# Patient Record
Sex: Male | Born: 1960
Health system: Southern US, Community
[De-identification: ages and names within clinical notes are randomized; demographics above are authoritative.]

## PROBLEM LIST (undated history)

## (undated) DIAGNOSIS — I4891 Unspecified atrial fibrillation: Principal | ICD-10-CM

## (undated) DIAGNOSIS — R918 Other nonspecific abnormal finding of lung field: Secondary | ICD-10-CM

## (undated) DIAGNOSIS — Z720 Tobacco use: Secondary | ICD-10-CM

## (undated) DIAGNOSIS — R0600 Dyspnea, unspecified: Secondary | ICD-10-CM

## (undated) DIAGNOSIS — R911 Solitary pulmonary nodule: Secondary | ICD-10-CM

## (undated) DIAGNOSIS — J449 Chronic obstructive pulmonary disease, unspecified: Secondary | ICD-10-CM

## (undated) DIAGNOSIS — F1010 Alcohol abuse, uncomplicated: Secondary | ICD-10-CM

## (undated) DIAGNOSIS — G8929 Other chronic pain: Secondary | ICD-10-CM

## (undated) DIAGNOSIS — J189 Pneumonia, unspecified organism: Secondary | ICD-10-CM

## (undated) DIAGNOSIS — M549 Dorsalgia, unspecified: Secondary | ICD-10-CM

## (undated) HISTORY — PX: WISDOM TOOTH EXTRACTION: SHX21

## (undated) HISTORY — DX: Other nonspecific abnormal finding of lung field: R91.8

## (undated) HISTORY — DX: Solitary pulmonary nodule: R91.1

## (undated) HISTORY — PX: APPENDECTOMY: SHX54

---

## 2001-08-09 ENCOUNTER — Emergency Department (HOSPITAL_COMMUNITY): Admission: EM | Admit: 2001-08-09 | Discharge: 2001-08-09 | Payer: Self-pay | Admitting: Emergency Medicine

## 2004-05-25 ENCOUNTER — Emergency Department (HOSPITAL_COMMUNITY): Admission: EM | Admit: 2004-05-25 | Discharge: 2004-05-25 | Payer: Self-pay | Admitting: Emergency Medicine

## 2005-04-25 ENCOUNTER — Emergency Department (HOSPITAL_COMMUNITY): Admission: EM | Admit: 2005-04-25 | Discharge: 2005-04-25 | Payer: Self-pay | Admitting: Emergency Medicine

## 2005-05-04 ENCOUNTER — Ambulatory Visit: Payer: Self-pay | Admitting: Internal Medicine

## 2005-05-26 ENCOUNTER — Ambulatory Visit: Payer: Self-pay | Admitting: *Deleted

## 2005-07-10 ENCOUNTER — Emergency Department (HOSPITAL_COMMUNITY): Admission: EM | Admit: 2005-07-10 | Discharge: 2005-07-10 | Payer: Self-pay | Admitting: Emergency Medicine

## 2014-06-12 ENCOUNTER — Emergency Department (HOSPITAL_COMMUNITY)
Admission: EM | Admit: 2014-06-12 | Discharge: 2014-06-12 | Disposition: A | Discharge status: Home / Self Care | Payer: Self-pay | Attending: Emergency Medicine | Admitting: Emergency Medicine

## 2014-06-12 ENCOUNTER — Encounter (HOSPITAL_COMMUNITY): Payer: Self-pay | Admitting: Emergency Medicine

## 2014-06-12 DIAGNOSIS — G8929 Other chronic pain: Secondary | ICD-10-CM | POA: Insufficient documentation

## 2014-06-12 DIAGNOSIS — Z720 Tobacco use: Secondary | ICD-10-CM | POA: Insufficient documentation

## 2014-06-12 DIAGNOSIS — M62830 Muscle spasm of back: Secondary | ICD-10-CM | POA: Insufficient documentation

## 2014-06-12 HISTORY — DX: Dorsalgia, unspecified: M54.9

## 2014-06-12 HISTORY — DX: Other chronic pain: G89.29

## 2014-06-12 MED ORDER — ACETAMINOPHEN 325 MG PO TABS
650.0000 mg | ORAL_TABLET | Freq: Once | ORAL | Status: AC
  Administered 2014-06-12: 650 mg via ORAL
  Filled 2014-06-12: qty 2

## 2014-06-12 MED ORDER — CYCLOBENZAPRINE HCL 10 MG PO TABS
5.0000 mg | ORAL_TABLET | Freq: Once | ORAL | Status: AC
  Administered 2014-06-12: 5 mg via ORAL
  Filled 2014-06-12: qty 1

## 2014-06-12 MED ORDER — MORPHINE SULFATE 4 MG/ML IJ SOLN
4.0000 mg | Freq: Once | INTRAMUSCULAR | Status: AC
  Administered 2014-06-12: 4 mg via INTRAMUSCULAR
  Filled 2014-06-12: qty 1

## 2014-06-12 MED ORDER — IBUPROFEN 800 MG PO TABS: 800.0000 mg | ORAL_TABLET | Freq: Three times a day (TID) | ORAL | Status: DC

## 2014-06-12 MED ORDER — CYCLOBENZAPRINE HCL 5 MG PO TABS: 10.0000 mg | ORAL_TABLET | Freq: Two times a day (BID) | ORAL | Status: DC | PRN

## 2014-06-12 MED ORDER — OXYCODONE-ACETAMINOPHEN 5-325 MG PO TABS: 1.0000 | ORAL_TABLET | Freq: Four times a day (QID) | ORAL | Status: DC | PRN

## 2014-06-12 MED ORDER — ONDANSETRON 4 MG PO TBDP
4.0000 mg | ORAL_TABLET | Freq: Once | ORAL | Status: AC
  Administered 2014-06-12: 4 mg via ORAL
  Filled 2014-06-12: qty 1

## 2014-06-12 NOTE — ED Notes (Signed)
Pt states that he has chronic low back pain x 10 years.  "I helped someone move last week and it got worse".

## 2014-06-12 NOTE — Discharge Instructions (Signed)
Heat Therapy Heat therapy can help ease sore, stiff, injured, and tight muscles and joints. Heat relaxes your muscles, which may help ease your pain.  RISKS AND COMPLICATIONS If you have any of the following conditions, do not use heat therapy unless your health care provider has approved:  Poor circulation.  Healing wounds or scarred skin in the area being treated.  Diabetes, heart disease, or high blood pressure.  Not being able to feel (numbness) the area being treated.  Unusual swelling of the area being treated.  Active infections.  Blood clots.  Cancer.  Inability to communicate pain. This may include young children and people who have problems with their brain function (dementia).  Pregnancy. Heat therapy should only be used on old, pre-existing, or long-lasting (chronic) injuries. Do not use heat therapy on new injuries unless directed by your health care provider. HOW TO USE HEAT THERAPY There are several different kinds of heat therapy, including:  Moist heat pack.  Warm water bath.  Hot water bottle.  Electric heating pad.  Heated gel pack.  Heated wrap.  Electric heating pad. Use the heat therapy method suggested by your health care provider. Follow your health care provider's instructions on when and how to use heat therapy. GENERAL HEAT THERAPY RECOMMENDATIONS  Do not sleep while using heat therapy. Only use heat therapy while you are awake.  Your skin may turn pink while using heat therapy. Do not use heat therapy if your skin turns red.  Do not use heat therapy if you have new pain.  High heat or long exposure to heat can cause burns. Be careful when using heat therapy to avoid burning your skin.  Do not use heat therapy on areas of your skin that are already irritated, such as with a rash or sunburn. SEEK MEDICAL CARE IF:  You have blisters, redness, swelling, or numbness.  You have new pain.  Your pain is worse. MAKE SURE  YOU:  Understand these instructions.  Will watch your condition.  Will get help right away if you are not doing well or get worse. Document Released: 09/20/2011 Document Revised: 11/12/2013 Document Reviewed: 08/21/2013 [facility] Patient Information 2015 [place], [place]. This information is not intended to replace advice given to you by your health care provider. Make sure you discuss any questions you have with your health care provider.  Muscle Cramps and Spasms Muscle cramps and spasms occur when a muscle or muscles tighten and you have no control over this tightening (involuntary muscle contraction). They are a common problem and can develop in any muscle. The most common place is in the calf muscles of the leg. Both muscle cramps and muscle spasms are involuntary muscle contractions, but they also have differences:   Muscle cramps are sporadic and painful. They may last a few seconds to a quarter of an hour. Muscle cramps are often more forceful and last longer than muscle spasms.  Muscle spasms may or may not be painful. They may also last just a few seconds or much longer. CAUSES  It is uncommon for cramps or spasms to be due to a serious underlying problem. In many cases, the cause of cramps or spasms is unknown. Some common causes are:   Overexertion.   Overuse from repetitive motions (doing the same thing over and over).   Remaining in a certain position for a long period of time.   Improper preparation, form, or technique while performing a sport or activity.   Dehydration.   Injury.  Side effects of some medicines.   Abnormally low levels of the salts and ions in your blood (electrolytes), especially potassium and calcium. This could happen if you are taking water pills (diuretics) or you are pregnant.  Some underlying medical problems can make it more likely to develop cramps or spasms. These include, but are not limited to:   Diabetes.   Parkinson disease.    Hormone disorders, such as thyroid problems.   Alcohol abuse.   Diseases specific to muscles, joints, and bones.   Blood vessel disease where not enough blood is getting to the muscles.  HOME CARE INSTRUCTIONS   Stay well hydrated. Drink enough water and fluids to keep your urine clear or pale yellow.  It may be helpful to massage, stretch, and relax the affected muscle.  For tight or tense muscles, use a warm towel, heating pad, or hot shower water directed to the affected area.  If you are sore or have pain after a cramp or spasm, applying ice to the affected area may relieve discomfort.  Put ice in a plastic bag.  Place a towel between your skin and the bag.  Leave the ice on for 15-20 minutes, 03-04 times a day.  Medicines used to treat a known cause of cramps or spasms may help reduce their frequency or severity. Only take over-the-counter or prescription medicines as directed by your caregiver. SEEK MEDICAL CARE IF:  Your cramps or spasms get more severe, more frequent, or do not improve over time.  MAKE SURE YOU:   Understand these instructions.  Will watch your condition.  Will get help right away if you are not doing well or get worse. Document Released: 12/18/2001 Document Revised: 10/23/2012 Document Reviewed: 06/14/2012 [facility] Patient Information 2015 [place], [place]. This information is not intended to replace advice given to you by your health care provider. Make sure you discuss any questions you have with your health care provider. Back Pain, Adult Low back pain is very common. About 1 in 5 people have back pain.The cause of low back pain is rarely dangerous. The pain often gets better over time.About half of people with a sudden onset of back pain feel better in just 2 weeks. About 8 in 10 people feel better by 6 weeks.  CAUSES Some common causes of back pain include:  Strain of the muscles or ligaments supporting the spine.  Wear and tear  (degeneration) of the spinal discs.  Arthritis.  Direct injury to the back. DIAGNOSIS Most of the time, the direct cause of low back pain is not known.However, back pain can be treated effectively even when the exact cause of the pain is unknown.Answering your caregiver's questions about your overall health and symptoms is one of the most accurate ways to make sure the cause of your pain is not dangerous. If your caregiver needs more information, he or she may order lab work or imaging tests (X-rays or MRIs).However, even if imaging tests show changes in your back, this usually does not require surgery. HOME CARE INSTRUCTIONS For many people, back pain returns.Since low back pain is rarely dangerous, it is often a condition that people can learn to [facility] their own.   Remain active. It is stressful on the back to sit or stand in one place. Do not sit, drive, or stand in one place for more than 30 minutes at a time. Take short walks on level surfaces as soon as pain allows.Try to increase the length of time you walk  each day.  Do not stay in bed.Resting more than 1 or 2 days can delay your recovery.  Do not avoid exercise or work.Your body is made to move.It is not dangerous to be active, even though your back may hurt.Your back will likely heal faster if you return to being active before your pain is gone.  Pay attention to your body when you bend and lift. Many people have less discomfortwhen lifting if they bend their knees, keep the load close to their bodies,and avoid twisting. Often, the most comfortable positions are those that put less stress on your recovering back.  Find a comfortable position to sleep. Use a firm mattress and lie on your side with your knees slightly bent. If you lie on your back, put a pillow under your knees.  Only take over-the-counter or prescription medicines as directed by your caregiver. Over-the-counter medicines to reduce pain and inflammation  are often the most helpful.Your caregiver may prescribe muscle relaxant drugs.These medicines help dull your pain so you can more quickly return to your normal activities and healthy exercise.  Put ice on the injured area.  Put ice in a plastic bag.  Place a towel between your skin and the bag.  Leave the ice on for 15-20 minutes, 03-04 times a day for the first 2 to 3 days. After that, ice and heat may be alternated to reduce pain and spasms.  Ask your caregiver about trying back exercises and gentle massage. This may be of some benefit.  Avoid feeling anxious or stressed.Stress increases muscle tension and can worsen back pain.It is important to recognize when you are anxious or stressed and learn ways to manage it.Exercise is a great option. SEEK MEDICAL CARE IF:  You have pain that is not relieved with rest or medicine.  You have pain that does not improve in 1 week.  You have new symptoms.  You are generally not feeling well. SEEK IMMEDIATE MEDICAL CARE IF:   You have pain that radiates from your back into your legs.  You develop new bowel or bladder control problems.  You have unusual weakness or numbness in your arms or legs.  You develop nausea or vomiting.  You develop abdominal pain.  You feel faint. Document Released: 06/28/2005 Document Revised: 12/28/2011 Document Reviewed: 10/30/2013 [facility] Patient Information 2015 [place], [place]. This information is not intended to replace advice given to you by your health care provider. Make sure you discuss any questions you have with your health care provider.

## 2014-06-12 NOTE — ED Provider Notes (Signed)
CSN: [id number]     Arrival date & time 06/12/14  1016 History  This chart was scribed for non-physician practitioner, Delos Haring, PA-C working with Virgel Manifold, MD by Frederich Balding, ED scribe. This patient was seen in room WTR8/WTR8 and the patient's care was started at 12:03 PM.   Chief Complaint  Patient presents with  . Back Pain   The history is provided by the patient. No language interpreter was used.    HPI Comments: Tyler [name] is a 53 y.o. male who presents to the Emergency Department complaining of chronic lower back pain that worsened one week ago. Denies fall or injury but states he was doing heavy lifting to help someone move the day before the pain started. Pt does not normally use a cane to ambulate but has been using one due to pain. Movements worsen the pain and cause it to radiate into his right thigh. He has taken ibuprofen with no relief. Denies bowel or bladder incontinence, weakness, numbness.   Past Medical History  Diagnosis Date  . Chronic back pain    History reviewed. No pertinent past surgical history. History reviewed. No pertinent family history. History  Substance Use Topics  . Smoking status: Current Every Day Smoker  . Smokeless tobacco: Not on file  . Alcohol Use: Yes    Review of Systems  Genitourinary:       Negative for bowel or bladder incontinence.  Musculoskeletal: Positive for back pain.  Neurological: Negative for weakness and numbness.  All other systems reviewed and are negative.  Allergies  Review of patient's allergies indicates no known allergies.  Home Medications   Prior to Admission medications   Medication Sig Start Date End Date Taking? Authorizing Provider  cyclobenzaprine (FLEXERIL) 5 MG tablet Take 2 tablets (10 mg total) by mouth 2 (two) times daily as needed for muscle spasms. 06/12/14   Aaryan Essman Marilu Favre, PA-C  ibuprofen (ADVIL,MOTRIN) 800 MG tablet Take 1 tablet (800 mg total) by mouth 3 (three) times daily.  06/12/14   Dauntae Derusha Marilu Favre, PA-C  oxyCODONE-acetaminophen (PERCOCET/ROXICET) 5-325 MG per tablet Take 1 tablet by mouth every 6 (six) hours as needed for severe pain. 06/12/14   Cheyne Boulden Marilu Favre, PA-C   BP 146/73 mmHg  Pulse 73  Temp(Src) 98.1 F (36.7 C) (Oral)  Resp 16  SpO2 100%   Physical Exam  Constitutional: He is oriented to person, place, and time. He appears well-developed and well-nourished. No distress.  HENT:  Head: Normocephalic and atraumatic.  Eyes: Conjunctivae and EOM are normal.  Neck: Neck supple. No tracheal deviation present.  Cardiovascular: Normal rate.   Pulmonary/Chest: Effort normal. No respiratory distress.  Musculoskeletal: Normal range of motion.  Initial exam: Neurosensory function adequate to both legs No clonus on dorsiflextion Skin color is normal. Skin is warm and moist.  I see no step off deformity, no midline bony tenderness.  Pt is able to ambulate with a cane  No crepitus, laceration, effusion, induration, lesions, swelling.   Pedal pulses are symmetrical and palpable bilaterally  moderate tenderness to palpation of right side paraspinel muscles Pt has decreased strength to right lower extremity due to pain . Re-examination:  patient is able to ambulate without cane normally. He has symmetrical strengths to bilateral lower extremities.  Neurological: He is alert and oriented to person, place, and time.  Skin: Skin is warm and dry.  Psychiatric: He has a normal mood and affect. His behavior is normal.  Nursing note and vitals  reviewed.   ED Course  Procedures (including critical care time)  DIAGNOSTIC STUDIES: Oxygen Saturation is 100% on RA, normal by my interpretation.    COORDINATION OF CARE: 12:07 PM-Discussed treatment plan which includes pain medication, a muscle relaxer and an anti-inflammatory with pt at bedside and pt agreed to plan.   Labs Review Labs Reviewed - No data to display  Imaging Review No results found.    EKG Interpretation None      MDM   Final diagnoses:  Muscle spasm of back    Medications  morphine 4 MG/ML injection 4 mg (4 mg Intramuscular Given 06/12/14 1221)  cyclobenzaprine (FLEXERIL) tablet 5 mg (5 mg Oral Given 06/12/14 1221)  acetaminophen (TYLENOL) tablet 650 mg (650 mg Oral Given 06/12/14 1220)  ondansetron (ZOFRAN-ODT) disintegrating tablet 4 mg (4 mg Oral Given 06/12/14 1221)   After medication the pain no longer needed to walk with a cane and was feeling significantly better. No longer having weakness in the right leg.  53 y.o.Tyler [name] Tyler [name]'s  with back pain. No neurological deficits and normal neuro exam. Patient can walk. No loss of bowel or bladder control. No concern for cauda equina at this time base on HPI and physical exam findings. No fever, night sweats, weight loss, h/o cancer, IVDU.   RICE protocol and pain medicine indicated and discussed with patient.   Patient Plan 1. Medications: pain medication and muscle relaxer. Cont usual home medications unless otherwise directed. 2. Treatment: rest, drink plenty of fluids, gentle stretching as discussed, alternate ice and heat  3. Follow Up: Please followup with your primary doctor for discussion of your diagnoses and further evaluation after today's visit; if you do not have a primary care doctor use the resource guide provided to find one  Advised to follow-up with the orthopedist if symptoms do not start to resolve in the next 2-3 days. If develop loss of bowel or urinary control return to the ED as soon as possible for further evaluation. To take the medications as prescribed as they can cause harm if not taken appropriately.   Vital signs are stable at discharge. Filed Vitals:   06/12/14 1028  BP: 146/73  Pulse: 73  Temp: 98.1 F (36.7 C)  Resp: 16    Patient/guardian has voiced understanding and agreed to follow-up with the PCP or specialist.       I personally performed the services described in  this documentation, which was scribed in my presence. The recorded information has been reviewed and is accurate.  Linus Mako, PA-C 06/12/14 1312  Virgel Manifold, MD 06/12/14 [phone]

## 2015-07-10 ENCOUNTER — Encounter (HOSPITAL_COMMUNITY): Payer: Self-pay | Admitting: Emergency Medicine

## 2015-07-10 ENCOUNTER — Emergency Department (HOSPITAL_COMMUNITY)
Admission: EM | Admit: 2015-07-10 | Discharge: 2015-07-10 | Disposition: A | Discharge status: Home / Self Care | Payer: Self-pay | Attending: Emergency Medicine | Admitting: Emergency Medicine

## 2015-07-10 DIAGNOSIS — Z791 Long term (current) use of non-steroidal anti-inflammatories (NSAID): Secondary | ICD-10-CM | POA: Insufficient documentation

## 2015-07-10 DIAGNOSIS — H9211 Otorrhea, right ear: Secondary | ICD-10-CM | POA: Insufficient documentation

## 2015-07-10 DIAGNOSIS — F17200 Nicotine dependence, unspecified, uncomplicated: Secondary | ICD-10-CM | POA: Insufficient documentation

## 2015-07-10 DIAGNOSIS — H66011 Acute suppurative otitis media with spontaneous rupture of ear drum, right ear: Secondary | ICD-10-CM | POA: Insufficient documentation

## 2015-07-10 DIAGNOSIS — G8929 Other chronic pain: Secondary | ICD-10-CM | POA: Insufficient documentation

## 2015-07-10 MED ORDER — OFLOXACIN 0.3 % OP SOLN
5.0000 [drp] | Freq: Every day | OPHTHALMIC | Status: DC
  Administered 2015-07-10: 5 [drp] via OTIC
  Filled 2015-07-10: qty 5

## 2015-07-10 MED ORDER — AMOXICILLIN 875 MG PO TABS: 875.0000 mg | ORAL_TABLET | Freq: Two times a day (BID) | ORAL | Status: DC

## 2015-07-10 NOTE — Discharge Instructions (Signed)
Please read and follow all provided instructions.  Your diagnoses today include:  1. Otorrhea of right ear    Tests performed today include:  Vital signs. See below for your results today.   Medications prescribed:   Ofloxacin (ear drops) - Use 10 drops in affected ear twice a day for 14 days   Amoxicillin - antibiotic  You have been prescribed an antibiotic medicine: take the entire course of medicine even if you are feeling better. Stopping early can cause the antibiotic not to work.   Take any prescribed medications only as directed. Treatment for your infection is aimed at treating the symptoms. There are no medications, such as antibiotics, that will cure your infection.   Home care instructions:  Follow any educational materials contained in this packet.   Follow-up instructions: Please follow-up with the ear doctor referral listed in the next 5 days to have your ear rechecked.  Return instructions:   Please return to the Emergency Department if you experience worsening symptoms.   RETURN IMMEDIATELY IF you develop shortness of breath, confusion or altered mental status, a new rash, become dizzy, faint, or poorly responsive, or are unable to be cared for at home.  Please return if you have persistent vomiting and cannot keep down fluids or develop a fever that is not controlled by tylenol or motrin.    Please return if you have any other emergent concerns.  Additional Information:  Your vital signs today were: BP 124/86 mmHg   Pulse 77   Temp(Src) 98.3 F (36.8 C) (Oral)   Resp 14   SpO2 100% If your blood pressure (BP) was elevated above 135/85 this visit, please have this repeated by your doctor within one month. --------------

## 2015-07-10 NOTE — ED Provider Notes (Signed)
CSN: [id number]     Arrival date & time 07/10/15  3532 History   First MD Initiated Contact with Patient 07/10/15 1021     Chief Complaint  Patient presents with  . Ear Drainage     (Consider location/radiation/quality/duration/timing/severity/associated sxs/prior Treatment) HPI Comments: Patient presents with complaint of right-sided hearing loss and otorrhea starting yesterday morning. Patient was out raking leaves and all of a sudden felt dripping from his right ear. He used a cotton ball and found that the drainage was mostly blood. Patient noted decrease in hearing about that time. No significant pain, fever, URI symptoms. No treatments for this. Patient denies trauma to the head or ear. Onset of symptoms acute. Course is constant. Nothing makes symptoms better or worse.   The history is provided by the patient.    Past Medical History  Diagnosis Date  . Chronic back pain    History reviewed. No pertinent past surgical history. History reviewed. No pertinent family history. Social History  Substance Use Topics  . Smoking status: Current Every Day Smoker  . Smokeless tobacco: None  . Alcohol Use: Yes    Review of Systems  Constitutional: Negative for fever.  HENT: Positive for ear discharge (blood) and hearing loss. Negative for congestion, rhinorrhea and sore throat.   Eyes: Negative for redness.  Respiratory: Negative for cough.   Cardiovascular: Negative for chest pain.  Gastrointestinal: Negative for nausea, vomiting, abdominal pain and diarrhea.  Genitourinary: Negative for dysuria.  Musculoskeletal: Negative for myalgias.  Skin: Negative for rash.  Neurological: Negative for headaches.      Allergies  Review of patient's allergies indicates no known allergies.  Home Medications   Prior to Admission medications   Medication Sig Start Date End Date Taking? Authorizing Provider  cyclobenzaprine (FLEXERIL) 5 MG tablet Take 2 tablets (10 mg total) by mouth 2  (two) times daily as needed for muscle spasms. 06/12/14   Tiffany Carlota Raspberry, PA-C  ibuprofen (ADVIL,MOTRIN) 800 MG tablet Take 1 tablet (800 mg total) by mouth 3 (three) times daily. 06/12/14   Delos Haring, PA-C  oxyCODONE-acetaminophen (PERCOCET/ROXICET) 5-325 MG per tablet Take 1 tablet by mouth every 6 (six) hours as needed for severe pain. 06/12/14   Tiffany Carlota Raspberry, PA-C   BP 124/86 mmHg  Pulse 77  Temp(Src) 98.3 F (36.8 C) (Oral)  Resp 14  SpO2 100%   Physical Exam  Constitutional: He appears well-developed and well-nourished.  HENT:  Head: Normocephalic and atraumatic.  Right Ear: Tympanic membrane is bulging.  Left Ear: Tympanic membrane, external ear and ear canal normal.  Nose: Nose normal. No mucosal edema or rhinorrhea.  Mouth/Throat: Uvula is midline, oropharynx is clear and moist and mucous membranes are normal. Mucous membranes are not dry. No trismus in the jaw. No uvula swelling. No oropharyngeal exudate, posterior oropharyngeal edema, posterior oropharyngeal erythema or tonsillar abscesses.  Dried blood and debris within the right ear canal with mild edema. The TM that I see, appears bulging, with purulent fluid. I cannot fully assess for perforation. Technically difficult exam.  Eyes: Conjunctivae are normal. Right eye exhibits no discharge. Left eye exhibits no discharge.  Neck: Normal range of motion. Neck supple.  Cardiovascular: Normal rate, regular rhythm and normal heart sounds.   Pulmonary/Chest: Effort normal and breath sounds normal. No respiratory distress. He has no wheezes. He has no rales.  Abdominal: Soft. There is no tenderness.  Neurological: He is alert.  Skin: Skin is warm and dry.  Psychiatric: He has a normal  mood and affect.  Nursing note and vitals reviewed.   ED Course  Procedures (including critical care time) Labs Review Labs Reviewed - No data to display  Imaging Review No results found. I have personally reviewed and evaluated these  images and lab results as part of my medical decision-making.   EKG Interpretation None       10:51 AM Patient has not yet been roomed.   Vital signs reviewed and are as follows: BP 124/86 mmHg  Pulse 77  Temp(Src) 98.3 F (36.8 C) (Oral)  Resp 14  SpO2 100%  11:07 AM Patient seen and examined. Medications ordered.   Will treat for otitis with amoxicillin. As I cannot rule out perforation, will also cover with ofloxacin.   ENT follow-up given and patient encouraged to follow-up to ensure resolution of current symptoms.    MDM   Final diagnoses:  Otorrhea of right ear  Acute suppurative otitis media of right ear with spontaneous rupture of tympanic membrane, recurrence not specified   Patient with exam most consistent with suppurative otitis media with perforation. This would explain otorrhea and decreased hearing. Patient denies trauma. Otherwise appears well. No history of diabetes. Do not suspect malignant otitis externa.   Carlisle Cater, PA-C 07/10/15 [place], MD 07/12/15 [phone]

## 2015-07-10 NOTE — ED Notes (Addendum)
Pt c/o right ear pain and bleeding, headache onset 1100 yesterday. Dried gelatinous blood present to inner ear obstructing clear view of tympanic membrane. No current fresh drainage of fluid or blood visualized. Denies trauma.

## 2017-08-03 ENCOUNTER — Emergency Department (HOSPITAL_COMMUNITY): Payer: Self-pay

## 2017-08-03 ENCOUNTER — Inpatient Hospital Stay (HOSPITAL_COMMUNITY)
Admission: EM | Admit: 2017-08-03 | Discharge: 2017-08-11 | DRG: 193 | Disposition: A | Discharge status: Home / Self Care | Payer: Self-pay | Attending: Internal Medicine | Admitting: Internal Medicine

## 2017-08-03 ENCOUNTER — Encounter (HOSPITAL_COMMUNITY): Payer: Self-pay | Admitting: Family Medicine

## 2017-08-03 DIAGNOSIS — J1000 Influenza due to other identified influenza virus with unspecified type of pneumonia: Principal | ICD-10-CM | POA: Diagnosis present

## 2017-08-03 DIAGNOSIS — I471 Supraventricular tachycardia: Secondary | ICD-10-CM | POA: Diagnosis present

## 2017-08-03 DIAGNOSIS — J8489 Other specified interstitial pulmonary diseases: Secondary | ICD-10-CM | POA: Diagnosis present

## 2017-08-03 DIAGNOSIS — F17210 Nicotine dependence, cigarettes, uncomplicated: Secondary | ICD-10-CM | POA: Diagnosis present

## 2017-08-03 DIAGNOSIS — G8929 Other chronic pain: Secondary | ICD-10-CM | POA: Diagnosis present

## 2017-08-03 DIAGNOSIS — R0902 Hypoxemia: Secondary | ICD-10-CM | POA: Diagnosis present

## 2017-08-03 DIAGNOSIS — S27309A Unspecified injury of lung, unspecified, initial encounter: Secondary | ICD-10-CM

## 2017-08-03 DIAGNOSIS — J440 Chronic obstructive pulmonary disease with acute lower respiratory infection: Secondary | ICD-10-CM | POA: Diagnosis present

## 2017-08-03 DIAGNOSIS — T380X5A Adverse effect of glucocorticoids and synthetic analogues, initial encounter: Secondary | ICD-10-CM | POA: Diagnosis not present

## 2017-08-03 DIAGNOSIS — D72829 Elevated white blood cell count, unspecified: Secondary | ICD-10-CM | POA: Diagnosis not present

## 2017-08-03 DIAGNOSIS — J111 Influenza due to unidentified influenza virus with other respiratory manifestations: Secondary | ICD-10-CM

## 2017-08-03 DIAGNOSIS — E871 Hypo-osmolality and hyponatremia: Secondary | ICD-10-CM | POA: Diagnosis present

## 2017-08-03 DIAGNOSIS — J9601 Acute respiratory failure with hypoxia: Secondary | ICD-10-CM | POA: Diagnosis present

## 2017-08-03 DIAGNOSIS — E872 Acidosis: Secondary | ICD-10-CM | POA: Diagnosis present

## 2017-08-03 DIAGNOSIS — A084 Viral intestinal infection, unspecified: Secondary | ICD-10-CM | POA: Diagnosis present

## 2017-08-03 DIAGNOSIS — R945 Abnormal results of liver function studies: Secondary | ICD-10-CM | POA: Diagnosis present

## 2017-08-03 DIAGNOSIS — J189 Pneumonia, unspecified organism: Secondary | ICD-10-CM

## 2017-08-03 DIAGNOSIS — J101 Influenza due to other identified influenza virus with other respiratory manifestations: Secondary | ICD-10-CM | POA: Diagnosis present

## 2017-08-03 DIAGNOSIS — R197 Diarrhea, unspecified: Secondary | ICD-10-CM | POA: Diagnosis present

## 2017-08-03 DIAGNOSIS — I071 Rheumatic tricuspid insufficiency: Secondary | ICD-10-CM | POA: Diagnosis present

## 2017-08-03 DIAGNOSIS — F1010 Alcohol abuse, uncomplicated: Secondary | ICD-10-CM | POA: Diagnosis present

## 2017-08-03 DIAGNOSIS — E43 Unspecified severe protein-calorie malnutrition: Secondary | ICD-10-CM | POA: Diagnosis present

## 2017-08-03 DIAGNOSIS — E860 Dehydration: Secondary | ICD-10-CM | POA: Diagnosis present

## 2017-08-03 DIAGNOSIS — J181 Lobar pneumonia, unspecified organism: Secondary | ICD-10-CM

## 2017-08-03 DIAGNOSIS — R296 Repeated falls: Secondary | ICD-10-CM | POA: Diagnosis present

## 2017-08-03 DIAGNOSIS — Z8249 Family history of ischemic heart disease and other diseases of the circulatory system: Secondary | ICD-10-CM

## 2017-08-03 DIAGNOSIS — Z9981 Dependence on supplemental oxygen: Secondary | ICD-10-CM

## 2017-08-03 DIAGNOSIS — Z681 Body mass index (BMI) 19 or less, adult: Secondary | ICD-10-CM

## 2017-08-03 DIAGNOSIS — R000 Tachycardia, unspecified: Secondary | ICD-10-CM | POA: Diagnosis present

## 2017-08-03 HISTORY — DX: Tobacco use: Z72.0

## 2017-08-03 HISTORY — DX: Alcohol abuse, uncomplicated: F10.10

## 2017-08-03 LAB — COMPREHENSIVE METABOLIC PANEL
ALBUMIN: 4 g/dL (ref 3.5–5.0)
ALK PHOS: 69 U/L (ref 38–126)
ALT: 66 U/L — ABNORMAL HIGH (ref 17–63)
AST: 183 U/L — ABNORMAL HIGH (ref 15–41)
Anion gap: 14 (ref 5–15)
BUN: 24 mg/dL — ABNORMAL HIGH (ref 6–20)
CALCIUM: 9.4 mg/dL (ref 8.9–10.3)
CHLORIDE: 91 mmol/L — AB (ref 101–111)
CO2: 23 mmol/L (ref 22–32)
Creatinine, Ser: 1.21 mg/dL (ref 0.61–1.24)
GFR calc non Af Amer: >60 mL/min (ref >60)
GLUCOSE: 126 mg/dL — AB (ref 65–99)
Potassium: 3.5 mmol/L (ref 3.5–5.1)
Sodium: 128 mmol/L — ABNORMAL LOW (ref 135–145)
Total Bilirubin: 0.7 mg/dL (ref 0.3–1.2)
Total Protein: 8.8 g/dL — ABNORMAL HIGH (ref 6.5–8.1)

## 2017-08-03 LAB — CBC
HCT: 39.7 % (ref 39.0–52.0)
HEMOGLOBIN: 14.1 g/dL (ref 13.0–17.0)
MCH: 24.8 pg — ABNORMAL LOW (ref 26.0–34.0)
MCHC: 35.5 g/dL (ref 30.0–36.0)
MCV: 69.9 fL — AB (ref 78.0–100.0)
Platelets: 174 10*3/uL (ref 150–400)
RBC: 5.68 MIL/uL (ref 4.22–5.81)
RDW: 14.2 % (ref 11.5–15.5)
WBC: 13 10*3/uL — ABNORMAL HIGH (ref 4.0–10.5)

## 2017-08-03 LAB — INFLUENZA PANEL BY PCR (TYPE A & B)
Influenza A By PCR: POSITIVE — AB
Influenza B By PCR: NEGATIVE

## 2017-08-03 LAB — LIPASE, BLOOD: LIPASE: 62 U/L — AB (ref 11–51)

## 2017-08-03 LAB — LACTIC ACID, PLASMA: Lactic Acid, Venous: 2.4 mmol/L (ref 0.5–1.9)

## 2017-08-03 MED ORDER — DEXTROSE 5 % IV SOLN
2.0000 g | Freq: Once | INTRAVENOUS | Status: AC
  Administered 2017-08-03: 2 g via INTRAVENOUS
  Filled 2017-08-03: qty 2

## 2017-08-03 MED ORDER — SODIUM CHLORIDE 0.9 % IV BOLUS (SEPSIS)
1000.0000 mL | Freq: Once | INTRAVENOUS | Status: AC
  Administered 2017-08-03: 1000 mL via INTRAVENOUS

## 2017-08-03 MED ORDER — DEXTROSE 5 % IV SOLN
500.0000 mg | Freq: Once | INTRAVENOUS | Status: AC
  Administered 2017-08-04: 500 mg via INTRAVENOUS
  Filled 2017-08-03: qty 500

## 2017-08-03 NOTE — ED Triage Notes (Signed)
Patient reports he is experiencing flu like symptoms that started Monday. Symptoms include: body aches, unmeasured, generalized weakness, cough, and loss of appetite. Patient reports on Monday he took Nyquil with relief the first time. Also, reports nausea and vomiting.

## 2017-08-03 NOTE — ED Provider Notes (Signed)
[facility] DEPT Provider Note   CSN: [id number] Arrival date & time: 08/03/17  1719     History   Chief Complaint Chief Complaint  Patient presents with  . Influenza    HPI Tyler [name] is a 57 y.o. male.  Patient with no significant medical history presents with generalized body aches, generalized weakness causing falls, intermittent fever, congestion, cough, nausea, vomiting and diarrhea x 3 days. He has no appetite and has not been taking in fluids. He is urinating less. Family member reports multiple falls in the last 2 days secondary to weakness.    The history is provided by the patient. No language interpreter was used.  Influenza  Presenting symptoms: cough, diarrhea, fever, nausea and vomiting   Associated symptoms: chills and nasal congestion   Associated symptoms: no neck stiffness     Past Medical History:  Diagnosis Date  . Chronic back pain     There are no active problems to display for this patient.   History reviewed. No pertinent surgical history.     Home Medications    Prior to Admission medications   Medication Sig Start Date End Date Taking? Authorizing Provider  Phenyleph-Doxylamine-DM-APAP (NYQUIL SEVERE COLD/FLU) 5-6.25-10-325 MG CAPS Take 2 capsules by mouth daily as needed (COLD).   Yes [provider]  amoxicillin (AMOXIL) 875 MG tablet Take 1 tablet (875 mg total) by mouth 2 (two) times daily. Patient not taking: Reported on 08/03/2017 07/10/15   Carlisle Cater, PA-C  cyclobenzaprine (FLEXERIL) 5 MG tablet Take 2 tablets (10 mg total) by mouth 2 (two) times daily as needed for muscle spasms. Patient not taking: Reported on 08/03/2017 06/12/14   Delos Haring, PA-C  ibuprofen (ADVIL,MOTRIN) 800 MG tablet Take 1 tablet (800 mg total) by mouth 3 (three) times daily. Patient not taking: Reported on 08/03/2017 06/12/14   Delos Haring, PA-C  oxyCODONE-acetaminophen (PERCOCET/ROXICET) 5-325 MG per  tablet Take 1 tablet by mouth every 6 (six) hours as needed for severe pain. Patient not taking: Reported on 08/03/2017 06/12/14   Delos Haring, PA-C    Family History History reviewed. No pertinent family history.  Social History Social History   Tobacco Use  . Smoking status: Current Every Day Smoker    Packs/day: 0.50  . Smokeless tobacco: Never Used  Substance Use Topics  . Alcohol use: Yes    Comment: Drinks heavy daily.   . Drug use: Yes    Types: Marijuana     Allergies   Patient has no known allergies.   Review of Systems Review of Systems  Constitutional: Positive for activity change, appetite change, chills and fever.  HENT: Positive for congestion. Negative for facial swelling and trouble swallowing.   Respiratory: Positive for cough.   Cardiovascular: Negative for chest pain.  Gastrointestinal: Positive for diarrhea, nausea and vomiting.  Genitourinary: Positive for decreased urine volume.  Musculoskeletal: Positive for arthralgias. Negative for neck stiffness.  Skin: Negative for rash.  Neurological: Positive for weakness.  Psychiatric/Behavioral: Negative for confusion.     Physical Exam Updated Vital Signs BP (!) 122/95 (BP Location: Left Arm)   Pulse 79   Temp 98.5 F (36.9 C) (Oral)   Resp 20   Ht 6\' 2"  (1.88 m)   Wt 77.6 kg (171 lb)   SpO2 98%   BMI 21.96 kg/m   Physical Exam  Constitutional: He is oriented to person, place, and time. He appears well-developed and well-nourished. No distress.  Ill appearing.  HENT:  Head: Normocephalic.  Mouth/Throat: Mucous membranes are dry.  Eyes: Conjunctivae are normal.  Neck: Normal range of motion. Neck supple.  Cardiovascular: Normal rate and regular rhythm.  No murmur heard. Pulmonary/Chest: Effort normal and breath sounds normal. He has no wheezes. He has no rales.  Abdominal: Soft. Bowel sounds are normal. There is no tenderness. There is no rebound and no guarding.  Musculoskeletal:  Normal range of motion.  Neurological: He is alert and oriented to person, place, and time. No sensory deficit. He exhibits normal muscle tone. Coordination normal.  Skin: Skin is warm and dry. No rash noted.  Psychiatric: He has a normal mood and affect.  Nursing note and vitals reviewed.    ED Treatments / Results  Labs (all labs ordered are listed, but only abnormal results are displayed) Labs Reviewed  LIPASE, BLOOD - Abnormal; Notable for the following components:      Result Value   Lipase 62 (*)    All other components within normal limits  COMPREHENSIVE METABOLIC PANEL - Abnormal; Notable for the following components:   Sodium 128 (*)    Chloride 91 (*)    Glucose, Bld 126 (*)    BUN 24 (*)    Total Protein 8.8 (*)    AST 183 (*)    ALT 66 (*)    All other components within normal limits  CBC - Abnormal; Notable for the following components:   WBC 13.0 (*)    MCV 69.9 (*)    MCH 24.8 (*)    All other components within normal limits  URINALYSIS, ROUTINE W REFLEX MICROSCOPIC    EKG  EKG Interpretation None       Radiology No results found.  Procedures Procedures (including critical care time)  Medications Ordered in ED Medications  sodium chloride 0.9 % bolus 1,000 mL (not administered)     Initial Impression / Assessment and Plan / ED Course  I have reviewed the triage vital signs and the nursing notes.  Pertinent labs & imaging results that were available during my care of the patient were reviewed by me and considered in my medical decision making (see chart for details).     Patient presents with flu like symptoms and profound generalized weakness causing falls. He is a smoker. He is ill appearing.  Labs show hyponatremia (128), low chloride. Dehydration likely. No focal neurologic deficits. Differential includes influenza. Influenza test ordered as admission is felt likely. IVF's, CXR, add on labs pending.   CXR shows LLL infiltrate. Patient  started on Rocephin and Zithromax. On recheck, no vomiting. He continues to have diarrhea - nonbloody. Fluids running. VSS.  Influenza A positive. Given the multiple conditions present, weakness leading to multiple falls, dehydration, patient will be admitted for overnight observation, continued hydration and antibiotics. Discussed with hospitalist who accepts the patient for admission.  Final Clinical Impressions(s) / ED Diagnoses   Final diagnoses:  None   1. LLL infiltrate 2. Influenza A 3. Dehydration  ED Discharge Orders    None       Charlann Lange, PA-C 08/04/17 0029    Daleen Bo, MD 08/04/17 [phone]

## 2017-08-04 ENCOUNTER — Other Ambulatory Visit: Payer: Self-pay

## 2017-08-04 ENCOUNTER — Encounter (HOSPITAL_COMMUNITY): Payer: Self-pay | Admitting: Internal Medicine

## 2017-08-04 DIAGNOSIS — J189 Pneumonia, unspecified organism: Secondary | ICD-10-CM | POA: Diagnosis present

## 2017-08-04 DIAGNOSIS — E860 Dehydration: Secondary | ICD-10-CM

## 2017-08-04 DIAGNOSIS — J181 Lobar pneumonia, unspecified organism: Secondary | ICD-10-CM

## 2017-08-04 DIAGNOSIS — E871 Hypo-osmolality and hyponatremia: Secondary | ICD-10-CM

## 2017-08-04 DIAGNOSIS — R000 Tachycardia, unspecified: Secondary | ICD-10-CM

## 2017-08-04 DIAGNOSIS — J101 Influenza due to other identified influenza virus with other respiratory manifestations: Secondary | ICD-10-CM | POA: Diagnosis present

## 2017-08-04 LAB — COMPREHENSIVE METABOLIC PANEL
ALBUMIN: 2.9 g/dL — AB (ref 3.5–5.0)
ALK PHOS: 49 U/L (ref 38–126)
ALT: 46 U/L (ref 17–63)
AST: 117 U/L — AB (ref 15–41)
Anion gap: 10 (ref 5–15)
BILIRUBIN TOTAL: 0.7 mg/dL (ref 0.3–1.2)
BUN: 15 mg/dL (ref 6–20)
CALCIUM: 8.1 mg/dL — AB (ref 8.9–10.3)
CO2: 24 mmol/L (ref 22–32)
CREATININE: 0.72 mg/dL (ref 0.61–1.24)
Chloride: 97 mmol/L — ABNORMAL LOW (ref 101–111)
GFR calc Af Amer: >60 mL/min (ref >60)
GLUCOSE: 102 mg/dL — AB (ref 65–99)
Potassium: 3.4 mmol/L — ABNORMAL LOW (ref 3.5–5.1)
Sodium: 131 mmol/L — ABNORMAL LOW (ref 135–145)
TOTAL PROTEIN: 7 g/dL (ref 6.5–8.1)

## 2017-08-04 LAB — URINALYSIS, ROUTINE W REFLEX MICROSCOPIC
BACTERIA UA: NONE SEEN
BILIRUBIN URINE: NEGATIVE
Glucose, UA: NEGATIVE mg/dL
Ketones, ur: NEGATIVE mg/dL
Leukocytes, UA: NEGATIVE
Nitrite: NEGATIVE
PROTEIN: 100 mg/dL — AB
Specific Gravity, Urine: 1.019 (ref 1.005–1.030)
pH: 5 (ref 5.0–8.0)

## 2017-08-04 LAB — TROPONIN I
TROPONIN I: 0.03 ng/mL — AB (ref <0.03)
Troponin I: 0.03 ng/mL (ref <0.03)

## 2017-08-04 LAB — LACTIC ACID, PLASMA
Lactic Acid, Venous: 3.1 mmol/L (ref 0.5–1.9)
Lactic Acid, Venous: 1.3 mmol/L (ref 0.5–1.9)

## 2017-08-04 LAB — C DIFFICILE QUICK SCREEN W PCR REFLEX
C DIFFICILE (CDIFF) INTERP: NOT DETECTED
C DIFFICILE (CDIFF) TOXIN: NEGATIVE
C DIFFICLE (CDIFF) ANTIGEN: NEGATIVE

## 2017-08-04 LAB — HIV ANTIBODY (ROUTINE TESTING W REFLEX): HIV Screen 4th Generation wRfx: NONREACTIVE

## 2017-08-04 LAB — CBC WITH DIFFERENTIAL/PLATELET
BASOS ABS: 0 10*3/uL (ref 0.0–0.1)
Basophils Relative: 0 %
EOS ABS: 0 10*3/uL (ref 0.0–0.7)
Eosinophils Relative: 0 %
HEMATOCRIT: 34.7 % — AB (ref 39.0–52.0)
Hemoglobin: 12.1 g/dL — ABNORMAL LOW (ref 13.0–17.0)
LYMPHS ABS: 1.6 10*3/uL (ref 0.7–4.0)
Lymphocytes Relative: 18 %
MCH: 24.2 pg — ABNORMAL LOW (ref 26.0–34.0)
MCHC: 34.9 g/dL (ref 30.0–36.0)
MCV: 69.4 fL — ABNORMAL LOW (ref 78.0–100.0)
MONO ABS: 0.5 10*3/uL (ref 0.1–1.0)
MONOS PCT: 6 %
NEUTROS ABS: 6.8 10*3/uL (ref 1.7–7.7)
Neutrophils Relative %: 76 %
PLATELETS: 137 10*3/uL — AB (ref 150–400)
RBC: 5 MIL/uL (ref 4.22–5.81)
RDW: 14.3 % (ref 11.5–15.5)
WBC: 8.9 10*3/uL (ref 4.0–10.5)

## 2017-08-04 LAB — MAGNESIUM: Magnesium: 1.7 mg/dL (ref 1.7–2.4)

## 2017-08-04 LAB — LIPASE, BLOOD: LIPASE: 72 U/L — AB (ref 11–51)

## 2017-08-04 LAB — STREP PNEUMONIAE URINARY ANTIGEN: STREP PNEUMO URINARY ANTIGEN: NEGATIVE

## 2017-08-04 MED ORDER — LEVALBUTEROL HCL 0.63 MG/3ML IN NEBU
0.6300 mg | INHALATION_SOLUTION | Freq: Four times a day (QID) | RESPIRATORY_TRACT | Status: DC
  Administered 2017-08-04: 0.63 mg via RESPIRATORY_TRACT
  Filled 2017-08-04: qty 3

## 2017-08-04 MED ORDER — PANTOPRAZOLE SODIUM 40 MG PO TBEC
40.0000 mg | DELAYED_RELEASE_TABLET | Freq: Every day | ORAL | Status: DC
  Administered 2017-08-04 – 2017-08-11 (×9): 40 mg via ORAL
  Filled 2017-08-04 (×8): qty 1

## 2017-08-04 MED ORDER — LEVALBUTEROL HCL 0.63 MG/3ML IN NEBU: 0.6300 mg | INHALATION_SOLUTION | RESPIRATORY_TRACT | Status: DC | PRN

## 2017-08-04 MED ORDER — SODIUM CHLORIDE 0.9 % IV SOLN
INTRAVENOUS | Status: DC
  Administered 2017-08-04 – 2017-08-06 (×5): via INTRAVENOUS

## 2017-08-04 MED ORDER — AZITHROMYCIN 500 MG IV SOLR
500.0000 mg | INTRAVENOUS | Status: DC
Start: 2017-08-04 — End: 2017-08-07
  Administered 2017-08-05 – 2017-08-06 (×3): 500 mg via INTRAVENOUS
  Filled 2017-08-04 (×3): qty 500

## 2017-08-04 MED ORDER — SODIUM CHLORIDE 0.9 % IV BOLUS (SEPSIS)
1000.0000 mL | Freq: Once | INTRAVENOUS | Status: AC
  Administered 2017-08-04: 1000 mL via INTRAVENOUS

## 2017-08-04 MED ORDER — DEXTROSE 5 % IV SOLN
1.0000 g | INTRAVENOUS | Status: DC
  Administered 2017-08-04 – 2017-08-06 (×3): 1 g via INTRAVENOUS
  Filled 2017-08-04 (×3): qty 10

## 2017-08-04 MED ORDER — IPRATROPIUM BROMIDE 0.02 % IN SOLN
0.5000 mg | Freq: Three times a day (TID) | RESPIRATORY_TRACT | Status: DC
  Administered 2017-08-04 – 2017-08-06 (×5): 0.5 mg via RESPIRATORY_TRACT
  Filled 2017-08-04 (×7): qty 2.5

## 2017-08-04 MED ORDER — GUAIFENESIN ER 600 MG PO TB12
1200.0000 mg | ORAL_TABLET | Freq: Two times a day (BID) | ORAL | Status: DC
  Administered 2017-08-04 – 2017-08-11 (×15): 1200 mg via ORAL
  Filled 2017-08-04 (×15): qty 2

## 2017-08-04 MED ORDER — LEVALBUTEROL HCL 0.63 MG/3ML IN NEBU
0.6300 mg | INHALATION_SOLUTION | Freq: Three times a day (TID) | RESPIRATORY_TRACT | Status: DC
Start: 2017-08-04 — End: 2017-08-07
  Administered 2017-08-04 – 2017-08-06 (×5): 0.63 mg via RESPIRATORY_TRACT
  Filled 2017-08-04 (×7): qty 3

## 2017-08-04 MED ORDER — BENZONATATE 100 MG PO CAPS
100.0000 mg | ORAL_CAPSULE | Freq: Three times a day (TID) | ORAL | Status: DC | PRN
  Administered 2017-08-04: 100 mg via ORAL
  Filled 2017-08-04: qty 1

## 2017-08-04 MED ORDER — OSELTAMIVIR PHOSPHATE 75 MG PO CAPS
75.0000 mg | ORAL_CAPSULE | Freq: Two times a day (BID) | ORAL | Status: AC
  Administered 2017-08-04 – 2017-08-08 (×10): 75 mg via ORAL
  Filled 2017-08-04 (×10): qty 1

## 2017-08-04 MED ORDER — LORATADINE 10 MG PO TABS
10.0000 mg | ORAL_TABLET | Freq: Every day | ORAL | Status: DC
  Administered 2017-08-04 – 2017-08-11 (×8): 10 mg via ORAL
  Filled 2017-08-04 (×8): qty 1

## 2017-08-04 MED ORDER — FLUTICASONE PROPIONATE 50 MCG/ACT NA SUSP
2.0000 | Freq: Every day | NASAL | Status: DC
  Administered 2017-08-04 – 2017-08-11 (×8): 2 via NASAL
  Filled 2017-08-04: qty 16

## 2017-08-04 MED ORDER — BUDESONIDE 0.5 MG/2ML IN SUSP
0.5000 mg | Freq: Two times a day (BID) | RESPIRATORY_TRACT | Status: DC
  Administered 2017-08-04 – 2017-08-11 (×14): 0.5 mg via RESPIRATORY_TRACT
  Filled 2017-08-04 (×15): qty 2

## 2017-08-04 MED ORDER — IPRATROPIUM BROMIDE 0.02 % IN SOLN
0.5000 mg | Freq: Four times a day (QID) | RESPIRATORY_TRACT | Status: DC
  Administered 2017-08-04: 0.5 mg via RESPIRATORY_TRACT
  Filled 2017-08-04: qty 2.5

## 2017-08-04 MED ORDER — ENOXAPARIN SODIUM 40 MG/0.4ML ~~LOC~~ SOLN
40.0000 mg | SUBCUTANEOUS | Status: DC
  Administered 2017-08-04 – 2017-08-11 (×8): 40 mg via SUBCUTANEOUS
  Filled 2017-08-04 (×8): qty 0.4

## 2017-08-04 MED ORDER — LOPERAMIDE HCL 2 MG PO CAPS
4.0000 mg | ORAL_CAPSULE | Freq: Once | ORAL | Status: AC
Start: 2017-08-04 — End: 2017-08-04
  Administered 2017-08-04: 4 mg via ORAL
  Filled 2017-08-04: qty 2

## 2017-08-04 MED ORDER — IPRATROPIUM BROMIDE 0.02 % IN SOLN: 0.5000 mg | RESPIRATORY_TRACT | Status: DC | PRN

## 2017-08-04 NOTE — ED Notes (Signed)
ED TO INPATIENT HANDOFF REPORT  Name/Age/Gender Tyler [name] 57 y.o. male  Code Status Code Status History    This patient does not have a recorded code status. Please follow your organizational policy for patients in this situation.      Home/SNF/Other Home  Chief Complaint flu like symptoms,off balance, weakness,shouler, legs and stomach pains, fever, sweats  Level of Care/Admitting Diagnosis ED Disposition    ED Disposition Condition Comment   Admit  Hospital Area: [facility] [[id number]]  Level of Care: Med-Surg [16]  Diagnosis: CAP (community acquired pneumonia) [604540]  Admitting Physician: Jani Gravel [[id number]]  Attending Physician: Jani Gravel [[id number]]  PT Class (Do Not Modify): Observation [104]  PT Acc Code (Do Not Modify): Observation [10022]       Medical History Past Medical History:  Diagnosis Date  . Chronic back pain     Allergies No Known Allergies  IV Location/Drains/Wounds Patient Lines/Drains/Airways Status   Active Line/Drains/Airways    Name:   Placement date:   Placement time:   Site:   Days:   Peripheral IV 08/04/17 Right Antecubital   08/04/17    0110    Antecubital   less than 1          Labs/Imaging Results for orders placed or performed during the hospital encounter of 08/03/17 (from the past 48 hour(s))  Lipase, blood     Status: Abnormal   Collection Time: 08/03/17  6:15 PM  Result Value Ref Range   Lipase 62 (H) 11 - 51 U/L  Comprehensive metabolic panel     Status: Abnormal   Collection Time: 08/03/17  6:15 PM  Result Value Ref Range   Sodium 128 (L) 135 - 145 mmol/L   Potassium 3.5 3.5 - 5.1 mmol/L   Chloride 91 (L) 101 - 111 mmol/L   CO2 23 22 - 32 mmol/L   Glucose, Bld 126 (H) 65 - 99 mg/dL   BUN 24 (H) 6 - 20 mg/dL   Creatinine, Ser 1.21 0.61 - 1.24 mg/dL   Calcium 9.4 8.9 - 10.3 mg/dL   Total Protein 8.8 (H) 6.5 - 8.1 g/dL   Albumin 4.0 3.5 - 5.0 g/dL   AST 183 (H) 15 - 41 U/L   ALT 66 (H) 17 -  63 U/L   Alkaline Phosphatase 69 38 - 126 U/L   Total Bilirubin 0.7 0.3 - 1.2 mg/dL   GFR calc non Af Amer >60 >60 mL/min   GFR calc Af Amer >60 >60 mL/min    Comment: (NOTE) The eGFR has been calculated using the CKD EPI equation. This calculation has not been validated in all clinical situations. eGFR's persistently <60 mL/min signify possible Chronic Kidney Disease.    Anion gap 14 5 - 15  CBC     Status: Abnormal   Collection Time: 08/03/17  6:15 PM  Result Value Ref Range   WBC 13.0 (H) 4.0 - 10.5 K/uL   RBC 5.68 4.22 - 5.81 MIL/uL   Hemoglobin 14.1 13.0 - 17.0 g/dL   HCT 39.7 39.0 - 52.0 %   MCV 69.9 (L) 78.0 - 100.0 fL   MCH 24.8 (L) 26.0 - 34.0 pg   MCHC 35.5 30.0 - 36.0 g/dL   RDW 14.2 11.5 - 15.5 %   Platelets 174 150 - 400 K/uL  Influenza panel by PCR (type A & B)     Status: Abnormal   Collection Time: 08/03/17 10:31 PM  Result Value Ref Range  Influenza A By PCR POSITIVE (A) NEGATIVE   Influenza B By PCR NEGATIVE NEGATIVE    Comment: (NOTE) The Xpert Xpress Flu assay is intended as an aid in the diagnosis of  influenza and should not be used as a sole basis for treatment.  This  assay is FDA approved for nasopharyngeal swab specimens only. Nasal  washings and aspirates are unacceptable for Xpert Xpress Flu testing.   Lactic acid, plasma     Status: Abnormal   Collection Time: 08/03/17 10:32 PM  Result Value Ref Range   Lactic Acid, Venous 2.4 (HH) 0.5 - 1.9 mmol/L    Comment: CRITICAL RESULT CALLED TO, READ BACK BY AND VERIFIED WITH: Annabell Howells [id number] @ 7253 BY J SCOTTON    Dg Chest 2 View  Result Date: 08/03/2017 CLINICAL DATA:  Flu like symptoms EXAM: CHEST  2 VIEW COMPARISON:  None. FINDINGS: Streaky left lower lobe infiltrate. No pleural effusion. Borderline cardiomegaly. No pneumothorax. IMPRESSION: Streaky left lower lobe infiltrate Electronically Signed   By: Donavan Foil M.D.   On: 08/03/2017 22:59    Pending Labs Unresulted Labs (From  admission, onward)   Start     Ordered   08/04/17 0110  Troponin I (q 6hr x 3)  Now then every 6 hours,   R     08/04/17 0110   08/03/17 2233  Lactic acid, plasma  Now then every 2 hours,   STAT     08/03/17 2232   08/03/17 1755  Urinalysis, Routine w reflex microscopic  STAT,   STAT     08/03/17 1755   Signed and Held  HIV antibody (Routine Testing)  Once,   R     Signed and Held   Signed and Held  HIV antibody  Once,   R     Signed and Held   Signed and Held  Culture, blood (routine x 2) Call MD if unable to obtain prior to antibiotics being given  BLOOD CULTURE X 2,   R    Comments:  If blood cultures drawn in Emergency Department - Do not draw and cancel order   Question:  Patient immune status  Answer:  Normal   Signed and Held   Signed and Held  Culture, sputum-assessment  Once,   R    Question:  Patient immune status  Answer:  Normal   Signed and Held   Signed and Held  Gram stain  Once,   R    Question:  Patient immune status  Answer:  Normal   Signed and Held   Signed and Held  Strep pneumoniae urinary antigen  Once,   R     Signed and Held   Signed and Held  Creatinine, serum  (enoxaparin (LOVENOX)    CrCl >/= 30 ml/min)  Weekly,   R    Comments:  while on enoxaparin therapy    Signed and Held   Signed and Held  Legionella Pneumophila Serogp 1 Ur Ag  Once,   R     Signed and Held      Vitals/Pain Today's Vitals   08/03/17 1752 08/03/17 2147 08/03/17 2330 08/04/17 0028  BP:  (!) 122/95 (!) 133/97 123/75  Pulse:  79 76 68  Resp:  20 20 12   Temp:  98.5 F (36.9 C)    TempSrc:  Oral    SpO2:  98% 100% 99%  Weight:      Height:      PainSc: 9  Isolation Precautions Droplet precaution  Medications Medications  azithromycin (ZITHROMAX) 500 mg in dextrose 5 % 250 mL IVPB (500 mg Intravenous New Bag/Given 08/04/17 0047)  oseltamivir (TAMIFLU) capsule 75 mg (not administered)  sodium chloride 0.9 % bolus 1,000 mL (0 mLs Intravenous Stopped 08/03/17 2350)   sodium chloride 0.9 % bolus 1,000 mL (1,000 mLs Intravenous New Bag/Given 08/03/17 2350)  cefTRIAXone (ROCEPHIN) 2 g in dextrose 5 % 50 mL IVPB (0 g Intravenous Stopped 08/04/17 0039)  loperamide (IMODIUM) capsule 4 mg (4 mg Oral Given 08/04/17 0115)    Mobility walks with person assist

## 2017-08-04 NOTE — Progress Notes (Signed)
I have seen and assessed patient and I agree with Dr. Julianne Rice assessment and plan.  Patient is a 57 year old gentleman with history of chronic back pain presented with cough productive of yellow sputum, fever and dyspnea with loose stools.  Chest x-ray concerning for left lower lobe pneumonia.  Influenza PCR was positive for influenza A.  Patient currently on Tamiflu and empiric IV antibiotics for community-acquired pneumonia.  C. difficile PCR and GI pathogen panel pending for loose stools.  Placed on scheduled nebulizers, Pulmicort, Flonase, Claritin, PPI.  No charge.

## 2017-08-04 NOTE — H&P (Signed)
[facility] H&P   Patient Demographics:    Karver Fadden, is a 57 y.o. male  MRN: [id number]   DOB - [birth year]  Admit Date - 08/03/2017  Outpatient Primary MD for the patient is Patient, No Pcp Per  Referring MD/NP/PA:   Anderson Malta  Outpatient Specialists:   Patient coming from: home  Chief Complaint  Patient presents with  . Influenza      HPI:    Briston Lax  is a 57 y.o. male, w chronic back pain apparently presents with c/o cough w yellow sputum since Monday.  Pt also notes fever and dyspnea.  Slight nausea, loose stool.   Pt denies chills, cp, palp, abd pain, brbpr, black stool. Pt presented to ED for evaluation.   In Ed,  CXR IMPRESSION: Streaky left lower lobe infiltrate  LA 2.4 Lipase 62 Na 128, K 3.5, Bun 24, Creatinine 1.21 Ast 183, Alt 66  Wbc 13.0, Hgb 14.1, Plt 174  Influenza +,   Pt will be admitted for pneumonia, influenza , abnormal liver function      Review of systems:    In addition to the HPI above,  No Fever-chills, No Headache, No changes with Vision or hearing, No problems swallowing food or Liquids, No Chest pain,  No Abdominal pain, No Nausea or Vommitting, Bowel movements are regular, No Blood in stool or Urine, No dysuria, No new skin rashes or bruises, No new joints pains-aches,  No new weakness, tingling, numbness in any extremity, No recent weight gain or loss, No polyuria, polydypsia or polyphagia, No significant Mental Stressors.  A full 10 point Review of Systems was done, except as stated above, all other Review of Systems were negative.   With Past History of the following :    Past Medical History:  Diagnosis Date  . Chronic back pain       History reviewed. No pertinent surgical history.    Social History:     Social History   Tobacco Use  . Smoking status: Current Every Day Smoker    Packs/day:  0.50  . Smokeless tobacco: Never Used  Substance Use Topics  . Alcohol use: Yes    Comment: Drinks heavy daily.      Lives - at home  Mobility - walks by self   Family History :    History reviewed. No pertinent family history. Father died of being beaten   Home Medications:   Prior to Admission medications   Medication Sig Start Date End Date Taking? Authorizing Provider  Phenyleph-Doxylamine-DM-APAP (NYQUIL SEVERE COLD/FLU) 5-6.25-10-325 MG CAPS Take 2 capsules by mouth daily as needed (COLD).   Yes [provider]  amoxicillin (AMOXIL) 875 MG tablet Take 1 tablet (875 mg total) by mouth 2 (two) times daily. Patient not taking: Reported on 08/03/2017 07/10/15   Carlisle Cater, PA-C  cyclobenzaprine (FLEXERIL) 5 MG tablet Take 2 tablets (  10 mg total) by mouth 2 (two) times daily as needed for muscle spasms. Patient not taking: Reported on 08/03/2017 06/12/14   Delos Haring, PA-C  ibuprofen (ADVIL,MOTRIN) 800 MG tablet Take 1 tablet (800 mg total) by mouth 3 (three) times daily. Patient not taking: Reported on 08/03/2017 06/12/14   Delos Haring, PA-C  oxyCODONE-acetaminophen (PERCOCET/ROXICET) 5-325 MG per tablet Take 1 tablet by mouth every 6 (six) hours as needed for severe pain. Patient not taking: Reported on 08/03/2017 06/12/14   Delos Haring, PA-C     Allergies:    No Known Allergies   Physical Exam:   Vitals  Blood pressure 123/75, pulse 68, temperature 98.5 F (36.9 C), temperature source Oral, resp. rate 12, height 6\' 2"  (1.88 m), weight 77.6 kg (171 lb), SpO2 99 %.   1. General lying in bed in NAD,  2. Normal affect and insight, Not Suicidal or Homicidal, Awake Alert, Oriented X 3.  3. No F.N deficits, ALL C.Nerves Intact, Strength 5/5 all 4 extremities, Sensation intact all 4 extremities, Plantars down going.  4. Ears and Eyes appear Normal, Conjunctivae clear, PERRLA. Moist Oral Mucosa.  5. Supple Neck, No JVD, No cervical lymphadenopathy  appriciated, No Carotid Bruits.  6. Symmetrical Chest wall movement, Good air movement bilaterally, bibasilar crackles, no wheezing  7. RRR, No Gallops, Rubs or Murmurs, No Parasternal Heave.  8. Positive Bowel Sounds, Abdomen Soft, No tenderness, No organomegaly appriciated,No rebound -guarding or rigidity.  9.  No Cyanosis, Normal Skin Turgor, No Skin Rash or Bruise.  10. Good muscle tone,  joints appear normal , no effusions, Normal ROM.  11. No Palpable Lymph Nodes in Neck or Axillae     Data Review:    CBC Recent Labs  Lab 08/03/17 1815  WBC 13.0*  HGB 14.1  HCT 39.7  PLT 174  MCV 69.9*  MCH 24.8*  MCHC 35.5  RDW 14.2   ------------------------------------------------------------------------------------------------------------------  Chemistries  Recent Labs  Lab 08/03/17 1815  NA 128*  K 3.5  CL 91*  CO2 23  GLUCOSE 126*  BUN 24*  CREATININE 1.21  CALCIUM 9.4  AST 183*  ALT 66*  ALKPHOS 69  BILITOT 0.7   ------------------------------------------------------------------------------------------------------------------ estimated creatinine clearance is 73 mL/min (by C-G formula based on SCr of 1.21 mg/dL). ------------------------------------------------------------------------------------------------------------------ No results for input(s): TSH, T4TOTAL, T3FREE, THYROIDAB in the last 72 hours.  Invalid input(s): FREET3  Coagulation profile No results for input(s): INR, PROTIME in the last 168 hours. ------------------------------------------------------------------------------------------------------------------- No results for input(s): DDIMER in the last 72 hours. -------------------------------------------------------------------------------------------------------------------  Cardiac Enzymes No results for input(s): CKMB, TROPONINI, MYOGLOBIN in the last 168 hours.  Invalid input(s):  CK ------------------------------------------------------------------------------------------------------------------ No results found for: BNP   ---------------------------------------------------------------------------------------------------------------  Urinalysis No results found for: COLORURINE, APPEARANCEUR, LABSPEC, PHURINE, GLUCOSEU, HGBUR, BILIRUBINUR, KETONESUR, PROTEINUR, UROBILINOGEN, NITRITE, LEUKOCYTESUR  ----------------------------------------------------------------------------------------------------------------   Imaging Results:    Dg Chest 2 View  Result Date: 08/03/2017 CLINICAL DATA:  Flu like symptoms EXAM: CHEST  2 VIEW COMPARISON:  None. FINDINGS: Streaky left lower lobe infiltrate. No pleural effusion. Borderline cardiomegaly. No pneumothorax. IMPRESSION: Streaky left lower lobe infiltrate Electronically Signed   By: Donavan Foil M.D.   On: 08/03/2017 22:59      Assessment & Plan:    Active Problems:   CAP (community acquired pneumonia)   Hyponatremia   Tachycardia    CAP Blood culture x2 Sputum gram stain, culture Urine strep, urine legionella Rocephin iv, zithromax iv pharmacy to dose  Influenza  Tamiflu 75mg   po bid  Tachycardia Tele Trop I q6h x3 Consider tsh, echo if persistent and not resolving with hydration  Hyponatremia Hydrate with ns iv Check cmp in am If not resolving then consider further w/up    DVT Prophylaxis Lovenox - SCDs  AM Labs Ordered, also please review Full Orders  Family Communication: Admission, patients condition and plan of care including tests being ordered have been discussed with the patient  who indicate understanding and agree with the plan and Code Status.  Code Status FULL CODE  Likely DC to  home  Condition GUARDED   Consults called: none  Admission status: obserrvation   Time spent in minutes : 45   Jani Gravel M.D on 08/04/2017 at 1:11 AM  Between 7am to 7pm - Pager - [phone]  After 7pm go to [email or web address] - password [email or web address]  [facility] - Office  [phone]

## 2017-08-04 NOTE — Progress Notes (Signed)
Critical lab value: troponin = 0.03 Notified MD Grandville Silos

## 2017-08-05 DIAGNOSIS — E860 Dehydration: Secondary | ICD-10-CM | POA: Diagnosis present

## 2017-08-05 DIAGNOSIS — R197 Diarrhea, unspecified: Secondary | ICD-10-CM | POA: Diagnosis present

## 2017-08-05 LAB — EXPECTORATED SPUTUM ASSESSMENT W GRAM STAIN, RFLX TO RESP C

## 2017-08-05 LAB — GASTROINTESTINAL PANEL BY PCR, STOOL (REPLACES STOOL CULTURE)
Adenovirus F40/41: NOT DETECTED
Astrovirus: NOT DETECTED
CRYPTOSPORIDIUM: NOT DETECTED
Campylobacter species: NOT DETECTED
Cyclospora cayetanensis: NOT DETECTED
ENTEROAGGREGATIVE E COLI (EAEC): NOT DETECTED
ENTEROPATHOGENIC E COLI (EPEC): NOT DETECTED
Entamoeba histolytica: NOT DETECTED
Enterotoxigenic E coli (ETEC): NOT DETECTED
GIARDIA LAMBLIA: NOT DETECTED
Norovirus GI/GII: NOT DETECTED
Plesimonas shigelloides: NOT DETECTED
ROTAVIRUS A: NOT DETECTED
SALMONELLA SPECIES: NOT DETECTED
SHIGELLA/ENTEROINVASIVE E COLI (EIEC): NOT DETECTED
Sapovirus (I, II, IV, and V): NOT DETECTED
Shiga like toxin producing E coli (STEC): NOT DETECTED
VIBRIO CHOLERAE: NOT DETECTED
Vibrio species: NOT DETECTED
YERSINIA ENTEROCOLITICA: NOT DETECTED

## 2017-08-05 LAB — MAGNESIUM: MAGNESIUM: 1.5 mg/dL — AB (ref 1.7–2.4)

## 2017-08-05 LAB — BASIC METABOLIC PANEL
Anion gap: 10 (ref 5–15)
BUN: 8 mg/dL (ref 6–20)
CO2: 23 mmol/L (ref 22–32)
CREATININE: 0.62 mg/dL (ref 0.61–1.24)
Calcium: 7.6 mg/dL — ABNORMAL LOW (ref 8.9–10.3)
Chloride: 95 mmol/L — ABNORMAL LOW (ref 101–111)
GFR calc Af Amer: >60 mL/min (ref >60)
GLUCOSE: 107 mg/dL — AB (ref 65–99)
POTASSIUM: 3 mmol/L — AB (ref 3.5–5.1)
SODIUM: 128 mmol/L — AB (ref 135–145)

## 2017-08-05 LAB — OSMOLALITY, URINE: Osmolality, Ur: 650 mOsm/kg (ref 300–900)

## 2017-08-05 LAB — CBC
HCT: 34 % — ABNORMAL LOW (ref 39.0–52.0)
Hemoglobin: 11.9 g/dL — ABNORMAL LOW (ref 13.0–17.0)
MCH: 24 pg — AB (ref 26.0–34.0)
MCHC: 35 g/dL (ref 30.0–36.0)
MCV: 68.5 fL — AB (ref 78.0–100.0)
PLATELETS: 124 10*3/uL — AB (ref 150–400)
RBC: 4.96 MIL/uL (ref 4.22–5.81)
RDW: 14.4 % (ref 11.5–15.5)
WBC: 6.3 10*3/uL (ref 4.0–10.5)

## 2017-08-05 LAB — EXPECTORATED SPUTUM ASSESSMENT W REFEX TO RESP CULTURE: SPECIAL REQUESTS: NORMAL

## 2017-08-05 LAB — OSMOLALITY: Osmolality: 270 mOsm/kg — ABNORMAL LOW (ref 275–295)

## 2017-08-05 MED ORDER — POTASSIUM CHLORIDE CRYS ER 20 MEQ PO TBCR
40.0000 meq | EXTENDED_RELEASE_TABLET | ORAL | Status: AC
  Administered 2017-08-05 (×2): 40 meq via ORAL
  Filled 2017-08-05 (×2): qty 2

## 2017-08-05 MED ORDER — MAGNESIUM SULFATE 4 GM/100ML IV SOLN
4.0000 g | Freq: Once | INTRAVENOUS | Status: AC
  Administered 2017-08-05: 4 g via INTRAVENOUS
  Filled 2017-08-05: qty 100

## 2017-08-05 MED ORDER — LOPERAMIDE HCL 2 MG PO CAPS
4.0000 mg | ORAL_CAPSULE | Freq: Once | ORAL | Status: AC
  Administered 2017-08-05: 4 mg via ORAL
  Filled 2017-08-05: qty 2

## 2017-08-05 MED ORDER — LOPERAMIDE HCL 2 MG PO CAPS
2.0000 mg | ORAL_CAPSULE | ORAL | Status: DC | PRN
  Administered 2017-08-06: 2 mg via ORAL
  Filled 2017-08-05: qty 1

## 2017-08-05 NOTE — Progress Notes (Signed)
PROGRESS NOTE    Tyler [name]  UMP:[id number] DOB: [birth year] DOA: 08/03/2017 PCP: Patient, No Pcp Per   Brief Narrative:  Patient is a 57 year old with chronic back pain presented with cough productive sputum, fever, shortness of breath and noted to have influenza A and community-acquired pneumonia as well as loose stools.  Patient empirically on IV antibiotics and Tamiflu.   Assessment & Plan:   Principal Problem:   CAP (community acquired pneumonia) Active Problems:   Hyponatremia   Tachycardia   Influenza A   Dehydration   Diarrhea  #1 community-acquired pneumonia/influenza A Patient had presented with cough with productive sputum, fever, shortness of breath, loose stools.  Patient noted to be positive for influenza A chest x-ray consistent with a community-acquired pneumonia.  Urine strep pneumococcus antigen negative.  Urine Legionella antigen pending.  Blood cultures pending.  Sputum cultures pending.  Patient still with some wheezing and slowly improving.  Continue Pulmicort, Flonase, Mucinex, scheduled nebulizers, Claritin, Protonix, Tamiflu, IV Rocephin and IV azithromycin.  2.  Diarrhea Likely a viral gastroenteritis.  Consistency of stool improvement.  C. difficile PCR negative.  GI pathogen panel negative.  We will give Imodium 4 mg p.o. x1 and then as needed.  3.  Hyponatremia Likely secondary to dehydration.  Check a urine and serum osmolality.  Check a fractional excretion of sodium.  Continue IV fluids.  Follow.  4.  Tachycardia Likely secondary to acute illness and dehydration.  Improved.  Continue treatment as in #1.  Follow.    DVT prophylaxis: Lovenox Code Status: Full Family Communication: Updated patient.  No family present. Disposition Plan: Home when clinically improved.   Consultants:   None  Procedures:   Chest x-ray 08/03/2017  Antimicrobials:   IV Rocephin 08/04/2017  IV azithromycin 08/04/2017   Subjective: She is still with  loose stools.  Patient states shortness of breath slowly improving however not at baseline.  Patient complained of myalgias.  Cough with some improvement.  Objective: Vitals:   08/05/17 0916 08/05/17 0924 08/05/17 1400 08/05/17 1518  BP:   (!) 141/84   Pulse:   72   Resp:   18   Temp:   100 F (37.8 C)   TempSrc:   Oral   SpO2: 95% 95% 95% 95%  Weight:      Height:        Intake/Output Summary (Last 24 hours) at 08/05/2017 1700 Last data filed at 08/05/2017 1500 Gross per 24 hour  Intake 1650 ml  Output 450 ml  Net 1200 ml   Filed Weights   08/03/17 1734  Weight: 77.6 kg (171 lb)    Examination:  General exam: Appears calm and comfortable  Respiratory system: Patient with some scattered coarse breath sounds.  Some expiratory wheezing.  No crackles.  Respiratory effort normal. Cardiovascular system: S1 & S2 heard, RRR. No JVD, murmurs, rubs, gallops or clicks. No pedal edema. Gastrointestinal system: Abdomen is nondistended, soft and nontender. No organomegaly or masses felt. Normal bowel sounds heard. Central nervous system: Alert and oriented. No focal neurological deficits. Extremities: Symmetric 5 x 5 power. Skin: No rashes, lesions or ulcers Psychiatry: Judgement and insight appear normal. Mood & affect appropriate.     Data Reviewed: I have personally reviewed following labs and imaging studies  CBC: Recent Labs  Lab 08/03/17 1815 08/04/17 0832 08/05/17 0547  WBC 13.0* 8.9 6.3  NEUTROABS  --  6.8  --   HGB 14.1 12.1* 11.9*  HCT 39.7 34.7* 34.0*  MCV 69.9* 69.4* 68.5*  PLT 174 137* 017*   Basic Metabolic Panel: Recent Labs  Lab 08/03/17 1815 08/04/17 0832 08/05/17 0547  NA 128* 131* 128*  K 3.5 3.4* 3.0*  CL 91* 97* 95*  CO2 23 24 23   GLUCOSE 126* 102* 107*  BUN 24* 15 8  CREATININE 1.21 0.72 0.62  CALCIUM 9.4 8.1* 7.6*  MG  --  1.7 1.5*   GFR: Estimated Creatinine Clearance: 110.5 mL/min (by C-G formula based on SCr of 0.62 mg/dL). Liver  Function Tests: Recent Labs  Lab 08/03/17 1815 08/04/17 0832  AST 183* 117*  ALT 66* 46  ALKPHOS 69 49  BILITOT 0.7 0.7  PROT 8.8* 7.0  ALBUMIN 4.0 2.9*   Recent Labs  Lab 08/03/17 1815 08/04/17 0832  LIPASE 62* 72*   No results for input(s): AMMONIA in the last 168 hours. Coagulation Profile: No results for input(s): INR, PROTIME in the last 168 hours. Cardiac Enzymes: Recent Labs  Lab 08/04/17 0151 08/04/17 0832 08/04/17 1311  TROPONINI <0.03 0.03* 0.03*   BNP (last 3 results) No results for input(s): PROBNP in the last 8760 hours. HbA1C: No results for input(s): HGBA1C in the last 72 hours. CBG: No results for input(s): GLUCAP in the last 168 hours. Lipid Profile: No results for input(s): CHOL, HDL, LDLCALC, TRIG, CHOLHDL, LDLDIRECT in the last 72 hours. Thyroid Function Tests: No results for input(s): TSH, T4TOTAL, FREET4, T3FREE, THYROIDAB in the last 72 hours. Anemia Panel: No results for input(s): VITAMINB12, FOLATE, FERRITIN, TIBC, IRON, RETICCTPCT in the last 72 hours. Sepsis Labs: Recent Labs  Lab 08/03/17 2232 08/04/17 0033 08/04/17 0507  LATICACIDVEN 2.4* 3.1* 1.3    Recent Results (from the past 240 hour(s))  Culture, blood (routine x 2) Call MD if unable to obtain prior to antibiotics being given     Status: None (Preliminary result)   Collection Time: 08/04/17  1:51 AM  Result Value Ref Range Status   Specimen Description BLOOD LEFT ANTECUBITAL  Final   Special Requests   Final    BOTTLES DRAWN AEROBIC AND ANAEROBIC Blood Culture adequate volume   Culture   Final    NO GROWTH 1 DAY Performed at [facility], [place] [address], [place], [place] [address]    Report Status PENDING  Incomplete  Culture, blood (routine x 2) Call MD if unable to obtain prior to antibiotics being given     Status: None (Preliminary result)   Collection Time: 08/04/17  2:05 AM  Result Value Ref Range Status   Specimen Description BLOOD LEFT HAND  Final    Special Requests IN PEDIATRIC BOTTLE Blood Culture adequate volume  Final   Culture   Final    NO GROWTH 1 DAY Performed at [facility], [place] [address] [place], [place] [address]    Report Status PENDING  Incomplete  C difficile quick scan w PCR reflex     Status: None   Collection Time: 08/04/17  6:55 PM  Result Value Ref Range Status   C Diff antigen NEGATIVE NEGATIVE Final   C Diff toxin NEGATIVE NEGATIVE Final   C Diff interpretation No C. difficile detected.  Final  Gastrointestinal Panel by PCR , Stool     Status: None   Collection Time: 08/04/17  6:55 PM  Result Value Ref Range Status   Campylobacter species NOT DETECTED NOT DETECTED Final   Plesimonas shigelloides NOT DETECTED NOT DETECTED Final   Salmonella species NOT DETECTED NOT  DETECTED Final   Yersinia enterocolitica NOT DETECTED NOT DETECTED Final   Vibrio species NOT DETECTED NOT DETECTED Final   Vibrio cholerae NOT DETECTED NOT DETECTED Final   Enteroaggregative E coli (EAEC) NOT DETECTED NOT DETECTED Final   Enteropathogenic E coli (EPEC) NOT DETECTED NOT DETECTED Final   Enterotoxigenic E coli (ETEC) NOT DETECTED NOT DETECTED Final   Shiga like toxin producing E coli (STEC) NOT DETECTED NOT DETECTED Final   Shigella/Enteroinvasive E coli (EIEC) NOT DETECTED NOT DETECTED Final   Cryptosporidium NOT DETECTED NOT DETECTED Final   Cyclospora cayetanensis NOT DETECTED NOT DETECTED Final   Entamoeba histolytica NOT DETECTED NOT DETECTED Final   Giardia lamblia NOT DETECTED NOT DETECTED Final   Adenovirus F40/41 NOT DETECTED NOT DETECTED Final   Astrovirus NOT DETECTED NOT DETECTED Final   Norovirus GI/GII NOT DETECTED NOT DETECTED Final   Rotavirus A NOT DETECTED NOT DETECTED Final   Sapovirus (I, II, IV, and V) NOT DETECTED NOT DETECTED Final    Comment: Performed at [facility], [address] [place], [place] [address]  Culture, sputum-assessment     Status: None   Collection Time: 08/04/17  11:45 PM  Result Value Ref Range Status   Specimen Description SPUTUM  Final   Special Requests Normal  Final   Sputum evaluation   Final    Sputum specimen not acceptable for testing.  Please recollect.   NOTIFIED DEBBIE HILL RN 1.25.19 @0211  ZANDO,C    Report Status 08/05/2017 FINAL  Final         Radiology Studies: Dg Chest 2 View  Result Date: 08/03/2017 CLINICAL DATA:  Flu like symptoms EXAM: CHEST  2 VIEW COMPARISON:  None. FINDINGS: Streaky left lower lobe infiltrate. No pleural effusion. Borderline cardiomegaly. No pneumothorax. IMPRESSION: Streaky left lower lobe infiltrate Electronically Signed   By: Donavan Foil M.D.   On: 08/03/2017 22:59        Scheduled Meds: . budesonide (PULMICORT) nebulizer solution  0.5 mg Nebulization BID  . enoxaparin (LOVENOX) injection  40 mg Subcutaneous Q24H  . fluticasone  2 spray Each Nare Daily  . guaiFENesin  1,200 mg Oral BID  . ipratropium  0.5 mg Nebulization TID  . levalbuterol  0.63 mg Nebulization TID  . loratadine  10 mg Oral Daily  . oseltamivir  75 mg Oral BID  . pantoprazole  40 mg Oral Q0600   Continuous Infusions: . sodium chloride 125 mL/hr at 08/05/17 1648  . azithromycin 500 mg (08/05/17 0119)  . cefTRIAXone (ROCEPHIN)  IV Stopped (08/05/17 0020)     LOS: 1 day    Time spent: 35 mins    Irine Seal, MD [facility] Pager [phone]  If 7PM-7AM, please contact night-coverage [email or web address] Password [email or web address] 08/05/2017, 5:00 PM

## 2017-08-05 NOTE — Care Management Note (Signed)
Case Management Note  Patient Details  Name: Tyler [name] MRN: [id number] Date of Birth: 01/15/1961  Subjective/Objective:                  influzena  Action/Plan: Date: August 05, 2017 [name], BSN, [place], [place] Chart and notes review for patient progress and needs. Will follow for case management and discharge needs. No cm or discharge needs present at time of this review. Next review date: [id number]  Expected Discharge Date:                  Expected Discharge Plan:  Home/Self Care  In-House Referral:     Discharge planning Services  CM Consult  Post Acute Care Choice:    Choice offered to:     DME Arranged:    DME Agency:     HH Arranged:    HH Agency:     Status of Service:  In process, will continue to follow  If discussed at Long Length of Stay Meetings, dates discussed:    Additional Comments:  Leeroy Cha, RN 08/05/2017, 8:08 AM

## 2017-08-06 LAB — BASIC METABOLIC PANEL
ANION GAP: 7 (ref 5–15)
ANION GAP: 12 (ref 5–15)
BUN: 6 mg/dL (ref 6–20)
BUN: 7 mg/dL (ref 6–20)
CO2: 23 mmol/L (ref 22–32)
CO2: 23 mmol/L (ref 22–32)
Calcium: 7.3 mg/dL — ABNORMAL LOW (ref 8.9–10.3)
Calcium: 7.9 mg/dL — ABNORMAL LOW (ref 8.9–10.3)
Chloride: 94 mmol/L — ABNORMAL LOW (ref 101–111)
Chloride: 93 mmol/L — ABNORMAL LOW (ref 101–111)
Creatinine, Ser: 0.67 mg/dL (ref 0.61–1.24)
Creatinine, Ser: 0.8 mg/dL (ref 0.61–1.24)
GFR calc Af Amer: >60 mL/min (ref >60)
GFR calc Af Amer: >60 mL/min (ref >60)
GFR calc non Af Amer: >60 mL/min (ref >60)
GLUCOSE: 115 mg/dL — AB (ref 65–99)
GLUCOSE: 131 mg/dL — AB (ref 65–99)
POTASSIUM: 3.5 mmol/L (ref 3.5–5.1)
Potassium: 3.7 mmol/L (ref 3.5–5.1)
Sodium: 124 mmol/L — ABNORMAL LOW (ref 135–145)
Sodium: 128 mmol/L — ABNORMAL LOW (ref 135–145)

## 2017-08-06 LAB — MAGNESIUM: Magnesium: 1.8 mg/dL (ref 1.7–2.4)

## 2017-08-06 LAB — CBC
HEMATOCRIT: 36.5 % — AB (ref 39.0–52.0)
Hemoglobin: 12.8 g/dL — ABNORMAL LOW (ref 13.0–17.0)
MCH: 23.9 pg — ABNORMAL LOW (ref 26.0–34.0)
MCHC: 35.1 g/dL (ref 30.0–36.0)
MCV: 68.1 fL — AB (ref 78.0–100.0)
PLATELETS: 124 10*3/uL — AB (ref 150–400)
RBC: 5.36 MIL/uL (ref 4.22–5.81)
RDW: 14.2 % (ref 11.5–15.5)
WBC: 9.5 10*3/uL (ref 4.0–10.5)

## 2017-08-06 LAB — LEGIONELLA PNEUMOPHILA SEROGP 1 UR AG: L. pneumophila Serogp 1 Ur Ag: NEGATIVE

## 2017-08-06 LAB — SODIUM, URINE, RANDOM: Sodium, Ur: 112 mmol/L

## 2017-08-06 LAB — CREATININE, URINE, RANDOM: Creatinine, Urine: 107.63 mg/dL

## 2017-08-06 MED ORDER — FUROSEMIDE 10 MG/ML IJ SOLN
40.0000 mg | Freq: Once | INTRAMUSCULAR | Status: AC
  Administered 2017-08-06: 40 mg via INTRAVENOUS
  Filled 2017-08-06: qty 4

## 2017-08-06 MED ORDER — ACETAMINOPHEN 325 MG PO TABS
650.0000 mg | ORAL_TABLET | ORAL | Status: DC | PRN
  Administered 2017-08-08 – 2017-08-10 (×2): 650 mg via ORAL
  Filled 2017-08-06 (×3): qty 2

## 2017-08-06 MED ORDER — POTASSIUM CHLORIDE CRYS ER 20 MEQ PO TBCR
40.0000 meq | EXTENDED_RELEASE_TABLET | Freq: Once | ORAL | Status: AC
Start: 2017-08-06 — End: 2017-08-06
  Administered 2017-08-06: 40 meq via ORAL
  Filled 2017-08-06: qty 2

## 2017-08-06 MED ORDER — ONDANSETRON HCL 4 MG/2ML IJ SOLN
4.0000 mg | Freq: Four times a day (QID) | INTRAMUSCULAR | Status: DC | PRN
Start: 2017-08-06 — End: 2017-08-11

## 2017-08-06 NOTE — Progress Notes (Signed)
Pt's SpO2 this morning (~0600) on room air was 82%. Placed him on 2LPM nasal cannula. Satting around 89%. Bumped O2 to 3L. Will continue to monitor and will relay message to dayshift.

## 2017-08-06 NOTE — Progress Notes (Signed)
Pt's temp this AM was 100.1. No PRN tylenol is ordered. Notified on-call MD via text page. No orders received. Will continue to monitor.

## 2017-08-06 NOTE — Progress Notes (Signed)
PROGRESS NOTE    Tyler [name]  KDX:[id number] DOB: 1960-08-08 DOA: 08/03/2017 PCP: Patient, No Pcp Per   Brief Narrative:  Patient is a 57 year old with chronic back pain presented with cough productive sputum, fever, shortness of breath and noted to have influenza A and community-acquired pneumonia as well as loose stools.  Patient empirically on IV antibiotics and Tamiflu.   Assessment & Plan:   Principal Problem:   CAP (community acquired pneumonia) Active Problems:   Hyponatremia   Tachycardia   Influenza A   Dehydration   Diarrhea  #1 community-acquired pneumonia/influenza A Patient had presented with cough with productive sputum, fever, shortness of breath, loose stools.  Patient noted to be positive for influenza A chest x-ray consistent with a community-acquired pneumonia.  Urine strep pneumococcus antigen negative.  Urine Legionella antigen negative. Blood cultures with no growth to date.  Sputum Gram stain and culture negative.  Patient noted to desat with sats of 82% this morning.  Patient with crackles noted in the bases.  We will give Lasix 40 mg IV every 12 hours x2 doses. Continue Pulmicort, Flonase, Mucinex, scheduled nebulizers, Claritin, Protonix, Tamiflu, IV Rocephin and IV azithromycin.  If continued improvement will likely transition to oral antibiotics tomorrow.  2.  Diarrhea Likely a viral gastroenteritis.  Consistency of stool improving daily. C. difficile PCR negative.  GI pathogen panel negative.  Continue Imodium as needed.   3.  Hyponatremia Initially felt likely secondary to dehydration.  Hyponatremia worsening.  Patient with bibasilar crackles and concern for some volume overload secondary to aggressive hydration on admission.  Will saline lock IV fluids.  Will place on Lasix 40 mg IV every 12 hours x2 doses and follow sodium levels.  Repeat BMET this afternoon.    4.  Tachycardia Likely secondary to acute illness and dehydration.  Improved.  Continue  treatment as in #1.  Follow.    DVT prophylaxis: Lovenox Code Status: Full Family Communication: Updated patient.  No family present. Disposition Plan: Home when clinically improved.   Consultants:   None  Procedures:   Chest x-ray 08/03/2017  Antimicrobials:   IV Rocephin 08/04/2017  IV azithromycin 08/04/2017   Subjective: Patient states some worsening shortness of breath this morning.  It was noted that patient had sats of 82% on room air and had to be placed on 2 L nasal cannula with sats going up to 89%.  Patient denies any chest pain.  Patient states cough is improving.  Myalgias slowly improving.   Objective: Vitals:   08/06/17 0604 08/06/17 0854 08/06/17 0907 08/06/17 1146  BP: (!) 155/96     Pulse: (!) 106     Resp: (!) 21     Temp: 100.1 F (37.8 C) 97.8 F (36.6 C)    TempSrc: Oral Oral    SpO2: 90%   93%  Weight:   65.1 kg (143 lb 8.3 oz)   Height:        Intake/Output Summary (Last 24 hours) at 08/06/2017 1153 Last data filed at 08/06/2017 1150 Gross per 24 hour  Intake 2577.08 ml  Output 675 ml  Net 1902.08 ml   Filed Weights   08/03/17 1734 08/06/17 0907  Weight: 77.6 kg (171 lb) 65.1 kg (143 lb 8.3 oz)    Examination:  General exam: Sitting up in chair. Respiratory system: Patient with bibasilar crackles.  Some scattered coarse breath sounds.  Minimal expiratory wheezing.  Minimal use of accessory muscles of respiration.   Respiratory effort normal. Cardiovascular  system: RRR, no murmurs rubs or gallops.  No JVD.  No pedal edema.  Gastrointestinal system: Abdomen is nondistended, soft and nontender. No organomegaly or masses felt. Normal bowel sounds heard. Central nervous system: Alert and oriented. No focal neurological deficits. Extremities: Symmetric 5 x 5 power. Skin: No rashes, lesions or ulcers Psychiatry: Judgement and insight appear normal. Mood & affect appropriate.     Data Reviewed: I have personally reviewed following labs  and imaging studies  CBC: Recent Labs  Lab 08/03/17 1815 08/04/17 0832 08/05/17 0547 08/06/17 0619  WBC 13.0* 8.9 6.3 9.5  NEUTROABS  --  6.8  --   --   HGB 14.1 12.1* 11.9* 12.8*  HCT 39.7 34.7* 34.0* 36.5*  MCV 69.9* 69.4* 68.5* 68.1*  PLT 174 137* 124* 852*   Basic Metabolic Panel: Recent Labs  Lab 08/03/17 1815 08/04/17 0832 08/05/17 0547 08/06/17 0619  NA 128* 131* 128* 124*  K 3.5 3.4* 3.0* 3.5  CL 91* 97* 95* 94*  CO2 23 24 23 23   GLUCOSE 126* 102* 107* 115*  BUN 24* 15 8 6   CREATININE 1.21 0.72 0.62 0.67  CALCIUM 9.4 8.1* 7.6* 7.3*  MG  --  1.7 1.5* 1.8   GFR: Estimated Creatinine Clearance: 92.7 mL/min (by C-G formula based on SCr of 0.67 mg/dL). Liver Function Tests: Recent Labs  Lab 08/03/17 1815 08/04/17 0832  AST 183* 117*  ALT 66* 46  ALKPHOS 69 49  BILITOT 0.7 0.7  PROT 8.8* 7.0  ALBUMIN 4.0 2.9*   Recent Labs  Lab 08/03/17 1815 08/04/17 0832  LIPASE 62* 72*   No results for input(s): AMMONIA in the last 168 hours. Coagulation Profile: No results for input(s): INR, PROTIME in the last 168 hours. Cardiac Enzymes: Recent Labs  Lab 08/04/17 0151 08/04/17 0832 08/04/17 1311  TROPONINI <0.03 0.03* 0.03*   BNP (last 3 results) No results for input(s): PROBNP in the last 8760 hours. HbA1C: No results for input(s): HGBA1C in the last 72 hours. CBG: No results for input(s): GLUCAP in the last 168 hours. Lipid Profile: No results for input(s): CHOL, HDL, LDLCALC, TRIG, CHOLHDL, LDLDIRECT in the last 72 hours. Thyroid Function Tests: No results for input(s): TSH, T4TOTAL, FREET4, T3FREE, THYROIDAB in the last 72 hours. Anemia Panel: No results for input(s): VITAMINB12, FOLATE, FERRITIN, TIBC, IRON, RETICCTPCT in the last 72 hours. Sepsis Labs: Recent Labs  Lab 08/03/17 2232 08/04/17 0033 08/04/17 0507  LATICACIDVEN 2.4* 3.1* 1.3    Recent Results (from the past 240 hour(s))  Culture, blood (routine x 2) Call MD if unable to  obtain prior to antibiotics being given     Status: None (Preliminary result)   Collection Time: 08/04/17  1:51 AM  Result Value Ref Range Status   Specimen Description BLOOD LEFT ANTECUBITAL  Final   Special Requests   Final    BOTTLES DRAWN AEROBIC AND ANAEROBIC Blood Culture adequate volume   Culture   Final    NO GROWTH 1 DAY Performed at [facility], [place] [address], [place],  [address]    Report Status PENDING  Incomplete  Culture, blood (routine x 2) Call MD if unable to obtain prior to antibiotics being given     Status: None (Preliminary result)   Collection Time: 08/04/17  2:05 AM  Result Value Ref Range Status   Specimen Description BLOOD LEFT HAND  Final   Special Requests IN PEDIATRIC BOTTLE Blood Culture adequate volume  Final   Culture  Final    NO GROWTH 1 DAY Performed at [facility], [place] [address] [place], [place] [address]    Report Status PENDING  Incomplete  C difficile quick scan w PCR reflex     Status: None   Collection Time: 08/04/17  6:55 PM  Result Value Ref Range Status   C Diff antigen NEGATIVE NEGATIVE Final   C Diff toxin NEGATIVE NEGATIVE Final   C Diff interpretation No C. difficile detected.  Final  Gastrointestinal Panel by PCR , Stool     Status: None   Collection Time: 08/04/17  6:55 PM  Result Value Ref Range Status   Campylobacter species NOT DETECTED NOT DETECTED Final   Plesimonas shigelloides NOT DETECTED NOT DETECTED Final   Salmonella species NOT DETECTED NOT DETECTED Final   Yersinia enterocolitica NOT DETECTED NOT DETECTED Final   Vibrio species NOT DETECTED NOT DETECTED Final   Vibrio cholerae NOT DETECTED NOT DETECTED Final   Enteroaggregative E coli (EAEC) NOT DETECTED NOT DETECTED Final   Enteropathogenic E coli (EPEC) NOT DETECTED NOT DETECTED Final   Enterotoxigenic E coli (ETEC) NOT DETECTED NOT DETECTED Final   Shiga like toxin producing E coli (STEC) NOT DETECTED NOT DETECTED Final    Shigella/Enteroinvasive E coli (EIEC) NOT DETECTED NOT DETECTED Final   Cryptosporidium NOT DETECTED NOT DETECTED Final   Cyclospora cayetanensis NOT DETECTED NOT DETECTED Final   Entamoeba histolytica NOT DETECTED NOT DETECTED Final   Giardia lamblia NOT DETECTED NOT DETECTED Final   Adenovirus F40/41 NOT DETECTED NOT DETECTED Final   Astrovirus NOT DETECTED NOT DETECTED Final   Norovirus GI/GII NOT DETECTED NOT DETECTED Final   Rotavirus A NOT DETECTED NOT DETECTED Final   Sapovirus (I, II, IV, and V) NOT DETECTED NOT DETECTED Final    Comment: Performed at [facility], [place] [place], [place] [address]  Culture, sputum-assessment     Status: None   Collection Time: 08/04/17 11:45 PM  Result Value Ref Range Status   Specimen Description SPUTUM  Final   Special Requests Normal  Final   Sputum evaluation   Final    Sputum specimen not acceptable for testing.  Please recollect.   NOTIFIED DEBBIE HILL RN 1.25.19 @0211  ZANDO,C    Report Status 08/05/2017 FINAL  Final         Radiology Studies: No results found.      Scheduled Meds: . budesonide (PULMICORT) nebulizer solution  0.5 mg Nebulization BID  . enoxaparin (LOVENOX) injection  40 mg Subcutaneous Q24H  . fluticasone  2 spray Each Nare Daily  . furosemide  40 mg Intravenous Once  . guaiFENesin  1,200 mg Oral BID  . ipratropium  0.5 mg Nebulization TID  . levalbuterol  0.63 mg Nebulization TID  . loratadine  10 mg Oral Daily  . oseltamivir  75 mg Oral BID  . pantoprazole  40 mg Oral Q0600   Continuous Infusions: . azithromycin Stopped (08/06/17 0115)  . cefTRIAXone (ROCEPHIN)  IV Stopped (08/05/17 2156)     LOS: 2 days    Time spent: 35 mins    Irine Seal, MD [facility] Pager [phone]  If 7PM-7AM, please contact night-coverage [email or web address] Password [facility] 08/06/2017, 11:53 AM

## 2017-08-07 ENCOUNTER — Inpatient Hospital Stay (HOSPITAL_COMMUNITY): Payer: Self-pay

## 2017-08-07 DIAGNOSIS — I951 Orthostatic hypotension: Secondary | ICD-10-CM

## 2017-08-07 DIAGNOSIS — R0902 Hypoxemia: Secondary | ICD-10-CM | POA: Diagnosis present

## 2017-08-07 LAB — BASIC METABOLIC PANEL
Anion gap: 11 (ref 5–15)
BUN: 9 mg/dL (ref 6–20)
CHLORIDE: 95 mmol/L — AB (ref 101–111)
CO2: 25 mmol/L (ref 22–32)
CREATININE: 0.69 mg/dL (ref 0.61–1.24)
Calcium: 7.9 mg/dL — ABNORMAL LOW (ref 8.9–10.3)
GFR calc Af Amer: >60 mL/min (ref >60)
GFR calc non Af Amer: >60 mL/min (ref >60)
GLUCOSE: 115 mg/dL — AB (ref 65–99)
POTASSIUM: 3.5 mmol/L (ref 3.5–5.1)
SODIUM: 131 mmol/L — AB (ref 135–145)

## 2017-08-07 LAB — CBC
HEMATOCRIT: 37.7 % — AB (ref 39.0–52.0)
Hemoglobin: 13.6 g/dL (ref 13.0–17.0)
MCH: 24.2 pg — AB (ref 26.0–34.0)
MCHC: 36.1 g/dL — AB (ref 30.0–36.0)
MCV: 67.2 fL — AB (ref 78.0–100.0)
PLATELETS: 124 10*3/uL — AB (ref 150–400)
RBC: 5.61 MIL/uL (ref 4.22–5.81)
RDW: 14 % (ref 11.5–15.5)
WBC: 11.1 10*3/uL — AB (ref 4.0–10.5)

## 2017-08-07 LAB — BRAIN NATRIURETIC PEPTIDE: B Natriuretic Peptide: 496 pg/mL — ABNORMAL HIGH (ref 0.0–100.0)

## 2017-08-07 MED ORDER — SODIUM CHLORIDE 0.9 % IV BOLUS (SEPSIS)
250.0000 mL | Freq: Once | INTRAVENOUS | Status: AC
  Administered 2017-08-07: 250 mL via INTRAVENOUS

## 2017-08-07 MED ORDER — IPRATROPIUM-ALBUTEROL 0.5-2.5 (3) MG/3ML IN SOLN: 3.0000 mL | RESPIRATORY_TRACT | Status: DC | PRN

## 2017-08-07 MED ORDER — LEVALBUTEROL HCL 0.63 MG/3ML IN NEBU: 0.6300 mg | INHALATION_SOLUTION | RESPIRATORY_TRACT | Status: DC | PRN

## 2017-08-07 MED ORDER — IPRATROPIUM-ALBUTEROL 0.5-2.5 (3) MG/3ML IN SOLN
3.0000 mL | Freq: Three times a day (TID) | RESPIRATORY_TRACT | Status: DC
  Administered 2017-08-07 – 2017-08-08 (×4): 3 mL via RESPIRATORY_TRACT
  Filled 2017-08-07 (×4): qty 3

## 2017-08-07 MED ORDER — POTASSIUM CHLORIDE CRYS ER 20 MEQ PO TBCR
40.0000 meq | EXTENDED_RELEASE_TABLET | Freq: Once | ORAL | Status: AC
  Administered 2017-08-07: 40 meq via ORAL
  Filled 2017-08-07: qty 2

## 2017-08-07 MED ORDER — AMOXICILLIN-POT CLAVULANATE 875-125 MG PO TABS
1.0000 | ORAL_TABLET | Freq: Two times a day (BID) | ORAL | Status: DC
  Administered 2017-08-07 – 2017-08-10 (×8): 1 via ORAL
  Filled 2017-08-07 (×8): qty 1

## 2017-08-07 MED ORDER — FUROSEMIDE 10 MG/ML IJ SOLN: 40.0000 mg | Freq: Two times a day (BID) | INTRAMUSCULAR | Status: DC

## 2017-08-07 NOTE — Progress Notes (Signed)
This AM ~0545, pt's SpO2 on 2LPM  was 79-82%. Upped his O2 to 3L and his sats went up to 85%. Upped his O2 to 4LPM and his sats are 89-92%. Notified floor coverage via text page. Will continue to monitor and relay info to dayshift.

## 2017-08-07 NOTE — Progress Notes (Signed)
PROGRESS NOTE    Tyler [name]  JJK:[id number] DOB: 1961-05-10 DOA: 08/03/2017 PCP: Patient, No Pcp Per   Brief Narrative:  Patient is a 57 year old with chronic back pain presented with cough productive sputum, fever, shortness of breath and noted to have influenza A and community-acquired pneumonia as well as loose stools.  Patient empirically on IV antibiotics and Tamiflu.   Assessment & Plan:   Principal Problem:   CAP (community acquired pneumonia) Active Problems:   Hyponatremia   Tachycardia   Influenza A   Dehydration   Diarrhea   Hypoxia  #1 community-acquired pneumonia/influenza A Patient had presented with cough with productive sputum, fever, shortness of breath, loose stools.  Patient noted to be positive for influenza A chest x-ray consistent with a community-acquired pneumonia.  Urine strep pneumococcus antigen negative.  Urine Legionella antigen negative. Blood cultures with no growth to date.  Sputum Gram stain and culture negative.  Patient noted to desat with sats of 82% the morning of 08/06/2017.  Patient with crackles noted in the bases.  Given IV Lasix x2 doses yesterday with urine output of 1.825 L.  Hypoxia improving with sats of 89% on room air this morning and dropping to 84% on ambulation on 2 L.  Patient noted to be orthostatic and as such IV diuretics have been discontinued. Continue Pulmicort, Flonase, Mucinex, scheduled nebulizers, Claritin, Protonix, Tamiflu.  Discontinue IV Rocephin and IV azithromycin and placed on oral Augmentin to complete course of antibiotic treatment.  2.  Diarrhea Likely a viral gastroenteritis.  Consistency of stool improving daily. C. difficile PCR negative.  GI pathogen panel negative.  Continue Imodium as needed.   3.  Hyponatremia Initially felt likely secondary to dehydration.  Hyponatremia worsening.  Patient with bibasilar crackles and concern for some volume overload secondary to aggressive hydration on admission.  IV  fluids were saline locked.  Patient received IV Lasix x2 doses with good urine output of 1.825 L over the past 24 hours.  Hyponatremia improving.  Follow.   4.  Tachycardia Likely secondary to acute illness and dehydration.  Improved.  Continue treatment as in #1.  Follow.  5.  Hypoxia Likely secondary to pneumonia, influenza A, possible volume overload.  Patient noted to be hypoxic this morning on ambulation and some dizziness.  Initial concern was for volume overload.  Patient was placed on IV Lasix yesterday and had a urine output of 1.8 L.  Patient however with some dizziness and orthostasis and as such IV Lasix discontinued.  Check a chest x-ray.  Check a 2D echo.  Follow.  6.  Orthostasis Likely secondary to IV diuretics that patient received yesterday.  Patient with a urine output of 1.825 L over the past 24 hours.  Normal saline 250 cc bolus x1.  Repeat orthostatics in the morning.  Place on TED hose.    DVT prophylaxis: Lovenox Code Status: Full Family Communication: Updated patient.  No family present. Disposition Plan: Home when clinically improved with resolution of hypoxia.   Consultants:   None  Procedures:   Chest x-ray 08/03/2017  Antimicrobials:   IV Rocephin 08/04/2017>>>>> 08/07/2017  IV azithromycin 08/04/2017>>>>>> 08/07/2017  Oral Augmentin 08/07/2017   Subjective: Patient noted per nursing to be hypoxic this morning with complaints of dizziness on ambulation.  Patient noted to have sats of 89% on room air while sitting on the side of the bed and when patient ambulated on 2 L nasal cannula sats dropped to 84%.  Patient states myalgias improving.  Loose stools improved.  Patient feels shortness of breath is improving.   Objective: Vitals:   08/07/17 0500 08/07/17 0535 08/07/17 0718 08/07/17 0924  BP:  110/73  129/87  Pulse:  84  (!) 50  Resp:  18  20  Temp:  97.8 F (36.6 C) (!) 97.5 F (36.4 C) (!) 97 F (36.1 C)  TempSrc:  Oral Oral Oral  SpO2:  91%  92% 90%  Weight: 62.7 kg (138 lb 3.2 oz)     Height:        Intake/Output Summary (Last 24 hours) at 08/07/2017 1343 Last data filed at 08/07/2017 0833 Gross per 24 hour  Intake 240 ml  Output 1850 ml  Net -1610 ml   Filed Weights   08/03/17 1734 08/06/17 0907 08/07/17 0500  Weight: 77.6 kg (171 lb) 65.1 kg (143 lb 8.3 oz) 62.7 kg (138 lb 3.2 oz)    Examination:  General exam: Sitting up in chair. Respiratory system: Patient with bibasilar crackles.  Some scattered coarse breath sounds.   Respiratory effort normal. Cardiovascular system: RRR, no murmurs rubs or gallops.  No JVD.  No pedal edema.  Gastrointestinal system: Abdomen is soft, nontender, nondistended, positive bowel sounds.  No hepatosplenomegaly.  Central nervous system: Alert and oriented. No focal neurological deficits. Extremities: Symmetric 5 x 5 power. Skin: No rashes, lesions or ulcers Psychiatry: Judgement and insight appear normal. Mood & affect appropriate.     Data Reviewed: I have personally reviewed following labs and imaging studies  CBC: Recent Labs  Lab 08/03/17 1815 08/04/17 0832 08/05/17 0547 08/06/17 0619 08/07/17 0558  WBC 13.0* 8.9 6.3 9.5 11.1*  NEUTROABS  --  6.8  --   --   --   HGB 14.1 12.1* 11.9* 12.8* 13.6  HCT 39.7 34.7* 34.0* 36.5* 37.7*  MCV 69.9* 69.4* 68.5* 68.1* 67.2*  PLT 174 137* 124* 124* 427*   Basic Metabolic Panel: Recent Labs  Lab 08/04/17 0832 08/05/17 0547 08/06/17 0619 08/06/17 1448 08/07/17 0558  NA 131* 128* 124* 128* 131*  K 3.4* 3.0* 3.5 3.7 3.5  CL 97* 95* 94* 93* 95*  CO2 24 23 23 23 25   GLUCOSE 102* 107* 115* 131* 115*  BUN 15 8 6 7 9   CREATININE 0.72 0.62 0.67 0.80 0.69  CALCIUM 8.1* 7.6* 7.3* 7.9* 7.9*  MG 1.7 1.5* 1.8  --   --    GFR: Estimated Creatinine Clearance: 89.3 mL/min (by C-G formula based on SCr of 0.69 mg/dL). Liver Function Tests: Recent Labs  Lab 08/03/17 1815 08/04/17 0832  AST 183* 117*  ALT 66* 46  ALKPHOS 69 49    BILITOT 0.7 0.7  PROT 8.8* 7.0  ALBUMIN 4.0 2.9*   Recent Labs  Lab 08/03/17 1815 08/04/17 0832  LIPASE 62* 72*   No results for input(s): AMMONIA in the last 168 hours. Coagulation Profile: No results for input(s): INR, PROTIME in the last 168 hours. Cardiac Enzymes: Recent Labs  Lab 08/04/17 0151 08/04/17 0832 08/04/17 1311  TROPONINI <0.03 0.03* 0.03*   BNP (last 3 results) No results for input(s): PROBNP in the last 8760 hours. HbA1C: No results for input(s): HGBA1C in the last 72 hours. CBG: No results for input(s): GLUCAP in the last 168 hours. Lipid Profile: No results for input(s): CHOL, HDL, LDLCALC, TRIG, CHOLHDL, LDLDIRECT in the last 72 hours. Thyroid Function Tests: No results for input(s): TSH, T4TOTAL, FREET4, T3FREE, THYROIDAB in the last 72 hours. Anemia Panel: No results for input(s): VITAMINB12,  FOLATE, FERRITIN, TIBC, IRON, RETICCTPCT in the last 72 hours. Sepsis Labs: Recent Labs  Lab 08/03/17 2232 08/04/17 0033 08/04/17 0507  LATICACIDVEN 2.4* 3.1* 1.3    Recent Results (from the past 240 hour(s))  Culture, blood (routine x 2) Call MD if unable to obtain prior to antibiotics being given     Status: None (Preliminary result)   Collection Time: 08/04/17  1:51 AM  Result Value Ref Range Status   Specimen Description BLOOD LEFT ANTECUBITAL  Final   Special Requests   Final    BOTTLES DRAWN AEROBIC AND ANAEROBIC Blood Culture adequate volume   Culture   Final    NO GROWTH 3 DAYS Performed at [facility], [place] [address] [place], [place] [address]    Report Status PENDING  Incomplete  Culture, blood (routine x 2) Call MD if unable to obtain prior to antibiotics being given     Status: None (Preliminary result)   Collection Time: 08/04/17  2:05 AM  Result Value Ref Range Status   Specimen Description BLOOD LEFT HAND  Final   Special Requests IN PEDIATRIC BOTTLE Blood Culture adequate volume  Final   Culture   Final    NO GROWTH 3  DAYS Performed at [facility], [place] [address], [place], [place] [address]    Report Status PENDING  Incomplete  C difficile quick scan w PCR reflex     Status: None   Collection Time: 08/04/17  6:55 PM  Result Value Ref Range Status   C Diff antigen NEGATIVE NEGATIVE Final   C Diff toxin NEGATIVE NEGATIVE Final   C Diff interpretation No C. difficile detected.  Final  Gastrointestinal Panel by PCR , Stool     Status: None   Collection Time: 08/04/17  6:55 PM  Result Value Ref Range Status   Campylobacter species NOT DETECTED NOT DETECTED Final   Plesimonas shigelloides NOT DETECTED NOT DETECTED Final   Salmonella species NOT DETECTED NOT DETECTED Final   Yersinia enterocolitica NOT DETECTED NOT DETECTED Final   Vibrio species NOT DETECTED NOT DETECTED Final   Vibrio cholerae NOT DETECTED NOT DETECTED Final   Enteroaggregative E coli (EAEC) NOT DETECTED NOT DETECTED Final   Enteropathogenic E coli (EPEC) NOT DETECTED NOT DETECTED Final   Enterotoxigenic E coli (ETEC) NOT DETECTED NOT DETECTED Final   Shiga like toxin producing E coli (STEC) NOT DETECTED NOT DETECTED Final   Shigella/Enteroinvasive E coli (EIEC) NOT DETECTED NOT DETECTED Final   Cryptosporidium NOT DETECTED NOT DETECTED Final   Cyclospora cayetanensis NOT DETECTED NOT DETECTED Final   Entamoeba histolytica NOT DETECTED NOT DETECTED Final   Giardia lamblia NOT DETECTED NOT DETECTED Final   Adenovirus F40/41 NOT DETECTED NOT DETECTED Final   Astrovirus NOT DETECTED NOT DETECTED Final   Norovirus GI/GII NOT DETECTED NOT DETECTED Final   Rotavirus A NOT DETECTED NOT DETECTED Final   Sapovirus (I, II, IV, and V) NOT DETECTED NOT DETECTED Final    Comment: Performed at [facility], [address] [place], [place] [address]  Culture, sputum-assessment     Status: None   Collection Time: 08/04/17 11:45 PM  Result Value Ref Range Status   Specimen Description SPUTUM  Final   Special Requests Normal   Final   Sputum evaluation   Final    Sputum specimen not acceptable for testing.  Please recollect.   NOTIFIED DEBBIE HILL RN 1.25.19 @0211  ZANDO,C    Report Status 08/05/2017 FINAL  Final         Radiology Studies: No results found.      Scheduled Meds: . amoxicillin-clavulanate  1 tablet Oral Q12H  . budesonide (PULMICORT) nebulizer solution  0.5 mg Nebulization BID  . enoxaparin (LOVENOX) injection  40 mg Subcutaneous Q24H  . fluticasone  2 spray Each Nare Daily  . guaiFENesin  1,200 mg Oral BID  . ipratropium-albuterol  3 mL Nebulization TID  . loratadine  10 mg Oral Daily  . oseltamivir  75 mg Oral BID  . pantoprazole  40 mg Oral Q0600   Continuous Infusions:    LOS: 3 days    Time spent: 35 mins    Irine Seal, MD [facility] Pager [phone]  If 7PM-7AM, please contact night-coverage [email or web address] Password [facility] 08/07/2017, 1:43 PM

## 2017-08-07 NOTE — Progress Notes (Signed)
SATURATION QUALIFICATIONS: (This note is used to comply with regulatory documentation for home oxygen)  Patient Saturations on Room Air at Rest = 89%  Patient Saturations on Room Air while Ambulating =   Patient Saturations on 2 Liters of oxygen while Ambulating = 84%  Please briefly explain why patient needs home oxygen:

## 2017-08-07 NOTE — Progress Notes (Signed)
O2 89% on room air while sitting on side of bed.  Placed patient on 2L oxygen ambulated 12 feet O2 sats dropped to 84%.  Patient complain of dizziness and unsteady gait.  Patient return to bed placed on 4L oxygen O2 sats 93%.

## 2017-08-08 ENCOUNTER — Encounter (HOSPITAL_COMMUNITY): Payer: Self-pay | Admitting: Physician Assistant

## 2017-08-08 ENCOUNTER — Inpatient Hospital Stay (HOSPITAL_COMMUNITY): Payer: Self-pay

## 2017-08-08 ENCOUNTER — Other Ambulatory Visit (HOSPITAL_COMMUNITY): Payer: Self-pay

## 2017-08-08 DIAGNOSIS — I471 Supraventricular tachycardia: Secondary | ICD-10-CM

## 2017-08-08 DIAGNOSIS — J9601 Acute respiratory failure with hypoxia: Secondary | ICD-10-CM | POA: Diagnosis present

## 2017-08-08 LAB — BLOOD GAS, ARTERIAL
Acid-Base Excess: 4.4 mmol/L — ABNORMAL HIGH (ref 0.0–2.0)
BICARBONATE: 26.5 mmol/L (ref 20.0–28.0)
DRAWN BY: 257701
O2 CONTENT: 3.5 L/min
O2 SAT: 91.8 %
PATIENT TEMPERATURE: 100.3
PO2 ART: 66.4 mmHg — AB (ref 83.0–108.0)
pCO2 arterial: 33.5 mmHg (ref 32.0–48.0)
pH, Arterial: 7.514 — ABNORMAL HIGH (ref 7.350–7.450)

## 2017-08-08 LAB — RESPIRATORY PANEL BY PCR
Adenovirus: NOT DETECTED
Bordetella pertussis: NOT DETECTED
CHLAMYDOPHILA PNEUMONIAE-RVPPCR: NOT DETECTED
CORONAVIRUS NL63-RVPPCR: NOT DETECTED
Coronavirus 229E: NOT DETECTED
Coronavirus HKU1: NOT DETECTED
Coronavirus OC43: NOT DETECTED
INFLUENZA A-RVPPCR: NOT DETECTED
INFLUENZA B-RVPPCR: NOT DETECTED
MYCOPLASMA PNEUMONIAE-RVPPCR: NOT DETECTED
Metapneumovirus: NOT DETECTED
PARAINFLUENZA VIRUS 3-RVPPCR: NOT DETECTED
PARAINFLUENZA VIRUS 4-RVPPCR: NOT DETECTED
Parainfluenza Virus 1: NOT DETECTED
Parainfluenza Virus 2: NOT DETECTED
RHINOVIRUS / ENTEROVIRUS - RVPPCR: NOT DETECTED
Respiratory Syncytial Virus: NOT DETECTED

## 2017-08-08 LAB — BASIC METABOLIC PANEL
ANION GAP: 9 (ref 5–15)
BUN: 10 mg/dL (ref 6–20)
CALCIUM: 8.1 mg/dL — AB (ref 8.9–10.3)
CO2: 28 mmol/L (ref 22–32)
Chloride: 95 mmol/L — ABNORMAL LOW (ref 101–111)
Creatinine, Ser: 0.8 mg/dL (ref 0.61–1.24)
GFR calc non Af Amer: >60 mL/min (ref >60)
Glucose, Bld: 114 mg/dL — ABNORMAL HIGH (ref 65–99)
Potassium: 4 mmol/L (ref 3.5–5.1)
Sodium: 132 mmol/L — ABNORMAL LOW (ref 135–145)

## 2017-08-08 LAB — CBC WITH DIFFERENTIAL/PLATELET
BASOS ABS: 0.1 10*3/uL (ref 0.0–0.1)
Basophils Relative: 1 %
Eosinophils Absolute: 0 10*3/uL (ref 0.0–0.7)
Eosinophils Relative: 0 %
HCT: 37.1 % — ABNORMAL LOW (ref 39.0–52.0)
HEMOGLOBIN: 13 g/dL (ref 13.0–17.0)
LYMPHS PCT: 22 %
Lymphs Abs: 2.5 10*3/uL (ref 0.7–4.0)
MCH: 23.9 pg — ABNORMAL LOW (ref 26.0–34.0)
MCHC: 35 g/dL (ref 30.0–36.0)
MCV: 68.3 fL — ABNORMAL LOW (ref 78.0–100.0)
Monocytes Absolute: 0.9 10*3/uL (ref 0.1–1.0)
Monocytes Relative: 8 %
Neutro Abs: 7.7 10*3/uL (ref 1.7–7.7)
Neutrophils Relative %: 69 %
Platelets: 179 10*3/uL (ref 150–400)
RBC: 5.43 MIL/uL (ref 4.22–5.81)
RDW: 14 % (ref 11.5–15.5)
WBC: 11.2 10*3/uL — AB (ref 4.0–10.5)

## 2017-08-08 LAB — PROCALCITONIN: Procalcitonin: 1.63 ng/mL

## 2017-08-08 LAB — TSH: TSH: 1.515 u[IU]/mL (ref 0.350–4.500)

## 2017-08-08 LAB — SEDIMENTATION RATE: Sed Rate: 41 mm/hr — ABNORMAL HIGH (ref 0–16)

## 2017-08-08 MED ORDER — IPRATROPIUM BROMIDE 0.02 % IN SOLN: 0.5000 mg | RESPIRATORY_TRACT | Status: DC | PRN

## 2017-08-08 MED ORDER — IPRATROPIUM BROMIDE 0.02 % IN SOLN
0.5000 mg | RESPIRATORY_TRACT | Status: DC
  Administered 2017-08-08 (×3): 0.5 mg via RESPIRATORY_TRACT
  Filled 2017-08-08 (×4): qty 2.5

## 2017-08-08 MED ORDER — LEVALBUTEROL HCL 0.63 MG/3ML IN NEBU
0.6300 mg | INHALATION_SOLUTION | RESPIRATORY_TRACT | Status: DC
  Administered 2017-08-08 (×3): 0.63 mg via RESPIRATORY_TRACT
  Filled 2017-08-08 (×4): qty 3

## 2017-08-08 MED ORDER — METHYLPREDNISOLONE SODIUM SUCC 125 MG IJ SOLR
60.0000 mg | Freq: Four times a day (QID) | INTRAMUSCULAR | Status: DC
  Administered 2017-08-08 – 2017-08-11 (×12): 60 mg via INTRAVENOUS
  Filled 2017-08-08 (×12): qty 2

## 2017-08-08 MED ORDER — SODIUM CHLORIDE 0.9 % IV BOLUS (SEPSIS)
250.0000 mL | Freq: Once | INTRAVENOUS | Status: AC
  Administered 2017-08-08: 250 mL via INTRAVENOUS

## 2017-08-08 MED ORDER — LEVALBUTEROL HCL 0.63 MG/3ML IN NEBU: 0.6300 mg | INHALATION_SOLUTION | RESPIRATORY_TRACT | Status: DC | PRN

## 2017-08-08 MED ORDER — SODIUM CHLORIDE 0.9 % IV SOLN
INTRAVENOUS | Status: DC
  Administered 2017-08-08 – 2017-08-09 (×3): via INTRAVENOUS

## 2017-08-08 NOTE — Progress Notes (Signed)
PROGRESS NOTE    Tyler [name]  PQZ:[id number] DOB: [birth year] DOA: 08/03/2017 PCP: Patient, No Pcp Per   Brief Narrative:  Patient is a 57 year old with chronic back pain presented with cough productive sputum, fever, shortness of breath and noted to have influenza A and community-acquired pneumonia as well as loose stools.  Patient empirically on IV antibiotics and Tamiflu.   Assessment & Plan:   Principal Problem:   Acute respiratory failure with hypoxia (HCC) Active Problems:   CAP (community acquired pneumonia)   Influenza A   Hyponatremia   Tachycardia   Dehydration   Diarrhea   Hypoxia   #1 acute respiratory distress with hypoxia secondary to community-acquired pneumonia/influenza A Patient had presented with cough with productive sputum, fever, shortness of breath, loose stools.  Patient noted to be positive for influenza A chest x-ray consistent with a community-acquired pneumonia.  Urine strep pneumococcus antigen negative.  Urine Legionella antigen negative. Blood cultures with no growth to date.  Sputum Gram stain and culture negative.  Patient noted to desat with sats of 82% the morning of 08/06/2017.  Patient with crackles noted in the bases.  Given IV Lasix x2 doses with urine output of 1.825 L.  Hypoxia initially improved with sats of 89% on room air and dropping to 84% on ambulation on 2 L.  Patient noted to be orthostatic and as such IV diuretics have been discontinued.  Patient noted to have continued hypoxia and as such chest x-ray obtained 08/07/2017 with worsening bilateral infiltrates.  Patient does not significantly look volume overloaded on examination and likely worsening pulmonary illness.  Patient still hypoxic.  Repeat chest x-ray this morning.  2D echo pending.  Continue Pulmicort, Flonase, Mucinex, scheduled nebulizers, Claritin, Protonix, Tamiflu.  Discontinued IV Rocephin and IV azithromycin and placed on oral Augmentin to complete course of antibiotic  treatment.  Due to worsening hypoxia and respiratory status will consult with pulmonary for further evaluation and management.  Cardiology has also been consulted due to concerns for possible volume overload.  2.  Diarrhea Likely a viral gastroenteritis.  Consistency of stool improving daily. C. difficile PCR negative.  GI pathogen panel negative.  Continue Imodium as needed.   3.  Hyponatremia Initially felt likely secondary to dehydration.  Hyponatremia improved with diuresis.  Patient with bibasilar crackles and concern for some volume overload secondary to aggressive hydration on admission.  IV fluids were saline locked.  Patient received IV Lasix x2 doses with good urine output.  Hyponatremia improving.  Follow.   4.  Tachycardia/MAT Likely secondary to acute illness and dehydration.  Patient still with some tachycardia.  EKG consistent with MAT.  Placed on telemetry per cardiology.  Continue treatment for acute illness.  Follow.  5.  Orthostasis Likely secondary to IV diuretics that patient received.  Patient with a urine output of 800 cc over the past 24 hours.  Patient still orthostatic and dizzy.  Give a normal saline bolus to 50 cc x1.  Placed on gentle hydration normal saline 100 cc/h for the next 24 hours and monitor.     DVT prophylaxis: Lovenox Code Status: Full Family Communication: Updated patient.  No family present. Disposition Plan: Home when clinically improved with resolution of hypoxia.   Consultants:   Cardiology pending  Procedures:   Chest x-ray 08/03/2017  Antimicrobials:   IV Rocephin 08/04/2017>>>>> 08/07/2017  IV azithromycin 08/04/2017>>>>>> 08/07/2017  Oral Augmentin 08/07/2017   Subjective: Patient states no significant improvement with his breathing.  Patient just stood  up for orthostatics and complaining of dizziness.  Patient tachypneic.  Patient denies any chest pain.  Patient is noted to be hypoxic with sats in the low 80s on room  air.  Objective: Vitals:   08/07/17 1954 08/07/17 2112 08/08/17 0518 08/08/17 0901  BP:  (!) 122/51 113/66   Pulse:  77 61   Resp:  17 18   Temp:  99.1 F (37.3 C) 100.3 F (37.9 C)   TempSrc:  Oral Oral   SpO2: 92% 98% 95% 92%  Weight:      Height:        Intake/Output Summary (Last 24 hours) at 08/08/2017 1024 Last data filed at 08/07/2017 1700 Gross per 24 hour  Intake 360 ml  Output 300 ml  Net 60 ml   Filed Weights   08/03/17 1734 08/06/17 0907 08/07/17 0500  Weight: 77.6 kg (171 lb) 65.1 kg (143 lb 8.3 oz) 62.7 kg (138 lb 3.2 oz)    Examination:  General exam: Sitting up in bed. Respiratory system: Patient with diffuse crackles. No wheezing.  Tachypnea.  Cardiovascular system: Tachycardia, no murmurs rubs or gallops.  No JVD.  No pedal edema.  Gastrointestinal system: Abdomen is soft, nontender, nondistended, positive bowel sounds.  No hepatosplenomegaly.  Central nervous system: Alert and oriented. No focal neurological deficits. Extremities: Symmetric 5 x 5 power. Skin: No rashes, lesions or ulcers Psychiatry: Judgement and insight appear normal. Mood & affect appropriate.     Data Reviewed: I have personally reviewed following labs and imaging studies  CBC: Recent Labs  Lab 08/04/17 0832 08/05/17 0547 08/06/17 0619 08/07/17 0558 08/08/17 0547  WBC 8.9 6.3 9.5 11.1* 11.2*  NEUTROABS 6.8  --   --   --  7.7  HGB 12.1* 11.9* 12.8* 13.6 13.0  HCT 34.7* 34.0* 36.5* 37.7* 37.1*  MCV 69.4* 68.5* 68.1* 67.2* 68.3*  PLT 137* 124* 124* 124* 242   Basic Metabolic Panel: Recent Labs  Lab 08/04/17 0832 08/05/17 0547 08/06/17 0619 08/06/17 1448 08/07/17 0558 08/08/17 0547  NA 131* 128* 124* 128* 131* 132*  K 3.4* 3.0* 3.5 3.7 3.5 4.0  CL 97* 95* 94* 93* 95* 95*  CO2 24 23 23 23 25 28   GLUCOSE 102* 107* 115* 131* 115* 114*  BUN 15 8 6 7 9 10   CREATININE 0.72 0.62 0.67 0.80 0.69 0.80  CALCIUM 8.1* 7.6* 7.3* 7.9* 7.9* 8.1*  MG 1.7 1.5* 1.8  --   --    --    GFR: Estimated Creatinine Clearance: 89.3 mL/min (by C-G formula based on SCr of 0.8 mg/dL). Liver Function Tests: Recent Labs  Lab 08/03/17 1815 08/04/17 0832  AST 183* 117*  ALT 66* 46  ALKPHOS 69 49  BILITOT 0.7 0.7  PROT 8.8* 7.0  ALBUMIN 4.0 2.9*   Recent Labs  Lab 08/03/17 1815 08/04/17 0832  LIPASE 62* 72*   No results for input(s): AMMONIA in the last 168 hours. Coagulation Profile: No results for input(s): INR, PROTIME in the last 168 hours. Cardiac Enzymes: Recent Labs  Lab 08/04/17 0151 08/04/17 0832 08/04/17 1311  TROPONINI <0.03 0.03* 0.03*   BNP (last 3 results) No results for input(s): PROBNP in the last 8760 hours. HbA1C: No results for input(s): HGBA1C in the last 72 hours. CBG: No results for input(s): GLUCAP in the last 168 hours. Lipid Profile: No results for input(s): CHOL, HDL, LDLCALC, TRIG, CHOLHDL, LDLDIRECT in the last 72 hours. Thyroid Function Tests: No results for input(s): TSH, T4TOTAL, FREET4,  T3FREE, THYROIDAB in the last 72 hours. Anemia Panel: No results for input(s): VITAMINB12, FOLATE, FERRITIN, TIBC, IRON, RETICCTPCT in the last 72 hours. Sepsis Labs: Recent Labs  Lab 08/03/17 2232 08/04/17 0033 08/04/17 0507  LATICACIDVEN 2.4* 3.1* 1.3    Recent Results (from the past 240 hour(s))  Culture, blood (routine x 2) Call MD if unable to obtain prior to antibiotics being given     Status: None (Preliminary result)   Collection Time: 08/04/17  1:51 AM  Result Value Ref Range Status   Specimen Description BLOOD LEFT ANTECUBITAL  Final   Special Requests   Final    BOTTLES DRAWN AEROBIC AND ANAEROBIC Blood Culture adequate volume   Culture   Final    NO GROWTH 3 DAYS Performed at [facility], [place] [address], [place], [place] [address]    Report Status PENDING  Incomplete  Culture, blood (routine x 2) Call MD if unable to obtain prior to antibiotics being given     Status: None (Preliminary result)   Collection  Time: 08/04/17  2:05 AM  Result Value Ref Range Status   Specimen Description BLOOD LEFT HAND  Final   Special Requests IN PEDIATRIC BOTTLE Blood Culture adequate volume  Final   Culture   Final    NO GROWTH 3 DAYS Performed at [facility], [place] [address] [place], [place] [address]    Report Status PENDING  Incomplete  C difficile quick scan w PCR reflex     Status: None   Collection Time: 08/04/17  6:55 PM  Result Value Ref Range Status   C Diff antigen NEGATIVE NEGATIVE Final   C Diff toxin NEGATIVE NEGATIVE Final   C Diff interpretation No C. difficile detected.  Final  Gastrointestinal Panel by PCR , Stool     Status: None   Collection Time: 08/04/17  6:55 PM  Result Value Ref Range Status   Campylobacter species NOT DETECTED NOT DETECTED Final   Plesimonas shigelloides NOT DETECTED NOT DETECTED Final   Salmonella species NOT DETECTED NOT DETECTED Final   Yersinia enterocolitica NOT DETECTED NOT DETECTED Final   Vibrio species NOT DETECTED NOT DETECTED Final   Vibrio cholerae NOT DETECTED NOT DETECTED Final   Enteroaggregative E coli (EAEC) NOT DETECTED NOT DETECTED Final   Enteropathogenic E coli (EPEC) NOT DETECTED NOT DETECTED Final   Enterotoxigenic E coli (ETEC) NOT DETECTED NOT DETECTED Final   Shiga like toxin producing E coli (STEC) NOT DETECTED NOT DETECTED Final   Shigella/Enteroinvasive E coli (EIEC) NOT DETECTED NOT DETECTED Final   Cryptosporidium NOT DETECTED NOT DETECTED Final   Cyclospora cayetanensis NOT DETECTED NOT DETECTED Final   Entamoeba histolytica NOT DETECTED NOT DETECTED Final   Giardia lamblia NOT DETECTED NOT DETECTED Final   Adenovirus F40/41 NOT DETECTED NOT DETECTED Final   Astrovirus NOT DETECTED NOT DETECTED Final   Norovirus GI/GII NOT DETECTED NOT DETECTED Final   Rotavirus A NOT DETECTED NOT DETECTED Final   Sapovirus (I, II, IV, and V) NOT DETECTED NOT DETECTED Final    Comment: Performed at [facility], [address] [place], [place] [address]  Culture, sputum-assessment     Status: None   Collection Time: 08/04/17 11:45 PM  Result Value Ref Range Status   Specimen Description SPUTUM  Final   Special Requests Normal  Final   Sputum evaluation   Final    Sputum specimen not acceptable for testing.  Please recollect.   NOTIFIED  DEBBIE HILL RN 1.25.19 @0211  ZANDO,C    Report Status 08/05/2017 FINAL  Final         Radiology Studies: Dg Chest 2 View  Result Date: 08/07/2017 CLINICAL DATA:  Hypoxia EXAM: CHEST  2 VIEW COMPARISON:  08/03/2017 FINDINGS: Cardiac shadow is stable. Diffuse interstitial changes are noted bilaterally consistent with edema. No sizable effusion is noted. No bony abnormality is seen. IMPRESSION: Diffuse interstitial infiltrates bilaterally. Electronically Signed   By: Inez Catalina M.D.   On: 08/07/2017 14:45        Scheduled Meds: . amoxicillin-clavulanate  1 tablet Oral Q12H  . budesonide (PULMICORT) nebulizer solution  0.5 mg Nebulization BID  . enoxaparin (LOVENOX) injection  40 mg Subcutaneous Q24H  . fluticasone  2 spray Each Nare Daily  . guaiFENesin  1,200 mg Oral BID  . loratadine  10 mg Oral Daily  . pantoprazole  40 mg Oral Q0600   Continuous Infusions: . sodium chloride       LOS: 4 days    Time spent: 35 mins    Irine Seal, MD [facility] Pager [phone]  If 7PM-7AM, please contact night-coverage [email or web address] Password [email or web address] 08/08/2017, 10:24 AM

## 2017-08-08 NOTE — Consult Note (Signed)
Cardiology Consultation:   Patient ID: Tyler [name]; [id number]; [birth year]   Admit date: 08/03/2017 Date of Consult: 08/08/2017  Primary Care Provider: Patient, No Pcp Per Primary Cardiologist: New to Dr. Acie Fredrickson  Chief Complaint: weakness, malaise  Patient Profile:   Tyler [name] is a 57 y.o. male with a hx of alcohol abuse (at least 6 pack per day), tobacco abuse and chronic back pain who is being seen today for the evaluation of possible CHF at the request of Dr. Grandville Silos.  History of Present Illness:   Tyler [name] has no prior cardiac history, only a family history of CAD in grandfather who had MI later in life. He drinks 6 pack of beer per day and has smoked since age 29. He has been ill since approximately 08/01/17 with cough with yellow sputum, fever, dyspnea, and diffuse myalgias including chest pain, back pain, abdominal pain and leg pain. He also has had slightly loose stool, minimal oral intake, and had a few falls due to weakness prior to admission. He was found to be positive for influenza A with imaging suggesting a streaky left infiltrate. Labs were notable for lactic acidosis, hyponatremia down to 124, and leukocytosis. He also had low flat troponin at 0.03. He was initially fluid rescuscitated but on 1/26 developed bibasilar crackles and was treated with trial of Lasix with -1.8L UOP. BNP was 426.  He was initially on Rocephin and Zithromax which was transitioned to Augmentin yesterday. Notes indicate slow to improve which patient corroborates - feeling more SOB this AM. He also has been noted to be orthostatic on exam and persistently hypoxic. CXR today shows persistent extensive bilateral pulmonary infiltrates, slightly progressed on the right and slightly improved on the left with otherwise normal pulmonary vascularity. Admit wt listed as 171 (?stated), then 143 on 1/26, then 138 on 1/27, not yet weighed today. Not clear that I/O's are complete. Although previously  afebrile, his current temp this morning was 100.3 which is a rise from prior. Denies CP today. 12 lead EKG obtained this AM shows HR 151 with tracing suggestive of MAT.   Past Medical History:  Diagnosis Date  . Alcohol abuse   . Chronic back pain     History reviewed. No pertinent surgical history.   Inpatient Medications: Scheduled Meds: . amoxicillin-clavulanate  1 tablet Oral Q12H  . budesonide (PULMICORT) nebulizer solution  0.5 mg Nebulization BID  . enoxaparin (LOVENOX) injection  40 mg Subcutaneous Q24H  . fluticasone  2 spray Each Nare Daily  . guaiFENesin  1,200 mg Oral BID  . ipratropium  0.5 mg Nebulization Q3H  . levalbuterol  0.63 mg Nebulization Q3H  . loratadine  10 mg Oral Daily  . pantoprazole  40 mg Oral Q0600   Continuous Infusions:  PRN Meds: acetaminophen, benzonatate, ipratropium, levalbuterol, loperamide, ondansetron (ZOFRAN) IV  Home Meds: Prior to Admission medications   Medication Sig Start Date End Date Taking? Authorizing Provider  Phenyleph-Doxylamine-DM-APAP (NYQUIL SEVERE COLD/FLU) 5-6.25-10-325 MG CAPS Take 2 capsules by mouth daily as needed (COLD).   Yes [provider]  amoxicillin (AMOXIL) 875 MG tablet Take 1 tablet (875 mg total) by mouth 2 (two) times daily. Patient not taking: Reported on 08/03/2017 07/10/15   Carlisle Cater, PA-C  cyclobenzaprine (FLEXERIL) 5 MG tablet Take 2 tablets (10 mg total) by mouth 2 (two) times daily as needed for muscle spasms. Patient not taking: Reported on 08/03/2017 06/12/14   Delos Haring, PA-C  ibuprofen (ADVIL,MOTRIN) 800 MG tablet Take  1 tablet (800 mg total) by mouth 3 (three) times daily. Patient not taking: Reported on 08/03/2017 06/12/14   Delos Haring, PA-C  oxyCODONE-acetaminophen (PERCOCET/ROXICET) 5-325 MG per tablet Take 1 tablet by mouth every 6 (six) hours as needed for severe pain. Patient not taking: Reported on 08/03/2017 06/12/14   Delos Haring, PA-C    Allergies:   No  Known Allergies  Social History:   Social History   Socioeconomic History  . Marital status: Married    Spouse name: Not on file  . Number of children: Not on file  . Years of education: Not on file  . Highest education level: Not on file  Social Needs  . Financial resource strain: Not on file  . Food insecurity - worry: Not on file  . Food insecurity - inability: Not on file  . Transportation needs - medical: Not on file  . Transportation needs - non-medical: Not on file  Occupational History  . Not on file  Tobacco Use  . Smoking status: Current Every Day Smoker    Packs/day: 0.50  . Smokeless tobacco: Never Used  . Tobacco comment: Since age 26  Substance and Sexual Activity  . Alcohol use: Yes    Comment: Drinks heavy daily. At least 6pack per day.  . Drug use: Yes    Types: Marijuana  . Sexual activity: Not on file  Other Topics Concern  . Not on file  Social History Narrative  . Not on file    Family History:   The patient's family history includes Heart attack in his maternal grandfather.   ROS:  Please see the history of present illness.  All other ROS reviewed and negative.     Physical Exam/Data:   Vitals:   08/07/17 1954 08/07/17 2112 08/08/17 0518 08/08/17 0901  BP:  (!) 122/51 113/66   Pulse:  77 61   Resp:  17 18   Temp:  99.1 F (37.3 C) 100.3 F (37.9 C)   TempSrc:  Oral Oral   SpO2: 92% 98% 95% 92%  Weight:      Height:        Intake/Output Summary (Last 24 hours) at 08/08/2017 1141 Last data filed at 08/08/2017 1129 Gross per 24 hour  Intake 610 ml  Output 300 ml  Net 310 ml   Filed Weights   08/03/17 1734 08/06/17 0907 08/07/17 0500  Weight: 171 lb (77.6 kg) 143 lb 8.3 oz (65.1 kg) 138 lb 3.2 oz (62.7 kg)   Body mass index is 17.74 kg/m.  General: Well developed AAM, in no acute distress. Head: Normocephalic, atraumatic, sclera non-icteric, no xanthomas, nares are without discharge.  Neck: Negative for carotid bruits. JVD not  elevated. Lungs: Bibasilar crackles 1/2 way up with moderate air movement, no wheezing. Breathing is unlabored. Heart: Irregular, tachycardic, with S1 S2. No murmurs, rubs, or gallops appreciated. Abdomen: Soft, non-tender, non-distended with normoactive bowel sounds. No hepatomegaly. No rebound/guarding. No obvious abdominal masses. Msk:  Strength and tone appear normal for age. Extremities: No clubbing or cyanosis. No edema.  Distal pedal pulses are 2+ and equal bilaterally. Neuro: Alert and oriented X 3. No facial asymmetry. No focal deficit. Moves all extremities spontaneously. Psych:  Responds to questions appropriately with a normal affect.  EKG:  The EKG was personally reviewed and demonstrates irregular, narrow complex tachycardic rhythm with varying P wave morphologies and occasional PVCs suggestive of multifocal atrial tachycardia  Relevant CV Studies: pending  Laboratory Data:  Chemistry Recent Labs  Lab 08/06/17 1448 08/07/17 0558 08/08/17 0547  NA 128* 131* 132*  K 3.7 3.5 4.0  CL 93* 95* 95*  CO2 23 25 28   GLUCOSE 131* 115* 114*  BUN 7 9 10   CREATININE 0.80 0.69 0.80  CALCIUM 7.9* 7.9* 8.1*  GFRNONAA >60 >60 >60  GFRAA >60 >60 >60  ANIONGAP 12 11 9     Recent Labs  Lab 08/03/17 1815 08/04/17 0832  PROT 8.8* 7.0  ALBUMIN 4.0 2.9*  AST 183* 117*  ALT 66* 46  ALKPHOS 69 49  BILITOT 0.7 0.7   Hematology Recent Labs  Lab 08/06/17 0619 08/07/17 0558 08/08/17 0547  WBC 9.5 11.1* 11.2*  RBC 5.36 5.61 5.43  HGB 12.8* 13.6 13.0  HCT 36.5* 37.7* 37.1*  MCV 68.1* 67.2* 68.3*  MCH 23.9* 24.2* 23.9*  MCHC 35.1 36.1* 35.0  RDW 14.2 14.0 14.0  PLT 124* 124* 179   Cardiac Enzymes Recent Labs  Lab 08/04/17 0151 08/04/17 0832 08/04/17 1311  TROPONINI <0.03 0.03* 0.03*   No results for input(s): TROPIPOC in the last 168 hours.  BNP Recent Labs  Lab 08/07/17 1717  BNP 496.0*    Radiology/Studies:  Dg Chest 2 View  Result Date: 08/07/2017 CLINICAL  DATA:  Hypoxia EXAM: CHEST  2 VIEW COMPARISON:  08/03/2017 FINDINGS: Cardiac shadow is stable. Diffuse interstitial changes are noted bilaterally consistent with edema. No sizable effusion is noted. No bony abnormality is seen. IMPRESSION: Diffuse interstitial infiltrates bilaterally. Electronically Signed   By: Inez Catalina M.D.   On: 08/07/2017 14:45    Assessment and Plan:   1. Acute hypoxic respiratory failure with influenza A and probable superimposed PNA - CXR is more suggestive of pulmonary infiltrates than edema. His rising Hgb, tachycardia, and orthostasis are also suggestive that he may be on the drier side. Agree with holding off diuretics. Recommend consideration of pulmonary evaluation for escalation of treatment given failure to improve with continued low grade temp elevation. Will await echocardiogram. Given his EtOH and tachycardia, would not be surprised if there is some evidence of cardiomyopathy present.  2. Tachycardia - reported as improved with initial course of treatment; baseline EKG today shows what appears to be MAT likely due to underlying respiratory illness. Recommend to treat underlying illness. Have ordered telemetry stat. May also be driven by EtOH withdrawal as well. Will also check TSH. Current HR 105-110 but was as high as 150s earlier with acute dyspnea.   3. Orthostasis - suspect due to intravascular depletion. Hold off on further diuresis for now. Follow daily weights/I&Os.  4. Abnormal LFTs - repeat CMET in AM to trend. Question due to acute illness vs alcohol abuse.  5. Minimally elevated troponin  - nonspecific in setting of the above, low flat trend arguing against ACS. Check lipids in AM. Follow echo.  6. Alcohol abuse/tobacco abuse - counseled briefly on cessation. Will make IM aware so they may consider CIWA. Not sure a drug screen would be useful this may days out from admission.  For questions or updates, please contact [place] Please consult  [email or web address] for contact info under Cardiology/STEMI.  Signed, Charlie Pitter, PA-C  08/08/2017 11:41 AM   Attending Note:   The patient was seen and examined.  Agree with assessment and plan as noted above.  Changes made to the above note as needed.  Patient seen and independently examined with Melina Copa, PA .   We discussed all aspects of the encounter. I agree with the assessment and  plan as stated above.  1.  Acute shortness of breath: The patient has influenza A.  I suspect that this is his main issue.  Agree with getting an echocardiogram.  Given his alcohol intake, he would not be surprising if he had some degree of systolic dysfunction but I do not think that his acute shortness of breath is due to an exacerbation of CHF.  Think he should be adequately treated for his influenza A and then we can further assess his cardiac function.  2.  Multifocal atrial tachycardia.:  Patient has MAT.  This is due to his influenza A.  I suspect this will improve as he improves from his influenza.  We could consider using low-dose diltiazem to help with rate control if he remains tachycardic and have his left ventricular systolic function is normal by echo.  3.  Elevated troponin level: His troponin is 0.03.  This is insignificant and is almost certainly related to his influenza.  No further workup indicated at this time.    I have spent a total of 40 minutes with patient reviewing hospital  notes , telemetry, EKGs, labs and examining patient as well as establishing an assessment and plan that was discussed with the patient. > 50% of time was spent in direct patient care.    Thayer Headings, Brooke Bonito., MD, [facility] 08/08/2017, 12:38 PM [address],  [place] Pager [phone]

## 2017-08-08 NOTE — Consult Note (Signed)
 Name: Tyler [name] MRN: [id number] DOB: 02/09/1961    ADMISSION DATE:  08/03/2017 CONSULTATION DATE:  1/28  REFERRING MD :  Thompson  CHIEF COMPLAINT: on-going hypoxia   BRIEF PATIENT DESCRIPTION:   56-year-old male admitted on 1/23 with influenza A PCR positive and presumed secondary community-acquired pneumonia, treated with appropriate antibiotics as well as Tamiflu however in spite of therapy continues to have diffuse pulmonary infiltrates and hypoxia as of 1/28  SIGNIFICANT EVENTS  1/23 admitted, placed on Tamiflu, Rocephin and azithromycin 1/26 still short of breath, chest x-ray ordered.  1/27 chest x-ray with diffuse pulmonary infiltrates, Lasix given, orthostatic with this 1/28 cardiology asked to evaluate as well as pulmonary  STUDIES:  Echo 1/28:  Microbiology: Blood culture 1/24>>> GI PCR panel 1/24: Negative Differential PCR 1/24: Negative HIV antibody 1/24: Negative Influenza PCR 1/23+ for influenza A Urine strep and Legionella antigen on 1/23: Negative   Antibiotics/antimicrobials: Azithromycin 1/23-1/27 Rocephin 123-1/27 Augmentin 1/27>>> Tamiflu 1/23>>>  HISTORY OF PRESENT ILLNESS:   This is a 56-year-old male who was admitted on 1/23 with chief complaint of cough with sputum production, fever and shortness of breath.  His influenza panel was positive for influenza A he was admitted with a working diagnosis of    influenza    and suspected secondary   community-acquired pneumonia given radiographic findings and left lower lobe. He was treated with Tamiflu, azithromycin and ceftriaxone, as well as supplemental oxygen his O2 sats were in the 80s.  His urine strep and Legionella antigens were negative.  And in spite of above therapies he still continued to complain of worsening shortness of breath with room air saturations as low as 82%.  Follow-up chest x-ray was obtained on 1/26 this demonstrated evolution of diffuse interstitial pulmonary infiltrates which  were not present on initial film.  He was given IV Lasix given concern that this could perhaps represent evolving pulmonary edema.  This was complicated by orthostasis.  Because of this cardiology was consulted and echocardiogram is pending.  In spite of diuresis his chest x-ray continues to demonstrate extensive bilateral airspace disease, the right is worse than the left however there does appear to be some mild improvement.  Pulmonary has been asked to evaluate given his pulmonary infiltrates as well as persistent hypoxia in spite of appropriate therapies.  PAST MEDICAL HISTORY :   has a past medical history of Alcohol abuse, Chronic back pain, and Tobacco abuse.  has no past surgical history on file. Prior to Admission medications   Medication Sig Start Date End Date Taking? Authorizing Provider  Phenyleph-Doxylamine-DM-APAP (NYQUIL SEVERE COLD/FLU) 5-6.25-10-325 MG CAPS Take 2 capsules by mouth daily as needed (COLD).   Yes [provider]  amoxicillin (AMOXIL) 875 MG tablet Take 1 tablet (875 mg total) by mouth 2 (two) times daily. Patient not taking: Reported on 08/03/2017 07/10/15   Geiple, Joshua, PA-C  cyclobenzaprine (FLEXERIL) 5 MG tablet Take 2 tablets (10 mg total) by mouth 2 (two) times daily as needed for muscle spasms. Patient not taking: Reported on 08/03/2017 06/12/14   Greene, Tiffany, PA-C  ibuprofen (ADVIL,MOTRIN) 800 MG tablet Take 1 tablet (800 mg total) by mouth 3 (three) times daily. Patient not taking: Reported on 08/03/2017 06/12/14   Greene, Tiffany, PA-C  oxyCODONE-acetaminophen (PERCOCET/ROXICET) 5-325 MG per tablet Take 1 tablet by mouth every 6 (six) hours as needed for severe pain. Patient not taking: Reported on 08/03/2017 06/12/14   Greene, Tiffany, PA-C   No Known Allergies  FAMILY   HISTORY:  family history includes Heart attack in his maternal grandfather. SOCIAL HISTORY:  reports that he has been smoking.  He has been smoking about 0.50 packs per day. he  has never used smokeless tobacco. He reports that he drinks alcohol. He reports that he uses drugs. Drug: Marijuana.  REVIEW OF SYSTEMS:   Constitutional: Negative for fever, chills, weight loss, + malaise/fatigue and diaphoresis.  HENT: Negative for hearing loss, ear pain, nosebleeds, congestion, sore throat, neck pain, tinnitus and ear discharge.   Eyes: Negative for blurred vision, double vision, photophobia, pain, discharge and redness.  Respiratory: Negative for cough, hemoptysis, sputum production, + shortness of breath, wheezing and stridor.   Cardiovascular: Negative for chest pain, palpitations, orthopnea, claudication, leg swelling and PND.  Gastrointestinal: Negative for heartburn, nausea, vomiting, abdominal pain, diarrhea, constipation, blood in stool and melena.  Genitourinary: Negative for dysuria, urgency, frequency, hematuria and flank pain.  Musculoskeletal: Negative for myalgias, back pain, joint pain and falls.  Skin: Negative for itching and rash.  Neurological: Negative for dizziness, tingling, tremors, sensory change, speech change, focal weakness, seizures, loss of consciousness, weakness and headaches.  Endo/Heme/Allergies: Negative for environmental allergies and polydipsia. Does not bruise/bleed easily.  SUBJECTIVE:  Still does not feel well  VITAL SIGNS: Temp:  [97.9 F (36.6 C)-100.3 F (37.9 C)] 100.3 F (37.9 C) (01/28 0518) Pulse Rate:  [59-112] 61 (01/28 0518) Resp:  [17-23] 18 (01/28 0518) BP: (113-153)/(51-92) 113/66 (01/28 0518) SpO2:  [92 %-99 %] 92 % (01/28 0901)  PHYSICAL EXAMINATION: General: Nontoxic-appearing, well-developed 57 year old male currently resting comfortably in bed Neuro: Awake alert oriented no focal deficits HEENT: Normocephalic atraumatic mucous membranes are moist no jugular venous distention Cardiovascular: Regular rate and rhythm without murmur rub or gallop Lungs: Basilar posterior rales no accessory use equal chest rise no  chest pain Abdomen: Soft nontender positive bowel sounds no organomegaly Musculoskeletal: Equal strength and bulk Skin: Warm and dry  Recent Labs  Lab 08/06/17 1448 08/07/17 0558 08/08/17 0547  NA 128* 131* 132*  K 3.7 3.5 4.0  CL 93* 95* 95*  CO2 _0 BUN _1 CREATININE 0.80 0.69 0.80  GLUCOSE 131* 115* 114*   Recent Labs  Lab 08/06/17 0619 08/07/17 0558 08/08/17 0547  HGB 12.8* 13.6 13.0  HCT 36.5* 37.7* 37.1*  WBC 9.5 11.1* 11.2*  PLT 124* 124* 179   Dg Chest 2 View  Result Date: 08/08/2017 CLINICAL DATA:  Hypoxia. Increased weakness and shortness of breath this morning. EXAM: CHEST  2 VIEW COMPARISON:  08/07/2017 and 08/03/2017 FINDINGS: Extensive bilateral upper and lower lobe and middle lobe hazy infiltrates persist, worse on the right than the left. Slight progression in the right midzone. Slight improvement on the left. No effusions. Heart size and pulmonary vascularity are normal. No bone abnormality. IMPRESSION: Persistent extensive bilateral pulmonary infiltrates, slightly progressed on the right and slightly improved on the left. Electronically Signed   By: Lorriane Shire M.D.   On: 08/08/2017 11:43   Dg Chest 2 View  Result Date: 08/07/2017 CLINICAL DATA:  Hypoxia EXAM: CHEST  2 VIEW COMPARISON:  08/03/2017 FINDINGS: Cardiac shadow is stable. Diffuse interstitial changes are noted bilaterally consistent with edema. No sizable effusion is noted. No bony abnormality is seen. IMPRESSION: Diffuse interstitial infiltrates bilaterally. Electronically Signed   By: Inez Catalina M.D.   On: 08/07/2017 14:45    ASSESSMENT / PLAN:  Acute Hypoxic respiratory failure in setting of bilateral pulmonary infiltrates: suspect viral pneumonitis  in setting of influenza A vs ALI  vs Organizing PNA following viral PNA.  Plan Check procalcitonin Check ESR Day #6 of  antibiotics; I think could complete 8 days total We will plan on systemic steroid trial Repeat chest x-ray  a.m.  Peter E Babcock ACNP-BC [place] [facility] Pager # [phone] OR # [phone] if no answer   08/08/2017, 1:33 PM  

## 2017-08-08 NOTE — Progress Notes (Signed)
On room air, at rest; patient's spO2 = 71% While ambulating from his room to nursing station and back; on room air; spO2= 70%. Put patient on oxygen 1L; spO2= 72%; on 2L spO2= 72%; on 3L spO2= 73%; on 4L spO2= 88%-92%.

## 2017-08-09 ENCOUNTER — Inpatient Hospital Stay (HOSPITAL_COMMUNITY): Payer: Self-pay

## 2017-08-09 ENCOUNTER — Other Ambulatory Visit (HOSPITAL_COMMUNITY): Payer: Self-pay

## 2017-08-09 DIAGNOSIS — S27309A Unspecified injury of lung, unspecified, initial encounter: Secondary | ICD-10-CM

## 2017-08-09 DIAGNOSIS — I361 Nonrheumatic tricuspid (valve) insufficiency: Secondary | ICD-10-CM

## 2017-08-09 DIAGNOSIS — S27309D Unspecified injury of lung, unspecified, subsequent encounter: Secondary | ICD-10-CM

## 2017-08-09 DIAGNOSIS — J189 Pneumonia, unspecified organism: Secondary | ICD-10-CM

## 2017-08-09 DIAGNOSIS — J8489 Other specified interstitial pulmonary diseases: Secondary | ICD-10-CM

## 2017-08-09 DIAGNOSIS — J111 Influenza due to unidentified influenza virus with other respiratory manifestations: Secondary | ICD-10-CM

## 2017-08-09 DIAGNOSIS — E43 Unspecified severe protein-calorie malnutrition: Secondary | ICD-10-CM

## 2017-08-09 LAB — COMPREHENSIVE METABOLIC PANEL
ALBUMIN: 2.4 g/dL — AB (ref 3.5–5.0)
ALT: 30 U/L (ref 17–63)
ANION GAP: 9 (ref 5–15)
AST: 46 U/L — AB (ref 15–41)
Alkaline Phosphatase: 44 U/L (ref 38–126)
BILIRUBIN TOTAL: 1 mg/dL (ref 0.3–1.2)
BUN: 12 mg/dL (ref 6–20)
CHLORIDE: 98 mmol/L — AB (ref 101–111)
CO2: 26 mmol/L (ref 22–32)
Calcium: 7.8 mg/dL — ABNORMAL LOW (ref 8.9–10.3)
Creatinine, Ser: 0.56 mg/dL — ABNORMAL LOW (ref 0.61–1.24)
GFR calc Af Amer: >60 mL/min (ref >60)
GFR calc non Af Amer: >60 mL/min (ref >60)
GLUCOSE: 146 mg/dL — AB (ref 65–99)
POTASSIUM: 4 mmol/L (ref 3.5–5.1)
Sodium: 133 mmol/L — ABNORMAL LOW (ref 135–145)
TOTAL PROTEIN: 6.6 g/dL (ref 6.5–8.1)

## 2017-08-09 LAB — LIPID PANEL
CHOL/HDL RATIO: 4.3 ratio
Cholesterol: 104 mg/dL (ref 0–200)
HDL: 24 mg/dL — AB (ref >40)
LDL Cholesterol: 60 mg/dL (ref 0–99)
Triglycerides: 100 mg/dL (ref <150)
VLDL: 20 mg/dL (ref 0–40)

## 2017-08-09 LAB — CULTURE, BLOOD (ROUTINE X 2)
CULTURE: NO GROWTH
Culture: NO GROWTH
SPECIAL REQUESTS: ADEQUATE
SPECIAL REQUESTS: ADEQUATE

## 2017-08-09 LAB — CBC
HEMATOCRIT: 34.7 % — AB (ref 39.0–52.0)
Hemoglobin: 12.1 g/dL — ABNORMAL LOW (ref 13.0–17.0)
MCH: 23.7 pg — ABNORMAL LOW (ref 26.0–34.0)
MCHC: 34.9 g/dL (ref 30.0–36.0)
MCV: 68 fL — ABNORMAL LOW (ref 78.0–100.0)
PLATELETS: 224 10*3/uL (ref 150–400)
RBC: 5.1 MIL/uL (ref 4.22–5.81)
RDW: 14 % (ref 11.5–15.5)
WBC: 9.1 10*3/uL (ref 4.0–10.5)

## 2017-08-09 LAB — ECHOCARDIOGRAM COMPLETE
Height: 74 in
Weight: 2244.8 oz

## 2017-08-09 LAB — MAGNESIUM: MAGNESIUM: 2 mg/dL (ref 1.7–2.4)

## 2017-08-09 LAB — TROPONIN I: Troponin I: <0.03 ng/mL (ref <0.03)

## 2017-08-09 LAB — PHOSPHORUS: Phosphorus: 3.3 mg/dL (ref 2.5–4.6)

## 2017-08-09 MED ORDER — FUROSEMIDE 10 MG/ML IJ SOLN
20.0000 mg | Freq: Once | INTRAMUSCULAR | Status: AC
  Administered 2017-08-09: 20 mg via INTRAVENOUS
  Filled 2017-08-09: qty 2

## 2017-08-09 MED ORDER — POTASSIUM CHLORIDE CRYS ER 20 MEQ PO TBCR
40.0000 meq | EXTENDED_RELEASE_TABLET | Freq: Once | ORAL | Status: AC
  Administered 2017-08-09: 40 meq via ORAL
  Filled 2017-08-09: qty 2

## 2017-08-09 MED ORDER — LEVALBUTEROL HCL 0.63 MG/3ML IN NEBU
0.6300 mg | INHALATION_SOLUTION | Freq: Three times a day (TID) | RESPIRATORY_TRACT | Status: DC
  Administered 2017-08-09 – 2017-08-11 (×7): 0.63 mg via RESPIRATORY_TRACT
  Filled 2017-08-09 (×7): qty 3

## 2017-08-09 MED ORDER — IPRATROPIUM BROMIDE 0.02 % IN SOLN
0.5000 mg | Freq: Three times a day (TID) | RESPIRATORY_TRACT | Status: DC
  Administered 2017-08-09 – 2017-08-11 (×7): 0.5 mg via RESPIRATORY_TRACT
  Filled 2017-08-09 (×7): qty 2.5

## 2017-08-09 MED ORDER — ADULT MULTIVITAMIN W/MINERALS CH
1.0000 | ORAL_TABLET | Freq: Every day | ORAL | Status: DC
  Administered 2017-08-09 – 2017-08-11 (×3): 1 via ORAL
  Filled 2017-08-09 (×3): qty 1

## 2017-08-09 MED ORDER — ENSURE ENLIVE PO LIQD
237.0000 mL | Freq: Three times a day (TID) | ORAL | Status: DC
  Administered 2017-08-09 – 2017-08-11 (×6): 237 mL via ORAL

## 2017-08-09 MED ORDER — SODIUM CHLORIDE 0.9 % IV SOLN
INTRAVENOUS | Status: AC
  Administered 2017-08-10: 06:00:00 via INTRAVENOUS

## 2017-08-09 MED ORDER — LEVALBUTEROL HCL 0.63 MG/3ML IN NEBU: 0.6300 mg | INHALATION_SOLUTION | RESPIRATORY_TRACT | Status: DC | PRN

## 2017-08-09 NOTE — Progress Notes (Signed)
PROGRESS NOTE    Tyler [name]  RSW:[id number] DOB: 1961/07/10 DOA: 08/03/2017 PCP: Patient, No Pcp Per   Brief Narrative:  Patient is a 57 year old with chronic back pain presented with cough productive sputum, fever, shortness of breath and noted to have influenza A and community-acquired pneumonia as well as loose stools.  Patient empirically on IV antibiotics and Tamiflu.  During the hospitalization patient had worsening respiratory status with worsening hypoxia and chest x-ray worrisome for bilateral pulmonary infiltrates.  Concern for viral pneumonitis versus acute lung injury versus organizing pneumonia.  Pulmonary and cardiology consulted and following.  Patient started on IV steroids with some clinical improvement.   Assessment & Plan:   Principal Problem:   Acute respiratory failure with hypoxia (HCC) Active Problems:   CAP (community acquired pneumonia)   BOOP (bronchiolitis obliterans with organizing pneumonia) ([place])   Influenza A   Acute lung injury   Pneumonitis   Hyponatremia   Tachycardia   Dehydration   Diarrhea   Hypoxia   Protein-calorie malnutrition, severe   #1 acute respiratory distress with hypoxia in the setting of bilateral pulmonary infiltrates: Likely viral pneumonitis in the setting of influenza A and community-acquired pneumonia versus acute lung injury versus organizing pneumonia. Patient had presented on admission with cough with productive sputum, fever, shortness of breath, loose stools.  Patient noted to be positive for influenza A chest x-ray consistent with a community-acquired pneumonia.  Urine strep pneumococcus antigen negative.  Urine Legionella antigen negative. Blood cultures with no growth to date.  Sputum Gram stain and culture negative.  Patient noted to desat with sats of 82% the morning of 08/06/2017.  Patient with crackles noted in the bases.  Given IV Lasix x2 doses with urine output of 1.825 L.  Hypoxia initially improved with sats of  89% on room air and dropping to 84% on ambulation on 2 L.  Patient noted to be orthostatic and as such IV diuretics discontinued.  Patient noted to have continued hypoxia and as such chest x-ray obtained 08/07/2017 with worsening bilateral infiltrates.  Patient does not significantly look volume overloaded on examination and likely from worsening pulmonary illness.  Patient still hypoxic.  Repeat chest x-ray the morning of 08/08/2017 and 08/09/2017 with persistent bilateral pulmonary infiltrates without significant change.  2D echo pending.  Patient was maintained on Pulmicort, Flonase, Mucinex, scheduled nebulizers, Claritin, Protonix, Tamiflu.  Discontinued IV Rocephin and IV azithromycin and placed on oral Augmentin to complete course of antibiotic treatment.  Due to worsening hypoxia and respiratory status pulmonary was consulted and concern for acute lung injury versus viral pneumonitis versus organizing pneumonia was thought to be patient's current diagnosis.  Patient has been started on IV steroids with some clinical improvement.  Pulmonary following.  Cardiology also assessed the patient due to concerns for volume overload.  2D echo pending.  Appreciate pulmonary and cardiology input and recommendations.    2.  Diarrhea Likely a viral gastroenteritis.  C. difficile PCR negative.  GI pathogen panel negative.  Improved on Imodium.   3.  Hyponatremia Initially felt likely secondary to dehydration.  Hyponatremia improved with diuresis.  Patient with bibasilar crackles and concern for some volume overload secondary to aggressive hydration on admission.  IV fluids were saline locked.  Patient received IV Lasix x2 doses with good urine output.  Hyponatremia improved.  Patient is now receiving gentle hydration follow.  Follow.   4.  Tachycardia/MAT Likely secondary to acute illness and dehydration.  Patient still with some tachycardia.  EKG consistent with MAT.  Placed on telemetry per cardiology.  Continue  treatment for acute illness.  Follow.  5.  Orthostasis Likely secondary to IV diuretics that patient received.  Patient with a urine output of 150 cc over the past 24 hours.  Patient with some improvement with orthostasis and less dizzy today.  Continue gentle hydration for another 24 hours.  Repeat orthostatics in the morning.  6.  Protein calorie malnutrition Nutritional supplementation.    DVT prophylaxis: Lovenox Code Status: Full Family Communication: Updated patient.  No family present. Disposition Plan: Home when clinically improved with resolution of hypoxia and per pulmonary.   Consultants:   Cardiology: Dr Acie Fredrickson 08/08/2017  Pulmonary: Dr. Chase Caller 08/08/2017  Procedures:   Chest x-ray 08/03/2017, 08/08/2017, 08/09/2017  2D echo 08/09/2017  Antimicrobials:   IV Rocephin 08/04/2017>>>>> 08/07/2017  IV azithromycin 08/04/2017>>>>>> 08/07/2017  Oral Augmentin 08/07/2017   Subjective: Patient sitting up in bed.  Patient states feeling better than he did yesterday however not at baseline.  Patient states he is able to sit upright today.  Patient states some dizziness however improving.  No chest pain.   Objective: Vitals:   08/09/17 0537 08/09/17 0849 08/09/17 1000 08/09/17 1411  BP: 120/71     Pulse: 76     Resp: 19     Temp: 97.8 F (36.6 C)     TempSrc: Oral     SpO2: 92% 98%  94%  Weight:   63.6 kg (140 lb 4.8 oz)   Height:   6\' 2"  (1.88 m)     Intake/Output Summary (Last 24 hours) at 08/09/2017 1821 Last data filed at 08/09/2017 1803 Gross per 24 hour  Intake 1767.5 ml  Output 650 ml  Net 1117.5 ml   Filed Weights   08/06/17 0907 08/07/17 0500 08/09/17 1000  Weight: 65.1 kg (143 lb 8.3 oz) 62.7 kg (138 lb 3.2 oz) 63.6 kg (140 lb 4.8 oz)    Examination:  General exam: Sitting up in bed. Respiratory system: Patient with some decreased breath sounds in the bases.  Some scattered crackles.  No wheezing.   Cardiovascular system: Regular rate and rhythm  no murmurs rubs or gallops.  No JVD.  No edema.  Gastrointestinal system: Abdomen is soft, nontender, nondistended, positive bowel sounds.  No hepatosplenomegaly.  Central nervous system: Alert and oriented. No focal neurological deficits. Extremities: Symmetric 5 x 5 power. Skin: No rashes, lesions or ulcers Psychiatry: Judgement and insight appear normal. Mood & affect appropriate.     Data Reviewed: I have personally reviewed following labs and imaging studies  CBC: Recent Labs  Lab 08/04/17 0832 08/05/17 0547 08/06/17 0619 08/07/17 0558 08/08/17 0547 08/09/17 0612  WBC 8.9 6.3 9.5 11.1* 11.2* 9.1  NEUTROABS 6.8  --   --   --  7.7  --   HGB 12.1* 11.9* 12.8* 13.6 13.0 12.1*  HCT 34.7* 34.0* 36.5* 37.7* 37.1* 34.7*  MCV 69.4* 68.5* 68.1* 67.2* 68.3* 68.0*  PLT 137* 124* 124* 124* 179 496   Basic Metabolic Panel: Recent Labs  Lab 08/04/17 0832 08/05/17 0547 08/06/17 0619 08/06/17 1448 08/07/17 0558 08/08/17 0547 08/09/17 0612  NA 131* 128* 124* 128* 131* 132* 133*  K 3.4* 3.0* 3.5 3.7 3.5 4.0 4.0  CL 97* 95* 94* 93* 95* 95* 98*  CO2 24 23 23 23 25 28 26   GLUCOSE 102* 107* 115* 131* 115* 114* 146*  BUN 15 8 6 7 9 10 12   CREATININE 0.72 0.62 0.67 0.80  0.69 0.80 0.56*  CALCIUM 8.1* 7.6* 7.3* 7.9* 7.9* 8.1* 7.8*  MG 1.7 1.5* 1.8  --   --   --  2.0  PHOS  --   --   --   --   --   --  3.3   GFR: Estimated Creatinine Clearance: 90.5 mL/min (A) (by C-G formula based on SCr of 0.56 mg/dL (L)). Liver Function Tests: Recent Labs  Lab 08/03/17 1815 08/04/17 0832 08/09/17 0612  AST 183* 117* 46*  ALT 66* 46 30  ALKPHOS 69 49 44  BILITOT 0.7 0.7 1.0  PROT 8.8* 7.0 6.6  ALBUMIN 4.0 2.9* 2.4*   Recent Labs  Lab 08/03/17 1815 08/04/17 0832  LIPASE 62* 72*   No results for input(s): AMMONIA in the last 168 hours. Coagulation Profile: No results for input(s): INR, PROTIME in the last 168 hours. Cardiac Enzymes: Recent Labs  Lab 08/04/17 0151 08/04/17 0832  08/04/17 1311 08/09/17 0612  TROPONINI <0.03 0.03* 0.03* <0.03   BNP (last 3 results) No results for input(s): PROBNP in the last 8760 hours. HbA1C: No results for input(s): HGBA1C in the last 72 hours. CBG: No results for input(s): GLUCAP in the last 168 hours. Lipid Profile: Recent Labs    08/09/17 0612  CHOL 104  HDL 24*  LDLCALC 60  TRIG 100  CHOLHDL 4.3   Thyroid Function Tests: Recent Labs    08/08/17 1222  TSH 1.515   Anemia Panel: No results for input(s): VITAMINB12, FOLATE, FERRITIN, TIBC, IRON, RETICCTPCT in the last 72 hours. Sepsis Labs: Recent Labs  Lab 08/03/17 2232 08/04/17 0033 08/04/17 0507 08/08/17 1420  PROCALCITON  --   --   --  1.63  LATICACIDVEN 2.4* 3.1* 1.3  --     Recent Results (from the past 240 hour(s))  Culture, blood (routine x 2) Call MD if unable to obtain prior to antibiotics being given     Status: None   Collection Time: 08/04/17  1:51 AM  Result Value Ref Range Status   Specimen Description BLOOD LEFT ANTECUBITAL  Final   Special Requests   Final    BOTTLES DRAWN AEROBIC AND ANAEROBIC Blood Culture adequate volume   Culture   Final    NO GROWTH 5 DAYS Performed at [facility], [place] [address] [place], [place] [address]    Report Status 08/09/2017 FINAL  Final  Culture, blood (routine x 2) Call MD if unable to obtain prior to antibiotics being given     Status: None   Collection Time: 08/04/17  2:05 AM  Result Value Ref Range Status   Specimen Description BLOOD LEFT HAND  Final   Special Requests IN PEDIATRIC BOTTLE Blood Culture adequate volume  Final   Culture   Final    NO GROWTH 5 DAYS Performed at [facility] [address] [place], [place] [address]    Report Status 08/09/2017 FINAL  Final  C difficile quick scan w PCR reflex     Status: None   Collection Time: 08/04/17  6:55 PM  Result Value Ref Range Status   C Diff antigen NEGATIVE NEGATIVE Final   C Diff toxin NEGATIVE NEGATIVE Final   C  Diff interpretation No C. difficile detected.  Final  Gastrointestinal Panel by PCR , Stool     Status: None   Collection Time: 08/04/17  6:55 PM  Result Value Ref Range Status   Campylobacter species NOT DETECTED NOT DETECTED Final   Plesimonas shigelloides  NOT DETECTED NOT DETECTED Final   Salmonella species NOT DETECTED NOT DETECTED Final   Yersinia enterocolitica NOT DETECTED NOT DETECTED Final   Vibrio species NOT DETECTED NOT DETECTED Final   Vibrio cholerae NOT DETECTED NOT DETECTED Final   Enteroaggregative E coli (EAEC) NOT DETECTED NOT DETECTED Final   Enteropathogenic E coli (EPEC) NOT DETECTED NOT DETECTED Final   Enterotoxigenic E coli (ETEC) NOT DETECTED NOT DETECTED Final   Shiga like toxin producing E coli (STEC) NOT DETECTED NOT DETECTED Final   Shigella/Enteroinvasive E coli (EIEC) NOT DETECTED NOT DETECTED Final   Cryptosporidium NOT DETECTED NOT DETECTED Final   Cyclospora cayetanensis NOT DETECTED NOT DETECTED Final   Entamoeba histolytica NOT DETECTED NOT DETECTED Final   Giardia lamblia NOT DETECTED NOT DETECTED Final   Adenovirus F40/41 NOT DETECTED NOT DETECTED Final   Astrovirus NOT DETECTED NOT DETECTED Final   Norovirus GI/GII NOT DETECTED NOT DETECTED Final   Rotavirus A NOT DETECTED NOT DETECTED Final   Sapovirus (I, II, IV, and V) NOT DETECTED NOT DETECTED Final    Comment: Performed at [facility], [address] [place], [place] [address]  Culture, sputum-assessment     Status: None   Collection Time: 08/04/17 11:45 PM  Result Value Ref Range Status   Specimen Description SPUTUM  Final   Special Requests Normal  Final   Sputum evaluation   Final    Sputum specimen not acceptable for testing.  Please recollect.   NOTIFIED DEBBIE HILL RN 1.25.19 @0211  ZANDO,C    Report Status 08/05/2017 FINAL  Final  Respiratory Panel by PCR     Status: None   Collection Time: 08/08/17  3:42 PM  Result Value Ref Range Status   Adenovirus NOT DETECTED  NOT DETECTED Final   Coronavirus 229E NOT DETECTED NOT DETECTED Final   Coronavirus HKU1 NOT DETECTED NOT DETECTED Final   Coronavirus NL63 NOT DETECTED NOT DETECTED Final   Coronavirus OC43 NOT DETECTED NOT DETECTED Final   Metapneumovirus NOT DETECTED NOT DETECTED Final   Rhinovirus / Enterovirus NOT DETECTED NOT DETECTED Final   Influenza A NOT DETECTED NOT DETECTED Final   Influenza B NOT DETECTED NOT DETECTED Final   Parainfluenza Virus 1 NOT DETECTED NOT DETECTED Final   Parainfluenza Virus 2 NOT DETECTED NOT DETECTED Final   Parainfluenza Virus 3 NOT DETECTED NOT DETECTED Final   Parainfluenza Virus 4 NOT DETECTED NOT DETECTED Final   Respiratory Syncytial Virus NOT DETECTED NOT DETECTED Final   Bordetella pertussis NOT DETECTED NOT DETECTED Final   Chlamydophila pneumoniae NOT DETECTED NOT DETECTED Final   Mycoplasma pneumoniae NOT DETECTED NOT DETECTED Final    Comment: Performed at [facility], [place]. [address], [place], [place] [address]         Radiology Studies: Dg Chest 2 View  Result Date: 08/09/2017 CLINICAL DATA:  Shortness of breath. EXAM: CHEST  2 VIEW COMPARISON:  08/08/2017.  08/07/2017.  08/03/2017. FINDINGS: Heart size normal. Diffuse bilateral pulmonary interstitial prominence again noted without interim change. Small bilateral pleural effusions noted. No pneumothorax. Heart size stable. No acute bony abnormality. IMPRESSION: Persistent bilateral pulmonary interstitial without significant change from prior exam. Small bilateral pleural effusions. Electronically Signed   By: Marcello Moores  Register   On: 08/09/2017 10:56   Dg Chest 2 View  Result Date: 08/08/2017 CLINICAL DATA:  Hypoxia. Increased weakness and shortness of breath this morning. EXAM: CHEST  2 VIEW COMPARISON:  08/07/2017 and 08/03/2017 FINDINGS: Extensive bilateral upper and  lower lobe and middle lobe hazy infiltrates persist, worse on the right than the left. Slight progression in the right  midzone. Slight improvement on the left. No effusions. Heart size and pulmonary vascularity are normal. No bone abnormality. IMPRESSION: Persistent extensive bilateral pulmonary infiltrates, slightly progressed on the right and slightly improved on the left. Electronically Signed   By: Lorriane Shire M.D.   On: 08/08/2017 11:43        Scheduled Meds: . amoxicillin-clavulanate  1 tablet Oral Q12H  . budesonide (PULMICORT) nebulizer solution  0.5 mg Nebulization BID  . enoxaparin (LOVENOX) injection  40 mg Subcutaneous Q24H  . feeding supplement (ENSURE ENLIVE)  237 mL Oral TID BM  . fluticasone  2 spray Each Nare Daily  . guaiFENesin  1,200 mg Oral BID  . ipratropium  0.5 mg Nebulization TID  . levalbuterol  0.63 mg Nebulization TID  . loratadine  10 mg Oral Daily  . methylPREDNISolone (SOLU-MEDROL) injection  60 mg Intravenous Q6H  . multivitamin with minerals  1 tablet Oral Daily  . pantoprazole  40 mg Oral Q0600   Continuous Infusions: . sodium chloride 75 mL/hr at 08/09/17 1126     LOS: 5 days    Time spent: 40 mins    Irine Seal, MD [facility] Pager [phone]  If 7PM-7AM, please contact night-coverage [email or web address] Password [email or web address] 08/09/2017, 6:20 PM

## 2017-08-09 NOTE — Progress Notes (Addendum)
Progress Note  Patient Name: Tyler [name] Date of Encounter: 08/09/2017  Primary Cardiologist: Dr. Acie Fredrickson  Subjective   Pt feels significantly better this AM (steroids added by pulm yesterday). He does not feel at all yet back to baseline but feels he is improved since yesterday. No CP.  Inpatient Medications    Scheduled Meds: . amoxicillin-clavulanate  1 tablet Oral Q12H  . budesonide (PULMICORT) nebulizer solution  0.5 mg Nebulization BID  . enoxaparin (LOVENOX) injection  40 mg Subcutaneous Q24H  . fluticasone  2 spray Each Nare Daily  . guaiFENesin  1,200 mg Oral BID  . ipratropium  0.5 mg Nebulization TID  . levalbuterol  0.63 mg Nebulization TID  . loratadine  10 mg Oral Daily  . methylPREDNISolone (SOLU-MEDROL) injection  60 mg Intravenous Q6H  . pantoprazole  40 mg Oral Q0600   Continuous Infusions: . sodium chloride 75 mL/hr at 08/09/17 1000   PRN Meds: acetaminophen, benzonatate, ipratropium **AND** levalbuterol, loperamide, ondansetron (ZOFRAN) IV   Vital Signs    Vitals:   08/08/17 2032 08/08/17 2123 08/09/17 0537 08/09/17 0849  BP:  126/85 120/71   Pulse: (!) 111 86 76   Resp: 20 18 19    Temp:  (!) 97.5 F (36.4 C) 97.8 F (36.6 C)   TempSrc:  Oral Oral   SpO2: 95% 98% 92% 98%  Weight:      Height:        Intake/Output Summary (Last 24 hours) at 08/09/2017 1018 Last data filed at 08/09/2017 1000 Gross per 24 hour  Intake 1505 ml  Output 150 ml  Net 1355 ml   Filed Weights   08/03/17 1734 08/06/17 0907 08/07/17 0500  Weight: 171 lb (77.6 kg) 143 lb 8.3 oz (65.1 kg) 138 lb 3.2 oz (62.7 kg)    Telemetry    MAT HR 95-120s (occasional spikes up to the 140s-170s, suspect related to activity) - Personally Reviewed  Physical Exam   GEN: No acute distress.  HEENT: Normocephalic, atraumatic, sclera non-icteric. Neck: No JVD or bruits. Cardiac: RRR no murmurs, rubs, or gallops.  Radials/DP/PT 1+ and equal bilaterally.  Respiratory:  Bilateral crackles 1/2 way up, less prominent than yesterday. No wheezing. Breathing is unlabored. GI: Soft, nontender, non-distended, BS +x 4. MS: no deformity. Extremities: No clubbing or cyanosis. No edema. Distal pedal pulses are 2+ and equal bilaterally. Neuro:  AAOx3. Follows commands. Psych:  Responds to questions appropriately with a normal affect.  Labs    Chemistry Recent Labs  Lab 08/03/17 1815 08/04/17 1287  08/07/17 0558 08/08/17 0547 08/09/17 0612  NA 128* 131*   < > 131* 132* 133*  K 3.5 3.4*   < > 3.5 4.0 4.0  CL 91* 97*   < > 95* 95* 98*  CO2 23 24   < > 25 28 26   GLUCOSE 126* 102*   < > 115* 114* 146*  BUN 24* 15   < > 9 10 12   CREATININE 1.21 0.72   < > 0.69 0.80 0.56*  CALCIUM 9.4 8.1*   < > 7.9* 8.1* 7.8*  PROT 8.8* 7.0  --   --   --  6.6  ALBUMIN 4.0 2.9*  --   --   --  2.4*  AST 183* 117*  --   --   --  46*  ALT 66* 46  --   --   --  30  ALKPHOS 69 49  --   --   --  44  BILITOT 0.7 0.7  --   --   --  1.0  GFRNONAA >60 >60   < > >60 >60 >60  GFRAA >60 >60   < > >60 >60 >60  ANIONGAP 14 10   < > 11 9 9    < > = values in this interval not displayed.     Hematology Recent Labs  Lab 08/07/17 0558 08/08/17 0547 08/09/17 0612  WBC 11.1* 11.2* 9.1  RBC 5.61 5.43 5.10  HGB 13.6 13.0 12.1*  HCT 37.7* 37.1* 34.7*  MCV 67.2* 68.3* 68.0*  MCH 24.2* 23.9* 23.7*  MCHC 36.1* 35.0 34.9  RDW 14.0 14.0 14.0  PLT 124* 179 224    Cardiac Enzymes Recent Labs  Lab 08/04/17 0151 08/04/17 0832 08/04/17 1311 08/09/17 0612  TROPONINI <0.03 0.03* 0.03* <0.03   No results for input(s): TROPIPOC in the last 168 hours.   BNP Recent Labs  Lab 08/07/17 1717  BNP 496.0*     DDimer No results for input(s): DDIMER in the last 168 hours.   Radiology    Dg Chest 2 View  Result Date: 08/08/2017 CLINICAL DATA:  Hypoxia. Increased weakness and shortness of breath this morning. EXAM: CHEST  2 VIEW COMPARISON:  08/07/2017 and 08/03/2017 FINDINGS: Extensive  bilateral upper and lower lobe and middle lobe hazy infiltrates persist, worse on the right than the left. Slight progression in the right midzone. Slight improvement on the left. No effusions. Heart size and pulmonary vascularity are normal. No bone abnormality. IMPRESSION: Persistent extensive bilateral pulmonary infiltrates, slightly progressed on the right and slightly improved on the left. Electronically Signed   By: Lorriane Shire M.D.   On: 08/08/2017 11:43   Dg Chest 2 View  Result Date: 08/07/2017 CLINICAL DATA:  Hypoxia EXAM: CHEST  2 VIEW COMPARISON:  08/03/2017 FINDINGS: Cardiac shadow is stable. Diffuse interstitial changes are noted bilaterally consistent with edema. No sizable effusion is noted. No bony abnormality is seen. IMPRESSION: Diffuse interstitial infiltrates bilaterally. Electronically Signed   By: Inez Catalina M.D.   On: 08/07/2017 14:45    Cardiac Studies   2d echo pending.  Patient Profile     57 y.o. male with  alcohol abuse (at least 6 pack per day), tobacco abuse and chronic back pain admitted with influenza A and hypoxic respiratory failure, with progression to bilateral pulmonary infiltrates. Cardiology asked to see for possible component of HF, also MAT on telemetry.   Assessment & Plan    1. Acute hypoxic respiratory failure with influenza A and progressive pulmonary infiltrates for which pulmonary suspects BOOP - clinical picture was more c/w pulm disease than CHF. Continue pulm rx as patient has noticed definite improvement overnight with current regimen. Will await echo - as previously stated, given longstanding EtOH and acute illness we would not be surprised if there is some cardiomyopathy present but CHF does not appear to be an active issue at this time. He was treated with IV fluids yesterday and feels better. I did write a care order requesting staff record weights in Epic (daily weights are ordered).  2. Multifocal atrial tachycardia - classically  related to respiratory illness. Treat underlying infection. TSH wnl. If tachycardia persists, Dr. Acie Fredrickson suggested low dose diltiazem could be utilized if LVEF is OK.  3. Orthostasis - suspect this was due to intravascular depletion. Hold off on further diuresis for now. Follow.  4. Abnormal LFTs - improving; question due to acute illness vs alcohol abuse.  5. Minimally  elevated troponin  - nonspecific in setting of the above, low flat trend arguing against ACS. LDL 60. Will await echo.  6. Alcohol abuse/tobacco abuse - IM aware; pt counseled on cessation.  For questions or updates, please contact [place] Please consult [email or web address] for contact info under Cardiology/STEMI.  Signed, Charlie Pitter, PA-C 08/09/2017, 10:18 AM    Attending Note:   The patient was seen and examined.  Agree with assessment and plan as noted above.  Changes made to the above note as needed.  Patient seen and independently examined with Melina Copa, PA .   We discussed all aspects of the encounter. I agree with the assessment and plan as stated above.  1,  Dyspnea.   Most likely related to his influenza. Echo is pending   2.  MAT :  Due to his underlying resp. Illness. Should improve with treatment of his influenza    I have spent a total of 40 minutes with patient reviewing hospital  notes , telemetry, EKGs, labs and examining patient as well as establishing an assessment and plan that was discussed with the patient. > 50% of time was spent in direct patient care.    Thayer Headings, Brooke Bonito., MD, [facility] 08/09/2017, 2:35 PM [address]  [place] Pager [phone]

## 2017-08-09 NOTE — Progress Notes (Addendum)
Name: Tyler [name] MRN: [id number] DOB: [birth year]    ADMISSION DATE:  08/03/2017 CONSULTATION DATE:  1/28  REFERRING MD :  Grandville Silos  CHIEF COMPLAINT: on-going hypoxia   BRIEF PATIENT DESCRIPTION:   57 year old male admitted on 1/23 with influenza A PCR positive and presumed secondary community-acquired pneumonia, treated with appropriate antibiotics as well as Tamiflu however in spite of therapy continues to have diffuse pulmonary infiltrates and hypoxia as of 1/28  SIGNIFICANT EVENTS  1/23 admitted, placed on Tamiflu, Rocephin and azithromycin 1/26 still short of breath, chest x-ray ordered.  1/27 chest x-ray with diffuse pulmonary infiltrates, Lasix given, orthostatic with this 1/28 cardiology asked to evaluate as well as pulmonary  STUDIES:  Echo 1/28:  Microbiology: Blood culture 1/24>>> GI PCR panel 1/24: Negative Differential PCR 1/24: Negative HIV antibody 1/24: Negative Influenza PCR 1/23+ for influenza A Urine strep and Legionella antigen on 1/23: Negative   Antibiotics/antimicrobials: Azithromycin 1/23-1/27 Rocephin 123-1/27 Augmentin 1/27>>> Tamiflu 1/23>>>   SUBJECTIVE:  Feeling better  VITAL SIGNS: Temp:  [97.5 F (36.4 C)-97.8 F (36.6 C)] 97.8 F (36.6 C) (01/29 0537) Pulse Rate:  [65-111] 76 (01/29 0537) Resp:  [18-22] 19 (01/29 0537) BP: (120-126)/(71-85) 120/71 (01/29 0537) SpO2:  [71 %-98 %] 98 % (01/29 0849) Weight:  [140 lb 4.8 oz (63.6 kg)] 140 lb 4.8 oz (63.6 kg) (01/29 1000)  PHYSICAL EXAMINATION: gen 56yom sitting up in bed. WOB improved. No acute distress HENT: NCAT. No JVD mmm Pulm: posterior rales. No accessory use  Card: RRR no MRG Abd: soft not tender + bowel sounds appetite improved Ext/NS: warm, dry no edema  Neuro: a&O no focal def   Recent Labs  Lab 08/07/17 0558 08/08/17 0547 08/09/17 0612  NA 131* 132* 133*  K 3.5 4.0 4.0  CL 95* 95* 98*  CO2 25 28 26   BUN 9 10 12   CREATININE 0.69 0.80 0.56*  GLUCOSE  115* 114* 146*   Recent Labs  Lab 08/07/17 0558 08/08/17 0547 08/09/17 0612  HGB 13.6 13.0 12.1*  HCT 37.7* 37.1* 34.7*  WBC 11.1* 11.2* 9.1  PLT 124* 179 224   Dg Chest 2 View  Result Date: 08/08/2017 CLINICAL DATA:  Hypoxia. Increased weakness and shortness of breath this morning. EXAM: CHEST  2 VIEW COMPARISON:  08/07/2017 and 08/03/2017 FINDINGS: Extensive bilateral upper and lower lobe and middle lobe hazy infiltrates persist, worse on the right than the left. Slight progression in the right midzone. Slight improvement on the left. No effusions. Heart size and pulmonary vascularity are normal. No bone abnormality. IMPRESSION: Persistent extensive bilateral pulmonary infiltrates, slightly progressed on the right and slightly improved on the left. Electronically Signed   By: Lorriane Shire M.D.   On: 08/08/2017 11:43   Dg Chest 2 View  Result Date: 08/07/2017 CLINICAL DATA:  Hypoxia EXAM: CHEST  2 VIEW COMPARISON:  08/03/2017 FINDINGS: Cardiac shadow is stable. Diffuse interstitial changes are noted bilaterally consistent with edema. No sizable effusion is noted. No bony abnormality is seen. IMPRESSION: Diffuse interstitial infiltrates bilaterally. Electronically Signed   By: Inez Catalina M.D.   On: 08/07/2017 14:45    ASSESSMENT / PLAN:  Acute Hypoxic respiratory failure in setting of bilateral pulmonary infiltrates: suspect viral pneumonitis in setting of influenza A vs ALI  vs Organizing PNA following viral PNA.  ->RVP now neg ->sedrate: 41 ->PCT elevated @ 1.63 but not clear what baseline was  ->PCXR personally reviewed: no sig change  Plan Cont steroids Repeat CXR in am  Cont augmentin for now (would like to see PCT down further) Wean oxygen   Erick Colace ACNP-BC [place] Pager # [phone] OR # [phone] if no answer   08/09/2017, 10:50 AM

## 2017-08-09 NOTE — Progress Notes (Signed)
Initial Nutrition Assessment  DOCUMENTATION CODES:   Underweight, Severe malnutrition in context of social or environmental circumstances  INTERVENTION:    Ensure Enlive po TID, each supplement provides 350 kcal and 20 grams of protein  Provide MVI daily  Recommend supplementing thiamine and folic acid  NUTRITION DIAGNOSIS:   Severe Malnutrition related to social / environmental circumstances(alcohol abuse) as evidenced by energy intake < or equal to 75% for > or equal to 1 month, severe fat depletion, severe muscle depletion.  GOAL:   Patient will meet greater than or equal to 90% of their needs  MONITOR:   PO intake, Supplement acceptance, Weight trends, Labs  REASON FOR ASSESSMENT:   Other (Comment)(Low BMI)    ASSESSMENT:   Pt with PMH significant for chronic back pain, tobacco abuse, and alohol abuse (6 pack per day). Presents this admission with complaints of shortness of breath and fever. Admitted for CAP and influenza A. Pt noted to have persistent diarrhea likely viral gastroenteritis.    Spoke with pt at bedside. Pt reports having loss in appetite for over a year, typically consuming one meal per day. His wife cooks meals at home for "a lot of people" and when it's the pt's turn to eat, "there is no food left" per his reports. Over the last four days pt did not consume anything PO due to breathing difficulty. Pt all over the place when providing history, but suspect intake has been poor for a prolonged period. Meal completions charted as 50-100% for each meal. Pt ate 100% of breakfast this morning which contained scrambled eggs, bacon, and grits.   Pt noted to have alcohol abuse history, consuming a 6 pack per day. Recommend supplementing thiamine and folic acid. RD to add MVI.   Pt reports a UBW of 171 lb, the last time being at that weight one year ago. Records are limited in weight history. Nutrition-Focused physical exam completed. Pt states he has trouble  walking and does not walk much at home. Suspect bilateral lower extremity muscle depletion is partly related to immobility.   Medications reviewed and include: solumedrol Labs reviewed: Na 133 (L) Cl 98 (L) AST 46 (H)  NUTRITION - FOCUSED PHYSICAL EXAM:    Most Recent Value  Orbital Region  Mild depletion  Upper Arm Region  Severe depletion  Thoracic and Lumbar Region  Severe depletion  Buccal Region  Mild depletion  Temple Region  Moderate depletion  Clavicle Bone Region  Severe depletion  Clavicle and Acromion Bone Region  Severe depletion  Scapular Bone Region  Severe depletion  Dorsal Hand  Mild depletion  Patellar Region  Severe depletion  Anterior Thigh Region  Severe depletion  Posterior Calf Region  Severe depletion  Edema (RD Assessment)  None  Hair  Reviewed  Eyes  Reviewed  Mouth  Reviewed  Skin  Reviewed  Nails  Reviewed      Diet Order:  Diet regular Room service appropriate? Yes; Fluid consistency: Thin; Fluid restriction: 1200 mL Fluid  EDUCATION NEEDS:   Education needs have been addressed  Skin:  Skin Assessment: Reviewed RN Assessment  Last BM:  08/08/17  Height:   Ht Readings from Last 1 Encounters:  08/09/17 6\' 2"  (1.88 m)    Weight:   Wt Readings from Last 1 Encounters:  08/09/17 140 lb 4.8 oz (63.6 kg)    Ideal Body Weight:  86.4 kg  BMI:  Body mass index is 18.01 kg/m.  Estimated Nutritional Needs:   Kcal:  2200-2400 kcal/day  Protein:  115-125 g/day  Fluid:  >2.2 L/day    Mariana Single RD, LDN Clinical Nutrition Pager # - [phone]

## 2017-08-09 NOTE — Progress Notes (Signed)
Date: August 09, 2017 [name], BSN, [place], [place] Chart and notes review for patient progress and needs. Continues to have sob and crackles desats with exertion. Will follow for case management and discharge needs. No cm or discharge needs present at time of this review. Next review date: [id number]

## 2017-08-09 NOTE — Progress Notes (Signed)
  Echocardiogram 2D Echocardiogram has been performed.  Tyler [name] 08/09/2017, 3:55 PM

## 2017-08-10 ENCOUNTER — Encounter (HOSPITAL_COMMUNITY): Payer: Self-pay | Admitting: Radiology

## 2017-08-10 ENCOUNTER — Inpatient Hospital Stay (HOSPITAL_COMMUNITY): Payer: Self-pay

## 2017-08-10 LAB — BASIC METABOLIC PANEL
ANION GAP: 8 (ref 5–15)
BUN: 17 mg/dL (ref 6–20)
CO2: 27 mmol/L (ref 22–32)
Calcium: 8.3 mg/dL — ABNORMAL LOW (ref 8.9–10.3)
Chloride: 104 mmol/L (ref 101–111)
Creatinine, Ser: 0.62 mg/dL (ref 0.61–1.24)
GFR calc Af Amer: >60 mL/min (ref >60)
GLUCOSE: 125 mg/dL — AB (ref 65–99)
POTASSIUM: 4.1 mmol/L (ref 3.5–5.1)
Sodium: 139 mmol/L (ref 135–145)

## 2017-08-10 LAB — CBC
HCT: 32.7 % — ABNORMAL LOW (ref 39.0–52.0)
Hemoglobin: 11.2 g/dL — ABNORMAL LOW (ref 13.0–17.0)
MCH: 23.6 pg — ABNORMAL LOW (ref 26.0–34.0)
MCHC: 34.3 g/dL (ref 30.0–36.0)
MCV: 68.8 fL — AB (ref 78.0–100.0)
Platelets: 251 10*3/uL (ref 150–400)
RBC: 4.75 MIL/uL (ref 4.22–5.81)
RDW: 14 % (ref 11.5–15.5)
WBC: 16.4 10*3/uL — AB (ref 4.0–10.5)

## 2017-08-10 LAB — PHOSPHORUS: PHOSPHORUS: 2.7 mg/dL (ref 2.5–4.6)

## 2017-08-10 LAB — MAGNESIUM: Magnesium: 2.2 mg/dL (ref 1.7–2.4)

## 2017-08-10 NOTE — Progress Notes (Signed)
Name: Tyler [name] MRN: [id number] DOB: 1960/07/22    ADMISSION DATE:  08/03/2017 CONSULTATION DATE:  1/28  REFERRING MD :  Grandville Silos  CHIEF COMPLAINT: on-going hypoxia   BRIEF PATIENT DESCRIPTION:   57 year old male admitted on 1/23 with influenza A PCR positive and presumed secondary community-acquired pneumonia, treated with appropriate antibiotics as well as Tamiflu however in spite of therapy continues to have diffuse pulmonary infiltrates and hypoxia as of 1/28  SIGNIFICANT EVENTS  1/23 admitted, placed on Tamiflu, Rocephin and azithromycin 1/26 still short of breath, chest x-ray ordered.  1/27 chest x-ray with diffuse pulmonary infiltrates, Lasix given, orthostatic with this 1/28 cardiology asked to evaluate as well as pulmonary  STUDIES:  Echo 1/28:  Microbiology: Blood culture 1/24>>> GI PCR panel 1/24: Negative Differential PCR 1/24: Negative HIV antibody 1/24: Negative Influenza PCR 1/23+ for influenza A Urine strep and Legionella antigen on 1/23: Negative   Antibiotics/antimicrobials: Results for orders placed or performed during the hospital encounter of 08/03/17  Culture, blood (routine x 2) Call MD if unable to obtain prior to antibiotics being given     Status: None   Collection Time: 08/04/17  1:51 AM  Result Value Ref Range Status   Specimen Description BLOOD LEFT ANTECUBITAL  Final   Special Requests   Final    BOTTLES DRAWN AEROBIC AND ANAEROBIC Blood Culture adequate volume   Culture   Final    NO GROWTH 5 DAYS Performed at [facility], [place] [address], [place], [place] [address]    Report Status 08/09/2017 FINAL  Final  Culture, blood (routine x 2) Call MD if unable to obtain prior to antibiotics being given     Status: None   Collection Time: 08/04/17  2:05 AM  Result Value Ref Range Status   Specimen Description BLOOD LEFT HAND  Final   Special Requests IN PEDIATRIC BOTTLE Blood Culture adequate volume  Final   Culture   Final    NO  GROWTH 5 DAYS Performed at [place] [address], [place], [place] [address]    Report Status 08/09/2017 FINAL  Final  C difficile quick scan w PCR reflex     Status: None   Collection Time: 08/04/17  6:55 PM  Result Value Ref Range Status   C Diff antigen NEGATIVE NEGATIVE Final   C Diff toxin NEGATIVE NEGATIVE Final   C Diff interpretation No C. difficile detected.  Final  Gastrointestinal Panel by PCR , Stool     Status: None   Collection Time: 08/04/17  6:55 PM  Result Value Ref Range Status   Campylobacter species NOT DETECTED NOT DETECTED Final   Plesimonas shigelloides NOT DETECTED NOT DETECTED Final   Salmonella species NOT DETECTED NOT DETECTED Final   Yersinia enterocolitica NOT DETECTED NOT DETECTED Final   Vibrio species NOT DETECTED NOT DETECTED Final   Vibrio cholerae NOT DETECTED NOT DETECTED Final   Enteroaggregative E coli (EAEC) NOT DETECTED NOT DETECTED Final   Enteropathogenic E coli (EPEC) NOT DETECTED NOT DETECTED Final   Enterotoxigenic E coli (ETEC) NOT DETECTED NOT DETECTED Final   Shiga like toxin producing E coli (STEC) NOT DETECTED NOT DETECTED Final   Shigella/Enteroinvasive E coli (EIEC) NOT DETECTED NOT DETECTED Final   Cryptosporidium NOT DETECTED NOT DETECTED Final   Cyclospora cayetanensis NOT DETECTED NOT DETECTED Final   Entamoeba histolytica NOT DETECTED NOT DETECTED Final   Giardia lamblia NOT DETECTED NOT DETECTED Final   Adenovirus F40/41 NOT  DETECTED NOT DETECTED Final   Astrovirus NOT DETECTED NOT DETECTED Final   Norovirus GI/GII NOT DETECTED NOT DETECTED Final   Rotavirus A NOT DETECTED NOT DETECTED Final   Sapovirus (I, II, IV, and V) NOT DETECTED NOT DETECTED Final    Comment: Performed at [facility], [address] [place], [place] [address]  Culture, sputum-assessment     Status: None   Collection Time: 08/04/17 11:45 PM  Result Value Ref Range Status   Specimen Description SPUTUM  Final   Special Requests  Normal  Final   Sputum evaluation   Final    Sputum specimen not acceptable for testing.  Please recollect.   NOTIFIED DEBBIE HILL RN 1.25.19 @0211  ZANDO,C    Report Status 08/05/2017 FINAL  Final  Respiratory Panel by PCR     Status: None   Collection Time: 08/08/17  3:42 PM  Result Value Ref Range Status   Adenovirus NOT DETECTED NOT DETECTED Final   Coronavirus 229E NOT DETECTED NOT DETECTED Final   Coronavirus HKU1 NOT DETECTED NOT DETECTED Final   Coronavirus NL63 NOT DETECTED NOT DETECTED Final   Coronavirus OC43 NOT DETECTED NOT DETECTED Final   Metapneumovirus NOT DETECTED NOT DETECTED Final   Rhinovirus / Enterovirus NOT DETECTED NOT DETECTED Final   Influenza A NOT DETECTED NOT DETECTED Final   Influenza B NOT DETECTED NOT DETECTED Final   Parainfluenza Virus 1 NOT DETECTED NOT DETECTED Final   Parainfluenza Virus 2 NOT DETECTED NOT DETECTED Final   Parainfluenza Virus 3 NOT DETECTED NOT DETECTED Final   Parainfluenza Virus 4 NOT DETECTED NOT DETECTED Final   Respiratory Syncytial Virus NOT DETECTED NOT DETECTED Final   Bordetella pertussis NOT DETECTED NOT DETECTED Final   Chlamydophila pneumoniae NOT DETECTED NOT DETECTED Final   Mycoplasma pneumoniae NOT DETECTED NOT DETECTED Final    Comment: Performed at [facility], [place]. [address], [place], [place] [address]      SUBJECTIVE:  08/10/2017 - still needing o2. Desturtes when walks. CXR without changed   VITAL SIGNS: Temp:  [97.8 F (36.6 C)] 97.8 F (36.6 C) (01/30 0503) Pulse Rate:  [79-98] 79 (01/30 0503) Resp:  [18-24] 24 (01/30 0503) BP: (126-134)/(93-96) 126/96 (01/30 0503) SpO2:  [89 %-100 %] 95 % (01/30 0910)  PHYSICAL EXAMINATION:  General Appearance:    Looks well  Head:    Normocephalic, without obvious abnormality, atraumatic  Eyes:    PERRL - yes, conjunctiva/corneas - clear      Ears:    Normal external ear canals, both ears  Nose:   NG tube - no but has [place]  Throat:  ETT TUBE - no , OG  tube - no  Neck:   Supple,  No enlargement/tenderness/nodules     Lungs:     No distress. Some scattered crackles oly  Chest wall:    No deformity  Heart:    S1 and S2 normal, no murmur, CVP - no.  Pressors - no  Abdomen:     Soft, no masses, no organomegaly  Genitalia:    Not done  Rectal:   not done  Extremities:   Extremities- intact     Skin:   Intact in exposed areas . Sacral area - no decub     Neurologic:   Sedation - none -> RASS - +1 . Moves all 4s - yes. CAM-ICU - neg . Orientation - x 3+    PULMONARY Recent Labs  Lab 08/08/17 1056  PHART  7.514*  PCO2ART 33.5  PO2ART 66.4*  HCO3 26.5  O2SAT 91.8    CBC Recent Labs  Lab 08/08/17 0547 08/09/17 0612 08/10/17 0748  HGB 13.0 12.1* 11.2*  HCT 37.1* 34.7* 32.7*  WBC 11.2* 9.1 16.4*  PLT 179 224 251    COAGULATION No results for input(s): INR in the last 168 hours.  CARDIAC   Recent Labs  Lab 08/04/17 0151 08/04/17 0832 08/04/17 1311 08/09/17 0612  TROPONINI <0.03 0.03* 0.03* <0.03   No results for input(s): PROBNP in the last 168 hours.   CHEMISTRY Recent Labs  Lab 08/04/17 0626 08/05/17 0547 08/06/17 9485 08/06/17 1448 08/07/17 0558 08/08/17 0547 08/09/17 0612 08/10/17 0748  NA 131* 128* 124* 128* 131* 132* 133* 139  K 3.4* 3.0* 3.5 3.7 3.5 4.0 4.0 4.1  CL 97* 95* 94* 93* 95* 95* 98* 104  CO2 24 23 23 23 25 28 26 27   GLUCOSE 102* 107* 115* 131* 115* 114* 146* 125*  BUN 15 8 6 7 9 10 12 17   CREATININE 0.72 0.62 0.67 0.80 0.69 0.80 0.56* 0.62  CALCIUM 8.1* 7.6* 7.3* 7.9* 7.9* 8.1* 7.8* 8.3*  MG 1.7 1.5* 1.8  --   --   --  2.0 2.2  PHOS  --   --   --   --   --   --  3.3 2.7   Estimated Creatinine Clearance: 90.5 mL/min (by C-G formula based on SCr of 0.62 mg/dL).   LIVER Recent Labs  Lab 08/03/17 1815 08/04/17 0832 08/09/17 0612  AST 183* 117* 46*  ALT 66* 46 30  ALKPHOS 69 49 44  BILITOT 0.7 0.7 1.0  PROT 8.8* 7.0 6.6  ALBUMIN 4.0 2.9* 2.4*     INFECTIOUS Recent Labs    Lab 08/03/17 2232 08/04/17 0033 08/04/17 0507 08/08/17 1420  LATICACIDVEN 2.4* 3.1* 1.3  --   PROCALCITON  --   --   --  1.63     ENDOCRINE CBG (last 3)  No results for input(s): GLUCAP in the last 72 hours.       IMAGING x48h  - image(s) personally visualized  -   highlighted in bold Dg Chest 2 View  Result Date: 08/09/2017 CLINICAL DATA:  Shortness of breath. EXAM: CHEST  2 VIEW COMPARISON:  08/08/2017.  08/07/2017.  08/03/2017. FINDINGS: Heart size normal. Diffuse bilateral pulmonary interstitial prominence again noted without interim change. Small bilateral pleural effusions noted. No pneumothorax. Heart size stable. No acute bony abnormality. IMPRESSION: Persistent bilateral pulmonary interstitial without significant change from prior exam. Small bilateral pleural effusions. Electronically Signed   By: Marcello Moores  Register   On: 08/09/2017 10:56   Dg Chest 2 View  Result Date: 08/08/2017 CLINICAL DATA:  Hypoxia. Increased weakness and shortness of breath this morning. EXAM: CHEST  2 VIEW COMPARISON:  08/07/2017 and 08/03/2017 FINDINGS: Extensive bilateral upper and lower lobe and middle lobe hazy infiltrates persist, worse on the right than the left. Slight progression in the right midzone. Slight improvement on the left. No effusions. Heart size and pulmonary vascularity are normal. No bone abnormality. IMPRESSION: Persistent extensive bilateral pulmonary infiltrates, slightly progressed on the right and slightly improved on the left. Electronically Signed   By: Lorriane Shire M.D.   On: 08/08/2017 11:43     ASSESSMENT / PLAN:  Acute Hypoxic respiratory failure in setting of bilateral pulmonary infiltrates: suspect viral pneumonitis in setting of influenza A vs ALI  vs Organizing PNA following viral PNA.  HIV negative  08/10/2017 - acute resp failure - d3 steroids. Not better. Not worse. Echo normal   Plan Check HRCT Check autommune and vasculitis panel 08/11/17 IV  steroids through 08/11/17 and then decide on po steroids based on CT Resutls Continue o2 for pulse ox > 88%  D.w Dr Lovena Neighbours of triad    Dr. Brand Males, M.D., [facility].C.P Pulmonary and Critical Care Medicine Staff Physician, [place] Director - Interstitial Lung Disease  Program  Pulmonary [place] [facility] [place], [place], [address]  Pager: [phone], If no answer or between  15:00h - 7:00h: call [phone] Telephone: [phone]

## 2017-08-10 NOTE — Progress Notes (Signed)
Pt walked from room to opposite end of hall back to room. O2 sats on RA after ambulating is 79-82%. Pt states he had no SOB or dizziness while ambulating. O2 sats quickly rose to 96% back on 4L.

## 2017-08-10 NOTE — Progress Notes (Signed)
PROGRESS NOTE        PATIENT DETAILS Name: Tyler [name] Age: 57 y.o. Sex: male Date of Birth: 1961-05-09 Admit Date: 08/03/2017 Admitting Physician Jani Gravel, MD IRJ:[id number], No Pcp Per  Brief Narrative: Patient is a 57 y.o. male who was initially admitted for influenza/committee acquired pneumonia and treated with antibiotics and Tamiflu, hospital course complicated by worsening hypoxia-with x-ray demonstrating bilateral pulmonary infiltrates-evaluated by pulmonology with suspect for viral pneumonitis vs acute lung injury vs organizing pneumonia.  Continues to be hypoxic and requiring oxygen-see below for further details  Subjective: Ambulated in the hallway with oxygen-did not get short of breath  Assessment/Plan: Acute hypoxic respiratory failure: Etiology uncertain-but given bilateral pulmonary infiltrates on chest x-ray-thought to have either viral pneumonitis vs ALI vs organizing pneumonia.  Has completed a course of  Tamiflu.  Remains on steroids and other supportive care.  Pulmonology recommending a HRCT and a autoimmune panel.  Pulmonology to provide further recommendations once CT scan obtained.  Probably will require home oxygen on discharge.  Influenza A/community acquired pneumonia: Completed a course of Tamiflu.  He remains on Augmentin.  He remains stable-he is afebrile-does have some mild leukocytosis secondary to steroids.  Unfortunately appears to have developed worsening hypoxia due to these bilateral infiltrates as noted above even after he has completed a course of antimicrobial treatment.  Multifocal atrial tachycardia: Telemetry monitoring-2D echocardiogram shows preserved EF.  DVT Prophylaxis: Prophylactic Lovenox   Code Status: Full code   Family Communication: None at bedside  Disposition Plan: Remain inpatient-probably home in the next day or so  Antimicrobial agents: Anti-infectives (From admission, onward)   Start      Dose/Rate Route Frequency Ordered Stop   08/07/17 1000  amoxicillin-clavulanate (AUGMENTIN) 875-125 MG per tablet 1 tablet     1 tablet Oral Every 12 hours 08/07/17 0807     08/04/17 2300  azithromycin (ZITHROMAX) 500 mg in dextrose 5 % 250 mL IVPB  Status:  Discontinued     500 mg 250 mL/hr over 60 Minutes Intravenous Every 24 hours 08/04/17 0135 08/07/17 0807   08/04/17 2200  cefTRIAXone (ROCEPHIN) 1 g in dextrose 5 % 50 mL IVPB  Status:  Discontinued     1 g 100 mL/hr over 30 Minutes Intravenous Every 24 hours 08/04/17 0135 08/07/17 0807   08/04/17 0115  oseltamivir (TAMIFLU) capsule 75 mg     75 mg Oral 2 times daily 08/04/17 0110 08/08/17 0933   08/03/17 2330  cefTRIAXone (ROCEPHIN) 2 g in dextrose 5 % 50 mL IVPB     2 g 100 mL/hr over 30 Minutes Intravenous  Once 08/03/17 2321 08/04/17 0039   08/03/17 2330  azithromycin (ZITHROMAX) 500 mg in dextrose 5 % 250 mL IVPB     500 mg 250 mL/hr over 60 Minutes Intravenous  Once 08/03/17 2321 08/04/17 0147      Procedures: None  CONSULTS:  pulmonary/intensive care  Time spent: 25- minutes-Greater than 50% of this time was spent in counseling, explanation of diagnosis, planning of further management, and coordination of care.  MEDICATIONS: Scheduled Meds: . amoxicillin-clavulanate  1 tablet Oral Q12H  . budesonide (PULMICORT) nebulizer solution  0.5 mg Nebulization BID  . enoxaparin (LOVENOX) injection  40 mg Subcutaneous Q24H  . feeding supplement (ENSURE ENLIVE)  237 mL Oral TID BM  . fluticasone  2 spray Each Nare  Daily  . guaiFENesin  1,200 mg Oral BID  . ipratropium  0.5 mg Nebulization TID  . levalbuterol  0.63 mg Nebulization TID  . loratadine  10 mg Oral Daily  . methylPREDNISolone (SOLU-MEDROL) injection  60 mg Intravenous Q6H  . multivitamin with minerals  1 tablet Oral Daily  . pantoprazole  40 mg Oral Q0600   Continuous Infusions: PRN Meds:.acetaminophen, benzonatate, [DISCONTINUED] ipratropium **AND**  levalbuterol, loperamide, ondansetron (ZOFRAN) IV   PHYSICAL EXAM: Vital signs: Vitals:   08/09/17 2111 08/09/17 2135 08/10/17 0503 08/10/17 0910  BP:  (!) 134/93 (!) 126/96   Pulse:  98 79   Resp:  18 (!) 24   Temp:   97.8 F (36.6 C)   TempSrc:   Oral   SpO2: (!) 89% 100% 95% 95%  Weight:      Height:       Filed Weights   08/06/17 0907 08/07/17 0500 08/09/17 1000  Weight: 65.1 kg (143 lb 8.3 oz) 62.7 kg (138 lb 3.2 oz) 63.6 kg (140 lb 4.8 oz)   Body mass index is 18.01 kg/m.   General appearance :Awake, alert, not in any distress. Speech Clear. Not toxic Looking Eyes:, pupils equally reactive to light and accomodation,no scleral icterus.Pink conjunctiva HEENT: Atraumatic and Normocephalic Neck: supple, no JVD. No cervical lymphadenopathy. No thyromegaly Resp:Good air entry bilaterally, bibasilar rales CVS: S1 S2 regular, no murmurs.  GI: Bowel sounds present, Non tender and not distended with no gaurding, rigidity or rebound.No organomegaly Extremities: B/L Lower Ext shows no edema, both legs are warm to touch Neurology:  speech clear,Non focal, sensation is grossly intact. Psychiatric: Normal judgment and insight. Alert and oriented x 3. Normal mood. Musculoskeletal:No digital cyanosis Skin:No Rash, warm and dry Wounds:N/A  I have personally reviewed following labs and imaging studies  LABORATORY DATA: CBC: Recent Labs  Lab 08/04/17 0832  08/06/17 0619 08/07/17 0558 08/08/17 0547 08/09/17 0612 08/10/17 0748  WBC 8.9   < > 9.5 11.1* 11.2* 9.1 16.4*  NEUTROABS 6.8  --   --   --  7.7  --   --   HGB 12.1*   < > 12.8* 13.6 13.0 12.1* 11.2*  HCT 34.7*   < > 36.5* 37.7* 37.1* 34.7* 32.7*  MCV 69.4*   < > 68.1* 67.2* 68.3* 68.0* 68.8*  PLT 137*   < > 124* 124* 179 224 251   < > = values in this interval not displayed.    Basic Metabolic Panel: Recent Labs  Lab 08/04/17 0832 08/05/17 0547 08/06/17 1829 08/06/17 1448 08/07/17 0558 08/08/17 0547  08/09/17 0612 08/10/17 0748  NA 131* 128* 124* 128* 131* 132* 133* 139  K 3.4* 3.0* 3.5 3.7 3.5 4.0 4.0 4.1  CL 97* 95* 94* 93* 95* 95* 98* 104  CO2 24 23 23 23 25 28 26 27   GLUCOSE 102* 107* 115* 131* 115* 114* 146* 125*  BUN 15 8 6 7 9 10 12 17   CREATININE 0.72 0.62 0.67 0.80 0.69 0.80 0.56* 0.62  CALCIUM 8.1* 7.6* 7.3* 7.9* 7.9* 8.1* 7.8* 8.3*  MG 1.7 1.5* 1.8  --   --   --  2.0 2.2  PHOS  --   --   --   --   --   --  3.3 2.7    GFR: Estimated Creatinine Clearance: 90.5 mL/min (by C-G formula based on SCr of 0.62 mg/dL).  Liver Function Tests: Recent Labs  Lab 08/03/17 1815 08/04/17 0832 08/09/17 0612  AST  183* 117* 46*  ALT 66* 46 30  ALKPHOS 69 49 44  BILITOT 0.7 0.7 1.0  PROT 8.8* 7.0 6.6  ALBUMIN 4.0 2.9* 2.4*   Recent Labs  Lab 08/03/17 1815 08/04/17 0832  LIPASE 62* 72*   No results for input(s): AMMONIA in the last 168 hours.  Coagulation Profile: No results for input(s): INR, PROTIME in the last 168 hours.  Cardiac Enzymes: Recent Labs  Lab 08/04/17 0151 08/04/17 0832 08/04/17 1311 08/09/17 0612  TROPONINI <0.03 0.03* 0.03* <0.03    BNP (last 3 results) No results for input(s): PROBNP in the last 8760 hours.  HbA1C: No results for input(s): HGBA1C in the last 72 hours.  CBG: No results for input(s): GLUCAP in the last 168 hours.  Lipid Profile: Recent Labs    08/09/17 0612  CHOL 104  HDL 24*  LDLCALC 60  TRIG 100  CHOLHDL 4.3    Thyroid Function Tests: Recent Labs    08/08/17 1222  TSH 1.515    Anemia Panel: No results for input(s): VITAMINB12, FOLATE, FERRITIN, TIBC, IRON, RETICCTPCT in the last 72 hours.  Urine analysis:    Component Value Date/Time   COLORURINE YELLOW 08/03/2017 [place] 08/03/2017 1755   LABSPEC 1.019 08/03/2017 1755   PHURINE 5.0 08/03/2017 1755   GLUCOSEU NEGATIVE 08/03/2017 1755   HGBUR MODERATE (A) 08/03/2017 1755   BILIRUBINUR NEGATIVE 08/03/2017 1755   KETONESUR NEGATIVE  08/03/2017 1755   PROTEINUR 100 (A) 08/03/2017 1755   NITRITE NEGATIVE 08/03/2017 1755   LEUKOCYTESUR NEGATIVE 08/03/2017 1755    Sepsis Labs: Lactic Acid, Venous    Component Value Date/Time   LATICACIDVEN 1.3 08/04/2017 0507    MICROBIOLOGY: Recent Results (from the past 240 hour(s))  Culture, blood (routine x 2) Call MD if unable to obtain prior to antibiotics being given     Status: None   Collection Time: 08/04/17  1:51 AM  Result Value Ref Range Status   Specimen Description BLOOD LEFT ANTECUBITAL  Final   Special Requests   Final    BOTTLES DRAWN AEROBIC AND ANAEROBIC Blood Culture adequate volume   Culture   Final    NO GROWTH 5 DAYS Performed at [facility], [place] [address], [place], [place] [address]    Report Status 08/09/2017 FINAL  Final  Culture, blood (routine x 2) Call MD if unable to obtain prior to antibiotics being given     Status: None   Collection Time: 08/04/17  2:05 AM  Result Value Ref Range Status   Specimen Description BLOOD LEFT HAND  Final   Special Requests IN PEDIATRIC BOTTLE Blood Culture adequate volume  Final   Culture   Final    NO GROWTH 5 DAYS Performed at [place] [address], [place], [place] [address]    Report Status 08/09/2017 FINAL  Final  C difficile quick scan w PCR reflex     Status: None   Collection Time: 08/04/17  6:55 PM  Result Value Ref Range Status   C Diff antigen NEGATIVE NEGATIVE Final   C Diff toxin NEGATIVE NEGATIVE Final   C Diff interpretation No C. difficile detected.  Final  Gastrointestinal Panel by PCR , Stool     Status: None   Collection Time: 08/04/17  6:55 PM  Result Value Ref Range Status   Campylobacter species NOT DETECTED NOT DETECTED Final   Plesimonas shigelloides NOT DETECTED NOT DETECTED Final   Salmonella species NOT DETECTED  NOT DETECTED Final   Yersinia enterocolitica NOT DETECTED NOT DETECTED Final   Vibrio species NOT DETECTED NOT DETECTED Final   Vibrio cholerae NOT  DETECTED NOT DETECTED Final   Enteroaggregative E coli (EAEC) NOT DETECTED NOT DETECTED Final   Enteropathogenic E coli (EPEC) NOT DETECTED NOT DETECTED Final   Enterotoxigenic E coli (ETEC) NOT DETECTED NOT DETECTED Final   Shiga like toxin producing E coli (STEC) NOT DETECTED NOT DETECTED Final   Shigella/Enteroinvasive E coli (EIEC) NOT DETECTED NOT DETECTED Final   Cryptosporidium NOT DETECTED NOT DETECTED Final   Cyclospora cayetanensis NOT DETECTED NOT DETECTED Final   Entamoeba histolytica NOT DETECTED NOT DETECTED Final   Giardia lamblia NOT DETECTED NOT DETECTED Final   Adenovirus F40/41 NOT DETECTED NOT DETECTED Final   Astrovirus NOT DETECTED NOT DETECTED Final   Norovirus GI/GII NOT DETECTED NOT DETECTED Final   Rotavirus A NOT DETECTED NOT DETECTED Final   Sapovirus (I, II, IV, and V) NOT DETECTED NOT DETECTED Final    Comment: Performed at [facility], [address] [place], [place] [address]  Culture, sputum-assessment     Status: None   Collection Time: 08/04/17 11:45 PM  Result Value Ref Range Status   Specimen Description SPUTUM  Final   Special Requests Normal  Final   Sputum evaluation   Final    Sputum specimen not acceptable for testing.  Please recollect.   NOTIFIED DEBBIE HILL RN 1.25.19 @0211  ZANDO,C    Report Status 08/05/2017 FINAL  Final  Respiratory Panel by PCR     Status: None   Collection Time: 08/08/17  3:42 PM  Result Value Ref Range Status   Adenovirus NOT DETECTED NOT DETECTED Final   Coronavirus 229E NOT DETECTED NOT DETECTED Final   Coronavirus HKU1 NOT DETECTED NOT DETECTED Final   Coronavirus NL63 NOT DETECTED NOT DETECTED Final   Coronavirus OC43 NOT DETECTED NOT DETECTED Final   Metapneumovirus NOT DETECTED NOT DETECTED Final   Rhinovirus / Enterovirus NOT DETECTED NOT DETECTED Final   Influenza A NOT DETECTED NOT DETECTED Final   Influenza B NOT DETECTED NOT DETECTED Final   Parainfluenza Virus 1 NOT DETECTED NOT DETECTED  Final   Parainfluenza Virus 2 NOT DETECTED NOT DETECTED Final   Parainfluenza Virus 3 NOT DETECTED NOT DETECTED Final   Parainfluenza Virus 4 NOT DETECTED NOT DETECTED Final   Respiratory Syncytial Virus NOT DETECTED NOT DETECTED Final   Bordetella pertussis NOT DETECTED NOT DETECTED Final   Chlamydophila pneumoniae NOT DETECTED NOT DETECTED Final   Mycoplasma pneumoniae NOT DETECTED NOT DETECTED Final    Comment: Performed at [facility], [place]. [address], [place], [place] [address]    RADIOLOGY STUDIES/RESULTS: Dg Chest 2 View  Result Date: 08/09/2017 CLINICAL DATA:  Shortness of breath. EXAM: CHEST  2 VIEW COMPARISON:  08/08/2017.  08/07/2017.  08/03/2017. FINDINGS: Heart size normal. Diffuse bilateral pulmonary interstitial prominence again noted without interim change. Small bilateral pleural effusions noted. No pneumothorax. Heart size stable. No acute bony abnormality. IMPRESSION: Persistent bilateral pulmonary interstitial without significant change from prior exam. Small bilateral pleural effusions. Electronically Signed   By: Marcello Moores  Register   On: 08/09/2017 10:56   Dg Chest 2 View  Result Date: 08/08/2017 CLINICAL DATA:  Hypoxia. Increased weakness and shortness of breath this morning. EXAM: CHEST  2 VIEW COMPARISON:  08/07/2017 and 08/03/2017 FINDINGS: Extensive bilateral upper and lower lobe and middle lobe hazy infiltrates persist, worse on the right than the left.  Slight progression in the right midzone. Slight improvement on the left. No effusions. Heart size and pulmonary vascularity are normal. No bone abnormality. IMPRESSION: Persistent extensive bilateral pulmonary infiltrates, slightly progressed on the right and slightly improved on the left. Electronically Signed   By: Lorriane Shire M.D.   On: 08/08/2017 11:43   Dg Chest 2 View  Result Date: 08/07/2017 CLINICAL DATA:  Hypoxia EXAM: CHEST  2 VIEW COMPARISON:  08/03/2017 FINDINGS: Cardiac shadow is stable. Diffuse  interstitial changes are noted bilaterally consistent with edema. No sizable effusion is noted. No bony abnormality is seen. IMPRESSION: Diffuse interstitial infiltrates bilaterally. Electronically Signed   By: Inez Catalina M.D.   On: 08/07/2017 14:45   Dg Chest 2 View  Result Date: 08/03/2017 CLINICAL DATA:  Flu like symptoms EXAM: CHEST  2 VIEW COMPARISON:  None. FINDINGS: Streaky left lower lobe infiltrate. No pleural effusion. Borderline cardiomegaly. No pneumothorax. IMPRESSION: Streaky left lower lobe infiltrate Electronically Signed   By: Donavan Foil M.D.   On: 08/03/2017 22:59     LOS: 6 days   Oren Binet, MD  [facility] Pager:[phone]  If 7PM-7AM, please contact night-coverage [email or web address] Password [email or web address] 08/10/2017, 1:15 PM

## 2017-08-10 NOTE — Progress Notes (Signed)
    Echo shows normal LV function His dyspnea appears to be due to influenza Will sign off  Follow up with his primary MD    [name], MD  08/10/2017 7:03 [address],  [place], [place]  [id number] Pager [phone] Phone: [phone]; Fax: [phone]

## 2017-08-11 ENCOUNTER — Telehealth: Payer: Self-pay | Admitting: Adult Health

## 2017-08-11 DIAGNOSIS — J189 Pneumonia, unspecified organism: Secondary | ICD-10-CM

## 2017-08-11 DIAGNOSIS — J8489 Other specified interstitial pulmonary diseases: Secondary | ICD-10-CM

## 2017-08-11 LAB — SEDIMENTATION RATE: Sed Rate: 21 mm/hr — ABNORMAL HIGH (ref 0–16)

## 2017-08-11 LAB — MAGNESIUM: Magnesium: 2.1 mg/dL (ref 1.7–2.4)

## 2017-08-11 LAB — PHOSPHORUS: Phosphorus: 3.3 mg/dL (ref 2.5–4.6)

## 2017-08-11 LAB — PROCALCITONIN: PROCALCITONIN: 0.39 ng/mL

## 2017-08-11 MED ORDER — PREDNISONE 10 MG PO TABS: ORAL_TABLET | ORAL | 0 refills | Status: DC

## 2017-08-11 MED ORDER — TIOTROPIUM BROMIDE MONOHYDRATE 18 MCG IN CAPS: 18.0000 ug | ORAL_CAPSULE | Freq: Every day | RESPIRATORY_TRACT | 0 refills | Status: DC

## 2017-08-11 MED ORDER — ALBUTEROL SULFATE HFA 108 (90 BASE) MCG/ACT IN AERS: 2.0000 | INHALATION_SPRAY | Freq: Four times a day (QID) | RESPIRATORY_TRACT | 0 refills | Status: DC | PRN

## 2017-08-11 MED ORDER — ENSURE ENLIVE PO LIQD: 237.0000 mL | Freq: Three times a day (TID) | ORAL | 0 refills | Status: AC

## 2017-08-11 NOTE — Telephone Encounter (Signed)
Noted, thank you Will sign and keep in my inbasket to ensure pt receives his cxr at time of office visit

## 2017-08-11 NOTE — Discharge Summary (Addendum)
PATIENT DETAILS Name: Tyler [name] Age: 57 y.o. Sex: male Date of Birth: 1961/05/24 MRN: [id number]. Admitting Physician: Jani Gravel, MD [id number], No Pcp Per  Admit Date: 08/03/2017 Discharge date: 08/11/2017  Recommendations for Outpatient Follow-up:  1. Follow up with PCP in 1-2 weeks 2. Please obtain BMP/CBC in one week 3. Outpatient PFTs 4. Has a right upper lobe pulmonary nodule-follow-up CT chest is recommended in 3-6 months given his history of smoking 5. Please ensure follow-up with Dr. Chase Caller 6. Please follow up on the following pending results: Autoimmune panel  Admitted From:  Home  Disposition:  [place]: No  Equipment/Devices: oxygen 2L   Discharge Condition: Stable  CODE STATUS: FULL CODE  Diet recommendation:   Regular  Brief Summary: See H&P, Labs, Consult and Test reports for all details in brief,Patient is a 57 y.o. male who was initially admitted for influenza/committee acquired pneumonia and treated with antibiotics and Tamiflu, hospital course complicated by worsening hypoxia-with x-ray demonstrating bilateral pulmonary infiltrates-evaluated by pulmonology with suspect for viral pneumonitis vs acute lung injury vs organizing pneumonia.  Continues to be hypoxic and requiring oxygen-see below for further details  Brief Hospital Course: Acute hypoxic respiratory failure: Etiology uncertain-but given bilateral pulmonary infiltrates on chest x-ray-thought to have either viral pneumonitis vs ALI vs organizing pneumonia.  Has completed a course of  Tamiflu.   Evaluated by pulmonology-underwent a HRCT which showed significant infiltrates-spoke with Dr. Chase Caller over the phone today-he recommends oxygen on discharge, along with a 3-week tapering steroids.  He also recommends that the patient be discharged on Spiriva and as needed albuterol.  His office will call the patient for a follow-up appointment.  Patient will be discharged on home O2.   Pulmonology ordered a autoimmune panel-which is pending as of this morning-and this will need to be followed up at his next follow-up appointment with pulmonology.  Influenza A/community acquired pneumonia: Completed a course of Tamiflu.  He also has completed a course of Augmentin/antimicrobial therapy. He remains stable-he is afebrile-does have some mild leukocytosis secondary to steroids.  Unfortunately appears to have developed worsening hypoxia due to these bilateral infiltrates as noted above even after he has completed a course of antimicrobial treatment.  Probable COPD: Significant emphysematous changes seen on CT chest-recommendations from pulmonology are to discharge patient on Spiriva and as needed albuterol.  Will need outpatient PFTs.  Multifocal atrial tachycardia: Resolved-monitor on telemetry.  Followed by cardiology as well.  2D echocardiogram shows preserved EF.  Lung nodule: Seen on CT chest-recommendations are to repeat another CT chest in the next 3-6 months given his history of smoking.  Will defer this to the patient's outpatient physicians.  Please note-this finding recommendation was discussed with the patient.  Procedures/Studies: Echo 1/29>> - Left ventricle: The cavity size was normal. Systolic function was   normal. The estimated ejection fraction was in the range of 60%   to 65%. Wall motion was normal; there were no regional wall   motion abnormalities. Doppler parameters are consistent with   abnormal left ventricular relaxation (grade 1 diastolic   dysfunction). Doppler parameters are consistent with   indeterminate ventricular filling pressure. - Aortic valve: Transvalvular velocity was within the normal range.   There was no stenosis. There was no regurgitation. - Mitral valve: Transvalvular velocity was within the normal range.   There was no evidence for stenosis. There was no regurgitation. - Right ventricle: The cavity size was normal. Wall thickness  was  normal. Systolic function was normal. - Tricuspid valve: There was mild regurgitation. - Pulmonary arteries: Systolic pressure was within the normal   range. PA peak pressure: 26 mm Hg (S).  Discharge Diagnoses:  Principal Problem:   Acute respiratory failure with hypoxia ([place]) Active Problems:   CAP (community acquired pneumonia)   Hyponatremia   Tachycardia   Influenza A   Dehydration   Diarrhea   Hypoxia   Protein-calorie malnutrition, severe   Acute lung injury   Pneumonitis   BOOP (bronchiolitis obliterans with organizing pneumonia) [facility])   Discharge Instructions:  Activity:  As tolerated   Discharge Instructions    Diet - low sodium heart healthy   Complete by:  As directed    Discharge instructions   Complete by:  As directed    Follow with Primary MD and Dr. Chase Caller (lung MD) in the next 1-2 weeks.  You have a right lung nodule-given your history of smoking-it is recommended that you undergo a CT scan of the chest in 3-6 months.  In some cases these lung nodules can turn into cancer.  Please make sure your primary care MD and you pulmonologist is aware of this finding.  Please get a complete blood count and chemistry panel checked by your Primary MD at your next visit, and again as instructed by your Primary MD.  Get Medicines reviewed and adjusted: Please take all your medications with you for your next visit with your Primary MD  Laboratory/radiological data: Please request your Primary MD to go over all hospital tests and procedure/radiological results at the follow up, please ask your Primary MD to get all Hospital records sent to his/her office.  In some cases, they will be blood work, cultures and biopsy results pending at the time of your discharge. Please request that your primary care M.D. follows up on these results.  Also Note the following: If you experience worsening of your admission symptoms, develop shortness of breath, life threatening  emergency, suicidal or homicidal thoughts you must seek medical attention immediately by calling 911 or calling your MD immediately  if symptoms less severe.  You must read complete instructions/literature along with all the possible adverse reactions/side effects for all the Medicines you take and that have been prescribed to you. Take any new Medicines after you have completely understood and accpet all the possible adverse reactions/side effects.   Do not drive when taking Pain medications or sleeping medications (Benzodaizepines)  Do not take more than prescribed Pain, Sleep and Anxiety Medications. It is not advisable to combine anxiety,sleep and pain medications without talking with your primary care practitioner  Special Instructions: If you have smoked or chewed Tobacco  in the last 2 yrs please stop smoking, stop any regular Alcohol  and or any Recreational drug use.  Wear Seat belts while driving.  Please note: You were cared for by a hospitalist during your hospital stay. Once you are discharged, your primary care physician will handle any further medical issues. Please note that NO REFILLS for any discharge medications will be authorized once you are discharged, as it is imperative that you return to your primary care physician (or establish a relationship with a primary care physician if you do not have one) for your post hospital discharge needs so that they can reassess your need for medications and monitor your lab values.   Increase activity slowly   Complete by:  As directed      Allergies as of 08/11/2017   No  Known Allergies     Medication List    STOP taking these medications   amoxicillin 875 MG tablet Commonly known as:  AMOXIL   cyclobenzaprine 5 MG tablet Commonly known as:  FLEXERIL   ibuprofen 800 MG tablet Commonly known as:  ADVIL,MOTRIN   NYQUIL SEVERE COLD/FLU 5-6.25-10-325 MG Caps Generic drug:  Phenyleph-Doxylamine-DM-APAP   oxyCODONE-acetaminophen  5-325 MG tablet Commonly known as:  PERCOCET/ROXICET     TAKE these medications   albuterol 108 (90 Base) MCG/ACT inhaler Commonly known as:  PROVENTIL HFA;VENTOLIN HFA Inhale 2 puffs into the lungs every 6 (six) hours as needed for wheezing or shortness of breath.   feeding supplement (ENSURE ENLIVE) Liqd Take 237 mLs by mouth 3 (three) times daily between meals.   predniSONE 10 MG tablet Commonly known as:  DELTASONE Take 6 tablets (60 mg) daily for 3 days, then, Take 5 tablets (50 mg) daily for 3 days, then, Take 4 tablets (40 mg) daily for 3 days, then, Take 3 tablets (30 mg) daily for 3 days, then, Take 2 tablets (20 mg) daily for 3 days, then, Take 1 tablets (10 mg) daily for 3 days, then, Take 0.5 tablets (5 mg) daily for 3 days, then stop   tiotropium 18 MCG inhalation capsule Commonly known as:  SPIRIVA HANDIHALER Place 1 capsule (18 mcg total) into inhaler and inhale daily.            Durable Medical Equipment  (From admission, onward)        Start     Ordered   08/11/17 0939  DME Oxygen  Once    Question Answer Comment  Mode or (Route) Nasal cannula   Liters per Minute 2   Frequency Continuous (stationary and portable oxygen unit needed)   Oxygen conserving device Yes   Oxygen delivery system Gas      08/11/17 0939     Follow-up Information    Brand Males, MD Follow up on 08/23/2017.   Specialty:  Pulmonary Disease Why:  appt at 11:45 am Contact information: [place] [address] [phone]        Melvenia Needles, NP Follow up on 08/18/2017.   Specialty:  Pulmonary Disease Why:  appt at 10:30 am Contact information: [address] [address] [phone]          No Known Allergies  Consultations:   pulmonary/intensive care  Other Procedures/Studies: Dg Chest 2 View  Result Date: 08/09/2017 CLINICAL DATA:  Shortness of breath. EXAM: CHEST  2 VIEW COMPARISON:  08/08/2017.  08/07/2017.   08/03/2017. FINDINGS: Heart size normal. Diffuse bilateral pulmonary interstitial prominence again noted without interim change. Small bilateral pleural effusions noted. No pneumothorax. Heart size stable. No acute bony abnormality. IMPRESSION: Persistent bilateral pulmonary interstitial without significant change from prior exam. Small bilateral pleural effusions. Electronically Signed   By: Marcello Moores  Register   On: 08/09/2017 10:56   Dg Chest 2 View  Result Date: 08/08/2017 CLINICAL DATA:  Hypoxia. Increased weakness and shortness of breath this morning. EXAM: CHEST  2 VIEW COMPARISON:  08/07/2017 and 08/03/2017 FINDINGS: Extensive bilateral upper and lower lobe and middle lobe hazy infiltrates persist, worse on the right than the left. Slight progression in the right midzone. Slight improvement on the left. No effusions. Heart size and pulmonary vascularity are normal. No bone abnormality. IMPRESSION: Persistent extensive bilateral pulmonary infiltrates, slightly progressed on the right and slightly improved on the left. Electronically Signed   By: Jeneen Rinks  Maxwell M.D.   On: 08/08/2017 11:43   Dg Chest 2 View  Result Date: 08/07/2017 CLINICAL DATA:  Hypoxia EXAM: CHEST  2 VIEW COMPARISON:  08/03/2017 FINDINGS: Cardiac shadow is stable. Diffuse interstitial changes are noted bilaterally consistent with edema. No sizable effusion is noted. No bony abnormality is seen. IMPRESSION: Diffuse interstitial infiltrates bilaterally. Electronically Signed   By: Inez Catalina M.D.   On: 08/07/2017 14:45   Dg Chest 2 View  Result Date: 08/03/2017 CLINICAL DATA:  Flu like symptoms EXAM: CHEST  2 VIEW COMPARISON:  None. FINDINGS: Streaky left lower lobe infiltrate. No pleural effusion. Borderline cardiomegaly. No pneumothorax. IMPRESSION: Streaky left lower lobe infiltrate Electronically Signed   By: Donavan Foil M.D.   On: 08/03/2017 22:59   Ct Chest High Resolution  Result Date: 08/10/2017 CLINICAL DATA:   Inpatient. Acute hypoxic respiratory failure. Persistent bilateral hazy lung opacities. EXAM: CT CHEST WITHOUT CONTRAST TECHNIQUE: Multidetector CT imaging of the chest was performed following the standard protocol without intravenous contrast. High resolution imaging of the lungs, as well as inspiratory and expiratory imaging, was performed. COMPARISON:  Chest radiograph from one day prior. FINDINGS: Cardiovascular: Normal heart size. Trace pericardial effusion/thickening. Three-vessel coronary atherosclerosis. Mildly atherosclerotic nonaneurysmal thoracic aorta. Normal caliber pulmonary arteries. Mediastinum/Nodes: No discrete thyroid nodules. Unremarkable esophagus. No pathologically enlarged axillary, mediastinal or gross hilar lymph nodes, noting limited sensitivity for the detection of hilar adenopathy on this noncontrast study. Lungs/Pleura: No pneumothorax. Small layering bilateral pleural effusions. Mild centrilobular and paraseptal emphysema. Apical right upper lobe 0.8 cm cavitary pulmonary nodule (series 6/image 40). There is extensive patchy ground-glass opacity throughout both lungs involving all lung lobes with associated interlobular septal thickening (crazy paving pattern). There is mild bandlike consolidation in the medial dependent left lower lung lobe with associated volume loss, which persists but decreases mildly on the prone sequence, favor atelectasis or postinfectious/postinflammatory scarring. No significant regions of lobular air trapping on the expiration sequence. No significant regions of traction bronchiectasis, architectural distortion or frank honeycombing. Upper abdomen: Tiny 2 mm calcification in the right renal sinus could represent a nonobstructing stone versus vascular calcification. Musculoskeletal: No aggressive appearing focal osseous lesions. Mild thoracic spondylosis. IMPRESSION: 1. Apical right upper lobe 0.8 cm cavitary pulmonary nodule. Small primary bronchogenic  carcinoma cannot be excluded. Follow-up chest CT recommended in 3-6 months in this high risk patient. 2. Extensive crazy paving pattern throughout both lungs. Acute interstitial pneumonia is favored. If the patient is immunocompromised, opportunistic infections (PCP, CMV) can have this appearance. Differential also includes acute hypersensitivity pneumonitis or diffuse alveolar hemorrhage such as from vasculitis. 3. Bandlike consolidation and volume loss in the medial dependent left lower lobe, favor atelectasis and/or postinfectious/postinflammatory scarring. 4. Small layering bilateral pleural effusions. 5. Mild centrilobular and paraseptal emphysema. 6. Three-vessel coronary atherosclerosis. Aortic Atherosclerosis (ICD10-I70.0) and Emphysema (ICD10-J43.9). Electronically Signed   By: Ilona Sorrel M.D.   On: 08/10/2017 13:37     TODAY-DAY OF DISCHARGE:  Subjective:   Tyler [name] today has no headache,no chest abdominal pain,no new weakness tingling or numbness, feels much better wants to go home today.   Objective:   Blood pressure 133/83, pulse (!) 53, temperature 97.7 F (36.5 C), temperature source Oral, resp. rate 14, height 6\' 2"  (1.88 m), weight 63.6 kg (140 lb 4.8 oz), SpO2 96 %.  Intake/Output Summary (Last 24 hours) at 08/11/2017 0959 Last data filed at 08/11/2017 0800 Gross per 24 hour  Intake 720 ml  Output -  Net 720 ml  Filed Weights   08/06/17 0907 08/07/17 0500 08/09/17 1000  Weight: 65.1 kg (143 lb 8.3 oz) 62.7 kg (138 lb 3.2 oz) 63.6 kg (140 lb 4.8 oz)    Exam: Awake Alert, Oriented *3, No new F.N deficits, Normal affect [place].AT,PERRAL Supple Neck,No JVD, No cervical lymphadenopathy appriciated.  Symmetrical Chest wall movement, Good air movement bilaterally, CTAB RRR,No Gallops,Rubs or new Murmurs, No Parasternal Heave +ve B.Sounds, Abd Soft, Non tender, No organomegaly appriciated, No rebound -guarding or rigidity. No Cyanosis, Clubbing or edema, No new Rash or  bruise   PERTINENT RADIOLOGIC STUDIES: Dg Chest 2 View  Result Date: 08/09/2017 CLINICAL DATA:  Shortness of breath. EXAM: CHEST  2 VIEW COMPARISON:  08/08/2017.  08/07/2017.  08/03/2017. FINDINGS: Heart size normal. Diffuse bilateral pulmonary interstitial prominence again noted without interim change. Small bilateral pleural effusions noted. No pneumothorax. Heart size stable. No acute bony abnormality. IMPRESSION: Persistent bilateral pulmonary interstitial without significant change from prior exam. Small bilateral pleural effusions. Electronically Signed   By: Marcello Moores  Register   On: 08/09/2017 10:56   Dg Chest 2 View  Result Date: 08/08/2017 CLINICAL DATA:  Hypoxia. Increased weakness and shortness of breath this morning. EXAM: CHEST  2 VIEW COMPARISON:  08/07/2017 and 08/03/2017 FINDINGS: Extensive bilateral upper and lower lobe and middle lobe hazy infiltrates persist, worse on the right than the left. Slight progression in the right midzone. Slight improvement on the left. No effusions. Heart size and pulmonary vascularity are normal. No bone abnormality. IMPRESSION: Persistent extensive bilateral pulmonary infiltrates, slightly progressed on the right and slightly improved on the left. Electronically Signed   By: Lorriane Shire M.D.   On: 08/08/2017 11:43   Dg Chest 2 View  Result Date: 08/07/2017 CLINICAL DATA:  Hypoxia EXAM: CHEST  2 VIEW COMPARISON:  08/03/2017 FINDINGS: Cardiac shadow is stable. Diffuse interstitial changes are noted bilaterally consistent with edema. No sizable effusion is noted. No bony abnormality is seen. IMPRESSION: Diffuse interstitial infiltrates bilaterally. Electronically Signed   By: Inez Catalina M.D.   On: 08/07/2017 14:45   Dg Chest 2 View  Result Date: 08/03/2017 CLINICAL DATA:  Flu like symptoms EXAM: CHEST  2 VIEW COMPARISON:  None. FINDINGS: Streaky left lower lobe infiltrate. No pleural effusion. Borderline cardiomegaly. No pneumothorax. IMPRESSION:  Streaky left lower lobe infiltrate Electronically Signed   By: Donavan Foil M.D.   On: 08/03/2017 22:59   Ct Chest High Resolution  Result Date: 08/10/2017 CLINICAL DATA:  Inpatient. Acute hypoxic respiratory failure. Persistent bilateral hazy lung opacities. EXAM: CT CHEST WITHOUT CONTRAST TECHNIQUE: Multidetector CT imaging of the chest was performed following the standard protocol without intravenous contrast. High resolution imaging of the lungs, as well as inspiratory and expiratory imaging, was performed. COMPARISON:  Chest radiograph from one day prior. FINDINGS: Cardiovascular: Normal heart size. Trace pericardial effusion/thickening. Three-vessel coronary atherosclerosis. Mildly atherosclerotic nonaneurysmal thoracic aorta. Normal caliber pulmonary arteries. Mediastinum/Nodes: No discrete thyroid nodules. Unremarkable esophagus. No pathologically enlarged axillary, mediastinal or gross hilar lymph nodes, noting limited sensitivity for the detection of hilar adenopathy on this noncontrast study. Lungs/Pleura: No pneumothorax. Small layering bilateral pleural effusions. Mild centrilobular and paraseptal emphysema. Apical right upper lobe 0.8 cm cavitary pulmonary nodule (series 6/image 40). There is extensive patchy ground-glass opacity throughout both lungs involving all lung lobes with associated interlobular septal thickening (crazy paving pattern). There is mild bandlike consolidation in the medial dependent left lower lung lobe with associated volume loss, which persists but decreases mildly on the prone sequence,  favor atelectasis or postinfectious/postinflammatory scarring. No significant regions of lobular air trapping on the expiration sequence. No significant regions of traction bronchiectasis, architectural distortion or frank honeycombing. Upper abdomen: Tiny 2 mm calcification in the right renal sinus could represent a nonobstructing stone versus vascular calcification. Musculoskeletal: No  aggressive appearing focal osseous lesions. Mild thoracic spondylosis. IMPRESSION: 1. Apical right upper lobe 0.8 cm cavitary pulmonary nodule. Small primary bronchogenic carcinoma cannot be excluded. Follow-up chest CT recommended in 3-6 months in this high risk patient. 2. Extensive crazy paving pattern throughout both lungs. Acute interstitial pneumonia is favored. If the patient is immunocompromised, opportunistic infections (PCP, CMV) can have this appearance. Differential also includes acute hypersensitivity pneumonitis or diffuse alveolar hemorrhage such as from vasculitis. 3. Bandlike consolidation and volume loss in the medial dependent left lower lobe, favor atelectasis and/or postinfectious/postinflammatory scarring. 4. Small layering bilateral pleural effusions. 5. Mild centrilobular and paraseptal emphysema. 6. Three-vessel coronary atherosclerosis. Aortic Atherosclerosis (ICD10-I70.0) and Emphysema (ICD10-J43.9). Electronically Signed   By: Ilona Sorrel M.D.   On: 08/10/2017 13:37     PERTINENT LAB RESULTS: CBC: Recent Labs    08/09/17 0612 08/10/17 0748  WBC 9.1 16.4*  HGB 12.1* 11.2*  HCT 34.7* 32.7*  PLT 224 251   CMET CMP     Component Value Date/Time   NA 139 08/10/2017 0748   K 4.1 08/10/2017 0748   CL 104 08/10/2017 0748   CO2 27 08/10/2017 0748   GLUCOSE 125 (H) 08/10/2017 0748   BUN 17 08/10/2017 0748   CREATININE 0.62 08/10/2017 0748   CALCIUM 8.3 (L) 08/10/2017 0748   PROT 6.6 08/09/2017 0612   ALBUMIN 2.4 (L) 08/09/2017 0612   AST 46 (H) 08/09/2017 0612   ALT 30 08/09/2017 0612   ALKPHOS 44 08/09/2017 0612   BILITOT 1.0 08/09/2017 0612   GFRNONAA >60 08/10/2017 0748   GFRAA >60 08/10/2017 0748    GFR Estimated Creatinine Clearance: 90.5 mL/min (by C-G formula based on SCr of 0.62 mg/dL). No results for input(s): LIPASE, AMYLASE in the last 72 hours. Recent Labs    08/09/17 0612  TROPONINI <0.03   Invalid input(s): POCBNP No results for input(s):  DDIMER in the last 72 hours. No results for input(s): HGBA1C in the last 72 hours. Recent Labs    08/09/17 0612  CHOL 104  HDL 24*  LDLCALC 60  TRIG 100  CHOLHDL 4.3   Recent Labs    08/08/17 1222  TSH 1.515   No results for input(s): VITAMINB12, FOLATE, FERRITIN, TIBC, IRON, RETICCTPCT in the last 72 hours. Coags: No results for input(s): INR in the last 72 hours.  Invalid input(s): PT Microbiology: Recent Results (from the past 240 hour(s))  Culture, blood (routine x 2) Call MD if unable to obtain prior to antibiotics being given     Status: None   Collection Time: 08/04/17  1:51 AM  Result Value Ref Range Status   Specimen Description BLOOD LEFT ANTECUBITAL  Final   Special Requests   Final    BOTTLES DRAWN AEROBIC AND ANAEROBIC Blood Culture adequate volume   Culture   Final    NO GROWTH 5 DAYS Performed at [facility], [address] [place], [place] [address]    Report Status 08/09/2017 FINAL  Final  Culture, blood (routine x 2) Call MD if unable to obtain prior to antibiotics being given     Status: None   Collection Time: 08/04/17  2:05 AM  Result Value Ref Range  Status   Specimen Description BLOOD LEFT HAND  Final   Special Requests IN PEDIATRIC BOTTLE Blood Culture adequate volume  Final   Culture   Final    NO GROWTH 5 DAYS Performed at [facility], [address] [place], [place] [address]    Report Status 08/09/2017 FINAL  Final  C difficile quick scan w PCR reflex     Status: None   Collection Time: 08/04/17  6:55 PM  Result Value Ref Range Status   C Diff antigen NEGATIVE NEGATIVE Final   C Diff toxin NEGATIVE NEGATIVE Final   C Diff interpretation No C. difficile detected.  Final  Gastrointestinal Panel by PCR , Stool     Status: None   Collection Time: 08/04/17  6:55 PM  Result Value Ref Range Status   Campylobacter species NOT DETECTED NOT DETECTED Final   Plesimonas shigelloides NOT DETECTED NOT DETECTED Final   Salmonella  species NOT DETECTED NOT DETECTED Final   Yersinia enterocolitica NOT DETECTED NOT DETECTED Final   Vibrio species NOT DETECTED NOT DETECTED Final   Vibrio cholerae NOT DETECTED NOT DETECTED Final   Enteroaggregative E coli (EAEC) NOT DETECTED NOT DETECTED Final   Enteropathogenic E coli (EPEC) NOT DETECTED NOT DETECTED Final   Enterotoxigenic E coli (ETEC) NOT DETECTED NOT DETECTED Final   Shiga like toxin producing E coli (STEC) NOT DETECTED NOT DETECTED Final   Shigella/Enteroinvasive E coli (EIEC) NOT DETECTED NOT DETECTED Final   Cryptosporidium NOT DETECTED NOT DETECTED Final   Cyclospora cayetanensis NOT DETECTED NOT DETECTED Final   Entamoeba histolytica NOT DETECTED NOT DETECTED Final   Giardia lamblia NOT DETECTED NOT DETECTED Final   Adenovirus F40/41 NOT DETECTED NOT DETECTED Final   Astrovirus NOT DETECTED NOT DETECTED Final   Norovirus GI/GII NOT DETECTED NOT DETECTED Final   Rotavirus A NOT DETECTED NOT DETECTED Final   Sapovirus (I, II, IV, and V) NOT DETECTED NOT DETECTED Final    Comment: Performed at [facility], [address] [place], [place] [address]  Culture, sputum-assessment     Status: None   Collection Time: 08/04/17 11:45 PM  Result Value Ref Range Status   Specimen Description SPUTUM  Final   Special Requests Normal  Final   Sputum evaluation   Final    Sputum specimen not acceptable for testing.  Please recollect.   NOTIFIED DEBBIE HILL RN 1.25.19 @0211  ZANDO,C    Report Status 08/05/2017 FINAL  Final  Respiratory Panel by PCR     Status: None   Collection Time: 08/08/17  3:42 PM  Result Value Ref Range Status   Adenovirus NOT DETECTED NOT DETECTED Final   Coronavirus 229E NOT DETECTED NOT DETECTED Final   Coronavirus HKU1 NOT DETECTED NOT DETECTED Final   Coronavirus NL63 NOT DETECTED NOT DETECTED Final   Coronavirus OC43 NOT DETECTED NOT DETECTED Final   Metapneumovirus NOT DETECTED NOT DETECTED Final   Rhinovirus / Enterovirus NOT  DETECTED NOT DETECTED Final   Influenza A NOT DETECTED NOT DETECTED Final   Influenza B NOT DETECTED NOT DETECTED Final   Parainfluenza Virus 1 NOT DETECTED NOT DETECTED Final   Parainfluenza Virus 2 NOT DETECTED NOT DETECTED Final   Parainfluenza Virus 3 NOT DETECTED NOT DETECTED Final   Parainfluenza Virus 4 NOT DETECTED NOT DETECTED Final   Respiratory Syncytial Virus NOT DETECTED NOT DETECTED Final   Bordetella pertussis NOT DETECTED NOT DETECTED Final   Chlamydophila pneumoniae NOT DETECTED NOT DETECTED Final  Mycoplasma pneumoniae NOT DETECTED NOT DETECTED Final    Comment: Performed at [facility], [place] [address], [place], [place] [address]    FURTHER DISCHARGE INSTRUCTIONS:  Get Medicines reviewed and adjusted: Please take all your medications with you for your next visit with your Primary MD  Laboratory/radiological data: Please request your Primary MD to go over all hospital tests and procedure/radiological results at the follow up, please ask your Primary MD to get all Hospital records sent to his/her office.  In some cases, they will be blood work, cultures and biopsy results pending at the time of your discharge. Please request that your primary care M.D. goes through all the records of your hospital data and follows up on these results.  Also Note the following: If you experience worsening of your admission symptoms, develop shortness of breath, life threatening emergency, suicidal or homicidal thoughts you must seek medical attention immediately by calling 911 or calling your MD immediately  if symptoms less severe.  You must read complete instructions/literature along with all the possible adverse reactions/side effects for all the Medicines you take and that have been prescribed to you. Take any new Medicines after you have completely understood and accpet all the possible adverse reactions/side effects.   Do not drive when taking Pain medications or sleeping  medications (Benzodaizepines)  Do not take more than prescribed Pain, Sleep and Anxiety Medications. It is not advisable to combine anxiety,sleep and pain medications without talking with your primary care practitioner  Special Instructions: If you have smoked or chewed Tobacco  in the last 2 yrs please stop smoking, stop any regular Alcohol  and or any Recreational drug use.  Wear Seat belts while driving.  Please note: You were cared for by a hospitalist during your hospital stay. Once you are discharged, your primary care physician will handle any further medical issues. Please note that NO REFILLS for any discharge medications will be authorized once you are discharged, as it is imperative that you return to your primary care physician (or establish a relationship with a primary care physician if you do not have one) for your post hospital discharge needs so that they can reassess your need for medications and monitor your lab values.  Total Time spent coordinating discharge including counseling, education and face to face time equals 45 minutes.  SignedOren Binet 08/11/2017 9:59 AM

## 2017-08-11 NOTE — Progress Notes (Signed)
Date: August 11, 2017 [name], BSN, [place], [place] [facility] notified of need for o2 at home. Next review date: [id number]

## 2017-08-11 NOTE — Telephone Encounter (Signed)
rec'd staff message from Salvadore Dom NP stating patient is currently in [facility] and needs a CXR when he arrives for his appt with Rexene Edison NP on 08-18-2017 for hospital f/u.  Routing message to [place] for review

## 2017-08-11 NOTE — Progress Notes (Signed)
Pts IV removed with a clean and dry dressing intact. Pt denies pain at the time of d/c. Pt given d/c instructions and two oxygen tanks provided by [facility]. Pt taken to the front entrance with nursing staff and family present

## 2017-08-11 NOTE — Progress Notes (Signed)
Name: Tyler [name] MRN: [id number] DOB: [birth year]    ADMISSION DATE:  08/03/2017 CONSULTATION DATE:  1/28  REFERRING MD :  Grandville Silos  CHIEF COMPLAINT: on-going hypoxia   BRIEF PATIENT DESCRIPTION:   57 year old male admitted on 1/23 with influenza A PCR positive and presumed secondary community-acquired pneumonia, treated with appropriate antibiotics as well as Tamiflu however in spite of therapy continues to have diffuse pulmonary infiltrates and hypoxia as of 1/28  SIGNIFICANT EVENTS  1/23 admitted, placed on Tamiflu, Rocephin and azithromycin 1/26 still short of breath, chest x-ray ordered.  1/27 chest x-ray with diffuse pulmonary infiltrates, Lasix given, orthostatic with this 1/28 cardiology asked to evaluate as well as pulmonary  STUDIES:  Echo 1/28:Left ventricle:  The cavity size was normal. Systolic function was normal. The estimated ejection fraction was in the range of 60% to 65%. Wall motion was normal; there were no regional wall motion abnormalities. Doppler parameters are consistent with abnormal left ventricular relaxation (grade 1 diastolic dysfunction). Doppler parameters are consistent with indeterminate ventricular filling Pressure.  Hypersensitivity pneumonitis panel 1/31: >>> High resolution CT chest 1/30: 1. Apical right upper lobe 0.8 cm cavitary pulmonary nodule. Small primary bronchogenic carcinoma cannot be excluded. Follow-up chest CT recommended in 3-6 months in this high risk patient. 2. Extensive crazy paving pattern throughout both lungs. Acute interstitial pneumonia is favored. If the patient is immunocompromised, opportunistic infections (PCP, CMV) can have this appearance. Differential also includes acute hypersensitivity pneumonitis or diffuse alveolar hemorrhage such as from vasculitis. 3. Bandlike consolidation and volume loss in the medial dependent left lower lobe, favor atelectasis and/or postinfectious/postinflammatory scarring. 4. Small  layering bilateral pleural effusions. 5. Mild centrilobular and paraseptal emphysema. 6. Three-vessel coronary atherosclerosis.   Microbiology: Blood culture 1/24>>>neg GI PCR panel 1/24: Negative Differential PCR 1/24: Negative HIV antibody 1/24: Negative Influenza PCR 1/23+ for influenza A Urine strep and Legionella antigen on 1/23: Negative   Antibiotics/antimicrobials: Results for orders placed or performed during the hospital encounter of 08/03/17  Culture, blood (routine x 2) Call MD if unable to obtain prior to antibiotics being given     Status: None   Collection Time: 08/04/17  1:51 AM  Result Value Ref Range Status   Specimen Description BLOOD LEFT ANTECUBITAL  Final   Special Requests   Final    BOTTLES DRAWN AEROBIC AND ANAEROBIC Blood Culture adequate volume   Culture   Final    NO GROWTH 5 DAYS Performed at [facility], [place] [address] [place], [place] [address]    Report Status 08/09/2017 FINAL  Final  Culture, blood (routine x 2) Call MD if unable to obtain prior to antibiotics being given     Status: None   Collection Time: 08/04/17  2:05 AM  Result Value Ref Range Status   Specimen Description BLOOD LEFT HAND  Final   Special Requests IN PEDIATRIC BOTTLE Blood Culture adequate volume  Final   Culture   Final    NO GROWTH 5 DAYS Performed at [facility] [address], [place], [place] [address]    Report Status 08/09/2017 FINAL  Final  C difficile quick scan w PCR reflex     Status: None   Collection Time: 08/04/17  6:55 PM  Result Value Ref Range Status   C Diff antigen NEGATIVE NEGATIVE Final   C Diff toxin NEGATIVE NEGATIVE Final   C Diff interpretation No C. difficile detected.  Final  Gastrointestinal Panel by PCR , Stool  Status: None   Collection Time: 08/04/17  6:55 PM  Result Value Ref Range Status   Campylobacter species NOT DETECTED NOT DETECTED Final   Plesimonas shigelloides NOT DETECTED NOT DETECTED Final   Salmonella  species NOT DETECTED NOT DETECTED Final   Yersinia enterocolitica NOT DETECTED NOT DETECTED Final   Vibrio species NOT DETECTED NOT DETECTED Final   Vibrio cholerae NOT DETECTED NOT DETECTED Final   Enteroaggregative E coli (EAEC) NOT DETECTED NOT DETECTED Final   Enteropathogenic E coli (EPEC) NOT DETECTED NOT DETECTED Final   Enterotoxigenic E coli (ETEC) NOT DETECTED NOT DETECTED Final   Shiga like toxin producing E coli (STEC) NOT DETECTED NOT DETECTED Final   Shigella/Enteroinvasive E coli (EIEC) NOT DETECTED NOT DETECTED Final   Cryptosporidium NOT DETECTED NOT DETECTED Final   Cyclospora cayetanensis NOT DETECTED NOT DETECTED Final   Entamoeba histolytica NOT DETECTED NOT DETECTED Final   Giardia lamblia NOT DETECTED NOT DETECTED Final   Adenovirus F40/41 NOT DETECTED NOT DETECTED Final   Astrovirus NOT DETECTED NOT DETECTED Final   Norovirus GI/GII NOT DETECTED NOT DETECTED Final   Rotavirus A NOT DETECTED NOT DETECTED Final   Sapovirus (I, II, IV, and V) NOT DETECTED NOT DETECTED Final    Comment: Performed at [facility], [address] [place], [place] [address]  Culture, sputum-assessment     Status: None   Collection Time: 08/04/17 11:45 PM  Result Value Ref Range Status   Specimen Description SPUTUM  Final   Special Requests Normal  Final   Sputum evaluation   Final    Sputum specimen not acceptable for testing.  Please recollect.   NOTIFIED DEBBIE HILL RN 1.25.19 @0211  ZANDO,C    Report Status 08/05/2017 FINAL  Final  Respiratory Panel by PCR     Status: None   Collection Time: 08/08/17  3:42 PM  Result Value Ref Range Status   Adenovirus NOT DETECTED NOT DETECTED Final   Coronavirus 229E NOT DETECTED NOT DETECTED Final   Coronavirus HKU1 NOT DETECTED NOT DETECTED Final   Coronavirus NL63 NOT DETECTED NOT DETECTED Final   Coronavirus OC43 NOT DETECTED NOT DETECTED Final   Metapneumovirus NOT DETECTED NOT DETECTED Final   Rhinovirus / Enterovirus NOT  DETECTED NOT DETECTED Final   Influenza A NOT DETECTED NOT DETECTED Final   Influenza B NOT DETECTED NOT DETECTED Final   Parainfluenza Virus 1 NOT DETECTED NOT DETECTED Final   Parainfluenza Virus 2 NOT DETECTED NOT DETECTED Final   Parainfluenza Virus 3 NOT DETECTED NOT DETECTED Final   Parainfluenza Virus 4 NOT DETECTED NOT DETECTED Final   Respiratory Syncytial Virus NOT DETECTED NOT DETECTED Final   Bordetella pertussis NOT DETECTED NOT DETECTED Final   Chlamydophila pneumoniae NOT DETECTED NOT DETECTED Final   Mycoplasma pneumoniae NOT DETECTED NOT DETECTED Final    Comment: Performed at [facility], [place]. [address] [place], [place] [address]      SUBJECTIVE:  08/11/2017 -still O2 dependent    VITAL SIGNS: Temp:  [97.5 F (36.4 C)-97.9 F (36.6 C)] 97.7 F (36.5 C) (01/31 0535) Pulse Rate:  [42-74] 53 (01/31 0535) Resp:  [13-18] 14 (01/31 0535) BP: (133-153)/(83-97) 133/83 (01/31 0535) SpO2:  [96 %-99 %] 96 % (01/31 0818)  PHYSICAL EXAMINATION:  General: This is a 57 year old African-American male currently sitting upright in bed he is in no acute distress HEENT: Normocephalic atraumatic no jugular venous distention mucous membranes are moist Pulmonary: Posterior rales appreciated, no accessory use Cardiac:  Regular rate and rhythm without murmur Extremities: Brisk cap refill no edema strong pulses Abdomen soft nontender positive bowel sounds GU: Voiding quantity sufficient Neuro: Awake alert no focal deficits  PULMONARY Recent Labs  Lab 08/08/17 1056  PHART 7.514*  PCO2ART 33.5  PO2ART 66.4*  HCO3 26.5  O2SAT 91.8    CBC Recent Labs  Lab 08/08/17 0547 08/09/17 0612 08/10/17 0748  HGB 13.0 12.1* 11.2*  HCT 37.1* 34.7* 32.7*  WBC 11.2* 9.1 16.4*  PLT 179 224 251    COAGULATION No results for input(s): INR in the last 168 hours.  CARDIAC   Recent Labs  Lab 08/04/17 1311 08/09/17 0612  TROPONINI 0.03* <0.03   No results for input(s):  PROBNP in the last 168 hours.   CHEMISTRY Recent Labs  Lab 08/05/17 0547 08/06/17 8921 08/06/17 1448 08/07/17 0558 08/08/17 0547 08/09/17 0612 08/10/17 0748 08/11/17 0634  NA 128* 124* 128* 131* 132* 133* 139  --   K 3.0* 3.5 3.7 3.5 4.0 4.0 4.1  --   CL 95* 94* 93* 95* 95* 98* 104  --   CO2 23 23 23 25 28 26 27   --   GLUCOSE 107* 115* 131* 115* 114* 146* 125*  --   BUN 8 6 7 9 10 12 17   --   CREATININE 0.62 0.67 0.80 0.69 0.80 0.56* 0.62  --   CALCIUM 7.6* 7.3* 7.9* 7.9* 8.1* 7.8* 8.3*  --   MG 1.5* 1.8  --   --   --  2.0 2.2 2.1  PHOS  --   --   --   --   --  3.3 2.7 3.3   Estimated Creatinine Clearance: 90.5 mL/min (by C-G formula based on SCr of 0.62 mg/dL).   LIVER Recent Labs  Lab 08/09/17 0612  AST 46*  ALT 30  ALKPHOS 44  BILITOT 1.0  PROT 6.6  ALBUMIN 2.4*     INFECTIOUS Recent Labs  Lab 08/08/17 1420 08/11/17 0634  PROCALCITON 1.63 0.39     ENDOCRINE CBG (last 3)  No results for input(s): GLUCAP in the last 72 hours.       IMAGING x48h  - image(s) personally visualized  -   highlighted in bold Dg Chest 2 View  Result Date: 08/09/2017 CLINICAL DATA:  Shortness of breath. EXAM: CHEST  2 VIEW COMPARISON:  08/08/2017.  08/07/2017.  08/03/2017. FINDINGS: Heart size normal. Diffuse bilateral pulmonary interstitial prominence again noted without interim change. Small bilateral pleural effusions noted. No pneumothorax. Heart size stable. No acute bony abnormality. IMPRESSION: Persistent bilateral pulmonary interstitial without significant change from prior exam. Small bilateral pleural effusions. Electronically Signed   By: Marcello Moores  Register   On: 08/09/2017 10:56   Ct Chest High Resolution  Result Date: 08/10/2017 CLINICAL DATA:  Inpatient. Acute hypoxic respiratory failure. Persistent bilateral hazy lung opacities. EXAM: CT CHEST WITHOUT CONTRAST TECHNIQUE: Multidetector CT imaging of the chest was performed following the standard protocol without  intravenous contrast. High resolution imaging of the lungs, as well as inspiratory and expiratory imaging, was performed. COMPARISON:  Chest radiograph from one day prior. FINDINGS: Cardiovascular: Normal heart size. Trace pericardial effusion/thickening. Three-vessel coronary atherosclerosis. Mildly atherosclerotic nonaneurysmal thoracic aorta. Normal caliber pulmonary arteries. Mediastinum/Nodes: No discrete thyroid nodules. Unremarkable esophagus. No pathologically enlarged axillary, mediastinal or gross hilar lymph nodes, noting limited sensitivity for the detection of hilar adenopathy on this noncontrast study. Lungs/Pleura: No pneumothorax. Small layering bilateral pleural effusions. Mild centrilobular and paraseptal emphysema. Apical right upper  lobe 0.8 cm cavitary pulmonary nodule (series 6/image 40). There is extensive patchy ground-glass opacity throughout both lungs involving all lung lobes with associated interlobular septal thickening (crazy paving pattern). There is mild bandlike consolidation in the medial dependent left lower lung lobe with associated volume loss, which persists but decreases mildly on the prone sequence, favor atelectasis or postinfectious/postinflammatory scarring. No significant regions of lobular air trapping on the expiration sequence. No significant regions of traction bronchiectasis, architectural distortion or frank honeycombing. Upper abdomen: Tiny 2 mm calcification in the right renal sinus could represent a nonobstructing stone versus vascular calcification. Musculoskeletal: No aggressive appearing focal osseous lesions. Mild thoracic spondylosis. IMPRESSION: 1. Apical right upper lobe 0.8 cm cavitary pulmonary nodule. Small primary bronchogenic carcinoma cannot be excluded. Follow-up chest CT recommended in 3-6 months in this high risk patient. 2. Extensive crazy paving pattern throughout both lungs. Acute interstitial pneumonia is favored. If the patient is  immunocompromised, opportunistic infections (PCP, CMV) can have this appearance. Differential also includes acute hypersensitivity pneumonitis or diffuse alveolar hemorrhage such as from vasculitis. 3. Bandlike consolidation and volume loss in the medial dependent left lower lobe, favor atelectasis and/or postinfectious/postinflammatory scarring. 4. Small layering bilateral pleural effusions. 5. Mild centrilobular and paraseptal emphysema. 6. Three-vessel coronary atherosclerosis. Aortic Atherosclerosis (ICD10-I70.0) and Emphysema (ICD10-J43.9). Electronically Signed   By: Ilona Sorrel M.D.   On: 08/10/2017 13:37     ASSESSMENT / PLAN:  Acute Hypoxic respiratory failure in setting of bilateral pulmonary infiltrates: suspect viral pneumonitis in setting of influenza A vs ALI  vs Organizing PNA following viral PNA.  HIV negative -HIV NR 08/11/2017 - acute resp failure - d4 steroids. Still O2 dependent.  CT scan: apica RUL cavitary Pulmonary nodule. Extensive "crazy paving pattern" t/o both lungs. Raising concern for interstitial PNA  Plan F/u autoimmune/vasculitis panel Cont steroids (will d/w Dr Chase Caller re: timing of taper) Send HSP panel  Defer to Dr Chase Caller re: need for bronch?  Will set him up in our office for f/u and further imaging after above addressed.  Will need to go home w/ oxygen   Cavitary RUL nodule. Very small. ? Infectious vs cancer.  -PCT neg Plan Cont current rx Will need f/u imaging   Erick Colace ACNP-BC [place] Pager # [phone] OR # [phone] if no answer

## 2017-08-12 LAB — ALDOLASE: ALDOLASE: 13 U/L — AB (ref 3.3–10.3)

## 2017-08-12 LAB — CYCLIC CITRUL PEPTIDE ANTIBODY, IGG/IGA: CCP Antibodies IgG/IgA: 3 units (ref 0–19)

## 2017-08-12 LAB — EPSTEIN-BARR VIRUS VCA, IGG: EBV VCA IGG: 57.4 U/mL — AB (ref 0.0–17.9)

## 2017-08-12 LAB — ANTI-DNA ANTIBODY, DOUBLE-STRANDED: DS DNA AB: 1 [IU]/mL (ref 0–9)

## 2017-08-12 LAB — MPO/PR-3 (ANCA) ANTIBODIES
ANCA Proteinase 3: <3.5 U/mL (ref 0.0–3.5)
Myeloperoxidase Abs: <9 U/mL (ref 0.0–9.0)

## 2017-08-12 LAB — ANTI-SCLERODERMA ANTIBODY

## 2017-08-12 LAB — EPSTEIN-BARR VIRUS VCA, IGM: EBV VCA IgM: <36 U/mL (ref 0.0–35.9)

## 2017-08-12 LAB — ANTINUCLEAR ANTIBODIES, IFA: ANA Ab, IFA: NEGATIVE

## 2017-08-12 LAB — SJOGRENS SYNDROME-A EXTRACTABLE NUCLEAR ANTIBODY

## 2017-08-12 LAB — GLOMERULAR BASEMENT MEMBRANE ANTIBODIES: GBM AB: 2 U (ref 0–20)

## 2017-08-12 LAB — CMV IGM: CMV IgM: <30 AU/mL (ref 0.0–29.9)

## 2017-08-12 LAB — SJOGRENS SYNDROME-B EXTRACTABLE NUCLEAR ANTIBODY

## 2017-08-14 LAB — RSV(RESPIRATORY SYNCYTIAL VIRUS) AB, BLOOD

## 2017-08-15 LAB — HYPERSENSITIVITY PNEUMONITIS
A. Pullulans Abs: NEGATIVE
A.Fumigatus #1 Abs: NEGATIVE
MICROPOLYSPORA FAENI IGG: NEGATIVE
PIGEON SERUM ABS: NEGATIVE
THERMOACT. SACCHARII: NEGATIVE
THERMOACTINOMYCES VULGARIS IGG: NEGATIVE

## 2017-08-17 NOTE — Telephone Encounter (Signed)
Patient cancelled his 2.7.19 appt with TP and rescheduled for 3.5.19 w/ MR in the ILD clinic Routing to [facility] so that patient can receive his cxr at time of visit

## 2017-08-18 ENCOUNTER — Inpatient Hospital Stay: Payer: Self-pay | Admitting: Adult Health

## 2017-08-18 ENCOUNTER — Ambulatory Visit (INDEPENDENT_AMBULATORY_CARE_PROVIDER_SITE_OTHER): Payer: Self-pay | Admitting: Internal Medicine

## 2017-08-18 ENCOUNTER — Encounter: Payer: Self-pay | Admitting: Internal Medicine

## 2017-08-18 VITALS — BP 130/82 | HR 86 | Ht 74.0 in | Wt 148.6 lb

## 2017-08-18 DIAGNOSIS — J8489 Other specified interstitial pulmonary diseases: Secondary | ICD-10-CM

## 2017-08-18 DIAGNOSIS — J9601 Acute respiratory failure with hypoxia: Secondary | ICD-10-CM

## 2017-08-18 DIAGNOSIS — J101 Influenza due to other identified influenza virus with other respiratory manifestations: Secondary | ICD-10-CM

## 2017-08-18 MED ORDER — PREDNISONE 10 MG PO TABS: ORAL_TABLET | ORAL | 0 refills | Status: DC

## 2017-08-18 NOTE — Patient Instructions (Signed)
ICD-10-CM   1. Acute respiratory failure with hypoxia (HCC) J96.01   2. BOOP (bronchiolitis obliterans with organizing pneumonia) ([place]) J84.89   3. Influenza A J10.1     Glad you are better Your oxygen needs have improved  Plan - ok to have o2 tank for now - change prednisone to as follows  - Please take Take prednisone 40mg  once daily x 3 days, then 30mg  once daily x 3 days, then 20mg  once daily x 3 days, then prednisone 10mg  once daily  x 3 days and then prednisone 5mg  daily x 3 days stop  Followup  cxr In 4 weeks Return to see me or APP in 4 weeks in regular clinic (cancel ILD clinic)

## 2017-08-18 NOTE — Telephone Encounter (Signed)
Patient seen today by MR.  Follow up appt has been made. Nothing further needed.

## 2017-08-18 NOTE — Progress Notes (Signed)
Subjective:     Patient ID: Tyler [name], male   DOB: 12/14/1960, 57 y.o.   MRN: [id number]  HPI  OV 08/18/2017  Chief Complaint  Patient presents with  . Follow-up    post flu and pna, only cough at night currently     Tyler [name] was hospitalized for influenza A on August 03, 2017.  In the aftermath of that he was hypoxemic with infiltrates.  A post viral Boop was diagnosed.  He was started on steroids but he did not improve.  Therefore he was discharged with a prednisone taper and oxygen.  He returns for follow-up.  Most of his symptoms have resolved by now.  There is no fatigue.  He is walking but he still feels he needs to use the oxygen.  He still has a cough.  However today when we walked him in the office without any oxygen he did not desaturate.  There is a significant improvement.  There are no new issues.  When we look at his medication it looks like he is not doing his prednisone taper correctly.  He is only taking a tablet every 3 days.  There are many tablets left in the bottle    has a past medical history of Alcohol abuse, Chronic back pain, and Tobacco abuse.   reports that he quit smoking about 2 weeks ago. His smoking use included cigarettes. He has a 40.00 pack-year smoking history. he has never used smokeless tobacco.  No past surgical history on file.  No Known Allergies   There is no immunization history on file for this patient.  Family History  Problem Relation Age of Onset  . Heart attack Maternal Grandfather      Current Outpatient Medications:  .  albuterol (PROVENTIL HFA;VENTOLIN HFA) 108 (90 Base) MCG/ACT inhaler, Inhale 2 puffs into the lungs every 6 (six) hours as needed for wheezing or shortness of breath., Disp: 1 Inhaler, Rfl: 0 .  feeding supplement, ENSURE ENLIVE, (ENSURE ENLIVE) LIQD, Take 237 mLs by mouth 3 (three) times daily between meals., Disp: 90 Bottle, Rfl: 0 .  fluticasone (FLONASE) 50 MCG/ACT nasal spray, Place 1 spray into both  nostrils daily., Disp: , Rfl:  .  predniSONE (DELTASONE) 10 MG tablet, Take 6 tablets (60 mg) daily for 3 days, then, Take 5 tablets (50 mg) daily for 3 days, then, Take 4 tablets (40 mg) daily for 3 days, then, Take 3 tablets (30 mg) daily for 3 days, then, Take 2 tablets (20 mg) daily for 3 days, then, Take 1 tablets (10 mg) daily for 3 days, then, Take 0.5 tablets (5 mg) daily for 3 days, then stop, Disp: 62 tablet, Rfl: 0 .  tiotropium (SPIRIVA HANDIHALER) 18 MCG inhalation capsule, Place 1 capsule (18 mcg total) into inhaler and inhale daily. (Patient not taking: Reported on 08/18/2017), Disp: 30 capsule, Rfl: 0   Review of Systems     Objective:   Physical Exam  Constitutional: He is oriented to person, place, and time. He appears well-developed and well-nourished. No distress.  HENT:  Head: Normocephalic and atraumatic.  Right Ear: External ear normal.  Left Ear: External ear normal.  Mouth/Throat: Oropharynx is clear and moist. No oropharyngeal exudate.  Eyes: Conjunctivae and EOM are normal. Pupils are equal, round, and reactive to light. Right eye exhibits no discharge. Left eye exhibits no discharge. No scleral icterus.  Neck: Normal range of motion. Neck supple. No JVD present. No tracheal deviation present. No thyromegaly present.  Cardiovascular: Normal rate, regular rhythm and intact distal pulses. Exam reveals no gallop and no friction rub.  No murmur heard. Pulmonary/Chest: Effort normal. No respiratory distress. He has no wheezes. He has rales. He exhibits no tenderness.  Some crackles in both base  Abdominal: Soft. Bowel sounds are normal. He exhibits no distension and no mass. There is no tenderness. There is no rebound and no guarding.  Musculoskeletal: Normal range of motion. He exhibits no edema or tenderness.  Lymphadenopathy:    He has no cervical adenopathy.  Neurological: He is alert and oriented to person, place, and time. He has normal reflexes. No cranial nerve  deficit. Coordination normal.  Skin: Skin is warm and dry. No rash noted. He is not diaphoretic. No erythema. No pallor.  Psychiatric: He has a normal mood and affect. His behavior is normal. Judgment and thought content normal.  Nursing note and vitals reviewed.  Vitals:   08/18/17 1047  BP: 130/82  Pulse: 86  SpO2: 99%  Weight: 148 lb 9.6 oz (67.4 kg)  Height: 6\' 2"  (1.88 m)    Estimated body mass index is 19.08 kg/m as calculated from the following:   Height as of this encounter: 6\' 2"  (1.88 m).   Weight as of this encounter: 148 lb 9.6 oz (67.4 kg).     Assessment:       ICD-10-CM   1. Acute respiratory failure with hypoxia (HCC) J96.01   2. BOOP (bronchiolitis obliterans with organizing pneumonia) ([place]) J84.89   3. Influenza A J10.1        Plan:      Glad you are better Your oxygen needs have improved  Plan - ok to have o2 tank for now - change prednisone to as follows  - Please take Take prednisone 40mg  once daily x 3 days, then 30mg  once daily x 3 days, then 20mg  once daily x 3 days, then prednisone 10mg  once daily  x 3 days and then prednisone 5mg  daily x 3 days stop  Followup  cxr In 4 weeks Return to see me or APP in 4 weeks in regular clinic (cancel ILD clinic)    Dr. Brand Males, M.D., [facility].C.P Pulmonary and Critical Care Medicine Staff Physician, [place] Director - Interstitial Lung Disease  Program  Pulmonary [place] at [place], [place], [address]  Pager: [phone], If no answer or between  15:00h - 7:00h: call [phone] Telephone: [phone]

## 2017-08-23 ENCOUNTER — Ambulatory Visit: Payer: Self-pay | Admitting: Internal Medicine

## 2017-08-29 ENCOUNTER — Telehealth: Payer: Self-pay | Admitting: Internal Medicine

## 2017-08-29 NOTE — Telephone Encounter (Signed)
Left message for patient to call back  

## 2017-08-30 NOTE — Telephone Encounter (Signed)
lmtcb x2 for pt. 

## 2017-08-31 NOTE — Telephone Encounter (Signed)
lmtcb x3 for pt. 

## 2017-09-01 NOTE — Telephone Encounter (Signed)
lmtcb X4 for pt.  Will close per triage protocol.

## 2017-09-13 ENCOUNTER — Ambulatory Visit: Payer: Self-pay | Admitting: Internal Medicine

## 2017-09-19 ENCOUNTER — Ambulatory Visit (INDEPENDENT_AMBULATORY_CARE_PROVIDER_SITE_OTHER): Payer: Self-pay | Admitting: Internal Medicine

## 2017-09-19 ENCOUNTER — Encounter: Payer: Self-pay | Admitting: Internal Medicine

## 2017-09-19 ENCOUNTER — Ambulatory Visit (INDEPENDENT_AMBULATORY_CARE_PROVIDER_SITE_OTHER)
Admission: RE | Admit: 2017-09-19 | Discharge: 2017-09-19 | Disposition: A | Discharge status: Home / Self Care | Payer: Self-pay | Source: Ambulatory Visit | Attending: Internal Medicine | Admitting: Internal Medicine

## 2017-09-19 VITALS — BP 112/78 | HR 107 | Ht 74.0 in | Wt 147.0 lb

## 2017-09-19 DIAGNOSIS — J8489 Other specified interstitial pulmonary diseases: Secondary | ICD-10-CM

## 2017-09-19 DIAGNOSIS — J9601 Acute respiratory failure with hypoxia: Secondary | ICD-10-CM

## 2017-09-19 NOTE — Patient Instructions (Addendum)
ICD-10-CM   1. Acute respiratory failure with hypoxia (HCC) J96.01   2. BOOP (bronchiolitis obliterans with organizing pneumonia) ([place]) J84.89    Get cxr 2 viewl; will call with results  Based on results hve to decide timing of CT chest  The combination of the above 2 results will inform us why you are short of breath and needing oxyen at 30th minute   Plan  -0 will call with cxr results - might need to see cardiology or PCP  Patient, No Pcp Per for fast heart rate

## 2017-09-19 NOTE — Progress Notes (Signed)
Subjective:     Patient ID: Tyler [name], male   DOB: Sep 18, 1960, 57 y.o.   MRN: [id number]  HPI   OV 08/18/2017  Chief Complaint  Patient presents with  . Follow-up    post flu and pna, only cough at night currently     Tyler [name] was hospitalized for influenza A on August 03, 2017.  In the aftermath of that he was hypoxemic with infiltrates.  A post viral Boop was diagnosed.  He was started on steroids but he did not improve.  Therefore he was discharged with a prednisone taper and oxygen.  He returns for follow-up.  Most of his symptoms have resolved by now.  There is no fatigue.  He is walking but he still feels he needs to use the oxygen.  He still has a cough.  However today when we walked him in the office without any oxygen he did not desaturate.  There is a significant improvement.  There are no new issues.  When we look at his medication it looks like he is not doing his prednisone taper correctly.  He is only taking a tablet every 3 days.  There are many tablets left in the bottle    has a past medical history of Alcohol abuse, Chronic back pain, and Tobacco abuse.   reports that he quit smoking about 2 weeks ago. His smoking use included cigarettes. He has a 40.00 pack-year smoking history. he has never used smokeless tobacco.  No past surgical history on file.  No Known Allergies   There is no immunization history on file for this patient.   OV 09/19/2017  Chief Complaint  Patient presents with  . Follow-up    Has SOB when walking for long preiods of  time. Wears oxygen when needed during the day. Having chest tightness after walking. No cough at this time.   Follow-up influenza A complicated by acute hypoxemic respiratory failure with infiltrates August 03, 2017.  Treated with empiric prednisone  Last visit was August 18, 2017.  At that time we noticed that he was no longer hypoxemic on limited submaximal exertion inside the office.  Therefore we tapered and  stopped his prednisone.  Today presents for follow-up.  He still smokes although not as much.  He says that his energy levels and fatigue levels have all returned to baseline.  The only exception is that when he walks 30 minutes daily as part of his daily routine he gets dyspneic at the 30th minute and needs to take oxygen.  He also has chest tightness at this time.  This is definitely worse than baseline.  Currently not taking prednisone.  He will take an inhaler to relieve the symptoms.  At this point in time it is not clear if he is compliant with the schedule inhalers.  Walking desaturation test 185 feet x3 laps on room air with a forehead probe in the office: did not desaturate.   Walking desaturation test on 09/19/2017 185 feet x 3 laps on ROOM AIR:  did not desaturate - walked all 3 laps fast. Rest pulse ox was 100%, final pulse ox was 100%. HR response 109/min at rest to 111/min at peak exertion. Patient Tyler [name]  Did nto Desaturate < 88% . [name] did not  Desaturated </= 3% points. [name] yes did get tachyardic  EKG 09/19/2017 - HR 88 sinus and looks normal to me        has a  past medical history of Alcohol abuse, Chronic back pain, and Tobacco abuse.   reports that he quit smoking about 6 weeks ago. His smoking use included cigarettes. He has a 40.00 pack-year smoking history. he has never used smokeless tobacco.  No past surgical history on file.  No Known Allergies   There is no immunization history on file for this patient.  Family History  Problem Relation Age of Onset  . Heart attack Maternal Grandfather      Current Outpatient Medications:  .  albuterol (PROVENTIL HFA;VENTOLIN HFA) 108 (90 Base) MCG/ACT inhaler, Inhale 2 puffs into the lungs every 6 (six) hours as needed for wheezing or shortness of breath., Disp: 1 Inhaler, Rfl: 0 .  feeding supplement, ENSURE ENLIVE, (ENSURE ENLIVE) LIQD, Take 237 mLs by mouth 3 (three) times daily between  meals., Disp: 90 Bottle, Rfl: 0 .  fluticasone (FLONASE) 50 MCG/ACT nasal spray, Place 1 spray into both nostrils daily., Disp: , Rfl:  .  tiotropium (SPIRIVA HANDIHALER) 18 MCG inhalation capsule, Place 1 capsule (18 mcg total) into inhaler and inhale daily. (Patient not taking: Reported on 09/19/2017), Disp: 30 capsule, Rfl: 0  Review of Systems     Objective:   Physical Exam  Constitutional: He is oriented to person, place, and time. He appears well-developed and well-nourished. No distress.  HENT:  Head: Normocephalic and atraumatic.  Right Ear: External ear normal.  Left Ear: External ear normal.  Mouth/Throat: Oropharynx is clear and moist. No oropharyngeal exudate.  Eyes: Conjunctivae and EOM are normal. Pupils are equal, round, and reactive to light. Right eye exhibits no discharge. Left eye exhibits no discharge. No scleral icterus.  Neck: Normal range of motion. Neck supple. No JVD present. No tracheal deviation present. No thyromegaly present.  Cardiovascular: Normal rate, regular rhythm and intact distal pulses. Exam reveals no gallop and no friction rub.  No murmur heard. Pulmonary/Chest: Effort normal and breath sounds normal. No respiratory distress. He has no wheezes. He has no rales. He exhibits no tenderness.  Abdominal: Soft. Bowel sounds are normal. He exhibits no distension and no mass. There is no tenderness. There is no rebound and no guarding.  Musculoskeletal: Normal range of motion. He exhibits no edema or tenderness.  Lymphadenopathy:    He has no cervical adenopathy.  Neurological: He is alert and oriented to person, place, and time. He has normal reflexes. No cranial nerve deficit. Coordination normal.  Skin: Skin is warm and dry. No rash noted. He is not diaphoretic. No erythema. No pallor.  Psychiatric: He has a normal mood and affect. His behavior is normal. Judgment and thought content normal.  Nursing note and vitals reviewed.  There were no vitals filed  for this visit.  Estimated body mass index is 19.08 kg/m as calculated from the following:   Height as of 08/18/17: 6\' 2"  (1.88 m).   Weight as of 08/18/17: 148 lb 9.6 oz (67.4 kg).     Assessment:       ICD-10-CM   1. Acute respiratory failure with hypoxia (HCC) J96.01   2. BOOP (bronchiolitis obliterans with organizing pneumonia) ([place]) J84.89        Plan:      Get cxr 2 viewl; will call with results Based on results hve to decide timing of CT chest The combination of the above 2 results will inform us why you are short of breath and needing oxyen at 30th minute   (> 50% of this 15 min visit spent in  face to face counseling or/and coordination of care)   Dr. Brand Males, M.D., [facility].C.P Pulmonary and Critical Care Medicine Staff Physician, [place] Director - Interstitial Lung Disease  Program  Pulmonary [place] at [place], [place], [address]  Pager: [phone], If no answer or between  15:00h - 7:00h: call [phone] Telephone: [phone]

## 2017-09-20 ENCOUNTER — Telehealth: Payer: Self-pay | Admitting: Internal Medicine

## 2017-09-20 DIAGNOSIS — J8489 Other specified interstitial pulmonary diseases: Secondary | ICD-10-CM

## 2017-09-20 NOTE — Telephone Encounter (Signed)
cxt shows continued improvement  Plan  - use o2 as needed - do ct chest without contrast sometime early to mid may 2019 and come for followup  Dg Chest 2 View  Result Date: 09/19/2017 CLINICAL DATA:  Shortness of Breath EXAM: CHEST - 2 VIEW COMPARISON:  Chest radiograph August 09, 2017 and chest CT August 10, 2017 FINDINGS: There has been clearing of airspace consolidation from both lungs. Currently, there is no appreciable edema or consolidation. The heart size and pulmonary vascularity are normal. No adenopathy. No evident bone lesions. IMPRESSION: Interval clearing of airspace opacity from the lungs. Currently no edema or consolidation. Note that a small cavitary lesion seen in the right upper lobe on prior CT is not evident by radiography. This finding may warrant correlation with noncontrast enhanced chest CT to further assess this area. Electronically Signed   By: Lowella Grip III M.D.   On: 09/19/2017 13:19

## 2017-09-21 NOTE — Telephone Encounter (Signed)
ATC pt, no answer. Left message for pt to call back.  

## 2017-09-21 NOTE — Telephone Encounter (Signed)
Spoke with pt, advised message from Dr. Chase Caller and he wanted to know when he is supposed to come back for follow up. I advised him, per your last office note, he should see cardiology and his PCP. Please advise MR.

## 2017-09-22 NOTE — Telephone Encounter (Signed)
Called patient unable to reach left message to give us a call back.

## 2017-09-22 NOTE — Telephone Encounter (Signed)
My phone note says to do the CT in mid may and come for followup. He can even do the CT in  june

## 2017-09-23 NOTE — Telephone Encounter (Signed)
lmtcb x2 for pt. 

## 2017-09-27 NOTE — Telephone Encounter (Signed)
Talked with patient's wife.  Was told to have Tyler [name] to call office back.

## 2017-09-28 NOTE — Telephone Encounter (Signed)
Spoke with the pt and notified ct due in mid may 2019 and then f/u here  He verbalized understanding  Order was sent to [facility]

## 2017-09-29 ENCOUNTER — Telehealth: Payer: Self-pay | Admitting: Internal Medicine

## 2017-09-29 NOTE — Telephone Encounter (Signed)
Please see 09/21/17 phone.  ATC pt- unable to leave vm due to mailbox being full.

## 2017-09-30 NOTE — Telephone Encounter (Signed)
atc pt X2, no answer and vm full.  wcb.

## 2017-11-23 ENCOUNTER — Ambulatory Visit (INDEPENDENT_AMBULATORY_CARE_PROVIDER_SITE_OTHER)
Admission: RE | Admit: 2017-11-23 | Discharge: 2017-11-23 | Disposition: A | Discharge status: Home / Self Care | Payer: Self-pay | Source: Ambulatory Visit | Attending: Internal Medicine | Admitting: Internal Medicine

## 2017-11-23 DIAGNOSIS — J8489 Other specified interstitial pulmonary diseases: Secondary | ICD-10-CM

## 2017-11-24 ENCOUNTER — Encounter: Payer: Self-pay | Admitting: Internal Medicine

## 2017-11-24 ENCOUNTER — Telehealth: Payer: Self-pay | Admitting: Internal Medicine

## 2017-11-24 ENCOUNTER — Ambulatory Visit (INDEPENDENT_AMBULATORY_CARE_PROVIDER_SITE_OTHER): Payer: Self-pay | Admitting: Internal Medicine

## 2017-11-24 VITALS — BP 108/70 | HR 81 | Ht 74.0 in | Wt 145.4 lb

## 2017-11-24 DIAGNOSIS — J439 Emphysema, unspecified: Secondary | ICD-10-CM

## 2017-11-24 DIAGNOSIS — R911 Solitary pulmonary nodule: Secondary | ICD-10-CM

## 2017-11-24 DIAGNOSIS — J8489 Other specified interstitial pulmonary diseases: Secondary | ICD-10-CM

## 2017-11-24 MED ORDER — TIOTROPIUM BROMIDE MONOHYDRATE 18 MCG IN CAPS: 18.0000 ug | ORAL_CAPSULE | Freq: Every day | RESPIRATORY_TRACT | 1 refills | Status: DC

## 2017-11-24 NOTE — Telephone Encounter (Signed)
Let [name] know that radiologist actually thinks one of the nodule is bigger   Plan PET Scan asap and return to see me Also spiro with BD response and DLCO Need to see him next few weeks  Dr. Brand Males, M.D., [facility].C.P Pulmonary and Critical Care Medicine Staff Physician, [place] Director - Interstitial Lung Disease  Program  Pulmonary [place] at [place], [place], [address]  Pager: [phone], If no answer or between  15:00h - 7:00h: call [phone] Telephone: [phone]   Ct Chest Wo Contrast  Result Date: 11/24/2017 CLINICAL DATA:  Follow-up pneumonia and right upper lobe pulmonary nodule. EXAM: CT CHEST WITHOUT CONTRAST TECHNIQUE: Multidetector CT imaging of the chest was performed following the standard protocol without IV contrast. COMPARISON:  08/10/2017 FINDINGS: Cardiovascular: No acute findings. Aortic and coronary artery atherosclerosis. Mediastinum/Nodes: No masses or pathologically enlarged lymph nodes identified on this unenhanced exam. Lungs/Pleura: Diffuse bilateral pulmonary airspace disease and small bilateral pleural effusions have resolved since previous study. Mild pleural-parenchymal scarring is seen bilaterally. Persistent solid pulmonary nodule is seen in the right upper lobe measuring 9 mm on image 38/3, without significant change in size compared to previous study. A spiculated nodule is seen in the medial right lower lobe measuring 13 mm on image 117/3. This appears increased in size from 10 mm on previous exam. A 5 mm irregular pulmonary nodule is seen in the posterior right lower lobe on image 122/3, without significant change. A focal cluster of tiny less than 5 mm nodules is seen in the posterior left upper lobe which remains stable. An 8 mm pulmonary nodule is seen in the posterior left lower lobe on image 148/3, which was not seen on previous study but may have been obscured by  airspace disease and atelectasis in this region on prior exam. Upper Abdomen:  Unremarkable. Musculoskeletal:  No suspicious bone lesions. IMPRESSION: Interval resolution of bilateral airspace disease and pleural effusions. Persistent bilateral indeterminate pulmonary nodules, largest in the right lower lobe measuring 13 mm and mildly increased in size since prior study. PET-CT is suggested for further evaluation. No evidence of lymphadenopathy. Aortic Atherosclerosis (ICD10-I70.0). Coronary artery calcification. Electronically Signed   By: Earle Gell M.D.   On: 11/24/2017 10:03

## 2017-11-24 NOTE — Patient Instructions (Signed)
BOOP (bronchiolitis obliterans with organizing pneumonia) ([place]) due to fLu A - Jan 2019  - good is resolvedo n CT May 2019 and glad you are feeling better - test ono test on room air and if results normal will have to discontinue night o2   Left upper lobe pulmonary nodule 39mm - Jan 2019 - seems same MAy 2019 CT but official report pending - you need repeat CT chest - will tell you timing based on formal report   Pulmonary emphysema, unspecified emphysema type ([place]) - restart spiriva daily - not sure why you do not have it, is a scheduled daily inhaler  - albuterol is as needed only - do full PFT in 3 months  Followup 3 months after full PFT

## 2017-11-24 NOTE — Telephone Encounter (Signed)
Attempted to call pt. Spoke with his spouse Malachi Paradise who stated pt was not home at the time.  Stated to her to have pt return call once he arrived back home.  Hassel Neth the office phone number for her to have pt call us at.
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
## 2017-11-25 NOTE — Telephone Encounter (Signed)
Called and spoke with patient. Advised patient of results. Patient was scheduled to see Wyn Quaker in August as well as a PFT then. Per MR I rescheduled PFT and OV for an earlier time with him. PFT has been scheduled for 5.23.19 and OV with MR has been scheduled for 5.28.19.   Advised patient that someone else will call him about scheduling PET scan. Patient verbalized understanding. Nothing further needed at this time.

## 2017-11-25 NOTE — Telephone Encounter (Signed)
Patient called back regarding result - he can be reached [phone] -pr

## 2017-11-25 NOTE — Telephone Encounter (Signed)
Pt is calling back [phone]

## 2017-11-29 ENCOUNTER — Ambulatory Visit (HOSPITAL_COMMUNITY)
Admission: RE | Admit: 2017-11-29 | Discharge: 2017-11-29 | Disposition: A | Discharge status: Home / Self Care | Payer: Self-pay | Source: Ambulatory Visit | Attending: Internal Medicine | Admitting: Internal Medicine

## 2017-11-29 ENCOUNTER — Telehealth: Payer: Self-pay | Admitting: Internal Medicine

## 2017-11-29 DIAGNOSIS — J439 Emphysema, unspecified: Secondary | ICD-10-CM | POA: Insufficient documentation

## 2017-11-29 DIAGNOSIS — J984 Other disorders of lung: Secondary | ICD-10-CM | POA: Insufficient documentation

## 2017-11-29 DIAGNOSIS — I700 Atherosclerosis of aorta: Secondary | ICD-10-CM | POA: Insufficient documentation

## 2017-11-29 DIAGNOSIS — K5730 Diverticulosis of large intestine without perforation or abscess without bleeding: Secondary | ICD-10-CM | POA: Insufficient documentation

## 2017-11-29 DIAGNOSIS — I2510 Atherosclerotic heart disease of native coronary artery without angina pectoris: Secondary | ICD-10-CM | POA: Insufficient documentation

## 2017-11-29 DIAGNOSIS — K760 Fatty (change of) liver, not elsewhere classified: Secondary | ICD-10-CM | POA: Insufficient documentation

## 2017-11-29 DIAGNOSIS — R918 Other nonspecific abnormal finding of lung field: Secondary | ICD-10-CM | POA: Insufficient documentation

## 2017-11-29 DIAGNOSIS — R911 Solitary pulmonary nodule: Secondary | ICD-10-CM | POA: Insufficient documentation

## 2017-11-29 LAB — GLUCOSE, CAPILLARY: GLUCOSE-CAPILLARY: 89 mg/dL (ref 65–99)

## 2017-11-29 MED ORDER — FLUDEOXYGLUCOSE F - 18 (FDG) INJECTION
7.2300 | Freq: Once | INTRAVENOUS | Status: AC | PRN
  Administered 2017-11-29: 7.23 via INTRAVENOUS

## 2017-11-29 NOTE — Telephone Encounter (Signed)
Let [name] know that PET scan shows that uptake in nodule is low . So, best we keep an eye on it. Plan - do repeat CT chest super D protocol in 4 months and then ROV  Also ->  Also has  does have coronary artery calcification and if no normal cardiac stress test past few years; please refer to cardiologist - [facility] or Dr Einar Gip, first available  LEt him know other finding - he has silent gall strone and kidney stone but these are not bothering him.   Nm Pet Image Initial (pi) Skull Base To Thigh  Result Date: 11/29/2017 CLINICAL DATA:  Initial treatment strategy for solitary pulmonary nodule. EXAM: NUCLEAR MEDICINE PET SKULL BASE TO THIGH TECHNIQUE: 7.2 mCi F-18 FDG was injected intravenously. Full-ring PET imaging was performed from the skull base to thigh after the radiotracer. CT data was obtained and used for attenuation correction and anatomic localization. Fasting blood glucose: Eighty-nine mg/dl COMPARISON:  Multiple exams, including chest CT from 11/23/2016 FINDINGS: Mediastinal blood pool activity: SUV max 2.2 NECK: Bilateral small in upper normal internal jugular lymph nodes (primarily level II a) are present but are not hypermetabolic or overtly pathologically enlarged. Mild chronic left maxillary sinusitis. Incidental CT findings: Mild atherosclerotic calcification of the common carotid arteries. CHEST: Right apical 0.9 by 0.7 cm nodule on image 14/8 has maximum SUV 0.6. The clustered nodularity in the left upper lobe posteriorly on image 25/8 does not have appreciable hypermetabolic activity. The indistinctly marginated peribronchovascular nodule measuring about 0.9 by 1.2 cm on image 50/8 medially in the right lower lobe has a maximum SUV of 2.1. The 5 mm ground-glass nodule in the right lower lobe on image 51/8 has a maximum SUV of 0.5. The bandlike nodularity in the posterior basal segment left lower lobe shown Incidental CT findings: Biapical pleuroparenchymal scarring. Coronary,  aortic arch, and branch vessel atherosclerotic vascular disease. Mild centrilobular emphysema. ABDOMEN/PELVIS: No significant abnormal hypermetabolic activity in this region. Incidental CT findings: Diffuse hepatic steatosis. Suspected cholelithiasis. Nonobstructive bilateral nephrolithiasis. Aortoiliac atherosclerotic vascular disease. Sigmoid colon diverticulosis. SKELETON: No significant abnormal hypermetabolic activity in this region. Incidental CT findings: none IMPRESSION: 1. There is low-grade metabolic activity in the indistinctly marginated peribronchovascular nodule medially in the right lower lobe on image 50/8, with maximum SUV of 2.1. This could conceivably represent low-grade malignancy. This lesion would be tricky to sample by percutaneous biopsy due to its proximity to the heart. I would suggest short-term chest CT follow up in 3-6 months time for surveillance. 2. The other nodules are low in metabolic activity and probably postinflammatory/benign. Some of these nodules are below sensitive PET-CT size thresholds. 3. No hypermetabolic adenopathy or other abnormal hypermetabolic activity. 4. Other imaging findings of potential clinical significance: Aortic Atherosclerosis (ICD10-I70.0) and Emphysema (ICD10-J43.9). Coronary atherosclerosis. Biapical pleuroparenchymal scarring probably from remote granulomatous disease. Diffuse hepatic steatosis. Bilateral. Suspected cholelithiasis nonobstructive nephrolithiasis. Sigmoid colon diverticulosis. Electronically Signed   By: Van Clines M.D.   On: 11/29/2017 17:28

## 2017-12-01 ENCOUNTER — Ambulatory Visit (INDEPENDENT_AMBULATORY_CARE_PROVIDER_SITE_OTHER): Payer: Self-pay | Admitting: Internal Medicine

## 2017-12-01 DIAGNOSIS — J439 Emphysema, unspecified: Secondary | ICD-10-CM

## 2017-12-01 NOTE — Progress Notes (Signed)
Patient was unable to perform test. Patient could not take a deep breath in or blow out long enough without coughing.

## 2017-12-06 ENCOUNTER — Ambulatory Visit (INDEPENDENT_AMBULATORY_CARE_PROVIDER_SITE_OTHER): Payer: Self-pay | Admitting: Internal Medicine

## 2017-12-06 ENCOUNTER — Encounter: Payer: Self-pay | Admitting: Internal Medicine

## 2017-12-06 VITALS — BP 112/58 | HR 81 | Ht 74.0 in | Wt 141.0 lb

## 2017-12-06 DIAGNOSIS — J8489 Other specified interstitial pulmonary diseases: Secondary | ICD-10-CM

## 2017-12-06 DIAGNOSIS — R911 Solitary pulmonary nodule: Secondary | ICD-10-CM

## 2017-12-06 DIAGNOSIS — J439 Emphysema, unspecified: Secondary | ICD-10-CM

## 2017-12-06 DIAGNOSIS — I2510 Atherosclerotic heart disease of native coronary artery without angina pectoris: Secondary | ICD-10-CM

## 2017-12-06 NOTE — Progress Notes (Signed)
Subjective:     Tyler ID: Tyler [name], male   DOB: 04-23-1961, 57 y.o.   MRN: [id number]  HPI  OV 08/18/2017  Chief Complaint  Tyler presents with  . Follow-up    post flu and pna, only cough at night currently     Tyler [name] was hospitalized for influenza A on August 03, 2017.  In the aftermath of that he was hypoxemic with infiltrates.  A post viral Boop was diagnosed.  He was started on steroids but he did not improve.  Therefore he was discharged with a prednisone taper and oxygen.  He returns for follow-up.  Most of his symptoms have resolved by now.  There is no fatigue.  He is walking but he still feels he needs to use the oxygen.  He still has a cough.  However today when we walked him in the office without any oxygen he did not desaturate.  There is a significant improvement.  There are no new issues.  When we look at his medication it looks like he is not doing his prednisone taper correctly.  He is only taking a tablet every 3 days.  There are many tablets left in the bottle    has a past medical history of Alcohol abuse, Chronic back pain, and Tobacco abuse.   reports that he quit smoking about 2 weeks ago. His smoking use included cigarettes. He has a 40.00 pack-year smoking history. he has never used smokeless tobacco.  No past surgical history on file.  No Known Allergies   There is no immunization history on file for this Tyler.   OV 09/19/2017  Chief Complaint  Tyler presents with  . Follow-up    Has SOB when walking for long preiods of  time. Wears oxygen when needed during the day. Having chest tightness after walking. No cough at this time.   Follow-up influenza A complicated by acute hypoxemic respiratory failure with infiltrates August 03, 2017.  Treated with empiric prednisone  Last visit was August 18, 2017.  At that time we noticed that he was no longer hypoxemic on limited submaximal exertion inside the office.  Therefore we tapered and  stopped his prednisone.  Today presents for follow-up.  He still smokes although not as much.  He says that his energy levels and fatigue levels have all returned to baseline.  The only exception is that when he walks 30 minutes daily as part of his daily routine he gets dyspneic at the 30th minute and needs to take oxygen.  He also has chest tightness at this time.  This is definitely worse than baseline.  Currently not taking prednisone.  He will take an inhaler to relieve the symptoms.  At this point in time it is not clear if he is compliant with the schedule inhalers.  Walking desaturation test 185 feet x3 laps on room air with a forehead probe in the office: did not desaturate.   Walking desaturation test on 09/19/2017 185 feet x 3 laps on ROOM AIR:  did not desaturate - walked all 3 laps fast. Rest pulse ox was 100%, final pulse ox was 100%. HR response 109/min at rest to 111/min at peak exertion. Tyler [name]  Did nto Desaturate < 88% . Tyler [name] did not  Desaturated </= 3% points. Tyler [name] yes did get tachyardic  EKG 09/19/2017 - HR 88 sinus and looks normal to me   OV 11/24/2017  Chief Complaint  Tyler presents  with  . Follow-up    CT done 5/15.  Pt states he is still having some pains in his chest and feels like his heart is "going to jump out of chest."     Follow-up several issues  -Influenza A in January 2019 with diffuse pulmonary infiltrates and a Boop pattern: This was clinically resolving at the time of last visit in March 2019.  He had follow-up CT scan of the chest Nov 23, 2017.  Official report is pending.  I personally visualized the CT chest and all the pulmonary infiltrates have completely resolved in my opinion.  He is feeling much better.  Of note, he still is psychologically dependent on oxygen.  He wants to use it at night although I am not so sure he desaturates.  He is definitely not desaturating with exertion at the time of her last visit.  He  does not want to give up the portable oxygen we will have to address this at some point in time in the future  Emphysema: Emphysema was noted in his CT chest January 2019.  I personally visualized the current CT chest and do support those findings.  I given Spiriva at last visit but he is not taking it.  In fact he does not remember it.  He only has albuterol as needed.  He does have some mild exertional dyspnea I have encouraged him to use the Spiriva.  Left upper lobe lung nodule 8 mm: This was seen in January 2019 CT chest.  In my personal visualization this is still persisting without change but the official report is pending.  OV 12/06/2017  Chief Complaint  Tyler presents with  . Follow-up    Follow up for PET scan results. States breathing has been the same since the last visit.      Tyler [name] presents for follow-up for his abnormal CT scan.  At last visit last week he had CT scan of the chest for left upper lobe nodule.  At the time of his visit the official report was not back yet.  I personally thought the nodule was the same but radiologist called back saying nodule has grown.  We then did a PET scan and it shows low grade uptake.  In addition he had asymptomatic cholelithiasis nephrolithiasis and coronary artery calcification.  He does not have any chest pains.  So he is here for follow-up after doing pulmonary function test.  Of note he could not perform his pulmonary function test.  He has no new complaints other than the fact that he has not started his Spiriva schedule because potentially of insurance issues because he is self-pay.  In addition I am not sure is had his overnight oxygen study.   has a past medical history of Alcohol abuse, Chronic back pain, and Tobacco abuse.   reports that he has been smoking cigarettes.  He has a 40.00 pack-year smoking history. He has never used smokeless tobacco.  No past surgical history on file.  No Known Allergies   There is no  immunization history on file for this Tyler.  Family History  Problem Relation Age of Onset  . Heart attack Maternal Grandfather      Current Outpatient Medications:  .  albuterol (PROVENTIL HFA;VENTOLIN HFA) 108 (90 Base) MCG/ACT inhaler, Inhale 2 puffs into the lungs every 6 (six) hours as needed for wheezing or shortness of breath., Disp: 1 Inhaler, Rfl: 0 .  feeding supplement, ENSURE ENLIVE, (ENSURE ENLIVE) LIQD, Take  237 mLs by mouth 3 (three) times daily between meals., Disp: 90 Bottle, Rfl: 0 .  fluticasone (FLONASE) 50 MCG/ACT nasal spray, Place 1 spray into both nostrils daily., Disp: , Rfl:  .  tiotropium (SPIRIVA HANDIHALER) 18 MCG inhalation capsule, Place 1 capsule (18 mcg total) into inhaler and inhale daily., Disp: 90 capsule, Rfl: 1    Review of Systems     Objective:   Physical Exam Today's Vitals   12/06/17 0858  BP: (!) 112/58  Pulse: 81  SpO2: 96%  Weight: 141 lb (64 kg)  Height: 6\' 2"  (1.88 m)    Discussion onkly visit    Assessment:       ICD-10-CM   1. Left upper lobe pulmonary nodule R91.1   2. BOOP (bronchiolitis obliterans with organizing pneumonia) ([place]) J84.89   3. Pulmonary emphysema, unspecified emphysema type ([place]) J43.9   4. Coronary artery calcification seen on CAT scan I25.10        Plan:     BOOP (bronchiolitis obliterans with organizing pneumonia) ([place]) due to fLu A - Jan 2019  - resolved - CMA will ensure there is order for ONO test from last week  Left upper lobe pulmonary nodule 35mm - Jan 2019 - grew in may 2019 CT chest but PET scan may 2019 is low uptake - so cancer probability is low-intermediate  - plan: will order CT chest super D protocol without contrast in 4 months  Pulmonary emphysema, unspecified emphysema type ([place]) - restart spiriva daily scheduled inhaler: CMA will call pharmacy to ensure we sent it last week - albuterol is as needed only - do full PFT in 3 months  coronary artery calcification a  - will  refer to cardiologist - [facility] or Dr Einar Gip, first available   Followup 4  months after CT chest  (> 50% of this 15 min visit spent in face to face counseling or/and coordination of care)

## 2017-12-06 NOTE — Patient Instructions (Addendum)
BOOP (bronchiolitis obliterans with organizing pneumonia) ([place]) due to fLu A - Jan 2019  - resolved - CMA will ensure there is order for ONO test from last week  Left upper lobe pulmonary nodule 65mm - Jan 2019 - grew in may 2019 CT chest but PET scan may 2019 is low uptake - so cancer probability is low-intermediate  - plan: will order CT chest super D protocol without contrast in 4 months  Pulmonary emphysema, unspecified emphysema type ([place]) - restart spiriva daily scheduled inhaler: CMA will call pharmacy to ensure we sent it last week - albuterol is as needed only - do full PFT in 3 months  coronary artery calcification a  - will refer to cardiologist - [facility] or Dr Einar Gip, first available   Followup 4  months after CT chest

## 2017-12-07 ENCOUNTER — Telehealth: Payer: Self-pay | Admitting: Internal Medicine

## 2017-12-07 NOTE — Telephone Encounter (Signed)
This was all discussed in 5/28 office visit.

## 2017-12-07 NOTE — Telephone Encounter (Signed)
Called and spoke with pt stating that [facility] had tried to call him regarding order that was placed on 5/16 for ONO to be done.  Gave pt the phone number and ext he needed to call to discuss ONO test with them.  At the time talking to pt, pt stated he was currently using his O2 due to the weather outside and is unable to breathe due to the weather.  Pt stated he would call [facility].  Nothing further needed at this time.

## 2017-12-08 ENCOUNTER — Encounter: Payer: Self-pay | Admitting: Internal Medicine

## 2017-12-12 ENCOUNTER — Other Ambulatory Visit: Payer: Self-pay | Admitting: Internal Medicine

## 2017-12-12 DIAGNOSIS — I2510 Atherosclerotic heart disease of native coronary artery without angina pectoris: Secondary | ICD-10-CM

## 2017-12-20 ENCOUNTER — Telehealth: Payer: Self-pay | Admitting: Internal Medicine

## 2017-12-20 NOTE — Telephone Encounter (Signed)
ono 12/08/17 - pulse ox </= 88% at 1 min 32 sec. Does NOT need night o2. Can dc night o2   Dr. Brand Males, M.D., [facility].C.P Pulmonary and Critical Care Medicine Staff Physician, [place] Director - Interstitial Lung Disease  Program  Pulmonary [place] at [place], [place], [address]  Pager: [phone], If no answer or between  15:00h - 7:00h: call [phone] Telephone: [phone]

## 2017-12-21 ENCOUNTER — Telehealth: Payer: Self-pay | Admitting: Internal Medicine

## 2017-12-21 DIAGNOSIS — J9601 Acute respiratory failure with hypoxia: Secondary | ICD-10-CM

## 2017-12-21 NOTE — Telephone Encounter (Signed)
Pt is returning call. Cb is [phone].

## 2017-12-21 NOTE — Telephone Encounter (Signed)
Left message at cb number [phone] to call office for results.

## 2017-12-21 NOTE — Telephone Encounter (Signed)
Called patient unable to reach left message to give us a call back.

## 2017-12-21 NOTE — Telephone Encounter (Signed)
Will route to Tyler [name] to follow up on.

## 2017-12-21 NOTE — Telephone Encounter (Signed)
Pt is [phone]

## 2017-12-21 NOTE — Telephone Encounter (Signed)
Called patient, unable to reach left message to give us a call back. 

## 2017-12-21 NOTE — Telephone Encounter (Signed)
Called patient back two more times. Unable to reach patient. Left message to give [place] a call back.

## 2017-12-22 NOTE — Telephone Encounter (Signed)
Spoke with pt. He is aware of his results. Order has been placed to discontinue oxygen. Nothing further was needed.

## 2017-12-22 NOTE — Telephone Encounter (Signed)
Brand Males, MD    ono 12/08/17 - pulse ox </= 88% at 1 min 32 sec. Does NOT need night o2. Can dc night o2   Dr. Brand Males, M.D., [facility].C.P Pulmonary and Critical Care Medicine Staff Physician, [place] Director - Interstitial Lung Disease  Program  P[facility] [place] at [place], [place], [address]  Pager: [phone], If no answer or between  15:00h - 7:00h: call [phone] Telephone: [phone]     ------------------------------------------ Attempted to call pt. I did not receive an answer. I have left a message for pt to return our call.

## 2017-12-29 ENCOUNTER — Telehealth: Payer: Self-pay | Admitting: Internal Medicine

## 2017-12-29 NOTE — Telephone Encounter (Signed)
I looked through pt's chart and I am not seeing where anyone from our office tried calling pt.  Called pt and spoke with both pt and his spouse Malachi [place] to see if they know who had tried contacting them but at the time while I was on the phone, they were unable to listen to the message while on the phone with me.  Jacklyn asked if I could call them back in a few minutes, which I agreed to.       Called pt back and spoke with pt's spouse [place] who stated they had received a call from [place] about pt needing to call back.  Routing message to [place].

## 2018-01-02 NOTE — Telephone Encounter (Signed)
MR had put in referral for pt to see Dr Einar Gip or [place] for first available appt.  I had sent referral to Dr Irven Shelling office.  I spoke to [facility] at Dr Irven Shelling office last week & she told me she had called on 6/4 & 6/6 & left vm's and on 6/14 she spoke to pt's wife and wife was going to have pt to call them.  I had left a vm last week for pt letting him know Dr Irven Shelling office had been trying to contact him and that he needs to call them & gave him Dr Irven Shelling phone #.  Called pt today & spoke to his wife.  Explained the above info to her.  She states she will have her husband to call them for appt.  Nothing further needed.

## 2018-02-24 ENCOUNTER — Ambulatory Visit: Payer: Self-pay | Admitting: Pulmonary Disease

## 2018-03-03 ENCOUNTER — Encounter: Payer: Self-pay | Admitting: Internal Medicine

## 2018-03-03 ENCOUNTER — Ambulatory Visit: Payer: Self-pay

## 2018-03-03 ENCOUNTER — Ambulatory Visit (INDEPENDENT_AMBULATORY_CARE_PROVIDER_SITE_OTHER): Payer: Self-pay | Admitting: Internal Medicine

## 2018-03-03 VITALS — BP 138/82 | HR 95 | Ht 74.0 in | Wt 135.8 lb

## 2018-03-03 DIAGNOSIS — I2510 Atherosclerotic heart disease of native coronary artery without angina pectoris: Secondary | ICD-10-CM

## 2018-03-03 DIAGNOSIS — J439 Emphysema, unspecified: Secondary | ICD-10-CM

## 2018-03-03 DIAGNOSIS — R911 Solitary pulmonary nodule: Secondary | ICD-10-CM

## 2018-03-03 LAB — PULMONARY FUNCTION TEST
FEF 25-75 Pre: 2.37 L/sec
FEF2575-%Pred-Pre: 73 %
FEV1-%Pred-Pre: 85 %
FEV1-Pre: 3 L
FEV1FVC-%Pred-Pre: 92 %
FEV6-%Pred-Pre: 96 %
FEV6-Pre: 4.18 L
FEV6FVC-%PRED-PRE: 103 %
FVC-%PRED-PRE: 92 %
FVC-PRE: 4.18 L
PRE FEV6/FVC RATIO: 100 %
Pre FEV1/FVC ratio: 72 %

## 2018-03-03 MED ORDER — TIOTROPIUM BROMIDE MONOHYDRATE 18 MCG IN CAPS: 18.0000 ug | ORAL_CAPSULE | Freq: Every day | RESPIRATORY_TRACT | 1 refills | Status: DC

## 2018-03-03 MED ORDER — ALBUTEROL SULFATE HFA 108 (90 BASE) MCG/ACT IN AERS: 2.0000 | INHALATION_SPRAY | Freq: Four times a day (QID) | RESPIRATORY_TRACT | 0 refills | Status: DC | PRN

## 2018-03-03 NOTE — Patient Instructions (Addendum)
Left upper lobe pulmonary nodule 4mm - Jan 2019 -> grew in may 2019 CT chest but PET scan may 2019 is low uptake -    - do SUPER D CT chest without contrast in next few weeks; will call with result  Pulmonary emphysema, unspecified emphysema type ([place]) -- too bad you could do not pft 03/03/2018  - sorry to hear you misplaced inhaler in city bus - refill spiriva respimat to take daily; take 1 sample - refill albuterol as needed   coronary artery calcification a  -did you go see cardiology after our referral in may 2019; if not re-refer  Followup - will call you with CT results next few weeks  - routine followup in 3 months

## 2018-03-03 NOTE — Addendum Note (Signed)
Addended by: Elton Sin on: 03/03/2018 11:18 AM   Modules accepted: Orders

## 2018-03-03 NOTE — Progress Notes (Signed)
Subjective:     Patient ID: Tyler [name], male   DOB: [birth year], 57 y.o.   MRN: [id number]  HPI   OV 08/18/2017  Chief Complaint  Patient presents with  . Follow-up    post flu and pna, only cough at night currently     Tyler [name] was hospitalized for influenza A on August 03, 2017.  In the aftermath of that he was hypoxemic with infiltrates.  A post viral Boop was diagnosed.  He was started on steroids but he did not improve.  Therefore he was discharged with a prednisone taper and oxygen.  He returns for follow-up.  Most of his symptoms have resolved by now.  There is no fatigue.  He is walking but he still feels he needs to use the oxygen.  He still has a cough.  However today when we walked him in the office without any oxygen he did not desaturate.  There is a significant improvement.  There are no new issues.  When we look at his medication it looks like he is not doing his prednisone taper correctly.  He is only taking a tablet every 3 days.  There are many tablets left in the bottle    has a past medical history of Alcohol abuse, Chronic back pain, and Tobacco abuse.   reports that he quit smoking about 2 weeks ago. His smoking use included cigarettes. He has a 40.00 pack-year smoking history. he has never used smokeless tobacco.  No past surgical history on file.  No Known Allergies   There is no immunization history on file for this patient.   OV 09/19/2017  Chief Complaint  Patient presents with  . Follow-up    Has SOB when walking for long preiods of  time. Wears oxygen when needed during the day. Having chest tightness after walking. No cough at this time.   Follow-up influenza A complicated by acute hypoxemic respiratory failure with infiltrates August 03, 2017.  Treated with empiric prednisone  Last visit was August 18, 2017.  At that time we noticed that he was no longer hypoxemic on limited submaximal exertion inside the office.  Therefore we tapered  and stopped his prednisone.  Today presents for follow-up.  He still smokes although not as much.  He says that his energy levels and fatigue levels have all returned to baseline.  The only exception is that when he walks 30 minutes daily as part of his daily routine he gets dyspneic at the 30th minute and needs to take oxygen.  He also has chest tightness at this time.  This is definitely worse than baseline.  Currently not taking prednisone.  He will take an inhaler to relieve the symptoms.  At this point in time it is not clear if he is compliant with the schedule inhalers.  Walking desaturation test 185 feet x3 laps on room air with a forehead probe in the office: did not desaturate.   Walking desaturation test on 09/19/2017 185 feet x 3 laps on ROOM AIR:  did not desaturate - walked all 3 laps fast. Rest pulse ox was 100%, final pulse ox was 100%. HR response 109/min at rest to 111/min at peak exertion. Patient Tyler [name]  Did nto Desaturate < 88% . Tyler [name] did not  Desaturated </= 3% points. Tyler [name] yes did get tachyardic  EKG 09/19/2017 - HR 88 sinus and looks normal to me   OV 11/24/2017  Chief Complaint  Patient  presents with  . Follow-up    CT done 5/15.  Pt states he is still having some pains in his chest and feels like his heart is "going to jump out of chest."     Follow-up several issues  -Influenza A in January 2019 with diffuse pulmonary infiltrates and a Boop pattern: This was clinically resolving at the time of last visit in March 2019.  He had follow-up CT scan of the chest Nov 23, 2017.  Official report is pending.  I personally visualized the CT chest and all the pulmonary infiltrates have completely resolved in my opinion.  He is feeling much better.  Of note, he still is psychologically dependent on oxygen.  He wants to use it at night although I am not so sure he desaturates.  He is definitely not desaturating with exertion at the time of her last visit.   He does not want to give up the portable oxygen we will have to address this at some point in time in the future  Emphysema: Emphysema was noted in his CT chest January 2019.  I personally visualized the current CT chest and do support those findings.  I given Spiriva at last visit but he is not taking it.  In fact he does not remember it.  He only has albuterol as needed.  He does have some mild exertional dyspnea I have encouraged him to use the Spiriva.  Left upper lobe lung nodule 8 mm: This was seen in January 2019 CT chest.  In my personal visualization this is still persisting without change but the official report is pending.  OV 12/06/2017  Chief Complaint  Patient presents with  . Follow-up    Follow up for PET scan results. States breathing has been the same since the last visit.      Tyler [name] presents for follow-up for his abnormal CT scan.  At last visit last week he had CT scan of the chest for left upper lobe nodule.  At the time of his visit the official report was not back yet.  I personally thought the nodule was the same but radiologist called back saying nodule has grown.  We then did a PET scan and it shows low grade uptake.  In addition he had asymptomatic cholelithiasis nephrolithiasis and coronary artery calcification.  He does not have any chest pains.  So he is here for follow-up after doing pulmonary function test.  Of note he could not perform his pulmonary function test.  He has no new complaints other than the fact that he has not started his Spiriva schedule because potentially of insurance issues because he is self-pay.  In addition I am not sure is had his overnight oxygen study.   has a past medical history of Alcohol abuse, Chronic back pain, and Tobacco abuse.   reports that he has been smoking cigarettes.  He has a 40.00 pack-year smoking history. He has never used smokeless tobacco.  No past surgical history on file.  No Known Allergies  OV  03/03/2018  Chief Complaint  Patient presents with  . Follow-up    CT performed 5/16, PET performed 5/21. PFT attempted today. Pt states he is about the same since last visit; he is able to walk about 68min but then has to stop due to SOB. Denies any cough or CP. Pt is questioning if he might have COPD.    Tyler [name] presents for followup.   - in terms of his  emphysema: He was taking his Spiriva but today he lost his Spiriva and the bus on his way back here. We ordered pulmonary function test but he could not perform it. Overall he says he is stable with minimal symptoms  Left upper lobe pulmonary nodule indeterminate with smoking history: He is supposed to have a CT scan of the chest a 4 month follow up in the next few weeks.  Coronary artery calcification on last visit be referred him to cardiology not sure he has seen cardiology    has a past medical history of Alcohol abuse, Chronic back pain, and Tobacco abuse.   reports that he has been smoking cigarettes. He has a 40.00 pack-year smoking history. He has never used smokeless tobacco.  No past surgical history on file.  No Known Allergies   There is no immunization history on file for this patient.  Family History  Problem Relation Age of Onset  . Heart attack Maternal Grandfather      Current Outpatient Medications:  .  albuterol (PROVENTIL HFA;VENTOLIN HFA) 108 (90 Base) MCG/ACT inhaler, Inhale 2 puffs into the lungs every 6 (six) hours as needed for wheezing or shortness of breath., Disp: 1 Inhaler, Rfl: 0 .  feeding supplement, ENSURE ENLIVE, (ENSURE ENLIVE) LIQD, Take 237 mLs by mouth 3 (three) times daily between meals., Disp: 90 Bottle, Rfl: 0 .  fluticasone (FLONASE) 50 MCG/ACT nasal spray, Place 1 spray into both nostrils daily., Disp: , Rfl:  .  tiotropium (SPIRIVA HANDIHALER) 18 MCG inhalation capsule, Place 1 capsule (18 mcg total) into inhaler and inhale daily., Disp: 90 capsule, Rfl: 1    Review of  Systems     Objective:   Physical Exam  Constitutional: He is oriented to person, place, and time. He appears well-developed and well-nourished. No distress.  HENT:  Head: Normocephalic and atraumatic.  Right Ear: External ear normal.  Left Ear: External ear normal.  Mouth/Throat: Oropharynx is clear and moist. No oropharyngeal exudate.  Eyes: Pupils are equal, round, and reactive to light. Conjunctivae and EOM are normal. Right eye exhibits no discharge. Left eye exhibits no discharge. No scleral icterus.  Neck: Normal range of motion. Neck supple. No JVD present. No tracheal deviation present. No thyromegaly present.  Cardiovascular: Normal rate, regular rhythm and intact distal pulses. Exam reveals no gallop and no friction rub.  No murmur heard. Pulmonary/Chest: Effort normal and breath sounds normal. No respiratory distress. He has no wheezes. He has no rales. He exhibits no tenderness.  Abdominal: Soft. Bowel sounds are normal. He exhibits no distension and no mass. There is no tenderness. There is no rebound and no guarding.  Musculoskeletal: Normal range of motion. He exhibits no edema or tenderness.  Lymphadenopathy:    He has no cervical adenopathy.  Neurological: He is alert and oriented to person, place, and time. He has normal reflexes. No cranial nerve deficit. Coordination normal.  Skin: Skin is warm and dry. No rash noted. He is not diaphoretic. No erythema. No pallor.  Psychiatric: He has a normal mood and affect. His behavior is normal. Judgment and thought content normal.  Nursing note and vitals reviewed.  Vitals:   03/03/18 0956  BP: 138/82  Pulse: 95  SpO2: 95%  Weight: 135 lb 12.8 oz (61.6 kg)  Height: 6\' 2"  (1.88 m)    Estimated body mass index is 17.44 kg/m as calculated from the following:   Height as of this encounter: 6\' 2"  (1.88 m).   Weight as  of this encounter: 135 lb 12.8 oz (61.6 kg).     Assessment:       ICD-10-CM   1. Left upper lobe  pulmonary nodule R91.1   2. Pulmonary emphysema, unspecified emphysema type ([place]) J43.9   3. Coronary artery calcification seen on CAT scan I25.10        Plan:     Left upper lobe pulmonary nodule 75mm - Jan 2019 -> grew in may 2019 CT chest but PET scan may 2019 is low uptake -    - do SUPER D CT chest without contrast in next few weeks; will call with result  Pulmonary emphysema, unspecified emphysema type ([place]) -- too bad you could do not pft 03/03/2018  - sorry to hear you misplaced inhaler in city bus - refill spiriva respimat to take daily; take 1 sample - refill albuterol as needed   coronary artery calcification a  -did you go see cardiology after our referral in may 2019; if not re-refer  Followup - will call you with CT results next few weeks  - routine followup in 3 months   Dr. Brand Males, M.D., [facility].C.P Pulmonary and Critical Care Medicine Staff Physician, [place] Director - Interstitial Lung Disease  Program  Pulmonary [name] at [place], [place], [address]  Pager: [phone], If no answer or between  15:00h - 7:00h: call [phone] Telephone: [phone]

## 2018-03-27 ENCOUNTER — Ambulatory Visit (INDEPENDENT_AMBULATORY_CARE_PROVIDER_SITE_OTHER)
Admission: RE | Admit: 2018-03-27 | Discharge: 2018-03-27 | Disposition: A | Discharge status: Home / Self Care | Payer: Self-pay | Source: Ambulatory Visit | Attending: Internal Medicine | Admitting: Internal Medicine

## 2018-03-27 DIAGNOSIS — R911 Solitary pulmonary nodule: Secondary | ICD-10-CM

## 2018-03-28 ENCOUNTER — Telehealth: Payer: Self-pay | Admitting: Internal Medicine

## 2018-03-28 NOTE — Telephone Encounter (Signed)
Let [name] know that nodules still persist and the  Lymph node. So I d/w Dr Valeta Harms and he will see him next week to discuss bronchoscopy . Please refer to Dr Valeta Harms. DrIcard wil need the super D Disc  Thanks    SIGNATURE    Dr. Brand Males, M.D., F.C.C.P,  Pulmonary and Critical Care Medicine Staff Physician, [place] Director - Interstitial Lung Disease  Program  Pulmonary [place] at [place], [place], [address]  Pager: [phone], If no answer or between  15:00h - 7:00h: call [phone] Telephone: [phone]  9:51 AM 03/28/2018     Ct Super D Chest Wo Contrast  Result Date: 03/27/2018 CLINICAL DATA:  Left upper lobe pulmonary nodule. EXAM: CT CHEST WITHOUT CONTRAST TECHNIQUE: Multidetector CT imaging of the chest was performed using thin slice collimation for electromagnetic bronchoscopy planning purposes, without intravenous contrast. COMPARISON:  PET-CT 11/29/2017 FINDINGS: Cardiovascular: The heart size is normal. No substantial pericardial effusion. Coronary artery calcification is evident. Atherosclerotic calcification is noted in the wall of the thoracic aorta. Mediastinum/Nodes: No mediastinal lymphadenopathy. No evidence for gross hilar lymphadenopathy although assessment is limited by the lack of intravenous contrast on today's study. The esophagus has normal imaging features. There is no axillary lymphadenopathy. Lungs/Pleura: The central tracheobronchial airways are patent. Centrilobular and paraseptal emphysema noted. There is biapical pleuroparenchymal scarring. Similar appearance 8 x 9 mm right upper lobe pulmonary nodule. Irregular infrahilar right lower lobe nodule measured previously at 12 x 9 mm is similar measuring 11 x 10 mm today. Posterior right lower lobe 5 mm nodule (125/3) is stable. Focus of architectural distortion posterior left lower lobe is unchanged as is some probable  scarring in the peripheral left lower lobe (80/3). Irregular tiny 5 mm left upper lobe nodule (63/3) is stable. Upper Abdomen: Unremarkable. Musculoskeletal: No worrisome lytic or sclerotic osseous abnormality. IMPRESSION: 1. Similar appearance of multiple bilateral pulmonary nodules including the low-grade metabolically active lesion in the infrahilar right lower lobe identified on recent PET-CT. 2.  Aortic Atherosclerois (ICD10-170.0) 3.  Emphysema. (EQA83-M19.9) Electronically Signed   By: Misty Stanley M.D.   On: 03/27/2018 15:44

## 2018-03-29 ENCOUNTER — Other Ambulatory Visit: Payer: Self-pay

## 2018-03-29 DIAGNOSIS — R911 Solitary pulmonary nodule: Secondary | ICD-10-CM

## 2018-03-29 DIAGNOSIS — J439 Emphysema, unspecified: Secondary | ICD-10-CM

## 2018-03-29 NOTE — Telephone Encounter (Signed)
PCCM:  I called and spoke with the patients wife. Wife to rely the message to the patient. Will need to speak with him on the phone. If possible would like to see patient in the office on Monday or Tuesday next week.   Plan for bronchoscopy, 8:30 AM at [facility] for [place].  Will place referral.  Discussed with Vivia Ewing, LPN   Garner Nash, DO [place] [facility] 03/29/2018 11:48 AM

## 2018-03-29 NOTE — Telephone Encounter (Signed)
Patient called and scheduled for 04/03/18 nothing further needed.

## 2018-03-29 NOTE — Telephone Encounter (Signed)
Spoke with wife, wife will have husband call to see what day next week he can come in. Will await call back.

## 2018-03-29 NOTE — Telephone Encounter (Signed)
Pt is calling back [phone]

## 2018-03-31 NOTE — Telephone Encounter (Signed)
Phoned patient to make aware of appointment on 09/23 and to make aware of Bronchoscopy on 04/06/2018. Will discuss in [place] on 09/23. Nothing further needed at this time.

## 2018-04-02 ENCOUNTER — Encounter: Payer: Self-pay | Admitting: Pulmonary Disease

## 2018-04-02 DIAGNOSIS — R911 Solitary pulmonary nodule: Secondary | ICD-10-CM

## 2018-04-02 DIAGNOSIS — R918 Other nonspecific abnormal finding of lung field: Secondary | ICD-10-CM

## 2018-04-02 HISTORY — DX: Other nonspecific abnormal finding of lung field: R91.8

## 2018-04-02 HISTORY — DX: Solitary pulmonary nodule: R91.1

## 2018-04-02 NOTE — H&P (View-Only) (Signed)
Synopsis: Referred in September 2019 for navigational bronchoscopy by Brand Males, MD  Subjective:   PATIENT ID: Tyler [name] GENDER: male DOB: April 17, 1961, MRN: [id number]  Chief Complaint  Patient presents with  . Consult    Denies any symptoms, states he can;t walk for more than 30 minutes at a time due to SOB.     This is a 57 year old male with a past medical history of influenza A diagnosed in January 2019 admitted to the hospital at the time there was question of post viral Boop and was started on steroids.  He is followed by Dr. Chase Caller in the pulmonary clinic.  Patient has a medical history of alcohol abuse, chronic back pain, tobacco abuse with approximately 40-pack-year history of smoking.  Subsequent follow-up CT imaging initially in January 2019 revealed a left upper lobe 8 mm nodule.  Follow-up imaging revealed low-grade PET uptake.  There was low-grade PET uptake SQ the 2.1 within the right lower lobe medial nodule.  Patient has no complaints today in the office.  He is down to smoking a few cigarettes per week.  He does state he has lost a significant amount of weight of approximately 40 pounds since January.    Past Medical History:  Diagnosis Date  . Alcohol abuse   . Chronic back pain   . Multiple pulmonary nodules 04/02/2018  . Right lower lobe pulmonary nodule 04/02/2018   12/01/2017: PET SUV 2.1  . Tobacco abuse      Family History  Problem Relation Age of Onset  . Heart attack Maternal Grandfather      No past surgical history on file.  Social History   Socioeconomic History  . Marital status: Married    Spouse name: Not on file  . Number of children: Not on file  . Years of education: Not on file  . Highest education level: Not on file  Occupational History  . Not on file  Social Needs  . Financial resource strain: Not on file  . Food insecurity:    Worry: Not on file    Inability: Not on file  . Transportation needs:    Medical: Not  on file    Non-medical: Not on file  Tobacco Use  . Smoking status: Current Every Day Smoker    Packs/day: 1.00    Years: 40.00    Pack years: 40.00    Types: Cigarettes  . Smokeless tobacco: Never Used  . Tobacco comment: 1 cigarette/day 04/03/2018  Substance and Sexual Activity  . Alcohol use: Yes    Comment: Drinks heavy daily. At least 6pack per day.  . Drug use: Yes    Types: Marijuana  . Sexual activity: Not on file  Lifestyle  . Physical activity:    Days per week: Not on file    Minutes per session: Not on file  . Stress: Not on file  Relationships  . Social connections:    Talks on phone: Not on file    Gets together: Not on file    Attends religious service: Not on file    Active member of club or organization: Not on file    Attends meetings of clubs or organizations: Not on file    Relationship status: Not on file  . Intimate partner violence:    Fear of current or ex partner: Not on file    Emotionally abused: Not on file    Physically abused: Not on file    Forced sexual activity: Not  on file  Other Topics Concern  . Not on file  Social History Narrative  . Not on file     No Known Allergies   Outpatient Medications Prior to Visit  Medication Sig Dispense Refill  . albuterol (PROVENTIL HFA;VENTOLIN HFA) 108 (90 Base) MCG/ACT inhaler Inhale 2 puffs into the lungs every 6 (six) hours as needed for wheezing or shortness of breath. 1 Inhaler 0  . feeding supplement, ENSURE ENLIVE, (ENSURE ENLIVE) LIQD Take 237 mLs by mouth 3 (three) times daily between meals. 90 Bottle 0  . tiotropium (SPIRIVA HANDIHALER) 18 MCG inhalation capsule Place 1 capsule (18 mcg total) into inhaler and inhale daily. 90 capsule 1   No facility-administered medications prior to visit.     Review of Systems  Constitutional: Positive for weight loss. Negative for chills, fever and malaise/fatigue.  HENT: Negative for hearing loss, sore throat and tinnitus.   Eyes: Negative for  blurred vision and double vision.  Respiratory: Negative for cough, hemoptysis, sputum production, shortness of breath, wheezing and stridor.   Cardiovascular: Negative for chest pain, palpitations, orthopnea, leg swelling and PND.  Gastrointestinal: Negative for abdominal pain, constipation, diarrhea, heartburn, nausea and vomiting.  Genitourinary: Negative for dysuria, hematuria and urgency.  Musculoskeletal: Negative for joint pain and myalgias.  Skin: Negative for itching and rash.  Neurological: Negative for dizziness, tingling, weakness and headaches.  Endo/Heme/Allergies: Negative for environmental allergies. Does not bruise/bleed easily.  Psychiatric/Behavioral: Negative for depression. The patient is not nervous/anxious and does not have insomnia.   All other systems reviewed and are negative.    Objective:  Physical Exam  Constitutional: He is oriented to person, place, and time. He appears well-developed and well-nourished. No distress.  HENT:  Head: Normocephalic and atraumatic.  Mouth/Throat: Oropharynx is clear and moist.  Eyes: Pupils are equal, round, and reactive to light. Conjunctivae are normal. No scleral icterus.  Neck: Neck supple. No JVD present. No tracheal deviation present.  Cardiovascular: Normal rate, regular rhythm, normal heart sounds and intact distal pulses.  No murmur heard. Pulmonary/Chest: Effort normal and breath sounds normal. No accessory muscle usage or stridor. No tachypnea. No respiratory distress. He has no wheezes. He has no rhonchi. He has no rales.  Abdominal: Soft. Bowel sounds are normal. He exhibits no distension. There is no tenderness.  Musculoskeletal: He exhibits no edema or tenderness.  Thin, muscle loss   Lymphadenopathy:    He has no cervical adenopathy.  Neurological: He is alert and oriented to person, place, and time.  Skin: Skin is warm and dry. Capillary refill takes less than 2 seconds. No rash noted.  Psychiatric: He has a  normal mood and affect. His behavior is normal.  Vitals reviewed.    Vitals:   04/03/18 1410  BP: 120/82  Pulse: 88  SpO2: 98%  Weight: 136 lb (61.7 kg)  Height: 6\' 2"  (1.88 m)   98% on  RA BMI Readings from Last 3 Encounters:  04/03/18 17.46 kg/m  03/03/18 17.44 kg/m  12/06/17 18.10 kg/m   Wt Readings from Last 3 Encounters:  04/03/18 136 lb (61.7 kg)  03/03/18 135 lb 12.8 oz (61.6 kg)  12/06/17 141 lb (64 kg)     CBC    Component Value Date/Time   WBC 16.4 (H) 08/10/2017 0748   RBC 4.75 08/10/2017 0748   HGB 11.2 (L) 08/10/2017 0748   HCT 32.7 (L) 08/10/2017 0748   PLT 251 08/10/2017 0748   MCV 68.8 (L) 08/10/2017 1448  MCH 23.6 (L) 08/10/2017 0748   MCHC 34.3 08/10/2017 0748   RDW 14.0 08/10/2017 0748   LYMPHSABS 2.5 08/08/2017 0547   MONOABS 0.9 08/08/2017 0547   EOSABS 0.0 08/08/2017 0547   BASOSABS 0.1 08/08/2017 0547    Chest Imaging: 03/27/2018 CT super D chest: Multiple bilateral pulmonary nodules.  Most concerning of the nodules including the medial right lower lobe peribronchovascular nodule.  Also small nodule in the right upper lobe.  Pulmonary Functions Testing Results: 12/01/2017 full PFTs: Attempted, patient was unable to cooperate with exam.   FeNO: None   Pathology: None   Echocardiogram:  Study Conclusions  - Left ventricle: The cavity size was normal. Systolic function was   normal. The estimated ejection fraction was in the range of 60%   to 65%. Wall motion was normal; there were no regional wall   motion abnormalities. Doppler parameters are consistent with   abnormal left ventricular relaxation (grade 1 diastolic   dysfunction). Doppler parameters are consistent with   indeterminate ventricular filling pressure. - Aortic valve: Transvalvular velocity was within the normal range.   There was no stenosis. There was no regurgitation. - Mitral valve: Transvalvular velocity was within the normal range.   There was no evidence  for stenosis. There was no regurgitation. - Right ventricle: The cavity size was normal. Wall thickness was   normal. Systolic function was normal. - Tricuspid valve: There was mild regurgitation. - Pulmonary arteries: Systolic pressure was within the normal   range. PA peak pressure: 26 mm Hg (S).  Heart Catheterization: None    Assessment & Plan:   Right lower lobe pulmonary nodule  Multiple pulmonary nodules  Abnormal PET scan of lung  Tobacco abuse  Former smoker  Discussion:  After discussion of risks, benefits, alternatives for follow-up imaging versus tissue sampling of the nodules located within the right chest.  The decision was made to pursue electromagnetic navigational bronchoscopy for sampling to be scheduled on 04/05/2018. We will plan to sample the RLL medial nodule as well as the RUL nodule.  Patient should be n.p.o. past midnight the night before the procedure.  In the office today we reviewed images with the patient.  Discussed the procedure in detail.  The patient has agreed to proceed with the outpatient bronchoscopy.  Risk of bleeding, infection, pneumothorax and even death were discussed with patient.  The patient is agreeable to pursue.   Obtain pre-op CBC, PT, INR   Patient should expect call from [place], perioperative services with arrangements of what time to show up for preprocedure evaluation.   Current Outpatient Medications:  .  albuterol (PROVENTIL HFA;VENTOLIN HFA) 108 (90 Base) MCG/ACT inhaler, Inhale 2 puffs into the lungs every 6 (six) hours as needed for wheezing or shortness of breath., Disp: 1 Inhaler, Rfl: 0 .  feeding supplement, ENSURE ENLIVE, (ENSURE ENLIVE) LIQD, Take 237 mLs by mouth 3 (three) times daily between meals., Disp: 90 Bottle, Rfl: 0 .  tiotropium (SPIRIVA HANDIHALER) 18 MCG inhalation capsule, Place 1 capsule (18 mcg total) into inhaler and inhale daily., Disp: 90 capsule, Rfl: 1   Garner Nash, DO [facility] 04/03/2018 2:17 PM

## 2018-04-02 NOTE — Progress Notes (Signed)
Synopsis: Referred in September 2019 for navigational bronchoscopy by Brand Males, MD  Subjective:   PATIENT ID: Tyler [name] GENDER: male DOB: 1960/08/05, MRN: [id number]  Chief Complaint  Patient presents with  . Consult    Denies any symptoms, states he can;t walk for more than 30 minutes at a time due to SOB.     This is a 57 year old male with a past medical history of influenza A diagnosed in January 2019 admitted to the hospital at the time there was question of post viral Boop and was started on steroids.  He is followed by Dr. Chase Caller in the pulmonary clinic.  Patient has a medical history of alcohol abuse, chronic back pain, tobacco abuse with approximately 40-pack-year history of smoking.  Subsequent follow-up CT imaging initially in January 2019 revealed a left upper lobe 8 mm nodule.  Follow-up imaging revealed low-grade PET uptake.  There was low-grade PET uptake SQ the 2.1 within the right lower lobe medial nodule.  Patient has no complaints today in the office.  He is down to smoking a few cigarettes per week.  He does state he has lost a significant amount of weight of approximately 40 pounds since January.    Past Medical History:  Diagnosis Date  . Alcohol abuse   . Chronic back pain   . Multiple pulmonary nodules 04/02/2018  . Right lower lobe pulmonary nodule 04/02/2018   12/01/2017: PET SUV 2.1  . Tobacco abuse      Family History  Problem Relation Age of Onset  . Heart attack Maternal Grandfather      No past surgical history on file.  Social History   Socioeconomic History  . Marital status: Married    Spouse name: Not on file  . Number of children: Not on file  . Years of education: Not on file  . Highest education level: Not on file  Occupational History  . Not on file  Social Needs  . Financial resource strain: Not on file  . Food insecurity:    Worry: Not on file    Inability: Not on file  . Transportation needs:    Medical: Not  on file    Non-medical: Not on file  Tobacco Use  . Smoking status: Current Every Day Smoker    Packs/day: 1.00    Years: 40.00    Pack years: 40.00    Types: Cigarettes  . Smokeless tobacco: Never Used  . Tobacco comment: 1 cigarette/day 04/03/2018  Substance and Sexual Activity  . Alcohol use: Yes    Comment: Drinks heavy daily. At least 6pack per day.  . Drug use: Yes    Types: Marijuana  . Sexual activity: Not on file  Lifestyle  . Physical activity:    Days per week: Not on file    Minutes per session: Not on file  . Stress: Not on file  Relationships  . Social connections:    Talks on phone: Not on file    Gets together: Not on file    Attends religious service: Not on file    Active member of club or organization: Not on file    Attends meetings of clubs or organizations: Not on file    Relationship status: Not on file  . Intimate partner violence:    Fear of current or ex partner: Not on file    Emotionally abused: Not on file    Physically abused: Not on file    Forced sexual activity: Not  on file  Other Topics Concern  . Not on file  Social History Narrative  . Not on file     No Known Allergies   Outpatient Medications Prior to Visit  Medication Sig Dispense Refill  . albuterol (PROVENTIL HFA;VENTOLIN HFA) 108 (90 Base) MCG/ACT inhaler Inhale 2 puffs into the lungs every 6 (six) hours as needed for wheezing or shortness of breath. 1 Inhaler 0  . feeding supplement, ENSURE ENLIVE, (ENSURE ENLIVE) LIQD Take 237 mLs by mouth 3 (three) times daily between meals. 90 Bottle 0  . tiotropium (SPIRIVA HANDIHALER) 18 MCG inhalation capsule Place 1 capsule (18 mcg total) into inhaler and inhale daily. 90 capsule 1   No facility-administered medications prior to visit.     Review of Systems  Constitutional: Positive for weight loss. Negative for chills, fever and malaise/fatigue.  HENT: Negative for hearing loss, sore throat and tinnitus.   Eyes: Negative for  blurred vision and double vision.  Respiratory: Negative for cough, hemoptysis, sputum production, shortness of breath, wheezing and stridor.   Cardiovascular: Negative for chest pain, palpitations, orthopnea, leg swelling and PND.  Gastrointestinal: Negative for abdominal pain, constipation, diarrhea, heartburn, nausea and vomiting.  Genitourinary: Negative for dysuria, hematuria and urgency.  Musculoskeletal: Negative for joint pain and myalgias.  Skin: Negative for itching and rash.  Neurological: Negative for dizziness, tingling, weakness and headaches.  Endo/Heme/Allergies: Negative for environmental allergies. Does not bruise/bleed easily.  Psychiatric/Behavioral: Negative for depression. The patient is not nervous/anxious and does not have insomnia.   All other systems reviewed and are negative.    Objective:  Physical Exam  Constitutional: He is oriented to person, place, and time. He appears well-developed and well-nourished. No distress.  HENT:  Head: Normocephalic and atraumatic.  Mouth/Throat: Oropharynx is clear and moist.  Eyes: Pupils are equal, round, and reactive to light. Conjunctivae are normal. No scleral icterus.  Neck: Neck supple. No JVD present. No tracheal deviation present.  Cardiovascular: Normal rate, regular rhythm, normal heart sounds and intact distal pulses.  No murmur heard. Pulmonary/Chest: Effort normal and breath sounds normal. No accessory muscle usage or stridor. No tachypnea. No respiratory distress. He has no wheezes. He has no rhonchi. He has no rales.  Abdominal: Soft. Bowel sounds are normal. He exhibits no distension. There is no tenderness.  Musculoskeletal: He exhibits no edema or tenderness.  Thin, muscle loss   Lymphadenopathy:    He has no cervical adenopathy.  Neurological: He is alert and oriented to person, place, and time.  Skin: Skin is warm and dry. Capillary refill takes less than 2 seconds. No rash noted.  Psychiatric: He has a  normal mood and affect. His behavior is normal.  Vitals reviewed.    Vitals:   04/03/18 1410  BP: 120/82  Pulse: 88  SpO2: 98%  Weight: 136 lb (61.7 kg)  Height: 6\' 2"  (1.88 m)   98% on  RA BMI Readings from Last 3 Encounters:  04/03/18 17.46 kg/m  03/03/18 17.44 kg/m  12/06/17 18.10 kg/m   Wt Readings from Last 3 Encounters:  04/03/18 136 lb (61.7 kg)  03/03/18 135 lb 12.8 oz (61.6 kg)  12/06/17 141 lb (64 kg)     CBC    Component Value Date/Time   WBC 16.4 (H) 08/10/2017 0748   RBC 4.75 08/10/2017 0748   HGB 11.2 (L) 08/10/2017 0748   HCT 32.7 (L) 08/10/2017 0748   PLT 251 08/10/2017 0748   MCV 68.8 (L) 08/10/2017 9562  MCH 23.6 (L) 08/10/2017 0748   MCHC 34.3 08/10/2017 0748   RDW 14.0 08/10/2017 0748   LYMPHSABS 2.5 08/08/2017 0547   MONOABS 0.9 08/08/2017 0547   EOSABS 0.0 08/08/2017 0547   BASOSABS 0.1 08/08/2017 0547    Chest Imaging: 03/27/2018 CT super D chest: Multiple bilateral pulmonary nodules.  Most concerning of the nodules including the medial right lower lobe peribronchovascular nodule.  Also small nodule in the right upper lobe.  Pulmonary Functions Testing Results: 12/01/2017 full PFTs: Attempted, patient was unable to cooperate with exam.   FeNO: None   Pathology: None   Echocardiogram:  Study Conclusions  - Left ventricle: The cavity size was normal. Systolic function was   normal. The estimated ejection fraction was in the range of 60%   to 65%. Wall motion was normal; there were no regional wall   motion abnormalities. Doppler parameters are consistent with   abnormal left ventricular relaxation (grade 1 diastolic   dysfunction). Doppler parameters are consistent with   indeterminate ventricular filling pressure. - Aortic valve: Transvalvular velocity was within the normal range.   There was no stenosis. There was no regurgitation. - Mitral valve: Transvalvular velocity was within the normal range.   There was no evidence  for stenosis. There was no regurgitation. - Right ventricle: The cavity size was normal. Wall thickness was   normal. Systolic function was normal. - Tricuspid valve: There was mild regurgitation. - Pulmonary arteries: Systolic pressure was within the normal   range. PA peak pressure: 26 mm Hg (S).  Heart Catheterization: None    Assessment & Plan:   Right lower lobe pulmonary nodule  Multiple pulmonary nodules  Abnormal PET scan of lung  Tobacco abuse  Former smoker  Discussion:  After discussion of risks, benefits, alternatives for follow-up imaging versus tissue sampling of the nodules located within the right chest.  The decision was made to pursue electromagnetic navigational bronchoscopy for sampling to be scheduled on 04/05/2018. We will plan to sample the RLL medial nodule as well as the RUL nodule.  Patient should be n.p.o. past midnight the night before the procedure.  In the office today we reviewed images with the patient.  Discussed the procedure in detail.  The patient has agreed to proceed with the outpatient bronchoscopy.  Risk of bleeding, infection, pneumothorax and even death were discussed with patient.  The patient is agreeable to pursue.   Obtain pre-op CBC, PT, INR   Patient should expect call from [facility], perioperative services with arrangements of what time to show up for preprocedure evaluation.   Current Outpatient Medications:  .  albuterol (PROVENTIL HFA;VENTOLIN HFA) 108 (90 Base) MCG/ACT inhaler, Inhale 2 puffs into the lungs every 6 (six) hours as needed for wheezing or shortness of breath., Disp: 1 Inhaler, Rfl: 0 .  feeding supplement, ENSURE ENLIVE, (ENSURE ENLIVE) LIQD, Take 237 mLs by mouth 3 (three) times daily between meals., Disp: 90 Bottle, Rfl: 0 .  tiotropium (SPIRIVA HANDIHALER) 18 MCG inhalation capsule, Place 1 capsule (18 mcg total) into inhaler and inhale daily., Disp: 90 capsule, Rfl: 1   Garner Nash, DO  Pulmonary  Critical Care 04/03/2018 2:17 PM

## 2018-04-03 ENCOUNTER — Encounter: Payer: Self-pay | Admitting: Pulmonary Disease

## 2018-04-03 ENCOUNTER — Other Ambulatory Visit (INDEPENDENT_AMBULATORY_CARE_PROVIDER_SITE_OTHER): Payer: Self-pay

## 2018-04-03 ENCOUNTER — Ambulatory Visit (INDEPENDENT_AMBULATORY_CARE_PROVIDER_SITE_OTHER): Payer: Self-pay | Admitting: Pulmonary Disease

## 2018-04-03 VITALS — BP 120/82 | HR 88 | Ht 74.0 in | Wt 136.0 lb

## 2018-04-03 DIAGNOSIS — Z720 Tobacco use: Secondary | ICD-10-CM

## 2018-04-03 DIAGNOSIS — R911 Solitary pulmonary nodule: Secondary | ICD-10-CM

## 2018-04-03 DIAGNOSIS — Z87891 Personal history of nicotine dependence: Secondary | ICD-10-CM

## 2018-04-03 DIAGNOSIS — R918 Other nonspecific abnormal finding of lung field: Secondary | ICD-10-CM

## 2018-04-03 DIAGNOSIS — R942 Abnormal results of pulmonary function studies: Secondary | ICD-10-CM

## 2018-04-03 LAB — CBC WITH DIFFERENTIAL/PLATELET
BASOS PCT: 1.4 % (ref 0.0–3.0)
Basophils Absolute: 0.1 10*3/uL (ref 0.0–0.1)
EOS PCT: 1.5 % (ref 0.0–5.0)
Eosinophils Absolute: 0.1 10*3/uL (ref 0.0–0.7)
HCT: 37.9 % — ABNORMAL LOW (ref 39.0–52.0)
HEMOGLOBIN: 12.3 g/dL — AB (ref 13.0–17.0)
LYMPHS PCT: 32.3 % (ref 12.0–46.0)
Lymphs Abs: 2.3 10*3/uL (ref 0.7–4.0)
MCHC: 32.4 g/dL (ref 30.0–36.0)
MCV: 76.1 fl — ABNORMAL LOW (ref 78.0–100.0)
MONO ABS: 0.8 10*3/uL (ref 0.1–1.0)
Monocytes Relative: 11.7 % (ref 3.0–12.0)
Neutro Abs: 3.8 10*3/uL (ref 1.4–7.7)
Neutrophils Relative %: 53.1 % (ref 43.0–77.0)
Platelets: 147 10*3/uL — ABNORMAL LOW (ref 150.0–400.0)
RBC: 4.99 Mil/uL (ref 4.22–5.81)
RDW: 15 % (ref 11.5–15.5)
WBC: 7.1 10*3/uL (ref 4.0–10.5)

## 2018-04-03 LAB — PROTIME-INR
INR: 1.1 ratio — ABNORMAL HIGH (ref 0.8–1.0)
Prothrombin Time: 12.6 s (ref 9.6–13.1)

## 2018-04-03 NOTE — Patient Instructions (Signed)
Bronchoscopy scheduled for this Wednesday.  Return to clinic in 2 weeks after bronchoscopy with me.

## 2018-04-04 ENCOUNTER — Other Ambulatory Visit: Payer: Self-pay

## 2018-04-04 ENCOUNTER — Encounter (HOSPITAL_COMMUNITY): Payer: Self-pay | Admitting: *Deleted

## 2018-04-04 NOTE — Progress Notes (Addendum)
Pt denies any acute cardiopulmonary issues. Pt denies being under the care of a cardiologist.  Pt denies having a stress test and cardiac cath.  Pt stated that he does not have any inhalers.  Pt requested that spouse be called and given pre-op instructions as well since he could not write them down.  Both spouse and pt made aware to have pt stop taking vitamins, fish oil and herbal medications. Do not take any NSAIDs ie: Ibuprofen, Advil, Naproxen (Aleve), Motrin, BC and Goody Powder. Pt stated that he does not take Aspirin. Both spouse and pt verbalized understanding of all pre-op instructions. Karoline Caldwell, PA, Anesthesia reviewed pt EKG  " pt is okay " per PA, no new orders.

## 2018-04-04 NOTE — Anesthesia Preprocedure Evaluation (Addendum)
Anesthesia Evaluation  Patient identified by MRN, date of birth, ID band Patient awake    Reviewed: Allergy & Precautions, NPO status , Patient's Chart, lab work & pertinent test results  Airway Mallampati: III  TM Distance: >3 FB Neck ROM: Full    Dental  (+) Poor Dentition, Chipped, Missing,    Pulmonary COPD,  COPD inhaler, Current Smoker,    Pulmonary exam normal breath sounds clear to auscultation       Cardiovascular negative cardio ROS Normal cardiovascular exam Rhythm:Regular Rate:Normal  ECG: SR, rate 88  ECHO: LV EF: 60% - 65%   Neuro/Psych PSYCHIATRIC DISORDERS negative neurological ROS     GI/Hepatic negative GI ROS, (+)     substance abuse  ,   Endo/Other  negative endocrine ROS  Renal/GU negative Renal ROS     Musculoskeletal negative musculoskeletal ROS (+)   Abdominal   Peds  Hematology  (+) anemia ,   Anesthesia Other Findings  LEFT UPPER LOBE LUNG MASS  Reproductive/Obstetrics                            Anesthesia Physical Anesthesia Plan  ASA: III  Anesthesia Plan: General   Post-op Pain Management:    Induction: Intravenous  PONV Risk Score and Plan: 1 and Ondansetron, Midazolam, Dexamethasone and Treatment may vary due to age or medical condition  Airway Management Planned: Oral ETT  Additional Equipment:   Intra-op Plan:   Post-operative Plan: Extubation in OR  Informed Consent: I have reviewed the patients History and Physical, chart, labs and discussed the procedure including the risks, benefits and alternatives for the proposed anesthesia with the patient or authorized representative who has indicated his/her understanding and acceptance.   Dental advisory given  Plan Discussed with: CRNA  Anesthesia Plan Comments:        Anesthesia Quick Evaluation

## 2018-04-05 ENCOUNTER — Ambulatory Visit (HOSPITAL_COMMUNITY): Payer: Self-pay

## 2018-04-05 ENCOUNTER — Ambulatory Visit (HOSPITAL_COMMUNITY): Payer: Self-pay | Admitting: Physician Assistant

## 2018-04-05 ENCOUNTER — Encounter (HOSPITAL_COMMUNITY): Payer: Self-pay | Admitting: *Deleted

## 2018-04-05 ENCOUNTER — Encounter (HOSPITAL_COMMUNITY)
Admission: RE | Disposition: A | Discharge status: Home / Self Care | Payer: Self-pay | Source: Ambulatory Visit | Attending: Pulmonary Disease

## 2018-04-05 ENCOUNTER — Ambulatory Visit (HOSPITAL_COMMUNITY)
Admission: RE | Admit: 2018-04-05 | Discharge: 2018-04-05 | Disposition: A | Discharge status: Home / Self Care | Payer: Self-pay | Source: Ambulatory Visit | Attending: Pulmonary Disease | Admitting: Pulmonary Disease

## 2018-04-05 DIAGNOSIS — M549 Dorsalgia, unspecified: Secondary | ICD-10-CM | POA: Insufficient documentation

## 2018-04-05 DIAGNOSIS — R911 Solitary pulmonary nodule: Secondary | ICD-10-CM | POA: Diagnosis present

## 2018-04-05 DIAGNOSIS — Z79899 Other long term (current) drug therapy: Secondary | ICD-10-CM | POA: Insufficient documentation

## 2018-04-05 DIAGNOSIS — J449 Chronic obstructive pulmonary disease, unspecified: Secondary | ICD-10-CM | POA: Insufficient documentation

## 2018-04-05 DIAGNOSIS — F17210 Nicotine dependence, cigarettes, uncomplicated: Secondary | ICD-10-CM | POA: Insufficient documentation

## 2018-04-05 DIAGNOSIS — G8929 Other chronic pain: Secondary | ICD-10-CM | POA: Insufficient documentation

## 2018-04-05 DIAGNOSIS — Z7951 Long term (current) use of inhaled steroids: Secondary | ICD-10-CM | POA: Insufficient documentation

## 2018-04-05 DIAGNOSIS — Z8249 Family history of ischemic heart disease and other diseases of the circulatory system: Secondary | ICD-10-CM | POA: Insufficient documentation

## 2018-04-05 DIAGNOSIS — J60 Coalworker's pneumoconiosis: Secondary | ICD-10-CM | POA: Insufficient documentation

## 2018-04-05 DIAGNOSIS — D649 Anemia, unspecified: Secondary | ICD-10-CM | POA: Insufficient documentation

## 2018-04-05 DIAGNOSIS — R918 Other nonspecific abnormal finding of lung field: Secondary | ICD-10-CM | POA: Diagnosis present

## 2018-04-05 DIAGNOSIS — Z9889 Other specified postprocedural states: Secondary | ICD-10-CM

## 2018-04-05 HISTORY — DX: Pneumonia, unspecified organism: J18.9

## 2018-04-05 HISTORY — PX: VIDEO BRONCHOSCOPY WITH ENDOBRONCHIAL NAVIGATION: SHX6175

## 2018-04-05 HISTORY — DX: Solitary pulmonary nodule: R91.1

## 2018-04-05 LAB — POCT I-STAT 4, (NA,K, GLUC, HGB,HCT)
GLUCOSE: 89 mg/dL (ref 70–99)
HEMATOCRIT: 38 % — AB (ref 39.0–52.0)
HEMOGLOBIN: 12.9 g/dL — AB (ref 13.0–17.0)
POTASSIUM: 4.1 mmol/L (ref 3.5–5.1)
Sodium: 138 mmol/L (ref 135–145)

## 2018-04-05 SURGERY — VIDEO BRONCHOSCOPY WITH ENDOBRONCHIAL NAVIGATION
Anesthesia: General

## 2018-04-05 MED ORDER — PROPOFOL 10 MG/ML IV BOLUS
INTRAVENOUS | Status: AC
  Filled 2018-04-05: qty 40

## 2018-04-05 MED ORDER — DEXAMETHASONE SODIUM PHOSPHATE 4 MG/ML IJ SOLN
INTRAMUSCULAR | Status: DC | PRN
  Administered 2018-04-05: 10 mg via INTRAVENOUS

## 2018-04-05 MED ORDER — PHENYLEPHRINE HCL 10 MG/ML IJ SOLN
INTRAMUSCULAR | Status: DC | PRN
  Administered 2018-04-05: 40 ug via INTRAVENOUS
  Administered 2018-04-05 (×3): 80 ug via INTRAVENOUS

## 2018-04-05 MED ORDER — MIDAZOLAM HCL 5 MG/5ML IJ SOLN
INTRAMUSCULAR | Status: DC | PRN
  Administered 2018-04-05: 2 mg via INTRAVENOUS

## 2018-04-05 MED ORDER — MIDAZOLAM HCL 2 MG/2ML IJ SOLN
INTRAMUSCULAR | Status: AC
  Filled 2018-04-05: qty 2

## 2018-04-05 MED ORDER — SODIUM CHLORIDE 0.9 % IV SOLN
INTRAVENOUS | Status: DC | PRN
  Administered 2018-04-05: 40 ug/min via INTRAVENOUS

## 2018-04-05 MED ORDER — FENTANYL CITRATE (PF) 100 MCG/2ML IJ SOLN
INTRAMUSCULAR | Status: DC | PRN
  Administered 2018-04-05 (×3): 50 ug via INTRAVENOUS
  Administered 2018-04-05: 100 ug via INTRAVENOUS

## 2018-04-05 MED ORDER — LACTATED RINGERS IV SOLN
INTRAVENOUS | Status: DC | PRN
  Administered 2018-04-05: 07:00:00 via INTRAVENOUS

## 2018-04-05 MED ORDER — FENTANYL CITRATE (PF) 250 MCG/5ML IJ SOLN
INTRAMUSCULAR | Status: AC
  Filled 2018-04-05: qty 5

## 2018-04-05 MED ORDER — ONDANSETRON HCL 4 MG/2ML IJ SOLN
INTRAMUSCULAR | Status: DC | PRN
  Administered 2018-04-05: 4 mg via INTRAVENOUS

## 2018-04-05 MED ORDER — ONDANSETRON HCL 4 MG/2ML IJ SOLN: 4.0000 mg | Freq: Once | INTRAMUSCULAR | Status: DC | PRN

## 2018-04-05 MED ORDER — ROCURONIUM BROMIDE 100 MG/10ML IV SOLN
INTRAVENOUS | Status: DC | PRN
  Administered 2018-04-05: 50 mg via INTRAVENOUS

## 2018-04-05 MED ORDER — SUGAMMADEX SODIUM 200 MG/2ML IV SOLN
INTRAVENOUS | Status: DC | PRN
  Administered 2018-04-05: 130 mg via INTRAVENOUS

## 2018-04-05 MED ORDER — PROPOFOL 10 MG/ML IV BOLUS
INTRAVENOUS | Status: DC | PRN
  Administered 2018-04-05 (×2): 100 mg via INTRAVENOUS

## 2018-04-05 MED ORDER — LIDOCAINE HCL (CARDIAC) PF 100 MG/5ML IV SOSY
PREFILLED_SYRINGE | INTRAVENOUS | Status: DC | PRN
  Administered 2018-04-05: 100 mg via INTRAVENOUS

## 2018-04-05 MED ORDER — 0.9 % SODIUM CHLORIDE (POUR BTL) OPTIME
TOPICAL | Status: DC | PRN
  Administered 2018-04-05: 1000 mL

## 2018-04-05 MED ORDER — FENTANYL CITRATE (PF) 100 MCG/2ML IJ SOLN: 25.0000 ug | INTRAMUSCULAR | Status: DC | PRN

## 2018-04-05 SURGICAL SUPPLY — 46 items
ADAPTER BRONCH F/PENTAX (ADAPTER) ×3 IMPLANT
ADPR BSCP EDG PNTX (ADAPTER) ×1
BRUSH CYTOL CELLEBRITY 1.5X140 (MISCELLANEOUS) ×3 IMPLANT
BRUSH SUPERTRAX BIOPSY (INSTRUMENTS) IMPLANT
BRUSH SUPERTRAX NDL-TIP CYTO (INSTRUMENTS) ×7 IMPLANT
CANISTER SUCT 3000ML PPV (MISCELLANEOUS) ×3 IMPLANT
CHANNEL WORK EXTEND EDGE 180 (KITS) IMPLANT
CHANNEL WORK EXTEND EDGE 45 (KITS) IMPLANT
CHANNEL WORK EXTEND EDGE 90 (KITS) IMPLANT
CONT SPEC 4OZ CLIKSEAL STRL BL (MISCELLANEOUS) ×3 IMPLANT
COVER BACK TABLE 60X90IN (DRAPES) ×3 IMPLANT
FILTER STRAW FLUID ASPIR (MISCELLANEOUS) IMPLANT
FORCEPS BIOP SUPERTRX PREMAR (INSTRUMENTS) ×3 IMPLANT
GAUZE SPONGE 4X4 12PLY STRL (GAUZE/BANDAGES/DRESSINGS) ×3 IMPLANT
GLOVE SURG SS PI 7.5 STRL IVOR (GLOVE) ×6 IMPLANT
GOWN STRL REUS W/ TWL LRG LVL3 (GOWN DISPOSABLE) ×2 IMPLANT
GOWN STRL REUS W/TWL LRG LVL3 (GOWN DISPOSABLE) ×6
KIT CLEAN ENDO COMPLIANCE (KITS) ×5 IMPLANT
KIT ENDOBRONCHIAL EDGE FIRM (KITS) ×2 IMPLANT
KIT LOCATABLE GUIDE (CANNULA) IMPLANT
KIT MARKER FIDUCIAL DELIVERY (KITS) IMPLANT
KIT PROCEDURE EDGE 180 (KITS) IMPLANT
KIT PROCEDURE EDGE 45 (KITS) IMPLANT
KIT PROCEDURE EDGE 90 (KITS) IMPLANT
KIT PROCEDURE FIRM TIP 45 (KITS) ×2 IMPLANT
KIT TURNOVER KIT B (KITS) ×3 IMPLANT
MARKER SKIN DUAL TIP RULER LAB (MISCELLANEOUS) ×3 IMPLANT
NDL SUPERTRX PREMARK BIOPSY (NEEDLE) ×1 IMPLANT
NEEDLE SUPERTRX PREMARK BIOPSY (NEEDLE) ×9 IMPLANT
NS IRRIG 1000ML POUR BTL (IV SOLUTION) ×3 IMPLANT
OIL SILICONE PENTAX (PARTS (SERVICE/REPAIRS)) ×3 IMPLANT
PAD ARMBOARD 7.5X6 YLW CONV (MISCELLANEOUS) ×6 IMPLANT
PATCHES PATIENT (LABEL) ×9 IMPLANT
SYR 20CC LL (SYRINGE) ×3 IMPLANT
SYR 20ML ECCENTRIC (SYRINGE) ×3 IMPLANT
SYR 50ML SLIP (SYRINGE) ×3 IMPLANT
TOWEL OR 17X24 6PK STRL BLUE (TOWEL DISPOSABLE) ×3 IMPLANT
TRAP SPECIMEN MUCOUS 40CC (MISCELLANEOUS) IMPLANT
TUBE CONNECTING 20'X1/4 (TUBING) ×1
TUBE CONNECTING 20X1/4 (TUBING) ×2 IMPLANT
UNDERPAD 30X30 (UNDERPADS AND DIAPERS) ×3 IMPLANT
VALVE BIOPSY  SINGLE USE (MISCELLANEOUS) ×2
VALVE BIOPSY SINGLE USE (MISCELLANEOUS) IMPLANT
VALVE DISPOSABLE (MISCELLANEOUS) ×3 IMPLANT
VALVE SUCTION BRONCHIO DISP (MISCELLANEOUS) ×2 IMPLANT
WATER STERILE IRR 1000ML POUR (IV SOLUTION) ×3 IMPLANT

## 2018-04-05 NOTE — Anesthesia Postprocedure Evaluation (Signed)
Anesthesia Post Note  Patient: Tyler [name]  Procedure(s) Performed: VIDEO BRONCHOSCOPY WITH ENDOBRONCHIAL NAVIGATION (N/A )     Patient location during evaluation: PACU Anesthesia Type: General Level of consciousness: awake and alert Pain management: pain level controlled Vital Signs Assessment: post-procedure vital signs reviewed and stable Respiratory status: spontaneous breathing, nonlabored ventilation, respiratory function stable and patient connected to nasal cannula oxygen Cardiovascular status: blood pressure returned to baseline and stable Postop Assessment: no apparent nausea or vomiting Anesthetic complications: no    Last Vitals:  Vitals:   04/05/18 1000 04/05/18 1030  BP: 111/79 111/78  Pulse: 66 61  Resp: 16 15  Temp:  (!) 36.4 C  SpO2: 100% 100%    Last Pain:  Vitals:   04/05/18 1045  TempSrc:   PainSc: 0-No pain                 Ryan P Ellender

## 2018-04-05 NOTE — Interval H&P Note (Signed)
History and Physical Interval Note:  04/05/2018 6:51 AM  Tyler [name]  has presented today for surgery, with the diagnosis of LEFT UPPER LOBE LUNG MASS  The various methods of treatment have been discussed with the patient and family. After consideration of risks, benefits and other options for treatment, the patient has consented to  Procedure(s): [place] (Left) as a surgical intervention .  The patient's history has been reviewed, patient examined, no change in status, stable for surgery.  I have reviewed the patient's chart and labs.  Questions were answered to the patient's satisfaction.    Patient doing well this morning. Present with his wife. All questions answered. Explained risks as well as potential for not obtaining a definite answer but this was a safe option at attempting sample of the nodule tissue. No barriers to proceed.   Tyler Nash, DO [facility] 04/05/2018 6:52 AM  Personal pager: [phone] If unanswered, please page CCM On-call: [phone]

## 2018-04-05 NOTE — Transfer of Care (Signed)
Immediate Anesthesia Transfer of Care Note  Patient: Tyler [name]  Procedure(s) Performed: VIDEO BRONCHOSCOPY WITH ENDOBRONCHIAL NAVIGATION (N/A )  Patient Location: PACU  Anesthesia Type:General  Level of Consciousness: awake and alert   Airway & Oxygen Therapy: Patient Spontanous Breathing and Patient connected to nasal cannula oxygen  Post-op Assessment: Report given to RN, Post -op Vital signs reviewed and stable and Patient moving all extremities  Post vital signs: Reviewed and stable  Last Vitals:  Vitals Value Taken Time  BP 125/83 04/05/2018  9:28 AM  Temp    Pulse 68 04/05/2018  9:30 AM  Resp 7 04/05/2018  9:29 AM  SpO2 100 % 04/05/2018  9:30 AM  Vitals shown include unvalidated device data.  Last Pain:  Vitals:   04/05/18 0617  TempSrc:   PainSc: 0-No pain      Patients Stated Pain Goal: 1 (68/59/[id number])  Complications: No apparent anesthesia complications

## 2018-04-05 NOTE — Discharge Instructions (Signed)
Flexible Bronchoscopy, Care After These instructions give you information on caring for yourself after your procedure. Your doctor may also give you more specific instructions. Call your doctor if you have any problems or questions after your procedure. Follow these instructions at home:  The day after the test, you may eat your normal diet.  You may do your normal activities.  Keep all doctor visits. Get help right away if:  You get more and more short of breath.  You get light-headed.  You feel like you are going to pass out (faint).  You have chest pain.  You have new problems that worry you.  You cough up more than a little blood.  You cough up more blood than before.  This information is not intended to replace advice given to you by your health care provider. Make sure you discuss any questions you have with your health care provider. Document Released: 04/25/2009 Document Revised: 12/04/2015 Document Reviewed: 03/02/2013 Elsevier Interactive Patient Education  2017 [place].   Please call us if there any concerns.   Garner Nash, DO [facility] 04/05/2018 9:20 AM  OFFICE: [phone]   Post Anesthesia Home Care Instructions  Activity: Get plenty of rest for the remainder of the day. A responsible individual must stay with you for 24 hours following the procedure.  For the next 24 hours, DO NOT: -Drive a car -Paediatric nurse -Drink alcoholic beverages -Take any medication unless instructed by your physician -Make any legal decisions or sign important papers.  Meals: Start with liquid foods such as gelatin or soup. Progress to regular foods as tolerated. Avoid greasy, spicy, heavy foods. If nausea and/or vomiting occur, drink only clear liquids until the nausea and/or vomiting subsides. Call your physician if vomiting continues.  Special Instructions/Symptoms: Your throat may feel dry or sore from the anesthesia or the breathing  tube placed in your throat during surgery. If this causes discomfort, gargle with warm salt water. The discomfort should disappear within 24 hours.  If you had a scopolamine patch placed behind your ear for the management of post- operative nausea and/or vomiting:  1. The medication in the patch is effective for 72 hours, after which it should be removed.  Wrap patch in a tissue and discard in the trash. Wash hands thoroughly with soap and water. 2. You may remove the patch earlier than 72 hours if you experience unpleasant side effects which may include dry mouth, dizziness or visual disturbances. 3. Avoid touching the patch. Wash your hands with soap and water after contact with the patch.

## 2018-04-05 NOTE — Op Note (Signed)
Video Bronchoscopy with Electromagnetic Navigation Procedure Note  Date of Operation: 04/05/2018  Pre-op Diagnosis: RUL and RLL lung nodule   Post-op Diagnosis: RUL and RLL lung nodule  R Surgeon: Garner Nash, DO   Assistants: Baltazar Apo, MD   Anesthesia: General endotracheal anesthesia  Operation: Flexible video fiberoptic bronchoscopy with electromagnetic navigation and biopsies.  Estimated Blood Loss: Minimal, <6OT  Complications: None   Indications and History: Tyler [name] is a 57 y.o. male with RUL and RLL lung nodule.  The risks, benefits, complications, treatment options and expected outcomes were discussed with the patient.  The possibilities of pneumothorax, pneumonia, reaction to medication, pulmonary aspiration, perforation of a viscus, bleeding, failure to diagnose a condition and creating a complication requiring transfusion or operation were discussed with the patient who freely signed the consent.    Description of Procedure: The patient was seen in the Preoperative Area, was examined and was deemed appropriate to proceed.  The patient was taken to [facility] OR 10, identified as Tyler [name] and the procedure verified as Flexible Video Fiberoptic Bronchoscopy.  A Time Out was held and the above information confirmed.   Prior to the date of the procedure a high-resolution CT scan of the chest was performed. Utilizing [place] a virtual tracheobronchial tree was generated to allow the creation of distinct navigation pathways to the patient's parenchymal abnormalities. After being taken to the operating room general anesthesia was initiated and the patient  was orally intubated. The video fiberoptic bronchoscope was introduced via the endotracheal tube and a general inspection was performed which showed no endobronchial lesion, normal anatomy with bronchiectatic openings and scattered pitting. The extendable working channel and locator guide were introduced  into the bronchoscope. The distinct navigation pathways prepared prior to this procedure were then utilized to navigate to within 0.5cm of patient's lesion(s) identified on CT scan within the RUL and RLL nodule. The extendable working channel was secured into place and the locator guide was withdrawn. Under fluoroscopic guidance transbronchial needle brushings, transbronchial Wang needle biopsies (performed by Dr. Lamonte Sakai in both locations), and transbronchial forceps biopsies were performed to be sent for cytology and pathology from both RUL and RLL nodule locations. A bronchioalveolar lavage was performed in the RLL and RUL locations with 25cc of saline and moderate return. This was sent for cytology and microbiology (bacterial, fungal, AFB smears and cultures). At the end of the procedure a general airway inspection was performed and there was no evidence of active bleeding. The bronchoscope was removed.  The patient tolerated the procedure well. There was no significant blood loss and there were no obvious complications. A post-procedural chest x-ray is pending.  Samples: 1. Transbronchial needle brushings from RLL and RUL 2. Transbronchial Wang needle biopsies from RLL and RUL  3. Transbronchial forceps biopsies from RLL and RUL  4. Bronchoalveolar lavage from RLL and RUL   Plans:  The patient will be discharged from the PACU to home when recovered from anesthesia and after chest x-ray is reviewed. We will review the cytology, pathology and microbiology results with the patient when they become available. Outpatient followup will be with Octavio Graves Tita Terhaar, DO in one week.   Garner Nash, DO [facility] 04/05/2018 9:10 AM  Personal pager: [phone] If unanswered, please page CCM On-call: [phone]

## 2018-04-05 NOTE — Anesthesia Procedure Notes (Signed)
Procedure Name: Intubation Date/Time: 04/05/2018 7:44 AM Performed by: Demarrion Meiklejohn T, CRNA Pre-anesthesia Checklist: Patient identified, Emergency Drugs available, Suction available and Patient being monitored Patient Re-evaluated:Patient Re-evaluated prior to induction Oxygen Delivery Method: Circle system utilized Preoxygenation: Pre-oxygenation with 100% oxygen Induction Type: IV induction Ventilation: Mask ventilation without difficulty Laryngoscope Size: Glidescope and 4 Grade View: Grade III Tube type: Oral Tube size: 9.0 mm Number of attempts: 2 Airway Equipment and Method: Patient positioned with wedge pillow,  Stylet and Video-laryngoscopy Placement Confirmation: ETT inserted through vocal cords under direct vision,  positive ETCO2 and breath sounds checked- equal and bilateral Secured at: 23 cm Tube secured with: Tape Dental Injury: Teeth and Oropharynx as per pre-operative assessment  Difficulty Due To: Difficulty was anticipated, Difficult Airway- due to anterior larynx, Difficult Airway- due to limited oral opening and Difficult Airway- due to dentition Future Recommendations: Recommend- induction with short-acting agent, and alternative techniques readily available

## 2018-04-06 ENCOUNTER — Encounter (HOSPITAL_COMMUNITY): Payer: Self-pay | Admitting: Pulmonary Disease

## 2018-04-06 LAB — ACID FAST SMEAR (AFB)

## 2018-04-06 LAB — ACID FAST SMEAR (AFB, MYCOBACTERIA)
Acid Fast Smear: NEGATIVE
Acid Fast Smear: NEGATIVE

## 2018-04-07 LAB — CULTURE, RESPIRATORY

## 2018-04-07 LAB — CULTURE, RESPIRATORY W GRAM STAIN
Culture: NO GROWTH
Culture: NORMAL

## 2018-04-07 LAB — FUNGUS STAIN

## 2018-04-07 LAB — FUNGAL STAIN REFLEX

## 2018-04-19 ENCOUNTER — Ambulatory Visit (INDEPENDENT_AMBULATORY_CARE_PROVIDER_SITE_OTHER): Payer: Self-pay | Admitting: Pulmonary Disease

## 2018-04-19 ENCOUNTER — Encounter: Payer: Self-pay | Admitting: Pulmonary Disease

## 2018-04-19 VITALS — BP 118/82 | HR 82 | Ht 74.0 in | Wt 142.6 lb

## 2018-04-19 DIAGNOSIS — R942 Abnormal results of pulmonary function studies: Secondary | ICD-10-CM

## 2018-04-19 DIAGNOSIS — R918 Other nonspecific abnormal finding of lung field: Secondary | ICD-10-CM

## 2018-04-19 DIAGNOSIS — R0602 Shortness of breath: Secondary | ICD-10-CM

## 2018-04-19 DIAGNOSIS — R911 Solitary pulmonary nodule: Secondary | ICD-10-CM

## 2018-04-19 DIAGNOSIS — Z720 Tobacco use: Secondary | ICD-10-CM

## 2018-04-19 NOTE — Progress Notes (Signed)
Synopsis: Referred in September 2019 for navigational bronchoscopy by Brand Males, MD  Subjective:   PATIENT ID: Tyler [name] GENDER: male DOB: 09-Jul-1961, MRN: [id number]  Chief Complaint  Patient presents with  . Follow-up    States his breathing has been good. States the spiriva and albuterol has worked for him.     Initial OV: This is a 57 year old male with a past medical history of influenza A diagnosed in January 2019 admitted to the hospital at the time there was question of post viral Boop and was started on steroids.  He is followed by Dr. Chase Caller in the pulmonary clinic.  Patient has a medical history of alcohol abuse, chronic back pain, tobacco abuse with approximately 40-pack-year history of smoking.  Subsequent follow-up CT imaging initially in January 2019 revealed a left upper lobe 8 mm nodule.  Follow-up imaging revealed low-grade PET uptake.  There was low-grade PET uptake SQ the 2.1 within the right lower lobe medial nodule.  Patient has no complaints today in the office.  He is down to smoking a few cigarettes per week.  He does state he has lost a significant amount of weight of approximately 40 pounds since January.   OV 04/19/2018: The patient electromagnetic navigational bronchoscopy on 04/05/2018.  We sampled the right lower lobe medial nodule as well as the right upper lobe nodule.  All pathologic samples from the bronchoscopy were negative for malignancy.  The right lower lobe needle aspirations were positive for scant granulomatous formation however was nondiagnostic. He is currently down to 1 cigarette per day. Currently breathing is doing ok. He has been using his spiriva. His inhalers are expensive. He is concerned about that. He is otherwise complaint free today from a respiratory standpoint.    Past Medical History:  Diagnosis Date  . Alcohol abuse   . Chronic back pain   . Lung nodule   . Multiple pulmonary nodules 04/02/2018  . Pneumonia   . Right  lower lobe pulmonary nodule 04/02/2018   12/01/2017: PET SUV 2.1  . Tobacco abuse      Family History  Problem Relation Age of Onset  . Heart attack Maternal Grandfather      Past Surgical History:  Procedure Laterality Date  . APPENDECTOMY    . VIDEO BRONCHOSCOPY WITH ENDOBRONCHIAL NAVIGATION N/A 04/05/2018   Procedure: VIDEO BRONCHOSCOPY WITH ENDOBRONCHIAL NAVIGATION;  Surgeon: Garner Nash, DO;  Location: [facility];  Service: Thoracic;  Laterality: N/A;  . WISDOM TOOTH EXTRACTION      Social History   Socioeconomic History  . Marital status: Married    Spouse name: Not on file  . Number of children: Not on file  . Years of education: Not on file  . Highest education level: Not on file  Occupational History  . Not on file  Social Needs  . Financial resource strain: Not on file  . Food insecurity:    Worry: Not on file    Inability: Not on file  . Transportation needs:    Medical: Not on file    Non-medical: Not on file  Tobacco Use  . Smoking status: Current Every Day Smoker    Packs/day: 1.00    Years: 40.00    Pack years: 40.00    Types: Cigarettes  . Smokeless tobacco: Never Used  . Tobacco comment: 1 cigarette/day 04/03/2018  Substance and Sexual Activity  . Alcohol use: Not Currently  . Drug use: Not Currently    Types: Marijuana  .  Sexual activity: Not on file  Lifestyle  . Physical activity:    Days per week: Not on file    Minutes per session: Not on file  . Stress: Not on file  Relationships  . Social connections:    Talks on phone: Not on file    Gets together: Not on file    Attends religious service: Not on file    Active member of club or organization: Not on file    Attends meetings of clubs or organizations: Not on file    Relationship status: Not on file  . Intimate partner violence:    Fear of current or ex partner: Not on file    Emotionally abused: Not on file    Physically abused: Not on file    Forced sexual activity: Not on file    Other Topics Concern  . Not on file  Social History Narrative  . Not on file     No Known Allergies   Outpatient Medications Prior to Visit  Medication Sig Dispense Refill  . albuterol (PROVENTIL HFA;VENTOLIN HFA) 108 (90 Base) MCG/ACT inhaler Inhale 2 puffs into the lungs every 6 (six) hours as needed for wheezing or shortness of breath. 1 Inhaler 0  . feeding supplement, ENSURE ENLIVE, (ENSURE ENLIVE) LIQD Take 237 mLs by mouth 3 (three) times daily between meals. 90 Bottle 0  . tiotropium (SPIRIVA HANDIHALER) 18 MCG inhalation capsule Place 1 capsule (18 mcg total) into inhaler and inhale daily. 90 capsule 1   No facility-administered medications prior to visit.     Review of Systems  Constitutional: Negative for chills, fever, malaise/fatigue and weight loss.  HENT: Negative for hearing loss, sore throat and tinnitus.   Eyes: Negative for blurred vision and double vision.  Respiratory: Positive for shortness of breath. Negative for cough, hemoptysis, sputum production, wheezing and stridor.   Cardiovascular: Negative for chest pain, palpitations, orthopnea, leg swelling and PND.  Gastrointestinal: Negative for abdominal pain, constipation, diarrhea, heartburn, nausea and vomiting.  Genitourinary: Negative for dysuria, hematuria and urgency.  Musculoskeletal: Negative for joint pain and myalgias.  Skin: Negative for itching and rash.  Neurological: Negative for dizziness, tingling, weakness and headaches.  Endo/Heme/Allergies: Negative for environmental allergies. Does not bruise/bleed easily.  Psychiatric/Behavioral: Negative for depression. The patient is not nervous/anxious and does not have insomnia.   All other systems reviewed and are negative.    Objective:  Physical Exam  Constitutional: He is oriented to person, place, and time. He appears well-developed and well-nourished. No distress.  HENT:  Head: Normocephalic and atraumatic.  Mouth/Throat: Oropharynx is clear  and moist.  Eyes: Pupils are equal, round, and reactive to light. Conjunctivae are normal. No scleral icterus.  Neck: Neck supple. No JVD present. No tracheal deviation present.  Cardiovascular: Normal rate, regular rhythm, normal heart sounds and intact distal pulses.  No murmur heard. Pulmonary/Chest: Effort normal and breath sounds normal. No accessory muscle usage or stridor. No tachypnea. No respiratory distress. He has no wheezes. He has no rhonchi. He has no rales.  Abdominal: Soft. Bowel sounds are normal. He exhibits no distension. There is no tenderness.  Musculoskeletal: He exhibits no edema or tenderness.  Lymphadenopathy:    He has no cervical adenopathy.  Neurological: He is alert and oriented to person, place, and time.  Skin: Skin is warm and dry. Capillary refill takes less than 2 seconds. No rash noted.  Psychiatric: He has a normal mood and affect. His behavior is normal.  Vitals  reviewed.    Vitals:   04/19/18 1427  BP: 118/82  Pulse: 82  SpO2: 100%  Weight: 142 lb 9.6 oz (64.7 kg)  Height: 6\' 2"  (1.88 m)   100% on  RA BMI Readings from Last 3 Encounters:  04/19/18 18.31 kg/m  04/05/18 17.72 kg/m  04/03/18 17.46 kg/m   Wt Readings from Last 3 Encounters:  04/19/18 142 lb 9.6 oz (64.7 kg)  04/05/18 138 lb (62.6 kg)  04/03/18 136 lb (61.7 kg)     CBC    Component Value Date/Time   WBC 7.1 04/03/2018 1449   RBC 4.99 04/03/2018 1449   HGB 12.9 (L) 04/05/2018 0710   HCT 38.0 (L) 04/05/2018 0710   PLT 147.0 (L) 04/03/2018 1449   MCV 76.1 (L) 04/03/2018 1449   MCH 23.6 (L) 08/10/2017 0748   MCHC 32.4 04/03/2018 1449   RDW 15.0 04/03/2018 1449   LYMPHSABS 2.3 04/03/2018 1449   MONOABS 0.8 04/03/2018 1449   EOSABS 0.1 04/03/2018 1449   BASOSABS 0.1 04/03/2018 1449    Chest Imaging: 03/27/2018 CT super D chest: Multiple bilateral pulmonary nodules.  Most concerning of the nodules including the medial right lower lobe peribronchovascular nodule.   Also small nodule in the right upper lobe.  Pulmonary Functions Testing Results: 12/01/2017 full PFTs: Attempted, patient was unable to cooperate with exam.  FeNO: None   Pathology: None   Echocardiogram:  Study Conclusions  - Left ventricle: The cavity size was normal. Systolic function was   normal. The estimated ejection fraction was in the range of 60%   to 65%. Wall motion was normal; there were no regional wall   motion abnormalities. Doppler parameters are consistent with   abnormal left ventricular relaxation (grade 1 diastolic   dysfunction). Doppler parameters are consistent with   indeterminate ventricular filling pressure. - Aortic valve: Transvalvular velocity was within the normal range.   There was no stenosis. There was no regurgitation. - Mitral valve: Transvalvular velocity was within the normal range.   There was no evidence for stenosis. There was no regurgitation. - Right ventricle: The cavity size was normal. Wall thickness was   normal. Systolic function was normal. - Tricuspid valve: There was mild regurgitation. - Pulmonary arteries: Systolic pressure was within the normal   range. PA peak pressure: 26 mm Hg (S).  Heart Catheterization: None    Assessment & Plan:   Right lower lobe pulmonary nodule - Plan: CT Chest Wo Contrast, Ambulatory referral to Internal Medicine  Right upper lobe pulmonary nodule - Plan: Ambulatory referral to Internal Medicine  Tobacco abuse - Plan: Ambulatory referral to Internal Medicine  Multiple pulmonary nodules - Plan: Ambulatory referral to Internal Medicine  Abnormal PET scan of lung - Plan: Ambulatory referral to Internal Medicine  Shortness of breath - Plan: CT Chest Wo Contrast, Ambulatory referral to Internal Medicine  Discussion:  Today we discussed follow-up regarding his lung nodules.  We also discussed the pathology results of his recent biopsy.  After risks versus benefits versus alternative discussion  with the patient regarding next steps we have elected for a 7-month noncontrasted CT of the chest to be completed to look for any change in size of the lung nodules.  The patient is agreeable to this plan.  We will also work with our medication assistance program to help obtain his Spiriva.  We will refer the patient to community health and wellness to establish care for primary care purposes.  The patient was counseled on  smoking cessation for greater than 10 minutes of this visit.  Return to clinic in approximately 9 weeks following CT scan.  Greater than 50% of this 25 min visit was spent face-to-face counseling the patient on the above treatment regimen and management plans.   Current Outpatient Medications:  .  albuterol (PROVENTIL HFA;VENTOLIN HFA) 108 (90 Base) MCG/ACT inhaler, Inhale 2 puffs into the lungs every 6 (six) hours as needed for wheezing or shortness of breath., Disp: 1 Inhaler, Rfl: 0 .  feeding supplement, ENSURE ENLIVE, (ENSURE ENLIVE) LIQD, Take 237 mLs by mouth 3 (three) times daily between meals., Disp: 90 Bottle, Rfl: 0 .  tiotropium (SPIRIVA HANDIHALER) 18 MCG inhalation capsule, Place 1 capsule (18 mcg total) into inhaler and inhale daily., Disp: 90 capsule, Rfl: 1   Garner Nash, DO [facility] 04/19/2018 2:54 PM

## 2018-04-19 NOTE — Patient Instructions (Addendum)
Thank you for visiting Dr. Valeta Harms at [facility]. Today we recommend the following:  Orders Placed This Encounter  Procedures  . CT Chest Wo Contrast  . Ambulatory referral to Internal Medicine   Return in about 9 weeks (around 06/21/2018), or if symptoms worsen or fail to improve.

## 2018-05-19 LAB — ACID FAST CULTURE WITH REFLEXED SENSITIVITIES: ACID FAST CULTURE - AFSCU3: NEGATIVE

## 2018-05-30 LAB — ACID FAST CULTURE WITH REFLEXED SENSITIVITIES (MYCOBACTERIA): Acid Fast Culture: NEGATIVE

## 2018-06-12 ENCOUNTER — Ambulatory Visit (INDEPENDENT_AMBULATORY_CARE_PROVIDER_SITE_OTHER)
Admission: RE | Admit: 2018-06-12 | Discharge: 2018-06-12 | Disposition: A | Discharge status: Home / Self Care | Payer: Self-pay | Source: Ambulatory Visit | Attending: Pulmonary Disease | Admitting: Pulmonary Disease

## 2018-06-12 DIAGNOSIS — R911 Solitary pulmonary nodule: Secondary | ICD-10-CM

## 2018-06-12 DIAGNOSIS — R0602 Shortness of breath: Secondary | ICD-10-CM

## 2018-06-21 ENCOUNTER — Ambulatory Visit: Payer: Self-pay | Admitting: Pulmonary Disease

## 2018-06-21 NOTE — Progress Notes (Deleted)
Synopsis: Referred in September 2019 for navigational bronchoscopy by Brand Males, MD  Subjective:   PATIENT ID: Tyler [name] GENDER: male DOB: 1961-04-26, MRN: [id number]  No chief complaint on file.   Initial OV: This is a 57 year old male with a past medical history of influenza A diagnosed in January 2019 admitted to the hospital at the time there was question of post viral Boop and was started on steroids.  He is followed by Dr. Chase Caller in the pulmonary clinic.  Patient has a medical history of alcohol abuse, chronic back pain, tobacco abuse with approximately 40-pack-year history of smoking.  Subsequent follow-up CT imaging initially in January 2019 revealed a left upper lobe 8 mm nodule.  Follow-up imaging revealed low-grade PET uptake.  There was low-grade PET uptake SQ the 2.1 within the right lower lobe medial nodule.  Patient has no complaints today in the office.  He is down to smoking a few cigarettes per week.  He does state he has lost a significant amount of weight of approximately 40 pounds since January.   OV 04/19/2018: The patient electromagnetic navigational bronchoscopy on 04/05/2018.  We sampled the right lower lobe medial nodule as well as the right upper lobe nodule.  All pathologic samples from the bronchoscopy were negative for malignancy.  The right lower lobe needle aspirations were positive for scant granulomatous formation however was nondiagnostic. He is currently down to 1 cigarette per day. Currently breathing is doing ok. He has been using his spiriva. His inhalers are expensive. He is concerned about that. He is otherwise complaint free today from a respiratory standpoint.   OV 06/21/2018: Since her last office visit the patient has had repeat noncontrast CT imaging of the chest.  Completed on 06/12/2018 for follow-up of his right lower lobe pulmonary nodule.  At this point he has a stable irregular nodule over the right infrahilar region of the medial right  lower lobe measuring 1 x 1.2 cm.  He has several other small subcentimeter bilateral pulmonary nodules that are unchanged compared to previous.      Past Medical History:  Diagnosis Date  . Alcohol abuse   . Chronic back pain   . Lung nodule   . Multiple pulmonary nodules 04/02/2018  . Pneumonia   . Right lower lobe pulmonary nodule 04/02/2018   12/01/2017: PET SUV 2.1  . Tobacco abuse      Family History  Problem Relation Age of Onset  . Heart attack Maternal Grandfather      Past Surgical History:  Procedure Laterality Date  . APPENDECTOMY    . VIDEO BRONCHOSCOPY WITH ENDOBRONCHIAL NAVIGATION N/A 04/05/2018   Procedure: VIDEO BRONCHOSCOPY WITH ENDOBRONCHIAL NAVIGATION;  Surgeon: Garner Nash, DO;  Location: [place];  Service: Thoracic;  Laterality: N/A;  . WISDOM TOOTH EXTRACTION      Social History   Socioeconomic History  . Marital status: Married    Spouse name: Not on file  . Number of children: Not on file  . Years of education: Not on file  . Highest education level: Not on file  Occupational History  . Not on file  Social Needs  . Financial resource strain: Not on file  . Food insecurity:    Worry: Not on file    Inability: Not on file  . Transportation needs:    Medical: Not on file    Non-medical: Not on file  Tobacco Use  . Smoking status: Current Every Day Smoker    Packs/day:  1.00    Years: 40.00    Pack years: 40.00    Types: Cigarettes  . Smokeless tobacco: Never Used  . Tobacco comment: 1 cigarette/day 04/03/2018  Substance and Sexual Activity  . Alcohol use: Not Currently  . Drug use: Not Currently    Types: Marijuana  . Sexual activity: Not on file  Lifestyle  . Physical activity:    Days per week: Not on file    Minutes per session: Not on file  . Stress: Not on file  Relationships  . Social connections:    Talks on phone: Not on file    Gets together: Not on file    Attends religious service: Not on file    Active member of  club or organization: Not on file    Attends meetings of clubs or organizations: Not on file    Relationship status: Not on file  . Intimate partner violence:    Fear of current or ex partner: Not on file    Emotionally abused: Not on file    Physically abused: Not on file    Forced sexual activity: Not on file  Other Topics Concern  . Not on file  Social History Narrative  . Not on file     No Known Allergies   Outpatient Medications Prior to Visit  Medication Sig Dispense Refill  . albuterol (PROVENTIL HFA;VENTOLIN HFA) 108 (90 Base) MCG/ACT inhaler Inhale 2 puffs into the lungs every 6 (six) hours as needed for wheezing or shortness of breath. 1 Inhaler 0  . feeding supplement, ENSURE ENLIVE, (ENSURE ENLIVE) LIQD Take 237 mLs by mouth 3 (three) times daily between meals. 90 Bottle 0  . tiotropium (SPIRIVA HANDIHALER) 18 MCG inhalation capsule Place 1 capsule (18 mcg total) into inhaler and inhale daily. 90 capsule 1   No facility-administered medications prior to visit.     Review of Systems  Constitutional: Negative for chills, fever, malaise/fatigue and weight loss.  HENT: Negative for hearing loss, sore throat and tinnitus.   Eyes: Negative for blurred vision and double vision.  Respiratory: Positive for shortness of breath. Negative for cough, hemoptysis, sputum production, wheezing and stridor.   Cardiovascular: Negative for chest pain, palpitations, orthopnea, leg swelling and PND.  Gastrointestinal: Negative for abdominal pain, constipation, diarrhea, heartburn, nausea and vomiting.  Genitourinary: Negative for dysuria, hematuria and urgency.  Musculoskeletal: Negative for joint pain and myalgias.  Skin: Negative for itching and rash.  Neurological: Negative for dizziness, tingling, weakness and headaches.  Endo/Heme/Allergies: Negative for environmental allergies. Does not bruise/bleed easily.  Psychiatric/Behavioral: Negative for depression. The patient is not  nervous/anxious and does not have insomnia.   All other systems reviewed and are negative.    Objective:  Physical Exam  Constitutional: He is oriented to person, place, and time. He appears well-developed and well-nourished. No distress.  HENT:  Head: Normocephalic and atraumatic.  Mouth/Throat: Oropharynx is clear and moist.  Eyes: Pupils are equal, round, and reactive to light. Conjunctivae are normal. No scleral icterus.  Neck: Neck supple. No JVD present. No tracheal deviation present.  Cardiovascular: Normal rate, regular rhythm, normal heart sounds and intact distal pulses.  No murmur heard. Pulmonary/Chest: Effort normal and breath sounds normal. No accessory muscle usage or stridor. No tachypnea. No respiratory distress. He has no wheezes. He has no rhonchi. He has no rales.  Abdominal: Soft. Bowel sounds are normal. He exhibits no distension. There is no tenderness.  Musculoskeletal: He exhibits no edema or tenderness.  Lymphadenopathy:    He has no cervical adenopathy.  Neurological: He is alert and oriented to person, place, and time.  Skin: Skin is warm and dry. Capillary refill takes less than 2 seconds. No rash noted.  Psychiatric: He has a normal mood and affect. His behavior is normal.  Vitals reviewed.    There were no vitals filed for this visit.   on  RA BMI Readings from Last 3 Encounters:  04/19/18 18.31 kg/m  04/05/18 17.72 kg/m  04/03/18 17.46 kg/m   Wt Readings from Last 3 Encounters:  04/19/18 142 lb 9.6 oz (64.7 kg)  04/05/18 138 lb (62.6 kg)  04/03/18 136 lb (61.7 kg)     CBC    Component Value Date/Time   WBC 7.1 04/03/2018 1449   RBC 4.99 04/03/2018 1449   HGB 12.9 (L) 04/05/2018 0710   HCT 38.0 (L) 04/05/2018 0710   PLT 147.0 (L) 04/03/2018 1449   MCV 76.1 (L) 04/03/2018 1449   MCH 23.6 (L) 08/10/2017 0748   MCHC 32.4 04/03/2018 1449   RDW 15.0 04/03/2018 1449   LYMPHSABS 2.3 04/03/2018 1449   MONOABS 0.8 04/03/2018 1449    EOSABS 0.1 04/03/2018 1449   BASOSABS 0.1 04/03/2018 1449    Chest Imaging:  12/07/2017 nuclear medicine PET: Left lower lobe peribronchovascular nodule 0.9 x 1.2 cm, SUV max 2.1.  03/27/2018 CT super D chest: Multiple bilateral pulmonary nodules.  Most concerning of the nodules including the medial right lower lobe peribronchovascular nodule.  Also small nodule in the right upper lobe.  06/12/2018 CT chest: Right lower lobe peribronchovascular nodule stable 1 x 1.2 cm.  Smaller bilateral nodules unchanged.  Pulmonary Functions Testing Results: 12/01/2017 full PFTs: Attempted, patient was unable to cooperate with exam.  FeNO: None   Pathology:  04/05/2018 bronchoscopy, navigational biopsies: All cytology negative.   Echocardiogram:  Study Conclusions  - Left ventricle: The cavity size was normal. Systolic function was   normal. The estimated ejection fraction was in the range of 60%   to 65%. Wall motion was normal; there were no regional wall   motion abnormalities. Doppler parameters are consistent with   abnormal left ventricular relaxation (grade 1 diastolic   dysfunction). Doppler parameters are consistent with   indeterminate ventricular filling pressure. - Aortic valve: Transvalvular velocity was within the normal range.   There was no stenosis. There was no regurgitation. - Mitral valve: Transvalvular velocity was within the normal range.   There was no evidence for stenosis. There was no regurgitation. - Right ventricle: The cavity size was normal. Wall thickness was   normal. Systolic function was normal. - Tricuspid valve: There was mild regurgitation. - Pulmonary arteries: Systolic pressure was within the normal   range. PA peak pressure: 26 mm Hg (S).  Heart Catheterization: None    Assessment & Plan:   Right lower lobe pulmonary nodule  Right upper lobe pulmonary nodule  Multiple pulmonary nodules  Tobacco abuse  Abnormal PET scan of lung  Shortness  of breath  Discussion:  ***  Today we discussed follow-up regarding his lung nodules.  We also discussed the pathology results of his recent biopsy.  After risks versus benefits versus alternative discussion with the patient regarding next steps we have elected for a 38-month noncontrasted CT of the chest to be completed to look for any change in size of the lung nodules.  The patient is agreeable to this plan.  We will also work with our medication assistance program to  help obtain his Spiriva.  We will refer the patient to community health and wellness to establish care for primary care purposes.  The patient was counseled on smoking cessation for greater than 10 minutes of this visit.  Return to clinic in approximately 9 weeks following CT scan.  Greater than 50% of this 25 min visit was spent face-to-face counseling the patient on the above treatment regimen and management plans.   Current Outpatient Medications:  .  albuterol (PROVENTIL HFA;VENTOLIN HFA) 108 (90 Base) MCG/ACT inhaler, Inhale 2 puffs into the lungs every 6 (six) hours as needed for wheezing or shortness of breath., Disp: 1 Inhaler, Rfl: 0 .  feeding supplement, ENSURE ENLIVE, (ENSURE ENLIVE) LIQD, Take 237 mLs by mouth 3 (three) times daily between meals., Disp: 90 Bottle, Rfl: 0 .  tiotropium (SPIRIVA HANDIHALER) 18 MCG inhalation capsule, Place 1 capsule (18 mcg total) into inhaler and inhale daily., Disp: 90 capsule, Rfl: 1   Garner Nash, DO [facility] 06/21/2018 11:47 AM

## 2018-06-27 ENCOUNTER — Ambulatory Visit (INDEPENDENT_AMBULATORY_CARE_PROVIDER_SITE_OTHER): Payer: Self-pay | Admitting: Pulmonary Disease

## 2018-06-27 ENCOUNTER — Encounter: Payer: Self-pay | Admitting: Pulmonary Disease

## 2018-06-27 VITALS — BP 100/78 | HR 105 | Ht 74.0 in | Wt 143.0 lb

## 2018-06-27 DIAGNOSIS — Z720 Tobacco use: Secondary | ICD-10-CM

## 2018-06-27 DIAGNOSIS — J439 Emphysema, unspecified: Secondary | ICD-10-CM

## 2018-06-27 DIAGNOSIS — R918 Other nonspecific abnormal finding of lung field: Secondary | ICD-10-CM

## 2018-06-27 DIAGNOSIS — R911 Solitary pulmonary nodule: Secondary | ICD-10-CM

## 2018-06-27 DIAGNOSIS — Z23 Encounter for immunization: Secondary | ICD-10-CM

## 2018-06-27 MED ORDER — TIOTROPIUM BROMIDE MONOHYDRATE 18 MCG IN CAPS: 18.0000 ug | ORAL_CAPSULE | Freq: Every day | RESPIRATORY_TRACT | 3 refills | Status: DC

## 2018-06-27 MED ORDER — ALBUTEROL SULFATE HFA 108 (90 BASE) MCG/ACT IN AERS: 2.0000 | INHALATION_SPRAY | Freq: Four times a day (QID) | RESPIRATORY_TRACT | 3 refills | Status: AC | PRN

## 2018-06-27 NOTE — Patient Instructions (Addendum)
Thank you for visiting Dr. Valeta Harms at [facility]. Today we recommend the following: Orders Placed This Encounter  Procedures  . CT CHEST WO CONTRAST   Meds ordered this encounter  Medications  . albuterol (PROVENTIL HFA;VENTOLIN HFA) 108 (90 Base) MCG/ACT inhaler    Sig: Inhale 2 puffs into the lungs every 6 (six) hours as needed for wheezing or shortness of breath.    Dispense:  1 Inhaler    Refill:  3  . tiotropium (SPIRIVA HANDIHALER) 18 MCG inhalation capsule    Sig: Place 1 capsule (18 mcg total) into inhaler and inhale daily.    Dispense:  90 capsule    Refill:  3   Return in about 18 weeks (around 10/31/2018).  Follow up to be scheduled with NP to review CT imaging

## 2018-06-27 NOTE — Progress Notes (Signed)
Synopsis: Referred in September 2019 for navigational bronchoscopy by Brand Males, MD  Subjective:   PATIENT ID: Tyler [name] GENDER: male DOB: Jul 10, 1961, MRN: [id number]  Chief Complaint  Patient presents with  . Follow-up    States his breathing is getting worse at night. States his inhalers does help however he only notices the SOB and coughing at night. States he has been noticing he is dizzy in the mornings.     Initial OV: This is a 57 year old male with a past medical history of influenza A diagnosed in January 2019 admitted to the hospital at the time there was question of post viral Boop and was started on steroids.  He is followed by Dr. Chase Caller in the pulmonary clinic.  Patient has a medical history of alcohol abuse, chronic back pain, tobacco abuse with approximately 40-pack-year history of smoking.  Subsequent follow-up CT imaging initially in January 2019 revealed a left upper lobe 8 mm nodule.  Follow-up imaging revealed low-grade PET uptake.  There was low-grade PET uptake SQ the 2.1 within the right lower lobe medial nodule.  Patient has no complaints today in the office.  He is down to smoking a few cigarettes per week.  He does state he has lost a significant amount of weight of approximately 40 pounds since January.   OV 04/19/2018: The patient electromagnetic navigational bronchoscopy on 04/05/2018.  We sampled the right lower lobe medial nodule as well as the right upper lobe nodule.  All pathologic samples from the bronchoscopy were negative for malignancy.  The right lower lobe needle aspirations were positive for scant granulomatous formation however was nondiagnostic. He is currently down to 1 cigarette per day. Currently breathing is doing ok. He has been using his spiriva. His inhalers are expensive. He is concerned about that. He is otherwise complaint free today from a respiratory standpoint.   OV 06/27/2018: Patient seen today for follow-up regarding his  recent CT imaging of his lung nodules.  Reviewing the CT images today in the office with the patient both nodules are stable in comparison to his previous imaging prior to bronchoscopy.  Patient is very relieved.  He has been anxious about this for some time.  He has noticed some changes in his breathing.  Unfortunately he has not been able to quit smoking.  He is currently smoking 1 to 2 cigarettes/day.  Mainly this is just due to habit.  He has been using his albuterol inhaler at nighttime.  Only using the albuterol a few times a week.  He still regularly uses his Spiriva.  He does need refills of this medication today.   Past Medical History:  Diagnosis Date  . Alcohol abuse   . Chronic back pain   . Lung nodule   . Multiple pulmonary nodules 04/02/2018  . Pneumonia   . Right lower lobe pulmonary nodule 04/02/2018   12/01/2017: PET SUV 2.1  . Tobacco abuse      Family History  Problem Relation Age of Onset  . Heart attack Maternal Grandfather      Past Surgical History:  Procedure Laterality Date  . APPENDECTOMY    . VIDEO BRONCHOSCOPY WITH ENDOBRONCHIAL NAVIGATION N/A 04/05/2018   Procedure: VIDEO BRONCHOSCOPY WITH ENDOBRONCHIAL NAVIGATION;  Surgeon: Garner Nash, DO;  Location: [place];  Service: Thoracic;  Laterality: N/A;  . WISDOM TOOTH EXTRACTION      Social History   Socioeconomic History  . Marital status: Married    Spouse name: Not on  file  . Number of children: Not on file  . Years of education: Not on file  . Highest education level: Not on file  Occupational History  . Not on file  Social Needs  . Financial resource strain: Not on file  . Food insecurity:    Worry: Not on file    Inability: Not on file  . Transportation needs:    Medical: Not on file    Non-medical: Not on file  Tobacco Use  . Smoking status: Current Every Day Smoker    Packs/day: 1.00    Years: 40.00    Pack years: 40.00    Types: Cigarettes  . Smokeless tobacco: Never Used  .  Tobacco comment: 1 cigarette/day 04/03/2018  Substance and Sexual Activity  . Alcohol use: Not Currently  . Drug use: Not Currently    Types: Marijuana  . Sexual activity: Not on file  Lifestyle  . Physical activity:    Days per week: Not on file    Minutes per session: Not on file  . Stress: Not on file  Relationships  . Social connections:    Talks on phone: Not on file    Gets together: Not on file    Attends religious service: Not on file    Active member of club or organization: Not on file    Attends meetings of clubs or organizations: Not on file    Relationship status: Not on file  . Intimate partner violence:    Fear of current or ex partner: Not on file    Emotionally abused: Not on file    Physically abused: Not on file    Forced sexual activity: Not on file  Other Topics Concern  . Not on file  Social History Narrative  . Not on file     No Known Allergies   Outpatient Medications Prior to Visit  Medication Sig Dispense Refill  . albuterol (PROVENTIL HFA;VENTOLIN HFA) 108 (90 Base) MCG/ACT inhaler Inhale 2 puffs into the lungs every 6 (six) hours as needed for wheezing or shortness of breath. 1 Inhaler 0  . feeding supplement, ENSURE ENLIVE, (ENSURE ENLIVE) LIQD Take 237 mLs by mouth 3 (three) times daily between meals. 90 Bottle 0  . tiotropium (SPIRIVA HANDIHALER) 18 MCG inhalation capsule Place 1 capsule (18 mcg total) into inhaler and inhale daily. 90 capsule 1   No facility-administered medications prior to visit.     Review of Systems  Constitutional: Negative for chills, fever, malaise/fatigue and weight loss.  HENT: Negative for hearing loss, sore throat and tinnitus.   Eyes: Negative for blurred vision and double vision.  Respiratory: Positive for shortness of breath. Negative for cough, hemoptysis, sputum production, wheezing and stridor.   Cardiovascular: Negative for chest pain, palpitations, orthopnea, leg swelling and PND.  Gastrointestinal:  Negative for abdominal pain, constipation, diarrhea, heartburn, nausea and vomiting.  Genitourinary: Negative for dysuria, hematuria and urgency.  Musculoskeletal: Negative for joint pain and myalgias.  Skin: Negative for itching and rash.  Neurological: Negative for dizziness, tingling, weakness and headaches.  Endo/Heme/Allergies: Negative for environmental allergies. Does not bruise/bleed easily.  Psychiatric/Behavioral: Negative for depression. The patient is not nervous/anxious and does not have insomnia.   All other systems reviewed and are negative.    Objective:  Physical Exam Vitals signs reviewed.  Constitutional:      General: He is not in acute distress.    Appearance: He is well-developed.  HENT:     Head: Normocephalic and atraumatic.  Eyes:     General: No scleral icterus.    Conjunctiva/sclera: Conjunctivae normal.     Pupils: Pupils are equal, round, and reactive to light.  Neck:     Musculoskeletal: Neck supple.     Vascular: No JVD.     Trachea: No tracheal deviation.  Cardiovascular:     Rate and Rhythm: Normal rate and regular rhythm.     Heart sounds: Normal heart sounds. No murmur.  Pulmonary:     Effort: Pulmonary effort is normal. No tachypnea, accessory muscle usage or respiratory distress.     Breath sounds: No stridor. No wheezing, rhonchi or rales.     Comments: Diminished breath sounds bilaterally Abdominal:     General: Bowel sounds are normal. There is no distension.     Palpations: Abdomen is soft.     Tenderness: There is no abdominal tenderness.  Musculoskeletal:        General: No tenderness.  Lymphadenopathy:     Cervical: No cervical adenopathy.  Skin:    General: Skin is warm and dry.     Capillary Refill: Capillary refill takes less than 2 seconds.     Findings: No rash.  Neurological:     Mental Status: He is alert and oriented to person, place, and time.  Psychiatric:        Behavior: Behavior normal.     Vitals:    06/27/18 1056  BP: 100/78  Pulse: (!) 105  SpO2: 100%  Weight: 143 lb (64.9 kg)  Height: 6\' 2"  (1.88 m)   100% on  RA BMI Readings from Last 3 Encounters:  06/27/18 18.36 kg/m  04/19/18 18.31 kg/m  04/05/18 17.72 kg/m   Wt Readings from Last 3 Encounters:  06/27/18 143 lb (64.9 kg)  04/19/18 142 lb 9.6 oz (64.7 kg)  04/05/18 138 lb (62.6 kg)     CBC    Component Value Date/Time   WBC 7.1 04/03/2018 1449   RBC 4.99 04/03/2018 1449   HGB 12.9 (L) 04/05/2018 0710   HCT 38.0 (L) 04/05/2018 0710   PLT 147.0 (L) 04/03/2018 1449   MCV 76.1 (L) 04/03/2018 1449   MCH 23.6 (L) 08/10/2017 0748   MCHC 32.4 04/03/2018 1449   RDW 15.0 04/03/2018 1449   LYMPHSABS 2.3 04/03/2018 1449   MONOABS 0.8 04/03/2018 1449   EOSABS 0.1 04/03/2018 1449   BASOSABS 0.1 04/03/2018 1449    Chest Imaging: 03/27/2018 CT super D chest: Multiple bilateral pulmonary nodules.  Most concerning of the nodules including the medial right lower lobe peribronchovascular nodule.  Also small nodule in the right upper lobe.  06/12/2018 CT chest: Stable irregular nodules over the right infrahilar region and medial right lower lobe measuring 1 x 1.2 cm. The patient's images have been independently reviewed by me.    Pulmonary Functions Testing Results: 12/01/2017 full PFTs: Attempted, patient was unable to cooperate with exam.  FeNO: None   Pathology: None   Echocardiogram:  Study Conclusions  - Left ventricle: The cavity size was normal. Systolic function was   normal. The estimated ejection fraction was in the range of 60%   to 65%. Wall motion was normal; there were no regional wall   motion abnormalities. Doppler parameters are consistent with   abnormal left ventricular relaxation (grade 1 diastolic   dysfunction). Doppler parameters are consistent with   indeterminate ventricular filling pressure. - Aortic valve: Transvalvular velocity was within the normal range.   There was no stenosis.  There was no  regurgitation. - Mitral valve: Transvalvular velocity was within the normal range.   There was no evidence for stenosis. There was no regurgitation. - Right ventricle: The cavity size was normal. Wall thickness was   normal. Systolic function was normal. - Tricuspid valve: There was mild regurgitation. - Pulmonary arteries: Systolic pressure was within the normal   range. PA peak pressure: 26 mm Hg (S).  Heart Catheterization: None    Assessment & Plan:   Right upper lobe pulmonary nodule  Right lower lobe pulmonary nodule  Multiple pulmonary nodules - Plan: Ambulatory referral to Family Practice  Pulmonary emphysema, unspecified emphysema type ([place]) - Plan: Ambulatory referral to Family Practice  Tobacco abuse - Plan: Ambulatory referral to Family Practice  Solitary pulmonary nodule - Plan: CT CHEST WO CONTRAST  Discussion:  Patient here today for follow-up regarding his lung nodules.  Recent CT scan with stability of his nodules over the past 3 months.  Emphysema and COPD currently managed with 3 of and as needed albuterol.  Unfortunately he is still smoking.  Today I spent greater than 10 minutes the office visit discussing smoking cessation as well as different techniques for quitting.  He is fortunately down to 1 to 2 cigarettes/day.  We will recommend the following: A repeat noncontrasted CT of the chest in 3 to 4 months.  This should be scheduled sometime in April. We will have the patient follow-up with 1 of our nurse practitioners to review the CT imaging. Patient needs to establish care with primary care provider.  This was excessive and today in the office. Place referral to community health and wellness.  Greater than 50% of this patient's 25-minute office visit was spent face-to-face counseling on above recommendation treatment.    Current Outpatient Medications:  .  albuterol (PROVENTIL HFA;VENTOLIN HFA) 108 (90 Base) MCG/ACT inhaler, Inhale 2 puffs  into the lungs every 6 (six) hours as needed for wheezing or shortness of breath., Disp: 1 Inhaler, Rfl: 0 .  feeding supplement, ENSURE ENLIVE, (ENSURE ENLIVE) LIQD, Take 237 mLs by mouth 3 (three) times daily between meals., Disp: 90 Bottle, Rfl: 0 .  tiotropium (SPIRIVA HANDIHALER) 18 MCG inhalation capsule, Place 1 capsule (18 mcg total) into inhaler and inhale daily., Disp: 90 capsule, Rfl: 1   Garner Nash, DO [facility] 06/27/2018 11:18 AM

## 2018-09-27 ENCOUNTER — Telehealth: Payer: Self-pay

## 2018-09-27 ENCOUNTER — Ambulatory Visit: Payer: Self-pay | Admitting: Pulmonary Disease

## 2018-09-27 NOTE — Telephone Encounter (Signed)
I do nto know how much time I will have in the midst of covid to do this. If Icard did not do it 09/27/2018 - please put on my desk and I can do next week

## 2018-09-27 NOTE — Telephone Encounter (Signed)
Spoke with pt, he would like to know if he can have Dr. Chase Caller fill out the social security disability paperwork. I explained to him that the papers would be sent to [employer] to fill out if the doctor agrees to fill the papers out. Dr. Valeta Harms thinks that he should have his PCP fill this out but his PCP is listed as Dr. Chase Caller. He has an appt today at 3:00pm with Dr. Valeta Harms and I was letting him know that he would not be able to fill those papers out. MR please advise.   Please call [phone]

## 2018-09-28 NOTE — Telephone Encounter (Signed)
Called patient patient unable to reach [facility]

## 2018-09-28 NOTE — Telephone Encounter (Signed)
Pt is returning call. Cb is [phone].

## 2018-09-28 NOTE — Telephone Encounter (Signed)
Unable to reach patient. LMTCB 

## 2018-10-02 NOTE — Telephone Encounter (Signed)
LMTCB

## 2018-10-24 ENCOUNTER — Telehealth: Payer: Self-pay | Admitting: Pulmonary Disease

## 2018-10-24 NOTE — Telephone Encounter (Signed)
Spoke w/ pt regarding CT scan. According to BI's last office note from 06/27/2018, CT scan was ordered but never scheduled. After CT scan, pt was supposed to f/u w/ NP to review results. I informed pt that as of now, CT scans are not being scheduled unless they are urgent. Also assessed pt for acute symptoms, which he denied.   Beth, please advise if this can be scheduled at a later date or if this is urgent. Thank you.

## 2018-10-24 NOTE — Telephone Encounter (Signed)
Called and spoke w/ pt regarding Beth's recommendations. Pt verbalized understanding with no additional questions. Will f/u w/ PCCs to get CT scan scheduled in 1-2 months (around May-June). Pt agreed to setting up f/u appt w/ NP on 12/14/2018 at 0900 to discuss CT results.   PCCs please schedule CT shortly before pt's OV on 12/14/2018.

## 2018-10-24 NOTE — Telephone Encounter (Signed)
Can push off CT chest 1-2 months max. May-June at the latest. Needs follow up after that  CC: Dr. Valeta Harms for [facility]

## 2018-10-25 ENCOUNTER — Telehealth: Payer: Self-pay | Admitting: Pulmonary Disease

## 2018-10-25 NOTE — Telephone Encounter (Signed)
?   Who called him? [place]? LMTCB

## 2018-10-26 NOTE — Telephone Encounter (Signed)
Spoke to pt his chest ct is 12/11/18@11 :30am @[facility]  [facility] pt is aware and then his ov is with Tammy Parrett 12/14/18 Joellen [place]

## 2018-10-27 NOTE — Telephone Encounter (Signed)
Attempted to call pt but unable to reach. Left message for pt to return call. 

## 2018-10-30 NOTE — Telephone Encounter (Signed)
Attempted to call patient today regarding results. I have left a voicemail message for pt to return call. X3 We have left three messages for pt to call our office back regarding results. No response at this time, a letter has been placed in mail today for pt to call our office. Mailed letter of communication today. Nothing further needed at this time. Closing message today, will open a new message when patient returns call.

## 2018-10-30 NOTE — Telephone Encounter (Signed)
Called and spoke with pt to see if he knew who had tried calling him the other day and pt stated he did not know. I looked at pt's appts and saw that he did have a CT scheduled 6/1 followed by televisit with TP 6/4. Asked pt if he needed anything from [place] prior to the appt to call our office and pt verbalized understanding. Nothing further needed.

## 2018-12-11 ENCOUNTER — Ambulatory Visit (INDEPENDENT_AMBULATORY_CARE_PROVIDER_SITE_OTHER)
Admission: RE | Admit: 2018-12-11 | Discharge: 2018-12-11 | Disposition: A | Discharge status: Home / Self Care | Payer: Medicaid Other | Source: Ambulatory Visit | Attending: Pulmonary Disease | Admitting: Pulmonary Disease

## 2018-12-11 ENCOUNTER — Other Ambulatory Visit: Payer: Self-pay

## 2018-12-11 DIAGNOSIS — R911 Solitary pulmonary nodule: Secondary | ICD-10-CM

## 2018-12-14 ENCOUNTER — Ambulatory Visit (INDEPENDENT_AMBULATORY_CARE_PROVIDER_SITE_OTHER): Payer: Medicaid Other | Admitting: Adult Health

## 2018-12-14 ENCOUNTER — Encounter: Payer: Self-pay | Admitting: Adult Health

## 2018-12-14 ENCOUNTER — Other Ambulatory Visit: Payer: Self-pay

## 2018-12-14 DIAGNOSIS — R911 Solitary pulmonary nodule: Secondary | ICD-10-CM

## 2018-12-14 NOTE — H&P (View-Only) (Signed)
Virtual Visit via Telephone Note  I connected with Tyler [name] on 12/14/18 at 12:00 PM EDT by telephone and verified that I am speaking with the correct person using two identifiers.  Location: Patient: Home  Provider: Office    I discussed the limitations, risks, security and privacy concerns of performing an evaluation and management service by telephone and the availability of in person appointments. I also discussed with the patient that there may be a patient responsible charge related to this service. The patient expressed understanding and agreed to proceed.   History of Present Illness: 58 year old male former  smoker seen for initial pulmonary consult January 2019 after hospitalization with influenza A with possible post viral Boop with clinical improvement after treatment of steroids.  CT chest in January 2019 showed a right apical 0.8 cm cavitary pulmonary nodule.  Extensive crazy paving pattern throughout the lungs.  And a bandlike consolidation and volume loss in the left lower lobe.  Follow-up CT chest May 2019 showed interval resolution of bilateral airspace disease and pleural effusions.  There were persistent bilateral pulmonary nodules largest in the right lower lobe measuring 13 mm.  This was increased in size.  A subsequent PET scan was done on Nov 29, 2017 that showed due to patient's risk factors a PET scan was done low-grade metabolic activity in the right lower lobe nodule.  And in the other nodules noted.  There were no hypermetabolic adenopathy.  Follow-up CT chest September 2019 showed similar appearance of multiple bilateral pulmonary nodules.  Patient was set up for a navigational bronchoscopy in September 2019 pathology was negative for malignant cells.  Right lower lobe aspirations were positive for scant granulomatous formation however were nondiagnostic.  Patient was set up for serial CT follow-ups.  CT chest December 2019 showed stable right lower lobe nodule at  1.2 cm.  Follow-up CT chest December 11, 2018 showed significant increase in the right lower lobe nodule now measuring 1.9 cm.  Unchanged small pulmonary nodules largest measuring 9 mm in the right apical lung.   Gets winded with high humidity . Can breath better when it is cool, stays inside in [facility]. No cough or wheezing . No hemoptysis. Says he has lost 30lbs in last 2 years. Weight is 145lb currently . Normal weight 2 years was 171 lbs.      Observations/Objective:  03/27/2018 CT super D chest: Multiple bilateral pulmonary nodules.  Most concerning of the nodules including the medial right lower lobe peribronchovascular nodule.  Also small nodule in the right upper lobe.  Pulmonary Functions Testing Results: 02/2018 -Lung function was normal (FEV1 85, ratio 72 , FVC 90%)    Echocardiogram:  Study Conclusions  - Left ventricle: The cavity size was normal. Systolic function was normal. The estimated ejection fraction was in the range of 60% to 65%. Wall motion was normal; there were no regional wall motion abnormalities. Doppler parameters are consistent with abnormal left ventricular relaxation (grade 1 diastolic dysfunction). Doppler parameters are consistent with indeterminate ventricular filling pressure. - Aortic valve: Transvalvular velocity was within the normal range. There was no stenosis. There was no regurgitation. - Mitral valve: Transvalvular velocity was within the normal range. There was no evidence for stenosis. There was no regurgitation. - Right ventricle: The cavity size was normal. Wall thickness was normal. Systolic function was normal. - Tricuspid valve: There was mild regurgitation. - Pulmonary arteries: Systolic pressure was within the normal range. PA peak pressure: 26 mm Hg (S).  Assessment  and Plan: RLL /infrahilar enlarging nodule in former smoker with weight loss . High suspicious for malignancy.  Previous PET scan in 2019 showed  low-grade metabolic activity.  ENB  was nondiagnostic.  As nodule has increased in size.  We will repeat PET scan to see if any distant disease is noted.  Previous spirometry and September 2019 showed normal lung function.  Plan  Patient Instructions  Please set up for PET Scan . , once test results are back I will call with results and next step in looking into your Lung nodule .  Please set up follow up on Dr. Chase Caller in 4-6 weeks , he requests to see Dr. Chase Caller for next follow up .  Please contact office for sooner follow up if symptoms do not improve or worsen or seek emergency care        Follow Up Instructions: Follow up in 4 -6 weeks and As needed   Please contact office for sooner follow up if symptoms do not improve or worsen or seek emergency care      I discussed the assessment and treatment plan with the patient. The patient was provided an opportunity to ask questions and all were answered. The patient agreed with the plan and demonstrated an understanding of the instructions.   The patient was advised to call back or seek an in-person evaluation if the symptoms worsen or if the condition fails to improve as anticipated.  I provided 28 minutes of non-face-to-face time during this encounter.   Rexene Edison, NP

## 2018-12-14 NOTE — Progress Notes (Signed)
Virtual Visit via Telephone Note  I connected with Tyler [name] on 12/14/18 at 12:00 PM EDT by telephone and verified that I am speaking with the correct person using two identifiers.  Location: Patient: Home  Provider: Office    I discussed the limitations, risks, security and privacy concerns of performing an evaluation and management service by telephone and the availability of in person appointments. I also discussed with the patient that there may be a patient responsible charge related to this service. The patient expressed understanding and agreed to proceed.   History of Present Illness: 58 year old male former  smoker seen for initial pulmonary consult January 2019 after hospitalization with influenza A with possible post viral Boop with clinical improvement after treatment of steroids.  CT chest in January 2019 showed a right apical 0.8 cm cavitary pulmonary nodule.  Extensive crazy paving pattern throughout the lungs.  And a bandlike consolidation and volume loss in the left lower lobe.  Follow-up CT chest May 2019 showed interval resolution of bilateral airspace disease and pleural effusions.  There were persistent bilateral pulmonary nodules largest in the right lower lobe measuring 13 mm.  This was increased in size.  A subsequent PET scan was done on Nov 29, 2017 that showed due to patient's risk factors a PET scan was done low-grade metabolic activity in the right lower lobe nodule.  And in the other nodules noted.  There were no hypermetabolic adenopathy.  Follow-up CT chest September 2019 showed similar appearance of multiple bilateral pulmonary nodules.  Patient was set up for a navigational bronchoscopy in September 2019 pathology was negative for malignant cells.  Right lower lobe aspirations were positive for scant granulomatous formation however were nondiagnostic.  Patient was set up for serial CT follow-ups.  CT chest December 2019 showed stable right lower lobe nodule at  1.2 cm.  Follow-up CT chest December 11, 2018 showed significant increase in the right lower lobe nodule now measuring 1.9 cm.  Unchanged small pulmonary nodules largest measuring 9 mm in the right apical lung.   Gets winded with high humidity . Can breath better when it is cool, stays inside in [facility]. No cough or wheezing . No hemoptysis. Says he has lost 30lbs in last 2 years. Weight is 145lb currently . Normal weight 2 years was 171 lbs.      Observations/Objective:  03/27/2018 CT super D chest: Multiple bilateral pulmonary nodules.  Most concerning of the nodules including the medial right lower lobe peribronchovascular nodule.  Also small nodule in the right upper lobe.  Pulmonary Functions Testing Results: 02/2018 -Lung function was normal (FEV1 85, ratio 72 , FVC 90%)    Echocardiogram:  Study Conclusions  - Left ventricle: The cavity size was normal. Systolic function was normal. The estimated ejection fraction was in the range of 60% to 65%. Wall motion was normal; there were no regional wall motion abnormalities. Doppler parameters are consistent with abnormal left ventricular relaxation (grade 1 diastolic dysfunction). Doppler parameters are consistent with indeterminate ventricular filling pressure. - Aortic valve: Transvalvular velocity was within the normal range. There was no stenosis. There was no regurgitation. - Mitral valve: Transvalvular velocity was within the normal range. There was no evidence for stenosis. There was no regurgitation. - Right ventricle: The cavity size was normal. Wall thickness was normal. Systolic function was normal. - Tricuspid valve: There was mild regurgitation. - Pulmonary arteries: Systolic pressure was within the normal range. PA peak pressure: 26 mm Hg (S).  Assessment  and Plan: RLL /infrahilar enlarging nodule in former smoker with weight loss . High suspicious for malignancy.  Previous PET scan in 2019 showed  low-grade metabolic activity.  ENB  was nondiagnostic.  As nodule has increased in size.  We will repeat PET scan to see if any distant disease is noted.  Previous spirometry and September 2019 showed normal lung function.  Plan  Patient Instructions  Please set up for PET Scan . , once test results are back I will call with results and next step in looking into your Lung nodule .  Please set up follow up on Dr. Chase Caller in 4-6 weeks , he requests to see Dr. Chase Caller for next follow up .  Please contact office for sooner follow up if symptoms do not improve or worsen or seek emergency care        Follow Up Instructions: Follow up in 4 -6 weeks and As needed   Please contact office for sooner follow up if symptoms do not improve or worsen or seek emergency care      I discussed the assessment and treatment plan with the patient. The patient was provided an opportunity to ask questions and all were answered. The patient agreed with the plan and demonstrated an understanding of the instructions.   The patient was advised to call back or seek an in-person evaluation if the symptoms worsen or if the condition fails to improve as anticipated.  I provided 28 minutes of non-face-to-face time during this encounter.   Rexene Edison, NP

## 2018-12-14 NOTE — Patient Instructions (Addendum)
Please set up for PET Scan . , once test results are back I will call with results and next step in looking into your Lung nodule .  Please set up follow up on Dr. Chase Caller in 4-6 weeks , he requests to see Dr. Chase Caller for next follow up .  Please contact office for sooner follow up if symptoms do not improve or worsen or seek emergency care

## 2018-12-21 ENCOUNTER — Encounter (HOSPITAL_COMMUNITY): Payer: Self-pay

## 2018-12-27 ENCOUNTER — Other Ambulatory Visit: Payer: Self-pay

## 2018-12-27 ENCOUNTER — Encounter (HOSPITAL_COMMUNITY)
Admission: RE | Admit: 2018-12-27 | Discharge: 2018-12-27 | Disposition: A | Discharge status: Home / Self Care | Payer: Medicaid Other | Source: Ambulatory Visit | Attending: Adult Health | Admitting: Adult Health

## 2018-12-27 DIAGNOSIS — R911 Solitary pulmonary nodule: Secondary | ICD-10-CM | POA: Diagnosis not present

## 2018-12-27 LAB — GLUCOSE, CAPILLARY: Glucose-Capillary: 72 mg/dL (ref 70–99)

## 2018-12-27 MED ORDER — FLUDEOXYGLUCOSE F - 18 (FDG) INJECTION
7.5800 | Freq: Once | INTRAVENOUS | Status: AC
  Administered 2018-12-27: 7.58 via INTRAVENOUS

## 2019-01-02 ENCOUNTER — Telehealth: Payer: Self-pay | Admitting: Adult Health

## 2019-01-02 NOTE — Telephone Encounter (Signed)
Spoke with patient. He is aware that TP is still awaiting BI's response. Advised him that TP would call him as soon as she heard back from him. He verbalized understanding.   Nothing further needed at time of call.

## 2019-01-02 NOTE — Telephone Encounter (Signed)
Spoke with patient. He stated that he was following up to see if TP had discussed doing a biopsy with BI. Advised him that I would check with TP and get back with him. He verbalized understanding.   TP, please advise. Thanks!

## 2019-01-02 NOTE — Telephone Encounter (Signed)
Please let patient know that I am waiting to hear back from Dr. Valeta Harms as soon as I do I will call him

## 2019-01-04 ENCOUNTER — Other Ambulatory Visit: Payer: Self-pay

## 2019-01-04 ENCOUNTER — Telehealth: Payer: Self-pay | Admitting: Pulmonary Disease

## 2019-01-04 DIAGNOSIS — R918 Other nonspecific abnormal finding of lung field: Secondary | ICD-10-CM

## 2019-01-04 NOTE — Telephone Encounter (Signed)
PCCM:  I called spoke with Mr. Ambs regarding his recent PET scan images.  We discussed the need for bronchoscopy and biopsy.  And the likely hood that he has a potential malignancy.  He would like to proceed with outpatient bronchoscopy.  He would like to meet with me in clinic to discuss the images and talk about the procedure again.  We will schedule him for an appointment on Thursday morning July 2.  Would like to have his bronchoscopy scheduled at the [facility] in [place] on July 3 to include EBUS plus ENB  We will also need to arrange a super D CT navigational image to be completed prior to the bronchoscopy on Friday.  [place]: Can you please help schedule his super D navigational CT as well as an appointment with me Thursday morning July 2 and bronchoscopy July 3.  Thanks to everyone helping coordinate his care.  [facility] 01/04/2019 4:25 PM

## 2019-01-04 NOTE — Telephone Encounter (Signed)
Call made to patient, per wife the patient is not home at the moment but she will have him to call us to set up the appt.

## 2019-01-04 NOTE — Telephone Encounter (Signed)
Will route message to [facility] for pre-procedure covid testing.

## 2019-01-04 NOTE — Progress Notes (Signed)
Order placed for Bronch.

## 2019-01-05 ENCOUNTER — Other Ambulatory Visit: Payer: Self-pay | Admitting: *Deleted

## 2019-01-05 ENCOUNTER — Telehealth: Payer: Self-pay | Admitting: *Deleted

## 2019-01-05 DIAGNOSIS — R918 Other nonspecific abnormal finding of lung field: Secondary | ICD-10-CM

## 2019-01-05 NOTE — Progress Notes (Signed)
Super D ordered.

## 2019-01-05 NOTE — Telephone Encounter (Addendum)

## 2019-01-05 NOTE — Telephone Encounter (Signed)
Called and spoke to patient.  Scheduled COVID testing and OV with Dr. Valeta Harms.   Routing to [place] as [place].  I didn't see bronch on the schedule yet.

## 2019-01-08 ENCOUNTER — Ambulatory Visit (INDEPENDENT_AMBULATORY_CARE_PROVIDER_SITE_OTHER)
Admission: RE | Admit: 2019-01-08 | Discharge: 2019-01-08 | Disposition: A | Discharge status: Home / Self Care | Payer: Medicaid Other | Source: Ambulatory Visit | Attending: Adult Health | Admitting: Adult Health

## 2019-01-08 ENCOUNTER — Other Ambulatory Visit: Payer: Self-pay

## 2019-01-08 ENCOUNTER — Other Ambulatory Visit (HOSPITAL_COMMUNITY)
Admission: RE | Admit: 2019-01-08 | Discharge: 2019-01-08 | Disposition: A | Discharge status: Home / Self Care | Payer: HRSA Program | Source: Ambulatory Visit | Attending: Pulmonary Disease | Admitting: Pulmonary Disease

## 2019-01-08 DIAGNOSIS — Z01812 Encounter for preprocedural laboratory examination: Secondary | ICD-10-CM | POA: Insufficient documentation

## 2019-01-08 DIAGNOSIS — R918 Other nonspecific abnormal finding of lung field: Secondary | ICD-10-CM

## 2019-01-08 DIAGNOSIS — Z1159 Encounter for screening for other viral diseases: Secondary | ICD-10-CM | POA: Diagnosis not present

## 2019-01-08 LAB — SARS CORONAVIRUS 2 (TAT 6-24 HRS): SARS Coronavirus 2: NEGATIVE

## 2019-01-10 ENCOUNTER — Encounter (HOSPITAL_COMMUNITY): Payer: Self-pay | Admitting: *Deleted

## 2019-01-10 ENCOUNTER — Other Ambulatory Visit: Payer: Self-pay

## 2019-01-10 ENCOUNTER — Ambulatory Visit: Payer: Self-pay | Admitting: Pulmonary Disease

## 2019-01-10 DIAGNOSIS — Z720 Tobacco use: Secondary | ICD-10-CM | POA: Insufficient documentation

## 2019-01-10 DIAGNOSIS — R590 Localized enlarged lymph nodes: Secondary | ICD-10-CM | POA: Insufficient documentation

## 2019-01-10 NOTE — Progress Notes (Deleted)
Synopsis: Referred in September 2019 for navigational bronchoscopy by Brand Males, MD  Subjective:   PATIENT ID: Tyler [name] GENDER: male DOB: 05-17-1961, MRN: [id number]  No chief complaint on file.   Initial OV: This is a 58 year old male with a past medical history of influenza A diagnosed in January 2019 admitted to the hospital at the time there was question of post viral Boop and was started on steroids.  He is followed by Dr. Chase Caller in the pulmonary clinic.  Patient has a medical history of alcohol abuse, chronic back pain, tobacco abuse with approximately 40-pack-year history of smoking.  Subsequent follow-up CT imaging initially in January 2019 revealed a left upper lobe 8 mm nodule.  Follow-up imaging revealed low-grade PET uptake.  There was low-grade PET uptake SQ the 2.1 within the right lower lobe medial nodule.  Patient has no complaints today in the office.  He is down to smoking a few cigarettes per week.  He does state he has lost a significant amount of weight of approximately 40 pounds since January.   OV 04/19/2018: The patient electromagnetic navigational bronchoscopy on 04/05/2018.  We sampled the right lower lobe medial nodule as well as the right upper lobe nodule.  All pathologic samples from the bronchoscopy were negative for malignancy.  The right lower lobe needle aspirations were positive for scant granulomatous formation however was nondiagnostic. He is currently down to 1 cigarette per day. Currently breathing is doing ok. He has been using his spiriva. His inhalers are expensive. He is concerned about that. He is otherwise complaint free today from a respiratory standpoint.   OV 06/27/2018: Patient seen today for follow-up regarding his recent CT imaging of his lung nodules.  Reviewing the CT images today in the office with the patient both nodules are stable in comparison to his previous imaging prior to bronchoscopy.  Patient is very relieved.  He has been  anxious about this for some time.  He has noticed some changes in his breathing.  Unfortunately he has not been able to quit smoking.  He is currently smoking 1 to 2 cigarettes/day.  Mainly this is just due to habit.  He has been using his albuterol inhaler at nighttime.  Only using the albuterol a few times a week.  He still regularly uses his Spiriva.  He does need refills of this medication today.  OV 01/10/2019: Patient seen today in follow-up regarding his lung nodules.  Recent pet imaging which revealed PET avid right lower lobe lesion as well as some small mediastinal adenopathy.  Today's visit is to review images ask any questions and review the risk, benefits and alternatives of planning for repeat outpatient bronchoscopy. ***   Past Medical History:  Diagnosis Date  . Alcohol abuse   . Chronic back pain   . Lung nodule   . Multiple pulmonary nodules 04/02/2018  . Pneumonia   . Right lower lobe pulmonary nodule 04/02/2018   12/01/2017: PET SUV 2.1  . Tobacco abuse      Family History  Problem Relation Age of Onset  . Heart attack Maternal Grandfather      Past Surgical History:  Procedure Laterality Date  . APPENDECTOMY    . VIDEO BRONCHOSCOPY WITH ENDOBRONCHIAL NAVIGATION N/A 04/05/2018   Procedure: VIDEO BRONCHOSCOPY WITH ENDOBRONCHIAL NAVIGATION;  Surgeon: Garner Nash, DO;  Location: [facility];  Service: Thoracic;  Laterality: N/A;  . WISDOM TOOTH EXTRACTION      Social History   Socioeconomic History  .  Marital status: Married    Spouse name: Not on file  . Number of children: Not on file  . Years of education: Not on file  . Highest education level: Not on file  Occupational History  . Not on file  Social Needs  . Financial resource strain: Not on file  . Food insecurity    Worry: Not on file    Inability: Not on file  . Transportation needs    Medical: Not on file    Non-medical: Not on file  Tobacco Use  . Smoking status: Current Every Day Smoker     Packs/day: 1.00    Years: 40.00    Pack years: 40.00    Types: Cigarettes  . Smokeless tobacco: Never Used  . Tobacco comment: 1 cigarette/day 04/03/2018  Substance and Sexual Activity  . Alcohol use: Not Currently  . Drug use: Not Currently    Types: Marijuana  . Sexual activity: Not on file  Lifestyle  . Physical activity    Days per week: Not on file    Minutes per session: Not on file  . Stress: Not on file  Relationships  . Social Herbalist on phone: Not on file    Gets together: Not on file    Attends religious service: Not on file    Active member of club or organization: Not on file    Attends meetings of clubs or organizations: Not on file    Relationship status: Not on file  . Intimate partner violence    Fear of current or ex partner: Not on file    Emotionally abused: Not on file    Physically abused: Not on file    Forced sexual activity: Not on file  Other Topics Concern  . Not on file  Social History Narrative  . Not on file     No Known Allergies   Outpatient Medications Prior to Visit  Medication Sig Dispense Refill  . albuterol (PROVENTIL HFA;VENTOLIN HFA) 108 (90 Base) MCG/ACT inhaler Inhale 2 puffs into the lungs every 6 (six) hours as needed for wheezing or shortness of breath. 1 Inhaler 3  . feeding supplement, ENSURE ENLIVE, (ENSURE ENLIVE) LIQD Take 237 mLs by mouth 3 (three) times daily between meals. (Patient taking differently: Take 237 mLs by mouth daily. ) 90 Bottle 0  . tiotropium (SPIRIVA HANDIHALER) 18 MCG inhalation capsule Place 1 capsule (18 mcg total) into inhaler and inhale daily. 90 capsule 3   No facility-administered medications prior to visit.     ROS   Objective:  Physical Exam  There were no vitals filed for this visit.   on  RA BMI Readings from Last 3 Encounters:  06/27/18 18.36 kg/m  04/19/18 18.31 kg/m  04/05/18 17.72 kg/m   Wt Readings from Last 3 Encounters:  06/27/18 143 lb (64.9 kg)  04/19/18  142 lb 9.6 oz (64.7 kg)  04/05/18 138 lb (62.6 kg)     CBC    Component Value Date/Time   WBC 7.1 04/03/2018 1449   RBC 4.99 04/03/2018 1449   HGB 12.9 (L) 04/05/2018 0710   HCT 38.0 (L) 04/05/2018 0710   PLT 147.0 (L) 04/03/2018 1449   MCV 76.1 (L) 04/03/2018 1449   MCH 23.6 (L) 08/10/2017 0748   MCHC 32.4 04/03/2018 1449   RDW 15.0 04/03/2018 1449   LYMPHSABS 2.3 04/03/2018 1449   MONOABS 0.8 04/03/2018 1449   EOSABS 0.1 04/03/2018 1449   BASOSABS 0.1 04/03/2018 1449  Chest Imaging: 03/27/2018 CT super D chest: Multiple bilateral pulmonary nodules.  Most concerning of the nodules including the medial right lower lobe peribronchovascular nodule.  Also small nodule in the right upper lobe.  06/12/2018 CT chest: Stable irregular nodules over the right infrahilar region and medial right lower lobe measuring 1 x 1.2 cm. The patient's images have been independently reviewed by me.    12/27/2018 nuclear medicine pet imaging: Dominant 1.9 cm right lower lobe nodule hypermetabolic on PET scan concerning for primary neoplasm.  8 mm short axis subcarinal lymph node with hypermetabolism concerning for metastatic disease to the subcarina.  Additional there is small tiny cavitary nodules that are new since Nov 29, 2017. The patient's images have been independently reviewed by me.    01/08/2019: Super D CT imaging of the chest, obtained for electromagnetic navigational bronchoscopy mapping.  Images unchanged since PET scan with predominant right lower lobe nodule concerning for primary bronchogenic carcinoma.  Pulmonary Functions Testing Results: 12/01/2017 full PFTs: Attempted, patient was unable to cooperate with exam.  FeNO: None   Pathology: None   Echocardiogram:  Study Conclusions  - Left ventricle: The cavity size was normal. Systolic function was   normal. The estimated ejection fraction was in the range of 60%   to 65%. Wall motion was normal; there were no regional wall    motion abnormalities. Doppler parameters are consistent with   abnormal left ventricular relaxation (grade 1 diastolic   dysfunction). Doppler parameters are consistent with   indeterminate ventricular filling pressure. - Aortic valve: Transvalvular velocity was within the normal range.   There was no stenosis. There was no regurgitation. - Mitral valve: Transvalvular velocity was within the normal range.   There was no evidence for stenosis. There was no regurgitation. - Right ventricle: The cavity size was normal. Wall thickness was   normal. Systolic function was normal. - Tricuspid valve: There was mild regurgitation. - Pulmonary arteries: Systolic pressure was within the normal   range. PA peak pressure: 26 mm Hg (S).  Heart Catheterization: None    Assessment & Plan:     ICD-10-CM   1. Right lower lobe pulmonary nodule  R91.1   2. Right upper lobe pulmonary nodule  R91.1   3. Tobacco abuse  Z72.0    Discussion:  Today in the office we reviewed his nuclear medicine PET scan imaging as well as his super D CT imaging of the chest.  I believe the next best step would to be proceed with outpatient bronchoscopy.  Today in the office we discussed the details of the bronchoscopy which would include endobronchial ultrasound-guided transbronchial needle aspiration biopsies of the enlarged station 7 lymph node as well as electromagnetic navigational bronchoscopy to the right lower lobe lesion and radial endobronchial ultrasound-guided transbronchial needle aspirations needles brushings and forceps biopsies to help obtain diagnosis of what we presume is a primary bronchogenic carcinoma.  Today in the office we discussed the risk, benefits and alternatives of proceeding with this procedure.  Patient is agreeable to proceed and is scheduled for tomorrow afternoon at the [facility] [place], [place]. We will also obtain preop labs today to include CBC, CMP, PT and INR.    Current Outpatient Medications:   .  albuterol (PROVENTIL HFA;VENTOLIN HFA) 108 (90 Base) MCG/ACT inhaler, Inhale 2 puffs into the lungs every 6 (six) hours as needed for wheezing or shortness of breath., Disp: 1 Inhaler, Rfl: 3 .  feeding supplement, ENSURE ENLIVE, (ENSURE ENLIVE) LIQD, Take 237 mLs  by mouth 3 (three) times daily between meals. (Patient taking differently: Take 237 mLs by mouth daily. ), Disp: 90 Bottle, Rfl: 0 .  tiotropium (SPIRIVA HANDIHALER) 18 MCG inhalation capsule, Place 1 capsule (18 mcg total) into inhaler and inhale daily., Disp: 90 capsule, Rfl: 3   Garner Nash, DO [facility] 01/10/2019 7:20 AM

## 2019-01-10 NOTE — Progress Notes (Signed)
Pt denies any acute pulmonary problems. Pt denies chest pain. Pt denies being under the care of a cardiologist. Pt denies having a stress test and cardiac cath. Pt denies having recent labs. Pt denies having an EKG and chest x ray within the last year. Pt made aware to stop taking vitamins, fish oil and herbal medications. Do not take any NSAIDs ie: Ibuprofen, Advil, Naproxen (Aleve), Motrin, BC and Goody Powder.  Pt denies that he and family members tested positive for COVID-19 ( pt reminded to quarantine). Pt denies that he and family members experienced the following symptoms:  Cough yes/no: No Fever (>100.60F)  yes/no: No Runny nose yes/no: No Sore throat yes/no: No Difficulty breathing/shortness of breath  yes/no: No (hx of chronic SOB; uses inhalers).  Pt reminded that hospital visitation restrictions are in effect and the importance of the restrictions.  Pt refused phone number to Short Stay in the event of an emergency. Pt stated that he would bring the name and telephone number of the responsible party on DOS ( ride/caregiver).  Pt verbalized understanding of all pre-op instructions.

## 2019-01-11 ENCOUNTER — Ambulatory Visit: Payer: Self-pay | Admitting: Pulmonary Disease

## 2019-01-11 ENCOUNTER — Ambulatory Visit (HOSPITAL_COMMUNITY): Payer: Medicaid Other

## 2019-01-11 ENCOUNTER — Encounter (HOSPITAL_COMMUNITY): Payer: Self-pay | Admitting: Surgery

## 2019-01-11 ENCOUNTER — Ambulatory Visit (HOSPITAL_COMMUNITY): Payer: Medicaid Other | Admitting: Anesthesiology

## 2019-01-11 ENCOUNTER — Encounter (HOSPITAL_COMMUNITY)
Admission: RE | Disposition: A | Discharge status: Home / Self Care | Payer: Self-pay | Source: Home / Self Care | Attending: Pulmonary Disease

## 2019-01-11 ENCOUNTER — Ambulatory Visit (HOSPITAL_COMMUNITY)
Admission: RE | Admit: 2019-01-11 | Discharge: 2019-01-11 | Disposition: A | Discharge status: Home / Self Care | Payer: Medicaid Other | Attending: Pulmonary Disease | Admitting: Pulmonary Disease

## 2019-01-11 DIAGNOSIS — D649 Anemia, unspecified: Secondary | ICD-10-CM | POA: Insufficient documentation

## 2019-01-11 DIAGNOSIS — J449 Chronic obstructive pulmonary disease, unspecified: Secondary | ICD-10-CM | POA: Diagnosis not present

## 2019-01-11 DIAGNOSIS — C3490 Malignant neoplasm of unspecified part of unspecified bronchus or lung: Secondary | ICD-10-CM

## 2019-01-11 DIAGNOSIS — R634 Abnormal weight loss: Secondary | ICD-10-CM | POA: Diagnosis not present

## 2019-01-11 DIAGNOSIS — Z79899 Other long term (current) drug therapy: Secondary | ICD-10-CM | POA: Insufficient documentation

## 2019-01-11 DIAGNOSIS — Z87891 Personal history of nicotine dependence: Secondary | ICD-10-CM | POA: Diagnosis not present

## 2019-01-11 DIAGNOSIS — R590 Localized enlarged lymph nodes: Secondary | ICD-10-CM | POA: Diagnosis present

## 2019-01-11 DIAGNOSIS — Z9889 Other specified postprocedural states: Secondary | ICD-10-CM

## 2019-01-11 DIAGNOSIS — R911 Solitary pulmonary nodule: Secondary | ICD-10-CM | POA: Diagnosis present

## 2019-01-11 DIAGNOSIS — Z419 Encounter for procedure for purposes other than remedying health state, unspecified: Secondary | ICD-10-CM

## 2019-01-11 DIAGNOSIS — E860 Dehydration: Secondary | ICD-10-CM | POA: Diagnosis not present

## 2019-01-11 DIAGNOSIS — E43 Unspecified severe protein-calorie malnutrition: Secondary | ICD-10-CM | POA: Diagnosis not present

## 2019-01-11 HISTORY — PX: VIDEO BRONCHOSCOPY WITH ENDOBRONCHIAL ULTRASOUND: SHX6177

## 2019-01-11 HISTORY — PX: VIDEO BRONCHOSCOPY WITH ENDOBRONCHIAL NAVIGATION: SHX6175

## 2019-01-11 HISTORY — DX: Dyspnea, unspecified: R06.00

## 2019-01-11 HISTORY — PX: VIDEO BRONCHOSCOPY WITH RADIAL ENDOBRONCHIAL ULTRASOUND: SHX6849

## 2019-01-11 HISTORY — DX: Other nonspecific abnormal finding of lung field: R91.8

## 2019-01-11 HISTORY — DX: Chronic obstructive pulmonary disease, unspecified: J44.9

## 2019-01-11 LAB — COMPREHENSIVE METABOLIC PANEL
ALT: 75 U/L — ABNORMAL HIGH (ref 0–44)
AST: 158 U/L — ABNORMAL HIGH (ref 15–41)
Albumin: 4.3 g/dL (ref 3.5–5.0)
Alkaline Phosphatase: 81 U/L (ref 38–126)
Anion gap: 12 (ref 5–15)
BUN: <5 mg/dL — ABNORMAL LOW (ref 6–20)
CO2: 27 mmol/L (ref 22–32)
Calcium: 8.9 mg/dL (ref 8.9–10.3)
Chloride: 97 mmol/L — ABNORMAL LOW (ref 98–111)
Creatinine, Ser: 0.76 mg/dL (ref 0.61–1.24)
GFR calc Af Amer: >60 mL/min (ref >60)
GFR calc non Af Amer: >60 mL/min (ref >60)
Glucose, Bld: 91 mg/dL (ref 70–99)
Potassium: 4.1 mmol/L (ref 3.5–5.1)
Sodium: 136 mmol/L (ref 135–145)
Total Bilirubin: 1 mg/dL (ref 0.3–1.2)
Total Protein: 7.8 g/dL (ref 6.5–8.1)

## 2019-01-11 LAB — CBC
HCT: 34.5 % — ABNORMAL LOW (ref 39.0–52.0)
Hemoglobin: 11.7 g/dL — ABNORMAL LOW (ref 13.0–17.0)
MCH: 25 pg — ABNORMAL LOW (ref 26.0–34.0)
MCHC: 33.9 g/dL (ref 30.0–36.0)
MCV: 73.7 fL — ABNORMAL LOW (ref 80.0–100.0)
Platelets: 133 10*3/uL — ABNORMAL LOW (ref 150–400)
RBC: 4.68 MIL/uL (ref 4.22–5.81)
RDW: 15 % (ref 11.5–15.5)
WBC: 5.7 10*3/uL (ref 4.0–10.5)
nRBC: 0.4 % — ABNORMAL HIGH (ref 0.0–0.2)

## 2019-01-11 LAB — PROTIME-INR
INR: 1.1 (ref 0.8–1.2)
Prothrombin Time: 14.1 seconds (ref 11.4–15.2)

## 2019-01-11 LAB — APTT: aPTT: 31 seconds (ref 24–36)

## 2019-01-11 SURGERY — BRONCHOSCOPY, WITH EBUS
Anesthesia: General

## 2019-01-11 MED ORDER — MIDAZOLAM HCL 2 MG/2ML IJ SOLN
INTRAMUSCULAR | Status: AC
  Filled 2019-01-11: qty 2

## 2019-01-11 MED ORDER — LIDOCAINE 2% (20 MG/ML) 5 ML SYRINGE
INTRAMUSCULAR | Status: DC | PRN
  Administered 2019-01-11: 60 mg via INTRAVENOUS

## 2019-01-11 MED ORDER — MEPERIDINE HCL 25 MG/ML IJ SOLN: 6.2500 mg | INTRAMUSCULAR | Status: DC | PRN

## 2019-01-11 MED ORDER — MIDAZOLAM HCL 5 MG/5ML IJ SOLN
INTRAMUSCULAR | Status: DC | PRN
  Administered 2019-01-11: 2 mg via INTRAVENOUS

## 2019-01-11 MED ORDER — HYDROMORPHONE HCL 1 MG/ML IJ SOLN: 0.2500 mg | INTRAMUSCULAR | Status: DC | PRN

## 2019-01-11 MED ORDER — LACTATED RINGERS IV SOLN
INTRAVENOUS | Status: DC | PRN
  Administered 2019-01-11 (×2): via INTRAVENOUS

## 2019-01-11 MED ORDER — FENTANYL CITRATE (PF) 250 MCG/5ML IJ SOLN
INTRAMUSCULAR | Status: AC
  Filled 2019-01-11: qty 5

## 2019-01-11 MED ORDER — SUCCINYLCHOLINE CHLORIDE 200 MG/10ML IV SOSY
PREFILLED_SYRINGE | INTRAVENOUS | Status: AC
  Filled 2019-01-11: qty 10

## 2019-01-11 MED ORDER — ROCURONIUM BROMIDE 10 MG/ML (PF) SYRINGE
PREFILLED_SYRINGE | INTRAVENOUS | Status: AC
  Filled 2019-01-11: qty 10

## 2019-01-11 MED ORDER — FENTANYL CITRATE (PF) 100 MCG/2ML IJ SOLN
INTRAMUSCULAR | Status: DC | PRN
  Administered 2019-01-11 (×2): 50 ug via INTRAVENOUS

## 2019-01-11 MED ORDER — LIDOCAINE 2% (20 MG/ML) 5 ML SYRINGE
INTRAMUSCULAR | Status: AC
  Filled 2019-01-11: qty 5

## 2019-01-11 MED ORDER — DEXAMETHASONE SODIUM PHOSPHATE 10 MG/ML IJ SOLN
INTRAMUSCULAR | Status: AC
  Filled 2019-01-11: qty 1

## 2019-01-11 MED ORDER — OXYCODONE HCL 5 MG/5ML PO SOLN: 5.0000 mg | Freq: Once | ORAL | Status: DC | PRN

## 2019-01-11 MED ORDER — PROPOFOL 10 MG/ML IV BOLUS
INTRAVENOUS | Status: DC | PRN
  Administered 2019-01-11: 140 mg via INTRAVENOUS

## 2019-01-11 MED ORDER — SUGAMMADEX SODIUM 200 MG/2ML IV SOLN
INTRAVENOUS | Status: DC | PRN
  Administered 2019-01-11: 200 mg via INTRAVENOUS

## 2019-01-11 MED ORDER — PROPOFOL 10 MG/ML IV BOLUS
INTRAVENOUS | Status: AC
  Filled 2019-01-11: qty 20

## 2019-01-11 MED ORDER — PHENYLEPHRINE 40 MCG/ML (10ML) SYRINGE FOR IV PUSH (FOR BLOOD PRESSURE SUPPORT)
PREFILLED_SYRINGE | INTRAVENOUS | Status: DC | PRN
  Administered 2019-01-11: 80 ug via INTRAVENOUS

## 2019-01-11 MED ORDER — ONDANSETRON HCL 4 MG/2ML IJ SOLN
INTRAMUSCULAR | Status: AC
  Filled 2019-01-11: qty 2

## 2019-01-11 MED ORDER — ONDANSETRON HCL 4 MG/2ML IJ SOLN
INTRAMUSCULAR | Status: DC | PRN
  Administered 2019-01-11: 4 mg via INTRAVENOUS

## 2019-01-11 MED ORDER — ROCURONIUM BROMIDE 50 MG/5ML IV SOSY
PREFILLED_SYRINGE | INTRAVENOUS | Status: DC | PRN
  Administered 2019-01-11 (×2): 10 mg via INTRAVENOUS
  Administered 2019-01-11: 40 mg via INTRAVENOUS

## 2019-01-11 MED ORDER — SUCCINYLCHOLINE CHLORIDE 200 MG/10ML IV SOSY
PREFILLED_SYRINGE | INTRAVENOUS | Status: DC | PRN
  Administered 2019-01-11: 120 mg via INTRAVENOUS

## 2019-01-11 MED ORDER — 0.9 % SODIUM CHLORIDE (POUR BTL) OPTIME
TOPICAL | Status: DC | PRN
  Administered 2019-01-11: 1000 mL

## 2019-01-11 MED ORDER — PROMETHAZINE HCL 25 MG/ML IJ SOLN: 6.2500 mg | INTRAMUSCULAR | Status: DC | PRN

## 2019-01-11 MED ORDER — OXYCODONE HCL 5 MG PO TABS: 5.0000 mg | ORAL_TABLET | Freq: Once | ORAL | Status: DC | PRN

## 2019-01-11 MED ORDER — DEXAMETHASONE SODIUM PHOSPHATE 10 MG/ML IJ SOLN
INTRAMUSCULAR | Status: DC | PRN
  Administered 2019-01-11: 10 mg via INTRAVENOUS

## 2019-01-11 MED ORDER — PHENYLEPHRINE 40 MCG/ML (10ML) SYRINGE FOR IV PUSH (FOR BLOOD PRESSURE SUPPORT)
PREFILLED_SYRINGE | INTRAVENOUS | Status: AC
  Filled 2019-01-11: qty 10

## 2019-01-11 SURGICAL SUPPLY — 53 items
ADAPTER BRONCHOSCOPE OLYMPUS (ADAPTER) ×5 IMPLANT
ADAPTER VALVE BIOPSY EBUS (MISCELLANEOUS) IMPLANT
ADPR BSCP OLMPS EDG (ADAPTER) ×2
ADPTR VALVE BIOPSY EBUS (MISCELLANEOUS)
BRUSH CYTOL CELLEBRITY 1.5X140 (MISCELLANEOUS) ×1 IMPLANT
BRUSH SUPERTRAX BIOPSY (INSTRUMENTS) IMPLANT
BRUSH SUPERTRAX NDL-TIP CYTO (INSTRUMENTS) ×5 IMPLANT
CANISTER SUCT 3000ML PPV (MISCELLANEOUS) ×3 IMPLANT
CHANNEL WORK EXTEND EDGE 180 (KITS) IMPLANT
CHANNEL WORK EXTEND EDGE 90 (KITS) IMPLANT
CONT SPEC 4OZ CLIKSEAL STRL BL (MISCELLANEOUS) ×3 IMPLANT
COVER BACK TABLE 60X90IN (DRAPES) ×3 IMPLANT
COVER WAND RF STERILE (DRAPES) ×3 IMPLANT
FILTER STRAW FLUID ASPIR (MISCELLANEOUS) IMPLANT
FORCEPS BIOP RJ4 1.8 (CUTTING FORCEPS) IMPLANT
FORCEPS BIOP SUPERTRX PREMAR (INSTRUMENTS) ×5 IMPLANT
GAUZE SPONGE 4X4 12PLY STRL (GAUZE/BANDAGES/DRESSINGS) ×3 IMPLANT
GLOVE SURG SS PI 7.5 STRL IVOR (GLOVE) ×6 IMPLANT
GOWN STRL REUS W/ TWL LRG LVL3 (GOWN DISPOSABLE) ×2 IMPLANT
GOWN STRL REUS W/TWL LRG LVL3 (GOWN DISPOSABLE) ×6
KIT CLEAN ENDO COMPLIANCE (KITS) ×6 IMPLANT
KIT LOCATABLE GUIDE (CANNULA) IMPLANT
KIT MARKER FIDUCIAL DELIVERY (KITS) IMPLANT
KIT PROCEDURE EDGE 180 (KITS) ×2 IMPLANT
KIT PROCEDURE EDGE 90 (KITS) IMPLANT
KIT TURNOVER KIT B (KITS) ×3 IMPLANT
MARKER SKIN DUAL TIP RULER LAB (MISCELLANEOUS) ×3 IMPLANT
NDL ASPIRATION VIZISHOT 19G (NEEDLE) IMPLANT
NDL ASPIRATION VIZISHOT 21G (NEEDLE) IMPLANT
NDL SUPERTRX PREMARK BIOPSY (NEEDLE) ×1 IMPLANT
NEEDLE ASPIRATION VIZISHOT 19G (NEEDLE) ×3 IMPLANT
NEEDLE ASPIRATION VIZISHOT 21G (NEEDLE) IMPLANT
NEEDLE SUPERTRX PREMARK BIOPSY (NEEDLE) ×9 IMPLANT
NS IRRIG 1000ML POUR BTL (IV SOLUTION) ×3 IMPLANT
OIL SILICONE PENTAX (PARTS (SERVICE/REPAIRS)) ×3 IMPLANT
PAD ARMBOARD 7.5X6 YLW CONV (MISCELLANEOUS) ×6 IMPLANT
PATCHES PATIENT (LABEL) ×9 IMPLANT
STOPCOCK 4 WAY LG BORE MALE ST (IV SETS) ×3 IMPLANT
SYR 20CC LL (SYRINGE) ×3 IMPLANT
SYR 20ML ECCENTRIC (SYRINGE) ×9 IMPLANT
SYR 3ML LL SCALE MARK (SYRINGE) IMPLANT
SYR 50ML SLIP (SYRINGE) ×3 IMPLANT
SYR 5ML LL (SYRINGE) ×3 IMPLANT
TOWEL GREEN STERILE FF (TOWEL DISPOSABLE) ×3 IMPLANT
TRAP SPECIMEN MUCOUS 40CC (MISCELLANEOUS) IMPLANT
TUBE CONNECTING 20'X1/4 (TUBING) ×1
TUBE CONNECTING 20X1/4 (TUBING) ×2 IMPLANT
TUBING EXTENTION W/L.L. (IV SETS) ×3 IMPLANT
UNDERPAD 30X30 (UNDERPADS AND DIAPERS) ×3 IMPLANT
VALVE BIOPSY  SINGLE USE (MISCELLANEOUS) ×2
VALVE BIOPSY SINGLE USE (MISCELLANEOUS) ×1 IMPLANT
VALVE SUCTION BRONCHIO DISP (MISCELLANEOUS) ×3 IMPLANT
WATER STERILE IRR 1000ML POUR (IV SOLUTION) ×3 IMPLANT

## 2019-01-11 NOTE — Telephone Encounter (Signed)
Bronch Scheduled. Patient aware of time and place. Nothing further is needed at this time.

## 2019-01-11 NOTE — Transfer of Care (Signed)
Immediate Anesthesia Transfer of Care Note  Patient: Tyler [name]  Procedure(s) Performed: VIDEO BRONCHOSCOPY WITH ENDOBRONCHIAL ULTRASOUND (N/A ) VIDEO BRONCHOSCOPY WITH ENDOBRONCHIAL NAVIGATION (N/A ) VIDEO BRONCHOSCOPY WITH RADIAL ENDOBRONCHIAL ULTRASOUND (N/A )  Patient Location: PACU  Anesthesia Type:General  Level of Consciousness: awake and patient cooperative  Airway & Oxygen Therapy: Patient Spontanous Breathing and Patient connected to face mask oxygen  Post-op Assessment: Report given to RN, Post -op Vital signs reviewed and stable and Patient moving all extremities X 4  Post vital signs: Reviewed and stable  Last Vitals:  Vitals Value Taken Time  BP 138/88 01/11/19 1735  Temp    Pulse 80 01/11/19 1736  Resp 16 01/11/19 1736  SpO2 100 % 01/11/19 1736  Vitals shown include unvalidated device data.  Last Pain:  Vitals:   01/11/19 1243  TempSrc:   PainSc: 0-No pain      Patients Stated Pain Goal: 3 (61/22/44 [id number])  Complications: No apparent anesthesia complications

## 2019-01-11 NOTE — Op Note (Signed)
Video Bronchoscopy with Endobronchial Ultrasound Procedure, Electromagnetic Navigation Procedure and Radial Peripheral Targeted Ultrasound Note  Date of Operation: 01/11/2019  Pre-op Diagnosis: Right lower lobe lung nodule, mediastinal adenopathy  Post-op Diagnosis: Right lower lobe lung nodule, mediastinal adenopathy  Surgeon: Garner Nash, DO   Assistants: None  Anesthesia: General endotracheal anesthesia  Operation: Flexible video fiberoptic bronchoscopy with electromagnetic navigation and biopsies.  Estimated Blood Loss: Minimal, less than 3 cc  Complications: None  Indications and History: Tyler [name] is a 58 y.o. male with progressively enlarging PET avid right lower lobe lung nodule and associated mediastinal station 7 adenopathy.  The risks, benefits, complications, treatment options and expected outcomes were discussed with the patient.  The possibilities of pneumothorax, pneumonia, reaction to medication, pulmonary aspiration, perforation of a viscus, bleeding, failure to diagnose a condition and creating a complication requiring transfusion or operation were discussed with the patient who freely signed the consent.    Description of Procedure: The patient was examined in the preoperative area and history and data from the preprocedure consultation were reviewed. It was deemed appropriate to proceed.  The patient was taken to [facility] OR 10, identified as Tyler [name] and the procedure verified as Flexible Video Fiberoptic Bronchoscopy.  A Time Out was held and the above information confirmed. After being taken to the operating room general anesthesia was initiated and the patient  was orally intubated. The video fiberoptic bronchoscope was introduced via the endotracheal tube and a general inspection was performed which showed scattered distal pitting of the airways and no evidence of endobronchial lesion. The standard scope was then withdrawn and the endobronchial ultrasound  was used to identify and characterize the peritracheal, hilar and bronchial lymph nodes. Inspection showed enlarged station 7 lymph node visible from the right mainstem medially. Using real-time ultrasound guidance Wang needle biopsies were take from Station 7 nodes and were sent for cytology.  Prior to the date of the procedure a high-resolution CT scan of the chest was performed. Utilizing [place] a virtual tracheobronchial tree was generated to allow the creation of distinct navigation pathways to the patient's parenchymal abnormalities. After being taken to the operating room general anesthesia was initiated and the patient  was orally intubated. The video fiberoptic bronchoscope was introduced via the endotracheal tube and a general inspection was performed. The extendable working channel and locator guide were introduced into the bronchoscope. The distinct navigation pathways prepared prior to this procedure were then utilized to navigate to within 5 mm of patient's lesion(s) identified on CT scan. The extendable working channel was secured into place and the locator guide was withdrawn.  Following removal of the locatable guide the Olympus radial endobronchial ultrasound was placed through the 180 degree catheter.  Using real-time radial ultrasound we were able to visualize the lesion in proximity to the distal tip of the locatable guide sheath. Under fluoroscopic guidance transbronchial needle brushings, transbronchial Wang needle biopsies, and transbronchial forceps biopsies were performed to be sent for cytology and pathology.  In between each sampling technique the locatable guide was reintroduced to affirm positioning as well as correlation with fluoroscopic imaging. A bronchioalveolar lavage was performed in the right lower lobe and sent for cytology. At the end of the procedure a general airway inspection was performed and there was no evidence of active bleeding. The bronchoscope was  removed.  The patient tolerated the procedure well. There was no significant blood loss and there were no obvious complications. A post-procedural chest x-ray is  pending.  Samples: 1. Wang needle biopsies from station 7 lymph node  1. Transbronchial needle brushings from right lower lobe 2. Transbronchial needle aspiration biopsies from right lower lobe 3. Transbronchial forceps biopsies from right lower lobe 4. Bronchoalveolar lavage from lower lobe  Plans:  The patient will be discharged from the PACU to home when recovered from anesthesia and after chest x-ray is reviewed. We will review the cytology, pathology and microbiology results with the patient when they become available. Outpatient followup will be with Octavio Graves Neal Trulson, DO.   Preliminary pathology: Station 7 adequate for tissue diagnosis   I will refer to medical oncology, Dr. Julien Nordmann. He will also need a MRI of the brain for completion of staging.  Garner Nash, DO [facility] 01/11/2019 5:39 PM  Personal pager: [phone] If unanswered, please page CCM On-call: [phone]

## 2019-01-11 NOTE — Interval H&P Note (Signed)
History and Physical Interval Note:  01/11/2019 1:10 PM  Tyler [name]  has presented today for surgery, with the diagnosis of LUNG MASS.  The various methods of treatment have been discussed with the patient and family. After consideration of risks, benefits and other options for treatment, the patient has consented to  Procedure(s): VIDEO BRONCHOSCOPY WITH ENDOBRONCHIAL ULTRASOUND (N/A) VIDEO BRONCHOSCOPY WITH ENDOBRONCHIAL NAVIGATION (N/A) VIDEO BRONCHOSCOPY WITH RADIAL ENDOBRONCHIAL ULTRASOUND (N/A) POSSIBLE FIDUCIAL PLACEMENT (N/A) as a surgical intervention.  The patient's history has been reviewed, patient examined, no change in status, stable for surgery.  I have reviewed the patient's chart and labs.  Questions were answered to the patient's satisfaction.     The patient was seen an examined in the pre-op holding area prior to planned outpatient bronchoscopy. He has had multiple lung nodules followed on CT imaging. Recent repeat PET imaging with avid station 7 node as well as RLL nodule. SuperD imaging was obtained with planned ENB bronchoscopy, EBUS as well as radial-EBUS and fiducial placement.   History reviewed   BP 136/87   Pulse 81   Temp 98.2 F (36.8 C) (Oral)   Resp 18   Ht 6\' 2"  (1.88 m)   Wt 63.5 kg   SpO2 100%   BMI 17.97 kg/m  Gen: elderly male, resting in bed, nad Heart: rrr, s1 s2 Lungs: diminished BL, no crackles, no wheeze Ext: no edema   Images reviewed as described above  A:  RLL lung nodule Mediastinal adenopathy  Weightloss - concerned for a primary bronchogenic carcinoma   P:  EBUS bronchoscopy with TBNA station 7  ENB, navigation plus radial EBUS to RLL nodule and possible fiducial placement  Patient freely signed consent today.  [place]

## 2019-01-11 NOTE — Discharge Instructions (Signed)
Flexible Bronchoscopy, Care After This sheet gives you information about how to care for yourself after your test. Your doctor may also give you more specific instructions. If you have problems or questions, contact your doctor. Follow these instructions at home: Eating and drinking  The day after the test, go back to your normal diet. Driving  Do not drive for 24 hours if you were given a medicine to help you relax (sedative).  Do not drive or use heavy machinery while taking prescription pain medicine. General instructions   Take over-the-counter and prescription medicines only as told by your doctor.  Return to your normal activities as told. Ask what activities are safe for you.  Do not use any products that have nicotine or tobacco in them. This includes cigarettes and e-cigarettes. If you need help quitting, ask your doctor.  Keep all follow-up visits as told by your doctor. This is important. It is very important if you had a tissue sample (biopsy) taken. Get help right away if:  You have shortness of breath that gets worse.  You get light-headed.  You feel like you are going to pass out (faint).  You have chest pain.  You cough up: ? More than a little blood. ? More blood than before. Summary  Do not eat or drink anything (not even water) for 2 hours after your test, or until your numbing medicine wears off.  Do not use cigarettes. Do not use e-cigarettes.  Get help right away if you have chest pain. This information is not intended to replace advice given to you by your health care provider. Make sure you discuss any questions you have with your health care provider. Document Released: 04/25/2009 Document Revised: 06/10/2017 Document Reviewed: 07/16/2016 Elsevier Patient Education  2020 [employer].

## 2019-01-11 NOTE — Anesthesia Procedure Notes (Signed)
Procedure Name: Intubation Date/Time: 01/11/2019 4:18 PM Performed by: Orlie Dakin, CRNA Pre-anesthesia Checklist: Patient identified, Emergency Drugs available, Suction available and Patient being monitored Patient Re-evaluated:Patient Re-evaluated prior to induction Oxygen Delivery Method: Circle system utilized Preoxygenation: Pre-oxygenation with 100% oxygen Induction Type: IV induction and Rapid sequence Laryngoscope Size: Glidescope and 4 Grade View: Grade I Tube type: Oral Tube size: 9.0 mm Number of attempts: 2 Airway Equipment and Method: Stylet and Video-laryngoscopy Placement Confirmation: ETT inserted through vocal cords under direct vision,  breath sounds checked- equal and bilateral and positive ETCO2 Secured at: 23 cm Tube secured with: Tape Dental Injury: Teeth and Oropharynx as per pre-operative assessment  Comments: RSI due to Covid-19 pandemic concerns.  1st DL, Miller 3, grade 4 view, anterior anatomy of glottic opening.  2nd DL with Glidescope, grade 1 and as noted above.

## 2019-01-11 NOTE — Anesthesia Preprocedure Evaluation (Signed)
Anesthesia Evaluation  Patient identified by MRN, date of birth, ID band Patient awake    Reviewed: Allergy & Precautions, NPO status , Patient's Chart, lab work & pertinent test results  Airway Mallampati: III  TM Distance: >3 FB Neck ROM: Full    Dental  (+) Poor Dentition, Chipped, Missing,    Pulmonary COPD,  COPD inhaler, Current Smoker,    Pulmonary exam normal breath sounds clear to auscultation       Cardiovascular negative cardio ROS Normal cardiovascular exam Rhythm:Regular Rate:Normal  ECG: SR, rate 88  ECHO: LV EF: 60% - 65%   Neuro/Psych PSYCHIATRIC DISORDERS negative neurological ROS     GI/Hepatic negative GI ROS, (+)     substance abuse  ,   Endo/Other  negative endocrine ROS  Renal/GU negative Renal ROS     Musculoskeletal negative musculoskeletal ROS (+)   Abdominal   Peds  Hematology  (+) anemia ,   Anesthesia Other Findings  LEFT UPPER LOBE LUNG MASS  Reproductive/Obstetrics                             Anesthesia Physical  Anesthesia Plan  ASA: III  Anesthesia Plan: General   Post-op Pain Management:    Induction: Intravenous  PONV Risk Score and Plan: 1 and Ondansetron and Treatment may vary due to age or medical condition  Airway Management Planned: Oral ETT  Additional Equipment:   Intra-op Plan:   Post-operative Plan: Extubation in OR  Informed Consent: I have reviewed the patients History and Physical, chart, labs and discussed the procedure including the risks, benefits and alternatives for the proposed anesthesia with the patient or authorized representative who has indicated his/her understanding and acceptance.     Dental advisory given  Plan Discussed with: CRNA  Anesthesia Plan Comments:         Anesthesia Quick Evaluation

## 2019-01-14 ENCOUNTER — Encounter (HOSPITAL_COMMUNITY): Payer: Self-pay | Admitting: Pulmonary Disease

## 2019-01-15 ENCOUNTER — Telehealth: Payer: Self-pay | Admitting: *Deleted

## 2019-01-15 DIAGNOSIS — R918 Other nonspecific abnormal finding of lung field: Secondary | ICD-10-CM

## 2019-01-15 NOTE — Telephone Encounter (Signed)
Oncology Nurse Navigator Documentation  Oncology Nurse Navigator Flowsheets 01/15/2019  Navigator Location [facility]  Referral Date to RadOnc/MedOnc 01/15/2019  Navigator Encounter Type Telephone/I received referral on Tyler [name] today.  I called him and scheduled him to be seen on 01/17/2019 arrive at 12:45.  He verbalized understanding of appt time and place.   Telephone Outgoing Call  Abnormal Finding Date 12/11/2018  Confirmed Diagnosis Date 01/11/2019  Treatment Phase Pre-Tx/Tx Discussion  Barriers/Navigation Needs Coordination of Care;Education  Education Other  Interventions Coordination of Care;Education  Coordination of Care Appts  Education Method Verbal  Acuity Level 2  Time Spent with Patient 30

## 2019-01-16 NOTE — Anesthesia Postprocedure Evaluation (Signed)
Anesthesia Post Note  Patient: Tyler [name]  Procedure(s) Performed: VIDEO BRONCHOSCOPY WITH ENDOBRONCHIAL ULTRASOUND (N/A ) VIDEO BRONCHOSCOPY WITH ENDOBRONCHIAL NAVIGATION (N/A ) VIDEO BRONCHOSCOPY WITH RADIAL ENDOBRONCHIAL ULTRASOUND (N/A )     Patient location during evaluation: PACU Anesthesia Type: General Level of consciousness: awake and alert Pain management: pain level controlled Vital Signs Assessment: post-procedure vital signs reviewed and stable Respiratory status: spontaneous breathing, nonlabored ventilation and respiratory function stable Cardiovascular status: blood pressure returned to baseline and stable Postop Assessment: no apparent nausea or vomiting Anesthetic complications: no    Last Vitals:  Vitals:   01/11/19 1735 01/11/19 1820  BP:    Pulse:    Resp:    Temp: (!) 36.3 C 36.7 C  SpO2:      Last Pain:  Vitals:   01/11/19 1820  TempSrc:   PainSc: 0-No pain   Pain Goal: Patients Stated Pain Goal: 3 (01/11/19 1243)                 Lynda Rainwater

## 2019-01-17 ENCOUNTER — Encounter: Payer: Self-pay | Admitting: Internal Medicine

## 2019-01-17 ENCOUNTER — Inpatient Hospital Stay (HOSPITAL_BASED_OUTPATIENT_CLINIC_OR_DEPARTMENT_OTHER): Payer: Medicaid Other | Admitting: Internal Medicine

## 2019-01-17 ENCOUNTER — Inpatient Hospital Stay: Payer: Medicaid Other | Attending: Internal Medicine

## 2019-01-17 ENCOUNTER — Other Ambulatory Visit: Payer: Self-pay

## 2019-01-17 VITALS — BP 126/93 | HR 81 | Temp 98.2°F | Resp 18 | Ht 74.0 in | Wt 136.6 lb

## 2019-01-17 DIAGNOSIS — K760 Fatty (change of) liver, not elsewhere classified: Secondary | ICD-10-CM | POA: Diagnosis not present

## 2019-01-17 DIAGNOSIS — F1010 Alcohol abuse, uncomplicated: Secondary | ICD-10-CM | POA: Diagnosis not present

## 2019-01-17 DIAGNOSIS — R110 Nausea: Secondary | ICD-10-CM | POA: Insufficient documentation

## 2019-01-17 DIAGNOSIS — Z5111 Encounter for antineoplastic chemotherapy: Secondary | ICD-10-CM | POA: Diagnosis not present

## 2019-01-17 DIAGNOSIS — R5383 Other fatigue: Secondary | ICD-10-CM | POA: Insufficient documentation

## 2019-01-17 DIAGNOSIS — I700 Atherosclerosis of aorta: Secondary | ICD-10-CM | POA: Diagnosis not present

## 2019-01-17 DIAGNOSIS — Z7189 Other specified counseling: Secondary | ICD-10-CM | POA: Insufficient documentation

## 2019-01-17 DIAGNOSIS — Z79899 Other long term (current) drug therapy: Secondary | ICD-10-CM

## 2019-01-17 DIAGNOSIS — R634 Abnormal weight loss: Secondary | ICD-10-CM | POA: Insufficient documentation

## 2019-01-17 DIAGNOSIS — M549 Dorsalgia, unspecified: Secondary | ICD-10-CM

## 2019-01-17 DIAGNOSIS — R05 Cough: Secondary | ICD-10-CM | POA: Diagnosis not present

## 2019-01-17 DIAGNOSIS — Z8249 Family history of ischemic heart disease and other diseases of the circulatory system: Secondary | ICD-10-CM | POA: Insufficient documentation

## 2019-01-17 DIAGNOSIS — F17210 Nicotine dependence, cigarettes, uncomplicated: Secondary | ICD-10-CM | POA: Insufficient documentation

## 2019-01-17 DIAGNOSIS — G8929 Other chronic pain: Secondary | ICD-10-CM | POA: Insufficient documentation

## 2019-01-17 DIAGNOSIS — R0789 Other chest pain: Secondary | ICD-10-CM

## 2019-01-17 DIAGNOSIS — R0602 Shortness of breath: Secondary | ICD-10-CM

## 2019-01-17 DIAGNOSIS — R072 Precordial pain: Secondary | ICD-10-CM | POA: Diagnosis not present

## 2019-01-17 DIAGNOSIS — C3491 Malignant neoplasm of unspecified part of right bronchus or lung: Secondary | ICD-10-CM | POA: Insufficient documentation

## 2019-01-17 DIAGNOSIS — J449 Chronic obstructive pulmonary disease, unspecified: Secondary | ICD-10-CM | POA: Insufficient documentation

## 2019-01-17 DIAGNOSIS — R918 Other nonspecific abnormal finding of lung field: Secondary | ICD-10-CM

## 2019-01-17 DIAGNOSIS — C3431 Malignant neoplasm of lower lobe, right bronchus or lung: Secondary | ICD-10-CM

## 2019-01-17 LAB — CBC WITH DIFFERENTIAL (CANCER CENTER ONLY)
Abs Immature Granulocytes: 0.03 K/uL (ref 0.00–0.07)
Basophils Absolute: 0.1 K/uL (ref 0.0–0.1)
Basophils Relative: 1 %
Eosinophils Absolute: 0 K/uL (ref 0.0–0.5)
Eosinophils Relative: 0 %
HCT: 36.9 % — ABNORMAL LOW (ref 39.0–52.0)
Hemoglobin: 12.4 g/dL — ABNORMAL LOW (ref 13.0–17.0)
Immature Granulocytes: 0 %
Lymphocytes Relative: 38 %
Lymphs Abs: 3 K/uL (ref 0.7–4.0)
MCH: 24.6 pg — ABNORMAL LOW (ref 26.0–34.0)
MCHC: 33.6 g/dL (ref 30.0–36.0)
MCV: 73.1 fL — ABNORMAL LOW (ref 80.0–100.0)
Monocytes Absolute: 0.8 K/uL (ref 0.1–1.0)
Monocytes Relative: 11 %
Neutro Abs: 3.9 K/uL (ref 1.7–7.7)
Neutrophils Relative %: 50 %
Platelet Count: 127 K/uL — ABNORMAL LOW (ref 150–400)
RBC: 5.05 MIL/uL (ref 4.22–5.81)
RDW: 15.4 % (ref 11.5–15.5)
WBC Count: 7.8 K/uL (ref 4.0–10.5)
nRBC: 0.9 % — ABNORMAL HIGH (ref 0.0–0.2)

## 2019-01-17 LAB — CMP (CANCER CENTER ONLY)
ALT: 72 U/L — ABNORMAL HIGH (ref 0–44)
AST: 101 U/L — ABNORMAL HIGH (ref 15–41)
Albumin: 4.2 g/dL (ref 3.5–5.0)
Alkaline Phosphatase: 85 U/L (ref 38–126)
Anion gap: 11 (ref 5–15)
BUN: 5 mg/dL — ABNORMAL LOW (ref 6–20)
CO2: 29 mmol/L (ref 22–32)
Calcium: 8.6 mg/dL — ABNORMAL LOW (ref 8.9–10.3)
Chloride: 98 mmol/L (ref 98–111)
Creatinine: 0.75 mg/dL (ref 0.61–1.24)
GFR, Est AFR Am: >60 mL/min
GFR, Estimated: >60 mL/min
Glucose, Bld: 92 mg/dL (ref 70–99)
Potassium: 4 mmol/L (ref 3.5–5.1)
Sodium: 138 mmol/L (ref 135–145)
Total Bilirubin: 1.1 mg/dL (ref 0.3–1.2)
Total Protein: 7.9 g/dL (ref 6.5–8.1)

## 2019-01-17 NOTE — Progress Notes (Signed)
[place] Telephone:(336) [phone]   [phone]) [phone]  CONSULT NOTE  REFERRING PHYSICIAN: Dr. Leory Plowman Icard  REASON FOR CONSULTATION:  58 years old African-American male recently diagnosed with lung cancer.  HPI Tyler [name] is a 58 y.o. male with past medical history significant for alcohol abuse, COPD, chronic back pain as well as long history of smoking.  The patient mentioned that around 2 years ago he was diagnosed with pneumonia and during his evaluation he had CT scan of the chest performed on August 10, 2017 and that showed apical right upper lobe 0.8 cm cavitary pulmonary nodule and a small bronchogenic carcinoma was not excluded.  The patient was followed by several scan as well as PET scan on 11/29/2017 and that showed low-grade metabolic activity in the distinctly marginated broncho-vascular nodule medially in the right lower lobe with maximum SUV of 2.1.  He had repeat CT scan of the chest on 03/27/2018 that showed similar appearance of multiple bilateral pulmonary nodules including the low-grade metabolically active lesion in the infrahilar right lower lobe seen on the previous PET scan.  On 04/05/2018 the patient underwent bronchoscopy under the care of Dr. Valeta Harms but it was not conclusive for malignancy.  He is followed by observation and repeat imaging studies at regular basis.  CT scan of the chest on 12/11/2018 showed spiculated pulmonary nodule of the infrahilar right lung has significantly increased in size and measured 1.9 x 1.8 cm highly concerning for malignancy.  There was additional small pulmonary nodules including 0.6 cm nodule in the right lower lobe, 0.9 cm nodule of the right pulmonary apex that was unchanged.  Repeat PET scan on 12/27/2018 showed dominant infrahilar 1.9 cm right lower lobe nodule markedly hypermetabolic consistent with neoplasm.  There was also a 0.8 cm short axis subcarinal lymph node with hypermetabolism concerning for metastatic  disease.  There was tiny cavitary nodules identified in both upper lungs new since Nov 29, 2017.  1 of the larger examples of these nodules is in the posterior left upper lobe and measured 0.5 cm.  The patient underwent repeat bronchoscopy under the care of Dr. Valeta Harms on 01/11/2019 and the final pathology ([id number]) showed malignant cells consistent with non-small cell carcinoma. Immunohistochemistry is positive for TTF-1. Cytokeratin 5/6, S100, and MelanA are negative. The immunoprofile is consistent with a lung adenocarcinoma. Dr. Valeta Harms kindly referred the patient to me today for evaluation and recommendation regarding treatment of his condition. When seen today he is feeling fine except for intermittent substernal chest pain and mild cough but no significant shortness of breath or hemoptysis.  He lost around 30 pounds in the last 2 years.  He denied having any nausea, vomiting, diarrhea or constipation.  The patient denied having any headache or visual changes.  He is scheduled for MRI of the brain tomorrow. Family history significant for father who was killed and mother died from alcoholic complications. The patient is married and has 2 children his son and daughter.  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  He has a history of smoking less than 1 pack/day for around 42 years and quit 2 years ago.  He also drinks around 6 packs of beer every day.  He has no history of drug abuse.  HPI  Past Medical History:  Diagnosis Date   Alcohol abuse    Chronic back pain    COPD (chronic obstructive pulmonary disease) (HCC)  Dyspnea    Lung mass    mediastinal adenopathy   Lung nodule    Multiple pulmonary nodules 04/02/2018   Pneumonia    Right lower lobe pulmonary nodule 04/02/2018   12/01/2017: PET SUV 2.1   Tobacco abuse     Past Surgical History:  Procedure Laterality Date   APPENDECTOMY     VIDEO BRONCHOSCOPY WITH ENDOBRONCHIAL  NAVIGATION N/A 04/05/2018   Procedure: VIDEO BRONCHOSCOPY WITH ENDOBRONCHIAL NAVIGATION;  Surgeon: Garner Nash, DO;  Location: [facility];  Service: Thoracic;  Laterality: N/A;   VIDEO BRONCHOSCOPY WITH ENDOBRONCHIAL NAVIGATION N/A 01/11/2019   Procedure: VIDEO BRONCHOSCOPY WITH ENDOBRONCHIAL NAVIGATION;  Surgeon: Garner Nash, DO;  Location: [facility];  Service: Thoracic;  Laterality: N/A;   VIDEO BRONCHOSCOPY WITH ENDOBRONCHIAL ULTRASOUND N/A 01/11/2019   Procedure: VIDEO BRONCHOSCOPY WITH ENDOBRONCHIAL ULTRASOUND;  Surgeon: Garner Nash, DO;  Location: [facility];  Service: Thoracic;  Laterality: N/A;   VIDEO BRONCHOSCOPY WITH RADIAL ENDOBRONCHIAL ULTRASOUND N/A 01/11/2019   Procedure: VIDEO BRONCHOSCOPY WITH RADIAL ENDOBRONCHIAL ULTRASOUND;  Surgeon: Garner Nash, DO;  Location: [facility];  Service: Thoracic;  Laterality: N/A;   WISDOM TOOTH EXTRACTION      Family History  Problem Relation Age of Onset   Heart attack Maternal Grandfather     Social History Social History   Tobacco Use   Smoking status: Current Every Day Smoker    Packs/day: 0.25    Years: 40.00    Pack years: 10.00    Types: Cigarettes   Smokeless tobacco: Never Used  Substance Use Topics   Alcohol use: Not Currently   Drug use: Not Currently    Types: Marijuana    No Known Allergies  Current Outpatient Medications  Medication Sig Dispense Refill   albuterol (PROVENTIL HFA;VENTOLIN HFA) 108 (90 Base) MCG/ACT inhaler Inhale 2 puffs into the lungs every 6 (six) hours as needed for wheezing or shortness of breath. 1 Inhaler 3   feeding supplement, ENSURE ENLIVE, (ENSURE ENLIVE) LIQD Take 237 mLs by mouth 3 (three) times daily between meals. (Patient taking differently: Take 237 mLs by mouth daily. ) 90 Bottle 0   tiotropium (SPIRIVA HANDIHALER) 18 MCG inhalation capsule Place 1 capsule (18 mcg total) into inhaler and inhale daily. 90 capsule 3   No current facility-administered medications for this visit.      Review of Systems  Constitutional: positive for fatigue and weight loss Eyes: negative Ears, nose, mouth, throat, and face: negative Respiratory: positive for cough and pleurisy/chest pain Cardiovascular: negative Gastrointestinal: negative Genitourinary:negative Integument/breast: negative Hematologic/lymphatic: negative Musculoskeletal:negative Neurological: negative Behavioral/Psych: negative Endocrine: negative Allergic/Immunologic: negative  Physical Exam  ATF:TDDUK, healthy, no distress, well nourished, well developed and anxious SKIN: skin color, texture, turgor are normal, no rashes or significant lesions HEAD: Normocephalic, No masses, lesions, tenderness or abnormalities EYES: normal, PERRLA, Conjunctiva are pink and non-injected EARS: External ears normal, Canals clear OROPHARYNX:no exudate, no erythema and lips, buccal mucosa, and tongue normal  NECK: supple, no adenopathy, no JVD LYMPH:  no palpable lymphadenopathy, no hepatosplenomegaly LUNGS: clear to auscultation , and palpation HEART: regular rate & rhythm, no murmurs and no gallops ABDOMEN:abdomen soft, non-tender, normal bowel sounds and no masses or organomegaly BACK: No CVA tenderness, Range of motion is normal EXTREMITIES:no joint deformities, effusion, or inflammation, no edema  NEURO: alert & oriented x 3 with fluent speech, no focal motor/sensory deficits  PERFORMANCE STATUS: ECOG 1  LABORATORY DATA: Lab Results  Component Value Date   WBC 7.8 01/17/2019  HGB 12.4 (L) 01/17/2019   HCT 36.9 (L) 01/17/2019   MCV 73.1 (L) 01/17/2019   PLT 127 (L) 01/17/2019      Chemistry      Component Value Date/Time   NA 136 01/11/2019 1249   K 4.1 01/11/2019 1249   CL 97 (L) 01/11/2019 1249   CO2 27 01/11/2019 1249   BUN <5 (L) 01/11/2019 1249   CREATININE 0.76 01/11/2019 1249      Component Value Date/Time   CALCIUM 8.9 01/11/2019 1249   ALKPHOS 81 01/11/2019 1249   AST 158 (H) 01/11/2019  1249   ALT 75 (H) 01/11/2019 1249   BILITOT 1.0 01/11/2019 1249       RADIOGRAPHIC STUDIES: Nm Pet Image Initial (pi) Skull Base To Thigh  Result Date: 12/27/2018 CLINICAL DATA:  Initial treatment strategy for solitary pulmonary nodule. EXAM: NUCLEAR MEDICINE PET SKULL BASE TO THIGH TECHNIQUE: 7.6 mCi F-18 FDG was injected intravenously. Full-ring PET imaging was performed from the skull base to thigh after the radiotracer. CT data was obtained and used for attenuation correction and anatomic localization. Fasting blood glucose: 70 mg/dl COMPARISON:  Chest CT 12/11/2018.  PET-CT 11/29/2017. FINDINGS: Mediastinal blood pool activity: SUV max 2.0 Liver activity: SUV max NA NECK: No hypermetabolic lymph nodes in the neck. Incidental CT findings: none CHEST: Small hypermetabolic focus noted proximal esophagus without underlying mass lesion evident on CT. 8 mm short axis subcarinal lymph node (36/4) is hypermetabolic with SUV max = 4.3. The 1.9 cm infrahilar right lower lobe nodule seen on recent diagnostic chest CT is markedly hypermetabolic with SUV max = 68.0. No hypermetabolic lymphadenopathy identified in either hilum. No hypermetabolic axillary lymphadenopathy. 9 mm nodule in the right upper lobe shows low level FDG uptake, well below blood pool levels ( SUV max = 0.8). This nodule has been stable comparing back to 11/29/2017. Scattered bilateral tiny cavitary nodules are seen (for example right upper lobe on 22/8 and left upper lobe on image 20/8). These are stable since 12/11/2018 but new since 11/29/2017. No hypermetabolic activity in most these nodules as they are well below threshold for reliable resolution on PET imaging, but the posterior left upper lobe nodule (20/8) is 1 of the more dominant cavitary nodules in shows discernible FDG accumulation with SUV max = 0.9. Incidental CT findings: Biapical pleuroparenchymal scarring again noted. Several other scattered areas of spiculated/nodular  architectural distortion in the lower lobes are similar. ABDOMEN/PELVIS: No abnormal hypermetabolic activity within the liver, pancreas, adrenal glands, or spleen. No hypermetabolic lymph nodes in the abdomen or pelvis. Incidental CT findings: Low attenuation of liver parenchyma compatible with steatosis. Calcified tiny gallstones evident. Tiny nonobstructing stones are seen in the right kidney. There is abdominal aortic atherosclerosis without aneurysm. SKELETON: No focal hypermetabolic activity to suggest skeletal metastasis. Incidental CT findings: none IMPRESSION: 1. Dominant infrahilar 1.9 cm right lower lobe nodule is markedly hypermetabolic consistent with neoplasm. 2. 8 mm short axis subcarinal lymph node shows hypermetabolism, concerning for metastatic disease. 3. Tiny cavitary nodules are identified in both upper lungs, new since 11/29/2017. One of the larger examples of these nodules is in the posterior left upper lobe and measures 5 mm. While the upper lung predominance raises the question of inhalational etiology, this 5 mm left upper lobe nodule does demonstrate FDG accumulation with SUV max = 0.9. Given discernible activity in such a tiny almost completely cavitary lesion, metastatic disease cannot be excluded. 4. Small focus of hypermetabolism in the proximal esophagus without underlying  mass lesion. Finding is considered nonspecific. Upper endoscopy could be used to further evaluate as clinically warranted. 5. No evidence for unexpected hypermetabolic disease in the abdomen or pelvis. 6. Scattered tiny noncavitary bilateral pulmonary nodules are well below threshold size for reliable resolution on PET imaging and show no substantial change since 11/29/2017. 7. Hepatic steatosis. 8.  Aortic Atherosclerois (ICD10-170.0) 9.  Emphysema. ([id number]-X51.9) Electronically Signed   By: Misty Stanley M.D.   On: 12/27/2018 17:11   Dg Chest Port 1 View  Result Date: 01/11/2019 CLINICAL DATA:  Status post  bronchoscopy EXAM: PORTABLE CHEST 1 VIEW COMPARISON:  None. FINDINGS: The heart size and mediastinal contours are within normal limits. No pneumothorax. Both lungs are clear. The visualized skeletal structures are unremarkable. IMPRESSION: No pneumothorax. Electronically Signed   By: Ulyses Jarred M.D.   On: 01/11/2019 18:21   Ct Super D Chest Wo Contrast  Result Date: 01/08/2019 CLINICAL DATA:  Lung mass. EXAM: CT CHEST WITHOUT CONTRAST TECHNIQUE: Multidetector CT imaging of the chest was performed using thin slice collimation for electromagnetic bronchoscopy planning purposes, without intravenous contrast. COMPARISON:  12/27/2018 FINDINGS: Cardiovascular: The heart size appears within normal limits. There is no pericardial effusion identified. Aortic atherosclerosis. Left circumflex coronary artery and RCA calcifications. Mediastinum/Nodes: Normal appearance of the thyroid gland. The trachea appears patent and is midline. Normal appearance of the esophagus. No enlarged mediastinal or hilar lymph nodes. No enlarged mediastinal or hilar lymph nodes. Lungs/Pleura: Moderate changes of centrilobular and paraseptal emphysema. The central right lower lobe lung mass measures 2.6 by 2.0 cm, image 112/3. This is compared with 2.6 x 1.7 cm previously. Scattered cavitary and cavitary lung nodules are again noted. -Stable index right upper lobe lung nodule measures 0.9 cm, image 36/3. -Irregular spiculated nodule within the posterior left lower lobe measures 0.7 cm, image 90/3. Unchanged. -Cavitary nodule within the posterior right upper lobe measures 0.7 cm, image 56/3. Unchanged. -Cavitary lung nodule within the posterior left upper lobe measures 0.8 cm, image 46/3. Previously 0.4 cm. -Posteromedial right lower lobe solid nodule measures 5 mm, image 120/3. Unchanged. Upper Abdomen: No acute abnormality. Musculoskeletal: No chest wall mass or suspicious bone lesions identified. IMPRESSION: 1. No significant change in the  appearance of dominant central right lower lobe lung mass. 2. Scattered solid and cavitary lung nodules also appears similar to previous study. 3. Aortic Atherosclerosis (ICD10-I70.0) and Emphysema (ICD10-J43.9). Coronary artery calcifications. Electronically Signed   By: Kerby Moors M.D.   On: 01/08/2019 14:16   Dg C-arm Bronchoscopy  Result Date: 01/11/2019 C-ARM BRONCHOSCOPY: Fluoroscopy was utilized by the requesting physician.  No radiographic interpretation.    ASSESSMENT: This is a very pleasant 58 years old African-American male with at least a stage IIIa (T1b, and 2, M0) non-small cell lung cancer, adenocarcinoma diagnosed in July 2020.  This could be stage IV disease if the bilateral pulmonary nodules are consistent with malignancy but they are below the detection level for a PET scan.  He presented with right lower lobe pulmonary nodule in addition to subcarinal lymphadenopathy.   PLAN: I had a lengthy discussion with the patient today about his current disease stage, prognosis and treatment options. I personally and independently reviewed the scan images and discussed the result and showed the images to the patient today. I recommended for the patient to complete the staging work-up by undergoing the MRI of the brain tomorrow as planned. I discussed with the patient his treatment options and recommended for him a course of  concurrent chemoradiation with weekly carboplatin for AUC of 2 and paclitaxel 45 mg/M2.  This will be followed by consolidation immunotherapy with Imfinzi if the patient has no evidence for disease progression after the induction phase. I discussed with the patient the adverse effect of the chemotherapy including but not limited to alopecia, myelosuppression, nausea and vomiting, peripheral neuropathy, liver or renal dysfunction. I referred the patient to radiation oncology for evaluation and discussion of his radiotherapy option. I will arrange for the patient to  have a chemotherapy education class before the first dose of his treatment. For nausea I will send a prescription for Compazine 10 mg p.o. every 6 hours as needed for nausea. He will come back for follow-up visit in 2 weeks for evaluation and management of any adverse effect of his treatment. The patient was advised to call immediately if he has any concerning symptoms in the interval. The patient voices understanding of current disease status and treatment options and is in agreement with the current care plan.  All questions were answered. The patient knows to call the clinic with any problems, questions or concerns. We can certainly see the patient much sooner if necessary.  Thank you so much for allowing me to participate in the care of Tyler [name]. I will continue to follow up the patient with you and assist in his care.  I spent 55 minutes counseling the patient face to face. The total time spent in the appointment was 80 minutes.  Disclaimer: This note was dictated with voice recognition software. Similar sounding words can inadvertently be transcribed and may not be corrected upon review.   Eilleen Kempf January 17, 2019, 1:38 PM

## 2019-01-17 NOTE — Progress Notes (Signed)
START ON PATHWAY REGIMEN - Non-Small Cell Lung     Administer weekly:     Paclitaxel      Carboplatin   **Always confirm dose/schedule in your pharmacy ordering system**  Patient Characteristics: Stage III - Unresectable, PS = 0, 1 AJCC T Category: T1b Current Disease Status: No Distant Mets or Local Recurrence AJCC N Category: N2 AJCC M Category: M0 AJCC 8 Stage Grouping: IIIA ECOG Performance Status: 1 Intent of Therapy: Curative Intent, Discussed with Patient

## 2019-01-18 ENCOUNTER — Other Ambulatory Visit: Payer: Self-pay | Admitting: *Deleted

## 2019-01-18 ENCOUNTER — Telehealth: Payer: Self-pay | Admitting: Internal Medicine

## 2019-01-18 ENCOUNTER — Ambulatory Visit (HOSPITAL_COMMUNITY)
Admission: RE | Admit: 2019-01-18 | Discharge: 2019-01-18 | Disposition: A | Discharge status: Home / Self Care | Payer: Medicaid Other | Source: Ambulatory Visit | Attending: Pulmonary Disease | Admitting: Pulmonary Disease

## 2019-01-18 ENCOUNTER — Ambulatory Visit: Payer: Self-pay | Admitting: Internal Medicine

## 2019-01-18 DIAGNOSIS — C3490 Malignant neoplasm of unspecified part of unspecified bronchus or lung: Secondary | ICD-10-CM | POA: Diagnosis not present

## 2019-01-18 MED ORDER — GADOBUTROL 1 MMOL/ML IV SOLN
7.0000 mL | Freq: Once | INTRAVENOUS | Status: AC | PRN
  Administered 2019-01-18: 17:00:00 7 mL via INTRAVENOUS

## 2019-01-18 NOTE — Telephone Encounter (Signed)
Scheduled appt per 7/09 los - pt aware of appts date and time.

## 2019-01-18 NOTE — Progress Notes (Signed)
The proposed treatment discussion at cancer conference on 01/18/2019 is for discussion purpose only and is not a binding recommendation.  The patient was not physically examined nor present for their treatment options.  Therefore, final treatment plans cannot be decided.

## 2019-01-19 ENCOUNTER — Telehealth: Payer: Self-pay | Admitting: *Deleted

## 2019-01-19 ENCOUNTER — Telehealth: Payer: Self-pay | Admitting: Pulmonary Disease

## 2019-01-19 NOTE — Telephone Encounter (Signed)
Contacted patient to verify telephone visit for pre reg °

## 2019-01-19 NOTE — Telephone Encounter (Signed)
ATC pt, line went to voicemail. LMTCB X1.

## 2019-01-22 ENCOUNTER — Inpatient Hospital Stay: Payer: Medicaid Other

## 2019-01-22 ENCOUNTER — Telehealth: Payer: Self-pay | Admitting: *Deleted

## 2019-01-22 NOTE — Telephone Encounter (Signed)
Per order that was placed 7/2 by BI, pt was referred to Dr. Julien Nordmann.  Called and spoke with pt letting him know that Dr. Julien Nordmann was the provider that [facility] referred him to. Pt verbalized understanding and stated he had known about Dr. Julien Nordmann as he has already had a visit with him. Pt stated that Dr. Julien Nordmann had referred him to another doctor and I stated to pt that he needed to contact Dr. Worthy Flank office to speak with him or his nurse in regards to whom Dr. Julien Nordmann referred him to and pt verbalized understanding. Nothing further needed.

## 2019-01-23 ENCOUNTER — Telehealth: Payer: Self-pay | Admitting: Radiation Oncology

## 2019-01-23 NOTE — Telephone Encounter (Signed)
Contacted patient to verify telephone visit for pre reg °

## 2019-01-23 NOTE — Progress Notes (Signed)
Thoracic Location of Tumor / Histology: Adenocarcinoma of right lung, stage 3  Patient presented and diagnosed with pneumonia in January 2019.  MRI Brain 01/18/2019: No evidence of metastatic disease.  Moderate chronic small vessel ischemic changes of the cerebral hemispheric deep white matter.  Bronchoscopy 01/11/2019: The final pathology ([id number]) showed malignant cells consistent with non-small cell carcinoma.  Immunohistochemistry is positive for TTF-1. Cytokeratin 5/6, S100, and MelanA are negative. The immunoprofile is consistent with a lung adenocarcinoma.  PET 12/27/2018: dominant infrahilar 1.9 cm right lower lobe nodule markedly hypermetabolic consistent with neoplasm.  There was also a 0.8 cm short axis subcarinal lymph node with hypermetabolism concerning for metastatic disease.  There was tiny cavitary nodules identified in both upper lungs new since Nov 29, 2017.   CT Chest 12/11/2018: spiculated pulmonary nodule of the infrahilar right lung has significantly increased in size and measured 1.9 x 1.8 cm highly concerning for malignancy.  There was additional small pulmonary nodules including 0.6 cm nodule in the right lower lobe, 0.9 cm nodule of the right pulmonary apex that was unchanged.  Bronchoscopy 04/05/2018: Not conclusive for malignancy.  CT Chest 03/27/2018: similar appearance of multiple bilateral pulmonary nodules including the low grade metabolically active lesion in the infrahilar right lower lobe seen on the previous PET scan.  PET 11/29/2017: low grade metabolic activity in the distinctly marginated broncho-vascular nodule medially in the right lower lobe with maximum SUV of 2.1.  CT Chest 08/10/2017: apical right upper lobe 0.8 cm cavitary pulmonary nodule and a small bronchogenic carcinoma was not excluded.  Biopsies of Right Lower Lobe Lung 01/11/2019     Tobacco/Marijuana/Snuff/ETOH use: Former smoker, quit 2018.  He states he smokes cigarettes occasionally.   Past/Anticipated interventions by cardiothoracic surgery, if any:   Past/Anticipated interventions by medical oncology, if any:  Dr. Julien Nordmann 01/17/2019 -I discussed with the patient his treatment options and recommended for him a course of concurrent chemoradiation with weekly carboplatin for AUC of 2 and paclitaxel 45 mg/M2.  This will be followed by consolidation immunotherapy with Imfinzi if the patient has no evidence for disease progression after the induction phase. -I referred the patient to radiation oncology for evaluation and discussion of his radiotherapy option. -I will arrange for the patient to have a chemotherapy education class before the first dose of his treatment. -He will come back for follow-up visit in 2 weeks for evaluation and management of any adverse effect of his treatment.  Signs/Symptoms  Weight changes, if any: 30 pounds down over the last year.  Respiratory complaints, if any: SOB with increased activities.  Hemoptysis, if any: Productive cough, white sputum.  Pain issues, if any:  No  BP (!) 143/92 (BP Location: Left Arm, Patient Position: Sitting)   Pulse 73   Temp 98 F (36.7 C) (Oral)   Resp 20   Ht 6\' 2"  (1.88 m)   Wt 134 lb 9.6 oz (61.1 kg)   SpO2 100%   BMI 17.28 kg/m    Wt Readings from Last 3 Encounters:  01/24/19 134 lb 9.6 oz (61.1 kg)  01/17/19 136 lb 9.6 oz (62 kg)  01/11/19 140 lb (63.5 kg)    SAFETY ISSUES:  Prior radiation? No  Pacemaker/ICD? No  Possible current pregnancy? N/A  Is the patient on methotrexate? No  Current Complaints / other details:

## 2019-01-24 ENCOUNTER — Other Ambulatory Visit: Payer: Self-pay

## 2019-01-24 ENCOUNTER — Ambulatory Visit
Admission: RE | Admit: 2019-01-24 | Discharge: 2019-01-24 | Disposition: A | Discharge status: Home / Self Care | Payer: Medicaid Other | Source: Ambulatory Visit | Attending: Radiation Oncology | Admitting: Radiation Oncology

## 2019-01-24 ENCOUNTER — Telehealth: Payer: Self-pay | Admitting: Internal Medicine

## 2019-01-24 ENCOUNTER — Encounter: Payer: Self-pay | Admitting: Radiation Oncology

## 2019-01-24 VITALS — BP 143/92 | HR 73 | Temp 98.0°F | Resp 20 | Ht 74.0 in | Wt 134.6 lb

## 2019-01-24 DIAGNOSIS — R634 Abnormal weight loss: Secondary | ICD-10-CM | POA: Insufficient documentation

## 2019-01-24 DIAGNOSIS — C3491 Malignant neoplasm of unspecified part of right bronchus or lung: Secondary | ICD-10-CM

## 2019-01-24 DIAGNOSIS — C3431 Malignant neoplasm of lower lobe, right bronchus or lung: Secondary | ICD-10-CM | POA: Insufficient documentation

## 2019-01-24 DIAGNOSIS — K760 Fatty (change of) liver, not elsewhere classified: Secondary | ICD-10-CM | POA: Diagnosis not present

## 2019-01-24 DIAGNOSIS — G8929 Other chronic pain: Secondary | ICD-10-CM | POA: Insufficient documentation

## 2019-01-24 DIAGNOSIS — J449 Chronic obstructive pulmonary disease, unspecified: Secondary | ICD-10-CM | POA: Diagnosis not present

## 2019-01-24 DIAGNOSIS — F17210 Nicotine dependence, cigarettes, uncomplicated: Secondary | ICD-10-CM | POA: Insufficient documentation

## 2019-01-24 DIAGNOSIS — Z79899 Other long term (current) drug therapy: Secondary | ICD-10-CM | POA: Diagnosis not present

## 2019-01-24 DIAGNOSIS — M549 Dorsalgia, unspecified: Secondary | ICD-10-CM | POA: Diagnosis not present

## 2019-01-24 DIAGNOSIS — I6782 Cerebral ischemia: Secondary | ICD-10-CM | POA: Diagnosis not present

## 2019-01-24 DIAGNOSIS — R918 Other nonspecific abnormal finding of lung field: Secondary | ICD-10-CM | POA: Diagnosis not present

## 2019-01-24 DIAGNOSIS — F1010 Alcohol abuse, uncomplicated: Secondary | ICD-10-CM | POA: Insufficient documentation

## 2019-01-24 DIAGNOSIS — I700 Atherosclerosis of aorta: Secondary | ICD-10-CM | POA: Diagnosis not present

## 2019-01-24 NOTE — Telephone Encounter (Signed)
Pt advised he does not need transportation assistance for tomorrow

## 2019-01-24 NOTE — Progress Notes (Signed)
Radiation Oncology         ([phone]) [phone] ________________________________  Name: Tyler [name]        MRN: [id number]  Date of Service: 01/24/2019 DOB: June 28, 1961  [id number], Tyler Crome, MD  Tyler Bears, MD     REFERRING PHYSICIAN: [name], MD   DIAGNOSIS: The encounter diagnosis was Adenocarcinoma of right lung, stage 3 ([name]).   HISTORY OF PRESENT ILLNESS: Tyler [name] is a 58 y.o. male seen at the request of Dr. Julien Nordmann for a newly diagnosed lung cancer. The patient was noted to have acute hypoxic respiratory failure in January 2019.  An inpatient CT of the chest with high resolution was performed revealing an 8 mm cavitary pulmonary nodule he also had interstitial pneumonia at that time.  He was followed in May 2019 given the nodule and a persistent 9 mm pulmonary nodule seen in the right upper lobe.  There was also a medial right lower lobe nodule measuring 13 mm increased from 10 mm previously and a right lower lobe 5 mm nodule without significant change.  He had a PET scan in May 2019 with low-grade metabolic activity of SUV of 2.1 in the right lower lip and the other nodules were below PET detection he did undergo super D scan and bronchoscopy in September 2019 and all the specimens were negative for malignancy.  A repeat scan in December 2019 revealed the right lower lobe nodule measuring 1 x 1.2 cm and right upper lobe nodule measuring 6 x 8 mm.  No adenopathy was identified.  A repeat scan on 12/11/2018 revealed growth in the right infrahilar lesion now measuring 1.9 x 1.8 cm, and persistence of a right lower lobe nodule measuring 6 mm and 9 mm right pulmonary apex nodule those were felt to be unchanged and no adenopathy.  PET on 12/27/2018 revealed hypermetabolic activity in the right lower lobe nodule with an SUV of 12.3, a short axis subcarinal node measuring 8 mm with an SUV of 4.3.  No hypermetabolic adenopathy was noted in the hilum, and right upper lobe nodule measuring  9 mm had a FDG uptake of less than blood pool.  There were other nodules that were below PET affinity.  He was counseled on the rationale for repeat bronchoscopy which appears performed on 01/11/2019, and final pathology revealed non-small cell carcinoma of the lung in the fine-needle aspirate of the right lower lobe nodule, bronchial brushing specimen, and level 7 node.  He met with Dr. Julien Nordmann, and also underwent brain MRI scan on 01/18/2019.  There was no evidence of metastatic disease.  He is diagnosed as stage III and has been counseled on the rationale for chemoradiation.  He is scheduled to begin this regimen on Monday, 01/29/2019.  He is seen today via WebEx to discuss options of radiotherapy.    PREVIOUS RADIATION THERAPY: No   PAST MEDICAL HISTORY:  Past Medical History:  Diagnosis Date   Alcohol abuse    Chronic back pain    COPD (chronic obstructive pulmonary disease) (HCC)    Dyspnea    Lung mass    mediastinal adenopathy   Lung nodule    Multiple pulmonary nodules 04/02/2018   Pneumonia    Right lower lobe pulmonary nodule 04/02/2018   12/01/2017: PET SUV 2.1   Tobacco abuse        PAST SURGICAL HISTORY: Past Surgical History:  Procedure Laterality Date   APPENDECTOMY     VIDEO BRONCHOSCOPY WITH ENDOBRONCHIAL NAVIGATION N/A 04/05/2018  Procedure: VIDEO BRONCHOSCOPY WITH ENDOBRONCHIAL NAVIGATION;  Surgeon: Garner Nash, DO;  Location: [facility];  Service: Thoracic;  Laterality: N/A;   VIDEO BRONCHOSCOPY WITH ENDOBRONCHIAL NAVIGATION N/A 01/11/2019   Procedure: VIDEO BRONCHOSCOPY WITH ENDOBRONCHIAL NAVIGATION;  Surgeon: Garner Nash, DO;  Location: [facility] OR;  Service: Thoracic;  Laterality: N/A;   VIDEO BRONCHOSCOPY WITH ENDOBRONCHIAL ULTRASOUND N/A 01/11/2019   Procedure: VIDEO BRONCHOSCOPY WITH ENDOBRONCHIAL ULTRASOUND;  Surgeon: Garner Nash, DO;  Location: [facility];  Service: Thoracic;  Laterality: N/A;   VIDEO BRONCHOSCOPY WITH RADIAL ENDOBRONCHIAL ULTRASOUND  N/A 01/11/2019   Procedure: VIDEO BRONCHOSCOPY WITH RADIAL ENDOBRONCHIAL ULTRASOUND;  Surgeon: Garner Nash, DO;  Location: [place];  Service: Thoracic;  Laterality: N/A;   WISDOM TOOTH EXTRACTION       FAMILY HISTORY:  Family History  Problem Relation Age of Onset   Heart attack Maternal Grandfather      SOCIAL HISTORY:  reports that he has been smoking cigarettes. He has a 10.00 pack-year smoking history. He has never used smokeless tobacco. He reports previous alcohol use. He reports previous drug use. Drug: Marijuana.   ALLERGIES: Patient has no known allergies.   MEDICATIONS:  Current Outpatient Medications  Medication Sig Dispense Refill   albuterol (PROVENTIL HFA;VENTOLIN HFA) 108 (90 Base) MCG/ACT inhaler Inhale 2 puffs into the lungs every 6 (six) hours as needed for wheezing or shortness of breath. 1 Inhaler 3   feeding supplement, ENSURE ENLIVE, (ENSURE ENLIVE) LIQD Take 237 mLs by mouth 3 (three) times daily between meals. (Patient taking differently: Take 237 mLs by mouth daily. ) 90 Bottle 0   naproxen sodium (ALEVE) 220 MG tablet Take 220 mg by mouth daily as needed.     tiotropium (SPIRIVA HANDIHALER) 18 MCG inhalation capsule Place 1 capsule (18 mcg total) into inhaler and inhale daily. 90 capsule 3   No current facility-administered medications for this encounter.      REVIEW OF SYSTEMS: On review of systems, the patient reports that he is doing well overall. He denies any chest pain. He notes shortness of breath with exertion, a productive cough with clear to white sputum but no blood. He has lost about 30 pounds unintentionally over the last year. He denies fevers, chills, or night sweats. He denies any bowel or bladder disturbances, and denies abdominal pain, nausea or vomiting. He denies any new musculoskeletal or joint aches or pains. A complete review of systems is obtained and is otherwise negative.     PHYSICAL EXAM:  Wt Readings from Last 3  Encounters:  01/24/19 134 lb 9.6 oz (61.1 kg)  01/17/19 136 lb 9.6 oz (62 kg)  01/11/19 140 lb (63.5 kg)   Temp Readings from Last 3 Encounters:  01/24/19 98 F (36.7 C) (Oral)  01/17/19 98.2 F (36.8 C) (Oral)  01/11/19 98.1 F (36.7 C)   BP Readings from Last 3 Encounters:  01/24/19 (!) 143/92  01/17/19 (!) 126/93  01/11/19 136/87   Pulse Readings from Last 3 Encounters:  01/24/19 73  01/17/19 81  01/11/19 81   Pain Assessment Pain Score: 0-No pain  In general this is a thin, tired appearing African American male in no acute distress. He's alert and oriented x4 and appropriate throughout the examination. Cardiopulmonary assessment is negative for acute distress and he exhibits normal effort.     ECOG = 1  0 - Asymptomatic (Fully active, able to carry on all predisease activities without restriction)  1 - Symptomatic but completely ambulatory (Restricted  in physically strenuous activity but ambulatory and able to carry out work of a light or sedentary nature. For example, light housework, office work)  2 - Symptomatic, <50% in bed during the day (Ambulatory and capable of all self care but unable to carry out any work activities. Up and about more than 50% of waking hours)  3 - Symptomatic, >50% in bed, but not bedbound (Capable of only limited self-care, confined to bed or chair 50% or more of waking hours)  4 - Bedbound (Completely disabled. Cannot carry on any self-care. Totally confined to bed or chair)  5 - Death   Eustace Pen MM, Creech RH, Tormey DC, et al. [phone]). "Toxicity and response criteria of the [facility]". [place] Oncol. 5 (6): 649-55    LABORATORY DATA:  Lab Results  Component Value Date   WBC 7.8 01/17/2019   HGB 12.4 (L) 01/17/2019   HCT 36.9 (L) 01/17/2019   MCV 73.1 (L) 01/17/2019   PLT 127 (L) 01/17/2019   Lab Results  Component Value Date   NA 138 01/17/2019   K 4.0 01/17/2019   CL 98 01/17/2019   CO2 29  01/17/2019   Lab Results  Component Value Date   ALT 72 (H) 01/17/2019   AST 101 (H) 01/17/2019   ALKPHOS 85 01/17/2019   BILITOT 1.1 01/17/2019      RADIOGRAPHY: Mr Jeri Cos [place] Contrast  Result Date: 01/19/2019 CLINICAL DATA:  Staging of non-small cell lung cancer. EXAM: MRI HEAD WITHOUT AND WITH CONTRAST TECHNIQUE: Multiplanar, multiecho pulse sequences of the brain and surrounding structures were obtained without and with intravenous contrast. CONTRAST:  7 cc Gadavist COMPARISON:  None. FINDINGS: Brain: Diffusion imaging does not show any acute or subacute infarction. The brainstem and cerebellum are normal. Cerebral hemispheres show moderate chronic small-vessel ischemic changes of the deep white matter. No cortical or large vessel territory infarction. No mass lesion, hemorrhage, hydrocephalus or extra-axial collection. No abnormal contrast enhancement occurs. Vascular: Major vessels at the base of the brain show flow. Skull and upper cervical spine: Negative Sinuses/Orbits: Clear/normal Other: None IMPRESSION: No evidence of metastatic disease. Moderate chronic small-vessel ischemic changes of the cerebral hemispheric deep white matter. Electronically Signed   By: Nelson Chimes M.D.   On: 01/19/2019 09:06   Nm Pet Image Initial (pi) Skull Base To Thigh  Result Date: 12/27/2018 CLINICAL DATA:  Initial treatment strategy for solitary pulmonary nodule. EXAM: NUCLEAR MEDICINE PET SKULL BASE TO THIGH TECHNIQUE: 7.6 mCi F-18 FDG was injected intravenously. Full-ring PET imaging was performed from the skull base to thigh after the radiotracer. CT data was obtained and used for attenuation correction and anatomic localization. Fasting blood glucose: 70 mg/dl COMPARISON:  Chest CT 12/11/2018.  PET-CT 11/29/2017. FINDINGS: Mediastinal blood pool activity: SUV max 2.0 Liver activity: SUV max NA NECK: No hypermetabolic lymph nodes in the neck. Incidental CT findings: none CHEST: Small hypermetabolic focus  noted proximal esophagus without underlying mass lesion evident on CT. 8 mm short axis subcarinal lymph node (60/7) is hypermetabolic with SUV max = 4.3. The 1.9 cm infrahilar right lower lobe nodule seen on recent diagnostic chest CT is markedly hypermetabolic with SUV max = 37.1. No hypermetabolic lymphadenopathy identified in either hilum. No hypermetabolic axillary lymphadenopathy. 9 mm nodule in the right upper lobe shows low level FDG uptake, well below blood pool levels ( SUV max = 0.8). This nodule has been stable comparing back to 11/29/2017. Scattered bilateral tiny cavitary nodules are  seen (for example right upper lobe on 22/8 and left upper lobe on image 20/8). These are stable since 12/11/2018 but new since 11/29/2017. No hypermetabolic activity in most these nodules as they are well below threshold for reliable resolution on PET imaging, but the posterior left upper lobe nodule (20/8) is 1 of the more dominant cavitary nodules in shows discernible FDG accumulation with SUV max = 0.9. Incidental CT findings: Biapical pleuroparenchymal scarring again noted. Several other scattered areas of spiculated/nodular architectural distortion in the lower lobes are similar. ABDOMEN/PELVIS: No abnormal hypermetabolic activity within the liver, pancreas, adrenal glands, or spleen. No hypermetabolic lymph nodes in the abdomen or pelvis. Incidental CT findings: Low attenuation of liver parenchyma compatible with steatosis. Calcified tiny gallstones evident. Tiny nonobstructing stones are seen in the right kidney. There is abdominal aortic atherosclerosis without aneurysm. SKELETON: No focal hypermetabolic activity to suggest skeletal metastasis. Incidental CT findings: none IMPRESSION: 1. Dominant infrahilar 1.9 cm right lower lobe nodule is markedly hypermetabolic consistent with neoplasm. 2. 8 mm short axis subcarinal lymph node shows hypermetabolism, concerning for metastatic disease. 3. Tiny cavitary nodules are  identified in both upper lungs, new since 11/29/2017. One of the larger examples of these nodules is in the posterior left upper lobe and measures 5 mm. While the upper lung predominance raises the question of inhalational etiology, this 5 mm left upper lobe nodule does demonstrate FDG accumulation with SUV max = 0.9. Given discernible activity in such a tiny almost completely cavitary lesion, metastatic disease cannot be excluded. 4. Small focus of hypermetabolism in the proximal esophagus without underlying mass lesion. Finding is considered nonspecific. Upper endoscopy could be used to further evaluate as clinically warranted. 5. No evidence for unexpected hypermetabolic disease in the abdomen or pelvis. 6. Scattered tiny noncavitary bilateral pulmonary nodules are well below threshold size for reliable resolution on PET imaging and show no substantial change since 11/29/2017. 7. Hepatic steatosis. 8.  Aortic Atherosclerois (ICD10-170.0) 9.  Emphysema. (HAL93-X90.9) Electronically Signed   By: Misty Stanley M.D.   On: 12/27/2018 17:11   Dg Chest Port 1 View  Result Date: 01/11/2019 CLINICAL DATA:  Status post bronchoscopy EXAM: PORTABLE CHEST 1 VIEW COMPARISON:  None. FINDINGS: The heart size and mediastinal contours are within normal limits. No pneumothorax. Both lungs are clear. The visualized skeletal structures are unremarkable. IMPRESSION: No pneumothorax. Electronically Signed   By: Ulyses Jarred M.D.   On: 01/11/2019 18:21   Ct Super D Chest Wo Contrast  Result Date: 01/08/2019 CLINICAL DATA:  Lung mass. EXAM: CT CHEST WITHOUT CONTRAST TECHNIQUE: Multidetector CT imaging of the chest was performed using thin slice collimation for electromagnetic bronchoscopy planning purposes, without intravenous contrast. COMPARISON:  12/27/2018 FINDINGS: Cardiovascular: The heart size appears within normal limits. There is no pericardial effusion identified. Aortic atherosclerosis. Left circumflex coronary artery  and RCA calcifications. Mediastinum/Nodes: Normal appearance of the thyroid gland. The trachea appears patent and is midline. Normal appearance of the esophagus. No enlarged mediastinal or hilar lymph nodes. No enlarged mediastinal or hilar lymph nodes. Lungs/Pleura: Moderate changes of centrilobular and paraseptal emphysema. The central right lower lobe lung mass measures 2.6 by 2.0 cm, image 112/3. This is compared with 2.6 x 1.7 cm previously. Scattered cavitary and cavitary lung nodules are again noted. -Stable index right upper lobe lung nodule measures 0.9 cm, image 36/3. -Irregular spiculated nodule within the posterior left lower lobe measures 0.7 cm, image 90/3. Unchanged. -Cavitary nodule within the posterior right upper lobe measures 0.7  cm, image 56/3. Unchanged. -Cavitary lung nodule within the posterior left upper lobe measures 0.8 cm, image 46/3. Previously 0.4 cm. -Posteromedial right lower lobe solid nodule measures 5 mm, image 120/3. Unchanged. Upper Abdomen: No acute abnormality. Musculoskeletal: No chest wall mass or suspicious bone lesions identified. IMPRESSION: 1. No significant change in the appearance of dominant central right lower lobe lung mass. 2. Scattered solid and cavitary lung nodules also appears similar to previous study. 3. Aortic Atherosclerosis (ICD10-I70.0) and Emphysema (ICD10-J43.9). Coronary artery calcifications. Electronically Signed   By: Kerby Moors M.D.   On: 01/08/2019 14:16   Dg C-arm Bronchoscopy  Result Date: 01/11/2019 C-ARM BRONCHOSCOPY: Fluoroscopy was utilized by the requesting physician.  No radiographic interpretation.       IMPRESSION/PLAN: 1. Stage IIIA, [id number] NSCLC, adenocarcinoma of the RLL. Dr. Lisbeth Renshaw discusses the pathology findings and reviews the nature of locally advanced lung cancer. He  Discussed the rationale to proceed with chemoRT. We discussed the risks, benefits, short, and long term effects of radiotherapy, and the patient is  interested in proceeding. Dr. Lisbeth Renshaw discusses the delivery and logistics of radiotherapy and anticipates a course of 6 1/2 weeks of radiotherapy. Written consent is obtained and placed in the chart, a copy was provided to the patient. He will return tomorrow at 8 am for simulation with the intention of starting on 01/29/2019. 2. Transportation limitations. The patient does not drive and requires assistance from family to appointments. We will ask the transportation department at the cancer center to assist in coordinating rides for his appointments.   This encounter was provided by telemedicine platform Webex.  The patient has given verbal consent for this type of encounter and has been advised to only accept a meeting of this type in a secure network environment. The time spent during this encounter was 60 minutes. The attendants for this meeting include Blenda Nicely, RN, Dr. Lisbeth Renshaw, Hayden Pedro  and [name].  During the encounter,  Blenda Nicely, RN, Dr. Lisbeth Renshaw, and Hayden Pedro were located at [facility] Radiation Oncology Department.  KURTISS WENCE was located also in the cancer center radiation oncology clinic room via webex.  The above documentation reflects my direct findings during this shared patient visit. Please see the separate note by Dr. Lisbeth Renshaw on this date for the remainder of the patient's plan of care.    Carola Rhine, PAC

## 2019-01-25 ENCOUNTER — Other Ambulatory Visit: Payer: Self-pay

## 2019-01-25 ENCOUNTER — Ambulatory Visit
Admission: RE | Admit: 2019-01-25 | Discharge: 2019-01-25 | Disposition: A | Discharge status: Home / Self Care | Payer: Medicaid Other | Source: Ambulatory Visit | Attending: Radiation Oncology | Admitting: Radiation Oncology

## 2019-01-25 DIAGNOSIS — C3431 Malignant neoplasm of lower lobe, right bronchus or lung: Secondary | ICD-10-CM | POA: Insufficient documentation

## 2019-01-25 DIAGNOSIS — F17210 Nicotine dependence, cigarettes, uncomplicated: Secondary | ICD-10-CM | POA: Insufficient documentation

## 2019-01-25 DIAGNOSIS — C3491 Malignant neoplasm of unspecified part of right bronchus or lung: Secondary | ICD-10-CM

## 2019-01-25 DIAGNOSIS — Z510 Encounter for antineoplastic radiation therapy: Secondary | ICD-10-CM | POA: Insufficient documentation

## 2019-01-26 DIAGNOSIS — Z510 Encounter for antineoplastic radiation therapy: Secondary | ICD-10-CM | POA: Diagnosis not present

## 2019-01-26 DIAGNOSIS — C3431 Malignant neoplasm of lower lobe, right bronchus or lung: Secondary | ICD-10-CM | POA: Diagnosis not present

## 2019-01-29 ENCOUNTER — Other Ambulatory Visit: Payer: Self-pay | Admitting: Medical Oncology

## 2019-01-29 ENCOUNTER — Inpatient Hospital Stay: Payer: Medicaid Other

## 2019-01-29 ENCOUNTER — Other Ambulatory Visit: Payer: Self-pay

## 2019-01-29 ENCOUNTER — Telehealth: Payer: Self-pay | Admitting: Internal Medicine

## 2019-01-29 ENCOUNTER — Ambulatory Visit
Admission: RE | Admit: 2019-01-29 | Discharge: 2019-01-29 | Disposition: A | Discharge status: Home / Self Care | Payer: Medicaid Other | Source: Ambulatory Visit | Attending: Radiation Oncology | Admitting: Radiation Oncology

## 2019-01-29 VITALS — BP 142/85 | HR 54 | Temp 99.2°F | Resp 18

## 2019-01-29 DIAGNOSIS — C3431 Malignant neoplasm of lower lobe, right bronchus or lung: Secondary | ICD-10-CM | POA: Diagnosis not present

## 2019-01-29 DIAGNOSIS — Z510 Encounter for antineoplastic radiation therapy: Secondary | ICD-10-CM | POA: Diagnosis not present

## 2019-01-29 DIAGNOSIS — C3491 Malignant neoplasm of unspecified part of right bronchus or lung: Secondary | ICD-10-CM

## 2019-01-29 DIAGNOSIS — Z5111 Encounter for antineoplastic chemotherapy: Secondary | ICD-10-CM | POA: Diagnosis not present

## 2019-01-29 LAB — CBC WITH DIFFERENTIAL (CANCER CENTER ONLY)
Abs Immature Granulocytes: 0.02 10*3/uL (ref 0.00–0.07)
Basophils Absolute: 0.1 10*3/uL (ref 0.0–0.1)
Basophils Relative: 1 %
Eosinophils Absolute: 0.1 10*3/uL (ref 0.0–0.5)
Eosinophils Relative: 1 %
HCT: 36.1 % — ABNORMAL LOW (ref 39.0–52.0)
Hemoglobin: 12 g/dL — ABNORMAL LOW (ref 13.0–17.0)
Immature Granulocytes: 0 %
Lymphocytes Relative: 37 %
Lymphs Abs: 1.8 10*3/uL (ref 0.7–4.0)
MCH: 24.5 pg — ABNORMAL LOW (ref 26.0–34.0)
MCHC: 33.2 g/dL (ref 30.0–36.0)
MCV: 73.8 fL — ABNORMAL LOW (ref 80.0–100.0)
Monocytes Absolute: 0.6 10*3/uL (ref 0.1–1.0)
Monocytes Relative: 12 %
Neutro Abs: 2.4 10*3/uL (ref 1.7–7.7)
Neutrophils Relative %: 49 %
Platelet Count: 151 10*3/uL (ref 150–400)
RBC: 4.89 MIL/uL (ref 4.22–5.81)
RDW: 15.4 % (ref 11.5–15.5)
WBC Count: 5 10*3/uL (ref 4.0–10.5)
nRBC: 0.4 % — ABNORMAL HIGH (ref 0.0–0.2)

## 2019-01-29 LAB — CMP (CANCER CENTER ONLY)
ALT: 45 U/L — ABNORMAL HIGH (ref 0–44)
AST: 67 U/L — ABNORMAL HIGH (ref 15–41)
Albumin: 3.9 g/dL (ref 3.5–5.0)
Alkaline Phosphatase: 106 U/L (ref 38–126)
Anion gap: 12 (ref 5–15)
BUN: 8 mg/dL (ref 6–20)
CO2: 25 mmol/L (ref 22–32)
Calcium: 8.6 mg/dL — ABNORMAL LOW (ref 8.9–10.3)
Chloride: 102 mmol/L (ref 98–111)
Creatinine: 0.79 mg/dL (ref 0.61–1.24)
GFR, Est AFR Am: >60 mL/min (ref >60)
GFR, Estimated: >60 mL/min (ref >60)
Glucose, Bld: 88 mg/dL (ref 70–99)
Potassium: 3.9 mmol/L (ref 3.5–5.1)
Sodium: 139 mmol/L (ref 135–145)
Total Bilirubin: 1 mg/dL (ref 0.3–1.2)
Total Protein: 7.7 g/dL (ref 6.5–8.1)

## 2019-01-29 MED ORDER — SODIUM CHLORIDE 0.9 % IV SOLN
222.2000 mg | Freq: Once | INTRAVENOUS | Status: AC
  Administered 2019-01-29: 12:00:00 220 mg via INTRAVENOUS
  Filled 2019-01-29: qty 22

## 2019-01-29 MED ORDER — SODIUM CHLORIDE 0.9 % IV SOLN
20.0000 mg | Freq: Once | INTRAVENOUS | Status: AC
  Administered 2019-01-29: 10:00:00 20 mg via INTRAVENOUS
  Filled 2019-01-29: qty 20

## 2019-01-29 MED ORDER — PALONOSETRON HCL INJECTION 0.25 MG/5ML
0.2500 mg | Freq: Once | INTRAVENOUS | Status: AC
  Administered 2019-01-29: 0.25 mg via INTRAVENOUS

## 2019-01-29 MED ORDER — PALONOSETRON HCL INJECTION 0.25 MG/5ML
INTRAVENOUS | Status: AC
  Filled 2019-01-29: qty 5

## 2019-01-29 MED ORDER — SODIUM CHLORIDE 0.9 % IV SOLN
Freq: Once | INTRAVENOUS | Status: AC
  Administered 2019-01-29: 10:00:00 via INTRAVENOUS
  Filled 2019-01-29: qty 250

## 2019-01-29 MED ORDER — SODIUM CHLORIDE 0.9 % IV SOLN
45.0000 mg/m2 | Freq: Once | INTRAVENOUS | Status: AC
  Administered 2019-01-29: 11:00:00 84 mg via INTRAVENOUS
  Filled 2019-01-29: qty 14

## 2019-01-29 MED ORDER — PROCHLORPERAZINE MALEATE 10 MG PO TABS: 10.0000 mg | ORAL_TABLET | Freq: Four times a day (QID) | ORAL | 0 refills | Status: DC | PRN

## 2019-01-29 MED ORDER — FAMOTIDINE IN NACL 20-0.9 MG/50ML-% IV SOLN
20.0000 mg | Freq: Once | INTRAVENOUS | Status: AC
  Administered 2019-01-29: 20 mg via INTRAVENOUS

## 2019-01-29 MED ORDER — DIPHENHYDRAMINE HCL 50 MG/ML IJ SOLN
50.0000 mg | Freq: Once | INTRAMUSCULAR | Status: AC
  Administered 2019-01-29: 50 mg via INTRAVENOUS

## 2019-01-29 MED ORDER — FAMOTIDINE IN NACL 20-0.9 MG/50ML-% IV SOLN
INTRAVENOUS | Status: AC
  Filled 2019-01-29: qty 50

## 2019-01-29 MED ORDER — DIPHENHYDRAMINE HCL 50 MG/ML IJ SOLN
INTRAMUSCULAR | Status: AC
  Filled 2019-01-29: qty 1

## 2019-01-29 NOTE — Patient Instructions (Signed)
[facility] Discharge Instructions for Patients Receiving Chemotherapy  Today you received the following chemotherapy agents: Taxol and Carboplatin.  To help prevent nausea and vomiting after your treatment, we encourage you to take your nausea medication as directed.   If you develop nausea and vomiting that is not controlled by your nausea medication, call the clinic.   BELOW ARE SYMPTOMS THAT SHOULD BE REPORTED IMMEDIATELY:  *FEVER GREATER THAN 100.5 F  *CHILLS WITH OR WITHOUT FEVER  NAUSEA AND VOMITING THAT IS NOT CONTROLLED WITH YOUR NAUSEA MEDICATION  *UNUSUAL SHORTNESS OF BREATH  *UNUSUAL BRUISING OR BLEEDING  TENDERNESS IN MOUTH AND THROAT WITH OR WITHOUT PRESENCE OF ULCERS  *URINARY PROBLEMS  *BOWEL PROBLEMS  UNUSUAL RASH Items with * indicate a potential emergency and should be followed up as soon as possible.  Feel free to call the clinic should you have any questions or concerns. The clinic phone number is [phone].  Please show the CHEMO ALERT CARD at check-in to the Emergency Department and triage nurse.  Paclitaxel injection What is this medicine? PACLITAXEL (PAK li TAX el) is a chemotherapy drug. It targets fast dividing cells, like cancer cells, and causes these cells to die. This medicine is used to treat ovarian cancer, breast cancer, lung cancer, Kaposi's sarcoma, and other cancers. This medicine may be used for other purposes; ask your health care provider or pharmacist if you have questions. COMMON BRAND NAME(S): Onxol, Taxol What should I tell my health care provider before I take this medicine? They need to know if you have any of these conditions:  history of irregular heartbeat  liver disease  low blood counts, like low white cell, platelet, or red cell counts  lung or breathing disease, like asthma  tingling of the fingers or toes, or other nerve disorder  an unusual or allergic reaction to paclitaxel, alcohol,  polyoxyethylated castor oil, other chemotherapy, other medicines, foods, dyes, or preservatives  pregnant or trying to get pregnant  breast-feeding How should I use this medicine? This drug is given as an infusion into a vein. It is administered in a hospital or clinic by a specially trained health care professional. Talk to your pediatrician regarding the use of this medicine in children. Special care may be needed. Overdosage: If you think you have taken too much of this medicine contact a poison control center or emergency room at once. NOTE: This medicine is only for you. Do not share this medicine with others. What if I miss a dose? It is important not to miss your dose. Call your doctor or health care professional if you are unable to keep an appointment. What may interact with this medicine? Do not take this medicine with any of the following medications:  disulfiram  metronidazole This medicine may also interact with the following medications:  antiviral medicines for hepatitis, HIV or AIDS  certain antibiotics like erythromycin and clarithromycin  certain medicines for fungal infections like ketoconazole and itraconazole  certain medicines for seizures like carbamazepine, phenobarbital, phenytoin  gemfibrozil  nefazodone  rifampin  St. John's wort This list may not describe all possible interactions. Give your health care provider a list of all the medicines, herbs, non-prescription drugs, or dietary supplements you use. Also tell them if you smoke, drink alcohol, or use illegal drugs. Some items may interact with your medicine. What should I watch for while using this medicine? Your condition will be monitored carefully while you are receiving this medicine. You will need important   blood work done while you are taking this medicine. This medicine can cause serious allergic reactions. To reduce your risk you will need to take other medicine(s) before treatment with this  medicine. If you experience allergic reactions like skin rash, itching or hives, swelling of the face, lips, or tongue, tell your doctor or health care professional right away. In some cases, you may be given additional medicines to help with side effects. Follow all directions for their use. This drug may make you feel generally unwell. This is not uncommon, as chemotherapy can affect healthy cells as well as cancer cells. Report any side effects. Continue your course of treatment even though you feel ill unless your doctor tells you to stop. Call your doctor or health care professional for advice if you get a fever, chills or sore throat, or other symptoms of a cold or flu. Do not treat yourself. This drug decreases your body's ability to fight infections. Try to avoid being around people who are sick. This medicine may increase your risk to bruise or bleed. Call your doctor or health care professional if you notice any unusual bleeding. Be careful brushing and flossing your teeth or using a toothpick because you may get an infection or bleed more easily. If you have any dental work done, tell your dentist you are receiving this medicine. Avoid taking products that contain aspirin, acetaminophen, ibuprofen, naproxen, or ketoprofen unless instructed by your doctor. These medicines may hide a fever. Do not become pregnant while taking this medicine. Women should inform their doctor if they wish to become pregnant or think they might be pregnant. There is a potential for serious side effects to an unborn child. Talk to your health care professional or pharmacist for more information. Do not breast-feed an infant while taking this medicine. Men are advised not to father a child while receiving this medicine. This product may contain alcohol. Ask your pharmacist or healthcare provider if this medicine contains alcohol. Be sure to tell all healthcare providers you are taking this medicine. Certain medicines,  like metronidazole and disulfiram, can cause an unpleasant reaction when taken with alcohol. The reaction includes flushing, headache, nausea, vomiting, sweating, and increased thirst. The reaction can last from 30 minutes to several hours. What side effects may I notice from receiving this medicine? Side effects that you should report to your doctor or health care professional as soon as possible:  allergic reactions like skin rash, itching or hives, swelling of the face, lips, or tongue  breathing problems  changes in vision  fast, irregular heartbeat  high or low blood pressure  mouth sores  pain, tingling, numbness in the hands or feet  signs of decreased platelets or bleeding - bruising, pinpoint red spots on the skin, black, tarry stools, blood in the urine  signs of decreased red blood cells - unusually weak or tired, feeling faint or lightheaded, falls  signs of infection - fever or chills, cough, sore throat, pain or difficulty passing urine  signs and symptoms of liver injury like dark yellow or brown urine; general ill feeling or flu-like symptoms; light-colored stools; loss of appetite; nausea; right upper belly pain; unusually weak or tired; yellowing of the eyes or skin  swelling of the ankles, feet, hands  unusually slow heartbeat Side effects that usually do not require medical attention (report to your doctor or health care professional if they continue or are bothersome):  diarrhea  hair loss  loss of appetite  muscle or joint   pain  nausea, vomiting  pain, redness, or irritation at site where injected  tiredness This list may not describe all possible side effects. Call your doctor for medical advice about side effects. You may report side effects to FDA at 1-800-FDA-1088. Where should I keep my medicine? This drug is given in a hospital or clinic and will not be stored at home. NOTE: This sheet is a summary. It may not cover all possible information.  If you have questions about this medicine, talk to your doctor, pharmacist, or health care provider.  2020 Elsevier/Gold Standard (2017-03-01 13:14:55) Carboplatin injection What is this medicine? CARBOPLATIN (KAR boe pla tin) is a chemotherapy drug. It targets fast dividing cells, like cancer cells, and causes these cells to die. This medicine is used to treat ovarian cancer and many other cancers. This medicine may be used for other purposes; ask your health care provider or pharmacist if you have questions. COMMON BRAND NAME(S): Paraplatin What should I tell my health care provider before I take this medicine? They need to know if you have any of these conditions:  blood disorders  hearing problems  kidney disease  recent or ongoing radiation therapy  an unusual or allergic reaction to carboplatin, cisplatin, other chemotherapy, other medicines, foods, dyes, or preservatives  pregnant or trying to get pregnant  breast-feeding How should I use this medicine? This drug is usually given as an infusion into a vein. It is administered in a hospital or clinic by a specially trained health care professional. Talk to your pediatrician regarding the use of this medicine in children. Special care may be needed. Overdosage: If you think you have taken too much of this medicine contact a poison control center or emergency room at once. NOTE: This medicine is only for you. Do not share this medicine with others. What if I miss a dose? It is important not to miss a dose. Call your doctor or health care professional if you are unable to keep an appointment. What may interact with this medicine?  medicines for seizures  medicines to increase blood counts like filgrastim, pegfilgrastim, sargramostim  some antibiotics like amikacin, gentamicin, neomycin, streptomycin, tobramycin  vaccines Talk to your doctor or health care professional before taking any of these  medicines:  acetaminophen  aspirin  ibuprofen  ketoprofen  naproxen This list may not describe all possible interactions. Give your health care provider a list of all the medicines, herbs, non-prescription drugs, or dietary supplements you use. Also tell them if you smoke, drink alcohol, or use illegal drugs. Some items may interact with your medicine. What should I watch for while using this medicine? Your condition will be monitored carefully while you are receiving this medicine. You will need important blood work done while you are taking this medicine. This drug may make you feel generally unwell. This is not uncommon, as chemotherapy can affect healthy cells as well as cancer cells. Report any side effects. Continue your course of treatment even though you feel ill unless your doctor tells you to stop. In some cases, you may be given additional medicines to help with side effects. Follow all directions for their use. Call your doctor or health care professional for advice if you get a fever, chills or sore throat, or other symptoms of a cold or flu. Do not treat yourself. This drug decreases your body's ability to fight infections. Try to avoid being around people who are sick. This medicine may increase your risk to bruise or   bleed. Call your doctor or health care professional if you notice any unusual bleeding. Be careful brushing and flossing your teeth or using a toothpick because you may get an infection or bleed more easily. If you have any dental work done, tell your dentist you are receiving this medicine. Avoid taking products that contain aspirin, acetaminophen, ibuprofen, naproxen, or ketoprofen unless instructed by your doctor. These medicines may hide a fever. Do not become pregnant while taking this medicine. Women should inform their doctor if they wish to become pregnant or think they might be pregnant. There is a potential for serious side effects to an unborn child. Talk  to your health care professional or pharmacist for more information. Do not breast-feed an infant while taking this medicine. What side effects may I notice from receiving this medicine? Side effects that you should report to your doctor or health care professional as soon as possible:  allergic reactions like skin rash, itching or hives, swelling of the face, lips, or tongue  signs of infection - fever or chills, cough, sore throat, pain or difficulty passing urine  signs of decreased platelets or bleeding - bruising, pinpoint red spots on the skin, black, tarry stools, nosebleeds  signs of decreased red blood cells - unusually weak or tired, fainting spells, lightheadedness  breathing problems  changes in hearing  changes in vision  chest pain  high blood pressure  low blood counts - This drug may decrease the number of white blood cells, red blood cells and platelets. You may be at increased risk for infections and bleeding.  nausea and vomiting  pain, swelling, redness or irritation at the injection site  pain, tingling, numbness in the hands or feet  problems with balance, talking, walking  trouble passing urine or change in the amount of urine Side effects that usually do not require medical attention (report to your doctor or health care professional if they continue or are bothersome):  hair loss  loss of appetite  metallic taste in the mouth or changes in taste This list may not describe all possible side effects. Call your doctor for medical advice about side effects. You may report side effects to FDA at 1-800-FDA-1088. Where should I keep my medicine? This drug is given in a hospital or clinic and will not be stored at home. NOTE: This sheet is a summary. It may not cover all possible information. If you have questions about this medicine, talk to your doctor, pharmacist, or health care provider.  2020 Elsevier/Gold Standard (2007-10-03 14:38:05)  

## 2019-01-29 NOTE — Telephone Encounter (Signed)
I have tried to contact PT to confirm ride for tomorrow and no answer therefore ride can not be confirmed

## 2019-01-30 ENCOUNTER — Other Ambulatory Visit: Payer: Self-pay

## 2019-01-30 ENCOUNTER — Ambulatory Visit
Admission: RE | Admit: 2019-01-30 | Discharge: 2019-01-30 | Disposition: A | Discharge status: Home / Self Care | Payer: Medicaid Other | Source: Ambulatory Visit | Attending: Radiation Oncology | Admitting: Radiation Oncology

## 2019-01-30 ENCOUNTER — Telehealth: Payer: Self-pay | Admitting: Medical Oncology

## 2019-01-30 DIAGNOSIS — Z510 Encounter for antineoplastic radiation therapy: Secondary | ICD-10-CM | POA: Diagnosis not present

## 2019-01-30 DIAGNOSIS — C3431 Malignant neoplasm of lower lobe, right bronchus or lung: Secondary | ICD-10-CM | POA: Diagnosis not present

## 2019-01-30 NOTE — Telephone Encounter (Addendum)
Chemo f/u-doing well. No complaints .Waiting for his ride to xrt. No nausea. Compazine rx is $17. Message to financial assistants for coverage.

## 2019-01-30 NOTE — Telephone Encounter (Deleted)
Pt reports he is feeling well. He slept last night and is drinking and eating today .Denies nausea/vomiting/diarrhea/constipation. IV site without problems. Instructed to drink at least 64-80oz of non caffeine fluids for the next 48  hours.

## 2019-01-31 ENCOUNTER — Other Ambulatory Visit: Payer: Self-pay

## 2019-01-31 ENCOUNTER — Ambulatory Visit
Admission: RE | Admit: 2019-01-31 | Discharge: 2019-01-31 | Disposition: A | Discharge status: Home / Self Care | Payer: Medicaid Other | Source: Ambulatory Visit | Attending: Radiation Oncology | Admitting: Radiation Oncology

## 2019-01-31 ENCOUNTER — Encounter: Payer: Self-pay | Admitting: Internal Medicine

## 2019-01-31 DIAGNOSIS — C3431 Malignant neoplasm of lower lobe, right bronchus or lung: Secondary | ICD-10-CM | POA: Diagnosis not present

## 2019-01-31 DIAGNOSIS — Z510 Encounter for antineoplastic radiation therapy: Secondary | ICD-10-CM | POA: Diagnosis not present

## 2019-01-31 NOTE — Progress Notes (Signed)
Called patient and left message regarding what is needed to apply for grant to assist with medications. Left my contact name and number.

## 2019-02-01 ENCOUNTER — Other Ambulatory Visit: Payer: Self-pay

## 2019-02-01 ENCOUNTER — Ambulatory Visit
Admission: RE | Admit: 2019-02-01 | Discharge: 2019-02-01 | Disposition: A | Discharge status: Home / Self Care | Payer: Medicaid Other | Source: Ambulatory Visit | Attending: Radiation Oncology | Admitting: Radiation Oncology

## 2019-02-01 DIAGNOSIS — C3431 Malignant neoplasm of lower lobe, right bronchus or lung: Secondary | ICD-10-CM | POA: Diagnosis not present

## 2019-02-01 DIAGNOSIS — Z510 Encounter for antineoplastic radiation therapy: Secondary | ICD-10-CM | POA: Diagnosis not present

## 2019-02-02 ENCOUNTER — Other Ambulatory Visit: Payer: Self-pay

## 2019-02-02 ENCOUNTER — Ambulatory Visit
Admission: RE | Admit: 2019-02-02 | Discharge: 2019-02-02 | Disposition: A | Discharge status: Home / Self Care | Payer: Medicaid Other | Source: Ambulatory Visit | Attending: Radiation Oncology | Admitting: Radiation Oncology

## 2019-02-02 DIAGNOSIS — C3431 Malignant neoplasm of lower lobe, right bronchus or lung: Secondary | ICD-10-CM | POA: Diagnosis not present

## 2019-02-02 DIAGNOSIS — Z510 Encounter for antineoplastic radiation therapy: Secondary | ICD-10-CM | POA: Diagnosis not present

## 2019-02-02 DIAGNOSIS — C3491 Malignant neoplasm of unspecified part of right bronchus or lung: Secondary | ICD-10-CM

## 2019-02-02 MED ORDER — SONAFINE EX EMUL
1.0000 "application " | Freq: Once | CUTANEOUS | Status: AC
  Administered 2019-02-02: 1 via TOPICAL

## 2019-02-02 NOTE — Progress Notes (Signed)
Pt here for patient teaching.  Pt given Radiation and You booklet and Sonafine.  Reviewed areas of pertinence such as fatigue, hair loss, skin changes, throat changes, cough and shortness of breath . Pt able to give teach back of to pat skin, use unscented/gentle soap and drink plenty of water,apply Sonafine bid and avoid applying anything to skin within 4 hours of treatment. Pt verbalized understanding of information given and will contact nursing with any questions or concerns.     Tyler [name]. Leonie Green, BSN

## 2019-02-05 ENCOUNTER — Inpatient Hospital Stay (HOSPITAL_BASED_OUTPATIENT_CLINIC_OR_DEPARTMENT_OTHER): Payer: Medicaid Other | Admitting: Internal Medicine

## 2019-02-05 ENCOUNTER — Ambulatory Visit
Admission: RE | Admit: 2019-02-05 | Discharge: 2019-02-05 | Disposition: A | Discharge status: Home / Self Care | Payer: Medicaid Other | Source: Ambulatory Visit | Attending: Radiation Oncology | Admitting: Radiation Oncology

## 2019-02-05 ENCOUNTER — Inpatient Hospital Stay: Payer: Medicaid Other

## 2019-02-05 ENCOUNTER — Other Ambulatory Visit: Payer: Self-pay

## 2019-02-05 ENCOUNTER — Telehealth: Payer: Self-pay | Admitting: *Deleted

## 2019-02-05 ENCOUNTER — Encounter: Payer: Self-pay | Admitting: Internal Medicine

## 2019-02-05 VITALS — BP 132/88 | HR 87 | Temp 98.2°F | Resp 18 | Ht 74.0 in | Wt 136.3 lb

## 2019-02-05 DIAGNOSIS — J439 Emphysema, unspecified: Secondary | ICD-10-CM

## 2019-02-05 DIAGNOSIS — M549 Dorsalgia, unspecified: Secondary | ICD-10-CM | POA: Diagnosis not present

## 2019-02-05 DIAGNOSIS — C3491 Malignant neoplasm of unspecified part of right bronchus or lung: Secondary | ICD-10-CM

## 2019-02-05 DIAGNOSIS — K760 Fatty (change of) liver, not elsewhere classified: Secondary | ICD-10-CM | POA: Diagnosis not present

## 2019-02-05 DIAGNOSIS — C3431 Malignant neoplasm of lower lobe, right bronchus or lung: Secondary | ICD-10-CM | POA: Diagnosis not present

## 2019-02-05 DIAGNOSIS — Z5111 Encounter for antineoplastic chemotherapy: Secondary | ICD-10-CM

## 2019-02-05 DIAGNOSIS — R072 Precordial pain: Secondary | ICD-10-CM

## 2019-02-05 DIAGNOSIS — R634 Abnormal weight loss: Secondary | ICD-10-CM

## 2019-02-05 DIAGNOSIS — Z79899 Other long term (current) drug therapy: Secondary | ICD-10-CM

## 2019-02-05 DIAGNOSIS — G8929 Other chronic pain: Secondary | ICD-10-CM | POA: Diagnosis not present

## 2019-02-05 DIAGNOSIS — R110 Nausea: Secondary | ICD-10-CM | POA: Diagnosis not present

## 2019-02-05 DIAGNOSIS — Z8249 Family history of ischemic heart disease and other diseases of the circulatory system: Secondary | ICD-10-CM

## 2019-02-05 DIAGNOSIS — J449 Chronic obstructive pulmonary disease, unspecified: Secondary | ICD-10-CM | POA: Diagnosis not present

## 2019-02-05 DIAGNOSIS — Z510 Encounter for antineoplastic radiation therapy: Secondary | ICD-10-CM | POA: Diagnosis not present

## 2019-02-05 DIAGNOSIS — I700 Atherosclerosis of aorta: Secondary | ICD-10-CM | POA: Diagnosis not present

## 2019-02-05 DIAGNOSIS — R1010 Upper abdominal pain, unspecified: Secondary | ICD-10-CM

## 2019-02-05 DIAGNOSIS — F17210 Nicotine dependence, cigarettes, uncomplicated: Secondary | ICD-10-CM

## 2019-02-05 DIAGNOSIS — R5383 Other fatigue: Secondary | ICD-10-CM | POA: Diagnosis not present

## 2019-02-05 LAB — CBC WITH DIFFERENTIAL (CANCER CENTER ONLY)
Abs Immature Granulocytes: 0.06 10*3/uL (ref 0.00–0.07)
Basophils Absolute: 0 10*3/uL (ref 0.0–0.1)
Basophils Relative: 1 %
Eosinophils Absolute: 0 10*3/uL (ref 0.0–0.5)
Eosinophils Relative: 0 %
HCT: 33.4 % — ABNORMAL LOW (ref 39.0–52.0)
Hemoglobin: 11 g/dL — ABNORMAL LOW (ref 13.0–17.0)
Immature Granulocytes: 1 %
Lymphocytes Relative: 26 %
Lymphs Abs: 1.2 10*3/uL (ref 0.7–4.0)
MCH: 24.4 pg — ABNORMAL LOW (ref 26.0–34.0)
MCHC: 32.9 g/dL (ref 30.0–36.0)
MCV: 74.1 fL — ABNORMAL LOW (ref 80.0–100.0)
Monocytes Absolute: 0.3 10*3/uL (ref 0.1–1.0)
Monocytes Relative: 7 %
Neutro Abs: 3.1 10*3/uL (ref 1.7–7.7)
Neutrophils Relative %: 65 %
Platelet Count: 135 10*3/uL — ABNORMAL LOW (ref 150–400)
RBC: 4.51 MIL/uL (ref 4.22–5.81)
RDW: 14.4 % (ref 11.5–15.5)
WBC Count: 4.7 10*3/uL (ref 4.0–10.5)
nRBC: 0 % (ref 0.0–0.2)

## 2019-02-05 LAB — CMP (CANCER CENTER ONLY)
ALT: 35 U/L (ref 0–44)
AST: 38 U/L (ref 15–41)
Albumin: 3.8 g/dL (ref 3.5–5.0)
Alkaline Phosphatase: 90 U/L (ref 38–126)
Anion gap: 10 (ref 5–15)
BUN: 10 mg/dL (ref 6–20)
CO2: 28 mmol/L (ref 22–32)
Calcium: 9.3 mg/dL (ref 8.9–10.3)
Chloride: 96 mmol/L — ABNORMAL LOW (ref 98–111)
Creatinine: 0.72 mg/dL (ref 0.61–1.24)
GFR, Est AFR Am: >60 mL/min (ref >60)
GFR, Estimated: >60 mL/min (ref >60)
Glucose, Bld: 91 mg/dL (ref 70–99)
Potassium: 3.8 mmol/L (ref 3.5–5.1)
Sodium: 134 mmol/L — ABNORMAL LOW (ref 135–145)
Total Bilirubin: 1.1 mg/dL (ref 0.3–1.2)
Total Protein: 7.7 g/dL (ref 6.5–8.1)

## 2019-02-05 MED ORDER — DIPHENHYDRAMINE HCL 50 MG/ML IJ SOLN
INTRAMUSCULAR | Status: AC
  Filled 2019-02-05: qty 1

## 2019-02-05 MED ORDER — FAMOTIDINE IN NACL 20-0.9 MG/50ML-% IV SOLN
INTRAVENOUS | Status: AC
  Filled 2019-02-05: qty 50

## 2019-02-05 MED ORDER — SODIUM CHLORIDE 0.9 % IV SOLN: 222.2000 mg | Freq: Once | INTRAVENOUS | Status: DC

## 2019-02-05 MED ORDER — SODIUM CHLORIDE 0.9 % IV SOLN
20.0000 mg | Freq: Once | INTRAVENOUS | Status: AC
  Administered 2019-02-05: 13:00:00 20 mg via INTRAVENOUS
  Filled 2019-02-05: qty 2

## 2019-02-05 MED ORDER — DIPHENHYDRAMINE HCL 50 MG/ML IJ SOLN
50.0000 mg | Freq: Once | INTRAMUSCULAR | Status: AC
  Administered 2019-02-05: 50 mg via INTRAVENOUS

## 2019-02-05 MED ORDER — SODIUM CHLORIDE 0.9 % IV SOLN
45.0000 mg/m2 | Freq: Once | INTRAVENOUS | Status: AC
  Administered 2019-02-05: 84 mg via INTRAVENOUS
  Filled 2019-02-05: qty 14

## 2019-02-05 MED ORDER — SODIUM CHLORIDE 0.9 % IV SOLN
Freq: Once | INTRAVENOUS | Status: AC
  Administered 2019-02-05: 12:00:00 via INTRAVENOUS
  Filled 2019-02-05: qty 250

## 2019-02-05 MED ORDER — FAMOTIDINE IN NACL 20-0.9 MG/50ML-% IV SOLN
20.0000 mg | Freq: Once | INTRAVENOUS | Status: AC
  Administered 2019-02-05: 20 mg via INTRAVENOUS

## 2019-02-05 MED ORDER — PALONOSETRON HCL INJECTION 0.25 MG/5ML
0.2500 mg | Freq: Once | INTRAVENOUS | Status: AC
  Administered 2019-02-05: 0.25 mg via INTRAVENOUS

## 2019-02-05 MED ORDER — PALONOSETRON HCL INJECTION 0.25 MG/5ML
INTRAVENOUS | Status: AC
  Filled 2019-02-05: qty 5

## 2019-02-05 MED ORDER — SODIUM CHLORIDE 0.9 % IV SOLN
222.2000 mg | Freq: Once | INTRAVENOUS | Status: AC
  Administered 2019-02-05: 220 mg via INTRAVENOUS
  Filled 2019-02-05: qty 22

## 2019-02-05 NOTE — Patient Instructions (Signed)
   [facility] Discharge Instructions for Patients Receiving Chemotherapy  Today you received the following chemotherapy agents Taxol and Carboplatin   To help prevent nausea and vomiting after your treatment, we encourage you to take your nausea medication as directed.    If you develop nausea and vomiting that is not controlled by your nausea medication, call the clinic.   BELOW ARE SYMPTOMS THAT SHOULD BE REPORTED IMMEDIATELY:  *FEVER GREATER THAN 100.5 F  *CHILLS WITH OR WITHOUT FEVER  NAUSEA AND VOMITING THAT IS NOT CONTROLLED WITH YOUR NAUSEA MEDICATION  *UNUSUAL SHORTNESS OF BREATH  *UNUSUAL BRUISING OR BLEEDING  TENDERNESS IN MOUTH AND THROAT WITH OR WITHOUT PRESENCE OF ULCERS  *URINARY PROBLEMS  *BOWEL PROBLEMS  UNUSUAL RASH Items with * indicate a potential emergency and should be followed up as soon as possible.  Feel free to call the clinic should you have any questions or concerns. The clinic phone number is [phone].  Please show the CHEMO ALERT CARD at check-in to the Emergency Department and triage nurse.   

## 2019-02-05 NOTE — Progress Notes (Signed)
[facility] [phone]   [phone]  OFFICE PROGRESS NOTE  Brand Males, MD [place] [address]  DIAGNOSIS: stage IIIa (T1b, and 2, M0) non-small cell lung cancer, adenocarcinoma diagnosed in July 2020.  This could be stage IV disease if the bilateral pulmonary nodules are consistent with malignancy but they are below the detection level for a PET scan.  He presented with right lower lobe pulmonary nodule in addition to subcarinal lymphadenopathy.  PRIOR THERAPY: None.  CURRENT THERAPY: A course of concurrent chemoradiation with weekly carboplatin for AUC of 2 and paclitaxel 45 mg/M2.  Status post 1 cycle.  INTERVAL HISTORY: Tyler [name] 58 y.o. male returns to the clinic today for follow-up visit.  The patient is feeling fine today with no concerning complaints.  He tolerated the first week of his treatment well with no significant adverse effects. He denied having any chest pain, shortness of breath, cough or hemoptysis.  He denied having any fever or chills.  He has no nausea, vomiting, diarrhea or constipation.  He denied having any headache or visual changes.  He is here today for evaluation before starting cycle #2.  MEDICAL HISTORY: Past Medical History:  Diagnosis Date  . Alcohol abuse   . Chronic back pain   . COPD (chronic obstructive pulmonary disease) ([facility])   . Dyspnea   . Lung mass    mediastinal adenopathy  . Lung nodule   . Multiple pulmonary nodules 04/02/2018  . Pneumonia   . Right lower lobe pulmonary nodule 04/02/2018   12/01/2017: PET SUV 2.1  . Tobacco abuse     ALLERGIES:  has No Known Allergies.  MEDICATIONS:  Current Outpatient Medications  Medication Sig Dispense Refill  . albuterol (PROVENTIL HFA;VENTOLIN HFA) 108 (90 Base) MCG/ACT inhaler Inhale 2 puffs into the lungs every 6 (six) hours as needed for wheezing or shortness of breath. 1 Inhaler 3  . feeding supplement, ENSURE  ENLIVE, (ENSURE ENLIVE) LIQD Take 237 mLs by mouth 3 (three) times daily between meals. (Patient taking differently: Take 237 mLs by mouth daily. ) 90 Bottle 0  . naproxen sodium (ALEVE) 220 MG tablet Take 220 mg by mouth daily as needed.    . prochlorperazine (COMPAZINE) 10 MG tablet Take 1 tablet (10 mg total) by mouth every 6 (six) hours as needed for nausea or vomiting. 30 tablet 0  . tiotropium (SPIRIVA HANDIHALER) 18 MCG inhalation capsule Place 1 capsule (18 mcg total) into inhaler and inhale daily. 90 capsule 3   No current facility-administered medications for this visit.     SURGICAL HISTORY:  Past Surgical History:  Procedure Laterality Date  . APPENDECTOMY    . VIDEO BRONCHOSCOPY WITH ENDOBRONCHIAL NAVIGATION N/A 04/05/2018   Procedure: VIDEO BRONCHOSCOPY WITH ENDOBRONCHIAL NAVIGATION;  Surgeon: Garner Nash, DO;  Location: [facility];  Service: Thoracic;  Laterality: N/A;  . VIDEO BRONCHOSCOPY WITH ENDOBRONCHIAL NAVIGATION N/A 01/11/2019   Procedure: VIDEO BRONCHOSCOPY WITH ENDOBRONCHIAL NAVIGATION;  Surgeon: Garner Nash, DO;  Location: [facility];  Service: Thoracic;  Laterality: N/A;  . VIDEO BRONCHOSCOPY WITH ENDOBRONCHIAL ULTRASOUND N/A 01/11/2019   Procedure: VIDEO BRONCHOSCOPY WITH ENDOBRONCHIAL ULTRASOUND;  Surgeon: Garner Nash, DO;  Location: [facility];  Service: Thoracic;  Laterality: N/A;  . VIDEO BRONCHOSCOPY WITH RADIAL ENDOBRONCHIAL ULTRASOUND N/A 01/11/2019   Procedure: VIDEO BRONCHOSCOPY WITH RADIAL ENDOBRONCHIAL ULTRASOUND;  Surgeon: Garner Nash, DO;  Location: [facility];  Service: Thoracic;  Laterality: N/A;  . WISDOM TOOTH EXTRACTION      REVIEW OF SYSTEMS:  A comprehensive review of systems was negative except for: Gastrointestinal: positive for diarrhea   PHYSICAL EXAMINATION: General appearance: alert, cooperative and no distress Head: Normocephalic, without obvious abnormality, atraumatic Neck: no adenopathy, no JVD, supple, symmetrical, trachea midline and  thyroid not enlarged, symmetric, no tenderness/mass/nodules Lymph nodes: Cervical, supraclavicular, and axillary nodes normal. Resp: clear to auscultation bilaterally Back: symmetric, no curvature. ROM normal. No CVA tenderness. Cardio: regular rate and rhythm, S1, S2 normal, no murmur, click, rub or gallop GI: soft, non-tender; bowel sounds normal; no masses,  no organomegaly Extremities: extremities normal, atraumatic, no cyanosis or edema  ECOG PERFORMANCE STATUS: 1 - Symptomatic but completely ambulatory  Blood pressure 132/88, pulse 87, temperature 98.2 F (36.8 C), temperature source Oral, resp. rate 18, height 6\' 2"  (1.88 m), weight 136 lb 4.8 oz (61.8 kg), SpO2 100 %.  LABORATORY DATA: Lab Results  Component Value Date   WBC 4.7 02/05/2019   HGB 11.0 (L) 02/05/2019   HCT 33.4 (L) 02/05/2019   MCV 74.1 (L) 02/05/2019   PLT 135 (L) 02/05/2019      Chemistry      Component Value Date/Time   NA 139 01/29/2019 0819   K 3.9 01/29/2019 0819   CL 102 01/29/2019 0819   CO2 25 01/29/2019 0819   BUN 8 01/29/2019 0819   CREATININE 0.79 01/29/2019 0819      Component Value Date/Time   CALCIUM 8.6 (L) 01/29/2019 0819   ALKPHOS 106 01/29/2019 0819   AST 67 (H) 01/29/2019 0819   ALT 45 (H) 01/29/2019 0819   BILITOT 1.0 01/29/2019 0819       RADIOGRAPHIC STUDIES: Mr Jeri Cos VH Contrast  Result Date: 01/19/2019 CLINICAL DATA:  Staging of non-small cell lung cancer. EXAM: MRI HEAD WITHOUT AND WITH CONTRAST TECHNIQUE: Multiplanar, multiecho pulse sequences of the brain and surrounding structures were obtained without and with intravenous contrast. CONTRAST:  7 cc Gadavist COMPARISON:  None. FINDINGS: Brain: Diffusion imaging does not show any acute or subacute infarction. The brainstem and cerebellum are normal. Cerebral hemispheres show moderate chronic small-vessel ischemic changes of the deep white matter. No cortical or large vessel territory infarction. No mass lesion,  hemorrhage, hydrocephalus or extra-axial collection. No abnormal contrast enhancement occurs. Vascular: Major vessels at the base of the brain show flow. Skull and upper cervical spine: Negative Sinuses/Orbits: Clear/normal Other: None IMPRESSION: No evidence of metastatic disease. Moderate chronic small-vessel ischemic changes of the cerebral hemispheric deep white matter. Electronically Signed   By: Nelson Chimes M.D.   On: 01/19/2019 09:06   Dg Chest Port 1 View  Result Date: 01/11/2019 CLINICAL DATA:  Status post bronchoscopy EXAM: PORTABLE CHEST 1 VIEW COMPARISON:  None. FINDINGS: The heart size and mediastinal contours are within normal limits. No pneumothorax. Both lungs are clear. The visualized skeletal structures are unremarkable. IMPRESSION: No pneumothorax. Electronically Signed   By: Ulyses Jarred M.D.   On: 01/11/2019 18:21   Ct Super D Chest Wo Contrast  Result Date: 01/08/2019 CLINICAL DATA:  Lung mass. EXAM: CT CHEST WITHOUT CONTRAST TECHNIQUE: Multidetector CT imaging of the chest was performed using thin slice collimation for electromagnetic bronchoscopy planning purposes, without intravenous contrast. COMPARISON:  12/27/2018 FINDINGS: Cardiovascular: The heart size appears within normal limits. There is no pericardial effusion identified. Aortic atherosclerosis. Left circumflex coronary artery and RCA calcifications. Mediastinum/Nodes: Normal appearance of the thyroid gland. The trachea appears patent and  is midline. Normal appearance of the esophagus. No enlarged mediastinal or hilar lymph nodes. No enlarged mediastinal or hilar lymph nodes. Lungs/Pleura: Moderate changes of centrilobular and paraseptal emphysema. The central right lower lobe lung mass measures 2.6 by 2.0 cm, image 112/3. This is compared with 2.6 x 1.7 cm previously. Scattered cavitary and cavitary lung nodules are again noted. -Stable index right upper lobe lung nodule measures 0.9 cm, image 36/3. -Irregular spiculated  nodule within the posterior left lower lobe measures 0.7 cm, image 90/3. Unchanged. -Cavitary nodule within the posterior right upper lobe measures 0.7 cm, image 56/3. Unchanged. -Cavitary lung nodule within the posterior left upper lobe measures 0.8 cm, image 46/3. Previously 0.4 cm. -Posteromedial right lower lobe solid nodule measures 5 mm, image 120/3. Unchanged. Upper Abdomen: No acute abnormality. Musculoskeletal: No chest wall mass or suspicious bone lesions identified. IMPRESSION: 1. No significant change in the appearance of dominant central right lower lobe lung mass. 2. Scattered solid and cavitary lung nodules also appears similar to previous study. 3. Aortic Atherosclerosis (ICD10-I70.0) and Emphysema (ICD10-J43.9). Coronary artery calcifications. Electronically Signed   By: Kerby Moors M.D.   On: 01/08/2019 14:16   Dg C-arm Bronchoscopy  Result Date: 01/11/2019 C-ARM BRONCHOSCOPY: Fluoroscopy was utilized by the requesting physician.  No radiographic interpretation.    ASSESSMENT AND PLAN: This is a very pleasant 58 years old African-American male recently diagnosed with a stage IIIa non-small cell lung cancer, adenocarcinoma.  He is currently undergoing a course of concurrent chemoradiation with weekly carboplatin and paclitaxel status post 1 cycle.  He tolerated the first cycle of his treatment well with no concerning adverse effects. I recommended for the patient to proceed with cycle #2 today as planned. We will see him back for follow-up visit in 2 weeks for evaluation before the next cycle of his treatment. For the nausea, he has a prescription for Compazine to be used on as-needed basis. The patient was advised to call immediately if he has any concerning symptoms in the interval. The patient voices understanding of current disease status and treatment options and is in agreement with the current care plan.  All questions were answered. The patient knows to call the clinic with  any problems, questions or concerns. We can certainly see the patient much sooner if necessary.  I spent 10 minutes counseling the patient face to face. The total time spent in the appointment was 15 minutes.  Disclaimer: This note was dictated with voice recognition software. Similar sounding words can inadvertently be transcribed and may not be corrected upon review.

## 2019-02-05 NOTE — Telephone Encounter (Signed)
Was unable to touch base with patient for first treatment & missed him today.  Called pt to see if he had questions/concerns about treatment.  He informs me that he does not & understands teaching that was done.  Informed pt that I would leave his Pt Edu packet at front desk for his review to be p/u tomorrow when he comes in.  He expressed understanding & informed to call with any questions.

## 2019-02-06 ENCOUNTER — Other Ambulatory Visit: Payer: Self-pay

## 2019-02-06 ENCOUNTER — Ambulatory Visit
Admission: RE | Admit: 2019-02-06 | Discharge: 2019-02-06 | Disposition: A | Discharge status: Home / Self Care | Payer: Medicaid Other | Source: Ambulatory Visit | Attending: Radiation Oncology | Admitting: Radiation Oncology

## 2019-02-06 DIAGNOSIS — Z510 Encounter for antineoplastic radiation therapy: Secondary | ICD-10-CM | POA: Diagnosis not present

## 2019-02-06 DIAGNOSIS — C3431 Malignant neoplasm of lower lobe, right bronchus or lung: Secondary | ICD-10-CM | POA: Diagnosis not present

## 2019-02-07 ENCOUNTER — Other Ambulatory Visit: Payer: Self-pay

## 2019-02-07 ENCOUNTER — Ambulatory Visit
Admission: RE | Admit: 2019-02-07 | Discharge: 2019-02-07 | Disposition: A | Discharge status: Home / Self Care | Payer: Medicaid Other | Source: Ambulatory Visit | Attending: Radiation Oncology | Admitting: Radiation Oncology

## 2019-02-07 DIAGNOSIS — Z510 Encounter for antineoplastic radiation therapy: Secondary | ICD-10-CM | POA: Diagnosis not present

## 2019-02-07 DIAGNOSIS — C3431 Malignant neoplasm of lower lobe, right bronchus or lung: Secondary | ICD-10-CM | POA: Diagnosis not present

## 2019-02-08 ENCOUNTER — Encounter: Payer: Self-pay | Admitting: Nutrition

## 2019-02-08 ENCOUNTER — Ambulatory Visit
Admission: RE | Admit: 2019-02-08 | Discharge: 2019-02-08 | Disposition: A | Discharge status: Home / Self Care | Payer: Medicaid Other | Source: Ambulatory Visit | Attending: Radiation Oncology | Admitting: Radiation Oncology

## 2019-02-08 ENCOUNTER — Other Ambulatory Visit: Payer: Self-pay

## 2019-02-08 DIAGNOSIS — Z510 Encounter for antineoplastic radiation therapy: Secondary | ICD-10-CM | POA: Diagnosis not present

## 2019-02-08 DIAGNOSIS — C3431 Malignant neoplasm of lower lobe, right bronchus or lung: Secondary | ICD-10-CM | POA: Diagnosis not present

## 2019-02-08 NOTE — Progress Notes (Unsigned)
Provided 1 complementary case of vanilla Ensure Enlive.

## 2019-02-09 ENCOUNTER — Ambulatory Visit
Admission: RE | Admit: 2019-02-09 | Discharge: 2019-02-09 | Disposition: A | Discharge status: Home / Self Care | Payer: Medicaid Other | Source: Ambulatory Visit | Attending: Radiation Oncology | Admitting: Radiation Oncology

## 2019-02-09 ENCOUNTER — Other Ambulatory Visit: Payer: Self-pay

## 2019-02-09 DIAGNOSIS — Z510 Encounter for antineoplastic radiation therapy: Secondary | ICD-10-CM | POA: Diagnosis not present

## 2019-02-09 DIAGNOSIS — C3431 Malignant neoplasm of lower lobe, right bronchus or lung: Secondary | ICD-10-CM | POA: Diagnosis not present

## 2019-02-12 ENCOUNTER — Inpatient Hospital Stay: Payer: Medicaid Other | Attending: Internal Medicine

## 2019-02-12 ENCOUNTER — Other Ambulatory Visit: Payer: Self-pay

## 2019-02-12 ENCOUNTER — Ambulatory Visit
Admission: RE | Admit: 2019-02-12 | Discharge: 2019-02-12 | Disposition: A | Discharge status: Home / Self Care | Payer: Medicaid Other | Source: Ambulatory Visit | Attending: Radiation Oncology | Admitting: Radiation Oncology

## 2019-02-12 ENCOUNTER — Inpatient Hospital Stay: Payer: Medicaid Other

## 2019-02-12 VITALS — BP 119/84 | HR 75 | Temp 98.7°F | Resp 16

## 2019-02-12 DIAGNOSIS — Z79899 Other long term (current) drug therapy: Secondary | ICD-10-CM | POA: Diagnosis not present

## 2019-02-12 DIAGNOSIS — C3491 Malignant neoplasm of unspecified part of right bronchus or lung: Secondary | ICD-10-CM

## 2019-02-12 DIAGNOSIS — Z5111 Encounter for antineoplastic chemotherapy: Secondary | ICD-10-CM | POA: Insufficient documentation

## 2019-02-12 DIAGNOSIS — C3431 Malignant neoplasm of lower lobe, right bronchus or lung: Secondary | ICD-10-CM | POA: Insufficient documentation

## 2019-02-12 DIAGNOSIS — D6481 Anemia due to antineoplastic chemotherapy: Secondary | ICD-10-CM | POA: Diagnosis not present

## 2019-02-12 DIAGNOSIS — Z510 Encounter for antineoplastic radiation therapy: Secondary | ICD-10-CM | POA: Insufficient documentation

## 2019-02-12 DIAGNOSIS — J449 Chronic obstructive pulmonary disease, unspecified: Secondary | ICD-10-CM | POA: Insufficient documentation

## 2019-02-12 DIAGNOSIS — R1310 Dysphagia, unspecified: Secondary | ICD-10-CM | POA: Insufficient documentation

## 2019-02-12 DIAGNOSIS — F17210 Nicotine dependence, cigarettes, uncomplicated: Secondary | ICD-10-CM | POA: Insufficient documentation

## 2019-02-12 LAB — CBC WITH DIFFERENTIAL (CANCER CENTER ONLY)
Abs Immature Granulocytes: 0.04 10*3/uL (ref 0.00–0.07)
Basophils Absolute: 0 10*3/uL (ref 0.0–0.1)
Basophils Relative: 1 %
Eosinophils Absolute: 0 10*3/uL (ref 0.0–0.5)
Eosinophils Relative: 1 %
HCT: 27.4 % — ABNORMAL LOW (ref 39.0–52.0)
Hemoglobin: 9.4 g/dL — ABNORMAL LOW (ref 13.0–17.0)
Immature Granulocytes: 1 %
Lymphocytes Relative: 27 %
Lymphs Abs: 0.8 10*3/uL (ref 0.7–4.0)
MCH: 24.9 pg — ABNORMAL LOW (ref 26.0–34.0)
MCHC: 34.3 g/dL (ref 30.0–36.0)
MCV: 72.5 fL — ABNORMAL LOW (ref 80.0–100.0)
Monocytes Absolute: 0.3 10*3/uL (ref 0.1–1.0)
Monocytes Relative: 12 %
Neutro Abs: 1.7 10*3/uL (ref 1.7–7.7)
Neutrophils Relative %: 58 %
Platelet Count: 142 10*3/uL — ABNORMAL LOW (ref 150–400)
RBC: 3.78 MIL/uL — ABNORMAL LOW (ref 4.22–5.81)
RDW: 14.2 % (ref 11.5–15.5)
WBC Count: 2.9 10*3/uL — ABNORMAL LOW (ref 4.0–10.5)
nRBC: 1.7 % — ABNORMAL HIGH (ref 0.0–0.2)

## 2019-02-12 LAB — CMP (CANCER CENTER ONLY)
ALT: 23 U/L (ref 0–44)
AST: 29 U/L (ref 15–41)
Albumin: 3.5 g/dL (ref 3.5–5.0)
Alkaline Phosphatase: 79 U/L (ref 38–126)
Anion gap: 10 (ref 5–15)
BUN: 6 mg/dL (ref 6–20)
CO2: 27 mmol/L (ref 22–32)
Calcium: 8.2 mg/dL — ABNORMAL LOW (ref 8.9–10.3)
Chloride: 100 mmol/L (ref 98–111)
Creatinine: 0.62 mg/dL (ref 0.61–1.24)
GFR, Est AFR Am: >60 mL/min (ref >60)
GFR, Estimated: >60 mL/min (ref >60)
Glucose, Bld: 98 mg/dL (ref 70–99)
Potassium: 3.9 mmol/L (ref 3.5–5.1)
Sodium: 137 mmol/L (ref 135–145)
Total Bilirubin: 0.5 mg/dL (ref 0.3–1.2)
Total Protein: 6.9 g/dL (ref 6.5–8.1)

## 2019-02-12 MED ORDER — SODIUM CHLORIDE 0.9 % IV SOLN
20.0000 mg | Freq: Once | INTRAVENOUS | Status: AC
  Administered 2019-02-12: 15:00:00 20 mg via INTRAVENOUS
  Filled 2019-02-12: qty 20

## 2019-02-12 MED ORDER — PALONOSETRON HCL INJECTION 0.25 MG/5ML
0.2500 mg | Freq: Once | INTRAVENOUS | Status: AC
  Administered 2019-02-12: 14:00:00 0.25 mg via INTRAVENOUS

## 2019-02-12 MED ORDER — FAMOTIDINE IN NACL 20-0.9 MG/50ML-% IV SOLN
INTRAVENOUS | Status: AC
  Filled 2019-02-12: qty 50

## 2019-02-12 MED ORDER — PALONOSETRON HCL INJECTION 0.25 MG/5ML
INTRAVENOUS | Status: AC
  Filled 2019-02-12: qty 5

## 2019-02-12 MED ORDER — SODIUM CHLORIDE 0.9 % IV SOLN
Freq: Once | INTRAVENOUS | Status: AC
  Administered 2019-02-12: 14:00:00 via INTRAVENOUS
  Filled 2019-02-12: qty 250

## 2019-02-12 MED ORDER — DIPHENHYDRAMINE HCL 50 MG/ML IJ SOLN
50.0000 mg | Freq: Once | INTRAMUSCULAR | Status: AC
  Administered 2019-02-12: 50 mg via INTRAVENOUS

## 2019-02-12 MED ORDER — FAMOTIDINE IN NACL 20-0.9 MG/50ML-% IV SOLN
20.0000 mg | Freq: Once | INTRAVENOUS | Status: AC
  Administered 2019-02-12: 14:00:00 20 mg via INTRAVENOUS

## 2019-02-12 MED ORDER — SODIUM CHLORIDE 0.9 % IV SOLN
220.0000 mg | Freq: Once | INTRAVENOUS | Status: AC
  Administered 2019-02-12: 220 mg via INTRAVENOUS
  Filled 2019-02-12: qty 22

## 2019-02-12 MED ORDER — SODIUM CHLORIDE 0.9 % IV SOLN
45.0000 mg/m2 | Freq: Once | INTRAVENOUS | Status: AC
  Administered 2019-02-12: 15:00:00 84 mg via INTRAVENOUS
  Filled 2019-02-12: qty 14

## 2019-02-12 MED ORDER — DIPHENHYDRAMINE HCL 50 MG/ML IJ SOLN
INTRAMUSCULAR | Status: AC
  Filled 2019-02-12: qty 1

## 2019-02-12 NOTE — Patient Instructions (Signed)
[facility] Discharge Instructions for Patients Receiving Chemotherapy  Today you received the following chemotherapy agents:  Taxol, Carboplatin  To help prevent nausea and vomiting after your treatment, we encourage you to take your nausea medication as prescribed.   If you develop nausea and vomiting that is not controlled by your nausea medication, call the clinic.   BELOW ARE SYMPTOMS THAT SHOULD BE REPORTED IMMEDIATELY:  *FEVER GREATER THAN 100.5 F  *CHILLS WITH OR WITHOUT FEVER  NAUSEA AND VOMITING THAT IS NOT CONTROLLED WITH YOUR NAUSEA MEDICATION  *UNUSUAL SHORTNESS OF BREATH  *UNUSUAL BRUISING OR BLEEDING  TENDERNESS IN MOUTH AND THROAT WITH OR WITHOUT PRESENCE OF ULCERS  *URINARY PROBLEMS  *BOWEL PROBLEMS  UNUSUAL RASH Items with * indicate a potential emergency and should be followed up as soon as possible.  Feel free to call the clinic should you have any questions or concerns. The clinic phone number is [phone].  Please show the CHEMO ALERT CARD at check-in to the Emergency Department and triage nurse.   

## 2019-02-13 ENCOUNTER — Ambulatory Visit
Admission: RE | Admit: 2019-02-13 | Discharge: 2019-02-13 | Disposition: A | Discharge status: Home / Self Care | Payer: Medicaid Other | Source: Ambulatory Visit | Attending: Radiation Oncology | Admitting: Radiation Oncology

## 2019-02-13 ENCOUNTER — Other Ambulatory Visit: Payer: Self-pay

## 2019-02-13 DIAGNOSIS — C3431 Malignant neoplasm of lower lobe, right bronchus or lung: Secondary | ICD-10-CM | POA: Diagnosis not present

## 2019-02-13 DIAGNOSIS — Z510 Encounter for antineoplastic radiation therapy: Secondary | ICD-10-CM | POA: Diagnosis not present

## 2019-02-14 ENCOUNTER — Other Ambulatory Visit: Payer: Self-pay

## 2019-02-14 ENCOUNTER — Ambulatory Visit
Admission: RE | Admit: 2019-02-14 | Discharge: 2019-02-14 | Disposition: A | Discharge status: Home / Self Care | Payer: Medicaid Other | Source: Ambulatory Visit | Attending: Radiation Oncology | Admitting: Radiation Oncology

## 2019-02-14 DIAGNOSIS — C3431 Malignant neoplasm of lower lobe, right bronchus or lung: Secondary | ICD-10-CM | POA: Diagnosis not present

## 2019-02-14 DIAGNOSIS — Z510 Encounter for antineoplastic radiation therapy: Secondary | ICD-10-CM | POA: Diagnosis not present

## 2019-02-15 ENCOUNTER — Ambulatory Visit
Admission: RE | Admit: 2019-02-15 | Discharge: 2019-02-15 | Disposition: A | Discharge status: Home / Self Care | Payer: Medicaid Other | Source: Ambulatory Visit | Attending: Radiation Oncology | Admitting: Radiation Oncology

## 2019-02-15 ENCOUNTER — Other Ambulatory Visit: Payer: Self-pay

## 2019-02-15 DIAGNOSIS — C3431 Malignant neoplasm of lower lobe, right bronchus or lung: Secondary | ICD-10-CM | POA: Diagnosis not present

## 2019-02-15 DIAGNOSIS — Z510 Encounter for antineoplastic radiation therapy: Secondary | ICD-10-CM | POA: Diagnosis not present

## 2019-02-16 ENCOUNTER — Ambulatory Visit
Admission: RE | Admit: 2019-02-16 | Discharge: 2019-02-16 | Disposition: A | Discharge status: Home / Self Care | Payer: Medicaid Other | Source: Ambulatory Visit | Attending: Radiation Oncology | Admitting: Radiation Oncology

## 2019-02-16 ENCOUNTER — Other Ambulatory Visit: Payer: Self-pay

## 2019-02-16 DIAGNOSIS — C3431 Malignant neoplasm of lower lobe, right bronchus or lung: Secondary | ICD-10-CM | POA: Diagnosis not present

## 2019-02-16 DIAGNOSIS — Z510 Encounter for antineoplastic radiation therapy: Secondary | ICD-10-CM | POA: Diagnosis not present

## 2019-02-19 ENCOUNTER — Ambulatory Visit: Attending: Cardiovascular Disease | Primary: Internal Medicine

## 2019-02-19 ENCOUNTER — Encounter: Payer: Self-pay | Admitting: Physician Assistant

## 2019-02-19 ENCOUNTER — Inpatient Hospital Stay: Payer: Medicaid Other

## 2019-02-19 ENCOUNTER — Ambulatory Visit (INDEPENDENT_AMBULATORY_CARE_PROVIDER_SITE_OTHER): Payer: Medicaid Other | Admitting: Pulmonary Disease

## 2019-02-19 ENCOUNTER — Inpatient Hospital Stay (HOSPITAL_BASED_OUTPATIENT_CLINIC_OR_DEPARTMENT_OTHER): Payer: Medicaid Other | Admitting: Physician Assistant

## 2019-02-19 ENCOUNTER — Encounter: Payer: Self-pay | Admitting: Pulmonary Disease

## 2019-02-19 ENCOUNTER — Ambulatory Visit
Admission: RE | Admit: 2019-02-19 | Discharge: 2019-02-19 | Disposition: A | Discharge status: Home / Self Care | Payer: Medicaid Other | Source: Ambulatory Visit | Attending: Radiation Oncology | Admitting: Radiation Oncology

## 2019-02-19 ENCOUNTER — Other Ambulatory Visit: Payer: Self-pay

## 2019-02-19 ENCOUNTER — Ambulatory Visit
Admit: 2019-02-19 | Discharge: 2019-02-19 | Payer: BLUE CROSS/BLUE SHIELD | Attending: Cardiovascular Disease | Primary: Internal Medicine

## 2019-02-19 VITALS — BP 122/86 | HR 66 | Temp 98.1°F | Ht 74.0 in | Wt 137.2 lb

## 2019-02-19 VITALS — BP 125/84 | HR 69 | Temp 98.5°F | Resp 18 | Ht 74.0 in | Wt 136.6 lb

## 2019-02-19 DIAGNOSIS — I4891 Unspecified atrial fibrillation: Secondary | ICD-10-CM

## 2019-02-19 DIAGNOSIS — C3431 Malignant neoplasm of lower lobe, right bronchus or lung: Secondary | ICD-10-CM | POA: Diagnosis not present

## 2019-02-19 DIAGNOSIS — Z87891 Personal history of nicotine dependence: Secondary | ICD-10-CM | POA: Diagnosis not present

## 2019-02-19 DIAGNOSIS — J439 Emphysema, unspecified: Secondary | ICD-10-CM | POA: Diagnosis not present

## 2019-02-19 DIAGNOSIS — C3491 Malignant neoplasm of unspecified part of right bronchus or lung: Secondary | ICD-10-CM | POA: Diagnosis not present

## 2019-02-19 DIAGNOSIS — R942 Abnormal results of pulmonary function studies: Secondary | ICD-10-CM | POA: Diagnosis not present

## 2019-02-19 DIAGNOSIS — R918 Other nonspecific abnormal finding of lung field: Secondary | ICD-10-CM

## 2019-02-19 DIAGNOSIS — Z510 Encounter for antineoplastic radiation therapy: Secondary | ICD-10-CM | POA: Diagnosis not present

## 2019-02-19 DIAGNOSIS — Z5111 Encounter for antineoplastic chemotherapy: Secondary | ICD-10-CM | POA: Diagnosis not present

## 2019-02-19 DIAGNOSIS — Z9889 Other specified postprocedural states: Secondary | ICD-10-CM | POA: Diagnosis not present

## 2019-02-19 LAB — CBC WITH DIFFERENTIAL (CANCER CENTER ONLY)
Abs Immature Granulocytes: 0.02 10*3/uL (ref 0.00–0.07)
Basophils Absolute: 0 10*3/uL (ref 0.0–0.1)
Basophils Relative: 0 %
Eosinophils Absolute: 0 10*3/uL (ref 0.0–0.5)
Eosinophils Relative: 1 %
HCT: 28.5 % — ABNORMAL LOW (ref 39.0–52.0)
Hemoglobin: 9.7 g/dL — ABNORMAL LOW (ref 13.0–17.0)
Immature Granulocytes: 1 %
Lymphocytes Relative: 22 %
Lymphs Abs: 0.6 10*3/uL — ABNORMAL LOW (ref 0.7–4.0)
MCH: 24.4 pg — ABNORMAL LOW (ref 26.0–34.0)
MCHC: 34 g/dL (ref 30.0–36.0)
MCV: 71.8 fL — ABNORMAL LOW (ref 80.0–100.0)
Monocytes Absolute: 0.2 10*3/uL (ref 0.1–1.0)
Monocytes Relative: 8 %
Neutro Abs: 1.9 10*3/uL (ref 1.7–7.7)
Neutrophils Relative %: 68 %
Platelet Count: 107 10*3/uL — ABNORMAL LOW (ref 150–400)
RBC: 3.97 MIL/uL — ABNORMAL LOW (ref 4.22–5.81)
RDW: 14.3 % (ref 11.5–15.5)
WBC Count: 2.8 10*3/uL — ABNORMAL LOW (ref 4.0–10.5)
nRBC: 1.1 % — ABNORMAL HIGH (ref 0.0–0.2)

## 2019-02-19 LAB — CMP (CANCER CENTER ONLY)
ALT: 18 U/L (ref 0–44)
AST: 26 U/L (ref 15–41)
Albumin: 3.9 g/dL (ref 3.5–5.0)
Alkaline Phosphatase: 83 U/L (ref 38–126)
Anion gap: 14 (ref 5–15)
BUN: 6 mg/dL (ref 6–20)
CO2: 24 mmol/L (ref 22–32)
Calcium: 8.9 mg/dL (ref 8.9–10.3)
Chloride: 100 mmol/L (ref 98–111)
Creatinine: 0.69 mg/dL (ref 0.61–1.24)
GFR, Est AFR Am: >60 mL/min (ref >60)
GFR, Estimated: >60 mL/min (ref >60)
Glucose, Bld: 86 mg/dL (ref 70–99)
Potassium: 4 mmol/L (ref 3.5–5.1)
Sodium: 138 mmol/L (ref 135–145)
Total Bilirubin: 0.9 mg/dL (ref 0.3–1.2)
Total Protein: 7.3 g/dL (ref 6.5–8.1)

## 2019-02-19 MED ORDER — SODIUM CHLORIDE 0.9 % IV SOLN
20.0000 mg | Freq: Once | INTRAVENOUS | Status: AC
  Administered 2019-02-19: 20 mg via INTRAVENOUS
  Filled 2019-02-19: qty 20

## 2019-02-19 MED ORDER — SODIUM CHLORIDE 0.9 % IV SOLN
222.2000 mg | Freq: Once | INTRAVENOUS | Status: AC
  Administered 2019-02-19: 16:00:00 220 mg via INTRAVENOUS
  Filled 2019-02-19: qty 22

## 2019-02-19 MED ORDER — SODIUM CHLORIDE 0.9 % IV SOLN
45.0000 mg/m2 | Freq: Once | INTRAVENOUS | Status: AC
  Administered 2019-02-19: 15:00:00 84 mg via INTRAVENOUS
  Filled 2019-02-19: qty 14

## 2019-02-19 MED ORDER — DIPHENHYDRAMINE HCL 50 MG/ML IJ SOLN
50.0000 mg | Freq: Once | INTRAMUSCULAR | Status: AC
  Administered 2019-02-19: 50 mg via INTRAVENOUS

## 2019-02-19 MED ORDER — FAMOTIDINE IN NACL 20-0.9 MG/50ML-% IV SOLN
20.0000 mg | Freq: Once | INTRAVENOUS | Status: AC
  Administered 2019-02-19: 20 mg via INTRAVENOUS

## 2019-02-19 MED ORDER — SUCRALFATE 1 G PO TABS: 1.0000 g | ORAL_TABLET | Freq: Three times a day (TID) | ORAL | 1 refills | Status: DC

## 2019-02-19 MED ORDER — FAMOTIDINE IN NACL 20-0.9 MG/50ML-% IV SOLN
INTRAVENOUS | Status: AC
  Filled 2019-02-19: qty 50

## 2019-02-19 MED ORDER — PALONOSETRON HCL INJECTION 0.25 MG/5ML
INTRAVENOUS | Status: AC
  Filled 2019-02-19: qty 5

## 2019-02-19 MED ORDER — PALONOSETRON HCL INJECTION 0.25 MG/5ML
0.2500 mg | Freq: Once | INTRAVENOUS | Status: AC
  Administered 2019-02-19: 13:00:00 0.25 mg via INTRAVENOUS

## 2019-02-19 MED ORDER — DIPHENHYDRAMINE HCL 50 MG/ML IJ SOLN
INTRAMUSCULAR | Status: AC
  Filled 2019-02-19: qty 1

## 2019-02-19 MED ORDER — SODIUM CHLORIDE 0.9 % IV SOLN
Freq: Once | INTRAVENOUS | Status: AC
  Administered 2019-02-19: 13:00:00 via INTRAVENOUS
  Filled 2019-02-19: qty 250

## 2019-02-19 NOTE — Progress Notes (Signed)
Progress  Notes by Seabron Spatesebe, Matty Deamer E, MD at 02/19/19 0830                Author: Seabron Spatesebe, Bryley Chrisman E, MD  Service: --  Author Type: Physician       Filed: 02/19/19 0926  Encounter Date: 02/19/2019  Status: Signed          Editor: Seabron Spatesebe, Marnee Sherrard E, MD (Physician)                        [facility]   [address], [id number]   [place], [place] [id number]   PHONE: [phone]      NAME: Kenneth SarnaRonald Felter   DOB: 11/28/1960       HPI   Kenneth Schmidt is a 58 y.o.  male seen for a follow up visit regarding    Referral / Consult      He is here of evaluation of pafib .  He was noted to be in afib at [facility] with DR Oda CoganKrishniah.   No chest pain or dyspnea.    He had no symptoms         No sleep apnea symptoms .            Past Medical History, Past Surgical History, Family history, Social History, and Medications were all reviewed with the patient today and updated as necessary.         Outpatient Medications Marked as Taking for the 02/19/19 encounter (Office Visit) with Seabron Spatesebe, Daryll Spisak E, MD          Medication  Sig  Dispense  Refill           ?  metoprolol succinate (TOPROL-XL) 100 mg tablet  TAKE 1 TABLET BY MOUTH EVERY DAY         ?  Ozempic 1 mg/dose (2 mg/1.5 mL) sub-q pen           ?  ergocalciferol (ERGOCALCIFEROL) 1,250 mcg (50,000 unit) capsule  Take  by mouth.         ?  amLODIPine (NORVASC) 10 mg tablet  TAKE 1 TABLET BY MOUTH EVERY DAY         ?  atorvastatin (LIPITOR) 40 mg tablet  TAKE 1 TABLET BY MOUTH EVERY DAY         ?  HYDROcodone-acetaminophen (NORCO) 10-325 mg tablet  TAKE 1 TABLET BY MOUTH EVERY DAY AS NEEDED         ?  NovoLIN 70/30 U-100 Insulin 100 unit/mL (70-30) injection  INJECT 60 TO 70 UNITS UNDER THE SKIN TWICE A DAY         ?  Synjardy XR 12.5-1,000 mg TBph  TAQKE 1 TABLET WITH BREAKFAST TWICE A DAY ORALLY 30 DAY(S)         ?  empagliflozin (JARDIANCE) 10 mg tablet  Take 10 mg by mouth daily.         ?  semaglutide (OZEMPIC) 1 mg/dose (2 mg/1.5 mL) sub-q pen  by SubCUTAneous route.               ?  apixaban  (Eliquis) 5 mg tablet  Take 5 mg by mouth two (2) times a day.              Patient Active Problem List          Diagnosis        ?  Paroxysmal atrial fibrillation (HCC)     ?  Controlled  type 2 diabetes mellitus with kidney complication, with long-term current use of insulin (Kenneth Schmidt)        ?  Stage 3 chronic kidney disease (HCC)        No Known Allergies     Past Medical History:        Diagnosis  Date         ?  Atrial fibrillation ([place])       ?  CAD (coronary artery disease)           ?  Chronic kidney disease            Past Surgical History:         Procedure  Laterality  Date          ?  HX ACL RECONSTRUCTION  Left            ?  HX CORONARY STENT PLACEMENT            No family history on file.      Social History          Tobacco Use         ?  Smoking status:  Current Every Day Smoker     ?  Smokeless tobacco:  Never Used       Substance Use Topics         ?  Alcohol use:  Not Currently                 Review of Systems    Constitutional: Negative for weight loss.         Exercise capacity stable     HENT: Positive for hearing loss.     Eyes: Negative for blurred vision.    Respiratory: Negative for hemoptysis and shortness of breath.     Cardiovascular: Negative for chest pain.    Gastrointestinal: Negative for abdominal pain and blood in stool.    Genitourinary: Negative for hematuria.    Musculoskeletal: Negative for falls.    Neurological: Negative for loss of consciousness.                       Wt Readings from Last 3 Encounters:        02/19/19  224 lb 9.6 oz (101.9 kg)          BP Readings from Last 3 Encounters:        02/19/19  120/68           Visit Vitals      BP  120/68     Pulse  81     Ht  5\' 9"  (1.753 m)     Wt  224 lb 9.6 oz (101.9 kg)        BMI  33.17 kg/m??              Physical Exam    Constitutional: He appears well-developed and well-nourished.    HENT:    Head: Normocephalic.    Neck: No JVD present. Carotid bruit is not present.    Cardiovascular: Normal rate, regular rhythm and normal  heart sounds.    Pulmonary/Chest: Effort normal and breath sounds normal.    Abdominal: Soft.    Musculoskeletal:          General: No edema.   Neurological: He is alert.    Skin: Skin is warm and dry.   Psychiatric: He has a normal mood and affect.  Medical problems and test results were reviewed with the patient today.       No results found for any visits on 02/19/19.         No results found for: HBA1C, HGBE8, HBA1CPOC, HBA1CEXT, HBA1CEXT      No results found for: WBC, WBCLT, HGBPOC, HGB, HGBP, HCTPOC, HCT, PHCT, RBCH, PLT, MCV, HGBEXT, HCTEXT, PLTEXT, HGBEXT, HCTEXT, PLTEXT      No results found for: NA, K, CL, CO2, AGAP, GLU, BUN, CREA, BUCR, GFRAA, GFRNA, CA, GFRAA      No results found for: ALT, GGT, GGTP, AP, APIT, APX, CBIL, TBIL, TBILI      No results found for: CHOL, CHOLPOCT, CHOLX, CHLST, CHOLV, HDL, HDLPOC, HDLP, LDL, LDLCPOC, LDLC, DLDLP, VLDLC, VLDL, TGLX, TRIGL, TRIGP, TGLPOCT, CHHD, CHHDX         ASSESSMENT and PLAN      Diagnoses and all orders for this visit:      1. New onset atrial fibrillation [facility]HCC)   Comments:   asymptomatic at PE with DR Oda CoganKrishniah       Orders:   -     AMB POC EKG ROUTINE W/ 12 LEADS, INTER & REP   -     ECHO COMPLETE STUDY; Future   -     AMB POC HOLTER HOOK/DISCONNECT   -     AMB POC EXTERNAL ECG CONTINUOUS RHYTHM W/I&R UP TO 48 HRS      2. Coronary artery disease involving native coronary artery of native heart without angina pectoris  no angina or heart failure symptoms; continue  current medical management and risk factor modification ; call for new or progressive symptoms       Comments:   remote stenting [facility]   no symptoms       3. Paroxysmal atrial fibrillation (HCC)  will get 72 hour check on occurence.   no change in meds           Thank you very much for your kind referral of this pleasant patient .  Please do not hesitate to call me  [phone] with any  questions or suggestions.                                  Follow-up and Dispositions      ??  Return in  about 4 weeks (around 03/19/2019).                         Seabron SpatesJohn E Jansel Vonstein, MD   02/19/2019   8:50 AM

## 2019-02-19 NOTE — Patient Instructions (Signed)
Thank you for visiting Dr. Valeta Harms at [facility]. Today we recommend the following:  Continued scheduled oncology and radiation follow up. Good luck! Keep walking and maintaining weight!  Return in about 3 months (around 05/22/2019), or if symptoms worsen or fail to improve.    Please do your part to reduce the spread of COVID-19.

## 2019-02-19 NOTE — Progress Notes (Signed)
Synopsis: Referred in September 2019 for navigational bronchoscopy by Brand Males, MD  Subjective:   PATIENT ID: Tyler [name] GENDER: male DOB: Oct 02, 1960, MRN: [id number]  Chief Complaint  Patient presents with  . Follow-up    Needs to discuss MRI results. Reports no new symptoms or concerns. Reports he has cut back in his smoking. Uses Albuterol prn and Spiriva daily.     Initial OV: This is a 58 year old male with a past medical history of influenza A diagnosed in January 2019 admitted to the hospital at the time there was question of post viral Boop and was started on steroids.  He is followed by Dr. Chase Caller in the pulmonary clinic.  Patient has a medical history of alcohol abuse, chronic back pain, tobacco abuse with approximately 40-pack-year history of smoking.  Subsequent follow-up CT imaging initially in January 2019 revealed a left upper lobe 8 mm nodule.  Follow-up imaging revealed low-grade PET uptake.  There was low-grade PET uptake SQ the 2.1 within the right lower lobe medial nodule.  Patient has no complaints today in the office.  He is down to smoking a few cigarettes per week.  He does state he has lost a significant amount of weight of approximately 40 pounds since January.   OV 04/19/2018: The patient electromagnetic navigational bronchoscopy on 04/05/2018.  We sampled the right lower lobe medial nodule as well as the right upper lobe nodule.  All pathologic samples from the bronchoscopy were negative for malignancy.  The right lower lobe needle aspirations were positive for scant granulomatous formation however was nondiagnostic. He is currently down to 1 cigarette per day. Currently breathing is doing ok. He has been using his spiriva. His inhalers are expensive. He is concerned about that. He is otherwise complaint free today from a respiratory standpoint.   OV 06/27/2018: Patient seen today for follow-up regarding his recent CT imaging of his lung nodules.   Reviewing the CT images today in the office with the patient both nodules are stable in comparison to his previous imaging prior to bronchoscopy.  Patient is very relieved.  He has been anxious about this for some time.  He has noticed some changes in his breathing.  Unfortunately he has not been able to quit smoking.  He is currently smoking 1 to 2 cigarettes/day.  Mainly this is just due to habit.  He has been using his albuterol inhaler at nighttime.  Only using the albuterol a few times a week.  He still regularly uses his Spiriva.  He does need refills of this medication today.  OV 02/19/2019: Patient had follow-up nodules or imaging initially reviewed in December 2019 at last office visit.  Six-month imaging completed in June 2020 which revealed enlarging lower lobe nodule.  He now has a right infrahilar lesion approximately 1.9 x 1.8 cm.  A PET scan was completed December 27, 2018 which revealed SUV uptake within the 1.9 cm infrahilar right lower lobe nodule as well as 8 mm short axis subcarinal node concerning for metastatic disease.  Patient has subsequently establish care with Dr. [name] and Dr. Lisbeth Renshaw.  Diagnosed with a stage IIIa (T1b, N2, M0) non-small cell lung cancer, adenocarcinoma.  Decision was made for concurrent chemoradiation with weekly carboplatinum and paclitaxel.  Patient doing well today.  He states that he is trying to get out and walk on the weekends before he has chemo on Mondays.  He has chemo scheduled every Monday.  He has follow-up again today with oncology.  Overall symptoms well managed.  He has noticed some weight loss.  His appetite is down at times.  But he is trying to maintain weight he.  He uses 1-2 ensures per day.  Denies hemoptysis   Past Medical History:  Diagnosis Date  . Alcohol abuse   . Chronic back pain   . COPD (chronic obstructive pulmonary disease) ([place])   . Dyspnea   . Lung mass    mediastinal adenopathy  . Lung nodule   . Multiple pulmonary nodules  04/02/2018  . Pneumonia   . Right lower lobe pulmonary nodule 04/02/2018   12/01/2017: PET SUV 2.1  . Tobacco abuse      Family History  Problem Relation Age of Onset  . Heart attack Maternal Grandfather      Past Surgical History:  Procedure Laterality Date  . APPENDECTOMY    . VIDEO BRONCHOSCOPY WITH ENDOBRONCHIAL NAVIGATION N/A 04/05/2018   Procedure: VIDEO BRONCHOSCOPY WITH ENDOBRONCHIAL NAVIGATION;  Surgeon: Garner Nash, DO;  Location: [place];  Service: Thoracic;  Laterality: N/A;  . VIDEO BRONCHOSCOPY WITH ENDOBRONCHIAL NAVIGATION N/A 01/11/2019   Procedure: VIDEO BRONCHOSCOPY WITH ENDOBRONCHIAL NAVIGATION;  Surgeon: Garner Nash, DO;  Location: [place];  Service: Thoracic;  Laterality: N/A;  . VIDEO BRONCHOSCOPY WITH ENDOBRONCHIAL ULTRASOUND N/A 01/11/2019   Procedure: VIDEO BRONCHOSCOPY WITH ENDOBRONCHIAL ULTRASOUND;  Surgeon: Garner Nash, DO;  Location: [place];  Service: Thoracic;  Laterality: N/A;  . VIDEO BRONCHOSCOPY WITH RADIAL ENDOBRONCHIAL ULTRASOUND N/A 01/11/2019   Procedure: VIDEO BRONCHOSCOPY WITH RADIAL ENDOBRONCHIAL ULTRASOUND;  Surgeon: Garner Nash, DO;  Location: [place];  Service: Thoracic;  Laterality: N/A;  . WISDOM TOOTH EXTRACTION      Social History   Socioeconomic History  . Marital status: Married    Spouse name: Not on file  . Number of children: Not on file  . Years of education: Not on file  . Highest education level: Not on file  Occupational History  . Not on file  Social Needs  . Financial resource strain: Not on file  . Food insecurity    Worry: Not on file    Inability: Not on file  . Transportation needs    Medical: Yes    Non-medical: Yes  Tobacco Use  . Smoking status: Current Every Day Smoker    Packs/day: 0.25    Years: 40.00    Pack years: 10.00    Types: Cigarettes  . Smokeless tobacco: Never Used  Substance and Sexual Activity  . Alcohol use: Not Currently  . Drug use: Not Currently    Types: Marijuana  . Sexual  activity: Not on file  Lifestyle  . Physical activity    Days per week: Not on file    Minutes per session: Not on file  . Stress: Not on file  Relationships  . Social Herbalist on phone: Not on file    Gets together: Not on file    Attends religious service: Not on file    Active member of club or organization: Not on file    Attends meetings of clubs or organizations: Not on file    Relationship status: Not on file  . Intimate partner violence    Fear of current or ex partner: Not on file    Emotionally abused: Not on file    Physically abused: Not on file    Forced sexual activity: Not on file  Other Topics Concern  . Not on file  Social History Narrative  . Not on file     No Known Allergies   Outpatient Medications Prior to Visit  Medication Sig Dispense Refill  . albuterol (PROVENTIL HFA;VENTOLIN HFA) 108 (90 Base) MCG/ACT inhaler Inhale 2 puffs into the lungs every 6 (six) hours as needed for wheezing or shortness of breath. 1 Inhaler 3  . feeding supplement, ENSURE ENLIVE, (ENSURE ENLIVE) LIQD Take 237 mLs by mouth 3 (three) times daily between meals. (Patient taking differently: Take 237 mLs by mouth daily. ) 90 Bottle 0  . naproxen sodium (ALEVE) 220 MG tablet Take 220 mg by mouth daily as needed.    . prochlorperazine (COMPAZINE) 10 MG tablet Take 1 tablet (10 mg total) by mouth every 6 (six) hours as needed for nausea or vomiting. 30 tablet 0  . tiotropium (SPIRIVA HANDIHALER) 18 MCG inhalation capsule Place 1 capsule (18 mcg total) into inhaler and inhale daily. 90 capsule 3   No facility-administered medications prior to visit.     Review of Systems  Constitutional: Positive for weight loss. Negative for chills, fever and malaise/fatigue.  HENT: Negative for hearing loss, sore throat and tinnitus.   Eyes: Negative for blurred vision and double vision.  Respiratory: Positive for cough, sputum production and shortness of breath. Negative for  hemoptysis, wheezing and stridor.   Cardiovascular: Negative for chest pain, palpitations, orthopnea, leg swelling and PND.  Gastrointestinal: Negative for abdominal pain, constipation, diarrhea, heartburn, nausea and vomiting.  Genitourinary: Negative for dysuria, hematuria and urgency.  Musculoskeletal: Negative for joint pain and myalgias.  Skin: Negative for itching and rash.  Neurological: Negative for dizziness, tingling, weakness and headaches.  Endo/Heme/Allergies: Negative for environmental allergies. Does not bruise/bleed easily.  Psychiatric/Behavioral: Negative for depression. The patient is not nervous/anxious and does not have insomnia.   All other systems reviewed and are negative.    Objective:  Physical Exam Vitals signs reviewed.  Constitutional:      General: He is not in acute distress.    Appearance: He is well-developed.  HENT:     Head: Normocephalic and atraumatic.     Mouth/Throat:     Pharynx: No oropharyngeal exudate.  Eyes:     Conjunctiva/sclera: Conjunctivae normal.     Pupils: Pupils are equal, round, and reactive to light.  Neck:     Vascular: No JVD.     Trachea: No tracheal deviation.     Comments: Loss of supraclavicular fat Cardiovascular:     Rate and Rhythm: Normal rate and regular rhythm.     Heart sounds: S1 normal and S2 normal.     Comments: Distant heart tones Pulmonary:     Effort: No tachypnea or accessory muscle usage.     Breath sounds: No stridor. Decreased breath sounds (throughout all lung fields) and wheezing present. No rhonchi or rales.  Abdominal:     General: Bowel sounds are normal. There is no distension.     Palpations: Abdomen is soft.     Tenderness: There is no abdominal tenderness.  Musculoskeletal:        General: Deformity (muscle wasting ) present.  Skin:    General: Skin is warm and dry.     Capillary Refill: Capillary refill takes less than 2 seconds.     Findings: No rash.  Neurological:     Mental  Status: He is alert and oriented to person, place, and time.  Psychiatric:        Behavior: Behavior normal.     Vitals:  02/19/19 0903  BP: 122/86  Pulse: 66  Temp: 98.1 F (36.7 C)  TempSrc: Oral  SpO2: 100%  Weight: 137 lb 3.2 oz (62.2 kg)  Height: 6\' 2"  (1.88 m)   100% on  RA BMI Readings from Last 3 Encounters:  02/19/19 17.62 kg/m  02/05/19 17.50 kg/m  01/24/19 17.28 kg/m   Wt Readings from Last 3 Encounters:  02/19/19 137 lb 3.2 oz (62.2 kg)  02/05/19 136 lb 4.8 oz (61.8 kg)  01/24/19 134 lb 9.6 oz (61.1 kg)     CBC    Component Value Date/Time   WBC 2.9 (L) 02/12/2019 1231   WBC 5.7 01/11/2019 1249   RBC 3.78 (L) 02/12/2019 1231   HGB 9.4 (L) 02/12/2019 1231   HCT 27.4 (L) 02/12/2019 1231   PLT 142 (L) 02/12/2019 1231   MCV 72.5 (L) 02/12/2019 1231   MCH 24.9 (L) 02/12/2019 1231   MCHC 34.3 02/12/2019 1231   RDW 14.2 02/12/2019 1231   LYMPHSABS 0.8 02/12/2019 1231   MONOABS 0.3 02/12/2019 1231   EOSABS 0.0 02/12/2019 1231   BASOSABS 0.0 02/12/2019 1231    Chest Imaging: 03/27/2018 CT super D chest: Multiple bilateral pulmonary nodules.  Most concerning of the nodules including the medial right lower lobe peribronchovascular nodule.  Also small nodule in the right upper lobe.  06/12/2018 CT chest: Stable irregular nodules over the right infrahilar region and medial right lower lobe measuring 1 x 1.2 cm. The patient's images have been independently reviewed by me.    Pulmonary Functions Testing Results: 12/01/2017 full PFTs: Attempted, patient was unable to cooperate with exam.  FeNO: None   Pathology:   01/08/2019: Electromagnetic navigational bronchoscopy, endobronchial ultrasound-guided transbronchial needle aspirations, radial endobronchial ultrasound. FINE NEEDLE ASPIRTION, ENDOSCOPIC (A) EBUS 7 NODE (SPECIMEN 1 OF 4 COLLECTED 01/11/2019) MALIGNANT CELLS PRESENT CONSISTENT WITH NON-SMALL CELL CARCINOMA. EMN: RLL Needle Aspirations   MALIGNANT CELLS PRESENT CONSISTENT WITH NON-SMALL CELL CARCINOMA.  Echocardiogram:  Study Conclusions  - Left ventricle: The cavity size was normal. Systolic function was   normal. The estimated ejection fraction was in the range of 60%   to 65%. Wall motion was normal; there were no regional wall   motion abnormalities. Doppler parameters are consistent with   abnormal left ventricular relaxation (grade 1 diastolic   dysfunction). Doppler parameters are consistent with   indeterminate ventricular filling pressure. - Aortic valve: Transvalvular velocity was within the normal range.   There was no stenosis. There was no regurgitation. - Mitral valve: Transvalvular velocity was within the normal range.   There was no evidence for stenosis. There was no regurgitation. - Right ventricle: The cavity size was normal. Wall thickness was   normal. Systolic function was normal. - Tricuspid valve: There was mild regurgitation. - Pulmonary arteries: Systolic pressure was within the normal   range. PA peak pressure: 26 mm Hg (S).  Heart Catheterization: None    Assessment & Plan:     ICD-10-CM   1. Adenocarcinoma of right lung, stage 3 (HCC)  C34.91   2. Lung mass  R91.8   3. Former smoker  Z87.891   4. Abnormal PET scan of lung  R94.2   5. Pulmonary emphysema, unspecified emphysema type ([place])  J43.9   6. S/P bronchoscopy with biopsy  Z98.890     Discussion:  This is a 58 year old male with stage III lung cancer currently undergoing chemotherapy and radiation treatments.  Overall doing well.  He has quit smoking thankfully.  He  is walking on the weekends prior to his chemo to try to stay active.  He is losing some weight but trying to maintain using nutritional supplements.  Today's follow-up is to make sure he was doing well post bronchoscopy and tolerating his therapies.   Patient to follow-up with [place] in 3 months to ensure symptom management appropriate. Continue current inhaler  regimen with Spiriva.  Albuterol as needed for shortness of breath and wheezing.  Greater than 50% of this patient's 15-minute of visit was spent face-to-face discussing above recommendations and treatment plan.    Current Outpatient Medications:  .  albuterol (PROVENTIL HFA;VENTOLIN HFA) 108 (90 Base) MCG/ACT inhaler, Inhale 2 puffs into the lungs every 6 (six) hours as needed for wheezing or shortness of breath., Disp: 1 Inhaler, Rfl: 3 .  feeding supplement, ENSURE ENLIVE, (ENSURE ENLIVE) LIQD, Take 237 mLs by mouth 3 (three) times daily between meals. (Patient taking differently: Take 237 mLs by mouth daily. ), Disp: 90 Bottle, Rfl: 0 .  naproxen sodium (ALEVE) 220 MG tablet, Take 220 mg by mouth daily as needed., Disp: , Rfl:  .  prochlorperazine (COMPAZINE) 10 MG tablet, Take 1 tablet (10 mg total) by mouth every 6 (six) hours as needed for nausea or vomiting., Disp: 30 tablet, Rfl: 0 .  tiotropium (SPIRIVA HANDIHALER) 18 MCG inhalation capsule, Place 1 capsule (18 mcg total) into inhaler and inhale daily., Disp: 90 capsule, Rfl: 3   Garner Nash, DO [facility] 02/19/2019 9:46 AM

## 2019-02-19 NOTE — Patient Instructions (Signed)
   [facility] Discharge Instructions for Patients Receiving Chemotherapy  Today you received the following chemotherapy agents Taxol and Carboplatin   To help prevent nausea and vomiting after your treatment, we encourage you to take your nausea medication as directed.    If you develop nausea and vomiting that is not controlled by your nausea medication, call the clinic.   BELOW ARE SYMPTOMS THAT SHOULD BE REPORTED IMMEDIATELY:  *FEVER GREATER THAN 100.5 F  *CHILLS WITH OR WITHOUT FEVER  NAUSEA AND VOMITING THAT IS NOT CONTROLLED WITH YOUR NAUSEA MEDICATION  *UNUSUAL SHORTNESS OF BREATH  *UNUSUAL BRUISING OR BLEEDING  TENDERNESS IN MOUTH AND THROAT WITH OR WITHOUT PRESENCE OF ULCERS  *URINARY PROBLEMS  *BOWEL PROBLEMS  UNUSUAL RASH Items with * indicate a potential emergency and should be followed up as soon as possible.  Feel free to call the clinic should you have any questions or concerns. The clinic phone number is [phone].  Please show the CHEMO ALERT CARD at check-in to the Emergency Department and triage nurse.   

## 2019-02-19 NOTE — Progress Notes (Signed)
[facility] OFFICE PROGRESS NOTE  Brand Males, MD [place] [address]  DIAGNOSIS: stage IIIa (T1b, and 2, M0) non-small cell lung cancer, adenocarcinoma diagnosed in July 2020. This could be stage IV disease if the bilateral pulmonary nodules are consistent with malignancy but they are below the detection level for a PET scan. He presented with right lower lobe pulmonary nodule in addition to subcarinal lymphadenopathy.  PRIOR THERAPY: None   CURRENT THERAPY: A course of concurrent chemoradiation with weekly carboplatin for AUC of 2 and paclitaxel 45 mg/M2.  Status post 3 cycles. First dose on 02/04/2019.   INTERVAL HISTORY: Tyler [name] 58 y.o. male returns to the clinic for a follow-up visit.  The patient is feeling well today without any concerning complaints except for some difficulties swallowing secondary to his radiation treatment. He states he feels as though he has burning, particularly with drinking liquids.  His last day radiation is scheduled for March 14, 2019. He has not any weight since his last treatment. His symptoms started last Friday. He has been tolerating his treatment with chemotherapy well without any adverse side effects. He denies any fever, chills, or night sweats. He denies any chest pain, shortness of breath, or hemoptysis. He endorses his baseline cough, which is worse at night when lying flat. He also states that he occasionally gets thick mucus. He denies any nausea, vomiting, diarrhea, or constipation.  He denies any headache or visual changes.  He is here today for evaluation before starting cycle #4.  MEDICAL HISTORY: Past Medical History:  Diagnosis Date  . Alcohol abuse   . Chronic back pain   . COPD (chronic obstructive pulmonary disease) ([place])   . Dyspnea   . Lung mass    mediastinal adenopathy  . Lung nodule   . Multiple pulmonary nodules 04/02/2018  . Pneumonia   . Right lower lobe pulmonary  nodule 04/02/2018   12/01/2017: PET SUV 2.1  . Tobacco abuse     ALLERGIES:  has No Known Allergies.  MEDICATIONS:  Current Outpatient Medications  Medication Sig Dispense Refill  . albuterol (PROVENTIL HFA;VENTOLIN HFA) 108 (90 Base) MCG/ACT inhaler Inhale 2 puffs into the lungs every 6 (six) hours as needed for wheezing or shortness of breath. 1 Inhaler 3  . feeding supplement, ENSURE ENLIVE, (ENSURE ENLIVE) LIQD Take 237 mLs by mouth 3 (three) times daily between meals. (Patient taking differently: Take 237 mLs by mouth daily. ) 90 Bottle 0  . naproxen sodium (ALEVE) 220 MG tablet Take 220 mg by mouth daily as needed.    . prochlorperazine (COMPAZINE) 10 MG tablet Take 1 tablet (10 mg total) by mouth every 6 (six) hours as needed for nausea or vomiting. 30 tablet 0  . tiotropium (SPIRIVA HANDIHALER) 18 MCG inhalation capsule Place 1 capsule (18 mcg total) into inhaler and inhale daily. 90 capsule 3   No current facility-administered medications for this visit.     SURGICAL HISTORY:  Past Surgical History:  Procedure Laterality Date  . APPENDECTOMY    . VIDEO BRONCHOSCOPY WITH ENDOBRONCHIAL NAVIGATION N/A 04/05/2018   Procedure: VIDEO BRONCHOSCOPY WITH ENDOBRONCHIAL NAVIGATION;  Surgeon: Garner Nash, DO;  Location: [facility];  Service: Thoracic;  Laterality: N/A;  . VIDEO BRONCHOSCOPY WITH ENDOBRONCHIAL NAVIGATION N/A 01/11/2019   Procedure: VIDEO BRONCHOSCOPY WITH ENDOBRONCHIAL NAVIGATION;  Surgeon: Garner Nash, DO;  Location: [facility];  Service: Thoracic;  Laterality: N/A;  . VIDEO BRONCHOSCOPY WITH ENDOBRONCHIAL ULTRASOUND N/A  01/11/2019   Procedure: VIDEO BRONCHOSCOPY WITH ENDOBRONCHIAL ULTRASOUND;  Surgeon: Garner Nash, DO;  Location: [facility];  Service: Thoracic;  Laterality: N/A;  . VIDEO BRONCHOSCOPY WITH RADIAL ENDOBRONCHIAL ULTRASOUND N/A 01/11/2019   Procedure: VIDEO BRONCHOSCOPY WITH RADIAL ENDOBRONCHIAL ULTRASOUND;  Surgeon: Garner Nash, DO;  Location: [facility];  Service:  Thoracic;  Laterality: N/A;  . WISDOM TOOTH EXTRACTION      REVIEW OF SYSTEMS:   Review of Systems  Constitutional: Negative for chills, fatigue, fever and unexpected weight change.  HENT: Positive for burning sensation with swallowing. Negative for mouth sores or nosebleeds. Eyes: Negative for eye problems and icterus.  Respiratory: Positive for productive cough. Negative for hemoptysis, shortness of breath and wheezing.   Cardiovascular: Negative for chest pain and leg swelling.  Gastrointestinal: Negative for abdominal pain, constipation, diarrhea, nausea and vomiting.  Genitourinary: Negative for bladder incontinence, difficulty urinating, dysuria, frequency and hematuria.   Musculoskeletal: Negative for back pain, gait problem, neck pain and neck stiffness.  Skin: Negative for itching and rash.  Neurological: Negative for dizziness, extremity weakness, gait problem, headaches, light-headedness and seizures.  Hematological: Negative for adenopathy. Does not bruise/bleed easily.  Psychiatric/Behavioral: Negative for confusion, depression and sleep disturbance. The patient is not nervous/anxious.     PHYSICAL EXAMINATION:  There were no vitals taken for this visit.  ECOG PERFORMANCE STATUS: 1 - Symptomatic but completely ambulatory  Physical Exam  Constitutional: Oriented to person, place, and time and thin-appearing male and in no distress.  HENT:  Head: Normocephalic and atraumatic.  Mouth/Throat: Oropharynx is clear and moist. No oropharyngeal exudate.  Eyes: Conjunctivae are normal. Right eye exhibits no discharge. Left eye exhibits no discharge. No scleral icterus.  Neck: Normal range of motion. Neck supple.  Cardiovascular: Normal rate, regular rhythm, normal heart sounds and intact distal pulses.   Pulmonary/Chest: Occasional wheeze noted. Effort normal and breath sounds normal. No respiratory distress. No rales.  Abdominal: Soft. Bowel sounds are normal. Exhibits no  distension and no mass. There is no tenderness.  Musculoskeletal: Normal range of motion. Exhibits no edema.  Lymphadenopathy:    No cervical adenopathy.  Neurological: Alert and oriented to person, place, and time. Exhibits normal muscle tone. Gait normal. Coordination normal.  Skin: Skin is warm and dry. No rash noted. Not diaphoretic. No erythema. No pallor.  Psychiatric: Mood, memory and judgment normal.  Vitals reviewed.  LABORATORY DATA: Lab Results  Component Value Date   WBC 2.8 (L) 02/19/2019   HGB 9.7 (L) 02/19/2019   HCT 28.5 (L) 02/19/2019   MCV 71.8 (L) 02/19/2019   PLT 107 (L) 02/19/2019      Chemistry      Component Value Date/Time   NA 137 02/12/2019 1231   K 3.9 02/12/2019 1231   CL 100 02/12/2019 1231   CO2 27 02/12/2019 1231   BUN 6 02/12/2019 1231   CREATININE 0.62 02/12/2019 1231      Component Value Date/Time   CALCIUM 8.2 (L) 02/12/2019 1231   ALKPHOS 79 02/12/2019 1231   AST 29 02/12/2019 1231   ALT 23 02/12/2019 1231   BILITOT 0.5 02/12/2019 1231       RADIOGRAPHIC STUDIES:  No results found.   ASSESSMENT/PLAN:  This is a very pleasant 58 year old African-American male recently diagnosed with stage IIIa non-small cell lung cancer, adenocarcinoma.  He presented with a right lower lobe mass mass and subcarinal lymphadenopathy.  This could also be stage IV disease if the bilateral pulmonary nodules are consistent  with malignancy but they are too small and are below the detection level for a PET scan.  He was diagnosed in July 2020.  He is currently undergoing concurrent chemoradiation with carboplatin for an AUC of 2 and paclitaxel 45 mg/m.  He is status post 3 cycles.  He has been tolerating treatment well without any concerning complaints, except he has been having some swallowing difficulties secondary to the radiation treatment.  The patient was seen with Dr. Julien Nordmann today.  Labs were reviewed.  We recommend he proceed with cycle #4 today  scheduled.  We will see him back for follow-up visit in 2 weeks for evaluation before starting cycle #6.  I will send him Carafate to his pharmacy to use before meals and before bedtime for his swallowing difficulties. He also was advised to limit the amount of hot, cold, and spicy foods/drinks as that can exacerbate his symptoms. He also will try using a PPI before bed for his cough and use mucinex for his thick phlegm.   The patient was advised to call immediately if he has any concerning symptoms in the interval. The patient voices understanding of current disease status and treatment options and is in agreement with the current care plan. All questions were answered. The patient knows to call the clinic with any problems, questions or concerns. We can certainly see the patient much sooner if necessary  No orders of the defined types were placed in this encounter.    Tyler Ashby L Keya Wynes, PA-C 02/19/19  ADDENDUM: Hematology/Oncology Attending: I had a face-to-face encounter with the patient today.  I recommended his care plan.  This is a very pleasant 58 years old African-American male with a stage IIIa non-small cell lung cancer, adenocarcinoma and currently undergoing treatment with weekly carboplatin and paclitaxel status post 3 cycles.  He has been tolerating the treatment well except for soreness in his mouth with swallowing but no significant dysphagia. I recommended for the patient to proceed with cycle #4 today as planned. We will start the patient on Carafate before meals and nightly. We will see him back for follow-up visit in 2 weeks for evaluation before cycle #6 of his treatment. He was advised to call immediately if he has any other concerning symptoms in the interval.  Disclaimer: This note was dictated with voice recognition software. Similar sounding words can inadvertently be transcribed and may be missed upon review. Eilleen Kempf, MD 02/19/19

## 2019-02-19 NOTE — Progress Notes (Signed)
[facility]  [address], [address]  [place] [address]  PHONE: [phone]    NAME: Kenneth Schmidt  DOB: [birth year]     HPI  Kenneth Schmidt is a 58 y.o. male seen for a follow up visit regarding   Referral / Consult     He is here of evaluation of pafib .  He was noted to be in afib at PE with Kenneth Schmidt.   No chest pain or dyspnea.   He had no symptoms         No sleep apnea symptoms .         Past Medical History, Past Surgical History, Family history, Social History, and Medications were all reviewed with the patient today and updated as necessary.     Outpatient Medications Marked as Taking for the 02/19/19 encounter (Office Visit) with Kenneth Foy, MD   Medication Sig Dispense Refill   ??? metoprolol succinate (TOPROL-XL) 100 mg tablet TAKE 1 TABLET BY MOUTH EVERY DAY     ??? Ozempic 1 mg/dose (2 mg/1.5 mL) sub-q pen      ??? ergocalciferol (ERGOCALCIFEROL) 1,250 mcg (50,000 unit) capsule Take  by mouth.     ??? amLODIPine (NORVASC) 10 mg tablet TAKE 1 TABLET BY MOUTH EVERY DAY     ??? atorvastatin (LIPITOR) 40 mg tablet TAKE 1 TABLET BY MOUTH EVERY DAY     ??? HYDROcodone-acetaminophen (NORCO) 10-325 mg tablet TAKE 1 TABLET BY MOUTH EVERY DAY AS NEEDED     ??? NovoLIN 70/30 U-100 Insulin 100 unit/mL (70-30) injection INJECT 60 TO 70 UNITS UNDER THE SKIN TWICE A DAY     ??? Synjardy XR 12.5-1,000 mg TBph TAQKE 1 TABLET WITH BREAKFAST TWICE A DAY ORALLY 30 DAY(S)     ??? empagliflozin (JARDIANCE) 10 mg tablet Take 10 mg by mouth daily.     ??? semaglutide (OZEMPIC) 1 mg/dose (2 mg/1.5 mL) sub-q pen by SubCUTAneous route.     ??? apixaban (Eliquis) 5 mg tablet Take 5 mg by mouth two (2) times a day.       Patient Active Problem List    Diagnosis   ??? Paroxysmal atrial fibrillation (HCC)   ??? Controlled type 2 diabetes mellitus with kidney complication, with long-term current use of insulin ([place])   ??? Stage 3 chronic kidney disease (HCC)     No Known Allergies  Past Medical History:   Diagnosis Date    ??? Atrial fibrillation ([place])    ??? CAD (coronary artery disease)    ??? Chronic kidney disease      Past Surgical History:   Procedure Laterality Date   ??? HX ACL RECONSTRUCTION Left    ??? HX CORONARY STENT PLACEMENT       No family history on file.   Social History     Tobacco Use   ??? Smoking status: Current Every Day Smoker   ??? Smokeless tobacco: Never Used   Substance Use Topics   ??? Alcohol use: Not Currently           Review of Systems   Constitutional: Negative for weight loss.        Exercise capacity stable    HENT: Positive for hearing loss.    Eyes: Negative for blurred vision.   Respiratory: Negative for hemoptysis and shortness of breath.    Cardiovascular: Negative for chest pain.   Gastrointestinal: Negative for abdominal pain and blood in stool.   Genitourinary: Negative for hematuria.   Musculoskeletal:  Negative for falls.   Neurological: Negative for loss of consciousness.               Wt Readings from Last 3 Encounters:   02/19/19 224 lb 9.6 oz (101.9 kg)     BP Readings from Last 3 Encounters:   02/19/19 120/68       Visit Vitals  BP 120/68   Pulse 81   Ht 5\' 9"  (1.753 m)   Wt 224 lb 9.6 oz (101.9 kg)   BMI 33.17 kg/m??         Physical Exam   Constitutional: He appears well-developed and well-nourished.   HENT:   Head: Normocephalic.   Neck: No JVD present. Carotid bruit is not present.   Cardiovascular: Normal rate, regular rhythm and normal heart sounds.   Pulmonary/Chest: Effort normal and breath sounds normal.   Abdominal: Soft.   Musculoskeletal:         General: No edema.   Neurological: He is alert.   Skin: Skin is warm and dry.   Psychiatric: He has a normal mood and affect.       Medical problems and test results were reviewed with the patient today.     No results found for any visits on 02/19/19.      No results found for: HBA1C, HGBE8, HBA1CPOC, HBA1CEXT, HBA1CEXT    No results found for: WBC, WBCLT, HGBPOC, HGB, HGBP, HCTPOC, HCT, PHCT,  RBCH, PLT, MCV, HGBEXT, HCTEXT, PLTEXT, HGBEXT, HCTEXT, PLTEXT    No results found for: NA, K, CL, CO2, AGAP, GLU, BUN, CREA, BUCR, GFRAA, GFRNA, CA, GFRAA    No results found for: ALT, GGT, GGTP, AP, APIT, APX, CBIL, TBIL, TBILI    No results found for: CHOL, CHOLPOCT, CHOLX, CHLST, CHOLV, HDL, HDLPOC, HDLP, LDL, LDLCPOC, LDLC, DLDLP, VLDLC, VLDL, TGLX, TRIGL, TRIGP, TGLPOCT, CHHD, CHHDX      ASSESSMENT and PLAN    Diagnoses and all orders for this visit:    1. New onset atrial fibrillation [facility](HCC)  Comments:  asymptomatic at PE with Kenneth Oda Schmidt      Orders:  -     AMB POC EKG ROUTINE W/ 12 LEADS, INTER & REP  -     ECHO COMPLETE STUDY; Future  -     AMB POC HOLTER HOOK/DISCONNECT  -     AMB POC EXTERNAL ECG CONTINUOUS RHYTHM W/I&R UP TO 48 HRS    2. Coronary artery disease involving native coronary artery of native heart without angina pectoris  no angina or heart failure symptoms; continue current medical management and risk factor modification ; call for new or progressive symptoms     Comments:  remote stenting [facility]   no symptoms     3. Paroxysmal atrial fibrillation (HCC)  will get 72 hour check on occurence.   no change in meds        Thank you very much for your kind referral of this pleasant patient .  Please do not hesitate to call me  [phone] with any questions or suggestions.                      Follow-up and Dispositions    ?? Return in about 4 weeks (around 03/19/2019).               Kenneth SpatesJohn E Bryson Palen, MD  02/19/2019  8:50 AM

## 2019-02-20 ENCOUNTER — Other Ambulatory Visit: Payer: Self-pay

## 2019-02-20 ENCOUNTER — Ambulatory Visit
Admission: RE | Admit: 2019-02-20 | Discharge: 2019-02-20 | Disposition: A | Discharge status: Home / Self Care | Payer: Medicaid Other | Source: Ambulatory Visit | Attending: Radiation Oncology | Admitting: Radiation Oncology

## 2019-02-20 DIAGNOSIS — Z510 Encounter for antineoplastic radiation therapy: Secondary | ICD-10-CM | POA: Diagnosis not present

## 2019-02-20 DIAGNOSIS — C3431 Malignant neoplasm of lower lobe, right bronchus or lung: Secondary | ICD-10-CM | POA: Diagnosis not present

## 2019-02-21 ENCOUNTER — Ambulatory Visit
Admission: RE | Admit: 2019-02-21 | Discharge: 2019-02-21 | Disposition: A | Discharge status: Home / Self Care | Payer: Medicaid Other | Source: Ambulatory Visit | Attending: Radiation Oncology | Admitting: Radiation Oncology

## 2019-02-21 ENCOUNTER — Other Ambulatory Visit: Payer: Self-pay

## 2019-02-21 DIAGNOSIS — C3431 Malignant neoplasm of lower lobe, right bronchus or lung: Secondary | ICD-10-CM | POA: Diagnosis not present

## 2019-02-21 DIAGNOSIS — Z510 Encounter for antineoplastic radiation therapy: Secondary | ICD-10-CM | POA: Diagnosis not present

## 2019-02-22 ENCOUNTER — Ambulatory Visit
Admission: RE | Admit: 2019-02-22 | Discharge: 2019-02-22 | Disposition: A | Discharge status: Home / Self Care | Payer: Medicaid Other | Source: Ambulatory Visit | Attending: Radiation Oncology | Admitting: Radiation Oncology

## 2019-02-22 ENCOUNTER — Other Ambulatory Visit: Payer: Self-pay

## 2019-02-22 DIAGNOSIS — Z510 Encounter for antineoplastic radiation therapy: Secondary | ICD-10-CM | POA: Diagnosis not present

## 2019-02-22 DIAGNOSIS — C3431 Malignant neoplasm of lower lobe, right bronchus or lung: Secondary | ICD-10-CM | POA: Diagnosis not present

## 2019-02-23 ENCOUNTER — Other Ambulatory Visit: Payer: Self-pay

## 2019-02-23 ENCOUNTER — Ambulatory Visit
Admission: RE | Admit: 2019-02-23 | Discharge: 2019-02-23 | Disposition: A | Discharge status: Home / Self Care | Payer: Medicaid Other | Source: Ambulatory Visit | Attending: Radiation Oncology | Admitting: Radiation Oncology

## 2019-02-23 DIAGNOSIS — Z510 Encounter for antineoplastic radiation therapy: Secondary | ICD-10-CM | POA: Diagnosis not present

## 2019-02-23 DIAGNOSIS — C3431 Malignant neoplasm of lower lobe, right bronchus or lung: Secondary | ICD-10-CM | POA: Diagnosis not present

## 2019-02-26 ENCOUNTER — Inpatient Hospital Stay: Payer: Medicaid Other

## 2019-02-26 ENCOUNTER — Ambulatory Visit
Admission: RE | Admit: 2019-02-26 | Discharge: 2019-02-26 | Disposition: A | Discharge status: Home / Self Care | Payer: Medicaid Other | Source: Ambulatory Visit | Attending: Radiation Oncology | Admitting: Radiation Oncology

## 2019-02-26 ENCOUNTER — Telehealth: Payer: Self-pay | Admitting: *Deleted

## 2019-02-26 ENCOUNTER — Inpatient Hospital Stay: Payer: Medicaid Other | Admitting: Nutrition

## 2019-02-26 ENCOUNTER — Inpatient Hospital Stay (HOSPITAL_BASED_OUTPATIENT_CLINIC_OR_DEPARTMENT_OTHER): Payer: Medicaid Other | Admitting: Medical

## 2019-02-26 ENCOUNTER — Other Ambulatory Visit: Payer: Self-pay | Admitting: *Deleted

## 2019-02-26 ENCOUNTER — Other Ambulatory Visit: Payer: Self-pay | Admitting: Medical

## 2019-02-26 ENCOUNTER — Other Ambulatory Visit: Payer: Self-pay

## 2019-02-26 VITALS — BP 110/73 | HR 78 | Temp 98.3°F | Resp 16

## 2019-02-26 DIAGNOSIS — Z5111 Encounter for antineoplastic chemotherapy: Secondary | ICD-10-CM | POA: Diagnosis not present

## 2019-02-26 DIAGNOSIS — E876 Hypokalemia: Secondary | ICD-10-CM

## 2019-02-26 DIAGNOSIS — C3491 Malignant neoplasm of unspecified part of right bronchus or lung: Secondary | ICD-10-CM

## 2019-02-26 DIAGNOSIS — R4702 Dysphasia: Secondary | ICD-10-CM

## 2019-02-26 DIAGNOSIS — C3431 Malignant neoplasm of lower lobe, right bronchus or lung: Secondary | ICD-10-CM | POA: Diagnosis not present

## 2019-02-26 DIAGNOSIS — Z510 Encounter for antineoplastic radiation therapy: Secondary | ICD-10-CM | POA: Diagnosis not present

## 2019-02-26 LAB — CBC WITH DIFFERENTIAL (CANCER CENTER ONLY)
Abs Immature Granulocytes: 0.02 10*3/uL (ref 0.00–0.07)
Basophils Absolute: 0 10*3/uL (ref 0.0–0.1)
Basophils Relative: 1 %
Eosinophils Absolute: 0 10*3/uL (ref 0.0–0.5)
Eosinophils Relative: 0 %
HCT: 25.4 % — ABNORMAL LOW (ref 39.0–52.0)
Hemoglobin: 8.7 g/dL — ABNORMAL LOW (ref 13.0–17.0)
Immature Granulocytes: 1 %
Lymphocytes Relative: 17 %
Lymphs Abs: 0.5 10*3/uL — ABNORMAL LOW (ref 0.7–4.0)
MCH: 25.1 pg — ABNORMAL LOW (ref 26.0–34.0)
MCHC: 34.3 g/dL (ref 30.0–36.0)
MCV: 73.2 fL — ABNORMAL LOW (ref 80.0–100.0)
Monocytes Absolute: 0.4 10*3/uL (ref 0.1–1.0)
Monocytes Relative: 15 %
Neutro Abs: 1.8 10*3/uL (ref 1.7–7.7)
Neutrophils Relative %: 66 %
Platelet Count: 100 10*3/uL — ABNORMAL LOW (ref 150–400)
RBC: 3.47 MIL/uL — ABNORMAL LOW (ref 4.22–5.81)
RDW: 14.5 % (ref 11.5–15.5)
WBC Count: 2.7 10*3/uL — ABNORMAL LOW (ref 4.0–10.5)
nRBC: 1.9 % — ABNORMAL HIGH (ref 0.0–0.2)

## 2019-02-26 LAB — CMP (CANCER CENTER ONLY)
ALT: 14 U/L (ref 0–44)
AST: 17 U/L (ref 15–41)
Albumin: 3.4 g/dL — ABNORMAL LOW (ref 3.5–5.0)
Alkaline Phosphatase: 56 U/L (ref 38–126)
Anion gap: 15 (ref 5–15)
BUN: 19 mg/dL (ref 6–20)
CO2: 24 mmol/L (ref 22–32)
Calcium: 8.7 mg/dL — ABNORMAL LOW (ref 8.9–10.3)
Chloride: 95 mmol/L — ABNORMAL LOW (ref 98–111)
Creatinine: 0.79 mg/dL (ref 0.61–1.24)
GFR, Est AFR Am: >60 mL/min (ref >60)
GFR, Estimated: >60 mL/min (ref >60)
Glucose, Bld: 111 mg/dL — ABNORMAL HIGH (ref 70–99)
Potassium: 2.8 mmol/L — CL (ref 3.5–5.1)
Sodium: 134 mmol/L — ABNORMAL LOW (ref 135–145)
Total Bilirubin: 1.6 mg/dL — ABNORMAL HIGH (ref 0.3–1.2)
Total Protein: 7.1 g/dL (ref 6.5–8.1)

## 2019-02-26 MED ORDER — SODIUM CHLORIDE 0.9 % IV SOLN
20.0000 mg | Freq: Once | INTRAVENOUS | Status: AC
  Administered 2019-02-26: 20 mg via INTRAVENOUS
  Filled 2019-02-26: qty 2

## 2019-02-26 MED ORDER — POTASSIUM CHLORIDE CRYS ER 20 MEQ PO TBCR: 20.0000 meq | EXTENDED_RELEASE_TABLET | Freq: Two times a day (BID) | ORAL | 0 refills | Status: DC

## 2019-02-26 MED ORDER — SODIUM CHLORIDE 0.9 % IV SOLN
222.2000 mg | Freq: Once | INTRAVENOUS | Status: AC
  Administered 2019-02-26: 17:00:00 220 mg via INTRAVENOUS
  Filled 2019-02-26: qty 22

## 2019-02-26 MED ORDER — SODIUM CHLORIDE 0.9 % IV SOLN
45.0000 mg/m2 | Freq: Once | INTRAVENOUS | Status: AC
  Administered 2019-02-26: 84 mg via INTRAVENOUS
  Filled 2019-02-26: qty 14

## 2019-02-26 MED ORDER — SODIUM CHLORIDE 0.9 % IV SOLN
Freq: Once | INTRAVENOUS | Status: AC
  Administered 2019-02-26: 14:00:00 via INTRAVENOUS
  Filled 2019-02-26: qty 250

## 2019-02-26 MED ORDER — FAMOTIDINE IN NACL 20-0.9 MG/50ML-% IV SOLN
20.0000 mg | Freq: Once | INTRAVENOUS | Status: AC
  Administered 2019-02-26: 20 mg via INTRAVENOUS

## 2019-02-26 MED ORDER — SODIUM CHLORIDE 0.9 % IV SOLN
INTRAVENOUS | Status: DC
  Administered 2019-02-26: 15:00:00 via INTRAVENOUS
  Filled 2019-02-26: qty 250

## 2019-02-26 MED ORDER — POTASSIUM CHLORIDE 10 MEQ/100ML IV SOLN
10.0000 meq | INTRAVENOUS | Status: AC
  Administered 2019-02-26 (×2): 10 meq via INTRAVENOUS
  Filled 2019-02-26: qty 100

## 2019-02-26 MED ORDER — DIPHENHYDRAMINE HCL 50 MG/ML IJ SOLN
50.0000 mg | Freq: Once | INTRAMUSCULAR | Status: AC
  Administered 2019-02-26: 50 mg via INTRAVENOUS

## 2019-02-26 MED ORDER — LIDOCAINE VISCOUS HCL 2 % MT SOLN: OROMUCOSAL | 0 refills | Status: DC

## 2019-02-26 MED ORDER — PALONOSETRON HCL INJECTION 0.25 MG/5ML
0.2500 mg | Freq: Once | INTRAVENOUS | Status: AC
  Administered 2019-02-26: 0.25 mg via INTRAVENOUS

## 2019-02-26 MED ORDER — FAMOTIDINE IN NACL 20-0.9 MG/50ML-% IV SOLN
INTRAVENOUS | Status: AC
  Filled 2019-02-26: qty 50

## 2019-02-26 MED ORDER — POTASSIUM CHLORIDE 10 MEQ/100ML IV SOLN
10.0000 meq | INTRAVENOUS | Status: DC
  Filled 2019-02-26: qty 100

## 2019-02-26 MED ORDER — DIPHENHYDRAMINE HCL 50 MG/ML IJ SOLN
INTRAMUSCULAR | Status: AC
  Filled 2019-02-26: qty 1

## 2019-02-26 MED ORDER — PALONOSETRON HCL INJECTION 0.25 MG/5ML
INTRAVENOUS | Status: AC
  Filled 2019-02-26: qty 5

## 2019-02-26 NOTE — Telephone Encounter (Signed)
Received call from lab with K+ of 2.8  Dr. Julien Nordmann made aware.  Orders placed for IV K+ today in infusion and oral K+ prescription sent to his pharmacy. Infusion nurse to let pt know of the above.

## 2019-02-26 NOTE — Patient Instructions (Signed)
   [facility] Discharge Instructions for Patients Receiving Chemotherapy  Today you received the following chemotherapy agents Taxol and Carboplatin   To help prevent nausea and vomiting after your treatment, we encourage you to take your nausea medication as directed.    If you develop nausea and vomiting that is not controlled by your nausea medication, call the clinic.   BELOW ARE SYMPTOMS THAT SHOULD BE REPORTED IMMEDIATELY:  *FEVER GREATER THAN 100.5 F  *CHILLS WITH OR WITHOUT FEVER  NAUSEA AND VOMITING THAT IS NOT CONTROLLED WITH YOUR NAUSEA MEDICATION  *UNUSUAL SHORTNESS OF BREATH  *UNUSUAL BRUISING OR BLEEDING  TENDERNESS IN MOUTH AND THROAT WITH OR WITHOUT PRESENCE OF ULCERS  *URINARY PROBLEMS  *BOWEL PROBLEMS  UNUSUAL RASH Items with * indicate a potential emergency and should be followed up as soon as possible.  Feel free to call the clinic should you have any questions or concerns. The clinic phone number is [phone].  Please show the CHEMO ALERT CARD at check-in to the Emergency Department and triage nurse.   

## 2019-02-26 NOTE — Progress Notes (Signed)
Per MD, ok for tx w/ Tbili = 1.6; Pltc - [place], Pharm.D., CPP 02/26/2019@2 :55 PM

## 2019-02-27 ENCOUNTER — Encounter

## 2019-02-27 ENCOUNTER — Other Ambulatory Visit: Payer: Self-pay

## 2019-02-27 ENCOUNTER — Ambulatory Visit
Admission: RE | Admit: 2019-02-27 | Discharge: 2019-02-27 | Disposition: A | Discharge status: Home / Self Care | Payer: Medicaid Other | Source: Ambulatory Visit | Attending: Radiation Oncology | Admitting: Radiation Oncology

## 2019-02-27 ENCOUNTER — Institutional Professional Consult (permissible substitution): Admit: 2019-02-27 | Discharge: 2019-02-27 | Payer: BLUE CROSS/BLUE SHIELD | Primary: Internal Medicine

## 2019-02-27 DIAGNOSIS — I4891 Unspecified atrial fibrillation: Secondary | ICD-10-CM

## 2019-02-27 DIAGNOSIS — Z510 Encounter for antineoplastic radiation therapy: Secondary | ICD-10-CM | POA: Diagnosis not present

## 2019-02-27 DIAGNOSIS — C3431 Malignant neoplasm of lower lobe, right bronchus or lung: Secondary | ICD-10-CM | POA: Diagnosis not present

## 2019-02-27 NOTE — Progress Notes (Signed)
Appointment scheduled september

## 2019-02-27 NOTE — Progress Notes (Signed)
Echo completed.  See interpretation scanned to the order.

## 2019-02-27 NOTE — Progress Notes (Signed)
The patient is receiving radiation therapy and reports he is having dysphasia.  He has Carafate at home which she is not been using.  He was instructed to begin using Carafate as instructed.  He additionally was given a prescription for viscous lidocaine with instructions that he can swallow 5 mL's before meals.  He was told that he could mix this with Carafate when he dissolves it and water.  He expresses understanding and agreement with this plan.  Sandi Mealy, MHS, PA-C Physician Assistant

## 2019-02-27 NOTE — Progress Notes (Signed)
Appointment scheduled september

## 2019-02-28 ENCOUNTER — Other Ambulatory Visit: Payer: Self-pay

## 2019-02-28 ENCOUNTER — Ambulatory Visit
Admission: RE | Admit: 2019-02-28 | Discharge: 2019-02-28 | Disposition: A | Discharge status: Home / Self Care | Payer: Medicaid Other | Source: Ambulatory Visit | Attending: Radiation Oncology | Admitting: Radiation Oncology

## 2019-02-28 DIAGNOSIS — Z510 Encounter for antineoplastic radiation therapy: Secondary | ICD-10-CM | POA: Diagnosis not present

## 2019-02-28 DIAGNOSIS — C3431 Malignant neoplasm of lower lobe, right bronchus or lung: Secondary | ICD-10-CM | POA: Diagnosis not present

## 2019-03-01 ENCOUNTER — Ambulatory Visit
Admission: RE | Admit: 2019-03-01 | Discharge: 2019-03-01 | Disposition: A | Discharge status: Home / Self Care | Payer: Medicaid Other | Source: Ambulatory Visit | Attending: Radiation Oncology | Admitting: Radiation Oncology

## 2019-03-01 ENCOUNTER — Other Ambulatory Visit: Payer: Self-pay

## 2019-03-01 DIAGNOSIS — Z510 Encounter for antineoplastic radiation therapy: Secondary | ICD-10-CM | POA: Diagnosis not present

## 2019-03-01 DIAGNOSIS — C3431 Malignant neoplasm of lower lobe, right bronchus or lung: Secondary | ICD-10-CM | POA: Insufficient documentation

## 2019-03-01 NOTE — Progress Notes (Signed)
  Radiation Oncology         [phone] ________________________________  Name: Tyler [name] MRN: [id number]  Date: 01/25/2019  DOB: [birth year]  SIMULATION AND TREATMENT PLANNING NOTE  DIAGNOSIS:     ICD-10-CM   1. Adenocarcinoma of right lung, stage 3 (HCC)  C34.91   2. Malignant neoplasm of bronchus of right lower lobe [facility])  C34.31      Site:  chest  NARRATIVE:  The patient was brought to the [facility].  Identity was confirmed.  All relevant records and images related to the planned course of therapy were reviewed.   Written consent to proceed with treatment was confirmed which was freely given after reviewing the details related to the planned course of therapy had been reviewed with the patient.  Then, the patient was set-up in a stable reproducible  supine position for radiation therapy.  CT images were obtained.  Surface markings were placed.    Medically necessary complex treatment device(s) for immobilization:  Vac-lock bag.   The CT images were loaded into the planning software.  Then the target and avoidance structures were contoured.  Treatment planning then occurred.  The radiation prescription was entered and confirmed.  A total of 5 complex treatment devices were fabricated which relate to the designed radiation treatment fields. Additional reduced fields will be used as necessary to improve the dose homogeneity of the plan. Each of these customized fields/ complex treatment devices will be used on a daily basis during the radiation course. I have requested : 3D Simulation  I have requested a DVH of the following structures: target volume, spinal cord, lungs, heart.   The patient will undergo daily image guidance to ensure accurate localization of the target, and adequate minimize dose to the normal surrounding structures in close proximity to the target.  PLAN:  The patient will receive 60 Gy in 30 fractions initially. The patient will then receive a 6  Gy boost for a final dose of 66 Gy.   Special treatment procedure The patient will also receive concurrent chemotherapy during the treatment. The patient may therefore experience increased toxicity or side effects and the patient will be monitored for such problems. This may require extra lab work as necessary. This therefore constitutes a special treatment procedure.   ________________________________   Jodelle Gross, MD, PhD

## 2019-03-01 NOTE — Progress Notes (Signed)
  Radiation Oncology         [phone] ________________________________  Name: Tyler [name] MRN: [id number]  Date: 01/25/2019  DOB: 1961-04-20  RESPIRATORY MOTION MANAGEMENT SIMULATION  NARRATIVE:  In order to account for effect of respiratory motion on target structures and other organs in the planning and delivery of radiotherapy, this patient underwent respiratory motion management simulation.  To accomplish this, when the patient was brought to the CT simulation planning suite, 4D respiratoy motion management CT images were obtained.  The CT images were loaded into the planning software.  Then, using a variety of tools including Cine, MIP, and standard views, the target volume and planning target volumes (PTV) were delineated.  Avoidance structures were contoured.  Treatment planning then occurred.  Dose volume histograms were generated and reviewed for each of the requested structure.  The resulting plan was carefully reviewed and approved today.   ------------------------------------------------  Jodelle Gross, MD, PhD

## 2019-03-02 ENCOUNTER — Ambulatory Visit
Admission: RE | Admit: 2019-03-02 | Discharge: 2019-03-02 | Disposition: A | Discharge status: Home / Self Care | Payer: Medicaid Other | Source: Ambulatory Visit | Attending: Radiation Oncology | Admitting: Radiation Oncology

## 2019-03-02 ENCOUNTER — Other Ambulatory Visit: Payer: Self-pay

## 2019-03-02 DIAGNOSIS — Z510 Encounter for antineoplastic radiation therapy: Secondary | ICD-10-CM | POA: Diagnosis not present

## 2019-03-02 DIAGNOSIS — C3431 Malignant neoplasm of lower lobe, right bronchus or lung: Secondary | ICD-10-CM | POA: Diagnosis not present

## 2019-03-05 ENCOUNTER — Inpatient Hospital Stay: Payer: Medicaid Other

## 2019-03-05 ENCOUNTER — Inpatient Hospital Stay (HOSPITAL_BASED_OUTPATIENT_CLINIC_OR_DEPARTMENT_OTHER): Payer: Medicaid Other | Admitting: Internal Medicine

## 2019-03-05 ENCOUNTER — Encounter: Payer: Self-pay | Admitting: Internal Medicine

## 2019-03-05 ENCOUNTER — Other Ambulatory Visit: Payer: Self-pay | Admitting: Medical Oncology

## 2019-03-05 ENCOUNTER — Other Ambulatory Visit: Payer: Self-pay

## 2019-03-05 ENCOUNTER — Ambulatory Visit
Admission: RE | Admit: 2019-03-05 | Discharge: 2019-03-05 | Disposition: A | Discharge status: Home / Self Care | Payer: Medicaid Other | Source: Ambulatory Visit | Attending: Radiation Oncology | Admitting: Radiation Oncology

## 2019-03-05 ENCOUNTER — Inpatient Hospital Stay: Payer: Medicaid Other | Admitting: Nutrition

## 2019-03-05 VITALS — BP 113/74 | HR 66 | Temp 98.2°F | Resp 18 | Ht 74.0 in | Wt 131.0 lb

## 2019-03-05 VITALS — BP 113/71 | HR 88 | Temp 98.8°F | Resp 16

## 2019-03-05 DIAGNOSIS — Z5111 Encounter for antineoplastic chemotherapy: Secondary | ICD-10-CM | POA: Diagnosis not present

## 2019-03-05 DIAGNOSIS — E876 Hypokalemia: Secondary | ICD-10-CM

## 2019-03-05 DIAGNOSIS — C3491 Malignant neoplasm of unspecified part of right bronchus or lung: Secondary | ICD-10-CM

## 2019-03-05 DIAGNOSIS — D649 Anemia, unspecified: Secondary | ICD-10-CM

## 2019-03-05 DIAGNOSIS — C3490 Malignant neoplasm of unspecified part of unspecified bronchus or lung: Secondary | ICD-10-CM | POA: Diagnosis not present

## 2019-03-05 DIAGNOSIS — C3431 Malignant neoplasm of lower lobe, right bronchus or lung: Secondary | ICD-10-CM | POA: Diagnosis not present

## 2019-03-05 DIAGNOSIS — Z510 Encounter for antineoplastic radiation therapy: Secondary | ICD-10-CM | POA: Diagnosis not present

## 2019-03-05 LAB — PREPARE RBC (CROSSMATCH)

## 2019-03-05 LAB — CBC WITH DIFFERENTIAL (CANCER CENTER ONLY)
Abs Immature Granulocytes: 0.05 10*3/uL (ref 0.00–0.07)
Basophils Absolute: 0 10*3/uL (ref 0.0–0.1)
Basophils Relative: 1 %
Eosinophils Absolute: 0 10*3/uL (ref 0.0–0.5)
Eosinophils Relative: 0 %
HCT: 22.1 % — ABNORMAL LOW (ref 39.0–52.0)
Hemoglobin: 7.6 g/dL — ABNORMAL LOW (ref 13.0–17.0)
Immature Granulocytes: 2 %
Lymphocytes Relative: 15 %
Lymphs Abs: 0.4 10*3/uL — ABNORMAL LOW (ref 0.7–4.0)
MCH: 24.8 pg — ABNORMAL LOW (ref 26.0–34.0)
MCHC: 34.4 g/dL (ref 30.0–36.0)
MCV: 72 fL — ABNORMAL LOW (ref 80.0–100.0)
Monocytes Absolute: 0.3 10*3/uL (ref 0.1–1.0)
Monocytes Relative: 11 %
Neutro Abs: 1.9 10*3/uL (ref 1.7–7.7)
Neutrophils Relative %: 71 %
Platelet Count: 91 10*3/uL — ABNORMAL LOW (ref 150–400)
RBC: 3.07 MIL/uL — ABNORMAL LOW (ref 4.22–5.81)
RDW: 14.8 % (ref 11.5–15.5)
WBC Count: 2.6 10*3/uL — ABNORMAL LOW (ref 4.0–10.5)
nRBC: 2.7 % — ABNORMAL HIGH (ref 0.0–0.2)

## 2019-03-05 LAB — CMP (CANCER CENTER ONLY)
ALT: 15 U/L (ref 0–44)
AST: 20 U/L (ref 15–41)
Albumin: 3.3 g/dL — ABNORMAL LOW (ref 3.5–5.0)
Alkaline Phosphatase: 61 U/L (ref 38–126)
Anion gap: 13 (ref 5–15)
BUN: 10 mg/dL (ref 6–20)
CO2: 29 mmol/L (ref 22–32)
Calcium: 8.3 mg/dL — ABNORMAL LOW (ref 8.9–10.3)
Chloride: 93 mmol/L — ABNORMAL LOW (ref 98–111)
Creatinine: 0.68 mg/dL (ref 0.61–1.24)
GFR, Est AFR Am: >60 mL/min (ref >60)
GFR, Estimated: >60 mL/min (ref >60)
Glucose, Bld: 91 mg/dL (ref 70–99)
Potassium: 2.9 mmol/L — CL (ref 3.5–5.1)
Sodium: 135 mmol/L (ref 135–145)
Total Bilirubin: 1.4 mg/dL — ABNORMAL HIGH (ref 0.3–1.2)
Total Protein: 6.6 g/dL (ref 6.5–8.1)

## 2019-03-05 LAB — SAMPLE TO BLOOD BANK

## 2019-03-05 MED ORDER — ACETAMINOPHEN 325 MG PO TABS
ORAL_TABLET | ORAL | Status: AC
  Filled 2019-03-05: qty 2

## 2019-03-05 MED ORDER — SODIUM CHLORIDE 0.9% IV SOLUTION
250.0000 mL | Freq: Once | INTRAVENOUS | Status: AC
  Administered 2019-03-05: 250 mL via INTRAVENOUS
  Filled 2019-03-05: qty 250

## 2019-03-05 MED ORDER — SODIUM CHLORIDE 0.9 % IV SOLN
45.0000 mg/m2 | Freq: Once | INTRAVENOUS | Status: AC
  Administered 2019-03-05: 84 mg via INTRAVENOUS
  Filled 2019-03-05: qty 14

## 2019-03-05 MED ORDER — SODIUM CHLORIDE 0.9 % IV SOLN
222.2000 mg | Freq: Once | INTRAVENOUS | Status: AC
  Administered 2019-03-05: 220 mg via INTRAVENOUS
  Filled 2019-03-05: qty 22

## 2019-03-05 MED ORDER — SODIUM CHLORIDE 0.9 % IV SOLN
Freq: Once | INTRAVENOUS | Status: AC
  Administered 2019-03-05: 13:00:00 via INTRAVENOUS
  Filled 2019-03-05: qty 250

## 2019-03-05 MED ORDER — DIPHENHYDRAMINE HCL 50 MG/ML IJ SOLN
INTRAMUSCULAR | Status: AC
  Filled 2019-03-05: qty 1

## 2019-03-05 MED ORDER — ACETAMINOPHEN 325 MG PO TABS
650.0000 mg | ORAL_TABLET | Freq: Once | ORAL | Status: AC
  Administered 2019-03-05: 650 mg via ORAL

## 2019-03-05 MED ORDER — DIPHENHYDRAMINE HCL 25 MG PO CAPS: 25.0000 mg | ORAL_CAPSULE | Freq: Once | ORAL | Status: DC

## 2019-03-05 MED ORDER — SODIUM CHLORIDE 0.9 % IV SOLN
20.0000 mg | Freq: Once | INTRAVENOUS | Status: AC
  Administered 2019-03-05: 20 mg via INTRAVENOUS
  Filled 2019-03-05: qty 20

## 2019-03-05 MED ORDER — PALONOSETRON HCL INJECTION 0.25 MG/5ML
INTRAVENOUS | Status: AC
  Filled 2019-03-05: qty 5

## 2019-03-05 MED ORDER — FAMOTIDINE IN NACL 20-0.9 MG/50ML-% IV SOLN
20.0000 mg | Freq: Once | INTRAVENOUS | Status: AC
  Administered 2019-03-05: 20 mg via INTRAVENOUS

## 2019-03-05 MED ORDER — FAMOTIDINE IN NACL 20-0.9 MG/50ML-% IV SOLN
INTRAVENOUS | Status: AC
  Filled 2019-03-05: qty 50

## 2019-03-05 MED ORDER — POTASSIUM CHLORIDE CRYS ER 20 MEQ PO TBCR: 20.0000 meq | EXTENDED_RELEASE_TABLET | Freq: Two times a day (BID) | ORAL | 0 refills | Status: DC

## 2019-03-05 MED ORDER — DIPHENHYDRAMINE HCL 50 MG/ML IJ SOLN
50.0000 mg | Freq: Once | INTRAMUSCULAR | Status: AC
  Administered 2019-03-05: 50 mg via INTRAVENOUS

## 2019-03-05 MED ORDER — PALONOSETRON HCL INJECTION 0.25 MG/5ML
0.2500 mg | Freq: Once | INTRAVENOUS | Status: AC
  Administered 2019-03-05: 0.25 mg via INTRAVENOUS

## 2019-03-05 NOTE — Patient Instructions (Signed)
Steps to Quit Smoking Smoking tobacco is the leading cause of preventable death. It can affect almost every organ in the body. Smoking puts you and people around you at risk for many serious, long-lasting (chronic) diseases. Quitting smoking can be hard, but it is one of the best things that you can do for your health. It is never too late to quit. How do I get ready to quit? When you decide to quit smoking, make a plan to help you succeed. Before you quit:  Pick a date to quit. Set a date within the next 2 weeks to give you time to prepare.  Write down the reasons why you are quitting. Keep this list in places where you will see it often.  Tell your family, friends, and co-workers that you are quitting. Their support is important.  Talk with your doctor about the choices that may help you quit.  Find out if your health insurance will pay for these treatments.  Know the people, places, things, and activities that make you want to smoke (triggers). Avoid them. What first steps can I take to quit smoking?  Throw away all cigarettes at home, at work, and in your car.  Throw away the things that you use when you smoke, such as ashtrays and lighters.  Clean your car. Make sure to empty the ashtray.  Clean your home, including curtains and carpets. What can I do to help me quit smoking? Talk with your doctor about taking medicines and seeing a counselor at the same time. You are more likely to succeed when you do both.  If you are pregnant or breastfeeding, talk with your doctor about counseling or other ways to quit smoking. Do not take medicine to help you quit smoking unless your doctor tells you to do so. To quit smoking: Quit right away  Quit smoking totally, instead of slowly cutting back on how much you smoke over a period of time.  Go to counseling. You are more likely to quit if you go to counseling sessions regularly. Take medicine You may take medicines to help you quit. Some  medicines need a prescription, and some you can buy over-the-counter. Some medicines may contain a drug called nicotine to replace the nicotine in cigarettes. Medicines may:  Help you to stop having the desire to smoke (cravings).  Help to stop the problems that come when you stop smoking (withdrawal symptoms). Your doctor may ask you to use:  Nicotine patches, gum, or lozenges.  Nicotine inhalers or sprays.  Non-nicotine medicine that is taken by mouth. Find resources Find resources and other ways to help you quit smoking and remain smoke-free after you quit. These resources are most helpful when you use them often. They include:  Online chats with a counselor.  Phone quitlines.  Printed self-help materials.  Support groups or group counseling.  Text messaging programs.  Mobile phone apps. Use apps on your mobile phone or tablet that can help you stick to your quit plan. There are many free apps for mobile phones and tablets as well as websites. Examples include Quit Guide from the CDC and smokefree.gov  What things can I do to make it easier to quit?   Talk to your family and friends. Ask them to support and encourage you.  Call a phone quitline (1-800-QUIT-NOW), reach out to support groups, or work with a counselor.  Ask people who smoke to not smoke around you.  Avoid places that make you want to smoke,   such as: ? Bars. ? Parties. ? Smoke-break areas at work.  Spend time with people who do not smoke.  Lower the stress in your life. Stress can make you want to smoke. Try these things to help your stress: ? Getting regular exercise. ? Doing deep-breathing exercises. ? Doing yoga. ? Meditating. ? Doing a body scan. To do this, close your eyes, focus on one area of your body at a time from head to toe. Notice which parts of your body are tense. Try to relax the muscles in those areas. How will I feel when I quit smoking? Day 1 to 3 weeks Within the first 24 hours,  you may start to have some problems that come from quitting tobacco. These problems are very bad 2-3 days after you quit, but they do not often last for more than 2-3 weeks. You may get these symptoms:  Mood swings.  Feeling restless, nervous, angry, or annoyed.  Trouble concentrating.  Dizziness.  Strong desire for high-sugar foods and nicotine.  Weight gain.  Trouble pooping (constipation).  Feeling like you may vomit (nausea).  Coughing or a sore throat.  Changes in how the medicines that you take for other issues work in your body.  Depression.  Trouble sleeping (insomnia). Week 3 and afterward After the first 2-3 weeks of quitting, you may start to notice more positive results, such as:  Better sense of smell and taste.  Less coughing and sore throat.  Slower heart rate.  Lower blood pressure.  Clearer skin.  Better breathing.  Fewer sick days. Quitting smoking can be hard. Do not give up if you fail the first time. Some people need to try a few times before they succeed. Do your best to stick to your quit plan, and talk with your doctor if you have any questions or concerns. Summary  Smoking tobacco is the leading cause of preventable death. Quitting smoking can be hard, but it is one of the best things that you can do for your health.  When you decide to quit smoking, make a plan to help you succeed.  Quit smoking right away, not slowly over a period of time.  When you start quitting, seek help from your doctor, family, or friends. This information is not intended to replace advice given to you by your health care provider. Make sure you discuss any questions you have with your health care provider. Document Released: 04/24/2009 Document Revised: 09/15/2018 Document Reviewed: 09/16/2018 Elsevier Patient Education  2020 Elsevier Inc.  

## 2019-03-05 NOTE — Progress Notes (Signed)
[facility] Telephone:([phone]) [phone]   Fax:(336) [phone]  OFFICE PROGRESS NOTE  Brand Males, MD [place] [address]  DIAGNOSIS: stage IIIa (T1b, and 2, M0) non-small cell lung cancer, adenocarcinoma diagnosed in July 2020.  This could be stage IV disease if the bilateral pulmonary nodules are consistent with malignancy but they are below the detection level for a PET scan.  He presented with right lower lobe pulmonary nodule in addition to subcarinal lymphadenopathy.  PRIOR THERAPY: None.  CURRENT THERAPY: A course of concurrent chemoradiation with weekly carboplatin for AUC of 2 and paclitaxel 45 mg/M2.  Status post 5 cycles.  INTERVAL HISTORY: Tyler [name] 58 y.o. male returns to the clinic today for follow-up visit.  The patient is feeling fine today with no concerning complaints except for mild odynophagia and fatigue.  He denied having any current chest pain, shortness of breath, cough or hemoptysis.  He denied having any fever or chills.  He has no nausea, vomiting, diarrhea or constipation.  He has no headache or visual changes.  He is here today for evaluation before starting cycle #6 of his treatment.  MEDICAL HISTORY: Past Medical History:  Diagnosis Date  . Alcohol abuse   . Chronic back pain   . COPD (chronic obstructive pulmonary disease) ([facility])   . Dyspnea   . Lung mass    mediastinal adenopathy  . Lung nodule   . Multiple pulmonary nodules 04/02/2018  . Pneumonia   . Right lower lobe pulmonary nodule 04/02/2018   12/01/2017: PET SUV 2.1  . Tobacco abuse     ALLERGIES:  has No Known Allergies.  MEDICATIONS:  Current Outpatient Medications  Medication Sig Dispense Refill  . albuterol (PROVENTIL HFA;VENTOLIN HFA) 108 (90 Base) MCG/ACT inhaler Inhale 2 puffs into the lungs every 6 (six) hours as needed for wheezing or shortness of breath. 1 Inhaler 3  . feeding supplement, ENSURE ENLIVE, (ENSURE ENLIVE) LIQD Take  237 mLs by mouth 3 (three) times daily between meals. (Patient taking differently: Take 237 mLs by mouth daily. ) 90 Bottle 0  . lidocaine (XYLOCAINE) 2 % solution 5 ml swallow before meals for radiation esophagitis 200 mL 0  . naproxen sodium (ALEVE) 220 MG tablet Take 220 mg by mouth daily as needed.    . potassium chloride SA (K-DUR) 20 MEQ tablet Take 1 tablet (20 mEq total) by mouth 2 (two) times daily for 7 days. 14 tablet 0  . prochlorperazine (COMPAZINE) 10 MG tablet Take 1 tablet (10 mg total) by mouth every 6 (six) hours as needed for nausea or vomiting. 30 tablet 0  . sucralfate (CARAFATE) 1 g tablet Take 1 tablet (1 g total) by mouth 4 (four) times daily -  with meals and at bedtime. 120 tablet 1  . tiotropium (SPIRIVA HANDIHALER) 18 MCG inhalation capsule Place 1 capsule (18 mcg total) into inhaler and inhale daily. 90 capsule 3   No current facility-administered medications for this visit.     SURGICAL HISTORY:  Past Surgical History:  Procedure Laterality Date  . APPENDECTOMY    . VIDEO BRONCHOSCOPY WITH ENDOBRONCHIAL NAVIGATION N/A 04/05/2018   Procedure: VIDEO BRONCHOSCOPY WITH ENDOBRONCHIAL NAVIGATION;  Surgeon: Garner Nash, DO;  Location: [facility];  Service: Thoracic;  Laterality: N/A;  . VIDEO BRONCHOSCOPY WITH ENDOBRONCHIAL NAVIGATION N/A 01/11/2019   Procedure: VIDEO BRONCHOSCOPY WITH ENDOBRONCHIAL NAVIGATION;  Surgeon: Garner Nash, DO;  Location: [facility];  Service: Thoracic;  Laterality: N/A;  . VIDEO BRONCHOSCOPY WITH ENDOBRONCHIAL ULTRASOUND N/A 01/11/2019   Procedure: VIDEO BRONCHOSCOPY WITH ENDOBRONCHIAL ULTRASOUND;  Surgeon: Garner Nash, DO;  Location: [facility];  Service: Thoracic;  Laterality: N/A;  . VIDEO BRONCHOSCOPY WITH RADIAL ENDOBRONCHIAL ULTRASOUND N/A 01/11/2019   Procedure: VIDEO BRONCHOSCOPY WITH RADIAL ENDOBRONCHIAL ULTRASOUND;  Surgeon: Garner Nash, DO;  Location: [facility];  Service: Thoracic;  Laterality: N/A;  . WISDOM TOOTH EXTRACTION       REVIEW OF SYSTEMS:  A comprehensive review of systems was negative except for: Constitutional: positive for fatigue Gastrointestinal: positive for odynophagia   PHYSICAL EXAMINATION: General appearance: alert, cooperative and no distress Head: Normocephalic, without obvious abnormality, atraumatic Neck: no adenopathy, no JVD, supple, symmetrical, trachea midline and thyroid not enlarged, symmetric, no tenderness/mass/nodules Lymph nodes: Cervical, supraclavicular, and axillary nodes normal. Resp: clear to auscultation bilaterally Back: symmetric, no curvature. ROM normal. No CVA tenderness. Cardio: regular rate and rhythm, S1, S2 normal, no murmur, click, rub or gallop GI: soft, non-tender; bowel sounds normal; no masses,  no organomegaly Extremities: extremities normal, atraumatic, no cyanosis or edema  ECOG PERFORMANCE STATUS: 1 - Symptomatic but completely ambulatory  Blood pressure 113/74, pulse 66, temperature 98.2 F (36.8 C), temperature source Oral, resp. rate 18, height 6\' 2"  (1.88 m), weight 131 lb (59.4 kg), SpO2 98 %.  LABORATORY DATA: Lab Results  Component Value Date   WBC 2.6 (L) 03/05/2019   HGB 7.6 (L) 03/05/2019   HCT 22.1 (L) 03/05/2019   MCV 72.0 (L) 03/05/2019   PLT 91 (L) 03/05/2019      Chemistry      Component Value Date/Time   NA 134 (L) 02/26/2019 1206   K 2.8 (LL) 02/26/2019 1206   CL 95 (L) 02/26/2019 1206   CO2 24 02/26/2019 1206   BUN 19 02/26/2019 1206   CREATININE 0.79 02/26/2019 1206      Component Value Date/Time   CALCIUM 8.7 (L) 02/26/2019 1206   ALKPHOS 56 02/26/2019 1206   AST 17 02/26/2019 1206   ALT 14 02/26/2019 1206   BILITOT 1.6 (H) 02/26/2019 1206       RADIOGRAPHIC STUDIES: No results found.  ASSESSMENT AND PLAN: This is a very pleasant 58 years old African-American male recently diagnosed with a stage IIIA non-small cell lung cancer, adenocarcinoma.  He is currently undergoing a course of concurrent chemoradiation with  weekly carboplatin and paclitaxel status post 5 cycles.   The patient has been tolerating this treatment well except for fatigue and odynophagia. I recommended for him to proceed with cycle #6 today as planned. I will see him back for follow-up visit in 5 weeks for evaluation after repeating CT scan of the chest for restaging of his disease. For the odynophagia he will continue his current treatment with Carafate and viscous lidocaine. For the chemotherapy-induced anemia, we will arrange for the patient to receive 1 unit of PRBCs transfusion today. The patient was advised to call immediately if he has any concerning symptoms in the interval. The patient voices understanding of current disease status and treatment options and is in agreement with the current care plan.  All questions were answered. The patient knows to call the clinic with any problems, questions or concerns. We can certainly see the patient much sooner if necessary.  I spent 10 minutes counseling the patient face to face. The total time spent in the appointment was 15 minutes.  Disclaimer: This note was dictated with voice recognition software. Similar sounding words  can inadvertently be transcribed and may not be corrected upon review.

## 2019-03-05 NOTE — Progress Notes (Signed)
58 year old male diagnosed with non-small cell lung cancer receiving concurrent chemo radiation therapy.  He is a patient of Dr. Julien Nordmann.  Past medical history includes alcohol abuse, COPD, and tobacco.  Medications include Compazine and Carafate.  Labs were reviewed.  Height: 6 feet 2 inches. Weight: 131 pounds. Usual body weight: 140 pounds on July 2. BMI: 16.82.  Patient reports he has difficulty swallowing secondary to radiation therapy. He is using Carafate which helps. Patient was given 1 complementary case of Ensure Enlive.  He reports it sends him to the bathroom.  Nutrition diagnosis: Underweight related to inadequate oral intake secondary to lung cancer and associated treatments as evidenced by BMI of 16.82.  Intervention: Educated patient to increase small frequent meals and snacks with adequate calories and protein. Provided suggestions on ways to tolerate Ensure Enlive. Reviewed strategies for consuming softer protein foods. Provided fact sheet.  Questions were answered.  Teach back method used.  Contact information given.  Monitoring, evaluation, goals: Patient will tolerate increased calories and protein to minimize weight loss.  Next visit: We will follow as needed.  Patient has my contact information for questions and concerns.  **Disclaimer: This note was dictated with voice recognition software. Similar sounding words can inadvertently be transcribed and this note may contain transcription errors which may not have been corrected upon publication of note.**

## 2019-03-05 NOTE — Patient Instructions (Signed)
[facility] Discharge Instructions for Patients Receiving Chemotherapy  Today you received the following chemotherapy agents: Taxol and Carboplatin   To help prevent nausea and vomiting after your treatment, we encourage you to take your nausea medication as directed.    If you develop nausea and vomiting that is not controlled by your nausea medication, call the clinic.   BELOW ARE SYMPTOMS THAT SHOULD BE REPORTED IMMEDIATELY:  *FEVER GREATER THAN 100.5 F  *CHILLS WITH OR WITHOUT FEVER  NAUSEA AND VOMITING THAT IS NOT CONTROLLED WITH YOUR NAUSEA MEDICATION  *UNUSUAL SHORTNESS OF BREATH  *UNUSUAL BRUISING OR BLEEDING  TENDERNESS IN MOUTH AND THROAT WITH OR WITHOUT PRESENCE OF ULCERS  *URINARY PROBLEMS  *BOWEL PROBLEMS  UNUSUAL RASH Items with * indicate a potential emergency and should be followed up as soon as possible.  Feel free to call the clinic should you have any questions or concerns. The clinic phone number is [phone].  Please show the [facility] at check-in to the Emergency Department and triage nurse.   Blood Transfusion, Adult, Care After This sheet gives you information about how to care for yourself after your procedure. Your doctor may also give you more specific instructions. If you have problems or questions, contact your doctor. Follow these instructions at home:   Take over-the-counter and prescription medicines only as told by your doctor.  Go back to your normal activities as told by your doctor.  Follow instructions from your doctor about how to take care of the area where an IV tube was put into your vein (insertion site). Make sure you: ? Wash your hands with soap and water before you change your bandage (dressing). If there is no soap and water, use hand sanitizer. ? Change your bandage as told by your doctor.  Check your IV insertion site every day for signs of infection. Check for: ? More redness, swelling, or  pain. ? More fluid or blood. ? Warmth. ? Pus or a bad smell. Contact a doctor if:  You have more redness, swelling, or pain around the IV insertion site.  You have more fluid or blood coming from the IV insertion site.  Your IV insertion site feels warm to the touch.  You have pus or a bad smell coming from the IV insertion site.  Your pee (urine) turns pink, red, or brown.  You feel weak after doing your normal activities. Get help right away if:  You have signs of a serious allergic or body defense (immune) system reaction, including: ? Itchiness. ? Hives. ? Trouble breathing. ? Anxiety. ? Pain in your chest or lower back. ? Fever, flushing, and chills. ? Fast pulse. ? Rash. ? Watery poop (diarrhea). ? Throwing up (vomiting). ? Dark pee. ? Serious headache. ? Dizziness. ? Stiff neck. ? Yellow color in your face or the white parts of your eyes (jaundice). Summary  After a blood transfusion, return to your normal activities as told by your doctor.  Every day, check for signs of infection where the IV tube was put into your vein.  Some signs of infection are warm skin, more redness and pain, more fluid or blood, and pus or a bad smell where the needle went in.  Contact your doctor if you feel weak or have any unusual symptoms. This information is not intended to replace advice given to you by your health care provider. Make sure you discuss any questions you have with your health care provider. Document Released:  07/19/2014 Document Revised: 11/02/2017 Document Reviewed: 02/20/2016 Elsevier Patient Education  [place].

## 2019-03-06 ENCOUNTER — Telehealth: Payer: Self-pay | Admitting: Internal Medicine

## 2019-03-06 ENCOUNTER — Ambulatory Visit
Admission: RE | Admit: 2019-03-06 | Discharge: 2019-03-06 | Disposition: A | Discharge status: Home / Self Care | Payer: Medicaid Other | Source: Ambulatory Visit | Attending: Radiation Oncology | Admitting: Radiation Oncology

## 2019-03-06 ENCOUNTER — Other Ambulatory Visit: Payer: Self-pay

## 2019-03-06 DIAGNOSIS — Z510 Encounter for antineoplastic radiation therapy: Secondary | ICD-10-CM | POA: Diagnosis not present

## 2019-03-06 DIAGNOSIS — C3431 Malignant neoplasm of lower lobe, right bronchus or lung: Secondary | ICD-10-CM | POA: Diagnosis not present

## 2019-03-06 LAB — TYPE AND SCREEN
ABO/RH(D): B POS
Antibody Screen: NEGATIVE
Unit division: 0

## 2019-03-06 LAB — BPAM RBC
Blood Product Expiration Date: 202009242359
ISSUE DATE / TIME: 202008241557
Unit Type and Rh: 7300

## 2019-03-06 LAB — ABO/RH: ABO/RH(D): B POS

## 2019-03-06 NOTE — Telephone Encounter (Signed)
Scheduled appt per 8/24 los - mailed letter and pt to get an updated schedule next chemo

## 2019-03-07 ENCOUNTER — Ambulatory Visit
Admission: RE | Admit: 2019-03-07 | Discharge: 2019-03-07 | Disposition: A | Discharge status: Home / Self Care | Payer: Medicaid Other | Source: Ambulatory Visit | Attending: Radiation Oncology | Admitting: Radiation Oncology

## 2019-03-07 ENCOUNTER — Other Ambulatory Visit: Payer: Self-pay

## 2019-03-07 DIAGNOSIS — Z510 Encounter for antineoplastic radiation therapy: Secondary | ICD-10-CM | POA: Diagnosis not present

## 2019-03-07 DIAGNOSIS — C3431 Malignant neoplasm of lower lobe, right bronchus or lung: Secondary | ICD-10-CM | POA: Diagnosis not present

## 2019-03-08 ENCOUNTER — Other Ambulatory Visit: Payer: Self-pay

## 2019-03-08 ENCOUNTER — Ambulatory Visit
Admission: RE | Admit: 2019-03-08 | Discharge: 2019-03-08 | Disposition: A | Discharge status: Home / Self Care | Payer: Medicaid Other | Source: Ambulatory Visit | Attending: Radiation Oncology | Admitting: Radiation Oncology

## 2019-03-08 DIAGNOSIS — Z510 Encounter for antineoplastic radiation therapy: Secondary | ICD-10-CM | POA: Diagnosis not present

## 2019-03-08 DIAGNOSIS — C3431 Malignant neoplasm of lower lobe, right bronchus or lung: Secondary | ICD-10-CM | POA: Diagnosis not present

## 2019-03-09 ENCOUNTER — Other Ambulatory Visit: Payer: Self-pay

## 2019-03-09 ENCOUNTER — Ambulatory Visit
Admission: RE | Admit: 2019-03-09 | Discharge: 2019-03-09 | Disposition: A | Discharge status: Home / Self Care | Payer: Medicaid Other | Source: Ambulatory Visit | Attending: Radiation Oncology | Admitting: Radiation Oncology

## 2019-03-09 DIAGNOSIS — C3431 Malignant neoplasm of lower lobe, right bronchus or lung: Secondary | ICD-10-CM | POA: Diagnosis not present

## 2019-03-09 DIAGNOSIS — Z510 Encounter for antineoplastic radiation therapy: Secondary | ICD-10-CM | POA: Diagnosis not present

## 2019-03-12 ENCOUNTER — Other Ambulatory Visit: Payer: Self-pay

## 2019-03-12 ENCOUNTER — Ambulatory Visit
Admission: RE | Admit: 2019-03-12 | Discharge: 2019-03-12 | Disposition: A | Discharge status: Home / Self Care | Payer: Medicaid Other | Source: Ambulatory Visit | Attending: Radiation Oncology | Admitting: Radiation Oncology

## 2019-03-12 ENCOUNTER — Inpatient Hospital Stay: Payer: Medicaid Other

## 2019-03-12 ENCOUNTER — Other Ambulatory Visit: Payer: Self-pay | Admitting: Internal Medicine

## 2019-03-12 VITALS — BP 109/64 | HR 55 | Temp 98.5°F | Resp 17

## 2019-03-12 DIAGNOSIS — C3491 Malignant neoplasm of unspecified part of right bronchus or lung: Secondary | ICD-10-CM

## 2019-03-12 DIAGNOSIS — Z510 Encounter for antineoplastic radiation therapy: Secondary | ICD-10-CM | POA: Diagnosis not present

## 2019-03-12 DIAGNOSIS — E876 Hypokalemia: Secondary | ICD-10-CM

## 2019-03-12 DIAGNOSIS — Z5111 Encounter for antineoplastic chemotherapy: Secondary | ICD-10-CM | POA: Diagnosis not present

## 2019-03-12 DIAGNOSIS — C3431 Malignant neoplasm of lower lobe, right bronchus or lung: Secondary | ICD-10-CM | POA: Diagnosis not present

## 2019-03-12 LAB — CMP (CANCER CENTER ONLY)
ALT: 18 U/L (ref 0–44)
AST: 23 U/L (ref 15–41)
Albumin: 2.9 g/dL — ABNORMAL LOW (ref 3.5–5.0)
Alkaline Phosphatase: 50 U/L (ref 38–126)
Anion gap: 12 (ref 5–15)
BUN: 7 mg/dL (ref 6–20)
CO2: 32 mmol/L (ref 22–32)
Calcium: 8.3 mg/dL — ABNORMAL LOW (ref 8.9–10.3)
Chloride: 92 mmol/L — ABNORMAL LOW (ref 98–111)
Creatinine: 0.65 mg/dL (ref 0.61–1.24)
GFR, Est AFR Am: >60 mL/min (ref >60)
GFR, Estimated: >60 mL/min (ref >60)
Glucose, Bld: 90 mg/dL (ref 70–99)
Potassium: 2.7 mmol/L — CL (ref 3.5–5.1)
Sodium: 136 mmol/L (ref 135–145)
Total Bilirubin: 1 mg/dL (ref 0.3–1.2)
Total Protein: 6.6 g/dL (ref 6.5–8.1)

## 2019-03-12 LAB — CBC WITH DIFFERENTIAL (CANCER CENTER ONLY)
Abs Immature Granulocytes: 0.02 10*3/uL (ref 0.00–0.07)
Basophils Absolute: 0 10*3/uL (ref 0.0–0.1)
Basophils Relative: 0 %
Eosinophils Absolute: 0 10*3/uL (ref 0.0–0.5)
Eosinophils Relative: 0 %
HCT: 26.4 % — ABNORMAL LOW (ref 39.0–52.0)
Hemoglobin: 9 g/dL — ABNORMAL LOW (ref 13.0–17.0)
Immature Granulocytes: 1 %
Lymphocytes Relative: 17 %
Lymphs Abs: 0.4 10*3/uL — ABNORMAL LOW (ref 0.7–4.0)
MCH: 26 pg (ref 26.0–34.0)
MCHC: 34.1 g/dL (ref 30.0–36.0)
MCV: 76.3 fL — ABNORMAL LOW (ref 80.0–100.0)
Monocytes Absolute: 0.7 10*3/uL (ref 0.1–1.0)
Monocytes Relative: 30 %
Neutro Abs: 1.2 10*3/uL — ABNORMAL LOW (ref 1.7–7.7)
Neutrophils Relative %: 52 %
Platelet Count: 111 10*3/uL — ABNORMAL LOW (ref 150–400)
RBC: 3.46 MIL/uL — ABNORMAL LOW (ref 4.22–5.81)
RDW: 18.7 % — ABNORMAL HIGH (ref 11.5–15.5)
WBC Count: 2.3 10*3/uL — ABNORMAL LOW (ref 4.0–10.5)
nRBC: 1.8 % — ABNORMAL HIGH (ref 0.0–0.2)

## 2019-03-12 MED ORDER — PALONOSETRON HCL INJECTION 0.25 MG/5ML
0.2500 mg | Freq: Once | INTRAVENOUS | Status: AC
  Administered 2019-03-12: 0.25 mg via INTRAVENOUS

## 2019-03-12 MED ORDER — SODIUM CHLORIDE 0.9 % IV SOLN
45.0000 mg/m2 | Freq: Once | INTRAVENOUS | Status: AC
  Administered 2019-03-12: 15:00:00 84 mg via INTRAVENOUS
  Filled 2019-03-12: qty 14

## 2019-03-12 MED ORDER — PALONOSETRON HCL INJECTION 0.25 MG/5ML
INTRAVENOUS | Status: AC
  Filled 2019-03-12: qty 5

## 2019-03-12 MED ORDER — SODIUM CHLORIDE 0.9 % IV SOLN
Freq: Once | INTRAVENOUS | Status: AC
  Administered 2019-03-12: 13:00:00 via INTRAVENOUS
  Filled 2019-03-12: qty 250

## 2019-03-12 MED ORDER — POTASSIUM CHLORIDE 10 MEQ/100ML IV SOLN
10.0000 meq | INTRAVENOUS | Status: AC
  Administered 2019-03-12 (×4): 10 meq via INTRAVENOUS
  Filled 2019-03-12: qty 100

## 2019-03-12 MED ORDER — SODIUM CHLORIDE 0.9 % IV SOLN
20.0000 mg | Freq: Once | INTRAVENOUS | Status: AC
  Administered 2019-03-12: 14:00:00 20 mg via INTRAVENOUS
  Filled 2019-03-12: qty 20

## 2019-03-12 MED ORDER — SODIUM CHLORIDE 0.9 % IV SOLN
INTRAVENOUS | Status: DC
  Administered 2019-03-12: 14:00:00 via INTRAVENOUS
  Filled 2019-03-12: qty 250

## 2019-03-12 MED ORDER — SODIUM CHLORIDE 0.9 % IV SOLN
222.2000 mg | Freq: Once | INTRAVENOUS | Status: AC
  Administered 2019-03-12: 16:00:00 220 mg via INTRAVENOUS
  Filled 2019-03-12: qty 22

## 2019-03-12 MED ORDER — DIPHENHYDRAMINE HCL 50 MG/ML IJ SOLN
50.0000 mg | Freq: Once | INTRAMUSCULAR | Status: AC
  Administered 2019-03-12: 50 mg via INTRAVENOUS

## 2019-03-12 MED ORDER — FAMOTIDINE IN NACL 20-0.9 MG/50ML-% IV SOLN
20.0000 mg | Freq: Once | INTRAVENOUS | Status: AC
  Administered 2019-03-12: 14:00:00 20 mg via INTRAVENOUS

## 2019-03-12 MED ORDER — DIPHENHYDRAMINE HCL 50 MG/ML IJ SOLN
INTRAMUSCULAR | Status: AC
  Filled 2019-03-12: qty 1

## 2019-03-12 MED ORDER — FAMOTIDINE IN NACL 20-0.9 MG/50ML-% IV SOLN
INTRAVENOUS | Status: AC
  Filled 2019-03-12: qty 50

## 2019-03-12 NOTE — Patient Instructions (Signed)
   [facility] Discharge Instructions for Patients Receiving Chemotherapy  Today you received the following chemotherapy agents Taxol and Carboplatin   To help prevent nausea and vomiting after your treatment, we encourage you to take your nausea medication as directed.    If you develop nausea and vomiting that is not controlled by your nausea medication, call the clinic.   BELOW ARE SYMPTOMS THAT SHOULD BE REPORTED IMMEDIATELY:  *FEVER GREATER THAN 100.5 F  *CHILLS WITH OR WITHOUT FEVER  NAUSEA AND VOMITING THAT IS NOT CONTROLLED WITH YOUR NAUSEA MEDICATION  *UNUSUAL SHORTNESS OF BREATH  *UNUSUAL BRUISING OR BLEEDING  TENDERNESS IN MOUTH AND THROAT WITH OR WITHOUT PRESENCE OF ULCERS  *URINARY PROBLEMS  *BOWEL PROBLEMS  UNUSUAL RASH Items with * indicate a potential emergency and should be followed up as soon as possible.  Feel free to call the clinic should you have any questions or concerns. The clinic phone number is [phone].  Please show the CHEMO ALERT CARD at check-in to the Emergency Department and triage nurse.   

## 2019-03-12 NOTE — Progress Notes (Signed)
Reviewed pt labs (CBC and CMET) with Dr. [name] and pt ok to treat with ANC 1.2 and Potassium 2.7

## 2019-03-13 ENCOUNTER — Other Ambulatory Visit: Payer: Self-pay

## 2019-03-13 ENCOUNTER — Ambulatory Visit
Admission: RE | Admit: 2019-03-13 | Discharge: 2019-03-13 | Disposition: A | Discharge status: Home / Self Care | Payer: Medicaid Other | Source: Ambulatory Visit | Attending: Radiation Oncology | Admitting: Radiation Oncology

## 2019-03-13 DIAGNOSIS — C3431 Malignant neoplasm of lower lobe, right bronchus or lung: Secondary | ICD-10-CM | POA: Diagnosis not present

## 2019-03-13 DIAGNOSIS — F17210 Nicotine dependence, cigarettes, uncomplicated: Secondary | ICD-10-CM | POA: Diagnosis not present

## 2019-03-13 DIAGNOSIS — Z510 Encounter for antineoplastic radiation therapy: Secondary | ICD-10-CM | POA: Diagnosis not present

## 2019-03-14 ENCOUNTER — Other Ambulatory Visit: Payer: Self-pay

## 2019-03-14 ENCOUNTER — Ambulatory Visit
Admission: RE | Admit: 2019-03-14 | Discharge: 2019-03-14 | Disposition: A | Discharge status: Home / Self Care | Payer: Medicaid Other | Source: Ambulatory Visit | Attending: Radiation Oncology | Admitting: Radiation Oncology

## 2019-03-14 ENCOUNTER — Encounter: Payer: Self-pay | Admitting: Radiation Oncology

## 2019-03-14 DIAGNOSIS — C3431 Malignant neoplasm of lower lobe, right bronchus or lung: Secondary | ICD-10-CM | POA: Diagnosis not present

## 2019-03-14 DIAGNOSIS — Z510 Encounter for antineoplastic radiation therapy: Secondary | ICD-10-CM | POA: Diagnosis not present

## 2019-03-16 ENCOUNTER — Telehealth: Payer: Self-pay | Admitting: Internal Medicine

## 2019-03-16 NOTE — Telephone Encounter (Signed)
Returned patient's phone call regarding rescheduling 09/25 lab appointment, patient requested lab appointment will align with CT scan. Appointment has been moved.

## 2019-03-22 ENCOUNTER — Ambulatory Visit: Attending: Cardiovascular Disease | Primary: Internal Medicine

## 2019-03-22 ENCOUNTER — Ambulatory Visit
Admit: 2019-03-22 | Discharge: 2019-03-22 | Payer: BLUE CROSS/BLUE SHIELD | Attending: Cardiovascular Disease | Primary: Internal Medicine

## 2019-03-22 DIAGNOSIS — I480 Paroxysmal atrial fibrillation: Secondary | ICD-10-CM

## 2019-03-22 NOTE — Progress Notes (Signed)
Progress  Notes by Fenton Foy, MD at 03/22/19 0815                Author: Fenton Foy, MD  Service: --  Author Type: Physician       Filed: 03/22/19 0849  Encounter Date: 03/22/2019  Status: Signed          Editor: Fenton Foy, MD (Physician)                         [facility]   [address]   [place] [address]   PHONE: [phone]      NAME: Kenneth Schmidt   DOB: 1961-01-29       HPI   Kenneth Schmidt is a 58 y.o.  male seen for a follow up visit regarding    No chief complaint on file.     He is back in f/u on his pafib.  He had no afib on his monitor. He has no chest pains or dyspnea.          Past Medical History, Past Surgical History, Family history, Social History, and Medications were all reviewed with the patient today and updated as necessary.            Patient Active Problem List          Diagnosis        ?  Paroxysmal atrial fibrillation (HCC)     ?  Controlled type 2 diabetes mellitus with kidney complication, with long-term current use of insulin ([facility])        ?  Stage 3 chronic kidney disease (HCC)        No Known Allergies     Past Medical History:        Diagnosis  Date         ?  Atrial fibrillation ([facility])       ?  CAD (coronary artery disease)           ?  Chronic kidney disease            Past Surgical History:         Procedure  Laterality  Date          ?  HX ACL RECONSTRUCTION  Left            ?  HX CORONARY STENT PLACEMENT            No family history on file.      Social History          Tobacco Use         ?  Smoking status:  Current Every Day Smoker     ?  Smokeless tobacco:  Never Used       Substance Use Topics         ?  Alcohol use:  Not Currently                 Review of Systems    Constitutional:         Exercise capacity stable     Respiratory: Negative for hemoptysis.     Gastrointestinal: Negative for blood in stool.    Genitourinary: Negative for hematuria.    Musculoskeletal: Negative for falls.    Neurological: Negative for loss of  consciousness.                       Wt  Readings from Last 3 Encounters:        02/19/19  224 lb 9.6 oz (101.9 kg)          BP Readings from Last 3 Encounters:        02/19/19  120/68           There were no vitals taken for this visit.         Physical Exam    Constitutional: He appears well-developed and well-nourished.    HENT:    Head: Normocephalic.    Neck: No JVD present. Carotid bruit is not present.    Cardiovascular: Normal rate, regular rhythm and normal heart sounds.    Pulmonary/Chest: Effort normal and breath sounds normal.    Abdominal: Soft.    Musculoskeletal:          General: No edema.   Neurological: He is alert.    Skin: Skin is warm and dry.   Psychiatric: He has a normal mood and affect.          Medical problems and test results were reviewed with the patient today.       No results found for any visits on 03/22/19.         No results found for: HBA1C, HGBE8, HBA1CPOC, HBA1CEXT, HBA1CEXT      No results found for: WBC, WBCLT, HGBPOC, HGB, HGBP, HCTPOC, HCT, PHCT, RBCH, PLT, MCV, HGBEXT, HCTEXT, PLTEXT, HGBEXT, HCTEXT, PLTEXT      No results found for: NA, K, CL, CO2, AGAP, GLU, BUN, CREA, BUCR, GFRAA, GFRNA, CA, GFRAA      No results found for: ALT, GGT, GGTP, AP, APIT, APX, CBIL, TBIL, TBILI      No results found for: CHOL, CHOLPOCT, CHOLX, CHLST, CHOLV, HDL, HDLPOC, HDLP, LDL, LDLCPOC, LDLC, DLDLP, VLDLC, VLDL, TGLX, TRIGL, TRIGP, TGLPOCT, CHHD, CHHDX         ASSESSMENT and PLAN      Diagnoses and all orders for this visit:      1. Paroxysmal atrial fibrillation (HCC)  stable on antiarrhythmic no signs or symptoms   continue current meds and anticoagulation for CVA  prevention  no changes         2. Controlled type 2 diabetes mellitus with other diabetic kidney complication, with long-term current use of insulin (HCC)  on OMT  with statin and Jardiance   continue with close f/u Dr Oda CoganKrishniah       3. Stage 3 chronic kidney disease (HCC)      4 ASCVD   EF 45%  inferior wall motion  abnormality.      no symptoms  good meds with get [facility] cath report                                         Kenneth SpatesJohn E Timmi Devora, MD   03/22/2019   5:59 AM

## 2019-03-22 NOTE — Progress Notes (Signed)
[facility]  [address], [id number]  [place], [place] [id number]  PHONE: [phone]    NAME: Kenneth Schmidt  DOB: 01/09/1961     HPI  Kenneth SarnaRonald Randleman is a 58 y.o. male seen for a follow up visit regarding   No chief complaint on file.    He is back in f/u on his pafib.  He had no afib on his monitor. He has no chest pains or dyspnea.       Past Medical History, Past Surgical History, Family history, Social History, and Medications were all reviewed with the patient today and updated as necessary.       Patient Active Problem List    Diagnosis   ??? Paroxysmal atrial fibrillation (HCC)   ??? Controlled type 2 diabetes mellitus with kidney complication, with long-term current use of insulin (HCC)   ??? Stage 3 chronic kidney disease (HCC)     No Known Allergies  Past Medical History:   Diagnosis Date   ??? Atrial fibrillation (HCC)    ??? CAD (coronary artery disease)    ??? Chronic kidney disease      Past Surgical History:   Procedure Laterality Date   ??? HX ACL RECONSTRUCTION Left    ??? HX CORONARY STENT PLACEMENT       No family history on file.   Social History     Tobacco Use   ??? Smoking status: Current Every Day Smoker   ??? Smokeless tobacco: Never Used   Substance Use Topics   ??? Alcohol use: Not Currently           Review of Systems   Constitutional:        Exercise capacity stable    Respiratory: Negative for hemoptysis.    Gastrointestinal: Negative for blood in stool.   Genitourinary: Negative for hematuria.   Musculoskeletal: Negative for falls.   Neurological: Negative for loss of consciousness.               Wt Readings from Last 3 Encounters:   02/19/19 224 lb 9.6 oz (101.9 kg)     BP Readings from Last 3 Encounters:   02/19/19 120/68       There were no vitals taken for this visit.      Physical Exam   Constitutional: He appears well-developed and well-nourished.   HENT:   Head: Normocephalic.   Neck: No JVD present. Carotid bruit is not present.    Cardiovascular: Normal rate, regular rhythm and normal heart sounds.   Pulmonary/Chest: Effort normal and breath sounds normal.   Abdominal: Soft.   Musculoskeletal:         General: No edema.   Neurological: He is alert.   Skin: Skin is warm and dry.   Psychiatric: He has a normal mood and affect.       Medical problems and test results were reviewed with the patient today.     No results found for any visits on 03/22/19.      No results found for: HBA1C, HGBE8, HBA1CPOC, HBA1CEXT, HBA1CEXT    No results found for: WBC, WBCLT, HGBPOC, HGB, HGBP, HCTPOC, HCT, PHCT, RBCH, PLT, MCV, HGBEXT, HCTEXT, PLTEXT, HGBEXT, HCTEXT, PLTEXT    No results found for: NA, K, CL, CO2, AGAP, GLU, BUN, CREA, BUCR, GFRAA, GFRNA, CA, GFRAA    No results found for: ALT, GGT, GGTP, AP, APIT, APX, CBIL, TBIL, TBILI    No results found for: CHOL, CHOLPOCT, CHOLX, CHLST, CHOLV, HDL, HDLPOC, HDLP,  LDL, LDLCPOC, LDLC, DLDLP, VLDLC, VLDL, TGLX, TRIGL, TRIGP, TGLPOCT, CHHD, CHHDX      ASSESSMENT and PLAN    Diagnoses and all orders for this visit:    1. Paroxysmal atrial fibrillation (HCC)  stable on antiarrhythmic no signs or symptoms   continue current meds and anticoagulation for CVA prevention  no changes      2. Controlled type 2 diabetes mellitus with other diabetic kidney complication, with long-term current use of insulin (HCC)  on OMT  with statin and Jardiance   continue with close f/u Dr Oda Cogan     3. Stage 3 chronic kidney disease (HCC)    4 ASCVD   EF 45%  inferior wall motion abnormality.      no symptoms  good meds with get [facility] cath report                            Seabron Spates, MD  03/22/2019  5:59 AM

## 2019-03-30 ENCOUNTER — Telehealth: Payer: Self-pay | Admitting: *Deleted

## 2019-03-30 MED ORDER — LIDOCAINE VISCOUS HCL 2 % MT SOLN: OROMUCOSAL | 0 refills | Status: DC

## 2019-03-30 NOTE — Telephone Encounter (Signed)
Received message from Tyler [name] that pt needs a refill on his 2% lidocaine. This was refilled and pt notified.

## 2019-04-06 ENCOUNTER — Inpatient Hospital Stay (HOSPITAL_COMMUNITY)
Admission: EM | Admit: 2019-04-06 | Discharge: 2019-04-10 | DRG: 175 | Disposition: A | Discharge status: Home / Self Care | Payer: Medicaid Other | Attending: Internal Medicine | Admitting: Internal Medicine

## 2019-04-06 ENCOUNTER — Emergency Department (HOSPITAL_COMMUNITY): Payer: Medicaid Other

## 2019-04-06 ENCOUNTER — Other Ambulatory Visit: Payer: Self-pay | Admitting: *Deleted

## 2019-04-06 ENCOUNTER — Other Ambulatory Visit: Payer: Self-pay

## 2019-04-06 ENCOUNTER — Telehealth: Payer: Self-pay | Admitting: *Deleted

## 2019-04-06 ENCOUNTER — Ambulatory Visit (HOSPITAL_COMMUNITY)
Admission: RE | Admit: 2019-04-06 | Discharge: 2019-04-06 | Disposition: A | Discharge status: Home / Self Care | Payer: Medicaid Other | Source: Ambulatory Visit | Attending: Internal Medicine | Admitting: Internal Medicine

## 2019-04-06 ENCOUNTER — Encounter (HOSPITAL_COMMUNITY): Payer: Self-pay | Admitting: Emergency Medicine

## 2019-04-06 ENCOUNTER — Inpatient Hospital Stay: Payer: Medicaid Other | Attending: Internal Medicine

## 2019-04-06 DIAGNOSIS — D649 Anemia, unspecified: Secondary | ICD-10-CM | POA: Diagnosis not present

## 2019-04-06 DIAGNOSIS — R000 Tachycardia, unspecified: Secondary | ICD-10-CM | POA: Diagnosis not present

## 2019-04-06 DIAGNOSIS — J90 Pleural effusion, not elsewhere classified: Secondary | ICD-10-CM

## 2019-04-06 DIAGNOSIS — C3490 Malignant neoplasm of unspecified part of unspecified bronchus or lung: Secondary | ICD-10-CM

## 2019-04-06 DIAGNOSIS — M549 Dorsalgia, unspecified: Secondary | ICD-10-CM | POA: Diagnosis present

## 2019-04-06 DIAGNOSIS — Z681 Body mass index (BMI) 19 or less, adult: Secondary | ICD-10-CM

## 2019-04-06 DIAGNOSIS — I82433 Acute embolism and thrombosis of popliteal vein, bilateral: Secondary | ICD-10-CM | POA: Diagnosis present

## 2019-04-06 DIAGNOSIS — Z79899 Other long term (current) drug therapy: Secondary | ICD-10-CM

## 2019-04-06 DIAGNOSIS — E876 Hypokalemia: Secondary | ICD-10-CM

## 2019-04-06 DIAGNOSIS — Z20828 Contact with and (suspected) exposure to other viral communicable diseases: Secondary | ICD-10-CM | POA: Diagnosis present

## 2019-04-06 DIAGNOSIS — I82443 Acute embolism and thrombosis of tibial vein, bilateral: Secondary | ICD-10-CM | POA: Diagnosis present

## 2019-04-06 DIAGNOSIS — I2699 Other pulmonary embolism without acute cor pulmonale: Secondary | ICD-10-CM | POA: Diagnosis not present

## 2019-04-06 DIAGNOSIS — C3491 Malignant neoplasm of unspecified part of right bronchus or lung: Secondary | ICD-10-CM | POA: Diagnosis present

## 2019-04-06 DIAGNOSIS — Z9889 Other specified postprocedural states: Secondary | ICD-10-CM

## 2019-04-06 DIAGNOSIS — C3431 Malignant neoplasm of lower lobe, right bronchus or lung: Secondary | ICD-10-CM | POA: Insufficient documentation

## 2019-04-06 DIAGNOSIS — G8929 Other chronic pain: Secondary | ICD-10-CM | POA: Diagnosis present

## 2019-04-06 DIAGNOSIS — Z720 Tobacco use: Secondary | ICD-10-CM | POA: Diagnosis present

## 2019-04-06 DIAGNOSIS — F1010 Alcohol abuse, uncomplicated: Secondary | ICD-10-CM | POA: Diagnosis present

## 2019-04-06 DIAGNOSIS — D509 Iron deficiency anemia, unspecified: Secondary | ICD-10-CM | POA: Diagnosis present

## 2019-04-06 DIAGNOSIS — K08409 Partial loss of teeth, unspecified cause, unspecified class: Secondary | ICD-10-CM | POA: Diagnosis present

## 2019-04-06 DIAGNOSIS — Z23 Encounter for immunization: Secondary | ICD-10-CM

## 2019-04-06 DIAGNOSIS — J449 Chronic obstructive pulmonary disease, unspecified: Secondary | ICD-10-CM | POA: Diagnosis present

## 2019-04-06 DIAGNOSIS — F17210 Nicotine dependence, cigarettes, uncomplicated: Secondary | ICD-10-CM | POA: Diagnosis present

## 2019-04-06 DIAGNOSIS — E43 Unspecified severe protein-calorie malnutrition: Secondary | ICD-10-CM | POA: Diagnosis present

## 2019-04-06 DIAGNOSIS — I82412 Acute embolism and thrombosis of left femoral vein: Secondary | ICD-10-CM | POA: Diagnosis present

## 2019-04-06 DIAGNOSIS — R079 Chest pain, unspecified: Secondary | ICD-10-CM | POA: Diagnosis not present

## 2019-04-06 DIAGNOSIS — Z8701 Personal history of pneumonia (recurrent): Secondary | ICD-10-CM

## 2019-04-06 LAB — CMP (CANCER CENTER ONLY)
ALT: 6 U/L (ref 0–44)
AST: 15 U/L (ref 15–41)
Albumin: 2.2 g/dL — ABNORMAL LOW (ref 3.5–5.0)
Alkaline Phosphatase: 85 U/L (ref 38–126)
Anion gap: 9 (ref 5–15)
BUN: 7 mg/dL (ref 6–20)
CO2: 30 mmol/L (ref 22–32)
Calcium: 7.8 mg/dL — ABNORMAL LOW (ref 8.9–10.3)
Chloride: 97 mmol/L — ABNORMAL LOW (ref 98–111)
Creatinine: 0.54 mg/dL — ABNORMAL LOW (ref 0.61–1.24)
GFR, Est AFR Am: >60 mL/min (ref >60)
GFR, Estimated: >60 mL/min (ref >60)
Glucose, Bld: 61 mg/dL — ABNORMAL LOW (ref 70–99)
Potassium: 2.9 mmol/L — CL (ref 3.5–5.1)
Sodium: 136 mmol/L (ref 135–145)
Total Bilirubin: 0.8 mg/dL (ref 0.3–1.2)
Total Protein: 6.1 g/dL — ABNORMAL LOW (ref 6.5–8.1)

## 2019-04-06 LAB — CBC
HCT: 21 % — ABNORMAL LOW (ref 39.0–52.0)
Hemoglobin: 6.8 g/dL — CL (ref 13.0–17.0)
MCH: 25.9 pg — ABNORMAL LOW (ref 26.0–34.0)
MCHC: 32.4 g/dL (ref 30.0–36.0)
MCV: 79.8 fL — ABNORMAL LOW (ref 80.0–100.0)
Platelets: 151 10*3/uL (ref 150–400)
RBC: 2.63 MIL/uL — ABNORMAL LOW (ref 4.22–5.81)
RDW: 22.5 % — ABNORMAL HIGH (ref 11.5–15.5)
WBC: 9.6 10*3/uL (ref 4.0–10.5)
nRBC: 0 % (ref 0.0–0.2)

## 2019-04-06 LAB — BASIC METABOLIC PANEL
Anion gap: 12 (ref 5–15)
BUN: 6 mg/dL (ref 6–20)
CO2: 27 mmol/L (ref 22–32)
Calcium: 7.8 mg/dL — ABNORMAL LOW (ref 8.9–10.3)
Chloride: 93 mmol/L — ABNORMAL LOW (ref 98–111)
Creatinine, Ser: 0.38 mg/dL — ABNORMAL LOW (ref 0.61–1.24)
GFR calc Af Amer: >60 mL/min (ref >60)
GFR calc non Af Amer: >60 mL/min (ref >60)
Glucose, Bld: 91 mg/dL (ref 70–99)
Potassium: 3 mmol/L — ABNORMAL LOW (ref 3.5–5.1)
Sodium: 132 mmol/L — ABNORMAL LOW (ref 135–145)

## 2019-04-06 LAB — CBC WITH DIFFERENTIAL (CANCER CENTER ONLY)
Abs Immature Granulocytes: 0.08 10*3/uL — ABNORMAL HIGH (ref 0.00–0.07)
Basophils Absolute: 0 10*3/uL (ref 0.0–0.1)
Basophils Relative: 0 %
Eosinophils Absolute: 0 10*3/uL (ref 0.0–0.5)
Eosinophils Relative: 0 %
HCT: 22.2 % — ABNORMAL LOW (ref 39.0–52.0)
Hemoglobin: 7.4 g/dL — ABNORMAL LOW (ref 13.0–17.0)
Immature Granulocytes: 1 %
Lymphocytes Relative: 13 %
Lymphs Abs: 1.4 10*3/uL (ref 0.7–4.0)
MCH: 26 pg (ref 26.0–34.0)
MCHC: 33.3 g/dL (ref 30.0–36.0)
MCV: 77.9 fL — ABNORMAL LOW (ref 80.0–100.0)
Monocytes Absolute: 1.3 10*3/uL — ABNORMAL HIGH (ref 0.1–1.0)
Monocytes Relative: 12 %
Neutro Abs: 7.9 10*3/uL — ABNORMAL HIGH (ref 1.7–7.7)
Neutrophils Relative %: 74 %
Platelet Count: 149 10*3/uL — ABNORMAL LOW (ref 150–400)
RBC: 2.85 MIL/uL — ABNORMAL LOW (ref 4.22–5.81)
RDW: 22.5 % — ABNORMAL HIGH (ref 11.5–15.5)
WBC Count: 10.7 10*3/uL — ABNORMAL HIGH (ref 4.0–10.5)
nRBC: 0 % (ref 0.0–0.2)

## 2019-04-06 LAB — TROPONIN I (HIGH SENSITIVITY): Troponin I (High Sensitivity): 5 ng/L (ref <18)

## 2019-04-06 LAB — PROTIME-INR
INR: 1.2 (ref 0.8–1.2)
Prothrombin Time: 15.1 seconds (ref 11.4–15.2)

## 2019-04-06 LAB — APTT: aPTT: 34 seconds (ref 24–36)

## 2019-04-06 LAB — BRAIN NATRIURETIC PEPTIDE: B Natriuretic Peptide: 251.3 pg/mL — ABNORMAL HIGH (ref 0.0–100.0)

## 2019-04-06 LAB — PREPARE RBC (CROSSMATCH)

## 2019-04-06 LAB — POC OCCULT BLOOD, ED: Fecal Occult Bld: NEGATIVE

## 2019-04-06 LAB — MAGNESIUM: Magnesium: 1.3 mg/dL — ABNORMAL LOW (ref 1.7–2.4)

## 2019-04-06 MED ORDER — SODIUM CHLORIDE 0.9% FLUSH: 3.0000 mL | Freq: Once | INTRAVENOUS | Status: DC

## 2019-04-06 MED ORDER — SODIUM CHLORIDE (PF) 0.9 % IJ SOLN
INTRAMUSCULAR | Status: AC
  Filled 2019-04-06: qty 50

## 2019-04-06 MED ORDER — IOHEXOL 300 MG/ML  SOLN
75.0000 mL | Freq: Once | INTRAMUSCULAR | Status: AC | PRN
  Administered 2019-04-06: 13:00:00 75 mL via INTRAVENOUS

## 2019-04-06 MED ORDER — POTASSIUM CHLORIDE CRYS ER 20 MEQ PO TBCR: 20.0000 meq | EXTENDED_RELEASE_TABLET | Freq: Every day | ORAL | 0 refills | Status: DC

## 2019-04-06 MED ORDER — HEPARIN BOLUS VIA INFUSION
3500.0000 [IU] | Freq: Once | INTRAVENOUS | Status: DC
  Filled 2019-04-06: qty 3500

## 2019-04-06 MED ORDER — HEPARIN (PORCINE) 25000 UT/250ML-% IV SOLN
1350.0000 [IU]/h | INTRAVENOUS | Status: DC
  Administered 2019-04-06: 1350 [IU]/h via INTRAVENOUS
  Filled 2019-04-06: qty 250

## 2019-04-06 MED ORDER — SODIUM CHLORIDE 0.9% IV SOLUTION
Freq: Once | INTRAVENOUS | Status: AC
  Administered 2019-04-07: via INTRAVENOUS

## 2019-04-06 NOTE — Telephone Encounter (Signed)
TCT patient regarding low K+ of 2.9 Spoke with patient and informed him of low K+ and orders for him to take 20 meq KCL daily and to review with Dr. Julien Nordmann when he sees him again on 04/09/19 @ 3:30 pm  Pt voiced understanding

## 2019-04-06 NOTE — H&P (Signed)
History and Physical    Tyler [name] ERD:[id number] DOB: 09-Aug-1960 DOA: 04/06/2019  PCP: Brand Males, MD  Patient coming from: home   Chief Complaint: fatigue  HPI: Tyler [name] is a 58 y.o. male with medical history significant for stage 3a non small cell adenocarcinoma of the lung and malnutrition, who presents with above.  Finished course of chemo with neoadjuvant radiation approximately one month ago. Had CT of chest today to assess response to treatment. PE encountered and patient was referred immediately to the ED.  Patient notes no acute problems. He does endorse at least a month of gradually worsening fatigue, weakness, light headedness, and dyspnea on exertion. No cough. No fevers. No chest pain. No abdominal pain. No vomiting or diarrhea. No hematochezia or melena. No dysuria or hematuria. No history dvt/PE. No new edema. No known covid contacts. Has had blood tranfusions in setting of chemo-induced anemia. Says appetite is poor but has been improving.  ED Course: labs, 1 unit prbcs ordered, heparin ordered  Review of Systems: As per HPI otherwise 10 point review of systems negative.    Past Medical History:  Diagnosis Date   Alcohol abuse    Chronic back pain    COPD (chronic obstructive pulmonary disease) (HCC)    Dyspnea    Lung mass    mediastinal adenopathy   Lung nodule    Multiple pulmonary nodules 04/02/2018   Pneumonia    Right lower lobe pulmonary nodule 04/02/2018   12/01/2017: PET SUV 2.1   Tobacco abuse     Past Surgical History:  Procedure Laterality Date   APPENDECTOMY     VIDEO BRONCHOSCOPY WITH ENDOBRONCHIAL NAVIGATION N/A 04/05/2018   Procedure: VIDEO BRONCHOSCOPY WITH ENDOBRONCHIAL NAVIGATION;  Surgeon: Garner Nash, DO;  Location: [facility];  Service: Thoracic;  Laterality: N/A;   VIDEO BRONCHOSCOPY WITH ENDOBRONCHIAL NAVIGATION N/A 01/11/2019   Procedure: VIDEO BRONCHOSCOPY WITH ENDOBRONCHIAL NAVIGATION;  Surgeon: Garner Nash, DO;  Location: [facility];  Service: Thoracic;  Laterality: N/A;   VIDEO BRONCHOSCOPY WITH ENDOBRONCHIAL ULTRASOUND N/A 01/11/2019   Procedure: VIDEO BRONCHOSCOPY WITH ENDOBRONCHIAL ULTRASOUND;  Surgeon: Garner Nash, DO;  Location: [facility];  Service: Thoracic;  Laterality: N/A;   VIDEO BRONCHOSCOPY WITH RADIAL ENDOBRONCHIAL ULTRASOUND N/A 01/11/2019   Procedure: VIDEO BRONCHOSCOPY WITH RADIAL ENDOBRONCHIAL ULTRASOUND;  Surgeon: Garner Nash, DO;  Location: [facility];  Service: Thoracic;  Laterality: N/A;   WISDOM TOOTH EXTRACTION       reports that he has been smoking cigarettes. He has a 10.00 pack-year smoking history. He has never used smokeless tobacco. He reports previous alcohol use. He reports previous drug use. Drug: Marijuana.  No Known Allergies  Family History  Problem Relation Age of Onset   Heart attack Maternal Grandfather     Prior to Admission medications   Medication Sig Start Date End Date Taking? Authorizing Provider  albuterol (PROVENTIL HFA;VENTOLIN HFA) 108 (90 Base) MCG/ACT inhaler Inhale 2 puffs into the lungs every 6 (six) hours as needed for wheezing or shortness of breath. 06/27/18  Yes Icard, Octavio Graves, DO  feeding supplement, ENSURE ENLIVE, (ENSURE ENLIVE) LIQD Take 237 mLs by mouth 3 (three) times daily between meals. Patient taking differently: Take 237 mLs by mouth daily.  08/11/17  Yes Ghimire, Henreitta Leber, MD  lidocaine (XYLOCAINE) 2 % solution 5 ml swallow before meals for radiation esophagitis 03/30/19  Yes [name], MD  potassium chloride SA (K-DUR) 20 MEQ tablet Take 1 tablet (20 mEq total)  by mouth daily. 04/06/19  Yes Tish Men, MD  prochlorperazine (COMPAZINE) 10 MG tablet Take 1 tablet (10 mg total) by mouth every 6 (six) hours as needed for nausea or vomiting. 01/29/19  Yes [name], MD  sucralfate (CARAFATE) 1 g tablet Take 1 tablet (1 g total) by mouth 4 (four) times daily -  with meals and at bedtime. 02/19/19  Yes  Heilingoetter, Cassandra L, PA-C  tiotropium (SPIRIVA HANDIHALER) 18 MCG inhalation capsule Place 1 capsule (18 mcg total) into inhaler and inhale daily. 06/27/18 01/05/20 Yes Garner Nash, DO    Physical Exam: Vitals:   04/06/19 2130 04/06/19 2200 04/06/19 2230 04/06/19 2315  BP: 105/70 101/62 110/76 107/80  Pulse:   (!) 109 (!) 125  Resp: (!) 32 (!) 28 (!) 29 (!) 29  Temp:      TempSrc:      SpO2:   98% 97%  Weight:      Height:        Constitutional: No acute distress. cachectic Head: Atraumatic Eyes: Conjunctiva clear ENM: Moist mucous membranes. Poor dentition.  Neck: Supple Respiratory: Clear to auscultation bilaterally. Mild tachypnea. Scattered rhonchi Cardiovascular: tachycardic, regular rhythm. Soft systolic murmu Abdomen: Non-tender, non-distended. No masses. No rebound or guarding. Positive bowel sounds. Musculoskeletal: No joint deformity upper and lower extremities. Normal ROM, no contractures. Normal muscle tone.  Skin: No rashes, lesions, or ulcers.  Extremities: mild LE peripheral edema. Palpable peripheral pulses. Neurologic: Alert, moving all 4 extremities. Psychiatric: Normal insight and judgement.   Labs on Admission: I have personally reviewed following labs and imaging studies  CBC: Recent Labs  Lab 04/06/19 1134 04/06/19 2036  WBC 10.7* 9.6  NEUTROABS 7.9*  --   HGB 7.4* 6.8*  HCT 22.2* 21.0*  MCV 77.9* 79.8*  PLT 149* 124   Basic Metabolic Panel: Recent Labs  Lab 04/06/19 1134 04/06/19 2036  NA 136 132*  K 2.9* 3.0*  CL 97* 93*  CO2 30 27  GLUCOSE 61* 91  BUN 7 6  CREATININE 0.54* 0.38*  CALCIUM 7.8* 7.8*   GFR: Estimated Creatinine Clearance: 85 mL/min (A) (by C-G formula based on SCr of 0.38 mg/dL (L)). Liver Function Tests: Recent Labs  Lab 04/06/19 1134  AST 15  ALT 6  ALKPHOS 85  BILITOT 0.8  PROT 6.1*  ALBUMIN 2.2*   No results for input(s): LIPASE, AMYLASE in the last 168 hours. No results for input(s):  AMMONIA in the last 168 hours. Coagulation Profile: Recent Labs  Lab 04/06/19 2217  INR 1.2   Cardiac Enzymes: No results for input(s): CKTOTAL, CKMB, CKMBINDEX, TROPONINI in the last 168 hours. BNP (last 3 results) No results for input(s): PROBNP in the last 8760 hours. HbA1C: No results for input(s): HGBA1C in the last 72 hours. CBG: No results for input(s): GLUCAP in the last 168 hours. Lipid Profile: No results for input(s): CHOL, HDL, LDLCALC, TRIG, CHOLHDL, LDLDIRECT in the last 72 hours. Thyroid Function Tests: No results for input(s): TSH, T4TOTAL, FREET4, T3FREE, THYROIDAB in the last 72 hours. Anemia Panel: No results for input(s): VITAMINB12, FOLATE, FERRITIN, TIBC, IRON, RETICCTPCT in the last 72 hours. Urine analysis:    Component Value Date/Time   COLORURINE YELLOW 08/03/2017 [place] 08/03/2017 1755   LABSPEC 1.019 08/03/2017 1755   PHURINE 5.0 08/03/2017 1755   GLUCOSEU NEGATIVE 08/03/2017 1755   HGBUR MODERATE (A) 08/03/2017 1755   BILIRUBINUR NEGATIVE 08/03/2017 1755   KETONESUR NEGATIVE 08/03/2017 1755   PROTEINUR  100 (A) 08/03/2017 1755   NITRITE NEGATIVE 08/03/2017 1755   LEUKOCYTESUR NEGATIVE 08/03/2017 1755    Radiological Exams on Admission: Dg Chest 2 View  Result Date: 04/06/2019 CLINICAL DATA:  Chest pain. EXAM: CHEST - 2 VIEW COMPARISON:  Radiograph of January 11, 2019. FINDINGS: The heart size and mediastinal contours are within normal limits. No pneumothorax is noted. Left lung is clear. Probable lower lobe atelectasis or pneumonia is noted with small right pleural effusion. The visualized skeletal structures are unremarkable. IMPRESSION: Right lower lobe opacity is noted most consistent with atelectasis or infiltrate with associated pleural effusion. Followup PA and lateral chest X-ray is recommended in 3-4 weeks following trial of antibiotic therapy to ensure resolution and exclude underlying malignancy. Electronically Signed   By:  Marijo Conception M.D.   On: 04/06/2019 20:19   Ct Chest W Contrast  Addendum Date: 04/06/2019   ADDENDUM REPORT: 04/06/2019 16:44 ADDENDUM: Critical Value/emergent results were called by telephone at the time of interpretation on 04/06/2019 at 4:43 pm to the [facility] triage nurse Elmer Picker, who verbally acknowledged these results. Electronically Signed   By: Kerby Moors M.D.   On: 04/06/2019 16:44   Result Date: 04/06/2019 CLINICAL DATA:  Restaging right lung cancer. Right supraclavicular pain and cough. EXAM: CT CHEST WITH CONTRAST TECHNIQUE: Multidetector CT imaging of the chest was performed during intravenous contrast administration. CONTRAST:  33mL OMNIPAQUE IOHEXOL 300 MG/ML  SOLN COMPARISON:  CT chest 01/08/2019 and PET-CT 12/27/2018 FINDINGS: Cardiovascular: Normal heart size. No pericardial effusion. Mild aortic atherosclerosis. RCA and left circumflex coronary artery calcifications. There is a left upper lobe segmental branch pulmonary artery filling defect, image 76/2. This is compatible with acute pulmonary embolus. Additional PE identified within a segmental branch of the left lower lobe pulmonary artery, image 101/2 Mediastinum/Nodes: Normal appearance of the thyroid gland. The trachea appears patent and is midline. Normal appearance of the esophagus. No mediastinal or left hilar adenopathy. 1 cm right hilar lymph node is identified. Increased from 0.7 cm previously. Lungs/Pleura: Centrilobular and paraseptal emphysema. New moderate right pleural effusion and small left pleural effusion. Significant interval increase in tumor burden within the right lower lobe which measures 8.6 x 6.7 by 6.4 cm, image 112/2 and image 52/7. On the sagittal images this has a AP diameter 9.7 cm spanning the right lower lobe and right middle lobe. Adjacent ground-glass attenuation and interlobular septal thickening noted concerning for lymphangitic spread. On the previous exam this  lesion was limited to the central right lower lobe measuring 1.9 x 2.5 by 1.5 cm. Tumor extension into the right infrahilar region with encasement of the right lower lobe and right middle lobe bronchi identified with postobstructive pneumonitis. New tumor within the central right middle lobe measures 4.4 x 2.5 by 5.4 cm, image 117/2 and image 25/6. Solid nodule within the right upper lobe measures 8 mm, image 31/5. Unchanged from previous exam. Thin walled cavitary nodule within the posterior left upper lobe measures 4 mm, image 43/5. Previously this measured 6 mm. Unchanged, ill-defined part solid nodule in the lateral aspect of the superior segment of left lower lobe measuring 1 cm, image 78/5. Triangular-shaped, subpleural nodule in the posterior left lower lobe is new measuring 1.4 cm and may represent an area of pulmonary infarct is this is in the distribution of the left lower lobe pulmonary embolus. Upper Abdomen: No acute abnormality. Musculoskeletal: No acute or aggressive bone lesions identified. IMPRESSION: 1. Considerable increase in tumor burden involving  the left lower lobe and now the right middle lobe. There is overlying ground-glass attenuation and interlobular septal thickening concerning for lymphangitic spread of disease. 2. New bilateral pleural effusions right greater than left. 3. Additional small solid and sub solid nodules involving the right upper lobe and left lung are not significantly changed in the interval. 4. Left upper lobe and left lower lobe segmental branch acute pulmonary artery emboli. Electronically Signed: By: Kerby Moors M.D. On: 04/06/2019 15:47   Ekg: pvcs, normal appearing intervals  Assessment/Plan Principal Problem:   Pulmonary embolism (HCC) Active Problems:   Protein-calorie malnutrition, severe   Tobacco abuse   Adenocarcinoma of right lung, stage 3 (HCC)   Microcytic anemia   Hypokalemia   # pulmonary embolism - left lung. CT doesn't comment on  evidence of right hear strain. Possibly subacute given patient's history. Likely 2/2 malignancy. Hemodynamically stable, normal O2 on room air. h low to 6.8, no signs bleeding, stool guaiac negative. Shared decision with patient after discussing risks/benefits of anticoagulation to proceed w/ anticoagulation - continue heparin, likely transition to lmwh once confident h/h stable and no bleeding  # anemia - microcytic. Chronic, likely 2/2 chemo, has required blood transfusion in past during chemo. Stool guaiac negative, denies recent history of bleeding. - 1 u prbcs ordered - am cbc - check iron panel  # hypokalemia - likely 2/2 decreased PO. Chronic. k 3.0. - 60 meq kcl ordered - f/u Mg - am bmp - tele overnight  # adenocarcinoma of lung - ct unfortunately shows tumor progression - close oncology f/u - cont home spiriva, albuterol prn, compazine prn  # malnutrition - continue home ensure supplements   DVT prophylaxis: heparain therapeutic Code Status: full  Family Communication: wife jacqueline  Disposition Plan: tbd  Consults called: none  Admission status: tele    Desma Maxim MD [facility] Pager [phone]  If 7PM-7AM, please contact night-coverage [email or web address] Password [email or web address]  04/06/2019, 11:18 PM

## 2019-04-06 NOTE — Progress Notes (Signed)
ANTICOAGULATION CONSULT NOTE - Initial Consult  Pharmacy Consult for Heparin Indication: pulmonary embolus  No Known Allergies  Patient Measurements: Height: 6\' 2"  (188 cm) Weight: 135 lb (61.2 kg) IBW/kg (Calculated) : 82.2 Heparin Dosing Weight:   Vital Signs: Temp: 98.5 F (36.9 C) (09/25 1944) Temp Source: Oral (09/25 1944) BP: 110/76 (09/25 2230) Pulse Rate: 109 (09/25 2230)  Labs: Recent Labs    04/06/19 1134 04/06/19 2036  HGB 7.4* 6.8*  HCT 22.2* 21.0*  PLT 149* 151  CREATININE 0.54* 0.38*  TROPONINIHS  --  5    Estimated Creatinine Clearance: 85 mL/min (A) (by C-G formula based on SCr of 0.38 mg/dL (L)).   Medical History: Past Medical History:  Diagnosis Date  . Alcohol abuse   . Chronic back pain   . COPD (chronic obstructive pulmonary disease) ([place])   . Dyspnea   . Lung mass    mediastinal adenopathy  . Lung nodule   . Multiple pulmonary nodules 04/02/2018  . Pneumonia   . Right lower lobe pulmonary nodule 04/02/2018   12/01/2017: PET SUV 2.1  . Tobacco abuse     Medications:  Infusions:  . heparin      Assessment: Patient with new PE.  MD wants heparin per pharmacy without bolus for PE.  Baseline coags ordered.  No oral anticoagulants noted on med rec.   Goal of Therapy:  Heparin level 0.3-0.7 units/ml Monitor platelets by anticoagulation protocol: Yes   Plan:  No heparin bolus--per MD direction Heparin drip at 1350 units/hr CBC daily Heparin level at 0800  [name] 04/06/2019,11:08 PM

## 2019-04-06 NOTE — ED Triage Notes (Signed)
Patient complaining of mid chest pain. Patient was called by primary care physician to come into ED due to possible PE. Patient also has been having trouble breathing.

## 2019-04-06 NOTE — Telephone Encounter (Addendum)
Verbal order received and read back from Dr. Maylon Peppers for patient to report to ED for evaluation. Patient notified of provider orders.  Verbal order received and read back from Dr, Julien Nordmann for patient to go to ED.  Again advised ED. "Tyler [name] I have had some breathing changes or problems.  Will not have a ride until in the morning.  I take my potassium daily."    Advised to ask neighbor, family, friend or call [phone] for timely therapy to prevent risk, dangers of pulmonary embolism. 1642:  Ended call.  "I will go as soon as possible."

## 2019-04-06 NOTE — ED Notes (Signed)
hgb 6.8 MD made aware

## 2019-04-06 NOTE — Telephone Encounter (Signed)
CRITICAL VALUE STICKER  CRITICAL VALUE: K+ = 2.9.  RECEIVER (on-site recipient of call): Mining engineer, Triage [place].   DATE & TIME NOTIFIED: 04/06/2019 at 1216.   MESSENGER (representative from lab): Corbin Ade [facility].  MD NOTIFIED: Collaborative nurse.  TIME OF NOTIFICATION: 04/06/2019 at 1222.  RESPONSE: None.

## 2019-04-06 NOTE — Telephone Encounter (Signed)
Call report received from Dr. Clovis Riley regarding today's CT Chest.  Patient with acute P.E.  IMPRESSION: 1. Considerable increase in tumor burden involving the left lower lobe and now the right middle lobe. There is overlying ground-glass attenuation and interlobular septal thickening concerning for lymphangitic spread of disease. 2. New bilateral pleural effusions right greater than left. 3. Additional small solid and sub solid nodules involving the right upper lobe and left lung are not significantly changed in the interval. 4. Left upper lobe and left lower lobe segmental branch acute pulmonary artery emboli.

## 2019-04-07 ENCOUNTER — Inpatient Hospital Stay (HOSPITAL_COMMUNITY): Payer: Medicaid Other

## 2019-04-07 ENCOUNTER — Inpatient Hospital Stay (HOSPITAL_COMMUNITY): Payer: Self-pay

## 2019-04-07 DIAGNOSIS — Z720 Tobacco use: Secondary | ICD-10-CM | POA: Diagnosis not present

## 2019-04-07 DIAGNOSIS — M549 Dorsalgia, unspecified: Secondary | ICD-10-CM | POA: Diagnosis present

## 2019-04-07 DIAGNOSIS — D509 Iron deficiency anemia, unspecified: Secondary | ICD-10-CM | POA: Diagnosis present

## 2019-04-07 DIAGNOSIS — R091 Pleurisy: Secondary | ICD-10-CM | POA: Diagnosis not present

## 2019-04-07 DIAGNOSIS — F17210 Nicotine dependence, cigarettes, uncomplicated: Secondary | ICD-10-CM | POA: Diagnosis present

## 2019-04-07 DIAGNOSIS — J90 Pleural effusion, not elsewhere classified: Secondary | ICD-10-CM | POA: Diagnosis not present

## 2019-04-07 DIAGNOSIS — J449 Chronic obstructive pulmonary disease, unspecified: Secondary | ICD-10-CM | POA: Diagnosis present

## 2019-04-07 DIAGNOSIS — Z9889 Other specified postprocedural states: Secondary | ICD-10-CM | POA: Diagnosis present

## 2019-04-07 DIAGNOSIS — E43 Unspecified severe protein-calorie malnutrition: Secondary | ICD-10-CM | POA: Diagnosis not present

## 2019-04-07 DIAGNOSIS — I2699 Other pulmonary embolism without acute cor pulmonale: Secondary | ICD-10-CM | POA: Diagnosis not present

## 2019-04-07 DIAGNOSIS — I82403 Acute embolism and thrombosis of unspecified deep veins of lower extremity, bilateral: Secondary | ICD-10-CM | POA: Diagnosis not present

## 2019-04-07 DIAGNOSIS — D649 Anemia, unspecified: Secondary | ICD-10-CM | POA: Diagnosis not present

## 2019-04-07 DIAGNOSIS — Z79899 Other long term (current) drug therapy: Secondary | ICD-10-CM | POA: Diagnosis not present

## 2019-04-07 DIAGNOSIS — Z23 Encounter for immunization: Secondary | ICD-10-CM | POA: Diagnosis not present

## 2019-04-07 DIAGNOSIS — R079 Chest pain, unspecified: Secondary | ICD-10-CM | POA: Diagnosis not present

## 2019-04-07 DIAGNOSIS — I82412 Acute embolism and thrombosis of left femoral vein: Secondary | ICD-10-CM | POA: Diagnosis present

## 2019-04-07 DIAGNOSIS — I340 Nonrheumatic mitral (valve) insufficiency: Secondary | ICD-10-CM | POA: Diagnosis not present

## 2019-04-07 DIAGNOSIS — C3491 Malignant neoplasm of unspecified part of right bronchus or lung: Secondary | ICD-10-CM | POA: Diagnosis not present

## 2019-04-07 DIAGNOSIS — C3490 Malignant neoplasm of unspecified part of unspecified bronchus or lung: Secondary | ICD-10-CM | POA: Diagnosis not present

## 2019-04-07 DIAGNOSIS — I82443 Acute embolism and thrombosis of tibial vein, bilateral: Secondary | ICD-10-CM | POA: Diagnosis present

## 2019-04-07 DIAGNOSIS — Z20828 Contact with and (suspected) exposure to other viral communicable diseases: Secondary | ICD-10-CM | POA: Diagnosis present

## 2019-04-07 DIAGNOSIS — Z8701 Personal history of pneumonia (recurrent): Secondary | ICD-10-CM | POA: Diagnosis not present

## 2019-04-07 DIAGNOSIS — I82433 Acute embolism and thrombosis of popliteal vein, bilateral: Secondary | ICD-10-CM | POA: Diagnosis present

## 2019-04-07 DIAGNOSIS — F1010 Alcohol abuse, uncomplicated: Secondary | ICD-10-CM | POA: Diagnosis present

## 2019-04-07 DIAGNOSIS — E876 Hypokalemia: Secondary | ICD-10-CM | POA: Diagnosis not present

## 2019-04-07 DIAGNOSIS — K08409 Partial loss of teeth, unspecified cause, unspecified class: Secondary | ICD-10-CM | POA: Diagnosis present

## 2019-04-07 DIAGNOSIS — Z681 Body mass index (BMI) 19 or less, adult: Secondary | ICD-10-CM | POA: Diagnosis not present

## 2019-04-07 DIAGNOSIS — I361 Nonrheumatic tricuspid (valve) insufficiency: Secondary | ICD-10-CM | POA: Diagnosis not present

## 2019-04-07 DIAGNOSIS — G8929 Other chronic pain: Secondary | ICD-10-CM | POA: Diagnosis present

## 2019-04-07 DIAGNOSIS — R000 Tachycardia, unspecified: Secondary | ICD-10-CM | POA: Diagnosis not present

## 2019-04-07 LAB — BASIC METABOLIC PANEL
Anion gap: 9 (ref 5–15)
BUN: 7 mg/dL (ref 6–20)
CO2: 28 mmol/L (ref 22–32)
Calcium: 7.4 mg/dL — ABNORMAL LOW (ref 8.9–10.3)
Chloride: 95 mmol/L — ABNORMAL LOW (ref 98–111)
Creatinine, Ser: <0.3 mg/dL — ABNORMAL LOW (ref 0.61–1.24)
Glucose, Bld: 100 mg/dL — ABNORMAL HIGH (ref 70–99)
Potassium: 3.2 mmol/L — ABNORMAL LOW (ref 3.5–5.1)
Sodium: 132 mmol/L — ABNORMAL LOW (ref 135–145)

## 2019-04-07 LAB — BODY FLUID CELL COUNT WITH DIFFERENTIAL
Eos, Fluid: 0 %
Lymphs, Fluid: 10 %
Monocyte-Macrophage-Serous Fluid: 6 % — ABNORMAL LOW (ref 50–90)
Neutrophil Count, Fluid: 84 % — ABNORMAL HIGH (ref 0–25)
Total Nucleated Cell Count, Fluid: 1902 cu mm — ABNORMAL HIGH (ref 0–1000)

## 2019-04-07 LAB — IRON AND TIBC
Iron: 68 ug/dL (ref 45–182)
Saturation Ratios: 34 % (ref 17.9–39.5)
TIBC: 199 ug/dL — ABNORMAL LOW (ref 250–450)
UIBC: 131 ug/dL

## 2019-04-07 LAB — CBC
HCT: 24.8 % — ABNORMAL LOW (ref 39.0–52.0)
Hemoglobin: 8.4 g/dL — ABNORMAL LOW (ref 13.0–17.0)
MCH: 27 pg (ref 26.0–34.0)
MCHC: 33.9 g/dL (ref 30.0–36.0)
MCV: 79.7 fL — ABNORMAL LOW (ref 80.0–100.0)
Platelets: 135 10*3/uL — ABNORMAL LOW (ref 150–400)
RBC: 3.11 MIL/uL — ABNORMAL LOW (ref 4.22–5.81)
RDW: 21.9 % — ABNORMAL HIGH (ref 11.5–15.5)
WBC: 9.7 10*3/uL (ref 4.0–10.5)
nRBC: 0 % (ref 0.0–0.2)

## 2019-04-07 LAB — PROTEIN, PLEURAL OR PERITONEAL FLUID: Total protein, fluid: <3 g/dL

## 2019-04-07 LAB — ECHOCARDIOGRAM COMPLETE
Height: 74 in
Weight: 2160 oz

## 2019-04-07 LAB — SARS CORONAVIRUS 2 (TAT 6-24 HRS): SARS Coronavirus 2: NEGATIVE

## 2019-04-07 LAB — LACTATE DEHYDROGENASE, PLEURAL OR PERITONEAL FLUID: LD, Fluid: 113 U/L — ABNORMAL HIGH (ref 3–23)

## 2019-04-07 LAB — GLUCOSE, PLEURAL OR PERITONEAL FLUID: Glucose, Fluid: 109 mg/dL

## 2019-04-07 LAB — FERRITIN: Ferritin: 1026 ng/mL — ABNORMAL HIGH (ref 24–336)

## 2019-04-07 LAB — MAGNESIUM: Magnesium: 1.3 mg/dL — ABNORMAL LOW (ref 1.7–2.4)

## 2019-04-07 LAB — ABO/RH: ABO/RH(D): B POS

## 2019-04-07 LAB — HEPARIN LEVEL (UNFRACTIONATED)
Heparin Unfractionated: 0.23 IU/mL — ABNORMAL LOW (ref 0.30–0.70)
Heparin Unfractionated: 0.19 IU/mL — ABNORMAL LOW (ref 0.30–0.70)

## 2019-04-07 MED ORDER — ALBUTEROL SULFATE (2.5 MG/3ML) 0.083% IN NEBU: 2.5000 mg | INHALATION_SOLUTION | Freq: Four times a day (QID) | RESPIRATORY_TRACT | Status: DC | PRN

## 2019-04-07 MED ORDER — LIDOCAINE HCL 1 % IJ SOLN
INTRAMUSCULAR | Status: AC
  Filled 2019-04-07: qty 20

## 2019-04-07 MED ORDER — MAGNESIUM SULFATE 4 GM/100ML IV SOLN
4.0000 g | Freq: Once | INTRAVENOUS | Status: AC
  Administered 2019-04-07: 09:00:00 4 g via INTRAVENOUS
  Filled 2019-04-07: qty 100

## 2019-04-07 MED ORDER — MAGNESIUM SULFATE 50 % IJ SOLN: 4.0000 g | Freq: Once | INTRAVENOUS | Status: DC

## 2019-04-07 MED ORDER — SODIUM CHLORIDE 0.9% FLUSH
3.0000 mL | Freq: Two times a day (BID) | INTRAVENOUS | Status: DC
  Administered 2019-04-07 – 2019-04-09 (×5): 3 mL via INTRAVENOUS

## 2019-04-07 MED ORDER — PROCHLORPERAZINE MALEATE 10 MG PO TABS
10.0000 mg | ORAL_TABLET | Freq: Four times a day (QID) | ORAL | Status: DC | PRN
  Administered 2019-04-07: 09:00:00 10 mg via ORAL
  Filled 2019-04-07 (×2): qty 1

## 2019-04-07 MED ORDER — SUCRALFATE 1 G PO TABS
1.0000 g | ORAL_TABLET | Freq: Three times a day (TID) | ORAL | Status: DC
  Administered 2019-04-07 – 2019-04-10 (×13): 1 g via ORAL
  Filled 2019-04-07 (×13): qty 1

## 2019-04-07 MED ORDER — ACETAMINOPHEN 325 MG PO TABS
650.0000 mg | ORAL_TABLET | Freq: Four times a day (QID) | ORAL | Status: DC | PRN
  Administered 2019-04-07 – 2019-04-10 (×8): 650 mg via ORAL
  Filled 2019-04-07 (×8): qty 2

## 2019-04-07 MED ORDER — UMECLIDINIUM BROMIDE 62.5 MCG/INH IN AEPB
1.0000 | INHALATION_SPRAY | Freq: Every day | RESPIRATORY_TRACT | Status: DC
  Filled 2019-04-07 (×2): qty 7

## 2019-04-07 MED ORDER — SODIUM CHLORIDE 0.9% FLUSH: 3.0000 mL | INTRAVENOUS | Status: DC | PRN

## 2019-04-07 MED ORDER — ENSURE ENLIVE PO LIQD
237.0000 mL | Freq: Every day | ORAL | Status: DC
  Administered 2019-04-07 – 2019-04-08 (×2): 237 mL via ORAL
  Filled 2019-04-07: qty 237

## 2019-04-07 MED ORDER — POTASSIUM CHLORIDE CRYS ER 20 MEQ PO TBCR
60.0000 meq | EXTENDED_RELEASE_TABLET | Freq: Two times a day (BID) | ORAL | Status: DC
  Administered 2019-04-07: 04:00:00 60 meq via ORAL
  Filled 2019-04-07: qty 3

## 2019-04-07 MED ORDER — SODIUM CHLORIDE 0.9 % IV SOLN
250.0000 mL | INTRAVENOUS | Status: DC | PRN
  Administered 2019-04-08: 14:00:00 250 mL via INTRAVENOUS

## 2019-04-07 MED ORDER — POTASSIUM CHLORIDE CRYS ER 20 MEQ PO TBCR
40.0000 meq | EXTENDED_RELEASE_TABLET | Freq: Every day | ORAL | Status: DC
  Administered 2019-04-07 – 2019-04-10 (×4): 40 meq via ORAL
  Filled 2019-04-07 (×4): qty 2

## 2019-04-07 MED ORDER — ALBUTEROL SULFATE HFA 108 (90 BASE) MCG/ACT IN AERS
2.0000 | INHALATION_SPRAY | Freq: Four times a day (QID) | RESPIRATORY_TRACT | Status: DC | PRN
  Filled 2019-04-07: qty 6.7

## 2019-04-07 MED ORDER — HEPARIN (PORCINE) 25000 UT/250ML-% IV SOLN
1650.0000 [IU]/h | INTRAVENOUS | Status: DC
  Administered 2019-04-08 – 2019-04-09 (×2): 1650 [IU]/h via INTRAVENOUS
  Filled 2019-04-07 (×2): qty 250

## 2019-04-07 MED ORDER — PHENOL 1.4 % MT LIQD
1.0000 | OROMUCOSAL | Status: DC | PRN
  Filled 2019-04-07: qty 177

## 2019-04-07 MED ORDER — HEPARIN (PORCINE) 25000 UT/250ML-% IV SOLN
1500.0000 [IU]/h | INTRAVENOUS | Status: DC
  Filled 2019-04-07: qty 250

## 2019-04-07 MED ORDER — INFLUENZA VAC SPLIT QUAD 0.5 ML IM SUSY
0.5000 mL | PREFILLED_SYRINGE | INTRAMUSCULAR | Status: AC
  Administered 2019-04-10: 0.5 mL via INTRAMUSCULAR
  Filled 2019-04-07: qty 0.5

## 2019-04-07 NOTE — ED Notes (Signed)
RN has called lab and requested that Heparin Level be run urgently due to lab being drawn 2 hours late and patient being on Heparin Drip without drip being stopped.

## 2019-04-07 NOTE — ED Notes (Signed)
Pt provided with fresh warm blankets and a new urinal. Pt also given a fresh cup of gingerale.

## 2019-04-07 NOTE — Evaluation (Signed)
Clinical/Bedside Swallow Evaluation Patient Details  Name: Tyler [name] MRN: [id number] Date of Birth: [birth year]  Today's Date: 04/07/2019 Time: SLP Start Time (ACUTE ONLY): 0347 SLP Stop Time (ACUTE ONLY): 1200 SLP Time Calculation (min) (ACUTE ONLY): 15 min  Past Medical History:  Past Medical History:  Diagnosis Date  . Alcohol abuse   . Chronic back pain   . COPD (chronic obstructive pulmonary disease) ([place])   . Dyspnea   . Lung mass    mediastinal adenopathy  . Lung nodule   . Multiple pulmonary nodules 04/02/2018  . Pneumonia   . Right lower lobe pulmonary nodule 04/02/2018   12/01/2017: PET SUV 2.1  . Tobacco abuse    Past Surgical History:  Past Surgical History:  Procedure Laterality Date  . APPENDECTOMY    . VIDEO BRONCHOSCOPY WITH ENDOBRONCHIAL NAVIGATION N/A 04/05/2018   Procedure: VIDEO BRONCHOSCOPY WITH ENDOBRONCHIAL NAVIGATION;  Surgeon: Garner Nash, DO;  Location: [place];  Service: Thoracic;  Laterality: N/A;  . VIDEO BRONCHOSCOPY WITH ENDOBRONCHIAL NAVIGATION N/A 01/11/2019   Procedure: VIDEO BRONCHOSCOPY WITH ENDOBRONCHIAL NAVIGATION;  Surgeon: Garner Nash, DO;  Location: [place];  Service: Thoracic;  Laterality: N/A;  . VIDEO BRONCHOSCOPY WITH ENDOBRONCHIAL ULTRASOUND N/A 01/11/2019   Procedure: VIDEO BRONCHOSCOPY WITH ENDOBRONCHIAL ULTRASOUND;  Surgeon: Garner Nash, DO;  Location: [place];  Service: Thoracic;  Laterality: N/A;  . VIDEO BRONCHOSCOPY WITH RADIAL ENDOBRONCHIAL ULTRASOUND N/A 01/11/2019   Procedure: VIDEO BRONCHOSCOPY WITH RADIAL ENDOBRONCHIAL ULTRASOUND;  Surgeon: Garner Nash, DO;  [facility];  Service: Thoracic;  Laterality: N/A;  . WISDOM TOOTH EXTRACTION     HPI:  Patient is a 58 y.o. 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 He was getting CT  chest to assess response to treatment and PE was encountered, resulting in him being admitted to hospital.   Assessment / Plan / Recommendation Clinical Impression  Patient presents with an oropharyngeal swallow that is [facility] and without overt s/s of aspiration or penetration, however he endorses discomfort and pain with oral and pharyngeal swallow of foods that are not soft enough.This is related to his recent chemotherapy and he showed SLP a bottle of lidocaine rinse that he says he uses before he eats. He would benefit from at least one follow-up from SLP. SLP Visit Diagnosis: Dysphagia, unspecified (R13.10)    Aspiration Risk  Mild aspiration risk    Diet Recommendation Thin liquid;Regular   Liquid Administration via: Cup;Straw Medication Administration: Whole meds with liquid Supervision: Patient able to self feed Postural Changes: Seated upright at 90 degrees    Other  Recommendations Oral Care Recommendations: Oral care BID;Patient independent with oral care   Follow up Recommendations None      Frequency and Duration min 1 x/week  1 week       Prognosis   Good     Swallow Study   General Date of Onset: 04/07/19 HPI: Patient is a 58 y.o. 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 He was getting CT chest to assess response to treatment and PE was encountered, resulting in him being admitted to hospital. Type of Study: Bedside Swallow Evaluation Previous Swallow Assessment: N/A Diet Prior to this Study: Regular;Thin  liquids Temperature Spikes Noted: No Respiratory Status: Room air History of Recent Intubation: No Behavior/Cognition: Alert;Cooperative Oral Cavity Assessment: Within Functional Limits Oral Care Completed by SLP: No Oral Cavity - Dentition: Adequate natural dentition Vision:  Functional for self-feeding Self-Feeding Abilities: Able to feed self Patient Positioning: Upright in bed Baseline Vocal Quality: Normal Volitional Cough: Strong Volitional Swallow: Able to elicit    Oral/Motor/Sensory Function Overall Oral Motor/Sensory Function: Within functional limits   Ice Chips     Thin Liquid Thin Liquid: Within functional limits Presentation: Self Fed;Straw Other Comments: No overt s/s of aspiration or penetration with thin liquids    Nectar Thick     Honey Thick     Puree Puree: Within functional limits Presentation: Self Fed   Solid     Solid: Impaired Pharyngeal Phase Impairments: Other (comments) Other Comments: patient reports not being able to eat foods unless they are soft as it causes pain in throat when swallowing.      Tyler [name] 04/07/2019,2:52 PM   Tyler Baller, MA, [facility] Pager: [phone]

## 2019-04-07 NOTE — ED Notes (Signed)
Patient is using upper left and right extremities to eat without complication or assistance.

## 2019-04-07 NOTE — ED Notes (Signed)
Patient continues to bend arm after patient has been informed to keep arm straight. Patient does not seem to understand or follow directions.

## 2019-04-07 NOTE — ED Notes (Signed)
Patient transported to [place] for Thoracentesis.

## 2019-04-07 NOTE — ED Notes (Signed)
Patient was given breakfast tray.

## 2019-04-07 NOTE — Progress Notes (Addendum)
ANTICOAGULATION CONSULT NOTE  Pharmacy Consult for Heparin Indication: pulmonary embolus  No Known Allergies  Patient Measurements: Height: 6\' 2"  (188 cm) Weight: 135 lb (61.2 kg) IBW/kg (Calculated) : 82.2 Heparin Dosing Weight: actual body weight   Vital Signs: Temp: 98.6 F (37 C) (09/26 0352) Temp Source: Oral (09/26 0352) BP: 115/76 (09/26 1030) Pulse Rate: 97 (09/26 1030)  Labs: Recent Labs    04/06/19 1134 04/06/19 2036 04/06/19 2217 04/07/19 0628 04/07/19 0839  HGB 7.4* 6.8*  --  8.4*  --   HCT 22.2* 21.0*  --  24.8*  --   PLT 149* 151  --  135*  --   APTT  --   --  34  --   --   LABPROT  --   --  15.1  --   --   INR  --   --  1.2  --   --   HEPARINUNFRC  --   --   --   --  0.23*  CREATININE 0.54* 0.38*  --  <0.30*  --   TROPONINIHS  --  5  --   --   --     CrCl cannot be calculated (This lab value cannot be used to calculate CrCl because it is not a number: <0.30).   Medical History: Past Medical History:  Diagnosis Date  . Alcohol abuse   . Chronic back pain   . COPD (chronic obstructive pulmonary disease) ([place])   . Dyspnea   . Lung mass    mediastinal adenopathy  . Lung nodule   . Multiple pulmonary nodules 04/02/2018  . Pneumonia   . Right lower lobe pulmonary nodule 04/02/2018   12/01/2017: PET SUV 2.1  . Tobacco abuse     Assessment: 58 year old male with PMH of adenocarcinoma of the lung admitted with new PE. Pharmacy consulted for IV heparin dosing. No bolus per MD orders. Baseline coags WNL. Patient not on any anticoagulants PTA.    Today, 04/07/19:  First heparin level = 0.23 units/mL, subtherapeutic  CBC: Hgb 6.8 > 8.4 after transfusion of 1 unit PRBCs, Pltc decreased to 135K  Per discussion with RN, patient bends arm intermittently where heparin is infusing. Patient educated by RN to keep arm straight.   No bleeding issues reported per nursing.   Goal of Therapy:  Heparin level 0.3-0.7 units/ml Monitor platelets by  anticoagulation protocol: Yes   Plan:  Increase heparin infusion to 1500 units/hr Heparin level 6 hours after rate change Daily CBC, heparin level Monitor closely for s/sx of bleeding   Lindell Spar, PharmD, BCPS Clinical Pharmacist  04/07/2019,11:03 AM     Addendum:   PM heparin level = 0.19 units/mL, subtherapeutic and decreased despite increasing rate earlier today  Per discussion with RN, heparin infusion was not stopped for thoracentesis. Confirmed heparin is running at specified rate.   No bleeding issues per nursing   Plan:  Increase heparin infusion to 1650 units/hr  RN to re-assess IV site and will either move or wrap to prevent patient from bending arm where heparin is infusing  Heparin level 6 hours after rate change  Daily CBC, heparin level  Monitor closely for s/sx of bleeding   Lindell Spar, PharmD, BCPS Clinical Pharmacist  04/07/2019 8:08 PM

## 2019-04-07 NOTE — ED Notes (Signed)
Abby RN, the accepting RN is also currently the Charge RN and is trying to give report and assignments / staffing and asked that this patient be brought up after 1930. ED Charge RN made aware.

## 2019-04-07 NOTE — ED Notes (Signed)
Placed on O2 2L/[place]

## 2019-04-07 NOTE — ED Notes (Signed)
Vascular has just informed RN that due to having an overwhelming number of Vascular Studies, patient Vascular study will be completed first thing tomorrow and also due to Vascular Study being labeled as Routine.

## 2019-04-07 NOTE — ED Provider Notes (Signed)
[facility] DEPT Provider Note   CSN: [id number] Arrival date & time: 04/06/19  1938     History   Chief Complaint Chief Complaint  Patient presents with   Chest Pain    HPI Tyler [name] is a 58 y.o. male.  Presents to ER with shortness of breath and abnormal CT scan. stage IIIa (T1b, and 2, M0) non-small cell lung cancer, adenocarcinoma diagnosed in July 2020. Has undergone chemo. Oncologist had scheduled repeat chest imaging today. CT scan showed increase tumor burden, b/l pleural effusions, as well as acute pulmonary emboli. Patient states he has been having progressive shortness of breath and fatigue over the past weeks to months, cannot identifiy one specific time. Currently not having chest pain. Dyspnea significantly worsened with exertion. States he feels very fatigued, no energy. Denies prior history of DVT/PE, not currently on [facility].  Denies hemoptysis, denies blood in stools, dark stools, or prior history of GI bleed.      HPI  Past Medical History:  Diagnosis Date   Alcohol abuse    Chronic back pain    COPD (chronic obstructive pulmonary disease) (HCC)    Dyspnea    Lung mass    mediastinal adenopathy   Lung nodule    Multiple pulmonary nodules 04/02/2018   Pneumonia    Right lower lobe pulmonary nodule 04/02/2018   12/01/2017: PET SUV 2.1   Tobacco abuse     Patient Active Problem List   Diagnosis Date Noted   Pulmonary embolism ([place]) 04/06/2019   Microcytic anemia 04/06/2019   Hypokalemia 04/06/2019   Malignant neoplasm of bronchus of right lower lobe ([place]) 03/01/2019   Adenocarcinoma of right lung, stage 3 ([place]) 01/17/2019   Goals of care, counseling/discussion 01/17/2019   Encounter for antineoplastic chemotherapy 01/17/2019   Tobacco abuse 01/10/2019   Mediastinal adenopathy 01/10/2019   Right upper lobe pulmonary nodule 04/19/2018   Multiple pulmonary nodules 04/02/2018   Right lower lobe  pulmonary nodule 04/02/2018   Protein-calorie malnutrition, severe 08/09/2017   Acute lung injury    Pneumonitis    BOOP (bronchiolitis obliterans with organizing pneumonia) ([place])    Acute respiratory failure with hypoxia ([place]) 08/08/2017   Hypoxia 08/07/2017   Dehydration    Diarrhea    CAP (community acquired pneumonia) 08/04/2017   Hyponatremia 08/04/2017   Tachycardia 08/04/2017   Influenza A     Past Surgical History:  Procedure Laterality Date   APPENDECTOMY     VIDEO BRONCHOSCOPY WITH ENDOBRONCHIAL NAVIGATION N/A 04/05/2018   Procedure: VIDEO BRONCHOSCOPY WITH ENDOBRONCHIAL NAVIGATION;  Surgeon: Garner Nash, DO;  Location: [place];  Service: Thoracic;  Laterality: N/A;   VIDEO BRONCHOSCOPY WITH ENDOBRONCHIAL NAVIGATION N/A 01/11/2019   Procedure: VIDEO BRONCHOSCOPY WITH ENDOBRONCHIAL NAVIGATION;  Surgeon: Garner Nash, DO;  Location: [place];  Service: Thoracic;  Laterality: N/A;   VIDEO BRONCHOSCOPY WITH ENDOBRONCHIAL ULTRASOUND N/A 01/11/2019   Procedure: VIDEO BRONCHOSCOPY WITH ENDOBRONCHIAL ULTRASOUND;  Surgeon: Garner Nash, DO;  Location: [place];  Service: Thoracic;  Laterality: N/A;   VIDEO BRONCHOSCOPY WITH RADIAL ENDOBRONCHIAL ULTRASOUND N/A 01/11/2019   Procedure: VIDEO BRONCHOSCOPY WITH RADIAL ENDOBRONCHIAL ULTRASOUND;  Surgeon: Garner Nash, DO;  [facility]: [facility];  Service: Thoracic;  Laterality: N/A;   WISDOM TOOTH EXTRACTION          Home Medications    Prior to Admission medications   Medication Sig Start Date End Date Taking? Authorizing Provider  albuterol (PROVENTIL HFA;VENTOLIN HFA) 108 (90 Base) MCG/ACT  inhaler Inhale 2 puffs into the lungs every 6 (six) hours as needed for wheezing or shortness of breath. 06/27/18  Yes Icard, Octavio Graves, DO  feeding supplement, ENSURE ENLIVE, (ENSURE ENLIVE) LIQD Take 237 mLs by mouth 3 (three) times daily between meals. Patient taking differently: Take 237 mLs by mouth daily.  08/11/17  Yes  Ghimire, Henreitta Leber, MD  lidocaine (XYLOCAINE) 2 % solution 5 ml swallow before meals for radiation esophagitis 03/30/19  Yes [name], MD  potassium chloride SA (K-DUR) 20 MEQ tablet Take 1 tablet (20 mEq total) by mouth daily. 04/06/19  Yes Tish Men, MD  prochlorperazine (COMPAZINE) 10 MG tablet Take 1 tablet (10 mg total) by mouth every 6 (six) hours as needed for nausea or vomiting. 01/29/19  Yes [name], MD  sucralfate (CARAFATE) 1 g tablet Take 1 tablet (1 g total) by mouth 4 (four) times daily -  with meals and at bedtime. 02/19/19  Yes Heilingoetter, Cassandra L, PA-C  tiotropium (SPIRIVA HANDIHALER) 18 MCG inhalation capsule Place 1 capsule (18 mcg total) into inhaler and inhale daily. 06/27/18 01/05/20 Yes Icard, Octavio Graves, DO    Family History Family History  Problem Relation Age of Onset   Heart attack Maternal Grandfather     Social History Social History   Tobacco Use   Smoking status: Current Every Day Smoker    Packs/day: 0.25    Years: 40.00    Pack years: 10.00    Types: Cigarettes   Smokeless tobacco: Never Used  Substance Use Topics   Alcohol use: Not Currently   Drug use: Not Currently    Types: Marijuana     Allergies   Patient has no known allergies.   Review of Systems Review of Systems  Constitutional: Positive for fatigue. Negative for chills and fever.  HENT: Negative for ear pain and sore throat.   Eyes: Negative for pain and visual disturbance.  Respiratory: Positive for shortness of breath. Negative for cough.   Cardiovascular: Negative for chest pain and palpitations.  Gastrointestinal: Negative for abdominal pain and vomiting.  Genitourinary: Negative for dysuria and hematuria.  Musculoskeletal: Negative for arthralgias and back pain.  Skin: Negative for color change and rash.  Neurological: Negative for seizures and syncope.  All other systems reviewed and are negative.    Physical Exam Updated Vital Signs BP  109/72 (BP Location: Left Arm)    Pulse (!) 119    Temp 98.6 F (37 C) (Oral)    Resp (!) 26    Ht 6\' 2"  (1.88 m)    Wt 61.2 kg    SpO2 96%    BMI 17.33 kg/m   Physical Exam Vitals signs and nursing note reviewed.  Constitutional:      General: He is not in acute distress.    Comments: chronically ill appearing, no acute distress  HENT:     Head: Normocephalic and atraumatic.  Eyes:     Conjunctiva/sclera: Conjunctivae normal.  Neck:     Musculoskeletal: Neck supple.  Cardiovascular:     Rate and Rhythm: Regular rhythm. Tachycardia present.     Heart sounds: Normal heart sounds. No murmur.  Pulmonary:     Effort: Pulmonary effort is normal. No respiratory distress.     Breath sounds: Normal breath sounds.  Abdominal:     Palpations: Abdomen is soft.     Tenderness: There is no abdominal tenderness.  Genitourinary:    Rectum: Guaiac result negative.     Comments: Brown stool  on rectal exam  Musculoskeletal:     Right lower leg: No edema.     Left lower leg: No edema.  Skin:    General: Skin is warm and dry.     Capillary Refill: Capillary refill takes less than 2 seconds.  Neurological:     General: No focal deficit present.     Mental Status: He is oriented to person, place, and time.  Psychiatric:        Mood and Affect: Mood normal.        Behavior: Behavior normal.   RN chaperoned rectal exam   ED Treatments / Results  Labs (all labs ordered are listed, but only abnormal results are displayed) Labs Reviewed  BASIC METABOLIC PANEL - Abnormal; Notable for the following components:      Result Value   Sodium 132 (*)    Potassium 3.0 (*)    Chloride 93 (*)    Creatinine, Ser 0.38 (*)    Calcium 7.8 (*)    All other components within normal limits  CBC - Abnormal; Notable for the following components:   RBC 2.63 (*)    Hemoglobin 6.8 (*)    HCT 21.0 (*)    MCV 79.8 (*)    MCH 25.9 (*)    RDW 22.5 (*)    All other components within normal limits  BRAIN  NATRIURETIC PEPTIDE - Abnormal; Notable for the following components:   B Natriuretic Peptide 251.3 (*)    All other components within normal limits  MAGNESIUM - Abnormal; Notable for the following components:   Magnesium 1.3 (*)    All other components within normal limits  SARS CORONAVIRUS 2 (TAT 6-24 HRS)  PROTIME-INR  APTT  HEPARIN LEVEL (UNFRACTIONATED)  CBC  POC OCCULT BLOOD, ED  TYPE AND SCREEN  PREPARE RBC (CROSSMATCH)  ABO/RH  TROPONIN I (HIGH SENSITIVITY)  TROPONIN I (HIGH SENSITIVITY)    EKG EKG Interpretation  Date/Time:  Friday April 06 2019 19:57:43 EDT Ventricular Rate:  110 PR Interval:    QRS Duration: 77 QT Interval:  353 QTC Calculation: 478 R Axis:   75 Text Interpretation:  Sinus tachycardia No ST segment changes, no new T wave inversions No STEMI Confirmed by Madalyn Rob [phone]) on 04/07/2019 1:42:15 AM   Radiology Dg Chest 2 View  Result Date: 04/06/2019 CLINICAL DATA:  Chest pain. EXAM: CHEST - 2 VIEW COMPARISON:  Radiograph of January 11, 2019. FINDINGS: The heart size and mediastinal contours are within normal limits. No pneumothorax is noted. Left lung is clear. Probable lower lobe atelectasis or pneumonia is noted with small right pleural effusion. The visualized skeletal structures are unremarkable. IMPRESSION: Right lower lobe opacity is noted most consistent with atelectasis or infiltrate with associated pleural effusion. Followup PA and lateral chest X-ray is recommended in 3-4 weeks following trial of antibiotic therapy to ensure resolution and exclude underlying malignancy. Electronically Signed   By: Marijo Conception M.D.   On: 04/06/2019 20:19   Ct Chest W Contrast  Addendum Date: 04/06/2019   ADDENDUM REPORT: 04/06/2019 16:44 ADDENDUM: Critical Value/emergent results were called by telephone at the time of interpretation on 04/06/2019 at 4:43 pm to the [facility] triage nurse Elmer Picker, who verbally  acknowledged these results. Electronically Signed   By: Kerby Moors M.D.   On: 04/06/2019 16:44   Result Date: 04/06/2019 CLINICAL DATA:  Restaging right lung cancer. Right supraclavicular pain and cough. EXAM: CT CHEST WITH CONTRAST TECHNIQUE: Multidetector CT  imaging of the chest was performed during intravenous contrast administration. CONTRAST:  10mL OMNIPAQUE IOHEXOL 300 MG/ML  SOLN COMPARISON:  CT chest 01/08/2019 and PET-CT 12/27/2018 FINDINGS: Cardiovascular: Normal heart size. No pericardial effusion. Mild aortic atherosclerosis. RCA and left circumflex coronary artery calcifications. There is a left upper lobe segmental branch pulmonary artery filling defect, image 76/2. This is compatible with acute pulmonary embolus. Additional PE identified within a segmental branch of the left lower lobe pulmonary artery, image 101/2 Mediastinum/Nodes: Normal appearance of the thyroid gland. The trachea appears patent and is midline. Normal appearance of the esophagus. No mediastinal or left hilar adenopathy. 1 cm right hilar lymph node is identified. Increased from 0.7 cm previously. Lungs/Pleura: Centrilobular and paraseptal emphysema. New moderate right pleural effusion and small left pleural effusion. Significant interval increase in tumor burden within the right lower lobe which measures 8.6 x 6.7 by 6.4 cm, image 112/2 and image 52/7. On the sagittal images this has a AP diameter 9.7 cm spanning the right lower lobe and right middle lobe. Adjacent ground-glass attenuation and interlobular septal thickening noted concerning for lymphangitic spread. On the previous exam this lesion was limited to the central right lower lobe measuring 1.9 x 2.5 by 1.5 cm. Tumor extension into the right infrahilar region with encasement of the right lower lobe and right middle lobe bronchi identified with postobstructive pneumonitis. New tumor within the central right middle lobe measures 4.4 x 2.5 by 5.4 cm, image 117/2 and  image 25/6. Solid nodule within the right upper lobe measures 8 mm, image 31/5. Unchanged from previous exam. Thin walled cavitary nodule within the posterior left upper lobe measures 4 mm, image 43/5. Previously this measured 6 mm. Unchanged, ill-defined part solid nodule in the lateral aspect of the superior segment of left lower lobe measuring 1 cm, image 78/5. Triangular-shaped, subpleural nodule in the posterior left lower lobe is new measuring 1.4 cm and may represent an area of pulmonary infarct is this is in the distribution of the left lower lobe pulmonary embolus. Upper Abdomen: No acute abnormality. Musculoskeletal: No acute or aggressive bone lesions identified. IMPRESSION: 1. Considerable increase in tumor burden involving the left lower lobe and now the right middle lobe. There is overlying ground-glass attenuation and interlobular septal thickening concerning for lymphangitic spread of disease. 2. New bilateral pleural effusions right greater than left. 3. Additional small solid and sub solid nodules involving the right upper lobe and left lung are not significantly changed in the interval. 4. Left upper lobe and left lower lobe segmental branch acute pulmonary artery emboli. Electronically Signed: By: Kerby Moors M.D. On: 04/06/2019 15:47    Procedures .Critical Care Performed by: Lucrezia Starch, MD Authorized by: Lucrezia Starch, MD   Critical care provider statement:    Critical care time (minutes):  35   Critical care was necessary to treat or prevent imminent or life-threatening deterioration of the following conditions:  Respiratory failure   Critical care was time spent personally by me on the following activities:  Discussions with consultants, evaluation of patient's response to treatment, examination of patient, ordering and performing treatments and interventions, ordering and review of laboratory studies, ordering and review of radiographic studies, pulse oximetry,  re-evaluation of patient's condition, obtaining history from patient or surrogate and review of old charts   (including critical care time)  Medications Ordered in ED Medications  sodium chloride flush (NS) 0.9 % injection 3 mL (0 mLs Intravenous Hold 04/06/19 2019)  0.9 %  sodium chloride infusion (Manually program via Guardrails IV Fluids) (has no administration in time range)  heparin ADULT infusion 100 units/mL (25000 units/230mL sodium chloride 0.45%) (1,350 Units/hr Intravenous New Bag/Given 04/06/19 2350)     Initial Impression / Assessment and Plan / ED Course  I have reviewed the triage vital signs and the nursing notes.  Pertinent labs & imaging results that were available during my care of the patient were reviewed by me and considered in my medical decision making (see chart for details).        58 y/o male with adenocarcinoma of lung presents with worsening dyspnea, fatigue. Outpatient CT today concerning for progression of tumor, new pulmonary emboli. Hgb low to 6.8.  Had been previously low likely related to CA, chemo. Stool brown, heme occult negative. No evidence for any active bleeding. Discussed with admitting hospitalist Dr. Si Raider, plan to transfuse 1 unit, start [facility] with heparin without bolus and closely monitor H/H.  Noted signifciant tachycardia but no hypoxia and BP stable. Will admit to tele bed.   Final Clinical Impressions(s) / ED Diagnoses   Final diagnoses:  Acute pulmonary embolism without acute cor pulmonale, unspecified pulmonary embolism type (HCC)  Anemia, unspecified type  Malignant neoplasm of lung, unspecified laterality, unspecified part of lung [facility])    ED Discharge Orders    None       Lucrezia Starch, MD 04/07/19 [phone]

## 2019-04-07 NOTE — ED Notes (Signed)
Pt provided with meal tray and set up to eat. Call bell within reach

## 2019-04-07 NOTE — Progress Notes (Signed)
[facility]                                                                              Patient Demographics  Tyler [name], is a 58 y.o. male, DOB - 1960-08-06, ZOX:[id number]  Admit date - 04/06/2019   Admitting Physician No admitting provider for patient encounter.  Outpatient Primary MD for the patient is Tyler Males, MD  Outpatient specialists:   LOS - 0  days   Medical records reviewed and are as summarized below:    Chief Complaint  Patient presents with  . Chest Pain       Brief summary   Patient is a 58 year old male with history of stage III NSCLC, malnutrition, COPD, presented to ED with fatigue, generalized weakness, dyspnea on exertion.  Patient had finished course of chemo with neoadjuvant radiation approximately 1 month ago.  Patient had a CT of the chest today which showed pulmonary embolism and was referred immediately to ED.  No fevers or chills.  Patient reported at least a month of gradually worsening fatigue, generalized weakness, lightheadedness and dyspnea on exertion.  No prior history of DVT or PE.  Patient had blood transfusions in the setting of chemo-induced anemia.  Also reports poor appetite  COVID-19 test pending  Assessment & Plan    Principal Problem:   Acute Pulmonary embolism ([place]) in the setting of adeno CA lung - CTA chest showed considerable increase in the tumor burden involving the left lower lobe and now the right middle lobe, overlying groundglass attenuation and interlobular septal thickening concerning for lymphangitic spread of the disease.  New bilateral pleural effusions right greater than left, left upper lobe and left lower lobe segmental branch acute PE -Continue IV heparin drip -Obtain Doppler ultrasound of the lower extremities to rule out DVT, 2D echocardiogram -Oncology, Dr. Julien [name] added in the consult team, case management consult for co-pays for NOAC's   Active Problems: Bilateral pleural  effusions right greater than left -IR consult for thoracentesis, cytology   Hypokalemia -Replaced  Severe hypomagnesemia -Magnesium 1.3, magnesium replaced IV, 4 g IV x1    Protein-calorie malnutrition, severe -Nutrition consult    Tobacco abuse -Placed on nicotine patch    Adenocarcinoma of right lung, stage 3 ([place]), diagnosed in July 2020 - Follows Dr. Julien [name].  CTA chest showed increase in the tumor burden involving the left lower lobe and now the right middle lobe concerning for lymphangitic spread of the disease. - will consult Oncology  - Patient was started on chemo on 02/04/2019, also receiving chemoradiation - Patient also reports mild odynophagia, obtain SLP evaluation    Microcytic anemia in the setting of malignancy, chemotherapy -Hemoglobin currently 8.4 -Monitor closely on anticoagulation  History of COPD Currently no wheezing, continue home Spiriva, albuterol as needed   Code Status: Full CODE STATUS DVT Prophylaxis: IV heparin drip Family Communication: Discussed all imaging results, lab results, explained to the patient   Disposition Plan: Remains inpatient, on heparin drip for acute PE, worsening tumor burden, needs further work-up  Time Spent in minutes 35 minutes  Procedures:  CTA chest  Consultants:   Oncology notified Dr Tyler [name]  for Dr Tyler [name]   Antimicrobials:   Anti-infectives (From admission, onward)   None          Medications  Scheduled Meds: . feeding supplement (ENSURE ENLIVE)  237 mL Oral Daily  . potassium chloride  40 mEq Oral Daily  . sodium chloride flush  3 mL Intravenous Once  . sodium chloride flush  3 mL Intravenous Q12H  . sucralfate  1 g Oral TID WC & HS  . umeclidinium bromide  1 puff Inhalation Daily   Continuous Infusions: . sodium chloride    . heparin 1,500 Units/hr (04/07/19 1114)   PRN Meds:.sodium chloride, albuterol, prochlorperazine, sodium chloride flush      Subjective:   Tyler [name] was  seen and examined today.  Still weak and somewhat short of breath.  No chest pain.  No dizziness or lightheadedness. Patient denies  abdominal pain, N/V/D/C, new weakness, numbess, tingling.  No fevers  Objective:   Vitals:   04/07/19 0930 04/07/19 1000 04/07/19 1030 04/07/19 1100  BP: 111/84 127/84 115/76 107/82  Pulse: 76 (!) 112 97 91  Resp: 18 (!) 21 19 (!) 25  Temp:      TempSrc:      SpO2: 99% 97% 98% 98%  Weight:      Height:        Intake/Output Summary (Last 24 hours) at 04/07/2019 1126 Last data filed at 04/07/2019 1116 Gross per 24 hour  Intake 617.46 ml  Output -  Net 617.46 ml     Wt Readings from Last 3 Encounters:  04/06/19 61.2 kg  03/05/19 59.4 kg  02/19/19 62 kg     Exam  General: Alert and oriented x 3, NAD  Eyes:   HEENT:  Atraumatic, normocephalic, normal oropharynx  Cardiovascular: S1 S2 auscultated, no murmurs, RRR  Respiratory: Diminished breath sounds throughout Rt >left   Gastrointestinal: Soft, nontender, nondistended, + bowel sounds  Ext: no pedal edema bilaterally  Neuro: No new deficits  Musculoskeletal: No digital cyanosis, clubbing  Skin: No rashes  Psych: Normal affect and demeanor, alert and oriented x3    Data Reviewed:  I have personally reviewed following labs and imaging studies  Micro Results No results found for this or any previous visit (from the past 240 hour(s)).  Radiology Reports Dg Chest 2 View  Result Date: 04/06/2019 CLINICAL DATA:  Chest pain. EXAM: CHEST - 2 VIEW COMPARISON:  Radiograph of January 11, 2019. FINDINGS: The heart size and mediastinal contours are within normal limits. No pneumothorax is noted. Left lung is clear. Probable lower lobe atelectasis or pneumonia is noted with small right pleural effusion. The visualized skeletal structures are unremarkable. IMPRESSION: Right lower lobe opacity is noted most consistent with atelectasis or infiltrate with associated pleural effusion. Followup PA and  lateral chest X-ray is recommended in 3-4 weeks following trial of antibiotic therapy to ensure resolution and exclude underlying malignancy. Electronically Signed   By: Marijo Conception M.D.   On: 04/06/2019 20:19   Ct Chest W Contrast  Addendum Date: 04/06/2019   ADDENDUM REPORT: 04/06/2019 16:44 ADDENDUM: Critical Value/emergent results were called by telephone at the time of interpretation on 04/06/2019 at 4:43 pm to the [facility] triage nurse Elmer Picker, who verbally acknowledged these results. Electronically Signed   By: Kerby Moors M.D.   On: 04/06/2019 16:44   Result Date: 04/06/2019 CLINICAL DATA:  Restaging right lung cancer. Right supraclavicular pain and cough. EXAM: CT CHEST WITH CONTRAST TECHNIQUE: Multidetector CT  imaging of the chest was performed during intravenous contrast administration. CONTRAST:  40mL OMNIPAQUE IOHEXOL 300 MG/ML  SOLN COMPARISON:  CT chest 01/08/2019 and PET-CT 12/27/2018 FINDINGS: Cardiovascular: Normal heart size. No pericardial effusion. Mild aortic atherosclerosis. RCA and left circumflex coronary artery calcifications. There is a left upper lobe segmental branch pulmonary artery filling defect, image 76/2. This is compatible with acute pulmonary embolus. Additional PE identified within a segmental branch of the left lower lobe pulmonary artery, image 101/2 Mediastinum/Nodes: Normal appearance of the thyroid gland. The trachea appears patent and is midline. Normal appearance of the esophagus. No mediastinal or left hilar adenopathy. 1 cm right hilar lymph node is identified. Increased from 0.7 cm previously. Lungs/Pleura: Centrilobular and paraseptal emphysema. New moderate right pleural effusion and small left pleural effusion. Significant interval increase in tumor burden within the right lower lobe which measures 8.6 x 6.7 by 6.4 cm, image 112/2 and image 52/7. On the sagittal images this has a AP diameter 9.7 cm spanning the right  lower lobe and right middle lobe. Adjacent ground-glass attenuation and interlobular septal thickening noted concerning for lymphangitic spread. On the previous exam this lesion was limited to the central right lower lobe measuring 1.9 x 2.5 by 1.5 cm. Tumor extension into the right infrahilar region with encasement of the right lower lobe and right middle lobe bronchi identified with postobstructive pneumonitis. New tumor within the central right middle lobe measures 4.4 x 2.5 by 5.4 cm, image 117/2 and image 25/6. Solid nodule within the right upper lobe measures 8 mm, image 31/5. Unchanged from previous exam. Thin walled cavitary nodule within the posterior left upper lobe measures 4 mm, image 43/5. Previously this measured 6 mm. Unchanged, ill-defined part solid nodule in the lateral aspect of the superior segment of left lower lobe measuring 1 cm, image 78/5. Triangular-shaped, subpleural nodule in the posterior left lower lobe is new measuring 1.4 cm and may represent an area of pulmonary infarct is this is in the distribution of the left lower lobe pulmonary embolus. Upper Abdomen: No acute abnormality. Musculoskeletal: No acute or aggressive bone lesions identified. IMPRESSION: 1. Considerable increase in tumor burden involving the left lower lobe and now the right middle lobe. There is overlying ground-glass attenuation and interlobular septal thickening concerning for lymphangitic spread of disease. 2. New bilateral pleural effusions right greater than left. 3. Additional small solid and sub solid nodules involving the right upper lobe and left lung are not significantly changed in the interval. 4. Left upper lobe and left lower lobe segmental branch acute pulmonary artery emboli. Electronically Signed: By: Kerby Moors M.D. On: 04/06/2019 15:47    Lab Data:  CBC: Recent Labs  Lab 04/06/19 1134 04/06/19 2036 04/07/19 0628  WBC 10.7* 9.6 9.7  NEUTROABS 7.9*  --   --   HGB 7.4* 6.8* 8.4*  HCT  22.2* 21.0* 24.8*  MCV 77.9* 79.8* 79.7*  PLT 149* 151 242*   Basic Metabolic Panel: Recent Labs  Lab 04/06/19 1134 04/06/19 2036 04/06/19 2300 04/07/19 0628 04/07/19 0839  NA 136 132*  --  132*  --   K 2.9* 3.0*  --  3.2*  --   CL 97* 93*  --  95*  --   CO2 30 27  --  28  --   GLUCOSE 61* 91  --  100*  --   BUN 7 6  --  7  --   CREATININE 0.54* 0.38*  --  <0.30*  --  CALCIUM 7.8* 7.8*  --  7.4*  --   MG  --   --  1.3*  --  1.3*   GFR: CrCl cannot be calculated (This lab value cannot be used to calculate CrCl because it is not a number: <0.30). Liver Function Tests: Recent Labs  Lab 04/06/19 1134  AST 15  ALT 6  ALKPHOS 85  BILITOT 0.8  PROT 6.1*  ALBUMIN 2.2*   No results for input(s): LIPASE, AMYLASE in the last 168 hours. No results for input(s): AMMONIA in the last 168 hours. Coagulation Profile: Recent Labs  Lab 04/06/19 2217  INR 1.2   Cardiac Enzymes: No results for input(s): CKTOTAL, CKMB, CKMBINDEX, TROPONINI in the last 168 hours. BNP (last 3 results) No results for input(s): PROBNP in the last 8760 hours. HbA1C: No results for input(s): HGBA1C in the last 72 hours. CBG: No results for input(s): GLUCAP in the last 168 hours. Lipid Profile: No results for input(s): CHOL, HDL, LDLCALC, TRIG, CHOLHDL, LDLDIRECT in the last 72 hours. Thyroid Function Tests: No results for input(s): TSH, T4TOTAL, FREET4, T3FREE, THYROIDAB in the last 72 hours. Anemia Panel: Recent Labs    04/07/19 0628  FERRITIN 1,026*  TIBC 199*  IRON 68   Urine analysis:    Component Value Date/Time   COLORURINE YELLOW 08/03/2017 [place] 08/03/2017 1755   LABSPEC 1.019 08/03/2017 1755   PHURINE 5.0 08/03/2017 1755   GLUCOSEU NEGATIVE 08/03/2017 1755   HGBUR MODERATE (A) 08/03/2017 [place] 08/03/2017 1755   KETONESUR NEGATIVE 08/03/2017 1755   PROTEINUR 100 (A) 08/03/2017 1755   NITRITE NEGATIVE 08/03/2017 1755   LEUKOCYTESUR  NEGATIVE 08/03/2017 1755     Ripudeep Rai M.D. [facility] 04/07/2019, 11:26 AM  Pager: [phone] Between 7am to 7pm - call Pager - [phone]  After 7pm go to [email or web address] - password [email or web address]  Call night coverage person covering after 7pm

## 2019-04-07 NOTE — Procedures (Signed)
PROCEDURE SUMMARY:  Successful image-guided right thoracentesis. Yielded 450 milliliters of clear yellow fluid. Patient tolerated procedure well. No immediate complications. EBL = 0 mL.  Specimen was sent for labs. CXR ordered.  Please see imaging section of Epic for full dictation.   Alexandra Louk PA-C 04/07/2019 1:50 PM

## 2019-04-07 NOTE — Progress Notes (Signed)
  Echocardiogram 2D Echocardiogram has been performed.  Bobbye Charleston 04/07/2019, 2:30 PM

## 2019-04-08 ENCOUNTER — Inpatient Hospital Stay (HOSPITAL_COMMUNITY): Payer: Medicaid Other

## 2019-04-08 DIAGNOSIS — I82403 Acute embolism and thrombosis of unspecified deep veins of lower extremity, bilateral: Secondary | ICD-10-CM

## 2019-04-08 DIAGNOSIS — E876 Hypokalemia: Secondary | ICD-10-CM

## 2019-04-08 DIAGNOSIS — I2699 Other pulmonary embolism without acute cor pulmonale: Secondary | ICD-10-CM

## 2019-04-08 DIAGNOSIS — E43 Unspecified severe protein-calorie malnutrition: Secondary | ICD-10-CM

## 2019-04-08 DIAGNOSIS — Z720 Tobacco use: Secondary | ICD-10-CM

## 2019-04-08 LAB — BASIC METABOLIC PANEL
Anion gap: 10 (ref 5–15)
BUN: 7 mg/dL (ref 6–20)
CO2: 23 mmol/L (ref 22–32)
Calcium: 7.6 mg/dL — ABNORMAL LOW (ref 8.9–10.3)
Chloride: 102 mmol/L (ref 98–111)
Creatinine, Ser: 0.35 mg/dL — ABNORMAL LOW (ref 0.61–1.24)
GFR calc Af Amer: >60 mL/min (ref >60)
GFR calc non Af Amer: >60 mL/min (ref >60)
Glucose, Bld: 83 mg/dL (ref 70–99)
Potassium: 4.1 mmol/L (ref 3.5–5.1)
Sodium: 135 mmol/L (ref 135–145)

## 2019-04-08 LAB — TYPE AND SCREEN
ABO/RH(D): B POS
Antibody Screen: NEGATIVE
Unit division: 0

## 2019-04-08 LAB — BPAM RBC
Blood Product Expiration Date: 202010182359
ISSUE DATE / TIME: 202009260007
Unit Type and Rh: 7300

## 2019-04-08 LAB — CBC
HCT: 27.2 % — ABNORMAL LOW (ref 39.0–52.0)
Hemoglobin: 8.9 g/dL — ABNORMAL LOW (ref 13.0–17.0)
MCH: 26.7 pg (ref 26.0–34.0)
MCHC: 32.7 g/dL (ref 30.0–36.0)
MCV: 81.7 fL (ref 80.0–100.0)
Platelets: 131 10*3/uL — ABNORMAL LOW (ref 150–400)
RBC: 3.33 MIL/uL — ABNORMAL LOW (ref 4.22–5.81)
RDW: 22.8 % — ABNORMAL HIGH (ref 11.5–15.5)
WBC: 9.4 10*3/uL (ref 4.0–10.5)
nRBC: 0 % (ref 0.0–0.2)

## 2019-04-08 LAB — HEPARIN LEVEL (UNFRACTIONATED)
Heparin Unfractionated: 0.46 IU/mL (ref 0.30–0.70)
Heparin Unfractionated: 0.55 IU/mL (ref 0.30–0.70)

## 2019-04-08 MED ORDER — ENSURE ENLIVE PO LIQD
237.0000 mL | Freq: Two times a day (BID) | ORAL | Status: DC
  Administered 2019-04-08 – 2019-04-10 (×4): 237 mL via ORAL

## 2019-04-08 NOTE — Progress Notes (Signed)
Initial Nutrition Assessment  DOCUMENTATION CODES:   Underweight  INTERVENTION:   -Ensure Enlive po BID, each supplement provides 350 kcal and 20 grams of protein -Multivitamin with minerals daily  NUTRITION DIAGNOSIS:   Increased nutrient needs related to cancer and cancer related treatments as evidenced by estimated needs.  GOAL:   Patient will meet greater than or equal to 90% of their needs  MONITOR:   PO intake, Supplement acceptance, Labs, Weight trends, I & O's  REASON FOR ASSESSMENT:   Consult Assessment of nutrition requirement/status  ASSESSMENT:   58 year old male with history of stage III NSCLC, malnutrition, COPD, presented to ED with fatigue, generalized weakness, dyspnea on exertion.  Patient had finished course of chemo with neoadjuvant radiation approximately 1 month ago. Admitted for pulmonary embolism.  **RD working remotely**  Patient reports continued poor appetite and continues to have pain with swallowing foods that are not soft. SLP evaluated on 9/26 and recommended regular diet consistency. Pt uses a lidocaine spray to aid swallowing pain.  Pt drinks Ensure supplements at home, will increase order to at least BID given underweight status.   UBW: 140 lbs. Per weight records, pt has lost 7 lbs since July 2020 (5% wt loss x 3 months, insignificant for time frame).  I/Os: +613 since admit UOP 9/26: 550 ml   Labs reviewed. Medications: K-DUR tablet daily   NUTRITION - FOCUSED PHYSICAL EXAM:  Unable to perform -working remotely.  Diet Order:   Diet Order            Diet regular Room service appropriate? Yes; Fluid consistency: Thin  Diet effective now              EDUCATION NEEDS:   No education needs have been identified at this time  Skin:  Skin Assessment: Reviewed RN Assessment  Last BM:  9/26  Height:   Ht Readings from Last 1 Encounters:  04/07/19 6\' 2"  (1.88 m)    Weight:   Wt Readings from Last 1 Encounters:   04/07/19 60.4 kg    Ideal Body Weight:  86.3 kg  BMI:  Body mass index is 17.1 kg/m.  Estimated Nutritional Needs:   Kcal:  1800-2000  Protein:  80-90g  Fluid:  2L/day  Clayton Bibles, MS, RD, LDN Inpatient Clinical Dietitian Pager: [phone] After Hours Pager: [phone]

## 2019-04-08 NOTE — Progress Notes (Signed)
Bilateral lower extremity venous duplex completed. Preliminary results in Chart review CV Proc. [facility], [place] 04/08/2019, 8:58 AM

## 2019-04-08 NOTE — Progress Notes (Addendum)
ANTICOAGULATION CONSULT NOTE  Pharmacy Consult for Heparin Indication: PE/DVT  No Known Allergies  Patient Measurements: Height: 6\' 2"  (188 cm) Weight: 133 lb 2.5 oz (60.4 kg) IBW/kg (Calculated) : 82.2 Heparin Dosing Weight: actual body weight   Vital Signs: Temp: 98.2 F (36.8 C) (09/27 0514) Temp Source: Oral (09/27 0514) BP: 102/82 (09/27 0514) Pulse Rate: 80 (09/27 0514)  Labs: Recent Labs    04/06/19 2036 04/06/19 2217 04/07/19 0628 04/07/19 0839 04/07/19 1730 04/08/19 0433  HGB 6.8*  --  8.4*  --   --  8.9*  HCT 21.0*  --  24.8*  --   --  27.2*  PLT 151  --  135*  --   --  131*  APTT  --  34  --   --   --   --   LABPROT  --  15.1  --   --   --   --   INR  --  1.2  --   --   --   --   HEPARINUNFRC  --   --   --  0.23* 0.19* 0.46  CREATININE 0.38*  --  <0.30*  --   --  0.35*  TROPONINIHS 5  --   --   --   --   --     Estimated Creatinine Clearance: 83.9 mL/min (A) (by C-G formula based on SCr of 0.35 mg/dL (L)).   Medical History: Past Medical History:  Diagnosis Date  . Alcohol abuse   . Chronic back pain   . COPD (chronic obstructive pulmonary disease) ([place])   . Dyspnea   . Lung mass    mediastinal adenopathy  . Lung nodule   . Multiple pulmonary nodules 04/02/2018  . Pneumonia   . Right lower lobe pulmonary nodule 04/02/2018   12/01/2017: PET SUV 2.1  . Tobacco abuse     Assessment: 58 year old male with PMH of adenocarcinoma of the lung admitted with new PE. Pharmacy consulted for IV heparin dosing. No bolus per MD orders. Baseline coags WNL. Patient not on any anticoagulants PTA.    Today, 04/08/19:  AM heparin level = 0.46 units/mL, now therapeutic  CBC: Hgb low but stable at 8.9, Pltc decreased to 131K  Per discussion with RN, IV site moved last night  No bleeding or infusion issues noted per nursing  LE dopplers + for bilateral DVT  Goal of Therapy:  Heparin level 0.3-0.7 units/ml Monitor platelets by anticoagulation protocol:  Yes   Plan:  Continue heparin infusion at 1650 units/hr Check confirmatory heparin level 6 hours from previous level to ensure remains within therapeutic range Daily CBC, heparin level Monitor closely for s/sx of bleeding   Lindell Spar, PharmD, BCPS Clinical Pharmacist  04/08/2019,9:54 AM    Addendum:  Confirmatory heparin level remains therapeutic at 0.55 units/mL.   Plan: Continue heparin infusion at 1650 units/hr Daily CBC, heparin level Monitor closely for s/sx of bleeding   Lindell Spar, PharmD, BCPS Clinical Pharmacist  04/08/2019 12:55 PM

## 2019-04-08 NOTE — Progress Notes (Signed)
PROGRESS NOTE    Tyler [name]  XVQ:[id number] DOB: Sep 01, 1960 DOA: 04/06/2019 PCP: Brand Males, MD   Brief Narrative: Tyler [name] is a 58 y.o. male with history of stage III NSCLC, malnutrition, COPD, presented to ED with fatigue, generalized weakness, dyspnea on exertion. Patient presented with chest pain and dyspnea on exertion and found to have a PE. He was started on Heparin IV. Also with evidence of bilateral LE DVTs.   Assessment & Plan:   Principal Problem:   Pulmonary embolism (HCC) Active Problems:   Protein-calorie malnutrition, severe   Tobacco abuse   Adenocarcinoma of right lung, stage 3 (HCC)   Microcytic anemia   Hypokalemia   Acute pulmonary embolism (HCC)   Pulmonary embolism Bilateral LE DVT In setting of active cancer. Patient is currently doing well on heparin drip. Asymptomatic. Currently does not have insurance which will be a problem for discharge. Optimal therapy would likely be Lovenox, but will await Oncology recommendations. -CM consult for medications -Continue Heparin drip  Stage III lung adenocarcinoma Patient follows Dr. Julien Nordmann and Dr. Lisbeth Renshaw previously on chemotherapy and radiation therapy. CT evidence is concerning for increased tumor burden. Oncology added to care teams but will formally consult -Oncology recommendations  Tobacco abuse -Nicotine patch  Bilateral pleural effusions S/p thoracentesis on 9/26. Cytology pending  Hypokalemia Hypomagnesemia Replete as needed  Microcytic anemia In setting of malignancy and chemo. Baseline appears to be around 9-10. Hemoglobin of 7.4 on admission, then down to 6.8 and given 1 unit of PRBC. Currently stable -Daily CBC while on heparin drip  History of COPD Stable.   DVT prophylaxis: Heparin drip Code Status:   Code Status: Full Code Family Communication: None at bedside Disposition Plan: Discharge pending transition to outpatient anticoagulation regimen and oncology  recommendations   Consultants:   Medical oncology  Interventional radiology  Procedures:   9/26: Thoracentesis  Antimicrobials:  None    Subjective: No chest pain or dyspnea. No leg pain or swelling.  Objective: Vitals:   04/07/19 1830 04/07/19 1900 04/07/19 2002 04/08/19 0514  BP: 122/90 127/83 127/72 102/82  Pulse: 91 (!) 102 95 80  Resp: (!) 21 (!) 28 20 15   Temp:   99.8 F (37.7 C) 98.2 F (36.8 C)  TempSrc:   Oral Oral  SpO2: 100% 100% 98% 100%  Weight:   60.4 kg   Height:   6\' 2"  (1.88 m)     Intake/Output Summary (Last 24 hours) at 04/08/2019 1252 Last data filed at 04/08/2019 1106 Gross per 24 hour  Intake 546.03 ml  Output 550 ml  Net -3.97 ml   Filed Weights   04/06/19 1954 04/07/19 2002  Weight: 61.2 kg 60.4 kg    Examination:  General exam: Appears calm and comfortable Respiratory system: Clear to auscultation. Respiratory effort normal. Cardiovascular system: S1 & S2 heard, irregular rhythm, normal rate. No murmurs, rubs, gallops or clicks. Gastrointestinal system: Abdomen is nondistended, soft and nontender. No organomegaly or masses felt. Normal bowel sounds heard. Central nervous system: Alert and oriented. No focal neurological deficits. Extremities: No edema. No calf tenderness Skin: No cyanosis. No rashes Psychiatry: Judgement and insight appear normal. Mood & affect appropriate.     Data Reviewed: I have personally reviewed following labs and imaging studies  CBC: Recent Labs  Lab 04/06/19 1134 04/06/19 2036 04/07/19 0628 04/08/19 0433  WBC 10.7* 9.6 9.7 9.4  NEUTROABS 7.9*  --   --   --   HGB 7.4* 6.8* 8.4* 8.9*  HCT 22.2* 21.0* 24.8* 27.2*  MCV 77.9* 79.8* 79.7* 81.7  PLT 149* 151 135* 409*   Basic Metabolic Panel: Recent Labs  Lab 04/06/19 1134 04/06/19 2036 04/06/19 2300 04/07/19 0628 04/07/19 0839 04/08/19 0433  NA 136 132*  --  132*  --  135  K 2.9* 3.0*  --  3.2*  --  4.1  CL 97* 93*  --  95*  --  102    CO2 30 27  --  28  --  23  GLUCOSE 61* 91  --  100*  --  83  BUN 7 6  --  7  --  7  CREATININE 0.54* 0.38*  --  <0.30*  --  0.35*  CALCIUM 7.8* 7.8*  --  7.4*  --  7.6*  MG  --   --  1.3*  --  1.3*  --    GFR: Estimated Creatinine Clearance: 83.9 mL/min (A) (by C-G formula based on SCr of 0.35 mg/dL (L)). Liver Function Tests: Recent Labs  Lab 04/06/19 1134  AST 15  ALT 6  ALKPHOS 85  BILITOT 0.8  PROT 6.1*  ALBUMIN 2.2*   No results for input(s): LIPASE, AMYLASE in the last 168 hours. No results for input(s): AMMONIA in the last 168 hours. Coagulation Profile: Recent Labs  Lab 04/06/19 2217  INR 1.2   Cardiac Enzymes: No results for input(s): CKTOTAL, CKMB, CKMBINDEX, TROPONINI in the last 168 hours. BNP (last 3 results) No results for input(s): PROBNP in the last 8760 hours. HbA1C: No results for input(s): HGBA1C in the last 72 hours. CBG: No results for input(s): GLUCAP in the last 168 hours. Lipid Profile: No results for input(s): CHOL, HDL, LDLCALC, TRIG, CHOLHDL, LDLDIRECT in the last 72 hours. Thyroid Function Tests: No results for input(s): TSH, T4TOTAL, FREET4, T3FREE, THYROIDAB in the last 72 hours. Anemia Panel: Recent Labs    04/07/19 0628  FERRITIN 1,026*  TIBC 199*  IRON 68   Sepsis Labs: No results for input(s): PROCALCITON, LATICACIDVEN in the last 168 hours.  Recent Results (from the past 240 hour(s))  SARS CORONAVIRUS 2 (TAT 6-24 HRS) Nasopharyngeal Nasopharyngeal Swab     Status: None   Collection Time: 04/06/19 10:53 PM   Specimen: Nasopharyngeal Swab  Result Value Ref Range Status   SARS Coronavirus 2 NEGATIVE NEGATIVE Final    Comment: (NOTE) SARS-CoV-2 target nucleic acids are NOT DETECTED. The SARS-CoV-2 RNA is generally detectable in upper and lower respiratory specimens during the acute phase of infection. Negative results do not preclude SARS-CoV-2 infection, do not rule out co-infections with other pathogens, and should not  be used as the sole basis for treatment or other patient management decisions. Negative results must be combined with clinical observations, patient history, and epidemiological information. The expected result is Negative. Fact Sheet for Patients: SugarRoll.be Fact Sheet for Healthcare Providers: https://www.woods-mathews.com/ This test is not yet approved or cleared by the [place] FDA and  has been authorized for detection and/or diagnosis of SARS-CoV-2 by FDA under an Emergency Use Authorization (EUA). This EUA will remain  in effect (meaning this test can be used) for the duration of the COVID-19 declaration under Section 56 4(b)(1) of the Act, 21 U.S.C. section 360bbb-3(b)(1), unless the authorization is terminated or revoked sooner. Performed at [facility], [place] [address] [place] [address] [place], [place] [address]   Culture, body fluid-bottle     Status: None (Preliminary result)   Collection Time: 04/07/19  1:21 PM  Specimen: Fluid  Result Value Ref Range Status   Specimen Description FLUID  Final   Special Requests NONE  Final   Gram Stain   Final    FEW WBC PRESENT,BOTH PMN AND MONONUCLEAR NO ORGANISMS SEEN    Culture   Final    NO GROWTH < 24 HOURS Performed at [facility], [place] [address] [place], [place] [address]    Report Status PENDING  Incomplete         Radiology Studies: Dg Chest 2 View  Result Date: 04/06/2019 CLINICAL DATA:  Chest pain. EXAM: CHEST - 2 VIEW COMPARISON:  Radiograph of January 11, 2019. FINDINGS: The heart size and mediastinal contours are within normal limits. No pneumothorax is noted. Left lung is clear. Probable lower lobe atelectasis or pneumonia is noted with small right pleural effusion. The visualized skeletal structures are unremarkable. IMPRESSION: Right lower lobe opacity is noted most consistent with atelectasis or infiltrate with associated pleural effusion. Followup PA and lateral  chest X-ray is recommended in 3-4 weeks following trial of antibiotic therapy to ensure resolution and exclude underlying malignancy. Electronically Signed   By: Marijo Conception M.D.   On: 04/06/2019 20:19   Ct Chest W Contrast  Addendum Date: 04/06/2019   ADDENDUM REPORT: 04/06/2019 16:44 ADDENDUM: Critical Value/emergent results were called by telephone at the time of interpretation on 04/06/2019 at 4:43 pm to the [facility] triage nurse Elmer Picker, who verbally acknowledged these results. Electronically Signed   By: Kerby Moors M.D.   On: 04/06/2019 16:44   Result Date: 04/06/2019 CLINICAL DATA:  Restaging right lung cancer. Right supraclavicular pain and cough. EXAM: CT CHEST WITH CONTRAST TECHNIQUE: Multidetector CT imaging of the chest was performed during intravenous contrast administration. CONTRAST:  30mL OMNIPAQUE IOHEXOL 300 MG/ML  SOLN COMPARISON:  CT chest 01/08/2019 and PET-CT 12/27/2018 FINDINGS: Cardiovascular: Normal heart size. No pericardial effusion. Mild aortic atherosclerosis. RCA and left circumflex coronary artery calcifications. There is a left upper lobe segmental branch pulmonary artery filling defect, image 76/2. This is compatible with acute pulmonary embolus. Additional PE identified within a segmental branch of the left lower lobe pulmonary artery, image 101/2 Mediastinum/Nodes: Normal appearance of the thyroid gland. The trachea appears patent and is midline. Normal appearance of the esophagus. No mediastinal or left hilar adenopathy. 1 cm right hilar lymph node is identified. Increased from 0.7 cm previously. Lungs/Pleura: Centrilobular and paraseptal emphysema. New moderate right pleural effusion and small left pleural effusion. Significant interval increase in tumor burden within the right lower lobe which measures 8.6 x 6.7 by 6.4 cm, image 112/2 and image 52/7. On the sagittal images this has a AP diameter 9.7 cm spanning the right lower lobe  and right middle lobe. Adjacent ground-glass attenuation and interlobular septal thickening noted concerning for lymphangitic spread. On the previous exam this lesion was limited to the central right lower lobe measuring 1.9 x 2.5 by 1.5 cm. Tumor extension into the right infrahilar region with encasement of the right lower lobe and right middle lobe bronchi identified with postobstructive pneumonitis. New tumor within the central right middle lobe measures 4.4 x 2.5 by 5.4 cm, image 117/2 and image 25/6. Solid nodule within the right upper lobe measures 8 mm, image 31/5. Unchanged from previous exam. Thin walled cavitary nodule within the posterior left upper lobe measures 4 mm, image 43/5. Previously this measured 6 mm. Unchanged, ill-defined part solid nodule in the lateral aspect of the superior segment of  left lower lobe measuring 1 cm, image 78/5. Triangular-shaped, subpleural nodule in the posterior left lower lobe is new measuring 1.4 cm and may represent an area of pulmonary infarct is this is in the distribution of the left lower lobe pulmonary embolus. Upper Abdomen: No acute abnormality. Musculoskeletal: No acute or aggressive bone lesions identified. IMPRESSION: 1. Considerable increase in tumor burden involving the left lower lobe and now the right middle lobe. There is overlying ground-glass attenuation and interlobular septal thickening concerning for lymphangitic spread of disease. 2. New bilateral pleural effusions right greater than left. 3. Additional small solid and sub solid nodules involving the right upper lobe and left lung are not significantly changed in the interval. 4. Left upper lobe and left lower lobe segmental branch acute pulmonary artery emboli. Electronically Signed: By: Kerby Moors M.D. On: 04/06/2019 15:47   Dg Chest Port 1 View  Result Date: 04/07/2019 CLINICAL DATA:  Status post right thoracentesis. EXAM: PORTABLE CHEST 1 VIEW COMPARISON:  Film earlier today at 0800  hours FINDINGS: In the frontal projection there appears to be complete evacuation of right pleural fluid. Underlying atelectasis/consolidation at the right lung base remains. No pneumothorax or pulmonary edema. Stable cardiac enlargement. IMPRESSION: No pneumothorax after right thoracentesis with complete evacuation of right pleural fluid. Underlying atelectasis and consolidation remains at the right lung base. Electronically Signed   By: Aletta Edouard M.D.   On: 04/07/2019 13:59   Vas [place] Lower Extremity Venous (dvt)  Result Date: 04/08/2019  Lower Venous Study Indications: Pulmonary embolism.  Risk Factors: Confirmed PE Cancer [place] - Lung. Comparison Study: No previous study available Performing Technologist: Toma Copier RVS  Examination Guidelines: A complete evaluation includes B-mode imaging, spectral Doppler, color Doppler, and power Doppler as needed of all accessible portions of each vessel. Bilateral testing is considered an integral part of a complete examination. Limited examinations for reoccurring indications may be performed as noted.  +---------+---------------+---------+-----------+----------+-------------------+  RIGHT     Compressibility Phasicity Spontaneity Properties Thrombus Aging       +---------+---------------+---------+-----------+----------+-------------------+  CFV       Full            Yes       Yes                                         +---------+---------------+---------+-----------+----------+-------------------+  SFJ       Full                                                                  +---------+---------------+---------+-----------+----------+-------------------+  FV Prox   Full            Yes       Yes                                         +---------+---------------+---------+-----------+----------+-------------------+  FV Mid    Full                                                                   +---------+---------------+---------+-----------+----------+-------------------+  FV Distal Full            Yes       Yes                                         +---------+---------------+---------+-----------+----------+-------------------+  PFV       Full            Yes       Yes                                         +---------+---------------+---------+-----------+----------+-------------------+  POP       Partial         Yes       Yes                    Acute -Phasic in                                                                 the proximal                                                                     region.              +---------+---------------+---------+-----------+----------+-------------------+  PTV       Partial                                          Acute mid to                                                                     proximal             +---------+---------------+---------+-----------+----------+-------------------+  PERO                                                       Not visualized       +---------+---------------+---------+-----------+----------+-------------------+   Right Technical Findings: Not visualized segments include Peroneal. Unable to visualize the peroneal vein. Mild technical difficulty throughout due to acoustic shadowing from calcification of the arteries  +---------+---------------+---------+-----------+----------+------------------+  LEFT      Compressibility Phasicity Spontaneity Properties Thrombus Aging      +---------+---------------+---------+-----------+----------+------------------+  CFV       Full            Yes  Yes                                        +---------+---------------+---------+-----------+----------+------------------+  SFJ       Full                                                                 +---------+---------------+---------+-----------+----------+------------------+  FV Prox   Full            Yes       Yes                                         +---------+---------------+---------+-----------+----------+------------------+  FV Mid    Full            Yes       Yes                                        +---------+---------------+---------+-----------+----------+------------------+  FV Distal Partial                                          Acute diatal to                                                                 mid                 +---------+---------------+---------+-----------+----------+------------------+  PFV       Full            Yes       Yes                                        +---------+---------------+---------+-----------+----------+------------------+  POP       Partial                                          Acute               +---------+---------------+---------+-----------+----------+------------------+  PTV       Partial                                          Acute - Mid to  proximal            +---------+---------------+---------+-----------+----------+------------------+  PERO      Full                                                                 +---------+---------------+---------+-----------+----------+------------------+   Left Technical Findings: Difficult to visualize the peroneal veins. Mld technical difficulty due to acoustic shadowing calification of the arteries.   Summary: Right: Findings consistent with acute deep vein thrombosis involving the right popliteal vein, and right posterior tibial veins. See technical findings listed above Left: Findings consistent with acute deep vein thrombosis involving the left posterior tibial veins, left popliteal vein, and left femoral vein. See technical findings listed above.  *See table(s) above for measurements and observations. Electronically signed by Curt Jews MD on 04/08/2019 at 9:47:50 AM.    Final    US Thoracentesis Asp Pleural Space W/img Guide  Result Date:  04/07/2019 INDICATION: Patient with history of adenocarcinoma of right lung, pulmonary embolism of left lung, dyspnea, and right pleural effusion. Request is made for diagnostic and therapeutic right thoracentesis. EXAM: ULTRASOUND GUIDED DIAGNOSTIC AND THERAPEUTIC RIGHT THORACENTESIS MEDICATIONS: 10 mL of 1% lidocaine COMPLICATIONS: None immediate. PROCEDURE: An ultrasound guided thoracentesis was thoroughly discussed with the patient and questions answered. The benefits, risks, alternatives and complications were also discussed. The patient understands and wishes to proceed with the procedure. Written consent was obtained. Ultrasound was performed to localize and mark an adequate pocket of fluid in the right chest. The area was then prepped and draped in the normal sterile fashion. 1% Lidocaine was used for local anesthesia. Under ultrasound guidance a 6 Fr Safe-T-Centesis catheter was introduced. Thoracentesis was performed. The catheter was removed and a dressing applied. FINDINGS: A total of approximately 450 mL of clear yellow fluid was removed. Samples were sent to the laboratory as requested by the clinical team. IMPRESSION: Successful ultrasound guided right thoracentesis yielding 450 mL of pleural fluid. Read by: Earley Abide, PA-C Electronically Signed   By: Aletta Edouard M.D.   On: 04/07/2019 14:04        Scheduled Meds:  feeding supplement (ENSURE ENLIVE)  237 mL Oral BID BM   influenza vac split quadrivalent PF  0.5 mL Intramuscular Tomorrow-1000   potassium chloride  40 mEq Oral Daily   sodium chloride flush  3 mL Intravenous Once   sodium chloride flush  3 mL Intravenous Q12H   sucralfate  1 g Oral TID WC & HS   umeclidinium bromide  1 puff Inhalation Daily   Continuous Infusions:  sodium chloride     heparin 1,650 Units/hr (04/08/19 1146)     LOS: 1 day     Cordelia Poche, MD [facility] 04/08/2019, 12:52 PM  If 7PM-7AM, please contact  night-coverage [email or web address]

## 2019-04-09 ENCOUNTER — Inpatient Hospital Stay: Payer: Medicaid Other | Admitting: Internal Medicine

## 2019-04-09 DIAGNOSIS — I2699 Other pulmonary embolism without acute cor pulmonale: Principal | ICD-10-CM

## 2019-04-09 DIAGNOSIS — J90 Pleural effusion, not elsewhere classified: Secondary | ICD-10-CM

## 2019-04-09 DIAGNOSIS — D649 Anemia, unspecified: Secondary | ICD-10-CM

## 2019-04-09 DIAGNOSIS — C3491 Malignant neoplasm of unspecified part of right bronchus or lung: Secondary | ICD-10-CM

## 2019-04-09 LAB — BASIC METABOLIC PANEL
Anion gap: 10 (ref 5–15)
BUN: 8 mg/dL (ref 6–20)
CO2: 23 mmol/L (ref 22–32)
Calcium: 8.1 mg/dL — ABNORMAL LOW (ref 8.9–10.3)
Chloride: 101 mmol/L (ref 98–111)
Creatinine, Ser: 0.43 mg/dL — ABNORMAL LOW (ref 0.61–1.24)
GFR calc Af Amer: >60 mL/min (ref >60)
GFR calc non Af Amer: >60 mL/min (ref >60)
Glucose, Bld: 98 mg/dL (ref 70–99)
Potassium: 4.4 mmol/L (ref 3.5–5.1)
Sodium: 134 mmol/L — ABNORMAL LOW (ref 135–145)

## 2019-04-09 LAB — PH, BODY FLUID: pH, Body Fluid: 7.6

## 2019-04-09 LAB — CBC
HCT: 26.1 % — ABNORMAL LOW (ref 39.0–52.0)
Hemoglobin: 8.4 g/dL — ABNORMAL LOW (ref 13.0–17.0)
MCH: 27 pg (ref 26.0–34.0)
MCHC: 32.2 g/dL (ref 30.0–36.0)
MCV: 83.9 fL (ref 80.0–100.0)
Platelets: 146 10*3/uL — ABNORMAL LOW (ref 150–400)
RBC: 3.11 MIL/uL — ABNORMAL LOW (ref 4.22–5.81)
RDW: 23.2 % — ABNORMAL HIGH (ref 11.5–15.5)
WBC: 10.2 10*3/uL (ref 4.0–10.5)
nRBC: 0 % (ref 0.0–0.2)

## 2019-04-09 LAB — GRAM STAIN

## 2019-04-09 LAB — HEPARIN LEVEL (UNFRACTIONATED): Heparin Unfractionated: 0.3 IU/mL (ref 0.30–0.70)

## 2019-04-09 MED ORDER — HEPARIN (PORCINE) 25000 UT/250ML-% IV SOLN
1700.0000 [IU]/h | INTRAVENOUS | Status: DC
  Administered 2019-04-09 (×2): 1700 [IU]/h via INTRAVENOUS
  Filled 2019-04-09 (×2): qty 250

## 2019-04-09 NOTE — TOC Progression Note (Signed)
Transition of Care [facility]) - Progression Note    Patient Details  Name: Tyler [name] MRN: [id number] Date of Birth: Jul 12, 1961  [facility] CM/SW Contact  Markavious Micco, Juliann Pulse, RN Phone Number: 04/09/2019, 3:56 PM  Clinical Narrative: Will check with [facility] pharmacy for med asst-await response. [place] for pcp appt.           Expected Discharge Plan and Services           Expected Discharge Date: 04/09/19                                     Social Determinants of Health (SDOH) Interventions    Readmission Risk Interventions No flowsheet data found.

## 2019-04-09 NOTE — Progress Notes (Signed)
HEMATOLOGY-ONCOLOGY PROGRESS NOTE  SUBJECTIVE: [name] has a history of stage IIIa non-small cell lung cancer, adenocarcinoma.  He received a course of concurrent chemoradiation with weekly carboplatin and Taxol his last chemotherapy was given on 03/12/2019 and last radiation was given on 03/14/2019.  The patient had a restaging CT scan of the chest performed on the day of admission which showed considerable increase in tumor burden involving the left lower lobe and now the right middle lobe, new bilateral pleural effusions right greater than left, left upper lobe and left lower lobe segmental branch acute pulmonary artery emboli.  He was referred to the emergency room for admission.  On the day of admission, he had a 1 month history of gradually worsening fatigue, weakness, lightheadedness, and dyspnea on exertion.  He was not having any chest pain or lower extremity edema.  On admission, his hemoglobin was 6.81 unit of packed red blood cells on 04/07/2019.  He was started on a heparin drip for the pulmonary emboli.  Doppler ultrasound of the bilateral lower extremities showed bilateral DVT.  He underwent a right thoracentesis on 04/07/2019 with 450 cc of pleural fluid removed.  Cytology pending.  Today, he reports ongoing weakness.  He denies bleeding.  Denies chest pain and shortness of breath.  Denies abdominal pain, nausea, vomiting.  Oncology History  Adenocarcinoma of right lung, stage 3 ([place])  01/17/2019 Initial Diagnosis   Adenocarcinoma of right lung, stage 3 ([place])   01/29/2019 -  Chemotherapy   The patient had palonosetron (ALOXI) injection 0.25 mg, 0.25 mg, Intravenous,  Once, 7 of 7 cycles Administration: 0.25 mg (01/29/2019), 0.25 mg (02/26/2019), 0.25 mg (03/05/2019), 0.25 mg (02/05/2019), 0.25 mg (03/12/2019), 0.25 mg (02/12/2019), 0.25 mg (02/19/2019) CARBOplatin (PARAPLATIN) 220 mg in sodium chloride 0.9 % 250 mL chemo infusion, 220 mg (100 % of original dose 222.2 mg), Intravenous,  Once, 7 of 7  cycles Dose modification: 222.2 mg (original dose 222.2 mg, Cycle 1) Administration: 220 mg (01/29/2019), 220 mg (02/26/2019), 220 mg (03/05/2019), 220 mg (03/12/2019), 220 mg (02/12/2019), 220 mg (02/19/2019) PACLitaxel (TAXOL) 84 mg in sodium chloride 0.9 % 250 mL chemo infusion (</= 80mg /m2), 45 mg/m2 = 84 mg, Intravenous,  Once, 7 of 7 cycles Administration: 84 mg (01/29/2019), 84 mg (02/26/2019), 84 mg (03/05/2019), 84 mg (02/05/2019), 84 mg (03/12/2019), 84 mg (02/12/2019), 84 mg (02/19/2019)  for chemotherapy treatment.       REVIEW OF SYSTEMS:   Constitutional: Denies fevers, chills.  Reports generalized fatigue/weakness. Eyes: Denies blurriness of vision Ears, nose, mouth, throat, and face: Denies mucositis or sore throat Respiratory: Denies cough, dyspnea or wheezes Cardiovascular: Denies palpitation, chest discomfort Gastrointestinal:  Denies nausea, heartburn or change in bowel habits Skin: Denies abnormal skin rashes Lymphatics: Denies new lymphadenopathy or easy bruising Neurological:Denies numbness, tingling or new weaknesses Behavioral/Psych: Mood is stable, no new changes  Extremities: No lower extremity edema All other systems were reviewed with the patient and are negative.  I have reviewed the past medical history, past surgical history, social history and family history with the patient and they are unchanged from previous note.   PHYSICAL EXAMINATION: ECOG PERFORMANCE STATUS: 1 - Symptomatic but completely ambulatory  Vitals:   04/08/19 2127 04/09/19 0501  BP:  105/74  Pulse: 93 100  Resp:  (!) 24  Temp:  97.8 F (36.6 C)  SpO2:  99%   Filed Weights   04/06/19 1954 04/07/19 2002  Weight: 135 lb (61.2 kg) 133 lb 2.5 oz (60.4 kg)  Intake/Output from previous day: 09/27 0701 - 09/28 0700 In: 1629.3 [P.O.:1300; I.V.:329.3] Out: 1535 [PPIRJ:1884]  GENERAL:alert, no distress and comfortable SKIN: skin color, texture, turgor are normal, no rashes or significant  lesions EYES: normal, Conjunctiva are pink and non-injected, sclera clear OROPHARYNX:no exudate, no erythema and lips, buccal mucosa, and tongue normal  NECK: supple, thyroid normal size, non-tender, without nodularity LYMPH:  no palpable lymphadenopathy in the cervical, axillary or inguinal LUNGS: clear to auscultation and percussion with normal breathing effort HEART: regular rate & rhythm and no murmurs and no lower extremity edema ABDOMEN:abdomen soft, non-tender and normal bowel sounds Musculoskeletal:no cyanosis of digits and no clubbing  NEURO: alert & oriented x 3 with fluent speech, no focal motor/sensory deficits  LABORATORY DATA:  I have reviewed the data as listed CMP Latest Ref Rng & Units 04/09/2019 04/08/2019 04/07/2019  Glucose 70 - 99 mg/dL 98 83 100(H)  BUN 6 - 20 mg/dL 8 7 7   Creatinine 0.61 - 1.24 mg/dL 0.43(L) 0.35(L) <0.30(L)  Sodium 135 - 145 mmol/L 134(L) 135 132(L)  Potassium 3.5 - 5.1 mmol/L 4.4 4.1 3.2(L)  Chloride 98 - 111 mmol/L 101 102 95(L)  CO2 22 - 32 mmol/L 23 23 28   Calcium 8.9 - 10.3 mg/dL 8.1(L) 7.6(L) 7.4(L)  Total Protein 6.5 - 8.1 g/dL - - -  Total Bilirubin 0.3 - 1.2 mg/dL - - -  Alkaline Phos 38 - 126 U/L - - -  AST 15 - 41 U/L - - -  ALT 0 - 44 U/L - - -    Lab Results  Component Value Date   WBC 10.2 04/09/2019   HGB 8.4 (L) 04/09/2019   HCT 26.1 (L) 04/09/2019   MCV 83.9 04/09/2019   PLT 146 (L) 04/09/2019   NEUTROABS 7.9 (H) 04/06/2019    Dg Chest 2 View  Result Date: 04/06/2019 CLINICAL DATA:  Chest pain. EXAM: CHEST - 2 VIEW COMPARISON:  Radiograph of January 11, 2019. FINDINGS: The heart size and mediastinal contours are within normal limits. No pneumothorax is noted. Left lung is clear. Probable lower lobe atelectasis or pneumonia is noted with small right pleural effusion. The visualized skeletal structures are unremarkable. IMPRESSION: Right lower lobe opacity is noted most consistent with atelectasis or infiltrate with associated  pleural effusion. Followup PA and lateral chest X-ray is recommended in 3-4 weeks following trial of antibiotic therapy to ensure resolution and exclude underlying malignancy. Electronically Signed   By: Marijo Conception M.D.   On: 04/06/2019 20:19   Ct Chest W Contrast  Addendum Date: 04/06/2019   ADDENDUM REPORT: 04/06/2019 16:44 ADDENDUM: Critical Value/emergent results were called by telephone at the time of interpretation on 04/06/2019 at 4:43 pm to the [facility] triage nurse Elmer Picker, who verbally acknowledged these results. Electronically Signed   By: Kerby Moors M.D.   On: 04/06/2019 16:44   Result Date: 04/06/2019 CLINICAL DATA:  Restaging right lung cancer. Right supraclavicular pain and cough. EXAM: CT CHEST WITH CONTRAST TECHNIQUE: Multidetector CT imaging of the chest was performed during intravenous contrast administration. CONTRAST:  66mL OMNIPAQUE IOHEXOL 300 MG/ML  SOLN COMPARISON:  CT chest 01/08/2019 and PET-CT 12/27/2018 FINDINGS: Cardiovascular: Normal heart size. No pericardial effusion. Mild aortic atherosclerosis. RCA and left circumflex coronary artery calcifications. There is a left upper lobe segmental branch pulmonary artery filling defect, image 76/2. This is compatible with acute pulmonary embolus. Additional PE identified within a segmental branch of the left lower lobe pulmonary artery, image  101/2 Mediastinum/Nodes: Normal appearance of the thyroid gland. The trachea appears patent and is midline. Normal appearance of the esophagus. No mediastinal or left hilar adenopathy. 1 cm right hilar lymph node is identified. Increased from 0.7 cm previously. Lungs/Pleura: Centrilobular and paraseptal emphysema. New moderate right pleural effusion and small left pleural effusion. Significant interval increase in tumor burden within the right lower lobe which measures 8.6 x 6.7 by 6.4 cm, image 112/2 and image 52/7. On the sagittal images this has a AP  diameter 9.7 cm spanning the right lower lobe and right middle lobe. Adjacent ground-glass attenuation and interlobular septal thickening noted concerning for lymphangitic spread. On the previous exam this lesion was limited to the central right lower lobe measuring 1.9 x 2.5 by 1.5 cm. Tumor extension into the right infrahilar region with encasement of the right lower lobe and right middle lobe bronchi identified with postobstructive pneumonitis. New tumor within the central right middle lobe measures 4.4 x 2.5 by 5.4 cm, image 117/2 and image 25/6. Solid nodule within the right upper lobe measures 8 mm, image 31/5. Unchanged from previous exam. Thin walled cavitary nodule within the posterior left upper lobe measures 4 mm, image 43/5. Previously this measured 6 mm. Unchanged, ill-defined part solid nodule in the lateral aspect of the superior segment of left lower lobe measuring 1 cm, image 78/5. Triangular-shaped, subpleural nodule in the posterior left lower lobe is new measuring 1.4 cm and may represent an area of pulmonary infarct is this is in the distribution of the left lower lobe pulmonary embolus. Upper Abdomen: No acute abnormality. Musculoskeletal: No acute or aggressive bone lesions identified. IMPRESSION: 1. Considerable increase in tumor burden involving the left lower lobe and now the right middle lobe. There is overlying ground-glass attenuation and interlobular septal thickening concerning for lymphangitic spread of disease. 2. New bilateral pleural effusions right greater than left. 3. Additional small solid and sub solid nodules involving the right upper lobe and left lung are not significantly changed in the interval. 4. Left upper lobe and left lower lobe segmental branch acute pulmonary artery emboli. Electronically Signed: By: Kerby Moors M.D. On: 04/06/2019 15:47   Dg Chest Port 1 View  Result Date: 04/07/2019 CLINICAL DATA:  Status post right thoracentesis. EXAM: PORTABLE CHEST 1  VIEW COMPARISON:  Film earlier today at 0800 hours FINDINGS: In the frontal projection there appears to be complete evacuation of right pleural fluid. Underlying atelectasis/consolidation at the right lung base remains. No pneumothorax or pulmonary edema. Stable cardiac enlargement. IMPRESSION: No pneumothorax after right thoracentesis with complete evacuation of right pleural fluid. Underlying atelectasis and consolidation remains at the right lung base. Electronically Signed   By: Aletta Edouard M.D.   On: 04/07/2019 13:59   Vas [place] Lower Extremity Venous (dvt)  Result Date: 04/08/2019  Lower Venous Study Indications: Pulmonary embolism.  Risk Factors: Confirmed PE Cancer [place] - Lung. Comparison Study: No previous study available Performing Technologist: Toma Copier RVS  Examination Guidelines: A complete evaluation includes B-mode imaging, spectral Doppler, color Doppler, and power Doppler as needed of all accessible portions of each vessel. Bilateral testing is considered an integral part of a complete examination. Limited examinations for reoccurring indications may be performed as noted.  +---------+---------------+---------+-----------+----------+-------------------+ RIGHT    CompressibilityPhasicitySpontaneityPropertiesThrombus Aging      +---------+---------------+---------+-----------+----------+-------------------+ CFV      Full           Yes      Yes                                      +---------+---------------+---------+-----------+----------+-------------------+  SFJ      Full                                                             +---------+---------------+---------+-----------+----------+-------------------+ FV Prox  Full           Yes      Yes                                      +---------+---------------+---------+-----------+----------+-------------------+ FV Mid   Full                                                              +---------+---------------+---------+-----------+----------+-------------------+ FV DistalFull           Yes      Yes                                      +---------+---------------+---------+-----------+----------+-------------------+ PFV      Full           Yes      Yes                                      +---------+---------------+---------+-----------+----------+-------------------+ POP      Partial        Yes      Yes                  Acute -Phasic in                                                          the proximal                                                              region.             +---------+---------------+---------+-----------+----------+-------------------+ PTV      Partial                                      Acute mid to                                                              proximal            +---------+---------------+---------+-----------+----------+-------------------+  PERO                                                  Not visualized      +---------+---------------+---------+-----------+----------+-------------------+   Right Technical Findings: Not visualized segments include Peroneal. Unable to visualize the peroneal vein. Mild technical difficulty throughout due to acoustic shadowing from calcification of the arteries  +---------+---------------+---------+-----------+----------+------------------+ LEFT     CompressibilityPhasicitySpontaneityPropertiesThrombus Aging     +---------+---------------+---------+-----------+----------+------------------+ CFV      Full           Yes      Yes                                     +---------+---------------+---------+-----------+----------+------------------+ SFJ      Full                                                            +---------+---------------+---------+-----------+----------+------------------+ FV Prox  Full           Yes      Yes                                      +---------+---------------+---------+-----------+----------+------------------+ FV Mid   Full           Yes      Yes                                     +---------+---------------+---------+-----------+----------+------------------+ FV DistalPartial                                      Acute diatal to                                                          mid                +---------+---------------+---------+-----------+----------+------------------+ PFV      Full           Yes      Yes                                     +---------+---------------+---------+-----------+----------+------------------+ POP      Partial                                      Acute              +---------+---------------+---------+-----------+----------+------------------+ PTV      Partial  Acute - Mid to                                                           proximal           +---------+---------------+---------+-----------+----------+------------------+ PERO     Full                                                            +---------+---------------+---------+-----------+----------+------------------+   Left Technical Findings: Difficult to visualize the peroneal veins. Mld technical difficulty due to acoustic shadowing calification of the arteries.   Summary: Right: Findings consistent with acute deep vein thrombosis involving the right popliteal vein, and right posterior tibial veins. See technical findings listed above Left: Findings consistent with acute deep vein thrombosis involving the left posterior tibial veins, left popliteal vein, and left femoral vein. See technical findings listed above.  *See table(s) above for measurements and observations. Electronically signed by Curt Jews MD on 04/08/2019 at 9:47:50 AM.    Final    US Thoracentesis Asp Pleural Space W/img Guide  Result Date:  04/07/2019 INDICATION: Patient with history of adenocarcinoma of right lung, pulmonary embolism of left lung, dyspnea, and right pleural effusion. Request is made for diagnostic and therapeutic right thoracentesis. EXAM: ULTRASOUND GUIDED DIAGNOSTIC AND THERAPEUTIC RIGHT THORACENTESIS MEDICATIONS: 10 mL of 1% lidocaine COMPLICATIONS: None immediate. PROCEDURE: An ultrasound guided thoracentesis was thoroughly discussed with the patient and questions answered. The benefits, risks, alternatives and complications were also discussed. The patient understands and wishes to proceed with the procedure. Written consent was obtained. Ultrasound was performed to localize and mark an adequate pocket of fluid in the right chest. The area was then prepped and draped in the normal sterile fashion. 1% Lidocaine was used for local anesthesia. Under ultrasound guidance a 6 Fr Safe-T-Centesis catheter was introduced. Thoracentesis was performed. The catheter was removed and a dressing applied. FINDINGS: A total of approximately 450 mL of clear yellow fluid was removed. Samples were sent to the laboratory as requested by the clinical team. IMPRESSION: Successful ultrasound guided right thoracentesis yielding 450 mL of pleural fluid. Read by: Earley Abide, PA-C Electronically Signed   By: Aletta Edouard M.D.   On: 04/07/2019 14:04    ASSESSMENT AND PLAN: 1.  Stage IIIa non-small cell lung cancer, adenocarcinoma -now with disease recurrence 2.  Pleural effusions 3.  Pulmonary emboli 4.  Bilateral DVT 5.  Anemia  -I discussed the CT scan findings with the patient today.  I have explained to him that he will follow-up with the cancer center as an outpatient to discuss further treatment options.  He will need some form of systemic chemotherapy +/- immunotherapy. -Follow-up on cytology from pleural effusion. -Continue heparin for pulmonary emboli and bilateral DVT.  Would recommend transitioning the patient to Lovenox 1.5  mg/kg daily if we can get this approved and the patient is agreeable to taking an injection.  If not, Xarelto would be acceptable. -The patient does not have any evidence of iron deficiency and stool for occult blood was negative.  We will also check vitamin B12 and folate RBC in  the morning.  Transfuse packed red blood cells for hemoglobin less than 7.   LOS: 2 days   Mikey Bussing, DNP, AGPCNP-BC, AOCNP 04/09/19

## 2019-04-09 NOTE — Progress Notes (Signed)
ANTICOAGULATION CONSULT NOTE - Follow Up Consult  Pharmacy Consult for Heparin Indication: Acute pulmonary embolus and DVT   No Known Allergies  Patient Measurements: Height: 6\' 2"  (188 cm) Weight: 133 lb 2.5 oz (60.4 kg) IBW/kg (Calculated) : 82.2 Heparin Dosing Weight: Actual Body Weight  Vital Signs: Temp: 97.8 F (36.6 C) (09/28 0501) Temp Source: Oral (09/28 0501) BP: 105/74 (09/28 0501) Pulse Rate: 100 (09/28 0501)  Labs: Recent Labs    04/06/19 2036 04/06/19 2217 04/07/19 0628  04/08/19 0433 04/08/19 1047 04/09/19 0515  HGB 6.8*  --  8.4*  --  8.9*  --  8.4*  HCT 21.0*  --  24.8*  --  27.2*  --  26.1*  PLT 151  --  135*  --  131*  --  146*  APTT  --  34  --   --   --   --   --   LABPROT  --  15.1  --   --   --   --   --   INR  --  1.2  --   --   --   --   --   HEPARINUNFRC  --   --   --    < > 0.46 0.55 0.30  CREATININE 0.38*  --  <0.30*  --  0.35*  --  0.43*  TROPONINIHS 5  --   --   --   --   --   --    < > = values in this interval not displayed.    Estimated Creatinine Clearance: 83.9 mL/min (A) (by C-G formula based on SCr of 0.43 mg/dL (L)).   Assessment: Patient is a 1 YOM recently diagnosed with NSCLC in July 2020. Currently on chemotherapy PTA. Chest CTA done at [facility] on 9/25 showed presence of an acute PE and patient was subsequently referred to the ED for further workup and management. Patient has no history of anticoagulant use or any past DVT/PE. Patient Hgb was low on admission 6.8 and received 1 unit PRBC 9/26 am (0020). Heparin was initiated on admission with no bolus per MD orders due to anemia.   Significant events:  9/26 US thoracentesis: Yielded 450 mL of clear yellow fluid.   9/27 LE dopplers: findings consistent with bilateral LE-DVT  Today, 04/09/19:  Heparin level is therapeutic at 0.30, but is at lower end of goal range with current rate of 1650 units/mL.   Plt and Hgb are low, but both appear to be stable.   No bleeding or  infusion issues, per nursing.   Goal of Therapy:  Heparin level 0.3-0.7 units/ml Monitor platelets by anticoagulation protocol: Yes   Plan:   Slightly increase Heparin drip rate to 1700 units/mL  Follow up with am labs and adjust rate as needed.   Monitor CBC and Heparin level daily.  Monitor closely for s/sx of bleeding.  Neville Route, PharmD Candidate 04/09/2019,7:56 AM

## 2019-04-09 NOTE — Progress Notes (Signed)
PROGRESS NOTE    Tyler [name]  WVP:[id number] DOB: 08/08/1960 DOA: 04/06/2019 PCP: Brand Males, MD    Brief Narrative:  Patient is 58 year old male with history of stage III non-small cell lung cancer, malnutrition, COPD presented to the emergency room with fatigue, generalized weakness, dyspnea on exertion.  Also had chest pain and found to have bilateral PE and bilateral DVT.  Was started on heparin IV and admitted to the hospital.   Assessment & Plan:   Principal Problem:   Pulmonary embolism (HCC) Active Problems:   Protein-calorie malnutrition, severe   Tobacco abuse   Adenocarcinoma of right lung, stage 3 (HCC)   Anemia   Hypokalemia   Acute pulmonary embolism (HCC)   Pleural effusion on right  Acute pulmonary embolism without cor pulmonale, bilateral lower extremity DVT in the setting of active lung cancer: Treated with IV heparin.  Remains hemodynamically stable. No payer source.  Case management involved for outpatient treatment with preferably Lovenox 1.5 mg/kg/day, if unable we will do Xarelto therapeutic dose.  Stage III lung cancer: With evidence of worsening tumor burden, active DVT PE.  Seen by oncology.  They will do outpatient follow-up.  Bilateral pleural effusions: Status post thoracentesis.  Currently on room air.  Cytology pending.  smoker: Counseled to quit.  On nicotine patch.  Hypokalemia/hypomagnesemia: Replaced.  Resolved.  Microcytic anemia: Anemia due to malignancy.  Hemoglobin 7.4-6.8.  Received 1 unit of PRBC.  Currently stable.  No evidence of active bleeding.  N46 folic acid level pending.  His iron levels and ferritin levels were adequate.  Moderate protein calorie malnutrition:Malnutrition Type: Nutrition Problem: Increased nutrient needs Etiology: cancer and cancer related treatments Malnutrition Characteristics: Signs/Symptoms: estimated needs Nutrition Interventions: Interventions: Ensure Enlive (each supplement provides  350kcal and 20 grams of protein), MVI   DVT prophylaxis: Heparin infusion Code Status: Full code Family Communication: None Disposition Plan: Pending home medications.  Anticipate tomorrow.   Consultants:   Heme-onc  Procedures:   Thoracentesis  Antimicrobials:   None   Subjective: Patient seen and examined.  No overnight events.  Objective: Vitals:   04/08/19 2108 04/08/19 2127 04/09/19 0501 04/09/19 1342  BP: 115/84  105/74 104/76  Pulse: (!) 112 93 100 80  Resp: (!) 23  (!) 24   Temp: 98.3 F (36.8 C)  97.8 F (36.6 C) 99.3 F (37.4 C)  TempSrc: Oral  Oral   SpO2: 100%  99% 97%  Weight:      Height:        Intake/Output Summary (Last 24 hours) at 04/09/2019 1613 Last data filed at 04/09/2019 1400 Gross per 24 hour  Intake 1578.07 ml  Output 1895 ml  Net -316.93 ml   Filed Weights   04/06/19 1954 04/07/19 2002  Weight: 61.2 kg 60.4 kg    Examination:  General exam: Appears calm and comfortable , on room air.  Thin and frail. Respiratory system: Clear to auscultation. Respiratory effort normal. Cardiovascular system: S1 & S2 heard, RRR. No JVD, murmurs, rubs, gallops or clicks. No pedal edema. Gastrointestinal system: Abdomen is nondistended, soft and nontender. No organomegaly or masses felt. Normal bowel sounds heard. Central nervous system: Alert and oriented. No focal neurological deficits. Extremities: Symmetric 5 x 5 power. Skin: No rashes, lesions or ulcers Psychiatry: Judgement and insight appear normal. Mood & affect appropriate.     Data Reviewed: I have personally reviewed following labs and imaging studies  CBC: Recent Labs  Lab 04/06/19 1134 04/06/19 2036 04/07/19 2703 04/08/19 5009  04/09/19 0515  WBC 10.7* 9.6 9.7 9.4 10.2  NEUTROABS 7.9*  --   --   --   --   HGB 7.4* 6.8* 8.4* 8.9* 8.4*  HCT 22.2* 21.0* 24.8* 27.2* 26.1*  MCV 77.9* 79.8* 79.7* 81.7 83.9  PLT 149* 151 135* 131* 161*   Basic Metabolic Panel: Recent Labs    Lab 04/06/19 1134 04/06/19 2036 04/06/19 2300 04/07/19 0628 04/07/19 0839 04/08/19 0433 04/09/19 0515  NA 136 132*  --  132*  --  135 134*  K 2.9* 3.0*  --  3.2*  --  4.1 4.4  CL 97* 93*  --  95*  --  102 101  CO2 30 27  --  28  --  23 23  GLUCOSE 61* 91  --  100*  --  83 98  BUN 7 6  --  7  --  7 8  CREATININE 0.54* 0.38*  --  <0.30*  --  0.35* 0.43*  CALCIUM 7.8* 7.8*  --  7.4*  --  7.6* 8.1*  MG  --   --  1.3*  --  1.3*  --   --    GFR: Estimated Creatinine Clearance: 83.9 mL/min (A) (by C-G formula based on SCr of 0.43 mg/dL (L)). Liver Function Tests: Recent Labs  Lab 04/06/19 1134  AST 15  ALT 6  ALKPHOS 85  BILITOT 0.8  PROT 6.1*  ALBUMIN 2.2*   No results for input(s): LIPASE, AMYLASE in the last 168 hours. No results for input(s): AMMONIA in the last 168 hours. Coagulation Profile: Recent Labs  Lab 04/06/19 2217  INR 1.2   Cardiac Enzymes: No results for input(s): CKTOTAL, CKMB, CKMBINDEX, TROPONINI in the last 168 hours. BNP (last 3 results) No results for input(s): PROBNP in the last 8760 hours. HbA1C: No results for input(s): HGBA1C in the last 72 hours. CBG: No results for input(s): GLUCAP in the last 168 hours. Lipid Profile: No results for input(s): CHOL, HDL, LDLCALC, TRIG, CHOLHDL, LDLDIRECT in the last 72 hours. Thyroid Function Tests: No results for input(s): TSH, T4TOTAL, FREET4, T3FREE, THYROIDAB in the last 72 hours. Anemia Panel: Recent Labs    04/07/19 0628  FERRITIN 1,026*  TIBC 199*  IRON 68   Sepsis Labs: No results for input(s): PROCALCITON, LATICACIDVEN in the last 168 hours.  Recent Results (from the past 240 hour(s))  SARS CORONAVIRUS 2 (TAT 6-24 HRS) Nasopharyngeal Nasopharyngeal Swab     Status: None   Collection Time: 04/06/19 10:53 PM   Specimen: Nasopharyngeal Swab  Result Value Ref Range Status   SARS Coronavirus 2 NEGATIVE NEGATIVE Final    Comment: (NOTE) SARS-CoV-2 target nucleic acids are NOT  DETECTED. The SARS-CoV-2 RNA is generally detectable in upper and lower respiratory specimens during the acute phase of infection. Negative results do not preclude SARS-CoV-2 infection, do not rule out co-infections with other pathogens, and should not be used as the sole basis for treatment or other patient management decisions. Negative results must be combined with clinical observations, patient history, and epidemiological information. The expected result is Negative. Fact Sheet for Patients: SugarRoll.be Fact Sheet for Healthcare Providers: https://www.woods-mathews.com/ This test is not yet approved or cleared by the [place] FDA and  has been authorized for detection and/or diagnosis of SARS-CoV-2 by FDA under an Emergency Use Authorization (EUA). This EUA will remain  in effect (meaning this test can be used) for the duration of the COVID-19 declaration under Section 56 4(b)(1) of the Act,  21 U.S.C. section 360bbb-3(b)(1), unless the authorization is terminated or revoked sooner. Performed at [facility], [place] [address], [place], [place] [address]   Culture, body fluid-bottle     Status: None (Preliminary result)   Collection Time: 04/07/19  1:21 PM   Specimen: Fluid  Result Value Ref Range Status   Specimen Description FLUID  Final   Special Requests NONE  Final   Gram Stain   Final    FEW WBC PRESENT,BOTH PMN AND MONONUCLEAR NO ORGANISMS SEEN    Culture   Final    NO GROWTH 2 DAYS Performed at [facility], [place] [address] [place], [place] [address]    Report Status PENDING  Incomplete  Gram stain     Status: None   Collection Time: 04/07/19  1:21 PM   Specimen: Fluid  Result Value Ref Range Status   Specimen Description FLUID  Final   Special Requests NONE  Final   Gram Stain   Final    FEW WBC PRESENT,BOTH PMN AND MONONUCLEAR NO ORGANISMS SEEN Performed at [facility], [address],  [place],  [address]    Report Status 04/09/2019 FINAL  Final         Radiology Studies: Vas [place] Lower Extremity Venous (dvt)  Result Date: 04/08/2019  Lower Venous Study Indications: Pulmonary embolism.  Risk Factors: Confirmed PE Cancer [place] - Lung. Comparison Study: No previous study available Performing Technologist: Toma Copier RVS  Examination Guidelines: A complete evaluation includes B-mode imaging, spectral Doppler, color Doppler, and power Doppler as needed of all accessible portions of each vessel. Bilateral testing is considered an integral part of a complete examination. Limited examinations for reoccurring indications may be performed as noted.  +---------+---------------+---------+-----------+----------+-------------------+  RIGHT     Compressibility Phasicity Spontaneity Properties Thrombus Aging       +---------+---------------+---------+-----------+----------+-------------------+  CFV       Full            Yes       Yes                                         +---------+---------------+---------+-----------+----------+-------------------+  SFJ       Full                                                                  +---------+---------------+---------+-----------+----------+-------------------+  FV Prox   Full            Yes       Yes                                         +---------+---------------+---------+-----------+----------+-------------------+  FV Mid    Full                                                                  +---------+---------------+---------+-----------+----------+-------------------+  FV Distal Full            Yes       Yes                                         +---------+---------------+---------+-----------+----------+-------------------+  PFV       Full            Yes       Yes                                         +---------+---------------+---------+-----------+----------+-------------------+  POP       Partial         Yes       Yes                     Acute -Phasic in                                                                 the proximal                                                                     region.              +---------+---------------+---------+-----------+----------+-------------------+  PTV       Partial                                          Acute mid to                                                                     proximal             +---------+---------------+---------+-----------+----------+-------------------+  PERO                                                       Not visualized       +---------+---------------+---------+-----------+----------+-------------------+   Right Technical Findings: Not visualized segments include Peroneal. Unable to visualize the peroneal vein. Mild technical difficulty throughout due to acoustic shadowing from calcification of the arteries  +---------+---------------+---------+-----------+----------+------------------+  LEFT      Compressibility Phasicity Spontaneity Properties Thrombus Aging      +---------+---------------+---------+-----------+----------+------------------+  CFV       Full            Yes  Yes                                        +---------+---------------+---------+-----------+----------+------------------+  SFJ       Full                                                                 +---------+---------------+---------+-----------+----------+------------------+  FV Prox   Full            Yes       Yes                                        +---------+---------------+---------+-----------+----------+------------------+  FV Mid    Full            Yes       Yes                                        +---------+---------------+---------+-----------+----------+------------------+  FV Distal Partial                                          Acute diatal to                                                                 mid                  +---------+---------------+---------+-----------+----------+------------------+  PFV       Full            Yes       Yes                                        +---------+---------------+---------+-----------+----------+------------------+  POP       Partial                                          Acute               +---------+---------------+---------+-----------+----------+------------------+  PTV       Partial                                          Acute - Mid to  proximal            +---------+---------------+---------+-----------+----------+------------------+  PERO      Full                                                                 +---------+---------------+---------+-----------+----------+------------------+   Left Technical Findings: Difficult to visualize the peroneal veins. Mld technical difficulty due to acoustic shadowing calification of the arteries.   Summary: Right: Findings consistent with acute deep vein thrombosis involving the right popliteal vein, and right posterior tibial veins. See technical findings listed above Left: Findings consistent with acute deep vein thrombosis involving the left posterior tibial veins, left popliteal vein, and left femoral vein. See technical findings listed above.  *See table(s) above for measurements and observations. Electronically signed by Curt Jews MD on 04/08/2019 at 9:47:50 AM.    Final         Scheduled Meds:  feeding supplement (ENSURE ENLIVE)  237 mL Oral BID BM   influenza vac split quadrivalent PF  0.5 mL Intramuscular Tomorrow-1000   potassium chloride  40 mEq Oral Daily   sodium chloride flush  3 mL Intravenous Once   sodium chloride flush  3 mL Intravenous Q12H   sucralfate  1 g Oral TID WC & HS   umeclidinium bromide  1 puff Inhalation Daily   Continuous Infusions:  sodium chloride 10 mL/hr at 04/09/19 1400   heparin 1,700 Units/hr (04/09/19 1556)      LOS: 2 days    Time spent: 30 minutes    Barb Merino, MD [facility] Pager [phone]  If 7PM-7AM, please contact night-coverage [email or web address] Password [facility] 04/09/2019, 4:13 PM

## 2019-04-10 ENCOUNTER — Telehealth: Payer: Self-pay

## 2019-04-10 LAB — CBC
HCT: 25.5 % — ABNORMAL LOW (ref 39.0–52.0)
Hemoglobin: 8.2 g/dL — ABNORMAL LOW (ref 13.0–17.0)
MCH: 26.8 pg (ref 26.0–34.0)
MCHC: 32.2 g/dL (ref 30.0–36.0)
MCV: 83.3 fL (ref 80.0–100.0)
Platelets: 150 10*3/uL (ref 150–400)
RBC: 3.06 MIL/uL — ABNORMAL LOW (ref 4.22–5.81)
WBC: 12.9 10*3/uL — ABNORMAL HIGH (ref 4.0–10.5)
nRBC: 0 % (ref 0.0–0.2)

## 2019-04-10 LAB — BASIC METABOLIC PANEL
Anion gap: 9 (ref 5–15)
BUN: 6 mg/dL (ref 6–20)
CO2: 24 mmol/L (ref 22–32)
Calcium: 8 mg/dL — ABNORMAL LOW (ref 8.9–10.3)
Chloride: 98 mmol/L (ref 98–111)
Creatinine, Ser: 0.46 mg/dL — ABNORMAL LOW (ref 0.61–1.24)
GFR calc Af Amer: >60 mL/min (ref >60)
GFR calc non Af Amer: >60 mL/min (ref >60)
Glucose, Bld: 96 mg/dL (ref 70–99)
Potassium: 4.1 mmol/L (ref 3.5–5.1)
Sodium: 131 mmol/L — ABNORMAL LOW (ref 135–145)

## 2019-04-10 LAB — HEPARIN LEVEL (UNFRACTIONATED): Heparin Unfractionated: 0.43 IU/mL (ref 0.30–0.70)

## 2019-04-10 LAB — CYTOLOGY - NON PAP

## 2019-04-10 LAB — MAGNESIUM: Magnesium: 1.2 mg/dL — ABNORMAL LOW (ref 1.7–2.4)

## 2019-04-10 LAB — VITAMIN B12: Vitamin B-12: 543 pg/mL (ref 180–914)

## 2019-04-10 LAB — PHOSPHORUS: Phosphorus: 2.7 mg/dL (ref 2.5–4.6)

## 2019-04-10 MED ORDER — ADULT MULTIVITAMIN W/MINERALS CH
1.0000 | ORAL_TABLET | Freq: Every day | ORAL | Status: DC
  Administered 2019-04-10: 1 via ORAL
  Filled 2019-04-10: qty 1

## 2019-04-10 MED ORDER — ENOXAPARIN SODIUM 100 MG/ML ~~LOC~~ SOLN
90.0000 mg | SUBCUTANEOUS | Status: DC
  Administered 2019-04-10: 08:00:00 90 mg via SUBCUTANEOUS
  Filled 2019-04-10 (×2): qty 1

## 2019-04-10 MED ORDER — MAGNESIUM OXIDE 400 MG PO CAPS: 400.0000 mg | ORAL_CAPSULE | Freq: Two times a day (BID) | ORAL | 0 refills | Status: AC

## 2019-04-10 MED ORDER — MAGNESIUM SULFATE 2 GM/50ML IV SOLN
2.0000 g | Freq: Once | INTRAVENOUS | Status: AC
  Administered 2019-04-10: 13:00:00 2 g via INTRAVENOUS
  Filled 2019-04-10: qty 50

## 2019-04-10 MED ORDER — ENOXAPARIN (LOVENOX) PATIENT EDUCATION KIT
PACK | Freq: Once | Status: AC
  Administered 2019-04-10: 15:00:00
  Filled 2019-04-10: qty 1

## 2019-04-10 MED ORDER — ENOXAPARIN SODIUM 100 MG/ML ~~LOC~~ SOLN: 90.0000 mg | SUBCUTANEOUS | 0 refills | Status: DC

## 2019-04-10 NOTE — Discharge Summary (Signed)
Physician Discharge Summary  Tyler [name] QPY:[id number] DOB: 1960-10-06 DOA: 04/06/2019  PCP: Brand Males, MD  Admit date: 04/06/2019 Discharge date: 04/10/2019  Admitted From: Home Disposition: Home  Recommendations for Outpatient Follow-up:  1. Follow up with PCP in 1-2 weeks 2. Please obtain BMP/CBC in one week 3. Follow-up with oncology as a scheduled.    Home Health: Not applicable Equipment/Devices: Not applicable  Discharge Condition: Stable CODE STATUS: Full code Diet recommendation: Regular diet, nutritional supplements.  Brief/Interim Summary: Patient is 58 year old male with history of stage III non-small cell lung cancer, malnutrition, COPD presented to the emergency room with fatigue, generalized weakness, dyspnea on exertion.  Also had chest pain and found to have bilateral PE and bilateral DVT.  Was started on heparin IV and admitted to the hospital.  Discharge Diagnoses:  Principal Problem:   Pulmonary embolism ([place] [place]) Active Problems:   Protein-calorie malnutrition, severe   Tobacco abuse   Adenocarcinoma of right lung, stage 3 (HCC)   Anemia   Hypokalemia   Acute pulmonary embolism (HCC)   Pleural effusion on right  Acute pulmonary embolism without cor pulmonale, bilateral lower extremity DVT in the setting of active lung cancer: Remains hemodynamically stable.  He was treated with heparin and now converted to Lovenox. With history of active cancer, Lovenox will be best treatment for his widespread thromboembolism. Patient learning self injections, Lovenox provided from hospital charity. His hemoglobin was 6.8 on presentation, no active bleeding, iron levels normal.  T24 and folic acid levels pending.  Received 1 unit of PRBC and has remained stable since then.  Stage III lung cancer: With evidence of worsening tumor burden on CT scan of the lungs.  Seen by oncology team.  They will schedule a follow-up with him in the office to discuss further  treatment options. Patient had thoracentesis for pleural effusion, cytology is pending.  No infection.  Hypokalemia: Potassium was replaced and improved.  His magnesium is 1.2 today.  He will receive 2 g IV magnesium before discharge as well as he will be discharged on a scheduled magnesium.  Patient is on room air.  He is not in any obvious distress.  Has been learning to inject subcu Lovenox.  Once Lovenox arrangements are made, he can go home.  Discharge Instructions  Discharge Instructions    Diet - low sodium heart healthy   Complete by: As directed    Discharge instructions   Complete by: As directed    You can use over the counter cough medicine. Familiarize yourself with injection to use at home   Increase activity slowly   Complete by: As directed      Allergies as of 04/10/2019   No Known Allergies     Medication List    STOP taking these medications   prochlorperazine 10 MG tablet Commonly known as: COMPAZINE   sucralfate 1 g tablet Commonly known as: Carafate     TAKE these medications   albuterol 108 (90 Base) MCG/ACT inhaler Commonly known as: VENTOLIN HFA Inhale 2 puffs into the lungs every 6 (six) hours as needed for wheezing or shortness of breath.   enoxaparin 100 MG/ML injection Commonly known as: LOVENOX Inject 0.9 mLs (90 mg total) into the skin daily. Start taking on: April 11, 2019   feeding supplement (ENSURE ENLIVE) Liqd Take 237 mLs by mouth 3 (three) times daily between meals. What changed: when to take this   lidocaine 2 % solution Commonly known as: XYLOCAINE 5 ml swallow before  meals for radiation esophagitis   Magnesium Oxide 400 MG Caps Take 1 capsule (400 mg total) by mouth 2 (two) times daily.   potassium chloride SA 20 MEQ tablet Commonly known as: K-DUR Take 1 tablet (20 mEq total) by mouth daily.   tiotropium 18 MCG inhalation capsule Commonly known as: Spiriva HandiHaler Place 1 capsule (18 mcg total) into inhaler  and inhale daily.       No Known Allergies  Consultations:  Hematology oncology   Procedures/Studies: Dg Chest 2 View  Result Date: 04/06/2019 CLINICAL DATA:  Chest pain. EXAM: CHEST - 2 VIEW COMPARISON:  Radiograph of January 11, 2019. FINDINGS: The heart size and mediastinal contours are within normal limits. No pneumothorax is noted. Left lung is clear. Probable lower lobe atelectasis or pneumonia is noted with small right pleural effusion. The visualized skeletal structures are unremarkable. IMPRESSION: Right lower lobe opacity is noted most consistent with atelectasis or infiltrate with associated pleural effusion. Followup PA and lateral chest X-ray is recommended in 3-4 weeks following trial of antibiotic therapy to ensure resolution and exclude underlying malignancy. Electronically Signed   By: Marijo Conception M.D.   On: 04/06/2019 20:19   Ct Chest W Contrast  Addendum Date: 04/06/2019   ADDENDUM REPORT: 04/06/2019 16:44 ADDENDUM: Critical Value/emergent results were called by telephone at the time of interpretation on 04/06/2019 at 4:43 pm to the [facility] triage nurse Elmer Picker, who verbally acknowledged these results. Electronically Signed   By: Kerby Moors M.D.   On: 04/06/2019 16:44   Result Date: 04/06/2019 CLINICAL DATA:  Restaging right lung cancer. Right supraclavicular pain and cough. EXAM: CT CHEST WITH CONTRAST TECHNIQUE: Multidetector CT imaging of the chest was performed during intravenous contrast administration. CONTRAST:  42mL OMNIPAQUE IOHEXOL 300 MG/ML  SOLN COMPARISON:  CT chest 01/08/2019 and PET-CT 12/27/2018 FINDINGS: Cardiovascular: Normal heart size. No pericardial effusion. Mild aortic atherosclerosis. RCA and left circumflex coronary artery calcifications. There is a left upper lobe segmental branch pulmonary artery filling defect, image 76/2. This is compatible with acute pulmonary embolus. Additional PE identified within a  segmental branch of the left lower lobe pulmonary artery, image 101/2 Mediastinum/Nodes: Normal appearance of the thyroid gland. The trachea appears patent and is midline. Normal appearance of the esophagus. No mediastinal or left hilar adenopathy. 1 cm right hilar lymph node is identified. Increased from 0.7 cm previously. Lungs/Pleura: Centrilobular and paraseptal emphysema. New moderate right pleural effusion and small left pleural effusion. Significant interval increase in tumor burden within the right lower lobe which measures 8.6 x 6.7 by 6.4 cm, image 112/2 and image 52/7. On the sagittal images this has a AP diameter 9.7 cm spanning the right lower lobe and right middle lobe. Adjacent ground-glass attenuation and interlobular septal thickening noted concerning for lymphangitic spread. On the previous exam this lesion was limited to the central right lower lobe measuring 1.9 x 2.5 by 1.5 cm. Tumor extension into the right infrahilar region with encasement of the right lower lobe and right middle lobe bronchi identified with postobstructive pneumonitis. New tumor within the central right middle lobe measures 4.4 x 2.5 by 5.4 cm, image 117/2 and image 25/6. Solid nodule within the right upper lobe measures 8 mm, image 31/5. Unchanged from previous exam. Thin walled cavitary nodule within the posterior left upper lobe measures 4 mm, image 43/5. Previously this measured 6 mm. Unchanged, ill-defined part solid nodule in the lateral aspect of the superior segment of left lower lobe  measuring 1 cm, image 78/5. Triangular-shaped, subpleural nodule in the posterior left lower lobe is new measuring 1.4 cm and may represent an area of pulmonary infarct is this is in the distribution of the left lower lobe pulmonary embolus. Upper Abdomen: No acute abnormality. Musculoskeletal: No acute or aggressive bone lesions identified. IMPRESSION: 1. Considerable increase in tumor burden involving the left lower lobe and now the  right middle lobe. There is overlying ground-glass attenuation and interlobular septal thickening concerning for lymphangitic spread of disease. 2. New bilateral pleural effusions right greater than left. 3. Additional small solid and sub solid nodules involving the right upper lobe and left lung are not significantly changed in the interval. 4. Left upper lobe and left lower lobe segmental branch acute pulmonary artery emboli. Electronically Signed: By: Kerby Moors M.D. On: 04/06/2019 15:47   Dg Chest Port 1 View  Result Date: 04/07/2019 CLINICAL DATA:  Status post right thoracentesis. EXAM: PORTABLE CHEST 1 VIEW COMPARISON:  Film earlier today at 0800 hours FINDINGS: In the frontal projection there appears to be complete evacuation of right pleural fluid. Underlying atelectasis/consolidation at the right lung base remains. No pneumothorax or pulmonary edema. Stable cardiac enlargement. IMPRESSION: No pneumothorax after right thoracentesis with complete evacuation of right pleural fluid. Underlying atelectasis and consolidation remains at the right lung base. Electronically Signed   By: Aletta Edouard M.D.   On: 04/07/2019 13:59   Vas [place] Lower Extremity Venous (dvt)  Result Date: 04/08/2019  Lower Venous Study Indications: Pulmonary embolism.  Risk Factors: Confirmed PE Cancer [place] - Lung. Comparison Study: No previous study available Performing Technologist: Toma Copier RVS  Examination Guidelines: A complete evaluation includes B-mode imaging, spectral Doppler, color Doppler, and power Doppler as needed of all accessible portions of each vessel. Bilateral testing is considered an integral part of a complete examination. Limited examinations for reoccurring indications may be performed as noted.  +---------+---------------+---------+-----------+----------+-------------------+ RIGHT    CompressibilityPhasicitySpontaneityPropertiesThrombus Aging       +---------+---------------+---------+-----------+----------+-------------------+ CFV      Full           Yes      Yes                                      +---------+---------------+---------+-----------+----------+-------------------+ SFJ      Full                                                             +---------+---------------+---------+-----------+----------+-------------------+ FV Prox  Full           Yes      Yes                                      +---------+---------------+---------+-----------+----------+-------------------+ FV Mid   Full                                                             +---------+---------------+---------+-----------+----------+-------------------+ FV DistalFull  Yes      Yes                                      +---------+---------------+---------+-----------+----------+-------------------+ PFV      Full           Yes      Yes                                      +---------+---------------+---------+-----------+----------+-------------------+ POP      Partial        Yes      Yes                  Acute -Phasic in                                                          the proximal                                                              region.             +---------+---------------+---------+-----------+----------+-------------------+ PTV      Partial                                      Acute mid to                                                              proximal            +---------+---------------+---------+-----------+----------+-------------------+ PERO                                                  Not visualized      +---------+---------------+---------+-----------+----------+-------------------+   Right Technical Findings: Not visualized segments include Peroneal. Unable to visualize the peroneal vein. Mild technical difficulty throughout due to acoustic  shadowing from calcification of the arteries  +---------+---------------+---------+-----------+----------+------------------+ LEFT     CompressibilityPhasicitySpontaneityPropertiesThrombus Aging     +---------+---------------+---------+-----------+----------+------------------+ CFV      Full           Yes      Yes                                     +---------+---------------+---------+-----------+----------+------------------+ SFJ      Full                                                            +---------+---------------+---------+-----------+----------+------------------+  FV Prox  Full           Yes      Yes                                     +---------+---------------+---------+-----------+----------+------------------+ FV Mid   Full           Yes      Yes                                     +---------+---------------+---------+-----------+----------+------------------+ FV DistalPartial                                      Acute diatal to                                                          mid                +---------+---------------+---------+-----------+----------+------------------+ PFV      Full           Yes      Yes                                     +---------+---------------+---------+-----------+----------+------------------+ POP      Partial                                      Acute              +---------+---------------+---------+-----------+----------+------------------+ PTV      Partial                                      Acute - Mid to                                                           proximal           +---------+---------------+---------+-----------+----------+------------------+ PERO     Full                                                            +---------+---------------+---------+-----------+----------+------------------+   Left Technical Findings: Difficult to visualize the peroneal  veins. Mld technical difficulty due to acoustic shadowing calification of the arteries.   Summary: Right: Findings consistent with acute deep vein thrombosis involving the right popliteal vein, and right posterior tibial veins. See technical findings listed above Left: Findings consistent with acute deep vein thrombosis involving the left posterior tibial veins,  left popliteal vein, and left femoral vein. See technical findings listed above.  *See table(s) above for measurements and observations. Electronically signed by Curt Jews MD on 04/08/2019 at 9:47:50 AM.    Final    US Thoracentesis Asp Pleural Space W/img Guide  Result Date: 04/07/2019 INDICATION: Patient with history of adenocarcinoma of right lung, pulmonary embolism of left lung, dyspnea, and right pleural effusion. Request is made for diagnostic and therapeutic right thoracentesis. EXAM: ULTRASOUND GUIDED DIAGNOSTIC AND THERAPEUTIC RIGHT THORACENTESIS MEDICATIONS: 10 mL of 1% lidocaine COMPLICATIONS: None immediate. PROCEDURE: An ultrasound guided thoracentesis was thoroughly discussed with the patient and questions answered. The benefits, risks, alternatives and complications were also discussed. The patient understands and wishes to proceed with the procedure. Written consent was obtained. Ultrasound was performed to localize and mark an adequate pocket of fluid in the right chest. The area was then prepped and draped in the normal sterile fashion. 1% Lidocaine was used for local anesthesia. Under ultrasound guidance a 6 Fr Safe-T-Centesis catheter was introduced. Thoracentesis was performed. The catheter was removed and a dressing applied. FINDINGS: A total of approximately 450 mL of clear yellow fluid was removed. Samples were sent to the laboratory as requested by the clinical team. IMPRESSION: Successful ultrasound guided right thoracentesis yielding 450 mL of pleural fluid. Read by: Earley Abide, PA-C Electronically Signed   By: Aletta Edouard M.D.   On: 04/07/2019 14:04     Subjective: Patient seen and examined.  No overnight events.  Had some dry cough, no sputum production.  No hemoptysis.  Appetite is okay, however is not gaining any weight.   Discharge Exam: Vitals:   04/09/19 2131 04/10/19 0442  BP: 111/74 117/85  Pulse: 64   Resp: 18   Temp: 99.8 F (37.7 C) 98.7 F (37.1 C)  SpO2: 98%    Vitals:   04/09/19 0501 04/09/19 1342 04/09/19 2131 04/10/19 0442  BP: 105/74 104/76 111/74 117/85  Pulse: 100 80 64   Resp: (!) 24  18   Temp: 97.8 F (36.6 C) 99.3 F (37.4 C) 99.8 F (37.7 C) 98.7 F (37.1 C)  TempSrc: Oral  Oral Oral  SpO2: 99% 97% 98%   Weight:      Height:        General: Pt is alert, awake, not in acute distress, thin and frail.  On room air.  Walking along the hallway. Cardiovascular: RRR, S1/S2 +, no rubs, no gallops Respiratory: CTA bilaterally, no wheezing, no rhonchi Abdominal: Soft, NT, ND, bowel sounds + Extremities: no edema, no cyanosis    The results of significant diagnostics from this hospitalization (including imaging, microbiology, ancillary and laboratory) are listed below for reference.     Microbiology: Recent Results (from the past 240 hour(s))  SARS CORONAVIRUS 2 (TAT 6-24 HRS) Nasopharyngeal Nasopharyngeal Swab     Status: None   Collection Time: 04/06/19 10:53 PM   Specimen: Nasopharyngeal Swab  Result Value Ref Range Status   SARS Coronavirus 2 NEGATIVE NEGATIVE Final    Comment: (NOTE) SARS-CoV-2 target nucleic acids are NOT DETECTED. The SARS-CoV-2 RNA is generally detectable in upper and lower respiratory specimens during the acute phase of infection. Negative results do not preclude SARS-CoV-2 infection, do not rule out co-infections with other pathogens, and should not be used as the sole basis for treatment or other patient management decisions. Negative results must be combined with clinical observations, patient history, and epidemiological  information. The expected result is Negative. Fact Sheet for Patients:  SugarRoll.be Fact Sheet for Healthcare Providers: https://www.woods-mathews.com/ This test is not yet approved or cleared by the [place] FDA and  has been authorized for detection and/or diagnosis of SARS-CoV-2 by FDA under an Emergency Use Authorization (EUA). This EUA will remain  in effect (meaning this test can be used) for the duration of the COVID-19 declaration under Section 56 4(b)(1) of the Act, 21 U.S.C. section 360bbb-3(b)(1), unless the authorization is terminated or revoked sooner. Performed at [facility], [place] [address], [place], [place] [address]   Culture, body fluid-bottle     Status: None (Preliminary result)   Collection Time: 04/07/19  1:21 PM   Specimen: Fluid  Result Value Ref Range Status   Specimen Description FLUID  Final   Special Requests NONE  Final   Gram Stain   Final    FEW WBC PRESENT,BOTH PMN AND MONONUCLEAR NO ORGANISMS SEEN    Culture   Final    NO GROWTH 3 DAYS Performed at [facility], [place] [address], [place], [place] [address]    Report Status PENDING  Incomplete  Gram stain     Status: None   Collection Time: 04/07/19  1:21 PM   Specimen: Fluid  Result Value Ref Range Status   Specimen Description FLUID  Final   Special Requests NONE  Final   Gram Stain   Final    FEW WBC PRESENT,BOTH PMN AND MONONUCLEAR NO ORGANISMS SEEN Performed at [facility], [address] [place], [place] [address]    Report Status 04/09/2019 FINAL  Final     Labs: BNP (last 3 results) Recent Labs    04/06/19 2217  BNP 694.5*   Basic Metabolic Panel: Recent Labs  Lab 04/06/19 2036 04/06/19 2300 04/07/19 0628 04/07/19 0839 04/08/19 0433 04/09/19 0515 04/10/19 0450  NA 132*  --  132*  --  135 134* 131*  K 3.0*  --  3.2*  --  4.1 4.4 4.1  CL 93*  --  95*  --  102 101 98  CO2 27  --  28  --  23 23 24    GLUCOSE 91  --  100*  --  83 98 96  BUN 6  --  7  --  7 8 6   CREATININE 0.38*  --  <0.30*  --  0.35* 0.43* 0.46*  CALCIUM 7.8*  --  7.4*  --  7.6* 8.1* 8.0*  MG  --  1.3*  --  1.3*  --   --  1.2*  PHOS  --   --   --   --   --   --  2.7   Liver Function Tests: Recent Labs  Lab 04/06/19 1134  AST 15  ALT 6  ALKPHOS 85  BILITOT 0.8  PROT 6.1*  ALBUMIN 2.2*   No results for input(s): LIPASE, AMYLASE in the last 168 hours. No results for input(s): AMMONIA in the last 168 hours. CBC: Recent Labs  Lab 04/06/19 1134 04/06/19 2036 04/07/19 0628 04/08/19 0433 04/09/19 0515 04/10/19 0450  WBC 10.7* 9.6 9.7 9.4 10.2 12.9*  NEUTROABS 7.9*  --   --   --   --   --   HGB 7.4* 6.8* 8.4* 8.9* 8.4* 8.2*  HCT 22.2* 21.0* 24.8* 27.2* 26.1* 25.5*  MCV 77.9* 79.8* 79.7* 81.7 83.9 83.3  PLT 149* 151 135* 131* 146* 150   Cardiac Enzymes: No results for input(s): CKTOTAL, CKMB, CKMBINDEX, TROPONINI in the last 168  hours. BNP: Invalid input(s): POCBNP CBG: No results for input(s): GLUCAP in the last 168 hours. D-Dimer No results for input(s): DDIMER in the last 72 hours. Hgb A1c No results for input(s): HGBA1C in the last 72 hours. Lipid Profile No results for input(s): CHOL, HDL, LDLCALC, TRIG, CHOLHDL, LDLDIRECT in the last 72 hours. Thyroid function studies No results for input(s): TSH, T4TOTAL, T3FREE, THYROIDAB in the last 72 hours.  Invalid input(s): FREET3 Anemia work up Recent Labs    04/10/19 0450  VITAMINB12 543   Urinalysis    Component Value Date/Time   COLORURINE YELLOW 08/03/2017 [place] 08/03/2017 1755   LABSPEC 1.019 08/03/2017 1755   PHURINE 5.0 08/03/2017 1755   GLUCOSEU NEGATIVE 08/03/2017 1755   HGBUR MODERATE (A) 08/03/2017 [place] 08/03/2017 1755   KETONESUR NEGATIVE 08/03/2017 1755   PROTEINUR 100 (A) 08/03/2017 1755   NITRITE NEGATIVE 08/03/2017 1755   LEUKOCYTESUR NEGATIVE 08/03/2017 1755   Sepsis  Labs Invalid input(s): PROCALCITONIN,  WBC,  LACTICIDVEN Microbiology Recent Results (from the past 240 hour(s))  SARS CORONAVIRUS 2 (TAT 6-24 HRS) Nasopharyngeal Nasopharyngeal Swab     Status: None   Collection Time: 04/06/19 10:53 PM   Specimen: Nasopharyngeal Swab  Result Value Ref Range Status   SARS Coronavirus 2 NEGATIVE NEGATIVE Final    Comment: (NOTE) SARS-CoV-2 target nucleic acids are NOT DETECTED. The SARS-CoV-2 RNA is generally detectable in upper and lower respiratory specimens during the acute phase of infection. Negative results do not preclude SARS-CoV-2 infection, do not rule out co-infections with other pathogens, and should not be used as the sole basis for treatment or other patient management decisions. Negative results must be combined with clinical observations, patient history, and epidemiological information. The expected result is Negative. Fact Sheet for Patients: SugarRoll.be Fact Sheet for Healthcare Providers: https://www.woods-mathews.com/ This test is not yet approved or cleared by the [place] FDA and  has been authorized for detection and/or diagnosis of SARS-CoV-2 by FDA under an Emergency Use Authorization (EUA). This EUA will remain  in effect (meaning this test can be used) for the duration of the COVID-19 declaration under Section 56 4(b)(1) of the Act, 21 U.S.C. section 360bbb-3(b)(1), unless the authorization is terminated or revoked sooner. Performed at [facility], [place] [address] [place], [place] [address]   Culture, body fluid-bottle     Status: None (Preliminary result)   Collection Time: 04/07/19  1:21 PM   Specimen: Fluid  Result Value Ref Range Status   Specimen Description FLUID  Final   Special Requests NONE  Final   Gram Stain   Final    FEW WBC PRESENT,BOTH PMN AND MONONUCLEAR NO ORGANISMS SEEN    Culture   Final    NO GROWTH 3 DAYS Performed at [facility], [place] [address] [place], [place] [address]    Report Status PENDING  Incomplete  Gram stain     Status: None   Collection Time: 04/07/19  1:21 PM   Specimen: Fluid  Result Value Ref Range Status   Specimen Description FLUID  Final   Special Requests NONE  Final   Gram Stain   Final    FEW WBC PRESENT,BOTH PMN AND MONONUCLEAR NO ORGANISMS SEEN Performed at [facility], [address], [place], [place] [address]    Report Status 04/09/2019 FINAL  Final     Time coordinating discharge: 32 minutes  SIGNED:   Barb Merino, MD  [facility] 04/10/2019, 12:01 PM

## 2019-04-10 NOTE — TOC Transition Note (Signed)
Transition of Care [facility]) - CM/SW Discharge Note   Patient Details  Name: Tyler [name] MRN: [id number] Date of Birth: 04-02-1961  Transition of Care [facility]) CM/SW Contact:  Dessa Phi, RN Phone Number: 04/10/2019, 1:56 PM   Clinical Narrative:Patient script @ [name] pharmacy-free for 1x asst for lovenox. Nsg will confirm patient able to inject safely. Patient already follows with [facility] for pcp. No further CM needs.       Final next level of care: Home/Self Care Barriers to Discharge: No Barriers Identified   Patient Goals and CMS Choice        Discharge Placement                       Discharge Plan and Services                                     Social Determinants of Health (SDOH) Interventions     Readmission Risk Interventions No flowsheet data found.

## 2019-04-10 NOTE — Progress Notes (Signed)
Patient educated on how to inject himself with Lovenox. Return demonstrated on a brownie since he already had his dose for today. Patient verbalizes understanding and has no question. Patient understands to go to the [facility] to get his medications.

## 2019-04-10 NOTE — Telephone Encounter (Signed)
Call received from Dessa Phi, RN CM wanting to confirm [facility] received order for lovenox.  She was requesting one time free fill.  Informed her that as per Kelly/Pharmacy Tech. The order has been filled. They have lovenox samples, no charge for patient at this time. He will not need to use his one time free fill at [place] and will need to apply for prescription assistance program at [place] for further refills if he is not able to afford the cost of the medication in the future.

## 2019-04-11 LAB — FOLATE RBC
Folate, Hemolysate: 301 ng/mL
Folate, RBC: 1158 ng/mL (ref >498)
Hematocrit: 26 % — ABNORMAL LOW (ref 37.5–51.0)

## 2019-04-12 ENCOUNTER — Inpatient Hospital Stay: Payer: Medicaid Other

## 2019-04-12 ENCOUNTER — Encounter: Payer: Self-pay | Admitting: Internal Medicine

## 2019-04-12 ENCOUNTER — Inpatient Hospital Stay: Payer: Medicaid Other | Attending: Internal Medicine | Admitting: Internal Medicine

## 2019-04-12 ENCOUNTER — Other Ambulatory Visit: Payer: Self-pay

## 2019-04-12 VITALS — BP 109/69 | HR 80 | Temp 98.3°F | Resp 18 | Ht 74.0 in | Wt 127.8 lb

## 2019-04-12 DIAGNOSIS — J90 Pleural effusion, not elsewhere classified: Secondary | ICD-10-CM | POA: Insufficient documentation

## 2019-04-12 DIAGNOSIS — Z7189 Other specified counseling: Secondary | ICD-10-CM | POA: Diagnosis not present

## 2019-04-12 DIAGNOSIS — Z79899 Other long term (current) drug therapy: Secondary | ICD-10-CM | POA: Insufficient documentation

## 2019-04-12 DIAGNOSIS — C3491 Malignant neoplasm of unspecified part of right bronchus or lung: Secondary | ICD-10-CM

## 2019-04-12 DIAGNOSIS — C3431 Malignant neoplasm of lower lobe, right bronchus or lung: Secondary | ICD-10-CM | POA: Insufficient documentation

## 2019-04-12 DIAGNOSIS — Z7901 Long term (current) use of anticoagulants: Secondary | ICD-10-CM | POA: Diagnosis not present

## 2019-04-12 DIAGNOSIS — J449 Chronic obstructive pulmonary disease, unspecified: Secondary | ICD-10-CM | POA: Diagnosis not present

## 2019-04-12 DIAGNOSIS — I2699 Other pulmonary embolism without acute cor pulmonale: Secondary | ICD-10-CM | POA: Insufficient documentation

## 2019-04-12 DIAGNOSIS — R1310 Dysphagia, unspecified: Secondary | ICD-10-CM | POA: Insufficient documentation

## 2019-04-12 DIAGNOSIS — Z5111 Encounter for antineoplastic chemotherapy: Secondary | ICD-10-CM

## 2019-04-12 DIAGNOSIS — Z720 Tobacco use: Secondary | ICD-10-CM | POA: Insufficient documentation

## 2019-04-12 LAB — CULTURE, BODY FLUID W GRAM STAIN -BOTTLE: Culture: NO GROWTH

## 2019-04-12 NOTE — Progress Notes (Signed)
[place] Telephone:([phone]   Fax:([phone]) [phone]  OFFICE PROGRESS NOTE  Brand Males, MD [place] [address]  DIAGNOSIS: stage IIIa (T1b, and 2, M0) non-small cell lung cancer, adenocarcinoma diagnosed in July 2020.  This could be stage IV disease if the bilateral pulmonary nodules are consistent with malignancy but they are below the detection level for a PET scan.  He presented with right lower lobe pulmonary nodule in addition to subcarinal lymphadenopathy.  PRIOR THERAPY: A course of concurrent chemoradiation with weekly carboplatin for AUC of 2 and paclitaxel 45 mg/M2.  Status post 7 cycles.  Last dose was given on March 12, 2019.  Unfortunately patient has evidence for disease progression after this treatment.  CURRENT THERAPY: None  INTERVAL HISTORY: Tyler [name] 58 y.o. male returns to the clinic today for follow-up visit.  The patient is feeling fine today with no concerning complaints except for fatigue as well as shortness of breath with exertion.  He was recently admitted to [facility] after his CT scan of the chest showed evidence for pulmonary embolism and the patient had significant hypokalemia as well as shortness of breath and fatigue.  He underwent ultrasound-guided thoracentesis on 04/07/2019 with drainage of 450 mL of pleural fluid.  The final cytology was negative for malignancy. He is feeling much better now.  He denied having any chest pain but continues to have shortness of breath with exertion with mild cough and no hemoptysis.  He denied having any fever or chills.  He has no weight loss or night sweats.  He has no nausea, vomiting, diarrhea or constipation.  Is here today for evaluation and recommendation regarding treatment of his condition.   MEDICAL HISTORY: Past Medical History:  Diagnosis Date  . Alcohol abuse   . Chronic back pain   . COPD (chronic obstructive pulmonary disease) ([place])    . Dyspnea   . Lung mass    mediastinal adenopathy  . Lung nodule   . Multiple pulmonary nodules 04/02/2018  . Pneumonia   . Right lower lobe pulmonary nodule 04/02/2018   12/01/2017: PET SUV 2.1  . Tobacco abuse     ALLERGIES:  has No Known Allergies.  MEDICATIONS:  Current Outpatient Medications  Medication Sig Dispense Refill  . albuterol (PROVENTIL HFA;VENTOLIN HFA) 108 (90 Base) MCG/ACT inhaler Inhale 2 puffs into the lungs every 6 (six) hours as needed for wheezing or shortness of breath. 1 Inhaler 3  . enoxaparin (LOVENOX) 100 MG/ML injection Inject 0.9 mLs (90 mg total) into the skin daily. 27 mL 0  . feeding supplement, ENSURE ENLIVE, (ENSURE ENLIVE) LIQD Take 237 mLs by mouth 3 (three) times daily between meals. (Patient taking differently: Take 237 mLs by mouth daily. ) 90 Bottle 0  . lidocaine (XYLOCAINE) 2 % solution 5 ml swallow before meals for radiation esophagitis 200 mL 0  . Magnesium Oxide 400 MG CAPS Take 1 capsule (400 mg total) by mouth 2 (two) times daily. 60 capsule 0  . potassium chloride SA (K-DUR) 20 MEQ tablet Take 1 tablet (20 mEq total) by mouth daily. 30 tablet 0  . tiotropium (SPIRIVA HANDIHALER) 18 MCG inhalation capsule Place 1 capsule (18 mcg total) into inhaler and inhale daily. 90 capsule 3   No current facility-administered medications for this visit.     SURGICAL HISTORY:  Past Surgical History:  Procedure Laterality Date  . APPENDECTOMY    . VIDEO BRONCHOSCOPY  WITH ENDOBRONCHIAL NAVIGATION N/A 04/05/2018   Procedure: VIDEO BRONCHOSCOPY WITH ENDOBRONCHIAL NAVIGATION;  Surgeon: Garner Nash, DO;  Location: [facility];  Service: Thoracic;  Laterality: N/A;  . VIDEO BRONCHOSCOPY WITH ENDOBRONCHIAL NAVIGATION N/A 01/11/2019   Procedure: VIDEO BRONCHOSCOPY WITH ENDOBRONCHIAL NAVIGATION;  Surgeon: Garner Nash, DO;  Location: [facility];  Service: Thoracic;  Laterality: N/A;  . VIDEO BRONCHOSCOPY WITH ENDOBRONCHIAL ULTRASOUND N/A 01/11/2019   Procedure:  VIDEO BRONCHOSCOPY WITH ENDOBRONCHIAL ULTRASOUND;  Surgeon: Garner Nash, DO;  Location: [facility];  Service: Thoracic;  Laterality: N/A;  . VIDEO BRONCHOSCOPY WITH RADIAL ENDOBRONCHIAL ULTRASOUND N/A 01/11/2019   Procedure: VIDEO BRONCHOSCOPY WITH RADIAL ENDOBRONCHIAL ULTRASOUND;  Surgeon: Garner Nash, DO;  Location: [facility];  Service: Thoracic;  Laterality: N/A;  . WISDOM TOOTH EXTRACTION      REVIEW OF SYSTEMS:  Constitutional: positive for fatigue Eyes: negative Ears, nose, mouth, throat, and face: negative Respiratory: positive for cough and dyspnea on exertion Cardiovascular: negative Gastrointestinal: negative Genitourinary:negative Integument/breast: negative Hematologic/lymphatic: negative Musculoskeletal:negative Neurological: negative Behavioral/Psych: negative Endocrine: negative Allergic/Immunologic: negative   PHYSICAL EXAMINATION: General appearance: alert, cooperative, fatigued and no distress Head: Normocephalic, without obvious abnormality, atraumatic Neck: no adenopathy, no JVD, supple, symmetrical, trachea midline and thyroid not enlarged, symmetric, no tenderness/mass/nodules Lymph nodes: Cervical, supraclavicular, and axillary nodes normal. Resp: clear to auscultation bilaterally Back: symmetric, no curvature. ROM normal. No CVA tenderness. Cardio: regular rate and rhythm, S1, S2 normal, no murmur, click, rub or gallop GI: soft, non-tender; bowel sounds normal; no masses,  no organomegaly Extremities: extremities normal, atraumatic, no cyanosis or edema Neurologic: Alert and oriented X 3, normal strength and tone. Normal symmetric reflexes. Normal coordination and gait  ECOG PERFORMANCE STATUS: 1 - Symptomatic but completely ambulatory  Blood pressure 109/69, pulse 80, temperature 98.3 F (36.8 C), temperature source Temporal, resp. rate 18, height 6\' 2"  (1.88 m), weight 127 lb 12.8 oz (58 kg), SpO2 98 %.  LABORATORY DATA: Lab Results  Component Value  Date   WBC 12.9 (H) 04/10/2019   HGB 8.2 (L) 04/10/2019   HCT 25.5 (L) 04/10/2019   HCT 26.0 (L) 04/10/2019   MCV 83.3 04/10/2019   PLT 150 04/10/2019      Chemistry      Component Value Date/Time   NA 131 (L) 04/10/2019 0450   K 4.1 04/10/2019 0450   CL 98 04/10/2019 0450   CO2 24 04/10/2019 0450   BUN 6 04/10/2019 0450   CREATININE 0.46 (L) 04/10/2019 0450   CREATININE 0.54 (L) 04/06/2019 1134      Component Value Date/Time   CALCIUM 8.0 (L) 04/10/2019 0450   ALKPHOS 85 04/06/2019 1134   AST 15 04/06/2019 1134   ALT 6 04/06/2019 1134   BILITOT 0.8 04/06/2019 1134       RADIOGRAPHIC STUDIES: Dg Chest 2 View  Result Date: 04/06/2019 CLINICAL DATA:  Chest pain. EXAM: CHEST - 2 VIEW COMPARISON:  Radiograph of January 11, 2019. FINDINGS: The heart size and mediastinal contours are within normal limits. No pneumothorax is noted. Left lung is clear. Probable lower lobe atelectasis or pneumonia is noted with small right pleural effusion. The visualized skeletal structures are unremarkable. IMPRESSION: Right lower lobe opacity is noted most consistent with atelectasis or infiltrate with associated pleural effusion. Followup PA and lateral chest X-ray is recommended in 3-4 weeks following trial of antibiotic therapy to ensure resolution and exclude underlying malignancy. Electronically Signed   By: Marijo Conception M.D.   On: 04/06/2019 20:19  Ct Chest W Contrast  Addendum Date: 04/06/2019   ADDENDUM REPORT: 04/06/2019 16:44 ADDENDUM: Critical Value/emergent results were called by telephone at the time of interpretation on 04/06/2019 at 4:43 pm to the [facility] triage nurse Elmer Picker, who verbally acknowledged these results. Electronically Signed   By: Kerby Moors M.D.   On: 04/06/2019 16:44   Result Date: 04/06/2019 CLINICAL DATA:  Restaging right lung cancer. Right supraclavicular pain and cough. EXAM: CT CHEST WITH CONTRAST TECHNIQUE: Multidetector CT  imaging of the chest was performed during intravenous contrast administration. CONTRAST:  23mL OMNIPAQUE IOHEXOL 300 MG/ML  SOLN COMPARISON:  CT chest 01/08/2019 and PET-CT 12/27/2018 FINDINGS: Cardiovascular: Normal heart size. No pericardial effusion. Mild aortic atherosclerosis. RCA and left circumflex coronary artery calcifications. There is a left upper lobe segmental branch pulmonary artery filling defect, image 76/2. This is compatible with acute pulmonary embolus. Additional PE identified within a segmental branch of the left lower lobe pulmonary artery, image 101/2 Mediastinum/Nodes: Normal appearance of the thyroid gland. The trachea appears patent and is midline. Normal appearance of the esophagus. No mediastinal or left hilar adenopathy. 1 cm right hilar lymph node is identified. Increased from 0.7 cm previously. Lungs/Pleura: Centrilobular and paraseptal emphysema. New moderate right pleural effusion and small left pleural effusion. Significant interval increase in tumor burden within the right lower lobe which measures 8.6 x 6.7 by 6.4 cm, image 112/2 and image 52/7. On the sagittal images this has a AP diameter 9.7 cm spanning the right lower lobe and right middle lobe. Adjacent ground-glass attenuation and interlobular septal thickening noted concerning for lymphangitic spread. On the previous exam this lesion was limited to the central right lower lobe measuring 1.9 x 2.5 by 1.5 cm. Tumor extension into the right infrahilar region with encasement of the right lower lobe and right middle lobe bronchi identified with postobstructive pneumonitis. New tumor within the central right middle lobe measures 4.4 x 2.5 by 5.4 cm, image 117/2 and image 25/6. Solid nodule within the right upper lobe measures 8 mm, image 31/5. Unchanged from previous exam. Thin walled cavitary nodule within the posterior left upper lobe measures 4 mm, image 43/5. Previously this measured 6 mm. Unchanged, ill-defined part solid  nodule in the lateral aspect of the superior segment of left lower lobe measuring 1 cm, image 78/5. Triangular-shaped, subpleural nodule in the posterior left lower lobe is new measuring 1.4 cm and may represent an area of pulmonary infarct is this is in the distribution of the left lower lobe pulmonary embolus. Upper Abdomen: No acute abnormality. Musculoskeletal: No acute or aggressive bone lesions identified. IMPRESSION: 1. Considerable increase in tumor burden involving the left lower lobe and now the right middle lobe. There is overlying ground-glass attenuation and interlobular septal thickening concerning for lymphangitic spread of disease. 2. New bilateral pleural effusions right greater than left. 3. Additional small solid and sub solid nodules involving the right upper lobe and left lung are not significantly changed in the interval. 4. Left upper lobe and left lower lobe segmental branch acute pulmonary artery emboli. Electronically Signed: By: Kerby Moors M.D. On: 04/06/2019 15:47   Dg Chest Port 1 View  Result Date: 04/07/2019 CLINICAL DATA:  Status post right thoracentesis. EXAM: PORTABLE CHEST 1 VIEW COMPARISON:  Film earlier today at 0800 hours FINDINGS: In the frontal projection there appears to be complete evacuation of right pleural fluid. Underlying atelectasis/consolidation at the right lung base remains. No pneumothorax or pulmonary edema. Stable cardiac enlargement.  IMPRESSION: No pneumothorax after right thoracentesis with complete evacuation of right pleural fluid. Underlying atelectasis and consolidation remains at the right lung base. Electronically Signed   By: Aletta Edouard M.D.   On: 04/07/2019 13:59   Vas [place] Lower Extremity Venous (dvt)  Result Date: 04/08/2019  Lower Venous Study Indications: Pulmonary embolism.  Risk Factors: Confirmed PE Cancer [place] - Lung. Comparison Study: No previous study available Performing Technologist: Toma Copier RVS  Examination  Guidelines: A complete evaluation includes B-mode imaging, spectral Doppler, color Doppler, and power Doppler as needed of all accessible portions of each vessel. Bilateral testing is considered an integral part of a complete examination. Limited examinations for reoccurring indications may be performed as noted.  +---------+---------------+---------+-----------+----------+-------------------+ RIGHT    CompressibilityPhasicitySpontaneityPropertiesThrombus Aging      +---------+---------------+---------+-----------+----------+-------------------+ CFV      Full           Yes      Yes                                      +---------+---------------+---------+-----------+----------+-------------------+ SFJ      Full                                                             +---------+---------------+---------+-----------+----------+-------------------+ FV Prox  Full           Yes      Yes                                      +---------+---------------+---------+-----------+----------+-------------------+ FV Mid   Full                                                             +---------+---------------+---------+-----------+----------+-------------------+ FV DistalFull           Yes      Yes                                      +---------+---------------+---------+-----------+----------+-------------------+ PFV      Full           Yes      Yes                                      +---------+---------------+---------+-----------+----------+-------------------+ POP      Partial        Yes      Yes                  Acute -Phasic in  the proximal                                                              region.             +---------+---------------+---------+-----------+----------+-------------------+ PTV      Partial                                      Acute mid to                                                               proximal            +---------+---------------+---------+-----------+----------+-------------------+ PERO                                                  Not visualized      +---------+---------------+---------+-----------+----------+-------------------+   Right Technical Findings: Not visualized segments include Peroneal. Unable to visualize the peroneal vein. Mild technical difficulty throughout due to acoustic shadowing from calcification of the arteries  +---------+---------------+---------+-----------+----------+------------------+ LEFT     CompressibilityPhasicitySpontaneityPropertiesThrombus Aging     +---------+---------------+---------+-----------+----------+------------------+ CFV      Full           Yes      Yes                                     +---------+---------------+---------+-----------+----------+------------------+ SFJ      Full                                                            +---------+---------------+---------+-----------+----------+------------------+ FV Prox  Full           Yes      Yes                                     +---------+---------------+---------+-----------+----------+------------------+ FV Mid   Full           Yes      Yes                                     +---------+---------------+---------+-----------+----------+------------------+ FV DistalPartial                                      Acute diatal to  mid                +---------+---------------+---------+-----------+----------+------------------+ PFV      Full           Yes      Yes                                     +---------+---------------+---------+-----------+----------+------------------+ POP      Partial                                      Acute               +---------+---------------+---------+-----------+----------+------------------+ PTV      Partial                                      Acute - Mid to                                                           proximal           +---------+---------------+---------+-----------+----------+------------------+ PERO     Full                                                            +---------+---------------+---------+-----------+----------+------------------+   Left Technical Findings: Difficult to visualize the peroneal veins. Mld technical difficulty due to acoustic shadowing calification of the arteries.   Summary: Right: Findings consistent with acute deep vein thrombosis involving the right popliteal vein, and right posterior tibial veins. See technical findings listed above Left: Findings consistent with acute deep vein thrombosis involving the left posterior tibial veins, left popliteal vein, and left femoral vein. See technical findings listed above.  *See table(s) above for measurements and observations. Electronically signed by Curt Jews MD on 04/08/2019 at 9:47:50 AM.    Final    US Thoracentesis Asp Pleural Space W/img Guide  Result Date: 04/07/2019 INDICATION: Patient with history of adenocarcinoma of right lung, pulmonary embolism of left lung, dyspnea, and right pleural effusion. Request is made for diagnostic and therapeutic right thoracentesis. EXAM: ULTRASOUND GUIDED DIAGNOSTIC AND THERAPEUTIC RIGHT THORACENTESIS MEDICATIONS: 10 mL of 1% lidocaine COMPLICATIONS: None immediate. PROCEDURE: An ultrasound guided thoracentesis was thoroughly discussed with the patient and questions answered. The benefits, risks, alternatives and complications were also discussed. The patient understands and wishes to proceed with the procedure. Written consent was obtained. Ultrasound was performed to localize and mark an adequate pocket of fluid in the right chest. The area was then prepped and  draped in the normal sterile fashion. 1% Lidocaine was used for local anesthesia. Under ultrasound guidance a 6 Fr Safe-T-Centesis catheter was introduced. Thoracentesis was performed. The catheter was removed and a dressing applied. FINDINGS: A total of approximately 450 mL of clear yellow fluid was removed. Samples were sent to the laboratory as requested by the clinical team. IMPRESSION: Successful ultrasound  guided right thoracentesis yielding 450 mL of pleural fluid. Read by: Earley Abide, PA-C Electronically Signed   By: Aletta Edouard M.D.   On: 04/07/2019 14:04    ASSESSMENT AND PLAN: This is a very pleasant 58 years old African-American male recently diagnosed with a stage IIIA non-small cell lung cancer, adenocarcinoma.  He completed a course of concurrent chemoradiation with weekly carboplatin and paclitaxel status post 7 cycles.   The patient tolerated this treatment well except for fatigue and odynophagia. The patient had repeat CT scan of the chest performed recently. I personally and independently reviewed the scan images and discussed the results with the patient today. Unfortunately his a scan showed significant increase in the tumor burden involving the left lower lobe and also now right lower lobe. There was overlying groundglass attenuation and interlobular septal thickening concerning for lymphangitic spread of tumor.  The patient also has new bilateral pleural effusions right greater than left with additional small solid and sub-solid nodules involving the right upper lobe and left lung that were not significantly changed in the interval.  The scan also showed the left upper lobe and left lower lobe segmental branch acute pulmonary emboli. I had a lengthy discussion with the patient today about his condition and treatment options.  Unfortunately with the evidence of disease progression after the course of concurrent chemoradiation, the patient has a metastatic disease at this point.  I discussed with the patient the option of palliative care versus palliative systemic therapy. I will order molecular studies by guardant 360 for evaluation of his molecular profile and also to check for the presence or absence of actionable mutation before starting treatment. I will see the patient back for follow-up visit in less than 2 weeks for evaluation and more detailed discussion of his treatment options based on the molecular studies. For the recent pulmonary embolism, the patient will continue his current treatment with Lovenox. The patient was advised to call immediately if he has any concerning symptoms in the interval. The patient voices understanding of current disease status and treatment options and is in agreement with the current care plan.  All questions were answered. The patient knows to call the clinic with any problems, questions or concerns. We can certainly see the patient much sooner if necessary.  Disclaimer: This note was dictated with voice recognition software. Similar sounding words can inadvertently be transcribed and may not be corrected upon review.

## 2019-04-13 ENCOUNTER — Telehealth: Payer: Self-pay | Admitting: Internal Medicine

## 2019-04-13 NOTE — Telephone Encounter (Signed)
Scheduled appt per 10/1 los - unable to reach pt . Left message with appt date and time

## 2019-04-17 ENCOUNTER — Telehealth: Payer: Self-pay | Admitting: Radiation Oncology

## 2019-04-17 NOTE — Telephone Encounter (Signed)
  Radiation Oncology         [phone] ________________________________  Name: Tyler [name] MRN: [id number]  Date of Service: 04/17/2019  DOB: 01-Jul-1961  Post Treatment Telephone Note  Diagnosis:    Stage IIIA, [id number] NSCLC, adenocarcinoma of the RLL  Interval Since Last Radiation:  5 weeks   01/29/19-03/14/2019:  The right lower lobe lung and regional nodes were treated to 60 Gy in 30 fractions with a 6 Gy boost in 3 fractions  Narrative:  The patient was contacted today for routine follow-up. He was recently diagnosed with stage III lung cancer and received definitive chemoRT to the RLL and regional nodes. During treatment he did very well with radiotherapy and did not have significant desquamation. He did have some esophagitis and recently had a new scan on 04/06/2019 that showed concerns for some progressive changes in the lungs as well as a PE and pleural effusion and has undergone thoracentesis.   Impression/Plan: 1.  Stage IIIA, [id number] NSCLC, adenocarcinoma of the RLL. I had to call and leave a message asking the patient to call me back to discuss how he's doing since completing treatment. He will otherwise continue to follow up with Dr. Julien Nordmann as well.    Carola Rhine, PAC

## 2019-04-19 ENCOUNTER — Other Ambulatory Visit: Payer: Self-pay

## 2019-04-19 ENCOUNTER — Emergency Department (HOSPITAL_COMMUNITY): Payer: Medicaid Other

## 2019-04-19 ENCOUNTER — Telehealth: Payer: Self-pay | Admitting: *Deleted

## 2019-04-19 ENCOUNTER — Inpatient Hospital Stay (HOSPITAL_COMMUNITY)
Admission: EM | Admit: 2019-04-19 | Discharge: 2019-05-02 | DRG: 871 | Disposition: A | Discharge status: Hospice | Payer: Medicaid Other | Attending: Internal Medicine | Admitting: Internal Medicine

## 2019-04-19 ENCOUNTER — Encounter (HOSPITAL_COMMUNITY): Payer: Self-pay

## 2019-04-19 ENCOUNTER — Emergency Department (HOSPITAL_COMMUNITY)
Admission: EM | Admit: 2019-04-19 | Discharge: 2019-04-19 | Disposition: A | Discharge status: Home / Self Care | Payer: Medicaid Other | Source: Home / Self Care | Attending: Emergency Medicine | Admitting: Emergency Medicine

## 2019-04-19 DIAGNOSIS — R52 Pain, unspecified: Secondary | ICD-10-CM | POA: Diagnosis not present

## 2019-04-19 DIAGNOSIS — I4891 Unspecified atrial fibrillation: Secondary | ICD-10-CM | POA: Diagnosis not present

## 2019-04-19 DIAGNOSIS — Z66 Do not resuscitate: Secondary | ICD-10-CM | POA: Diagnosis not present

## 2019-04-19 DIAGNOSIS — J690 Pneumonitis due to inhalation of food and vomit: Secondary | ICD-10-CM | POA: Diagnosis not present

## 2019-04-19 DIAGNOSIS — C3491 Malignant neoplasm of unspecified part of right bronchus or lung: Secondary | ICD-10-CM

## 2019-04-19 DIAGNOSIS — R109 Unspecified abdominal pain: Secondary | ICD-10-CM | POA: Diagnosis not present

## 2019-04-19 DIAGNOSIS — J90 Pleural effusion, not elsewhere classified: Secondary | ICD-10-CM | POA: Diagnosis not present

## 2019-04-19 DIAGNOSIS — J189 Pneumonia, unspecified organism: Secondary | ICD-10-CM | POA: Diagnosis present

## 2019-04-19 DIAGNOSIS — Z9889 Other specified postprocedural states: Secondary | ICD-10-CM

## 2019-04-19 DIAGNOSIS — I2699 Other pulmonary embolism without acute cor pulmonale: Secondary | ICD-10-CM | POA: Diagnosis present

## 2019-04-19 DIAGNOSIS — F1010 Alcohol abuse, uncomplicated: Secondary | ICD-10-CM | POA: Diagnosis present

## 2019-04-19 DIAGNOSIS — E440 Moderate protein-calorie malnutrition: Secondary | ICD-10-CM | POA: Diagnosis not present

## 2019-04-19 DIAGNOSIS — J449 Chronic obstructive pulmonary disease, unspecified: Secondary | ICD-10-CM | POA: Diagnosis present

## 2019-04-19 DIAGNOSIS — J9601 Acute respiratory failure with hypoxia: Secondary | ICD-10-CM | POA: Diagnosis present

## 2019-04-19 DIAGNOSIS — F17210 Nicotine dependence, cigarettes, uncomplicated: Secondary | ICD-10-CM | POA: Diagnosis present

## 2019-04-19 DIAGNOSIS — C799 Secondary malignant neoplasm of unspecified site: Secondary | ICD-10-CM | POA: Diagnosis not present

## 2019-04-19 DIAGNOSIS — R64 Cachexia: Secondary | ICD-10-CM | POA: Diagnosis present

## 2019-04-19 DIAGNOSIS — J918 Pleural effusion in other conditions classified elsewhere: Secondary | ICD-10-CM | POA: Diagnosis not present

## 2019-04-19 DIAGNOSIS — Z743 Need for continuous supervision: Secondary | ICD-10-CM | POA: Diagnosis not present

## 2019-04-19 DIAGNOSIS — I491 Atrial premature depolarization: Secondary | ICD-10-CM | POA: Diagnosis not present

## 2019-04-19 DIAGNOSIS — J9621 Acute and chronic respiratory failure with hypoxia: Secondary | ICD-10-CM | POA: Diagnosis not present

## 2019-04-19 DIAGNOSIS — Z515 Encounter for palliative care: Secondary | ICD-10-CM | POA: Diagnosis not present

## 2019-04-19 DIAGNOSIS — R079 Chest pain, unspecified: Secondary | ICD-10-CM | POA: Diagnosis not present

## 2019-04-19 DIAGNOSIS — G893 Neoplasm related pain (acute) (chronic): Secondary | ICD-10-CM | POA: Insufficient documentation

## 2019-04-19 DIAGNOSIS — Z20828 Contact with and (suspected) exposure to other viral communicable diseases: Secondary | ICD-10-CM | POA: Diagnosis present

## 2019-04-19 DIAGNOSIS — R197 Diarrhea, unspecified: Secondary | ICD-10-CM | POA: Diagnosis not present

## 2019-04-19 DIAGNOSIS — I10 Essential (primary) hypertension: Secondary | ICD-10-CM | POA: Diagnosis not present

## 2019-04-19 DIAGNOSIS — R0789 Other chest pain: Secondary | ICD-10-CM

## 2019-04-19 DIAGNOSIS — D630 Anemia in neoplastic disease: Secondary | ICD-10-CM | POA: Diagnosis present

## 2019-04-19 DIAGNOSIS — E876 Hypokalemia: Secondary | ICD-10-CM | POA: Diagnosis not present

## 2019-04-19 DIAGNOSIS — I480 Paroxysmal atrial fibrillation: Secondary | ICD-10-CM | POA: Clinically undetermined

## 2019-04-19 DIAGNOSIS — R54 Age-related physical debility: Secondary | ICD-10-CM | POA: Diagnosis present

## 2019-04-19 DIAGNOSIS — K219 Gastro-esophageal reflux disease without esophagitis: Secondary | ICD-10-CM | POA: Diagnosis present

## 2019-04-19 DIAGNOSIS — C3431 Malignant neoplasm of lower lobe, right bronchus or lung: Secondary | ICD-10-CM | POA: Insufficient documentation

## 2019-04-19 DIAGNOSIS — R0602 Shortness of breath: Secondary | ICD-10-CM | POA: Diagnosis not present

## 2019-04-19 DIAGNOSIS — R112 Nausea with vomiting, unspecified: Secondary | ICD-10-CM | POA: Diagnosis not present

## 2019-04-19 DIAGNOSIS — R000 Tachycardia, unspecified: Secondary | ICD-10-CM | POA: Diagnosis not present

## 2019-04-19 DIAGNOSIS — E43 Unspecified severe protein-calorie malnutrition: Secondary | ICD-10-CM | POA: Diagnosis not present

## 2019-04-19 DIAGNOSIS — R0689 Other abnormalities of breathing: Secondary | ICD-10-CM | POA: Diagnosis not present

## 2019-04-19 DIAGNOSIS — Z681 Body mass index (BMI) 19 or less, adult: Secondary | ICD-10-CM | POA: Diagnosis not present

## 2019-04-19 DIAGNOSIS — J9600 Acute respiratory failure, unspecified whether with hypoxia or hypercapnia: Secondary | ICD-10-CM | POA: Diagnosis not present

## 2019-04-19 DIAGNOSIS — Z923 Personal history of irradiation: Secondary | ICD-10-CM | POA: Diagnosis not present

## 2019-04-19 DIAGNOSIS — R5381 Other malaise: Secondary | ICD-10-CM | POA: Diagnosis not present

## 2019-04-19 DIAGNOSIS — Z4682 Encounter for fitting and adjustment of non-vascular catheter: Secondary | ICD-10-CM | POA: Diagnosis not present

## 2019-04-19 DIAGNOSIS — R0600 Dyspnea, unspecified: Secondary | ICD-10-CM | POA: Diagnosis not present

## 2019-04-19 DIAGNOSIS — A419 Sepsis, unspecified organism: Principal | ICD-10-CM | POA: Diagnosis present

## 2019-04-19 DIAGNOSIS — R0902 Hypoxemia: Secondary | ICD-10-CM | POA: Diagnosis not present

## 2019-04-19 DIAGNOSIS — I82431 Acute embolism and thrombosis of right popliteal vein: Secondary | ICD-10-CM | POA: Diagnosis present

## 2019-04-19 DIAGNOSIS — Z8249 Family history of ischemic heart disease and other diseases of the circulatory system: Secondary | ICD-10-CM

## 2019-04-19 DIAGNOSIS — Z79899 Other long term (current) drug therapy: Secondary | ICD-10-CM

## 2019-04-19 DIAGNOSIS — R410 Disorientation, unspecified: Secondary | ICD-10-CM | POA: Diagnosis not present

## 2019-04-19 DIAGNOSIS — R4182 Altered mental status, unspecified: Secondary | ICD-10-CM | POA: Diagnosis not present

## 2019-04-19 DIAGNOSIS — I82442 Acute embolism and thrombosis of left tibial vein: Secondary | ICD-10-CM | POA: Diagnosis present

## 2019-04-19 DIAGNOSIS — Z9221 Personal history of antineoplastic chemotherapy: Secondary | ICD-10-CM | POA: Diagnosis not present

## 2019-04-19 DIAGNOSIS — J984 Other disorders of lung: Secondary | ICD-10-CM | POA: Diagnosis not present

## 2019-04-19 DIAGNOSIS — R279 Unspecified lack of coordination: Secondary | ICD-10-CM | POA: Diagnosis not present

## 2019-04-19 DIAGNOSIS — Z7189 Other specified counseling: Secondary | ICD-10-CM | POA: Diagnosis not present

## 2019-04-19 LAB — CBC WITH DIFFERENTIAL/PLATELET
Abs Immature Granulocytes: 0.07 10*3/uL (ref 0.00–0.07)
Basophils Absolute: 0 10*3/uL (ref 0.0–0.1)
Basophils Relative: 0 %
Eosinophils Absolute: 0 10*3/uL (ref 0.0–0.5)
Eosinophils Relative: 0 %
HCT: 22.5 % — ABNORMAL LOW (ref 39.0–52.0)
Hemoglobin: 7.2 g/dL — ABNORMAL LOW (ref 13.0–17.0)
Immature Granulocytes: 1 %
Lymphocytes Relative: 11 %
Lymphs Abs: 1.2 10*3/uL (ref 0.7–4.0)
MCH: 27.6 pg (ref 26.0–34.0)
MCHC: 32 g/dL (ref 30.0–36.0)
MCV: 86.2 fL (ref 80.0–100.0)
Monocytes Absolute: 1.4 10*3/uL — ABNORMAL HIGH (ref 0.1–1.0)
Monocytes Relative: 13 %
Neutro Abs: 7.9 10*3/uL — ABNORMAL HIGH (ref 1.7–7.7)
Neutrophils Relative %: 75 %
Platelets: 215 10*3/uL (ref 150–400)
RBC: 2.61 MIL/uL — ABNORMAL LOW (ref 4.22–5.81)
RDW: 21.2 % — ABNORMAL HIGH (ref 11.5–15.5)
WBC: 10.6 10*3/uL — ABNORMAL HIGH (ref 4.0–10.5)
nRBC: 0 % (ref 0.0–0.2)

## 2019-04-19 LAB — BASIC METABOLIC PANEL
Anion gap: 10 (ref 5–15)
BUN: 13 mg/dL (ref 6–20)
CO2: 26 mmol/L (ref 22–32)
Calcium: 8.2 mg/dL — ABNORMAL LOW (ref 8.9–10.3)
Chloride: 96 mmol/L — ABNORMAL LOW (ref 98–111)
Creatinine, Ser: 0.4 mg/dL — ABNORMAL LOW (ref 0.61–1.24)
GFR calc Af Amer: >60 mL/min (ref >60)
GFR calc non Af Amer: >60 mL/min (ref >60)
Glucose, Bld: 108 mg/dL — ABNORMAL HIGH (ref 70–99)
Potassium: 4 mmol/L (ref 3.5–5.1)
Sodium: 132 mmol/L — ABNORMAL LOW (ref 135–145)

## 2019-04-19 LAB — COMPREHENSIVE METABOLIC PANEL
ALT: 16 U/L (ref 0–44)
AST: 17 U/L (ref 15–41)
Albumin: 2.3 g/dL — ABNORMAL LOW (ref 3.5–5.0)
Alkaline Phosphatase: 82 U/L (ref 38–126)
Anion gap: 11 (ref 5–15)
BUN: 14 mg/dL (ref 6–20)
CO2: 25 mmol/L (ref 22–32)
Calcium: 8.2 mg/dL — ABNORMAL LOW (ref 8.9–10.3)
Chloride: 96 mmol/L — ABNORMAL LOW (ref 98–111)
Creatinine, Ser: 0.47 mg/dL — ABNORMAL LOW (ref 0.61–1.24)
GFR calc Af Amer: >60 mL/min (ref >60)
GFR calc non Af Amer: >60 mL/min (ref >60)
Glucose, Bld: 156 mg/dL — ABNORMAL HIGH (ref 70–99)
Potassium: 4 mmol/L (ref 3.5–5.1)
Sodium: 132 mmol/L — ABNORMAL LOW (ref 135–145)
Total Bilirubin: 0.8 mg/dL (ref 0.3–1.2)
Total Protein: 6.9 g/dL (ref 6.5–8.1)

## 2019-04-19 LAB — LACTIC ACID, PLASMA
Lactic Acid, Venous: 2 mmol/L (ref 0.5–1.9)
Lactic Acid, Venous: 1.6 mmol/L (ref 0.5–1.9)

## 2019-04-19 LAB — CBC
HCT: 24.6 % — ABNORMAL LOW (ref 39.0–52.0)
Hemoglobin: 7.8 g/dL — ABNORMAL LOW (ref 13.0–17.0)
MCH: 27.6 pg (ref 26.0–34.0)
MCHC: 31.7 g/dL (ref 30.0–36.0)
MCV: 86.9 fL (ref 80.0–100.0)
Platelets: 225 10*3/uL (ref 150–400)
RBC: 2.83 MIL/uL — ABNORMAL LOW (ref 4.22–5.81)
RDW: 20.7 % — ABNORMAL HIGH (ref 11.5–15.5)
WBC: 10.4 10*3/uL (ref 4.0–10.5)
nRBC: 0 % (ref 0.0–0.2)

## 2019-04-19 LAB — URINALYSIS, ROUTINE W REFLEX MICROSCOPIC
Bilirubin Urine: NEGATIVE
Glucose, UA: NEGATIVE mg/dL
Ketones, ur: NEGATIVE mg/dL
Nitrite: NEGATIVE
Protein, ur: 100 mg/dL — AB
Specific Gravity, Urine: 1.023 (ref 1.005–1.030)
WBC, UA: >50 WBC/hpf — ABNORMAL HIGH (ref 0–5)
pH: 5 (ref 5.0–8.0)

## 2019-04-19 LAB — APTT: aPTT: 26 seconds (ref 24–36)

## 2019-04-19 LAB — HIV ANTIBODY (ROUTINE TESTING W REFLEX): HIV Screen 4th Generation wRfx: NONREACTIVE

## 2019-04-19 LAB — PROCALCITONIN: Procalcitonin: 12.09 ng/mL

## 2019-04-19 LAB — BRAIN NATRIURETIC PEPTIDE: B Natriuretic Peptide: 136.6 pg/mL — ABNORMAL HIGH (ref 0.0–100.0)

## 2019-04-19 LAB — PROTIME-INR
INR: 1.2 (ref 0.8–1.2)
Prothrombin Time: 14.6 seconds (ref 11.4–15.2)

## 2019-04-19 LAB — SARS CORONAVIRUS 2 BY RT PCR (HOSPITAL ORDER, PERFORMED IN ~~LOC~~ HOSPITAL LAB): SARS Coronavirus 2: NEGATIVE

## 2019-04-19 LAB — TROPONIN I (HIGH SENSITIVITY)
Troponin I (High Sensitivity): 9 ng/L (ref <18)
Troponin I (High Sensitivity): 19 ng/L — ABNORMAL HIGH (ref <18)

## 2019-04-19 MED ORDER — HYDROMORPHONE HCL 1 MG/ML IJ SOLN
0.5000 mg | Freq: Once | INTRAMUSCULAR | Status: AC
  Administered 2019-04-19: 13:00:00 0.5 mg via INTRAVENOUS
  Filled 2019-04-19: qty 1

## 2019-04-19 MED ORDER — UMECLIDINIUM BROMIDE 62.5 MCG/INH IN AEPB
1.0000 | INHALATION_SPRAY | Freq: Every day | RESPIRATORY_TRACT | Status: DC
  Filled 2019-04-19: qty 7

## 2019-04-19 MED ORDER — SODIUM CHLORIDE 0.9 % IV BOLUS
1000.0000 mL | Freq: Once | INTRAVENOUS | Status: AC
  Administered 2019-04-19: 1000 mL via INTRAVENOUS

## 2019-04-19 MED ORDER — SODIUM CHLORIDE 0.9 % IV SOLN
INTRAVENOUS | Status: DC
  Administered 2019-04-19 – 2019-04-20 (×2): via INTRAVENOUS

## 2019-04-19 MED ORDER — SODIUM CHLORIDE 0.9 % IV BOLUS
500.0000 mL | Freq: Once | INTRAVENOUS | Status: AC
  Administered 2019-04-19: 500 mL via INTRAVENOUS

## 2019-04-19 MED ORDER — HYDROMORPHONE HCL 1 MG/ML IJ SOLN
0.5000 mg | Freq: Once | INTRAMUSCULAR | Status: AC
  Administered 2019-04-19: 17:00:00 0.5 mg via INTRAVENOUS
  Filled 2019-04-19: qty 1

## 2019-04-19 MED ORDER — SUCRALFATE 1 G PO TABS
1.0000 g | ORAL_TABLET | Freq: Three times a day (TID) | ORAL | Status: DC
  Administered 2019-04-19 – 2019-05-02 (×36): 1 g via ORAL
  Filled 2019-04-19 (×43): qty 1

## 2019-04-19 MED ORDER — OXYCODONE-ACETAMINOPHEN 5-325 MG PO TABS
1.0000 | ORAL_TABLET | ORAL | Status: DC | PRN
  Administered 2019-04-19 – 2019-04-25 (×24): 2 via ORAL
  Filled 2019-04-19 (×26): qty 2

## 2019-04-19 MED ORDER — ENOXAPARIN SODIUM 100 MG/ML ~~LOC~~ SOLN
90.0000 mg | SUBCUTANEOUS | Status: DC
  Administered 2019-04-20 – 2019-04-23 (×3): 90 mg via SUBCUTANEOUS
  Filled 2019-04-19 (×3): qty 1

## 2019-04-19 MED ORDER — OXYCODONE-ACETAMINOPHEN 5-325 MG PO TABS: 1.0000 | ORAL_TABLET | ORAL | 0 refills | Status: DC | PRN

## 2019-04-19 MED ORDER — ONDANSETRON HCL 4 MG/2ML IJ SOLN
4.0000 mg | Freq: Once | INTRAMUSCULAR | Status: AC
  Administered 2019-04-19: 4 mg via INTRAVENOUS
  Filled 2019-04-19: qty 2

## 2019-04-19 MED ORDER — HYDROMORPHONE HCL 1 MG/ML IJ SOLN
1.0000 mg | Freq: Once | INTRAMUSCULAR | Status: AC
  Administered 2019-04-19: 04:00:00 1 mg via INTRAVENOUS
  Filled 2019-04-19: qty 1

## 2019-04-19 MED ORDER — SODIUM CHLORIDE 0.9 % IV SOLN
2.0000 g | Freq: Once | INTRAVENOUS | Status: AC
  Administered 2019-04-19: 2 g via INTRAVENOUS
  Filled 2019-04-19: qty 2

## 2019-04-19 MED ORDER — OXYCODONE-ACETAMINOPHEN 5-325 MG PO TABS
1.0000 | ORAL_TABLET | Freq: Once | ORAL | Status: AC
  Administered 2019-04-19: 1 via ORAL
  Filled 2019-04-19: qty 1

## 2019-04-19 MED ORDER — ENSURE ENLIVE PO LIQD: 237.0000 mL | Freq: Every day | ORAL | Status: DC

## 2019-04-19 MED ORDER — MAGNESIUM OXIDE 400 (241.3 MG) MG PO TABS
400.0000 mg | ORAL_TABLET | Freq: Two times a day (BID) | ORAL | Status: DC
  Administered 2019-04-20 – 2019-05-02 (×22): 400 mg via ORAL
  Filled 2019-04-19 (×24): qty 1

## 2019-04-19 MED ORDER — HYDROMORPHONE HCL 1 MG/ML IJ SOLN
0.5000 mg | Freq: Once | INTRAMUSCULAR | Status: AC
  Administered 2019-04-19: 0.5 mg via INTRAVENOUS
  Filled 2019-04-19: qty 1

## 2019-04-19 MED ORDER — POTASSIUM CHLORIDE CRYS ER 20 MEQ PO TBCR
20.0000 meq | EXTENDED_RELEASE_TABLET | Freq: Every day | ORAL | Status: DC
  Administered 2019-04-20 – 2019-05-02 (×13): 20 meq via ORAL
  Filled 2019-04-19 (×13): qty 1

## 2019-04-19 MED ORDER — SODIUM CHLORIDE 0.9 % IV BOLUS
500.0000 mL | Freq: Once | INTRAVENOUS | Status: AC | PRN
  Administered 2019-04-19: 999 mL via INTRAVENOUS

## 2019-04-19 MED ORDER — SODIUM CHLORIDE 0.9 % IV SOLN
3.0000 g | Freq: Four times a day (QID) | INTRAVENOUS | Status: DC
  Administered 2019-04-20 – 2019-04-30 (×42): 3 g via INTRAVENOUS
  Filled 2019-04-19 (×7): qty 3
  Filled 2019-04-19: qty 8
  Filled 2019-04-19 (×2): qty 3
  Filled 2019-04-19: qty 8
  Filled 2019-04-19 (×6): qty 3
  Filled 2019-04-19 (×2): qty 8
  Filled 2019-04-19 (×3): qty 3
  Filled 2019-04-19 (×2): qty 8
  Filled 2019-04-19 (×3): qty 3
  Filled 2019-04-19: qty 8
  Filled 2019-04-19: qty 3
  Filled 2019-04-19: qty 8
  Filled 2019-04-19 (×5): qty 3
  Filled 2019-04-19: qty 8
  Filled 2019-04-19 (×3): qty 3
  Filled 2019-04-19: qty 8
  Filled 2019-04-19 (×3): qty 3
  Filled 2019-04-19: qty 8

## 2019-04-19 MED ORDER — ALBUTEROL SULFATE (2.5 MG/3ML) 0.083% IN NEBU: 2.5000 mg | INHALATION_SOLUTION | Freq: Four times a day (QID) | RESPIRATORY_TRACT | Status: DC | PRN

## 2019-04-19 MED ORDER — IOHEXOL 350 MG/ML SOLN
100.0000 mL | Freq: Once | INTRAVENOUS | Status: AC | PRN
  Administered 2019-04-19: 100 mL via INTRAVENOUS

## 2019-04-19 MED ORDER — IBUPROFEN 200 MG PO TABS
400.0000 mg | ORAL_TABLET | Freq: Four times a day (QID) | ORAL | Status: DC | PRN
  Administered 2019-04-19: 400 mg via ORAL
  Filled 2019-04-19 (×3): qty 2

## 2019-04-19 MED ORDER — SODIUM CHLORIDE (PF) 0.9 % IJ SOLN
INTRAMUSCULAR | Status: AC
  Filled 2019-04-19: qty 50

## 2019-04-19 MED ORDER — TIOTROPIUM BROMIDE MONOHYDRATE 18 MCG IN CAPS: 18.0000 ug | ORAL_CAPSULE | Freq: Every day | RESPIRATORY_TRACT | Status: DC

## 2019-04-19 NOTE — ED Triage Notes (Addendum)
Pt BIB EMS from home. Pt c/o pain (pt has lung cancer), chills. Pt discharged this morning from [facility], c/o worsening pain. Pt prescribed with Percocet last night and last took 1130 today.  Pt diagnosed with PE last week.   18G L AC  EMS gave 1000 mg tylenol @1145  Resp 40 HR 140's

## 2019-04-19 NOTE — Progress Notes (Signed)
Pharmacy Antibiotic Note  Tyler [name] is a 59 y.o. male admitted on 04/19/2019 with pneumonia and bacteremia.  Pharmacy has been consulted for Unasyn dosing for aspiration PNA.   Plan: Unasyn 3 gm IV q6h Pharmacy to sign off    Temp (24hrs), Avg:100.2 F (37.9 C), Min:98.2 F (36.8 C), Max:103.5 F (39.7 C)  Recent Labs  Lab 04/19/19 0424 04/19/19 1235 04/19/19 1242 04/19/19 1457  WBC 10.6*  --  10.4  --   CREATININE 0.40*  --  0.47*  --   LATICACIDVEN  --  2.0*  --  1.6    Estimated Creatinine Clearance: 81.9 mL/min (A) (by C-G formula based on SCr of 0.47 mg/dL (L)).    No Known Allergies  Antimicrobials this admission: 10/8 cefepime x 1 dose 10/8 Unasyn>.  Dose adjustments this admission:  Microbiology results:   Thank you for allowing pharmacy to be a part of this patient's care.  Eudelia Bunch, Pharm.D [phone] 04/19/2019 5:36 PM

## 2019-04-19 NOTE — H&P (Signed)
History and Physical  Tyler [name] EHU:[id number] DOB: 04-21-1961 DOA: 04/19/2019  PCP: Brand Males, MD   Chief Complaint: Fever chills  HPI:  69 black male stage III lung cancer diagnosed 01/2019 on carboplatin p aclitaxel 5 cycles given last office visit 03/05/2019 Dr. Pamala Hurry cGy in 30 fractions XRT, recent admission 9/25 acute PE + bilateral DVT/Lovenox prior EtOH, tobacco quit smoking several months ago again Came to ED early a.m. 10/8 2020 with some chest pain malaise and right chest since chest x-ray did not show pneumothorax or effusion he was in mild distress he was discharged home with low-dose Percocet-return to ED later on T-max 103.5  History is difficult to obtain patient rather histrionic moaning groaning seems oriented to some degree but mainly complaining of pain-states no ill contacts lives with wife at home-no diarrhea-no sputum-no blurred vision-no double vision-no unilateral weakness   ED Course: Given 1.5 L NS, CT chest, cefepime 2 g kept on airborne n.p.o.  COVID-19 testing is negative   Review of Systems:  Limited given patient cooperation  Past Medical History:  Diagnosis Date  . Alcohol abuse   . Chronic back pain   . COPD (chronic obstructive pulmonary disease) ([place])   . Dyspnea   . Lung mass    mediastinal adenopathy  . Lung nodule   . Multiple pulmonary nodules 04/02/2018  . Pneumonia   . Right lower lobe pulmonary nodule 04/02/2018   12/01/2017: PET SUV 2.1  . Tobacco abuse     Past Surgical History:  Procedure Laterality Date  . APPENDECTOMY    . VIDEO BRONCHOSCOPY WITH ENDOBRONCHIAL NAVIGATION N/A 04/05/2018   Procedure: VIDEO BRONCHOSCOPY WITH ENDOBRONCHIAL NAVIGATION;  Surgeon: Garner Nash, DO;  Location: [place];  Service: Thoracic;  Laterality: N/A;  . VIDEO BRONCHOSCOPY WITH ENDOBRONCHIAL NAVIGATION N/A 01/11/2019   Procedure: VIDEO BRONCHOSCOPY WITH ENDOBRONCHIAL NAVIGATION;  Surgeon: Garner Nash, DO;  Location: [place];   Service: Thoracic;  Laterality: N/A;  . VIDEO BRONCHOSCOPY WITH ENDOBRONCHIAL ULTRASOUND N/A 01/11/2019   Procedure: VIDEO BRONCHOSCOPY WITH ENDOBRONCHIAL ULTRASOUND;  Surgeon: Garner Nash, DO;  Location: [place];  Service: Thoracic;  Laterality: N/A;  . VIDEO BRONCHOSCOPY WITH RADIAL ENDOBRONCHIAL ULTRASOUND N/A 01/11/2019   Procedure: VIDEO BRONCHOSCOPY WITH RADIAL ENDOBRONCHIAL ULTRASOUND;  Surgeon: Garner Nash, DO;  Location: [place];  Service: Thoracic;  Laterality: N/A;  . WISDOM TOOTH EXTRACTION       reports that he has been smoking cigarettes. He has a 10.00 pack-year smoking history. He has never used smokeless tobacco. He reports previous alcohol use. He reports previous drug use. Drug: Marijuana. Mobility: Independent  No Known Allergies  Family History  Problem Relation Age of Onset  . Heart attack Maternal Grandfather      Prior to Admission medications   Medication Sig Start Date End Date Taking? Authorizing Provider  albuterol (PROVENTIL HFA;VENTOLIN HFA) 108 (90 Base) MCG/ACT inhaler Inhale 2 puffs into the lungs every 6 (six) hours as needed for wheezing or shortness of breath. 06/27/18  Yes Icard, Bradley L, DO  enoxaparin (LOVENOX) 100 MG/ML injection Inject 0.9 mLs (90 mg total) into the skin daily. 04/11/19 05/11/19 Yes GhimireDante Gang, MD  feeding supplement, ENSURE ENLIVE, (ENSURE ENLIVE) LIQD Take 237 mLs by mouth 3 (three) times daily between meals. Patient taking differently: Take 237 mLs by mouth daily.  08/11/17  Yes Ghimire, Henreitta Leber, MD  ibuprofen (ADVIL) 200 MG tablet Take 400 mg by mouth every 6 (six) hours as needed for  fever, headache or moderate pain.   Yes [provider]  Magnesium Oxide 400 MG CAPS Take 1 capsule (400 mg total) by mouth 2 (two) times daily. 04/10/19 05/10/19 Yes Ghimire, Dante Gang, MD  potassium chloride SA (K-DUR) 20 MEQ tablet Take 1 tablet (20 mEq total) by mouth daily. 04/06/19  Yes Tish Men, MD  sucralfate (CARAFATE) 1 g  tablet Take 1 g by mouth 4 (four) times daily -  with meals and at bedtime.   Yes [provider]  tiotropium (SPIRIVA HANDIHALER) 18 MCG inhalation capsule Place 1 capsule (18 mcg total) into inhaler and inhale daily. 06/27/18 01/05/20 Yes Icard, Octavio Graves, DO  lidocaine (XYLOCAINE) 2 % solution 5 ml swallow before meals for radiation esophagitis Patient taking differently: 5 ml swallow before meals for radiation esophagitis as needed to numb 03/30/19   [name], MD  oxyCODONE-acetaminophen (PERCOCET) 5-325 MG tablet Take 1-2 tablets by mouth every 4 (four) hours as needed for moderate pain or severe pain. 04/19/19   Orpah Greek, MD    Physical Exam: Vitals:   04/19/19 1500 04/19/19 1530  BP: 116/79 104/77  Pulse: (!) 111 93  Resp: (!) 23 15  Temp:    SpO2: 96% 98%   Alert frail cachectic black male looking much older than stated age male pattern baldness with patchy hair Burn marks over front and back of chest consistent with radiation therapy no icterus no scleral pallor no JVD no thyromegaly no lymphadenopathy Abdomen soft no rebound no guarding Very frail very small build compared to height no lower extremity edema   I have personally reviewed following labs and imaging studies  Labs:  Flu: Not performed RVP: Not performed CBC: leukopenia, lymphopenia WBC 10.4 hemoglobin down from baseline of 8-7.8 platelet 224 BMP: increased BUN/Cr BUN/creatinine 14/0.4 usual baseline is 6/0.4 LFTs within normal limits CRP: increased not elevated however lactic acid cycle from 2.0-1.6 LDH: increased not elevated Procalcitonin: low not performed CXR: hazy bilateral peripheral opacities    CT chest: GGO, consolidation, crazy paving right lower lobe cancer difficult to define masslike consolidation medial aspect right upper lobes = metastatic disease worsening moderate right pleural effusion slight worsening consolidation right base new obstruction right middle lobe lower  lobe?  Aspiration?  Plugging-mild worsening patchy airspace process superior segment left lower lobe anterior right midlung?  Infectious previously seen small left-sided pulmonary emboli not visualized no other emboli visualized  Imaging studies:   As above  Medical tests:   EKG independently reviewed: WBC 10.3 PR interval 0.20 QRS axis 70, LVH by criteria in V2 V5 no ST-T wave changes compared to prior EKG 9/28 there does not appear to be a significant change  Principal Problem:   Aspiration pneumonia ([place]) Active Problems:   Acute respiratory failure with hypoxia (HCC)   Protein-calorie malnutrition, severe   Pulmonary embolism ([facility])   Assessment/Plan Sepsis 2/2 aspiration/postobstructive pneumonia-Rx essentially same-transition ceftriaxone given in the emergency room to Unasyn NS 100 cc/H cycle lactic acid follow blood sputum culture repeat CXR  Stage III lung adenocarcinoma versus stage IV status post XRT-informed Dr. [name] regarding patient admission has been on carboplatin paclitaxel which may need to be held until treatment finished-first choice ibuprofen 400, second choice Percocet 5/325 1-2  Recent acute pulmonary embolism with DVTs CT scan chest not showing any further embolism continue Lovenox likely lifelong further outpatient discussion oncologist  Sinus tachycardia LVH per criterion but very thin chest wall with cachexia-monitor on telemetry after admission  Reflux continue  Carafate 1 g 3 times daily  COPD continue albuterol 2 puffs every 6 as needed tiotropium in addition   Severity of Illness: The appropriate patient status for this patient is INPATIENT. Inpatient status is judged to be reasonable and necessary in order to provide the required intensity of service to ensure the patient's safety. The patient's presenting symptoms, physical exam findings, and initial radiographic and laboratory data in the context of their chronic comorbidities is felt to place them  at high risk for further clinical deterioration. Furthermore, it is not anticipated that the patient will be medically stable for discharge from the hospital within 2 midnights of admission. The following factors support the patient status of inpatient.   " The patient's presenting symptoms include high fever chest pain chills. " The worrisome physical exam findings include cachexia. " The initial radiographic and laboratory data are worrisome because of pneumonia. " The chronic co-morbidities include lung cancer.   * I certify that at the point of admission it is my clinical judgment that the patient will require inpatient hospital care spanning beyond 2 midnights from the point of admission due to high intensity of service, high risk for further deterioration and high frequency of surveillance required.*     DVT prophylaxis: On therapeutic Lovenox Code Status: Full confirmed at bedside Family Communication: None Consults called: No  Time spent: 50 minutes  Verneita Griffes, MD [facility] 4:27 PM  04/19/2019, 4:27 PM

## 2019-04-19 NOTE — ED Notes (Signed)
ED TO INPATIENT HANDOFF REPORT  ED Nurse Name and Phone #:   S Name/Age/Gender Tyler [name] 58 y.o. male Room/Bed: WA04/WA04  Code Status   Code Status: Prior  Home/SNF/Other Home Patient oriented to: self, place, time and situation Is this baseline? Yes   Triage Complete: Triage complete  Chief Complaint ca pt shob pain  Triage Note Pt BIB EMS from home. Pt c/o pain (pt has lung cancer), chills. Pt discharged this morning from [facility], c/o worsening pain. Pt prescribed with Percocet last night and last took 1130 today.  Pt diagnosed with PE last week.   18G L AC  EMS gave 1000 mg tylenol @1145  Resp 40 HR 140's   Allergies No Known Allergies  Level of Care/Admitting Diagnosis ED Disposition    ED Disposition Condition Comment   Admit  Hospital Area: [facility] [[id number]]  Level of Care: Telemetry [5]  Admit to tele based on following criteria: Monitor for Ischemic changes  Covid Evaluation: Confirmed COVID Negative  Diagnosis: PNA (pneumonia) [[id number]]  Admitting Physician: Nita Sells [phone]  Attending Physician: Nita Sells [phone]  Estimated length of stay: 3 - 4 days  Certification:: I certify this patient will need inpatient services for at least 2 midnights  PT Class (Do Not Modify): Inpatient [101]  PT Acc Code (Do Not Modify): Private [1]       B Medical/Surgery History Past Medical History:  Diagnosis Date  . Alcohol abuse   . Chronic back pain   . COPD (chronic obstructive pulmonary disease) ([place])   . Dyspnea   . Lung mass    mediastinal adenopathy  . Lung nodule   . Multiple pulmonary nodules 04/02/2018  . Pneumonia   . Right lower lobe pulmonary nodule 04/02/2018   12/01/2017: PET SUV 2.1  . Tobacco abuse    Past Surgical History:  Procedure Laterality Date  . APPENDECTOMY    . VIDEO BRONCHOSCOPY WITH ENDOBRONCHIAL NAVIGATION N/A 04/05/2018   Procedure: VIDEO BRONCHOSCOPY WITH ENDOBRONCHIAL  NAVIGATION;  Surgeon: Garner Nash, DO;  Location: [place];  Service: Thoracic;  Laterality: N/A;  . VIDEO BRONCHOSCOPY WITH ENDOBRONCHIAL NAVIGATION N/A 01/11/2019   Procedure: VIDEO BRONCHOSCOPY WITH ENDOBRONCHIAL NAVIGATION;  Surgeon: Garner Nash, DO;  Location: [place];  Service: Thoracic;  Laterality: N/A;  . VIDEO BRONCHOSCOPY WITH ENDOBRONCHIAL ULTRASOUND N/A 01/11/2019   Procedure: VIDEO BRONCHOSCOPY WITH ENDOBRONCHIAL ULTRASOUND;  Surgeon: Garner Nash, DO;  Location: [place];  Service: Thoracic;  Laterality: N/A;  . VIDEO BRONCHOSCOPY WITH RADIAL ENDOBRONCHIAL ULTRASOUND N/A 01/11/2019   Procedure: VIDEO BRONCHOSCOPY WITH RADIAL ENDOBRONCHIAL ULTRASOUND;  Surgeon: Garner Nash, DO;  Location: [place];  Service: Thoracic;  Laterality: N/A;  . WISDOM TOOTH EXTRACTION       A IV Location/Drains/Wounds Patient Lines/Drains/Airways Status   Active Line/Drains/Airways    Name:   Placement date:   Placement time:   Site:   Days:   Peripheral IV 04/19/19 Right Forearm   04/19/19    1235    Forearm   less than 1          Intake/Output Last 24 hours No intake or output data in the 24 hours ending 04/19/19 1751  Labs/Imaging Results for orders placed or performed during the hospital encounter of 04/19/19 (from the past 48 hour(s))  Lactic acid, plasma     Status: Abnormal   Collection Time: 04/19/19 12:35 PM  Result Value Ref Range   Lactic Acid, Venous 2.0 (HH) 0.5 -  1.9 mmol/L    Comment: CRITICAL RESULT CALLED TO, READ BACK BY AND VERIFIED WITH: Emeline Gins RN 1321 04/19/19 JM Performed at [facility], [place] [address], [place], [place] [address]   CBC     Status: Abnormal   Collection Time: 04/19/19 12:42 PM  Result Value Ref Range   WBC 10.4 4.0 - 10.5 K/uL   RBC 2.83 (L) 4.22 - 5.81 MIL/uL   Hemoglobin 7.8 (L) 13.0 - 17.0 g/dL   HCT 24.6 (L) 39.0 - 52.0 %   MCV 86.9 80.0 - 100.0 fL   MCH 27.6 26.0 - 34.0 pg   MCHC 31.7 30.0 - 36.0 g/dL   RDW 20.7 (H)  11.5 - 15.5 %   Platelets 225 150 - 400 K/uL   nRBC 0.0 0.0 - 0.2 %    Comment: Performed at [facility], [place] [address] [place], [place] [address]  Comprehensive metabolic panel     Status: Abnormal   Collection Time: 04/19/19 12:42 PM  Result Value Ref Range   Sodium 132 (L) 135 - 145 mmol/L   Potassium 4.0 3.5 - 5.1 mmol/L   Chloride 96 (L) 98 - 111 mmol/L   CO2 25 22 - 32 mmol/L   Glucose, Bld 156 (H) 70 - 99 mg/dL   BUN 14 6 - 20 mg/dL   Creatinine, Ser 0.47 (L) 0.61 - 1.24 mg/dL   Calcium 8.2 (L) 8.9 - 10.3 mg/dL   Total Protein 6.9 6.5 - 8.1 g/dL   Albumin 2.3 (L) 3.5 - 5.0 g/dL   AST 17 15 - 41 U/L   ALT 16 0 - 44 U/L   Alkaline Phosphatase 82 38 - 126 U/L   Total Bilirubin 0.8 0.3 - 1.2 mg/dL   GFR calc non Af Amer >60 >60 mL/min   GFR calc Af Amer >60 >60 mL/min   Anion gap 11 5 - 15    Comment: Performed at [facility], [place] [address], [place], [place] [address]  Troponin I (High Sensitivity)     Status: None   Collection Time: 04/19/19 12:42 PM  Result Value Ref Range   Troponin I (High Sensitivity) 9 <18 ng/L    Comment: (NOTE) Elevated high sensitivity troponin I (hsTnI) values and significant  changes across serial measurements may suggest ACS but many other  chronic and acute conditions are known to elevate hsTnI results.  Refer to the "Links" section for chest pain algorithms and additional  guidance. Performed at [facility], [place] [address] [place], [place] [address]   Brain natriuretic peptide     Status: Abnormal   Collection Time: 04/19/19 12:42 PM  Result Value Ref Range   B Natriuretic Peptide 136.6 (H) 0.0 - 100.0 pg/mL    Comment: Performed at [facility], [place] [address] [place], [place] [address]  APTT     Status: None   Collection Time: 04/19/19 12:42 PM  Result Value Ref Range   aPTT 26 24 - 36 seconds    Comment: Performed at [facility],  [place] [address], [place], [place] [address]  Protime-INR     Status: None   Collection Time: 04/19/19 12:42 PM  Result Value Ref Range   Prothrombin Time 14.6 11.4 - 15.2 seconds   INR 1.2 0.8 - 1.2    Comment: (NOTE) INR goal varies based on device and disease states. Performed at [facility], [place] [address] [place],  [place] [address]   SARS Coronavirus 2 by RT PCR (hospital order, performed in [facility] hospital lab) Nasopharyngeal Nasopharyngeal Swab     Status: None   Collection Time: 04/19/19  2:13 PM   Specimen: Nasopharyngeal Swab  Result Value Ref Range   SARS Coronavirus 2 NEGATIVE NEGATIVE    Comment: (NOTE) If result is NEGATIVE SARS-CoV-2 target nucleic acids are NOT DETECTED. The SARS-CoV-2 RNA is generally detectable in upper and lower  respiratory specimens during the acute phase of infection. The lowest  concentration of SARS-CoV-2 viral copies this assay can detect is 250  copies / mL. A negative result does not preclude SARS-CoV-2 infection  and should not be used as the sole basis for treatment or other  patient management decisions.  A negative result may occur with  improper specimen collection / handling, submission of specimen other  than nasopharyngeal swab, presence of viral mutation(s) within the  areas targeted by this assay, and inadequate number of viral copies  (<250 copies / mL). A negative result must be combined with clinical  observations, patient history, and epidemiological information. If result is POSITIVE SARS-CoV-2 target nucleic acids are DETECTED. The SARS-CoV-2 RNA is generally detectable in upper and lower  respiratory specimens dur ing the acute phase of infection.  Positive  results are indicative of active infection with SARS-CoV-2.  Clinical  correlation with patient history and other diagnostic information is  necessary to determine patient infection status.  Positive results do  not rule out bacterial  infection or co-infection with other viruses. If result is PRESUMPTIVE POSTIVE SARS-CoV-2 nucleic acids MAY BE PRESENT.   A presumptive positive result was obtained on the submitted specimen  and confirmed on repeat testing.  While 2019 novel coronavirus  (SARS-CoV-2) nucleic acids may be present in the submitted sample  additional confirmatory testing may be necessary for epidemiological  and / or clinical management purposes  to differentiate between  SARS-CoV-2 and other Sarbecovirus currently known to infect humans.  If clinically indicated additional testing with an alternate test  methodology [id number]) is advised. The SARS-CoV-2 RNA is generally  detectable in upper and lower respiratory sp ecimens during the acute  phase of infection. The expected result is Negative. Fact Sheet for Patients:  StrictlyIdeas.no Fact Sheet for Healthcare Providers: BankingDealers.co.za This test is not yet approved or cleared by the [place] FDA and has been authorized for detection and/or diagnosis of SARS-CoV-2 by FDA under an Emergency Use Authorization (EUA).  This EUA will remain in effect (meaning this test can be used) for the duration of the COVID-19 declaration under Section 564(b)(1) of the Act, 21 U.S.C. section 360bbb-3(b)(1), unless the authorization is terminated or revoked sooner. Performed at [facility], [place] [address], [place], [place] [address]   Lactic acid, plasma     Status: None   Collection Time: 04/19/19  2:57 PM  Result Value Ref Range   Lactic Acid, Venous 1.6 0.5 - 1.9 mmol/L    Comment: Performed at [facility], [place] [address], [place], [place] [address]  Troponin I (High Sensitivity)     Status: Abnormal   Collection Time: 04/19/19  2:57 PM  Result Value Ref Range   Troponin I (High Sensitivity) 19 (H) <18 ng/L    Comment: (NOTE) Elevated high sensitivity troponin I  (hsTnI) values and significant  changes across serial measurements may suggest ACS but many other  chronic and acute conditions are known to elevate hsTnI results.  Refer  to the "Links" section for chest pain algorithms and additional  guidance. Performed at [facility], [place] [address], [place], [place] [address]   Urinalysis, Routine w reflex microscopic     Status: Abnormal   Collection Time: 04/19/19  3:13 PM  Result Value Ref Range   Color, Urine AMBER (A) YELLOW    Comment: BIOCHEMICALS MAY BE AFFECTED BY COLOR   APPearance CLOUDY (A) CLEAR   Specific Gravity, Urine 1.023 1.005 - 1.030   pH 5.0 5.0 - 8.0   Glucose, UA NEGATIVE NEGATIVE mg/dL   Hgb urine dipstick SMALL (A) NEGATIVE   Bilirubin Urine NEGATIVE NEGATIVE   Ketones, ur NEGATIVE NEGATIVE mg/dL   Protein, ur 100 (A) NEGATIVE mg/dL   Nitrite NEGATIVE NEGATIVE   Leukocytes,Ua MODERATE (A) NEGATIVE   RBC / HPF 21-50 0 - 5 RBC/hpf   WBC, UA >50 (H) 0 - 5 WBC/hpf   Bacteria, UA RARE (A) NONE SEEN   Squamous Epithelial / LPF 6-10 0 - 5   WBC Clumps PRESENT    Mucus PRESENT     Comment: Performed at [facility], [place] [address] [place], [place] [address]   Ct Head Wo Contrast  Result Date: 04/19/2019 CLINICAL DATA:  Altered mental status EXAM: CT HEAD WITHOUT CONTRAST TECHNIQUE: Contiguous axial images were obtained from the base of the skull through the vertex without intravenous contrast. COMPARISON:  MR brain, 01/18/2019 FINDINGS: Brain: No evidence of acute infarction, hemorrhage, hydrocephalus, extra-axial collection or mass lesion/mass effect. The ventricular white matter hypodensity. Vascular: No hyperdense vessel or unexpected calcification. Skull: Normal. Negative for fracture or focal lesion. Sinuses/Orbits: No acute finding. Other: None. IMPRESSION: No acute intracranial pathology.  Small-vessel white matter disease. Electronically Signed   By: Eddie Candle M.D.   On:  04/19/2019 15:02   Ct Angio Chest Pe W Or Wo Contrast  Result Date: 04/19/2019 CLINICAL DATA:  Chest pain and respiratory distress. History of lung cancer. Discharge home was a long emergency room today. Diagnosed with pulmonary embolism last week. EXAM: CT ANGIOGRAPHY CHEST WITH CONTRAST TECHNIQUE: Multidetector CT imaging of the chest was performed using the standard protocol during bolus administration of intravenous contrast. Multiplanar CT image reconstructions and MIPs were obtained to evaluate the vascular anatomy. CONTRAST:  19mL OMNIPAQUE IOHEXOL 350 MG/ML SOLN COMPARISON:  04/06/2019 and 12/11/2018 FINDINGS: Cardiovascular: Mild stable cardiomegaly. Thoracic aorta is normal in caliber. Pulmonary arterial system is well opacified as the previously seen small proximal left-sided pulmonary emboli are no longer visualized. No current pulmonary emboli are visualized bilaterally. Remaining vascular structures are unremarkable. Mediastinum/Nodes: No definite adenopathy visualized. Remaining mediastinal structures are unremarkable. Lungs/Pleura: Lungs are adequately inflated demonstrate worsening of a moderate size right pleural effusion. Moderate consolidation over the right lower lobe slightly worse likely combination of atelectasis as well as patient's known right lung cancer. This is difficult to measure due to the adjacent consolidation and effusion. Stable mass over the anteromedial right midlung. Several small stable nodule opacities over the right upper lobe. Patchy mixed interstitial airspace density over the anterior right midlung. Worsening patchy airspace density over the superior segment of the left lower lobe which may be due to infection. Remainder of the left lung is unchanged. Biapical pleural thickening. Obstruction of several proximal right middle lobe and lower lobe bronchi which is a new finding and most likely due to aspiration or mucous plugging. Narrowing of the posterior right  pulmonary vein in the region of patient's medial right infrahilar  lung cancer. Mild emphysematous disease. Suggestion of aspirate material within the right-sided the distal trachea. Upper Abdomen: No acute findings. Musculoskeletal: No change. Review of the MIP images confirms the above findings. IMPRESSION: 1. Stable changes of patient's known right lower lobe lung cancer which is difficult to define due to the adjacent consolidation and effusion. Stable masslike consolidation over the medial aspect of the anterior right midlung abutting the pericardium. Stable right upper lobe nodules suggesting metastatic disease. Worsening moderate size right pleural effusion with slight worsening consolidation in the right base. New obstruction of right middle lobe and lower lobe bronchi likely due to aspiration or mucous plugging. Narrowing of right posterior pulmonary vein. 2. Mild worsening patchy airspace process over the superior segment left lower lobe as well as anterior right midlung which may be due to infection. 3. Previously seen small left-sided pulmonary emboli no longer visualized. No current pulmonary emboli identified. 4.  Mild emphysematous disease.  Mild cardiomegaly. Electronically Signed   By: Marin Olp M.D.   On: 04/19/2019 15:25   Dg Chest Port 1 View  Result Date: 04/19/2019 CLINICAL DATA:  Right chest wall pain.  History of lung cancer EXAM: PORTABLE CHEST 1 VIEW COMPARISON:  04/07/2019 FINDINGS: Right base airspace disease and small pleural effusion with increased pleural fluid from comparison chest x-ray. No pneumothorax. Normal heart size. IMPRESSION: 1. Small right pleural effusion with reaccumulation since 04/07/2019. 2. Right base consolidation/mass, reference 04/06/2019 CT. Electronically Signed   By: Monte Fantasia M.D.   On: 04/19/2019 04:07    Pending Labs Unresulted Labs (From admission, onward)    Start     Ordered   04/19/19 1500  Culture, blood (Routine X 2) w Reflex to ID  Panel  Once,   R     04/19/19 1500   04/19/19 1234  Blood Culture (routine x 2)  BLOOD CULTURE X 2,   STAT     04/19/19 1235   04/19/19 1234  Urine culture  ONCE - STAT,   STAT     04/19/19 1235   Signed and Held  HIV Antibody (routine testing w rflx)  (HIV Antibody (Routine testing w reflex) panel)  Once,   R     Signed and Held   Signed and Held  HIV4GL Save Tube  (HIV Antibody (Routine testing w reflex) panel)  Once,   R     Signed and Held   Signed and Held  Procalcitonin - Baseline  ONCE - STAT,   STAT     Signed and Held   Signed and Held  Procalcitonin  Daily,   R     Signed and Held   Signed and Held  Culture, sputum-assessment  Once,   R     Signed and Held   Signed and Held  Comprehensive metabolic panel  Tomorrow morning,   R     Signed and Held   Signed and Held  CBC  Tomorrow morning,   R     Signed and Held          Vitals/Pain Today's Vitals   04/19/19 1645 04/19/19 1700 04/19/19 1730 04/19/19 1749  BP:  (!) 140/97 (!) 123/92   Pulse:  (!) 150 (!) 144   Resp: (!) 31 (!) 38 (!) 35   Temp:    98.5 F (36.9 C)  TempSrc:      SpO2:  (!) 89% 95%   PainSc:        Isolation Precautions Airborne and Contact  precautions  Medications Medications  sodium chloride (PF) 0.9 % injection (has no administration in time range)  0.9 %  sodium chloride infusion (has no administration in time range)  Ampicillin-Sulbactam (UNASYN) 3 g in sodium chloride 0.9 % 100 mL IVPB (has no administration in time range)  HYDROmorphone (DILAUDID) injection 0.5 mg (0.5 mg Intravenous Given 04/19/19 1238)  sodium chloride 0.9 % bolus 500 mL (0 mLs Intravenous Stopped 04/19/19 1326)  ceFEPIme (MAXIPIME) 2 g in sodium chloride 0.9 % 100 mL IVPB (0 g Intravenous Stopped 04/19/19 1428)  sodium chloride 0.9 % bolus 1,000 mL (0 mLs Intravenous Stopped 04/19/19 1456)  iohexol (OMNIPAQUE) 350 MG/ML injection 100 mL (100 mLs Intravenous Contrast Given 04/19/19 1417)  HYDROmorphone (DILAUDID)  injection 0.5 mg (0.5 mg Intravenous Given 04/19/19 1448)  HYDROmorphone (DILAUDID) injection 0.5 mg (0.5 mg Intravenous Given 04/19/19 1709)    Mobility walks with device Low fall risk   Focused Assessments    R Recommendations: See Admitting Provider Note  Report given to:   Additional Notes:

## 2019-04-19 NOTE — Telephone Encounter (Signed)
Provider notified of call information below and CXR.    11:19 am: Verbal order received and read back from Dr. Julien Nordmann for [name] To report to ED.  Order given to spouse Malachi Paradise at this time.     "Gaylin Bulthuis Courtois's spouse Sohaib Vereen [phone]).  What does Dr. Julien Nordmann think about Tyler [name] having chest pain.  Just returned home at 0700 after going to ED last night with chest pain.  Pain pretty steady now.   Pharmacy just called to pick up pain pills ED ordered but I'll need a ride." "Coughs thick, beige phlegm frequently.  Cough drops don't help. Will ask EMS what his temperature is.  EMS examining him now.  They don't think he needs to be transported back to ED."  Audible EMS tech asking for oral thermometer says "Temp = 102.0  but its temporal, may not be accurate.  He has a coat wrapped around his head before we arrived about twenty minutes ago.  He feels warm."

## 2019-04-19 NOTE — ED Notes (Signed)
Patient transported to CT 

## 2019-04-19 NOTE — ED Notes (Signed)
EKG given to EDP,Pollina,MD., for review.

## 2019-04-19 NOTE — ED Provider Notes (Addendum)
[facility] DEPT Provider Note   CSN: [id number] Arrival date & time: 04/19/19  0213     History   Chief Complaint Chief Complaint  Patient presents with  . Chest Pain    HPI Tyler [name] is a 58 y.o. male.     Patient with history of lung cancer presents to the ER for worsening right-sided chest pain.  This pain has been ongoing with the patient.  He reports that pain has been attributed to his cancer.  He reports that he is not currently on any prescription pain medication.     Past Medical History:  Diagnosis Date  . Alcohol abuse   . Chronic back pain   . COPD (chronic obstructive pulmonary disease) ([place])   . Dyspnea   . Lung mass    mediastinal adenopathy  . Lung nodule   . Multiple pulmonary nodules 04/02/2018  . Pneumonia   . Right lower lobe pulmonary nodule 04/02/2018   12/01/2017: PET SUV 2.1  . Tobacco abuse     Patient Active Problem List   Diagnosis Date Noted  . Pleural effusion on right   . Acute pulmonary embolism ([place]) 04/07/2019  . Pulmonary embolism ([place]) 04/06/2019  . Anemia 04/06/2019  . Hypokalemia 04/06/2019  . Malignant neoplasm of bronchus of right lower lobe ([place]) 03/01/2019  . Adenocarcinoma of right lung, stage 3 ([place]) 01/17/2019  . Goals of care, counseling/discussion 01/17/2019  . Encounter for antineoplastic chemotherapy 01/17/2019  . Tobacco abuse 01/10/2019  . Mediastinal adenopathy 01/10/2019  . Right upper lobe pulmonary nodule 04/19/2018  . Multiple pulmonary nodules 04/02/2018  . Right lower lobe pulmonary nodule 04/02/2018  . Protein-calorie malnutrition, severe 08/09/2017  . Acute lung injury   . Pneumonitis   . BOOP (bronchiolitis obliterans with organizing pneumonia) ([place])   . Acute respiratory failure with hypoxia ([place]) 08/08/2017  . Hypoxia 08/07/2017  . Dehydration   . Diarrhea   . CAP (community acquired pneumonia) 08/04/2017  . Hyponatremia 08/04/2017  . Tachycardia  08/04/2017  . Influenza A     Past Surgical History:  Procedure Laterality Date  . APPENDECTOMY    . VIDEO BRONCHOSCOPY WITH ENDOBRONCHIAL NAVIGATION N/A 04/05/2018   Procedure: VIDEO BRONCHOSCOPY WITH ENDOBRONCHIAL NAVIGATION;  Surgeon: Garner Nash, DO;  Location: [place] [facility];  Service: Thoracic;  Laterality: N/A;  . VIDEO BRONCHOSCOPY WITH ENDOBRONCHIAL NAVIGATION N/A 01/11/2019   Procedure: VIDEO BRONCHOSCOPY WITH ENDOBRONCHIAL NAVIGATION;  Surgeon: Garner Nash, DO;  Location: [facility];  Service: Thoracic;  Laterality: N/A;  . VIDEO BRONCHOSCOPY WITH ENDOBRONCHIAL ULTRASOUND N/A 01/11/2019   Procedure: VIDEO BRONCHOSCOPY WITH ENDOBRONCHIAL ULTRASOUND;  Surgeon: Garner Nash, DO;  Location: [facility];  Service: Thoracic;  Laterality: N/A;  . VIDEO BRONCHOSCOPY WITH RADIAL ENDOBRONCHIAL ULTRASOUND N/A 01/11/2019   Procedure: VIDEO BRONCHOSCOPY WITH RADIAL ENDOBRONCHIAL ULTRASOUND;  Surgeon: Garner Nash, DO;  [facility];  Service: Thoracic;  Laterality: N/A;  . WISDOM TOOTH EXTRACTION          Home Medications    Prior to Admission medications   Medication Sig Start Date End Date Taking? Authorizing Provider  albuterol (PROVENTIL HFA;VENTOLIN HFA) 108 (90 Base) MCG/ACT inhaler Inhale 2 puffs into the lungs every 6 (six) hours as needed for wheezing or shortness of breath. 06/27/18  Yes Icard, Bradley L, DO  enoxaparin (LOVENOX) 100 MG/ML injection Inject 0.9 mLs (90 mg total) into the skin daily. 04/11/19 05/11/19 Yes Barb Merino, MD  feeding supplement, ENSURE ENLIVE, (ENSURE  ENLIVE) LIQD Take 237 mLs by mouth 3 (three) times daily between meals. Patient taking differently: Take 237 mLs by mouth daily.  08/11/17  Yes Ghimire, Henreitta Leber, MD  lidocaine (XYLOCAINE) 2 % solution 5 ml swallow before meals for radiation esophagitis Patient taking differently: 5 ml swallow before meals for radiation esophagitis as needed to numb 03/30/19  Yes [name], MD  Magnesium Oxide 400 MG  CAPS Take 1 capsule (400 mg total) by mouth 2 (two) times daily. 04/10/19 05/10/19 Yes Ghimire, Dante Gang, MD  potassium chloride SA (K-DUR) 20 MEQ tablet Take 1 tablet (20 mEq total) by mouth daily. 04/06/19  Yes Tish Men, MD  tiotropium (SPIRIVA HANDIHALER) 18 MCG inhalation capsule Place 1 capsule (18 mcg total) into inhaler and inhale daily. 06/27/18 01/05/20 Yes Icard, Octavio Graves, DO  oxyCODONE-acetaminophen (PERCOCET) 5-325 MG tablet Take 1-2 tablets by mouth every 4 (four) hours as needed for moderate pain or severe pain. 04/19/19   Orpah Greek, MD    Family History Family History  Problem Relation Age of Onset  . Heart attack Maternal Grandfather     Social History Social History   Tobacco Use  . Smoking status: Current Every Day Smoker    Packs/day: 0.25    Years: 40.00    Pack years: 10.00    Types: Cigarettes  . Smokeless tobacco: Never Used  Substance Use Topics  . Alcohol use: Not Currently  . Drug use: Not Currently    Types: Marijuana     Allergies   Patient has no known allergies.   Review of Systems Review of Systems   Physical Exam Updated Vital Signs BP 103/69   Pulse 91   Temp 98.2 F (36.8 C) (Oral)   Resp 17   Ht 6\' 2"  (1.88 m)   Wt 59 kg   SpO2 100%   BMI 16.69 kg/m   Physical Exam Vitals signs and nursing note reviewed.  Constitutional:      General: He is not in acute distress.    Appearance: Normal appearance. He is well-developed.  HENT:     Head: Normocephalic and atraumatic.     Right Ear: Hearing normal.     Left Ear: Hearing normal.     Nose: Nose normal.  Eyes:     Conjunctiva/sclera: Conjunctivae normal.     Pupils: Pupils are equal, round, and reactive to light.  Neck:     Musculoskeletal: Normal range of motion and neck supple.  Cardiovascular:     Rate and Rhythm: Regular rhythm. Tachycardia present.     Heart sounds: S1 normal and S2 normal. No murmur. No friction rub. No gallop.   Pulmonary:     Effort:  Pulmonary effort is normal. No respiratory distress.     Breath sounds: Normal breath sounds.  Chest:     Chest wall: Tenderness present.    Abdominal:     General: Bowel sounds are normal.     Palpations: Abdomen is soft.     Tenderness: There is no abdominal tenderness. There is no guarding or rebound. Negative signs include Murphy's sign and McBurney's sign.     Hernia: No hernia is present.  Musculoskeletal: Normal range of motion.  Skin:    General: Skin is warm and dry.     Findings: No rash.  Neurological:     Mental Status: He is alert and oriented to person, place, and time.     GCS: GCS eye subscore is 4. GCS verbal subscore is  5. GCS motor subscore is 6.     Cranial Nerves: No cranial nerve deficit.     Sensory: No sensory deficit.     Coordination: Coordination normal.  Psychiatric:        Speech: Speech normal.        Behavior: Behavior normal.        Thought Content: Thought content normal.      ED Treatments / Results  Labs (all labs ordered are listed, but only abnormal results are displayed) Labs Reviewed  CBC WITH DIFFERENTIAL/PLATELET - Abnormal; Notable for the following components:      Result Value   WBC 10.6 (*)    RBC 2.61 (*)    Hemoglobin 7.2 (*)    HCT 22.5 (*)    RDW 21.2 (*)    Neutro Abs 7.9 (*)    Monocytes Absolute 1.4 (*)    All other components within normal limits  BASIC METABOLIC PANEL - Abnormal; Notable for the following components:   Sodium 132 (*)    Chloride 96 (*)    Glucose, Bld 108 (*)    Creatinine, Ser 0.40 (*)    Calcium 8.2 (*)    All other components within normal limits    EKG EKG Interpretation  Date/Time:  Thursday April 19 2019 04:07:00 EDT Ventricular Rate:  93 PR Interval:    QRS Duration: 69 QT Interval:  380 QTC Calculation: 473 R Axis:   77 Text Interpretation:  Sinus tachycardia Multiform ventricular premature complexes Baseline wander in lead(s) V5 Confirmed by Orpah Greek [phone]) on  04/19/2019 6:31:30 AM   Radiology Dg Chest Port 1 View  Result Date: 04/19/2019 CLINICAL DATA:  Right chest wall pain.  History of lung cancer EXAM: PORTABLE CHEST 1 VIEW COMPARISON:  04/07/2019 FINDINGS: Right base airspace disease and small pleural effusion with increased pleural fluid from comparison chest x-ray. No pneumothorax. Normal heart size. IMPRESSION: 1. Small right pleural effusion with reaccumulation since 04/07/2019. 2. Right base consolidation/mass, reference 04/06/2019 CT. Electronically Signed   By: Monte Fantasia M.D.   On: 04/19/2019 04:07    Procedures Procedures (including critical care time)  Medications Ordered in ED Medications  oxyCODONE-acetaminophen (PERCOCET/ROXICET) 5-325 MG per tablet 1 tablet (has no administration in time range)  HYDROmorphone (DILAUDID) injection 1 mg (1 mg Intravenous Given 04/19/19 0416)  ondansetron (ZOFRAN) injection 4 mg (4 mg Intravenous Given 04/19/19 0416)     Initial Impression / Assessment and Plan / ED Course  I have reviewed the triage vital signs and the nursing notes.  Pertinent labs & imaging results that were available during my care of the patient were reviewed by me and considered in my medical decision making (see chart for details).        Patient presents to the emergency department for evaluation of right-sided chest pain.  This pain has been persistent for the patient for some time, as he has lung cancer which is worsening despite treatment.  He was hospitalized recently with a large pleural effusion which was drained.  Reviewing his records, he was discharged without any pain medication.  He has followed up at oncology since discharge and does not have any prescribed pain medicine.  He presents tonight with severe right-sided pain that is persistent but worsens whenever he coughs.  Chest x-ray does not show any pneumothorax or significant reaccumulation of his pleural effusion.  He was in mild distress with mild  tachycardia at arrival.  This resolved with analgesia.  His  oxygenation is 98 to 100%.  He did have PE during his last hospitalization and is currently on Lovenox.  He appears comfortable after analgesia, do not suspect worsening PE.  Will discharge patient with prescription for Percocet, follow-up with his oncologist for ongoing pain issues.  Final Clinical Impressions(s) / ED Diagnoses   Final diagnoses:  Chest wall pain  Cancer related pain    ED Discharge Orders         Ordered    oxyCODONE-acetaminophen (PERCOCET) 5-325 MG tablet  Every 4 hours PRN     04/19/19 0640           Orpah Greek, MD 04/19/19 4199    Orpah Greek, MD 04/24/19 [phone]

## 2019-04-19 NOTE — ED Triage Notes (Signed)
PT BIB [facility] reporting R chest wall pain that he reports is at the site of his lung cancer. Pain started yesterday. Endorses some SOB. EKG unremarkable per EMS.

## 2019-04-19 NOTE — ED Notes (Addendum)
Pt verbalizes understanding of DC instructions. Pt belongings returned and ambulatory out of ED

## 2019-04-19 NOTE — ED Notes (Signed)
ED Provider at bedside. 

## 2019-04-19 NOTE — ED Provider Notes (Signed)
[facility] Emergency Department Provider Note MRN:  [id number]  Arrival date & time: 04/19/19     Chief Complaint   Lung Cancer, Chills, and Lung Pain   History of Present Illness   Tyler [name] is a 58 y.o. year-old male with a history of lung cancer, COPD presenting to the ED with chief complaint of chest pain.  I was unable to obtain an accurate HPI, PMH, or ROS due to the patient's altered mental status.  Level 5 caveat.  Patient is confused, oriented to name, tells me it is 1962.  Seen last night with right-sided chest pain, which she has chronically related to lung cancer.  Review of Systems  Positive for confusion, chest pain.  Patient's Health History    Past Medical History:  Diagnosis Date  . Alcohol abuse   . Chronic back pain   . COPD (chronic obstructive pulmonary disease) ([place])   . Dyspnea   . Lung mass    mediastinal adenopathy  . Lung nodule   . Multiple pulmonary nodules 04/02/2018  . Pneumonia   . Right lower lobe pulmonary nodule 04/02/2018   12/01/2017: PET SUV 2.1  . Tobacco abuse     Past Surgical History:  Procedure Laterality Date  . APPENDECTOMY    . VIDEO BRONCHOSCOPY WITH ENDOBRONCHIAL NAVIGATION N/A 04/05/2018   Procedure: VIDEO BRONCHOSCOPY WITH ENDOBRONCHIAL NAVIGATION;  Surgeon: Garner Nash, DO;  Location: [facility];  Service: Thoracic;  Laterality: N/A;  . VIDEO BRONCHOSCOPY WITH ENDOBRONCHIAL NAVIGATION N/A 01/11/2019   Procedure: VIDEO BRONCHOSCOPY WITH ENDOBRONCHIAL NAVIGATION;  Surgeon: Garner Nash, DO;  Location: [place];  Service: Thoracic;  Laterality: N/A;  . VIDEO BRONCHOSCOPY WITH ENDOBRONCHIAL ULTRASOUND N/A 01/11/2019   Procedure: VIDEO BRONCHOSCOPY WITH ENDOBRONCHIAL ULTRASOUND;  Surgeon: Garner Nash, DO;  Location: [place];  Service: Thoracic;  Laterality: N/A;  . VIDEO BRONCHOSCOPY WITH RADIAL ENDOBRONCHIAL ULTRASOUND N/A 01/11/2019   Procedure: VIDEO BRONCHOSCOPY WITH RADIAL ENDOBRONCHIAL  ULTRASOUND;  Surgeon: Garner Nash, DO;  [facility];  Service: Thoracic;  Laterality: N/A;  . WISDOM TOOTH EXTRACTION      Family History  Problem Relation Age of Onset  . Heart attack Maternal Grandfather     Social History   Socioeconomic History  . Marital status: Married    Spouse name: Not on file  . Number of children: Not on file  . Years of education: Not on file  . Highest education level: Not on file  Occupational History  . Not on file  Social Needs  . Financial resource strain: Not on file  . Food insecurity    Worry: Not on file    Inability: Not on file  . Transportation needs    Medical: Yes    Non-medical: Yes  Tobacco Use  . Smoking status: Current Every Day Smoker    Packs/day: 0.25    Years: 40.00    Pack years: 10.00    Types: Cigarettes  . Smokeless tobacco: Never Used  Substance and Sexual Activity  . Alcohol use: Not Currently  . Drug use: Not Currently    Types: Marijuana  . Sexual activity: Not on file  Lifestyle  . Physical activity    Days per week: Not on file    Minutes per session: Not on file  . Stress: Not on file  Relationships  . Social Herbalist on phone: Not on file    Gets together: Not on file  Attends religious service: Not on file    Active member of club or organization: Not on file    Attends meetings of clubs or organizations: Not on file    Relationship status: Not on file  . Intimate partner violence    Fear of current or ex partner: Not on file    Emotionally abused: Not on file    Physically abused: Not on file    Forced sexual activity: Not on file  Other Topics Concern  . Not on file  Social History Narrative  . Not on file     Physical Exam  Vital Signs and Nursing Notes reviewed Vitals:   04/19/19 1500 04/19/19 1530  BP: 116/79 104/77  Pulse: (!) 111 93  Resp: (!) 23 15  Temp:    SpO2: 96% 98%    CONSTITUTIONAL: Chronically ill-appearing, NAD NEURO: Awake, oriented to name  and location but not year, no focal neurological deficits EYES:  eyes equal and reactive ENT/NECK:  no LAD, no JVD CARDIO: Tachycardic rate, well-perfused, normal S1 and S2 PULM: Scattered rhonchi, increased work of breathing, tachypneic GI/GU:  normal bowel sounds, non-distended, non-tender MSK/SPINE:  No gross deformities, no edema SKIN:  no rash, atraumatic PSYCH:  Appropriate speech and behavior  Diagnostic and Interventional Summary    EKG Interpretation  Date/Time:  Thursday April 19 2019 12:28:27 EDT Ventricular Rate:  129 PR Interval:    QRS Duration: 80 QT Interval:  296 QTC Calculation: 429 R Axis:   74 Text Interpretation:  Sinus arrhythmia Multiform ventricular premature complexes Borderline low voltage, extremity leads Baseline wander in lead(s) V2 V6 Confirmed by Gerlene Fee [phone]) on 04/19/2019 12:40:33 PM      Labs Reviewed  CBC - Abnormal; Notable for the following components:      Result Value   RBC 2.83 (*)    Hemoglobin 7.8 (*)    HCT 24.6 (*)    RDW 20.7 (*)    All other components within normal limits  COMPREHENSIVE METABOLIC PANEL - Abnormal; Notable for the following components:   Sodium 132 (*)    Chloride 96 (*)    Glucose, Bld 156 (*)    Creatinine, Ser 0.47 (*)    Calcium 8.2 (*)    Albumin 2.3 (*)    All other components within normal limits  BRAIN NATRIURETIC PEPTIDE - Abnormal; Notable for the following components:   B Natriuretic Peptide 136.6 (*)    All other components within normal limits  URINALYSIS, ROUTINE W REFLEX MICROSCOPIC - Abnormal; Notable for the following components:   Color, Urine AMBER (*)    APPearance CLOUDY (*)    Hgb urine dipstick SMALL (*)    Protein, ur 100 (*)    Leukocytes,Ua MODERATE (*)    WBC, UA >50 (*)    Bacteria, UA RARE (*)    All other components within normal limits  LACTIC ACID, PLASMA - Abnormal; Notable for the following components:   Lactic Acid, Venous 2.0 (*)    All other components  within normal limits  TROPONIN I (HIGH SENSITIVITY) - Abnormal; Notable for the following components:   Troponin I (High Sensitivity) 19 (*)    All other components within normal limits  CULTURE, BLOOD (ROUTINE X 2)  URINE CULTURE  SARS CORONAVIRUS 2 (HOSPITAL ORDER, [place] [facility])  CULTURE, BLOOD (ROUTINE X 2) W REFLEX TO ID PANEL  APTT  PROTIME-INR  LACTIC ACID, PLASMA  TROPONIN I (HIGH SENSITIVITY)  CT ANGIO CHEST PE W OR WO CONTRAST  Final Result    CT HEAD WO CONTRAST  Final Result      Medications  sodium chloride (PF) 0.9 % injection (has no administration in time range)  HYDROmorphone (DILAUDID) injection 0.5 mg (0.5 mg Intravenous Given 04/19/19 1238)  sodium chloride 0.9 % bolus 500 mL (0 mLs Intravenous Stopped 04/19/19 1326)  ceFEPIme (MAXIPIME) 2 g in sodium chloride 0.9 % 100 mL IVPB (0 g Intravenous Stopped 04/19/19 1428)  sodium chloride 0.9 % bolus 1,000 mL (0 mLs Intravenous Stopped 04/19/19 1456)  iohexol (OMNIPAQUE) 350 MG/ML injection 100 mL (100 mLs Intravenous Contrast Given 04/19/19 1417)  HYDROmorphone (DILAUDID) injection 0.5 mg (0.5 mg Intravenous Given 04/19/19 1448)     Procedures Critical Care Critical Care Documentation Critical care time provided by me (excluding procedures): 35 minutes  Condition necessitating critical care: Sepsis  Components of critical care management: reviewing of prior records, laboratory and imaging interpretation, frequent re-examination and reassessment of vital signs, administration of IV fluids, IV antibiotics, discussion with consulting services.    ED Course and Medical Decision Making  I have reviewed the triage vital signs and the nursing notes.  Pertinent labs & imaging results that were available during my care of the patient were reviewed by me and considered in my medical decision making (see below for details).  Patient arrives febrile, tachycardic, confused, increased work of  breathing, history of cancer, complaining of worsening chest pain or shortness of breath, recent admission for bilateral pulmonary emboli.  Code sepsis initiated, will obtain CTA.  No acute pulmonary embolism, question of infection of urine and/or lungs, will admit to hospitalist service.  Barth Kirks. [place]onia Small, [place] [email or web address]  Final Clinical Impressions(s) / ED Diagnoses     ICD-10-CM   1. Sepsis, due to unspecified organism, unspecified whether acute organ dysfunction present [facility])  A41.9     ED Discharge Orders    None      Discharge Instructions Discussed with and Provided to Patient: Discharge Instructions   None       Maudie Flakes, MD 04/19/19 1621

## 2019-04-19 NOTE — Progress Notes (Signed)
Pt arrived to unit with MEWS score of 5.  Temp 101.5, HR 128.  Afib on tele monitor.  EKG obtained and RR nurse, MD called.  C/o chest pain on arrival to unit, percocet and ibuprofen administered.  RN will monitor per MD order.

## 2019-04-19 NOTE — Significant Event (Signed)
Rapid Response Event Note  Overview: Time Called: 0705 Arrival Time: 0710 Event Type: MEWS  Initial Focused Assessment:  Called to the bedside reference red MEWS score.  T-101.5 with HR elevated in 120-130s.  Pt resting in bed, no acute respiratory distress.  Is currently having chest pain 10/10.  Chest pain increases with deep inspiration and to palpation.  Recently dx with lung cancer 6 months ago.  Patient reports the chest pain he is feeling is the same type of pain he has had in the ED this morning.  12-lead EKG done prior to my arrival states rhythm undeterminable, however R to R interval very irregular, appears as A-fib, however, Dr. Verlon Au was made aware of this by nurse and advised that it is sinus tach.  High Sensitive Trop done earlier 19, which Dr. Verlon Au is aware of.  HR at present time is averaging high 120s.  Interventions:  Dr. Verlon Au ordered continuous IVF.  I also feel increased heart rate is fever driven and to attempt to decrease fever, Advil already given.  I also ordered a 500 NS fluid bolus, despite Lactic 1.6, patient appears dehydrated.   Plan of Care (if not transferred):  Advised nurse if heart rate remains elevated after pain is treated and fever treated to re-notify rapid response.  Also if any deterioration in patient condition please re notify rapid response @ [phone].   Event Summary: Name of Physician Notified: Dr. Verlon Au (notified prior to RRT arrival) at 0700  Outcome: Stayed in room and [place] ICU/SD [facility] / [place] / Rapid Response Nurse Rapid Response Number:  [phone] ICU Charge Nurse Number:  [phone]

## 2019-04-20 ENCOUNTER — Inpatient Hospital Stay (HOSPITAL_COMMUNITY): Payer: Medicaid Other

## 2019-04-20 LAB — BLOOD CULTURE ID PANEL (REFLEXED)

## 2019-04-20 LAB — BODY FLUID CELL COUNT WITH DIFFERENTIAL
Eos, Fluid: 2 %
Lymphs, Fluid: 1 %
Monocyte-Macrophage-Serous Fluid: 3 % — ABNORMAL LOW (ref 50–90)
Neutrophil Count, Fluid: 94 % — ABNORMAL HIGH (ref 0–25)
Total Nucleated Cell Count, Fluid: UNDETERMINED cu mm (ref 0–1000)

## 2019-04-20 LAB — COMPREHENSIVE METABOLIC PANEL
ALT: 12 U/L (ref 0–44)
AST: 20 U/L (ref 15–41)
Albumin: 1.9 g/dL — ABNORMAL LOW (ref 3.5–5.0)
Alkaline Phosphatase: 52 U/L (ref 38–126)
Anion gap: 8 (ref 5–15)
BUN: 12 mg/dL (ref 6–20)
CO2: 24 mmol/L (ref 22–32)
Calcium: 7.7 mg/dL — ABNORMAL LOW (ref 8.9–10.3)
Chloride: 101 mmol/L (ref 98–111)
Creatinine, Ser: 0.38 mg/dL — ABNORMAL LOW (ref 0.61–1.24)
GFR calc Af Amer: >60 mL/min (ref >60)
GFR calc non Af Amer: >60 mL/min (ref >60)
Glucose, Bld: 95 mg/dL (ref 70–99)
Potassium: 4.2 mmol/L (ref 3.5–5.1)
Sodium: 133 mmol/L — ABNORMAL LOW (ref 135–145)
Total Bilirubin: 0.9 mg/dL (ref 0.3–1.2)
Total Protein: 5.6 g/dL — ABNORMAL LOW (ref 6.5–8.1)

## 2019-04-20 LAB — GLUCOSE, PLEURAL OR PERITONEAL FLUID: Glucose, Fluid: <20 mg/dL

## 2019-04-20 LAB — ALBUMIN, PLEURAL OR PERITONEAL FLUID: Albumin, Fluid: 1.6 g/dL

## 2019-04-20 LAB — EXPECTORATED SPUTUM ASSESSMENT W GRAM STAIN, RFLX TO RESP C

## 2019-04-20 LAB — CBC
HCT: 23.5 % — ABNORMAL LOW (ref 39.0–52.0)
Hemoglobin: 7.7 g/dL — ABNORMAL LOW (ref 13.0–17.0)
MCH: 28.2 pg (ref 26.0–34.0)
MCHC: 32.8 g/dL (ref 30.0–36.0)
MCV: 86.1 fL (ref 80.0–100.0)
Platelets: 190 10*3/uL (ref 150–400)
RBC: 2.73 MIL/uL — ABNORMAL LOW (ref 4.22–5.81)
RDW: 20.3 % — ABNORMAL HIGH (ref 11.5–15.5)
WBC: 14.6 10*3/uL — ABNORMAL HIGH (ref 4.0–10.5)
nRBC: 0 % (ref 0.0–0.2)

## 2019-04-20 LAB — LACTATE DEHYDROGENASE: LDH: 98 U/L (ref 98–192)

## 2019-04-20 LAB — URINE CULTURE

## 2019-04-20 LAB — PROCALCITONIN: Procalcitonin: 7.23 ng/mL

## 2019-04-20 LAB — PROTEIN, PLEURAL OR PERITONEAL FLUID: Total protein, fluid: 4.6 g/dL

## 2019-04-20 LAB — LACTATE DEHYDROGENASE, PLEURAL OR PERITONEAL FLUID: LD, Fluid: 1001 U/L — ABNORMAL HIGH (ref 3–23)

## 2019-04-20 LAB — GUARDANT 360

## 2019-04-20 MED ORDER — FUROSEMIDE 10 MG/ML IJ SOLN
40.0000 mg | Freq: Once | INTRAMUSCULAR | Status: AC
  Administered 2019-04-20: 40 mg via INTRAVENOUS
  Filled 2019-04-20: qty 4

## 2019-04-20 MED ORDER — BOOST / RESOURCE BREEZE PO LIQD CUSTOM
1.0000 | Freq: Two times a day (BID) | ORAL | Status: DC
  Administered 2019-04-23 – 2019-04-25 (×3): 1 via ORAL
  Administered 2019-05-01: 237 mL via ORAL

## 2019-04-20 MED ORDER — LIDOCAINE HCL 1 % IJ SOLN
INTRAMUSCULAR | Status: AC
  Administered 2019-04-20: 15:00:00
  Filled 2019-04-20: qty 10

## 2019-04-20 MED ORDER — ADULT MULTIVITAMIN W/MINERALS CH
1.0000 | ORAL_TABLET | Freq: Every day | ORAL | Status: DC
  Administered 2019-04-20 – 2019-05-02 (×13): 1 via ORAL
  Filled 2019-04-20 (×14): qty 1

## 2019-04-20 NOTE — Progress Notes (Signed)
PHARMACY - PHYSICIAN COMMUNICATION CRITICAL VALUE ALERT - BLOOD CULTURE IDENTIFICATION (BCID)  Tyler [name] is an 58 y.o. male who presented to [facility] on 04/19/2019 with a chief complaint of PNA  Assessment:  10/8 Blood Cx resulted 1 of 4 bottles with Staph species, no resistance noted  Name of physician (or Provider) Contacted: Dr Verlon Au  Current antibiotics: Unasyn 3gm q6h  Changes to prescribed antibiotics recommended: None, likely contaminant   Results for orders placed or performed during the hospital encounter of 04/19/19  Blood Culture ID Panel (Reflexed) (Collected: 04/19/2019 12:42 PM)  Result Value Ref Range   Enterococcus species NOT DETECTED NOT DETECTED   Listeria monocytogenes NOT DETECTED NOT DETECTED   Staphylococcus species DETECTED (A) NOT DETECTED   Staphylococcus aureus (BCID) NOT DETECTED NOT DETECTED   Methicillin resistance NOT DETECTED NOT DETECTED   Streptococcus species NOT DETECTED NOT DETECTED   Streptococcus agalactiae NOT DETECTED NOT DETECTED   Streptococcus pneumoniae NOT DETECTED NOT DETECTED   Streptococcus pyogenes NOT DETECTED NOT DETECTED   Acinetobacter baumannii NOT DETECTED NOT DETECTED   Enterobacteriaceae species NOT DETECTED NOT DETECTED   Enterobacter cloacae complex NOT DETECTED NOT DETECTED   Escherichia coli NOT DETECTED NOT DETECTED   Klebsiella oxytoca NOT DETECTED NOT DETECTED   Klebsiella pneumoniae NOT DETECTED NOT DETECTED   Proteus species NOT DETECTED NOT DETECTED   Serratia marcescens NOT DETECTED NOT DETECTED   Haemophilus influenzae NOT DETECTED NOT DETECTED   Neisseria meningitidis NOT DETECTED NOT DETECTED   Pseudomonas aeruginosa NOT DETECTED NOT DETECTED   Candida albicans NOT DETECTED NOT DETECTED   Candida glabrata NOT DETECTED NOT DETECTED   Candida krusei NOT DETECTED NOT DETECTED   Candida parapsilosis NOT DETECTED NOT DETECTED   Candida tropicalis NOT DETECTED NOT DETECTED    Minda Ditto  PharmD 04/20/2019  11:01 AM

## 2019-04-20 NOTE — Progress Notes (Signed)
Hospitalist progress note  MONTREY BUIST  CNO:[id number] DOB: 26-Jul-1960 DOA: 04/19/2019 PCP: Brand Males, MD  Narrative:  32 black male stage III lung cancer diagnosed 01/2019 on carboplatin p aclitaxel 5 cycles given last office visit 03/05/2019 Dr. Pamala Hurry cGy in 30 fractions XRT, recent admission 9/25 acute PE + bilateral DVT/Lovenox prior EtOH, tobacco quit smoking several months ago again Came to ED early a.m. 10/8 2020 with some chest pain malaise and right chest since chest x-ray did not show pneumothorax or effusion he was in mild distress he was discharged home with low-dose Percocet-return to ED later on T-max 103.5-CT scan concerning for possible pneumonia white count normal however Saline resuscitated 1.5 L cefepime started later during hospital stay paroxysmal A. fib monitor   Assessment & Plan: Sepsis 2/2 aspiration/postobstructive pneumonia-Rx essentially same-transition ceftriaxone given in the emergency room to Unasyn  procalcitonin down from 12-7 blood culture gram-positive cocci 1 bottle sputum culture pending urine culture pending  Acute hypoxic respiratory failure-patient oxygen requirements have increased so he has been placed on high flow nasal cannula I will get 2 view chest x-ray now to determine if there is a recurrent effusion as he has had 1 in the past-I will cut back fluids from 100 cc an hour to 50 cc an hour  Stage III lung adenocarcinoma versus stage IV status post XRT-added to Rx team Dr. Colon Branch on carboplatin paclitaxel on hold-  He also has severe cancer related cachexia with BMI of 16 and has lost weight over the past several months pain control -first choice ibuprofen 400, second choice Percocet 5/325 1-2  Recent acute pulmonary embolism with DVTs CT scan chest not showing any further embolism continue Lovenox likely lifelong further outpatient discussion oncologist  Sinus tachycardia?  Paroxysmal A. fib possible on monitors however this  seems to have resolved on 10/9 [facility] per criterion Continue to monitor on telemetry full dose Lovenox for prior pulmonary embolism DVT which should cover the same  Reflux continue Carafate 1 g 3 times daily  COPD continue albuterol 2 puffs every 6 as needed tiotropium in addition   DVT prophylaxis: Full dose Lovenox full code  Code Status:   Full code   Family Communication:   None present Disposition Plan: Await resolution-not stable at current time for discharge   Consultants:  added Dr. [name] to treatment team   Cefepime on admission and then was changed to Unasyn Antimicrobials:    Subjective: Awake coherent looks much more comfortable in no specific distress although still short of breath States chest pain is much better and had run out of medications prior to admission Abdomen is soft no rebound He is tolerating some diet--it takes him several seconds to comprehend what I am telling him I find that he does not completely grasp his medical history  Objective: Vitals:   04/19/19 2319 04/19/19 2321 04/20/19 0017 04/20/19 0412  BP: 95/70 95/70 96/62  99/68  Pulse: 93 93 90 89  Resp: 16 16 16 16   Temp: (!) 97.4 F (36.3 C) (!) 97.4 F (36.3 C) (!) 97.4 F (36.3 C) 97.6 F (36.4 C)  TempSrc: Oral Oral Oral Oral  SpO2: 93%  91% 91%  Weight:  59 kg    Height:  6' 2.02" (1.88 m)      Intake/Output Summary (Last 24 hours) at 04/20/2019 0941 Last data filed at 04/20/2019 0423 Gross per 24 hour  Intake 880.81 ml  Output 375 ml  Net 505.81 ml   [employer]  04/19/19 2321  Weight: 59 kg   Cachectic frail African-American male looking much older than stated age no icterus no pallor Abdomen scaphoid nontender nondistended no rebound no guarding Chest clinically without rales rhonchi although quiet posterolaterally cannot appreciate any TV or TVF No submandibular lymphadenopathy no JVD No lower extremity edema ROM intact moving all 4 limbs equally Neurologically  intact coherent somewhat slow to respond however does not seem to completely grasp what I am saying.  Data Reviewed: I have personally reviewed following labs and imaging studies CBC: Recent Labs  Lab 04/19/19 0424 04/19/19 1242 04/20/19 0522  WBC 10.6* 10.4 14.6*  NEUTROABS 7.9*  --   --   HGB 7.2* 7.8* 7.7*  HCT 22.5* 24.6* 23.5*  MCV 86.2 86.9 86.1  PLT 215 225 740   Basic Metabolic Panel: Recent Labs  Lab 04/19/19 0424 04/19/19 1242 04/20/19 0522  NA 132* 132* 133*  K 4.0 4.0 4.2  CL 96* 96* 101  CO2 26 25 24   GLUCOSE 108* 156* 95  BUN 13 14 12   CREATININE 0.40* 0.47* 0.38*  CALCIUM 8.2* 8.2* 7.7*   GFR: Estimated Creatinine Clearance: 81.9 mL/min (A) (by C-G formula based on SCr of 0.38 mg/dL (L)). Liver Function Tests: Recent Labs  Lab 04/19/19 1242 04/20/19 0522  AST 17 20  ALT 16 12  ALKPHOS 82 52  BILITOT 0.8 0.9  PROT 6.9 5.6*  ALBUMIN 2.3* 1.9*   No results for input(s): LIPASE, AMYLASE in the last 168 hours. No results for input(s): AMMONIA in the last 168 hours. Coagulation Profile: Recent Labs  Lab 04/19/19 1242  INR 1.2   Cardiac Enzymes: Radiology Studies: Reviewed images personally in health database  Scheduled Meds: . enoxaparin  90 mg Subcutaneous Q24H  . feeding supplement (ENSURE ENLIVE)  237 mL Oral Daily  . magnesium oxide  400 mg Oral BID  . potassium chloride SA  20 mEq Oral Daily  . sucralfate  1 g Oral TID WC & HS  . umeclidinium bromide  1 puff Inhalation Daily   Continuous Infusions: . sodium chloride 100 mL/hr at 04/20/19 0005  . ampicillin-sulbactam (UNASYN) IV 3 g (04/20/19 0523)    LOS: 1 day   Time spent: [place], MD [facility]  If 7PM-7AM, please contact night-coverage-look on AMION to find my number otherwise-prefer pages-not epic chat,please 04/20/2019, 9:41 AM

## 2019-04-20 NOTE — Progress Notes (Signed)
Initial Nutrition Assessment  DOCUMENTATION CODES:   Underweight, Non-severe (moderate) malnutrition in context of chronic illness  INTERVENTION:  - will d/c Ensure per patient preference. - will order Boost Breeze BID, each supplement provides 250 kcal and 9 grams of protein. - will order [employer] with whole milk for breakfast and lunch meals, each supplement will provide 293 kcal and 17 grams protein. - continue to encourage PO intakes.    NUTRITION DIAGNOSIS:   Moderate Malnutrition related to cancer and cancer related treatments, chronic illness as evidenced by severe muscle depletion, moderate fat depletion, moderate muscle depletion.  GOAL:   Patient will meet greater than or equal to 90% of their needs  MONITOR:   PO intake, Supplement acceptance, Labs, Weight trends  REASON FOR ASSESSMENT:   Malnutrition Screening Tool  ASSESSMENT:   58 year-old male with medical history of stage 3 lunch cancer (dx 01/2019) on carboplatin and paclitaxel (s/p 5 cycles with last on 8/24) and XRT, alcohol abuse, and previous tobacco abuse which he quit several months ago. He was admitted in September for acute PE and bilateral DVT. He presented to the ED on 10/8 due to R chest pain and malaise. CXR did not show pneumothorax or effusion.  No intakes documented since admission. Patient reports that his appetite has been very poor since cancer diagnosis in July. He mainly nibbles throughout the day and is unable to eat a large meal at one time. He has been experiencing some abdominal discomfort with PO intakes but these are limited if he does not eat large quantities. Ensure was ordered yesterday evening but patient refusing supplement. He reports that he has diarrhea when he drinks it. Will trial supplements outlined above. He also reports that he often has pain with swallowing and needs to use a numbing agent. He is unable to tolerate items unless they are very soft, no hard or  "jaggy" pieces. He does not have any issues with soft items or liquids and does not feel that items get stuck when he swallows.   He has not been seen by SLP yet this admission/SLP unable to see him this AM d/t patient being NPO for thoracentesis. Patient was previously seen by a RD here at [facility] on 9/27 and was being followed by [facility] RD previously with notes on 7/30 and 8/24.   Per chart review, current weight is 130 lb and weight on 02/19/19 was 136 lb. This indicates 6 lb weight loss (4.4% body weight) in the past 2 months. This is not significant for time frame. Will monitor weight trends, especially after thoracentesis.   Per notes: - sepsis 2/2 aspiration/post-obstructive PNA - acute hypoxic respiratory failure - stage 3 vs stage 4 adenocarcinoma s/p XRT - may not fully understand his medical history   Labs reviewed; Na: 133 mmol/l, creatinine: 0.38 mg/dl, Ca: 7.7 mg/dl.  Medications reviewed; 40 mg IV lasix x1 dose 10/9, 400 mg mag-ox BID, 20 mEq Klor-Con/day, 1 g carafate TID.      NUTRITION - FOCUSED PHYSICAL EXAM:    Most Recent Value  Orbital Region  Moderate depletion  Upper Arm Region  Moderate depletion  Thoracic and Lumbar Region  Unable to assess  Buccal Region  Moderate depletion  Temple Region  Moderate depletion  Clavicle Bone Region  Severe depletion  Clavicle and Acromion Bone Region  Severe depletion  Scapular Bone Region  Unable to assess  Dorsal Hand  Moderate depletion  Patellar Region  Moderate depletion  Anterior Thigh  Region  Severe depletion  Posterior Calf Region  Severe depletion  Edema (RD Assessment)  None  Hair  Reviewed  Eyes  Reviewed  Mouth  Reviewed  Skin  Reviewed  Nails  Reviewed       Diet Order:   Diet Order            DIET SOFT Room service appropriate? Yes; Fluid consistency: Thin  Diet effective now              EDUCATION NEEDS:   Not appropriate for education at this time  Skin:  Skin Assessment: Reviewed RN  Assessment  Last BM:  10/7  Height:   Ht Readings from Last 1 Encounters:  04/19/19 6' 2.02" (1.88 m)    Weight:   Wt Readings from Last 1 Encounters:  04/19/19 59 kg    Ideal Body Weight:  86.4 kg  BMI:  Body mass index is 16.68 kg/m.  Estimated Nutritional Needs:   Kcal:  2000-2200 kcal  Protein:  100-115 grams  Fluid:  >/= 2 L/day     Jarome Matin, MS, RD, LDN, [facility] Inpatient Clinical Dietitian Pager # [phone] After hours/weekend pager # [phone]

## 2019-04-20 NOTE — Progress Notes (Signed)
PT Cancellation Note  Patient Details Name: TYSHEEM ACCARDO MRN: [id number] DOB: 1961-05-03   Cancelled Treatment:    Reason Eval/Treat Not Completed: Patient at procedure or test/unavailable  Julien Girt, PT Acute Rehabilitation Services Pager [phone]  Office [phone]   Danitza Schoenfeldt D Elonda Husky 04/20/2019, 3:12 PM

## 2019-04-20 NOTE — Procedures (Signed)
Ultrasound-guided diagnostic and therapeutic right thoracentesis performed yielding 1.2 liters of hazy, yellow  fluid. No immediate complications. Follow-up chest x-ray pending.  The fluid was submitted to the lab for preordered studies.  The pleural collection was multiloculated.EBL none.

## 2019-04-20 NOTE — Evaluation (Signed)
SLP Cancellation Note  Patient Details Name: Tyler [name] MRN: [id number] DOB: 1961-03-14   Cancelled treatment:        pt now npo pending procedure for thoracentesis per MD. Pt denies dysphagia - states has improved since treatment effects of chemo/XRT have worn off.  Will continue efforts.  Luanna Salk, MS [facility] SLP Acute Rehab Services Pager [phone] Office [phone]    Macario Golds 04/20/2019, 11:49 AM

## 2019-04-20 NOTE — Progress Notes (Signed)
Pt refused to take his Incruse QD. Pt states he has Spiriva QD at home that he has not even took.

## 2019-04-21 ENCOUNTER — Inpatient Hospital Stay (HOSPITAL_COMMUNITY): Payer: Medicaid Other

## 2019-04-21 DIAGNOSIS — E440 Moderate protein-calorie malnutrition: Secondary | ICD-10-CM | POA: Insufficient documentation

## 2019-04-21 LAB — COMPREHENSIVE METABOLIC PANEL
ALT: 12 U/L (ref 0–44)
AST: 16 U/L (ref 15–41)
Albumin: 1.8 g/dL — ABNORMAL LOW (ref 3.5–5.0)
Alkaline Phosphatase: 68 U/L (ref 38–126)
Anion gap: 10 (ref 5–15)
BUN: 11 mg/dL (ref 6–20)
CO2: 27 mmol/L (ref 22–32)
Calcium: 7.9 mg/dL — ABNORMAL LOW (ref 8.9–10.3)
Chloride: 98 mmol/L (ref 98–111)
Creatinine, Ser: 0.46 mg/dL — ABNORMAL LOW (ref 0.61–1.24)
GFR calc Af Amer: >60 mL/min (ref >60)
GFR calc non Af Amer: >60 mL/min (ref >60)
Glucose, Bld: 123 mg/dL — ABNORMAL HIGH (ref 70–99)
Potassium: 3.5 mmol/L (ref 3.5–5.1)
Sodium: 135 mmol/L (ref 135–145)
Total Bilirubin: 0.5 mg/dL (ref 0.3–1.2)
Total Protein: 5.7 g/dL — ABNORMAL LOW (ref 6.5–8.1)

## 2019-04-21 LAB — CBC WITH DIFFERENTIAL/PLATELET
Abs Immature Granulocytes: 0.27 10*3/uL — ABNORMAL HIGH (ref 0.00–0.07)
Basophils Absolute: 0 10*3/uL (ref 0.0–0.1)
Basophils Relative: 0 %
Eosinophils Absolute: 0 10*3/uL (ref 0.0–0.5)
Eosinophils Relative: 0 %
HCT: 25.5 % — ABNORMAL LOW (ref 39.0–52.0)
Hemoglobin: 8 g/dL — ABNORMAL LOW (ref 13.0–17.0)
Immature Granulocytes: 2 %
Lymphocytes Relative: 5 %
Lymphs Abs: 0.8 10*3/uL (ref 0.7–4.0)
MCH: 27.8 pg (ref 26.0–34.0)
MCHC: 31.4 g/dL (ref 30.0–36.0)
MCV: 88.5 fL (ref 80.0–100.0)
Monocytes Absolute: 0.8 10*3/uL (ref 0.1–1.0)
Monocytes Relative: 4 %
Neutro Abs: 15.1 10*3/uL — ABNORMAL HIGH (ref 1.7–7.7)
Neutrophils Relative %: 89 %
Platelets: 232 10*3/uL (ref 150–400)
RBC: 2.88 MIL/uL — ABNORMAL LOW (ref 4.22–5.81)
RDW: 20.3 % — ABNORMAL HIGH (ref 11.5–15.5)
WBC: 17 10*3/uL — ABNORMAL HIGH (ref 4.0–10.5)
nRBC: 0 % (ref 0.0–0.2)

## 2019-04-21 LAB — CULTURE, BLOOD (ROUTINE X 2): Special Requests: ADEQUATE

## 2019-04-21 LAB — GRAM STAIN

## 2019-04-21 LAB — PROCALCITONIN: Procalcitonin: 9.45 ng/mL

## 2019-04-21 MED ORDER — METOPROLOL TARTRATE 5 MG/5ML IV SOLN
5.0000 mg | Freq: Once | INTRAVENOUS | Status: AC
  Administered 2019-04-21: 5 mg via INTRAVENOUS
  Filled 2019-04-21: qty 5

## 2019-04-21 NOTE — Progress Notes (Signed)
Hospitalist progress note  Tyler [name]  AYT:[id number] DOB: [birth year] DOA: 04/19/2019 PCP: Brand Males, MD  Narrative:  86 black male stage III lung cancer diagnosed 01/2019 on carboplatin paclitaxel 5 cycles given last office visit 03/05/2019 Dr. Pamala Hurry cGy in 30 fractions XRT, recent admission 9/25 acute PE + bilateral DVT/Lovenox prior EtOH, tobacco quit smoking several months ago again Came to ED early a.m. 10/8 2020 with some chest pain malaise and right chest since chest x-ray did not show pneumothorax or effusion he was in mild distress he was discharged home with low-dose Percocet-return to ED later on T-max 103.5-CT scan concerning for possible pneumonia white count normal however Saline resuscitated 1.5 L cefepime started later during hospital stay paroxysmal A. fib  Developed rapid infusion which was tapped 10/9  Assessment & Plan: Sepsis 2/2 aspiration/postobstructive pneumonia-continue Unasyn -labs a.m., CXR as below--White count up monitor trends-broaden coverage if concerns for fever or worsening  Acute hypoxic respiratory failure-CXR 2 VW 10/9 = exudative effusion-thoracentesis 1.2 L-continue antibiotics-recheck CXR LDH and protein suggest exudative pleural effusion probably infection + malignancy related  Stage III lung adenocarcinoma versus stage IV status post XRT-added to Rx team Dr. Colon Branch on carboplatin paclitaxel on hold-  severe cancer related cachexia with BMI of 16  pain control -first choice ibuprofen 400, second choice Percocet 5/325 1-2  Recent acute pulmonary embolism with DVTs CT scan chest not showing any further embolism continue Lovenox likely lifelong further outpatient discussion oncologist  Sinus tachycardia?  Paroxysmal A. fib possible on monitors however this seems to have resolved on 10/9 [facility]H per criterion Continue to monitor on telemetry full dose Lovenox for prior pulmonary embolism DVT which should cover the same  Reflux  continue Carafate 1 g 3 times daily  COPD continue albuterol 2 puffs every 6 as needed tiotropium in addition   DVT prophylaxis: Full dose Lovenox full code  Code Status:   Full code   Family Communication:   None present Disposition Plan: Still not ready for discharge somewhat short of breath coughing still await cytology from pleural fluid, cultures to guide therapy  Consultants:  added Dr. [name] to treatment team   Cefepime on admission and then was changed to Unasyn Antimicrobials:    Subjective: Short of breath still to some degree however overall improved + Sputum, + cough, + dyspnea - Fever - chill - diarrhea  Objective: Vitals:   04/20/19 1843 04/20/19 2113 04/21/19 0443 04/21/19 0452  BP:  109/72 118/71   Pulse:  96 94   Resp:  16 14   Temp:  (!) 97.5 F (36.4 C) 97.7 F (36.5 C)   TempSrc:      SpO2: 100% 96% (!) 86% 91%  Weight:      Height:        Intake/Output Summary (Last 24 hours) at 04/21/2019 1032 Last data filed at 04/21/2019 0900 Gross per 24 hour  Intake 400 ml  Output 1975 ml  Net -1575 ml   Filed Weights   04/19/19 2321  Weight: 59 kg   Cachectic pleasant no distress bitemporal wasting Decreased air entry right posterior lung fields with no fremitus Abdomen soft nontender no rebound no guarding Neurologically intact moving all 4 limbs no lower extremity edema Neurologically intact  Data Reviewed: I have personally reviewed following labs and imaging studies CBC: Recent Labs  Lab 04/19/19 0424 04/19/19 1242 04/20/19 0522 04/21/19 0636  WBC 10.6* 10.4 14.6* 17.0*  NEUTROABS 7.9*  --   --  15.1*  HGB 7.2* 7.8* 7.7* 8.0*  HCT 22.5* 24.6* 23.5* 25.5*  MCV 86.2 86.9 86.1 88.5  PLT 215 225 190 211   Basic Metabolic Panel: Recent Labs  Lab 04/19/19 0424 04/19/19 1242 04/20/19 0522 04/21/19 0636  NA 132* 132* 133* 135  K 4.0 4.0 4.2 3.5  CL 96* 96* 101 98  CO2 26 25 24 27   GLUCOSE 108* 156* 95 123*  BUN 13 14 12 11    CREATININE 0.40* 0.47* 0.38* 0.46*  CALCIUM 8.2* 8.2* 7.7* 7.9*   GFR: Estimated Creatinine Clearance: 81.9 mL/min (A) (by C-G formula based on SCr of 0.46 mg/dL (L)). Liver Function Tests: Recent Labs  Lab 04/19/19 1242 04/20/19 0522 04/21/19 0636  AST 17 20 16   ALT 16 12 12   ALKPHOS 82 52 68  BILITOT 0.8 0.9 0.5  PROT 6.9 5.6* 5.7*  ALBUMIN 2.3* 1.9* 1.8*   No results for input(s): LIPASE, AMYLASE in the last 168 hours. No results for input(s): AMMONIA in the last 168 hours. Coagulation Profile: Recent Labs  Lab 04/19/19 1242  INR 1.2   Cardiac Enzymes: Radiology Studies: Reviewed images personally in health database  Scheduled Meds: . enoxaparin  90 mg Subcutaneous Q24H  . feeding supplement  1 Container Oral BID BM  . magnesium oxide  400 mg Oral BID  . multivitamin with minerals  1 tablet Oral Daily  . potassium chloride SA  20 mEq Oral Daily  . sucralfate  1 g Oral TID WC & HS  . umeclidinium bromide  1 puff Inhalation Daily   Continuous Infusions: . ampicillin-sulbactam (UNASYN) IV 3 g (04/21/19 0545)    LOS: 2 days   Time spent: [place], MD [facility]  If 7PM-7AM, please contact night-coverage-look on AMION to find my number otherwise-prefer pages-not epic chat,please 04/21/2019, 10:32 AM

## 2019-04-21 NOTE — Evaluation (Signed)
Physical Therapy Evaluation Patient Details Name: Tyler [name] MRN: [id number] DOB: [birth year] Today's Date: 04/21/2019   History of Present Illness  58 yo male admitted to ED on 10/8 with R chest wall pain due to lung cancer. Thoracentesis 10/9 yielding 1.2 L yellow fluid. PMH includes alcohol and tobacco use, COPD, PNA, PE.  Clinical Impression   Pt presents with generalized weakness, increased time and effort to perform mobility tasks, unsteadiness in standing, and very decreased activity tolerance. Pt to benefit from acute PT to address deficits. Pt tolerated stand pivot to recliner only on eval, where at baseline pt ambulates ~50 ft in home at a time. SpO2 100% on 3LO2, HR 78 bpm immediately post-transfer. PT recommending SNF level of care post-acutely, as pt has decreased caregiver support. PT to progress mobility as tolerated, and will continue to follow acutely.     Follow Up Recommendations SNF    Equipment Recommendations  None recommended by PT    Recommendations for Other Services       Precautions / Restrictions Precautions Precautions: Fall Precaution Comments: sats Restrictions Weight Bearing Restrictions: No      Mobility  Bed Mobility Overal bed mobility: Needs Assistance Bed Mobility: Supine to Sit     Supine to sit: Min guard;HOB elevated     General bed mobility comments: Min guard for safety, increased time and effort.  Transfers Overall transfer level: Needs assistance Equipment used: Straight cane Transfers: Sit to/from [employer] Transfers Sit to Stand: Min assist;From elevated surface Stand pivot transfers: Min assist       General transfer comment: Min assist for steadying during sit to stand and stand pivot to recliner, pt unsteady and reaching for environment to self-steady even with cane use.  Ambulation/Gait Ambulation/Gait assistance: (pt declines)              Stairs            Wheelchair Mobility     Modified Rankin (Stroke Patients Only)       Balance Overall balance assessment: Needs assistance Sitting-balance support: No upper extremity supported;Feet supported Sitting balance-Leahy Scale: Fair     Standing balance support: Single extremity supported Standing balance-Leahy Scale: Fair Standing balance comment: able to stand with cane use, dynamically requires steadying assist                             Pertinent Vitals/Pain Pain Assessment: No/denies pain    Home Living Family/patient expects to be discharged to:: Private residence Living Arrangements: Spouse/significant other Available Help at Discharge: Other (Comment)(per pt, "my wife says she doesn't want to help me anymore"; endorses he has no one else to assist him as needed) Type of Home: Apartment Home Access: Stairs to enter   Entrance Stairs-Number of Steps: 1 threshold step Home Layout: One level Home Equipment: Cane - single point      Prior Function Level of Independence: Independent with assistive device(s)         Comments: Pt reports using cane for ambulation PTA, was having difficulty walking to and from bathroom due to dyspnea. Pt reports 0 falls.     Hand Dominance   Dominant Hand: Right    Extremity/Trunk Assessment   Upper Extremity Assessment Upper Extremity Assessment: Generalized weakness    Lower Extremity Assessment Lower Extremity Assessment: Generalized weakness    Cervical / Trunk Assessment Cervical / Trunk Assessment: Kyphotic(cachectic appearance)  Communication   Communication: No difficulties  Cognition Arousal/Alertness: Awake/alert Behavior During Therapy: WFL for tasks assessed/performed;Flat affect Overall Cognitive Status: No family/caregiver present to determine baseline cognitive functioning                                 General Comments: Pt requires increased processing time, presents with flat affect.      General  Comments      Exercises     Assessment/Plan    PT Assessment Patient needs continued PT services  PT Problem List Decreased strength;Decreased mobility;Decreased safety awareness;Decreased range of motion;Decreased activity tolerance;Decreased balance;Decreased knowledge of use of DME;Pain;Cardiopulmonary status limiting activity       PT Treatment Interventions DME instruction;Therapeutic activities;Gait training;Therapeutic exercise;Patient/family education;Balance training;Functional mobility training    PT Goals (Current goals can be found in the Care Plan section)  Acute Rehab PT Goals Patient Stated Goal: work on my endurance PT Goal Formulation: With patient Time For Goal Achievement: 05/05/19 Potential to Achieve Goals: Good    Frequency Min 2X/week   Barriers to discharge Decreased caregiver support per pt, "my wife says she doesn't want to help me anymore"; endorses he has no one else to assist him as needed    Co-evaluation               AM-PAC PT "6 Clicks" Mobility  Outcome Measure Help needed turning from your back to your side while in a flat bed without using bedrails?: A Little Help needed moving from lying on your back to sitting on the side of a flat bed without using bedrails?: A Little Help needed moving to and from a bed to a chair (including a wheelchair)?: A Little Help needed standing up from a chair using your arms (e.g., wheelchair or bedside chair)?: A Little Help needed to walk in hospital room?: A Lot Help needed climbing 3-5 steps with a railing? : Total 6 Click Score: 15    End of Session Equipment Utilized During Treatment: Oxygen(3LO2; pt stood before PT could apply gait belt) Activity Tolerance: Patient limited by fatigue Patient left: in chair;with call bell/phone within reach(chair alarm pad placed, RN informed that chair alarm needs batteries) Nurse Communication: Mobility status PT Visit Diagnosis: Other abnormalities of gait  and mobility (R26.89);Muscle weakness (generalized) (M62.81);Unsteadiness on feet (R26.81)    Time: 1450-1506 PT Time Calculation (min) (ACUTE ONLY): 16 min   Charges:   PT Evaluation $PT Eval Low Complexity: 1 Low         Masud Holub Conception Chancy, PT Acute Rehabilitation Services Pager [phone]  Office [phone]  Onix Jumper D Elonda Husky 04/21/2019, 4:26 PM

## 2019-04-22 LAB — CBC WITH DIFFERENTIAL/PLATELET
Abs Immature Granulocytes: 0.14 10*3/uL — ABNORMAL HIGH (ref 0.00–0.07)
Basophils Absolute: 0.1 10*3/uL (ref 0.0–0.1)
Basophils Relative: 0 %
Eosinophils Absolute: 0.1 10*3/uL (ref 0.0–0.5)
Eosinophils Relative: 1 %
HCT: 27.5 % — ABNORMAL LOW (ref 39.0–52.0)
Hemoglobin: 8.7 g/dL — ABNORMAL LOW (ref 13.0–17.0)
Immature Granulocytes: 1 %
Lymphocytes Relative: 7 %
Lymphs Abs: 1.2 10*3/uL (ref 0.7–4.0)
MCH: 27.4 pg (ref 26.0–34.0)
MCHC: 31.6 g/dL (ref 30.0–36.0)
MCV: 86.5 fL (ref 80.0–100.0)
Monocytes Absolute: 1 10*3/uL (ref 0.1–1.0)
Monocytes Relative: 6 %
Neutro Abs: 14.9 10*3/uL — ABNORMAL HIGH (ref 1.7–7.7)
Neutrophils Relative %: 85 %
Platelets: 258 10*3/uL (ref 150–400)
RBC: 3.18 MIL/uL — ABNORMAL LOW (ref 4.22–5.81)
RDW: 20.1 % — ABNORMAL HIGH (ref 11.5–15.5)
WBC: 17.4 10*3/uL — ABNORMAL HIGH (ref 4.0–10.5)
nRBC: 0 % (ref 0.0–0.2)

## 2019-04-22 LAB — CULTURE, RESPIRATORY W GRAM STAIN: Culture: NORMAL

## 2019-04-22 LAB — BASIC METABOLIC PANEL
Anion gap: 11 (ref 5–15)
BUN: 11 mg/dL (ref 6–20)
CO2: 27 mmol/L (ref 22–32)
Calcium: 7.9 mg/dL — ABNORMAL LOW (ref 8.9–10.3)
Chloride: 97 mmol/L — ABNORMAL LOW (ref 98–111)
Creatinine, Ser: 0.54 mg/dL — ABNORMAL LOW (ref 0.61–1.24)
GFR calc Af Amer: >60 mL/min (ref >60)
GFR calc non Af Amer: >60 mL/min (ref >60)
Glucose, Bld: 94 mg/dL (ref 70–99)
Potassium: 3.5 mmol/L (ref 3.5–5.1)
Sodium: 135 mmol/L (ref 135–145)

## 2019-04-22 MED ORDER — MORPHINE SULFATE (PF) 2 MG/ML IV SOLN
1.0000 mg | INTRAVENOUS | Status: DC | PRN
  Administered 2019-04-22 – 2019-04-23 (×2): 2 mg via INTRAVENOUS
  Filled 2019-04-22 (×2): qty 1

## 2019-04-22 NOTE — Progress Notes (Signed)
Hospitalist progress note  Tyler [name]  NAT:[id number] DOB: Apr 08, 1961 DOA: 04/19/2019 PCP: Brand Males, MD  Narrative:  35 black male stage III lung cancer diagnosed 01/2019 on carboplatin paclitaxel 5 cycles given last office visit 03/05/2019 Dr. Pamala Hurry cGy in 30 fractions XRT, recent admission 9/25 acute PE + bilateral DVT/Lovenox prior EtOH, tobacco quit smoking several months ago again Came to ED early a.m. 10/8 2020 with some chest pain malaise and right chest since chest x-ray did not show pneumothorax or effusion he was in mild distress he was discharged home with low-dose Percocet-return to ED later on T-max 103.5-CT scan concerning for possible pneumonia white count normal however Saline resuscitated 1.5 L cefepime started later during hospital stay paroxysmal A. fib  Developed rapid infusion which was tapped 10/9  Assessment & Plan: Sepsis 2/2 aspiration/postobstructive pneumonia-continue Unasyn -we will continue current treatment without change today as no real improvement-I am not sure if he has postobstructive process or if his cancer is causing him to have discomfort in this area  Acute hypoxic respiratory failure-CXR 2 VW 10/9 = exudative effusion-thoracentesis 1.2 L performed 10/9-continue antibiotics-repeat chest x-rays 10/10 show accumulation rather rapidly -given multiple loculations, d/w Dr. Ricard Dillon thoracic surgery with regards to management-doesn't seem to think any options for Pleurodesis- defer to IR to discuss with patient further options. LDH and protein suggest exudative pleural effusion probably infection + malignancy related illness-pathology still pending  Stage III lung adenocarcinoma versus stage IV status post XRT-added to Rx team Dr. Colon Branch on carboplatin paclitaxel on hold-  severe cancer related cachexia with BMI of 16  pain control -first choice ibuprofen 400, second choice Percocet 5/325 1-2---patient is in pain and I have added morphine to his  Percocet  Recent acute pulmonary embolism with DVTs CT scan chest not showing any further embolism continue Lovenox likely lifelong further outpatient discussion oncologist  Sinus tachycardia?  Paroxysmal A. fib possible on monitors however this seems to have resolved on 10/9 LVH per criterion Continue to monitor on telemetry full dose Lovenox for prior pulmonary embolism DVT which should cover the same  Reflux continue Carafate 1 g 3 times daily  COPD continue albuterol 2 puffs every 6 as needed tiotropium in addition   DVT prophylaxis: Full dose Lovenox full code  Code Status:   Full code   Family Communication:   None present Disposition Plan: Chest x-ray shows relatively rapid reaccumulation of fluid-await IR input regarding further options to management-he is not ready for discharge  Consultants:  added Dr. [name] to treatment team   Cefepime on admission and then was changed to Unasyn Antimicrobials:    Subjective: + Chest pain front and back of chest over lung cancer area - Fever - chills - nausea - vomiting-oxygen on 2 L still has not required increase in flow rate for several days  Objective: Vitals:   04/21/19 2131 04/22/19 0004 04/22/19 0519 04/22/19 0925  BP: 124/79 (!) 151/87 104/70 110/70  Pulse: (!) 116 82 (!) 104 (!) 104  Resp: 18 18 19 18   Temp: 98.3 F (36.8 C) 98.3 F (36.8 C) 98.2 F (36.8 C) 98.8 F (37.1 C)  TempSrc: Oral Oral Oral Oral  SpO2: 91% 95% 100% (!) 82%  Weight:      Height:        Intake/Output Summary (Last 24 hours) at 04/22/2019 1159 Last data filed at 04/22/2019 0736 Gross per 24 hour  Intake 480 ml  Output 1000 ml  Net -520 ml   Filed  Weights   04/19/19 2321  Weight: 59 kg    Cachectic No increased work of breathing EOMI NCAT Moderate dentition Decreased resonance fremitus right posterior lung fields Abdomen soft no rebound Burn marks over front and back of chest  neurologically intact moving all 4 limbs  without deficit  Data Reviewed: I have personally reviewed following labs and imaging studies CBC: Recent Labs  Lab 04/19/19 0424 04/19/19 1242 04/20/19 0522 04/21/19 0636 04/22/19 0552  WBC 10.6* 10.4 14.6* 17.0* 17.4*  NEUTROABS 7.9*  --   --  15.1* 14.9*  HGB 7.2* 7.8* 7.7* 8.0* 8.7*  HCT 22.5* 24.6* 23.5* 25.5* 27.5*  MCV 86.2 86.9 86.1 88.5 86.5  PLT 215 225 190 232 937   Basic Metabolic Panel: Recent Labs  Lab 04/19/19 0424 04/19/19 1242 04/20/19 0522 04/21/19 0636 04/22/19 0552  NA 132* 132* 133* 135 135  K 4.0 4.0 4.2 3.5 3.5  CL 96* 96* 101 98 97*  CO2 26 25 24 27 27   GLUCOSE 108* 156* 95 123* 94  BUN 13 14 12 11 11   CREATININE 0.40* 0.47* 0.38* 0.46* 0.54*  CALCIUM 8.2* 8.2* 7.7* 7.9* 7.9*   GFR: Estimated Creatinine Clearance: 81.9 mL/min (A) (by C-G formula based on SCr of 0.54 mg/dL (L)). Liver Function Tests: Recent Labs  Lab 04/19/19 1242 04/20/19 0522 04/21/19 0636  AST 17 20 16   ALT 16 12 12   ALKPHOS 82 52 68  BILITOT 0.8 0.9 0.5  PROT 6.9 5.6* 5.7*  ALBUMIN 2.3* 1.9* 1.8*   No results for input(s): LIPASE, AMYLASE in the last 168 hours. No results for input(s): AMMONIA in the last 168 hours. Coagulation Profile: Recent Labs  Lab 04/19/19 1242  INR 1.2   Cardiac Enzymes: Radiology Studies: Reviewed images personally in health database  Scheduled Meds: . enoxaparin  90 mg Subcutaneous Q24H  . feeding supplement  1 Container Oral BID BM  . magnesium oxide  400 mg Oral BID  . multivitamin with minerals  1 tablet Oral Daily  . potassium chloride SA  20 mEq Oral Daily  . sucralfate  1 g Oral TID WC & HS  . umeclidinium bromide  1 puff Inhalation Daily   Continuous Infusions: . ampicillin-sulbactam (UNASYN) IV 3 g (04/22/19 1105)    LOS: 3 days   Time spent: [place], MD [facility]  If 7PM-7AM, please contact night-coverage-look on AMION to find my number otherwise-prefer pages-not epic chat,please 04/22/2019,  11:59 AM

## 2019-04-23 ENCOUNTER — Inpatient Hospital Stay (HOSPITAL_COMMUNITY): Payer: Medicaid Other

## 2019-04-23 LAB — BASIC METABOLIC PANEL
Anion gap: 13 (ref 5–15)
BUN: 11 mg/dL (ref 6–20)
CO2: 26 mmol/L (ref 22–32)
Calcium: 8.2 mg/dL — ABNORMAL LOW (ref 8.9–10.3)
Chloride: 97 mmol/L — ABNORMAL LOW (ref 98–111)
Creatinine, Ser: 0.47 mg/dL — ABNORMAL LOW (ref 0.61–1.24)
GFR calc Af Amer: >60 mL/min (ref >60)
GFR calc non Af Amer: >60 mL/min (ref >60)
Glucose, Bld: 90 mg/dL (ref 70–99)
Potassium: 3.6 mmol/L (ref 3.5–5.1)
Sodium: 136 mmol/L (ref 135–145)

## 2019-04-23 LAB — CULTURE, BODY FLUID W GRAM STAIN -BOTTLE
Culture: NO GROWTH
Special Requests: ADEQUATE

## 2019-04-23 LAB — CBC WITH DIFFERENTIAL/PLATELET
Abs Immature Granulocytes: 0.15 10*3/uL — ABNORMAL HIGH (ref 0.00–0.07)
Basophils Absolute: 0.1 10*3/uL (ref 0.0–0.1)
Basophils Relative: 0 %
Eosinophils Absolute: 0 10*3/uL (ref 0.0–0.5)
Eosinophils Relative: 0 %
HCT: 28.6 % — ABNORMAL LOW (ref 39.0–52.0)
Hemoglobin: 9.2 g/dL — ABNORMAL LOW (ref 13.0–17.0)
Immature Granulocytes: 1 %
Lymphocytes Relative: 7 %
Lymphs Abs: 1.4 10*3/uL (ref 0.7–4.0)
MCH: 26.9 pg (ref 26.0–34.0)
MCHC: 32.2 g/dL (ref 30.0–36.0)
MCV: 83.6 fL (ref 80.0–100.0)
Monocytes Absolute: 1.6 10*3/uL — ABNORMAL HIGH (ref 0.1–1.0)
Monocytes Relative: 8 %
Neutro Abs: 15.8 10*3/uL — ABNORMAL HIGH (ref 1.7–7.7)
Neutrophils Relative %: 84 %
Platelets: 254 10*3/uL (ref 150–400)
RBC: 3.42 MIL/uL — ABNORMAL LOW (ref 4.22–5.81)
RDW: 20.1 % — ABNORMAL HIGH (ref 11.5–15.5)
WBC: 19 10*3/uL — ABNORMAL HIGH (ref 4.0–10.5)
nRBC: 0 % (ref 0.0–0.2)

## 2019-04-23 LAB — PATHOLOGIST SMEAR REVIEW

## 2019-04-23 MED ORDER — SODIUM CHLORIDE 0.9 % IV SOLN
INTRAVENOUS | Status: DC
  Administered 2019-04-23 – 2019-04-26 (×2): via INTRAVENOUS

## 2019-04-23 NOTE — Progress Notes (Signed)
Hospitalist progress note  Tyler [name]  PTW:[id number] DOB: 1961-02-23 DOA: 04/19/2019 PCP: Brand Males, MD  Narrative:  15 black male stage III lung cancer diagnosed 01/2019 on carboplatin paclitaxel 5 cycles given last office visit 03/05/2019 Dr. Pamala Hurry cGy in 30 fractions XRT, recent admission 9/25 acute PE + bilateral DVT/Lovenox prior EtOH, tobacco quit smoking several months ago again Came to ED early a.m. 10/8 2020 with some chest pain malaise and right chest since chest x-ray did not show pneumothorax or effusion he was in mild distress he was discharged home with low-dose Percocet-return to ED later on T-max 103.5-CT scan concerning for possible pneumonia white count normal however Saline resuscitated 1.5 L cefepime started later during hospital stay paroxysmal A. fib  Developed rapid infusion which was tapped 10/9 IR to eval for Pleurx-Not candidate per CVTS Dr. Roxy Manns for vats or pleurodesis   Assessment & Plan: Sepsis 2/2 aspiration/postobstructive pneumonia-continue Unasyn -await further word from IR regarding planning as below  Acute hypoxic respiratory failure-CXR 2 VW 10/9 = exudative effusion-thoracentesis 1.2 L performed 10/9-continue antibiotics-repeat chest x-rays 10/10 show accumulation rather rapidly -given multiple loculations, d/w Dr. Ricard Dillon thoracic surgery with regards to management-IR will see and eval for possible Ct or US guided consideration of drain--defer Indwelling catheter to their discretion once seen  Stage III lung adenocarcinoma versus stage IV status post XRT-added to Rx team Dr. Colon Branch on carboplatin paclitaxel on hold-  severe cancer related cachexia with BMI of 16  pain control -first choice ibuprofen 400, second choice Percocet 5/325 1-2---d/c morphine given somnolence and good control on perccoet  Recent acute pulmonary embolism with DVTs CT scan chest not showing any further embolism continue Lovenox likely lifelong further outpatient  discussion oncologist  Sinus tachycardia?  Paroxysmal A. fib possible on monitors however this seems to have resolved on 10/9 LVH per criterion Continue full dose Lovenox for prior pulmonary embolism DVT which should cover the same  Reflux continue Carafate 1 g 3 times daily  COPD continue albuterol 2 puffs every 6 as needed tiotropium in addition   DVT prophylaxis: Full dose Lovenox full code  Code Status:   Full code   Family Communication:   None present Disposition Plan: await IR input regarding further options to management-he is not ready for discharge  Consultants:  added Dr. [name] to treatment team   Cefepime on admission and then was changed to Unasyn Antimicrobials:    Subjective: Pain improced-slightly sleepy no - Fever - chills - nausea - vomiting-oxygen on 4 liters now States good control with oral percocet  Objective: Vitals:   04/22/19 0925 04/22/19 1336 04/22/19 2135 04/23/19 0556  BP: 110/70 122/77 138/80 116/83  Pulse: (!) 104 (!) 104 (!) 108 78  Resp: 18 20 19 20   Temp: 98.8 F (37.1 C) 99.3 F (37.4 C) 98.7 F (37.1 C) 97.7 F (36.5 C)  TempSrc: Oral Oral Oral Oral  SpO2: (!) 82% (!) 86% 91% 92%  Weight:      Height:        Intake/Output Summary (Last 24 hours) at 04/23/2019 1153 Last data filed at 04/23/2019 0830 Gross per 24 hour  Intake 360 ml  Output 700 ml  Net -340 ml   Filed Weights   04/19/19 2321  Weight: 59 kg    Cachectic No increased work of breathing EOMI NCAT Moderate dentition Decreased resonance fremitus right posterior lung fields Abdomen soft no rebound Burn marks over front and back of chest  neurologically intact moving all  4 limbs without deficit  Data Reviewed: I have personally reviewed following labs and imaging studies CBC: Recent Labs  Lab 04/19/19 0424 04/19/19 1242 04/20/19 0522 04/21/19 0636 04/22/19 0552 04/23/19 0530  WBC 10.6* 10.4 14.6* 17.0* 17.4* 19.0*  NEUTROABS 7.9*  --   --   15.1* 14.9* 15.8*  HGB 7.2* 7.8* 7.7* 8.0* 8.7* 9.2*  HCT 22.5* 24.6* 23.5* 25.5* 27.5* 28.6*  MCV 86.2 86.9 86.1 88.5 86.5 83.6  PLT 215 225 190 232 258 827   Basic Metabolic Panel: Recent Labs  Lab 04/19/19 1242 04/20/19 0522 04/21/19 0636 04/22/19 0552 04/23/19 0530  NA 132* 133* 135 135 136  K 4.0 4.2 3.5 3.5 3.6  CL 96* 101 98 97* 97*  CO2 25 24 27 27 26   GLUCOSE 156* 95 123* 94 90  BUN 14 12 11 11 11   CREATININE 0.47* 0.38* 0.46* 0.54* 0.47*  CALCIUM 8.2* 7.7* 7.9* 7.9* 8.2*   GFR: Estimated Creatinine Clearance: 81.9 mL/min (A) (by C-G formula based on SCr of 0.47 mg/dL (L)). Liver Function Tests: Recent Labs  Lab 04/19/19 1242 04/20/19 0522 04/21/19 0636  AST 17 20 16   ALT 16 12 12   ALKPHOS 82 52 68  BILITOT 0.8 0.9 0.5  PROT 6.9 5.6* 5.7*  ALBUMIN 2.3* 1.9* 1.8*   No results for input(s): LIPASE, AMYLASE in the last 168 hours. No results for input(s): AMMONIA in the last 168 hours. Coagulation Profile: Recent Labs  Lab 04/19/19 1242  INR 1.2   Cardiac Enzymes: Radiology Studies: Reviewed images personally in health database  Scheduled Meds: . enoxaparin  90 mg Subcutaneous Q24H  . feeding supplement  1 Container Oral BID BM  . magnesium oxide  400 mg Oral BID  . multivitamin with minerals  1 tablet Oral Daily  . potassium chloride SA  20 mEq Oral Daily  . sucralfate  1 g Oral TID WC & HS  . umeclidinium bromide  1 puff Inhalation Daily   Continuous Infusions: . ampicillin-sulbactam (UNASYN) IV 3 g (04/23/19 0786)    LOS: 4 days   Time spent: [place], MD [facility]  If 7PM-7AM, please contact night-coverage-look on AMION to find my number otherwise-prefer pages-not epic chat,please 04/23/2019, 11:53 AM

## 2019-04-24 ENCOUNTER — Inpatient Hospital Stay (HOSPITAL_COMMUNITY): Payer: Medicaid Other

## 2019-04-24 DIAGNOSIS — I2699 Other pulmonary embolism without acute cor pulmonale: Secondary | ICD-10-CM

## 2019-04-24 DIAGNOSIS — J90 Pleural effusion, not elsewhere classified: Secondary | ICD-10-CM

## 2019-04-24 LAB — CULTURE, BLOOD (ROUTINE X 2): Culture: NO GROWTH

## 2019-04-24 MED ORDER — ORAL CARE MOUTH RINSE
15.0000 mL | Freq: Two times a day (BID) | OROMUCOSAL | Status: DC
  Administered 2019-04-24 – 2019-05-01 (×9): 15 mL via OROMUCOSAL

## 2019-04-24 MED ORDER — ENOXAPARIN SODIUM 100 MG/ML ~~LOC~~ SOLN: 90.0000 mg | SUBCUTANEOUS | Status: DC

## 2019-04-24 MED ORDER — POLYETHYLENE GLYCOL 3350 17 G PO PACK
17.0000 g | PACK | Freq: Every day | ORAL | Status: DC
  Administered 2019-04-24 – 2019-05-01 (×5): 17 g via ORAL
  Filled 2019-04-24 (×7): qty 1

## 2019-04-24 NOTE — Progress Notes (Signed)
Hospitalist progress note  Tyler [name]  ASN:[id number] DOB: 12-23-1960 DOA: 04/19/2019 PCP: Brand Males, MD  Narrative:  53 black male stage III lung cancer diagnosed 01/2019 on carboplatin paclitaxel 5 cycles given last office visit 03/05/2019 Dr. Pamala Hurry cGy in 30 fractions XRT, recent admission 9/25 acute PE + bilateral DVT/Lovenox prior EtOH, tobacco quit smoking several months ago again Came to ED early a.m. 10/8 2020 with some chest pain malaise and right chest since chest x-ray did not show pneumothorax or effusion he was in mild distress he was discharged home with low-dose Percocet-return to ED later on T-max 103.5-CT scan concerning for possible pneumonia white count normal however Saline resuscitated 1.5 L cefepime started later during hospital stay paroxysmal A. fib  Developed rapid infusion which was tapped 10/9 IR to eval for Pleurx-Not candidate per CVTS Dr. Roxy Manns for vats or pleurodesis   Assessment & Plan: Sepsis 2/2 aspiration/postobstructive pneumonia-continue Unasyn -await further word from IR regarding planning as below  Acute hypoxic respiratory failure-CXR 2 VW 10/9 = exudative effusion-thoracentesis 1.2 L performed 10/9-continue antibiotics-repeat chest x-rays 10/10 show accumulation rather rapidly -given multiple loculations previous discussion with Dr. Ricard Dillon of thoracic surgery who did not feel he was a great candidate for VATS-discussed with IR multiple times options and it was felt after discussion with pulmonary Dr. Elsworth Soho and Mr. Kary Kos that the best way forward is to have an IR guided tube + TPA and DNase to the area and see if this clears it up--IR has been in discussion with pulmonary and tentatively is planned for 10/14  Stage III lung adenocarcinoma versus stage IV status post XRT-added to Rx team Dr. Colon Branch on carboplatin paclitaxel on hold-  severe cancer related cachexia with BMI of 16  pain control -first choice ibuprofen 400, second choice  Percocet 5/325 1-2---d/c morphine given somnolence and good control on perccoet  Recent acute pulmonary embolism with DVTs CT scan chest not showing any further embolism continue Lovenox likely lifelong further outpatient discussion oncologist-Lovenox on hold until tomorrow after procedure  Sinus tachycardia?  Paroxysmal A. fib possible on monitors however this seems to have resolved on 10/9 LVH per criterion Co Lovenox to be resumed post procedure  Reflux continue Carafate 1 g 3 times daily  COPD continue albuterol 2 puffs every 6 as needed tiotropium in addition   DVT prophylaxis: Full dose Lovenox full code  Code Status:   Full code   Family Communication:   None present Disposition Plan: await IR input regarding further options to management-he is not ready for discharge  Consultants:  added Dr. [name] to treatment team   Cefepime on admission and then was changed to Unasyn Antimicrobials:    Subjective: Awake alert coherent no distress Overall feels better and is ready for procedure after understanding a little bit more clearly plans No chest pain no fever Objective: Vitals:   04/23/19 1455 04/23/19 1944 04/24/19 0551 04/24/19 1407  BP: 119/80 103/66 118/78 108/74  Pulse: (!) 105 (!) 47 96 83  Resp: 19 16  18   Temp: 98.2 F (36.8 C) 98.7 F (37.1 C) 98.6 F (37 C) 98.3 F (36.8 C)  TempSrc: Oral Oral Oral Oral  SpO2: 90% 93% 90% 97%  Weight:      Height:        Intake/Output Summary (Last 24 hours) at 04/24/2019 1452 Last data filed at 04/24/2019 0639 Gross per 24 hour  Intake 900.13 ml  Output 375 ml  Net 525.13 ml   [employer]  04/19/19 2321  Weight: 59 kg    Awake coherent on 4 L oxygen close to baseline EOMI NCAT Moderate dentition Decreased resonance fremitus right posterior lung fields three-quarter way up lungs Abdomen soft no rebound Burn marks over front and back of chest  neurologically intact moving all 4 limbs without  deficit  Data Reviewed: I have personally reviewed following labs and imaging studies CBC: Recent Labs  Lab 04/19/19 0424 04/19/19 1242 04/20/19 0522 04/21/19 0636 04/22/19 0552 04/23/19 0530  WBC 10.6* 10.4 14.6* 17.0* 17.4* 19.0*  NEUTROABS 7.9*  --   --  15.1* 14.9* 15.8*  HGB 7.2* 7.8* 7.7* 8.0* 8.7* 9.2*  HCT 22.5* 24.6* 23.5* 25.5* 27.5* 28.6*  MCV 86.2 86.9 86.1 88.5 86.5 83.6  PLT 215 225 190 232 258 892   Basic Metabolic Panel: Recent Labs  Lab 04/19/19 1242 04/20/19 0522 04/21/19 0636 04/22/19 0552 04/23/19 0530  NA 132* 133* 135 135 136  K 4.0 4.2 3.5 3.5 3.6  CL 96* 101 98 97* 97*  CO2 25 24 27 27 26   GLUCOSE 156* 95 123* 94 90  BUN 14 12 11 11 11   CREATININE 0.47* 0.38* 0.46* 0.54* 0.47*  CALCIUM 8.2* 7.7* 7.9* 7.9* 8.2*   GFR: Estimated Creatinine Clearance: 81.9 mL/min (A) (by C-G formula based on SCr of 0.47 mg/dL (L)). Liver Function Tests: Recent Labs  Lab 04/19/19 1242 04/20/19 0522 04/21/19 0636  AST 17 20 16   ALT 16 12 12   ALKPHOS 82 52 68  BILITOT 0.8 0.9 0.5  PROT 6.9 5.6* 5.7*  ALBUMIN 2.3* 1.9* 1.8*   No results for input(s): LIPASE, AMYLASE in the last 168 hours. No results for input(s): AMMONIA in the last 168 hours. Coagulation Profile: Recent Labs  Lab 04/19/19 1242  INR 1.2   Cardiac Enzymes: Radiology Studies: Reviewed images personally in health database  Scheduled Meds: . enoxaparin  90 mg Subcutaneous Q24H  . feeding supplement  1 Container Oral BID BM  . magnesium oxide  400 mg Oral BID  . mouth rinse  15 mL Mouth Rinse BID  . multivitamin with minerals  1 tablet Oral Daily  . polyethylene glycol  17 g Oral Daily  . potassium chloride SA  20 mEq Oral Daily  . sucralfate  1 g Oral TID WC & HS  . umeclidinium bromide  1 puff Inhalation Daily   Continuous Infusions: . sodium chloride 10 mL/hr at 04/23/19 2355  . ampicillin-sulbactam (UNASYN) IV 3 g (04/24/19 1204)    LOS: 5 days   Time spent: [place], MD [facility]  If 7PM-7AM, please contact night-coverage-look on AMION to find my number otherwise-prefer pages-not epic chat,please 04/24/2019, 2:52 PM

## 2019-04-24 NOTE — Consult Note (Signed)
Chief Complaint: Patient was seen in consultation today for right chest tube placement.  Referring Physician(s): Dr. Elsworth Soho  Supervising Physician: Markus Daft  Patient Status: [facility] - In-pt  History of Present Illness: Tyler [name] is a 58 y.o. male with a past medical history significant for ETOH abuse, tobacco abuse, chronic back pain, recent acute PE/DVT currently on Lovenox and stage III lung cancer s/p chemoradiation followed by Dr. Julien Nordmann who was admitted on 10/8 with worsening dyspnea and fever. He was found to have a worsening moderate right loculated pleural effusion as well as new obstruction of the right middle lobe and lower lobe bronchi likely due to aspiration or mucous plugging. He underwent thoracentesis in IR on 10/9 which yielded 1.2 L of pleural fluid - fluid studies showed this to be a exudative effusion, negative culture and no cytology was obtained. He had previously undergone thoracentesis in IR for a right sided effusion on 9/26 which yielded 460 mL of pleural fluid - fluid studies showed this to be a transudative effusion, negative culture and cytology showing acute inflammation/reactive mesothelial cells. CT surgery was consulted during this admission and felt that he was not a candidate for thoracotomy. IR was consulted for possible Pleurx placement which was reviewed by Dr. Anselm Pancoast who felt that a Pleurx would not be appropriate due to the highly loculated effusion. It was discussed with primary team that IR could place a chest tube for tPA/pulmozyne if pulmonology felt this would be appropriate - patient was seen by pulmonology who deemed patient an appropriate candidate for large bore chest tube placement in IR for possible tPA/pulmozyne administration by their service.  Patient seen sleeping in bed, arouses to loud voice cues. He states that he is "tired of talking about this" and that he has "had this explained 3 time already and I told all of them I want to do it so I  don't want to talk about it anymore." He did not wish to provide any further history to me today, however he did allow me to review the procedure and the risks with him before signing consent.   Past Medical History:  Diagnosis Date   Alcohol abuse    Chronic back pain    COPD (chronic obstructive pulmonary disease) (HCC)    Dyspnea    Lung mass    mediastinal adenopathy   Lung nodule    Multiple pulmonary nodules 04/02/2018   Pneumonia    Right lower lobe pulmonary nodule 04/02/2018   12/01/2017: PET SUV 2.1   Tobacco abuse     Past Surgical History:  Procedure Laterality Date   APPENDECTOMY     VIDEO BRONCHOSCOPY WITH ENDOBRONCHIAL NAVIGATION N/A 04/05/2018   Procedure: VIDEO BRONCHOSCOPY WITH ENDOBRONCHIAL NAVIGATION;  Surgeon: Garner Nash, DO;  Location: [facility];  Service: Thoracic;  Laterality: N/A;   VIDEO BRONCHOSCOPY WITH ENDOBRONCHIAL NAVIGATION N/A 01/11/2019   Procedure: VIDEO BRONCHOSCOPY WITH ENDOBRONCHIAL NAVIGATION;  Surgeon: Garner Nash, DO;  Location: [place];  Service: Thoracic;  Laterality: N/A;   VIDEO BRONCHOSCOPY WITH ENDOBRONCHIAL ULTRASOUND N/A 01/11/2019   Procedure: VIDEO BRONCHOSCOPY WITH ENDOBRONCHIAL ULTRASOUND;  Surgeon: Garner Nash, DO;  Location: [place];  Service: Thoracic;  Laterality: N/A;   VIDEO BRONCHOSCOPY WITH RADIAL ENDOBRONCHIAL ULTRASOUND N/A 01/11/2019   Procedure: VIDEO BRONCHOSCOPY WITH RADIAL ENDOBRONCHIAL ULTRASOUND;  Surgeon: Garner Nash, DO;  Location: [facility];  Service: Thoracic;  Laterality: N/A;   WISDOM TOOTH EXTRACTION      Allergies: Patient has  no known allergies.  Medications: Prior to Admission medications   Medication Sig Start Date End Date Taking? Authorizing Provider  albuterol (PROVENTIL HFA;VENTOLIN HFA) 108 (90 Base) MCG/ACT inhaler Inhale 2 puffs into the lungs every 6 (six) hours as needed for wheezing or shortness of breath. 06/27/18  Yes Icard, Bradley L, DO  enoxaparin (LOVENOX) 100 MG/ML  injection Inject 0.9 mLs (90 mg total) into the skin daily. 04/11/19 05/11/19 Yes GhimireDante Gang, MD  feeding supplement, ENSURE ENLIVE, (ENSURE ENLIVE) LIQD Take 237 mLs by mouth 3 (three) times daily between meals. Patient taking differently: Take 237 mLs by mouth daily.  08/11/17  Yes Ghimire, Henreitta Leber, MD  ibuprofen (ADVIL) 200 MG tablet Take 400 mg by mouth every 6 (six) hours as needed for fever, headache or moderate pain.   Yes [provider]  Magnesium Oxide 400 MG CAPS Take 1 capsule (400 mg total) by mouth 2 (two) times daily. 04/10/19 05/10/19 Yes Ghimire, Dante Gang, MD  potassium chloride SA (K-DUR) 20 MEQ tablet Take 1 tablet (20 mEq total) by mouth daily. 04/06/19  Yes Tish Men, MD  sucralfate (CARAFATE) 1 g tablet Take 1 g by mouth 4 (four) times daily -  with meals and at bedtime.   Yes [provider]  tiotropium (SPIRIVA HANDIHALER) 18 MCG inhalation capsule Place 1 capsule (18 mcg total) into inhaler and inhale daily. 06/27/18 01/05/20 Yes Icard, Octavio Graves, DO  lidocaine (XYLOCAINE) 2 % solution 5 ml swallow before meals for radiation esophagitis Patient taking differently: 5 ml swallow before meals for radiation esophagitis as needed to numb 03/30/19   [name], MD  oxyCODONE-acetaminophen (PERCOCET) 5-325 MG tablet Take 1-2 tablets by mouth every 4 (four) hours as needed for moderate pain or severe pain. 04/19/19   Orpah Greek, MD     Family History  Problem Relation Age of Onset   Heart attack Maternal Grandfather     Social History   Socioeconomic History   Marital status: Married    Spouse name: Not on file   Number of children: Not on file   Years of education: Not on file   Highest education level: Not on file  Occupational History   Not on file  Social Needs   Financial resource strain: Not on file   Food insecurity    Worry: Not on file    Inability: Not on file   Transportation needs    Medical: Yes    Non-medical:  Yes  Tobacco Use   Smoking status: Current Every Day Smoker    Packs/day: 0.25    Years: 40.00    Pack years: 10.00    Types: Cigarettes   Smokeless tobacco: Never Used  Substance and Sexual Activity   Alcohol use: Not Currently   Drug use: Not Currently    Types: Marijuana   Sexual activity: Not on file  Lifestyle   Physical activity    Days per week: Not on file    Minutes per session: Not on file   Stress: Not on file  Relationships   Social connections    Talks on phone: Not on file    Gets together: Not on file    Attends religious service: Not on file    Active member of club or organization: Not on file    Attends meetings of clubs or organizations: Not on file    Relationship status: Not on file  Other Topics Concern   Not on file  Social History Narrative  Not on file     Review of Systems: A 12 point ROS discussed and pertinent positives are indicated in the HPI above.  All other systems are negative.  Review of Systems  Unable to perform ROS: Other  Patient declined to participate in ROS stating he had already answered these questions today and did not want to repeat himself.  Vital Signs: BP 108/74 (BP Location: Left Arm)    Pulse 83    Temp 98.3 F (36.8 C) (Oral)    Resp 18    Ht 6' 2.02" (1.88 m)    Wt 130 lb (59 kg)    SpO2 97%    BMI 16.68 kg/m   Physical Exam Vitals signs and nursing note reviewed.  Constitutional:      Appearance: He is ill-appearing.     Comments: Cachectic  HENT:     Head: Normocephalic.     Mouth/Throat:     Mouth: Mucous membranes are moist.     Pharynx: Oropharynx is clear. No oropharyngeal exudate or posterior oropharyngeal erythema.  Cardiovascular:     Rate and Rhythm: Normal rate and regular rhythm.  Pulmonary:     Effort: Pulmonary effort is normal.     Comments: Decreased breath sound bilaterally; poor inspiratory effort on exam. Patient minimally compliant with exam.  Abdominal:     General: There  is no distension.     Palpations: Abdomen is soft.     Tenderness: There is no abdominal tenderness.  Skin:    General: Skin is warm and dry.  Neurological:     Mental Status: He is alert and oriented to person, place, and time.  Psychiatric:        Mood and Affect: Mood normal.        Behavior: Behavior normal.        Thought Content: Thought content normal.        Judgment: Judgment normal.      MD Evaluation Airway: WNL Heart: WNL Abdomen: WNL Chest/ Lungs: WNL ASA  Classification: 3 Mallampati/Airway Score: Two   Imaging: Dg Chest 1 View  Result Date: 04/20/2019 CLINICAL DATA:  Post right thoracentesis EXAM: CHEST  1 VIEW COMPARISON:  04/20/2019 FINDINGS: Right pleural effusion markedly improved. Small residual right effusion. Improvement in right lower lobe consolidation. No pneumothorax Diffuse bilateral airspace disease unchanged. Possible pneumonia or edema. No left effusion. IMPRESSION: No pneumothorax post right thoracentesis. Partial re-expansion right lower lobe Diffuse bilateral airspace disease unchanged compatible with edema or pneumonia. Electronically Signed   By: Franchot Gallo M.D.   On: 04/20/2019 15:49   Dg Chest 2 View  Result Date: 04/21/2019 CLINICAL DATA:  Shortness of breath. EXAM: CHEST - 2 VIEW COMPARISON:  April 20, 2019. FINDINGS: Stable cardiomediastinal silhouette. No pneumothorax is noted. Moderate right pleural effusion is noted which may be loculated. Right basilar atelectasis or infiltrate is noted. Stable left perihilar and basilar reticular opacity is noted which may represent edema or possibly scarring. Bony thorax unremarkable. IMPRESSION: Increased right pleural effusion is noted with associated right basilar atelectasis or infiltrate. Stable left midlung and basilar opacity as described above. Electronically Signed   By: Marijo Conception M.D.   On: 04/21/2019 11:32   Dg Chest 2 View  Result Date: 04/20/2019 CLINICAL DATA:  Shortness of  breath with productive cough. History of lung cancer. EXAM: CHEST - 2 VIEW COMPARISON:  Radiographs and CT 04/19/2019. FINDINGS: Lateral view was repeated. Right pleural effusion has significantly reaccumulated compared with the  prior studies. This is partially loculated inferolaterally. There are worsening airspace opacities at both lung bases which could reflect edema or inflammation. No pneumothorax or significant left pleural effusion. The visualized heart size and mediastinal contours are stable. No acute osseous findings. IMPRESSION: 1. Enlarging right pleural effusion with worsening airspace opacities at both lung bases which could reflect edema or inflammation. 2. No other significant changes identified compared with prior studies. Electronically Signed   By: Richardean Sale M.D.   On: 04/20/2019 11:24   Dg Chest 2 View  Result Date: 04/06/2019 CLINICAL DATA:  Chest pain. EXAM: CHEST - 2 VIEW COMPARISON:  Radiograph of January 11, 2019. FINDINGS: The heart size and mediastinal contours are within normal limits. No pneumothorax is noted. Left lung is clear. Probable lower lobe atelectasis or pneumonia is noted with small right pleural effusion. The visualized skeletal structures are unremarkable. IMPRESSION: Right lower lobe opacity is noted most consistent with atelectasis or infiltrate with associated pleural effusion. Followup PA and lateral chest X-ray is recommended in 3-4 weeks following trial of antibiotic therapy to ensure resolution and exclude underlying malignancy. Electronically Signed   By: Marijo Conception M.D.   On: 04/06/2019 20:19   Ct Head Wo Contrast  Result Date: 04/19/2019 CLINICAL DATA:  Altered mental status EXAM: CT HEAD WITHOUT CONTRAST TECHNIQUE: Contiguous axial images were obtained from the base of the skull through the vertex without intravenous contrast. COMPARISON:  MR brain, 01/18/2019 FINDINGS: Brain: No evidence of acute infarction, hemorrhage, hydrocephalus, extra-axial  collection or mass lesion/mass effect. The ventricular white matter hypodensity. Vascular: No hyperdense vessel or unexpected calcification. Skull: Normal. Negative for fracture or focal lesion. Sinuses/Orbits: No acute finding. Other: None. IMPRESSION: No acute intracranial pathology.  Small-vessel white matter disease. Electronically Signed   By: Eddie Candle M.D.   On: 04/19/2019 15:02   Ct Chest W Contrast  Addendum Date: 04/06/2019   ADDENDUM REPORT: 04/06/2019 16:44 ADDENDUM: Critical Value/emergent results were called by telephone at the time of interpretation on 04/06/2019 at 4:43 pm to the [facility] triage nurse Elmer Picker, who verbally acknowledged these results. Electronically Signed   By: Kerby Moors M.D.   On: 04/06/2019 16:44   Result Date: 04/06/2019 CLINICAL DATA:  Restaging right lung cancer. Right supraclavicular pain and cough. EXAM: CT CHEST WITH CONTRAST TECHNIQUE: Multidetector CT imaging of the chest was performed during intravenous contrast administration. CONTRAST:  71mL OMNIPAQUE IOHEXOL 300 MG/ML  SOLN COMPARISON:  CT chest 01/08/2019 and PET-CT 12/27/2018 FINDINGS: Cardiovascular: Normal heart size. No pericardial effusion. Mild aortic atherosclerosis. RCA and left circumflex coronary artery calcifications. There is a left upper lobe segmental branch pulmonary artery filling defect, image 76/2. This is compatible with acute pulmonary embolus. Additional PE identified within a segmental branch of the left lower lobe pulmonary artery, image 101/2 Mediastinum/Nodes: Normal appearance of the thyroid gland. The trachea appears patent and is midline. Normal appearance of the esophagus. No mediastinal or left hilar adenopathy. 1 cm right hilar lymph node is identified. Increased from 0.7 cm previously. Lungs/Pleura: Centrilobular and paraseptal emphysema. New moderate right pleural effusion and small left pleural effusion. Significant interval increase in  tumor burden within the right lower lobe which measures 8.6 x 6.7 by 6.4 cm, image 112/2 and image 52/7. On the sagittal images this has a AP diameter 9.7 cm spanning the right lower lobe and right middle lobe. Adjacent ground-glass attenuation and interlobular septal thickening noted concerning for lymphangitic spread. On the previous exam  this lesion was limited to the central right lower lobe measuring 1.9 x 2.5 by 1.5 cm. Tumor extension into the right infrahilar region with encasement of the right lower lobe and right middle lobe bronchi identified with postobstructive pneumonitis. New tumor within the central right middle lobe measures 4.4 x 2.5 by 5.4 cm, image 117/2 and image 25/6. Solid nodule within the right upper lobe measures 8 mm, image 31/5. Unchanged from previous exam. Thin walled cavitary nodule within the posterior left upper lobe measures 4 mm, image 43/5. Previously this measured 6 mm. Unchanged, ill-defined part solid nodule in the lateral aspect of the superior segment of left lower lobe measuring 1 cm, image 78/5. Triangular-shaped, subpleural nodule in the posterior left lower lobe is new measuring 1.4 cm and may represent an area of pulmonary infarct is this is in the distribution of the left lower lobe pulmonary embolus. Upper Abdomen: No acute abnormality. Musculoskeletal: No acute or aggressive bone lesions identified. IMPRESSION: 1. Considerable increase in tumor burden involving the left lower lobe and now the right middle lobe. There is overlying ground-glass attenuation and interlobular septal thickening concerning for lymphangitic spread of disease. 2. New bilateral pleural effusions right greater than left. 3. Additional small solid and sub solid nodules involving the right upper lobe and left lung are not significantly changed in the interval. 4. Left upper lobe and left lower lobe segmental branch acute pulmonary artery emboli. Electronically Signed: By: Kerby Moors M.D. On:  04/06/2019 15:47   Ct Angio Chest Pe W Or Wo Contrast  Result Date: 04/19/2019 CLINICAL DATA:  Chest pain and respiratory distress. History of lung cancer. Discharge home was a long emergency room today. Diagnosed with pulmonary embolism last week. EXAM: CT ANGIOGRAPHY CHEST WITH CONTRAST TECHNIQUE: Multidetector CT imaging of the chest was performed using the standard protocol during bolus administration of intravenous contrast. Multiplanar CT image reconstructions and MIPs were obtained to evaluate the vascular anatomy. CONTRAST:  1107mL OMNIPAQUE IOHEXOL 350 MG/ML SOLN COMPARISON:  04/06/2019 and 12/11/2018 FINDINGS: Cardiovascular: Mild stable cardiomegaly. Thoracic aorta is normal in caliber. Pulmonary arterial system is well opacified as the previously seen small proximal left-sided pulmonary emboli are no longer visualized. No current pulmonary emboli are visualized bilaterally. Remaining vascular structures are unremarkable. Mediastinum/Nodes: No definite adenopathy visualized. Remaining mediastinal structures are unremarkable. Lungs/Pleura: Lungs are adequately inflated demonstrate worsening of a moderate size right pleural effusion. Moderate consolidation over the right lower lobe slightly worse likely combination of atelectasis as well as patient's known right lung cancer. This is difficult to measure due to the adjacent consolidation and effusion. Stable mass over the anteromedial right midlung. Several small stable nodule opacities over the right upper lobe. Patchy mixed interstitial airspace density over the anterior right midlung. Worsening patchy airspace density over the superior segment of the left lower lobe which may be due to infection. Remainder of the left lung is unchanged. Biapical pleural thickening. Obstruction of several proximal right middle lobe and lower lobe bronchi which is a new finding and most likely due to aspiration or mucous plugging. Narrowing of the posterior right  pulmonary vein in the region of patient's medial right infrahilar lung cancer. Mild emphysematous disease. Suggestion of aspirate material within the right-sided the distal trachea. Upper Abdomen: No acute findings. Musculoskeletal: No change. Review of the MIP images confirms the above findings. IMPRESSION: 1. Stable changes of patient's known right lower lobe lung cancer which is difficult to define due to the adjacent consolidation and effusion. Stable  masslike consolidation over the medial aspect of the anterior right midlung abutting the pericardium. Stable right upper lobe nodules suggesting metastatic disease. Worsening moderate size right pleural effusion with slight worsening consolidation in the right base. New obstruction of right middle lobe and lower lobe bronchi likely due to aspiration or mucous plugging. Narrowing of right posterior pulmonary vein. 2. Mild worsening patchy airspace process over the superior segment left lower lobe as well as anterior right midlung which may be due to infection. 3. Previously seen small left-sided pulmonary emboli no longer visualized. No current pulmonary emboli identified. 4.  Mild emphysematous disease.  Mild cardiomegaly. Electronically Signed   By: Marin Olp M.D.   On: 04/19/2019 15:25   Dg Chest Port 1 View  Result Date: 04/19/2019 CLINICAL DATA:  Right chest wall pain.  History of lung cancer EXAM: PORTABLE CHEST 1 VIEW COMPARISON:  04/07/2019 FINDINGS: Right base airspace disease and small pleural effusion with increased pleural fluid from comparison chest x-ray. No pneumothorax. Normal heart size. IMPRESSION: 1. Small right pleural effusion with reaccumulation since 04/07/2019. 2. Right base consolidation/mass, reference 04/06/2019 CT. Electronically Signed   By: Monte Fantasia M.D.   On: 04/19/2019 04:07   Dg Chest Port 1 View  Result Date: 04/07/2019 CLINICAL DATA:  Status post right thoracentesis. EXAM: PORTABLE CHEST 1 VIEW COMPARISON:  Film  earlier today at 0800 hours FINDINGS: In the frontal projection there appears to be complete evacuation of right pleural fluid. Underlying atelectasis/consolidation at the right lung base remains. No pneumothorax or pulmonary edema. Stable cardiac enlargement. IMPRESSION: No pneumothorax after right thoracentesis with complete evacuation of right pleural fluid. Underlying atelectasis and consolidation remains at the right lung base. Electronically Signed   By: Aletta Edouard M.D.   On: 04/07/2019 13:59   Vas [place] Lower Extremity Venous (dvt)  Result Date: 04/08/2019  Lower Venous Study Indications: Pulmonary embolism.  Risk Factors: Confirmed PE Cancer [place] - Lung. Comparison Study: No previous study available Performing Technologist: Toma Copier RVS  Examination Guidelines: A complete evaluation includes B-mode imaging, spectral Doppler, color Doppler, and power Doppler as needed of all accessible portions of each vessel. Bilateral testing is considered an integral part of a complete examination. Limited examinations for reoccurring indications may be performed as noted.  +---------+---------------+---------+-----------+----------+-------------------+  RIGHT     Compressibility Phasicity Spontaneity Properties Thrombus Aging       +---------+---------------+---------+-----------+----------+-------------------+  CFV       Full            Yes       Yes                                         +---------+---------------+---------+-----------+----------+-------------------+  SFJ       Full                                                                  +---------+---------------+---------+-----------+----------+-------------------+  FV Prox   Full            Yes       Yes                                         +---------+---------------+---------+-----------+----------+-------------------+  FV Mid    Full                                                                   +---------+---------------+---------+-----------+----------+-------------------+  FV Distal Full            Yes       Yes                                         +---------+---------------+---------+-----------+----------+-------------------+  PFV       Full            Yes       Yes                                         +---------+---------------+---------+-----------+----------+-------------------+  POP       Partial         Yes       Yes                    Acute -Phasic in                                                                 the proximal                                                                     region.              +---------+---------------+---------+-----------+----------+-------------------+  PTV       Partial                                          Acute mid to                                                                     proximal             +---------+---------------+---------+-----------+----------+-------------------+  PERO                                                       Not visualized       +---------+---------------+---------+-----------+----------+-------------------+  Right Technical Findings: Not visualized segments include Peroneal. Unable to visualize the peroneal vein. Mild technical difficulty throughout due to acoustic shadowing from calcification of the arteries  +---------+---------------+---------+-----------+----------+------------------+  LEFT      Compressibility Phasicity Spontaneity Properties Thrombus Aging      +---------+---------------+---------+-----------+----------+------------------+  CFV       Full            Yes       Yes                                        +---------+---------------+---------+-----------+----------+------------------+  SFJ       Full                                                                 +---------+---------------+---------+-----------+----------+------------------+  FV Prox   Full            Yes       Yes                                         +---------+---------------+---------+-----------+----------+------------------+  FV Mid    Full            Yes       Yes                                        +---------+---------------+---------+-----------+----------+------------------+  FV Distal Partial                                          Acute diatal to                                                                 mid                 +---------+---------------+---------+-----------+----------+------------------+  PFV       Full            Yes       Yes                                        +---------+---------------+---------+-----------+----------+------------------+  POP       Partial                                          Acute               +---------+---------------+---------+-----------+----------+------------------+  PTV       Partial  Acute - Mid to                                                                  proximal            +---------+---------------+---------+-----------+----------+------------------+  PERO      Full                                                                 +---------+---------------+---------+-----------+----------+------------------+   Left Technical Findings: Difficult to visualize the peroneal veins. Mld technical difficulty due to acoustic shadowing calification of the arteries.   Summary: Right: Findings consistent with acute deep vein thrombosis involving the right popliteal vein, and right posterior tibial veins. See technical findings listed above Left: Findings consistent with acute deep vein thrombosis involving the left posterior tibial veins, left popliteal vein, and left femoral vein. See technical findings listed above.  *See table(s) above for measurements and observations. Electronically signed by Curt Jews MD on 04/08/2019 at 9:47:50 AM.    Final    US Thoracentesis Asp Pleural Space W/img Guide  Result Date:  04/20/2019 INDICATION: Patient with history of lung cancer, dyspnea, recurrent right pleural effusion. Request made for diagnostic and therapeutic right thoracentesis. EXAM: ULTRASOUND GUIDED DIAGNOSTIC AND THERAPEUTIC RIGHT THORACENTESIS MEDICATIONS: None COMPLICATIONS: None immediate. PROCEDURE: An ultrasound guided thoracentesis was thoroughly discussed with the patient and questions answered. The benefits, risks, alternatives and complications were also discussed. The patient understands and wishes to proceed with the procedure. Written consent was obtained. Ultrasound was performed to localize and mark an adequate pocket of fluid in the right chest. The area was then prepped and draped in the normal sterile fashion. 1% Lidocaine was used for local anesthesia. Under ultrasound guidance a 6 Fr Safe-T-Centesis catheter was introduced. Thoracentesis was performed. The catheter was removed and a dressing applied. FINDINGS: A total of approximately 1.2 liters of hazy, yellow fluid was removed. Samples were sent to the laboratory as requested by the clinical team. The pleural fluid collection was multiloculated. IMPRESSION: Successful ultrasound guided diagnostic and therapeutic right thoracentesis yielding 1.2 liters of pleural fluid. Read by: Rowe Robert, PA-C Electronically Signed   By: Jacqulynn Cadet M.D.   On: 04/20/2019 15:26   US Thoracentesis Asp Pleural Space W/img Guide  Result Date: 04/07/2019 INDICATION: Patient with history of adenocarcinoma of right lung, pulmonary embolism of left lung, dyspnea, and right pleural effusion. Request is made for diagnostic and therapeutic right thoracentesis. EXAM: ULTRASOUND GUIDED DIAGNOSTIC AND THERAPEUTIC RIGHT THORACENTESIS MEDICATIONS: 10 mL of 1% lidocaine COMPLICATIONS: None immediate. PROCEDURE: An ultrasound guided thoracentesis was thoroughly discussed with the patient and questions answered. The benefits, risks, alternatives and complications were also  discussed. The patient understands and wishes to proceed with the procedure. Written consent was obtained. Ultrasound was performed to localize and mark an adequate pocket of fluid in the right chest. The area was then prepped and draped in the normal sterile fashion. 1% Lidocaine was used for local anesthesia. Under ultrasound guidance a 6 Fr Safe-T-Centesis catheter was introduced. Thoracentesis  was performed. The catheter was removed and a dressing applied. FINDINGS: A total of approximately 450 mL of clear yellow fluid was removed. Samples were sent to the laboratory as requested by the clinical team. IMPRESSION: Successful ultrasound guided right thoracentesis yielding 450 mL of pleural fluid. Read by: Earley Abide, PA-C Electronically Signed   By: Aletta Edouard M.D.   On: 04/07/2019 14:04    Labs:  CBC: Recent Labs    04/20/19 0522 04/21/19 0636 04/22/19 0552 04/23/19 0530  WBC 14.6* 17.0* 17.4* 19.0*  HGB 7.7* 8.0* 8.7* 9.2*  HCT 23.5* 25.5* 27.5* 28.6*  PLT 190 232 258 254    COAGS: Recent Labs    01/11/19 1249 04/06/19 2217 04/19/19 1242  INR 1.1 1.2 1.2  APTT 31 34 26    BMP: Recent Labs    04/20/19 0522 04/21/19 0636 04/22/19 0552 04/23/19 0530  NA 133* 135 135 136  K 4.2 3.5 3.5 3.6  CL 101 98 97* 97*  CO2 24 27 27 26   GLUCOSE 95 123* 94 90  BUN 12 11 11 11   CALCIUM 7.7* 7.9* 7.9* 8.2*  CREATININE 0.38* 0.46* 0.54* 0.47*  GFRNONAA >60 >60 >60 >60  GFRAA >60 >60 >60 >60    LIVER FUNCTION TESTS: Recent Labs    04/06/19 1134 04/19/19 1242 04/20/19 0522 04/21/19 0636  BILITOT 0.8 0.8 0.9 0.5  AST 15 17 20 16   ALT 6 16 12 12   ALKPHOS 85 82 52 68  PROT 6.1* 6.9 5.6* 5.7*  ALBUMIN 2.2* 2.3* 1.9* 1.8*    TUMOR MARKERS: No results for input(s): AFPTM, CEA, CA199, CHROMGRNA in the last 8760 hours.  Assessment and Plan:  58 y/o M with stage III lung cancer s/p chemoradiation currently admitted for sepsis 2/2 aspiration/pneumonia with recurrent  right sided highly loculated pleural effusion. He was previously seen in IR on 9/26 and 10/9 for diagnostic and therapeutic thoracentesis - IR was consulted for possible Pleurx placement due to recurrent pleural effusion. Patient history and imaging were reviewed by Dr. Anselm Pancoast who felt that patient was not a candidate for Pleurx placement due to the highly loculated effusion, however if pulmonology felt that he would be a candidate for tPA administration via chest tube we could potentially place a chest tube for this. Pulmonology was consulted and deemed patient a candidate for tPA administration and IR has been asked to place a large bore chest tube.  Patient received Lovenox today and had already eaten - we will plan to place chest tube tomorrow (10/14). Lovenox to be held tomorrow, NPO after midnight, pre-procedure labs in AM.  Risks and benefits discussed with the patient including bleeding, infection, damage to adjacent structures, malfunction of the catheter with need for additional procedures.  All of the patient's questions were answered, patient is agreeable to proceed.  Consent signed and in chart.  Thank you for this interesting consult.  I greatly enjoyed meeting JAELAN RASHEED and look forward to participating in their care.  A copy of this report was sent to the requesting provider on this date.  Electronically Signed: Joaquim Nam, PA-C 04/24/2019, 2:45 PM   I spent a total of 20 Minutes  in face to face in clinical consultation, greater than 50% of which was counseling/coordinating care for right chest tube placement.

## 2019-04-24 NOTE — Progress Notes (Signed)
PT Cancellation Note  Patient Details Name: Tyler [name] MRN: [id number] DOB: 08/19/1960   Cancelled Treatment:    Reason Eval/Treat Not Completed: Fatigue/lethargy limiting ability to participate - pt just got back from XR, pt very fatigued and requesting PT check back tomorrow.   Julien Girt, PT Acute Rehabilitation Services Pager [phone]  Office [phone]    Tyler [name] D Elonda Husky 04/24/2019, 3:50 PM

## 2019-04-24 NOTE — Consult Note (Signed)
NAME:  Tyler [name], MRN:  [id number], DOB:  1961/03/16, LOS: 5 ADMISSION DATE:  04/19/2019, CONSULTATION DATE:  10/13 REFERRING MD:  samtini, CHIEF COMPLAINT:  Recurrent and loculated pleural effusion    Brief History                                                                                                                                         58 year old male patient with history of stage III lung cancer currently on chemotherapy, status post 30 cycles of XRT admitted on 10/8 with a working diagnosis of postobstructive pneumonia and parapneumonic right pleural effusion.  Pulmonary asked to evaluate on 10/13 for consideration of possible chest tube and administration of TPA/Pulmozyme                                                                                                                                                                                                                                                 History of present illness   58 year old black male with significant medical history as listed below presented to the emergency room on 10/8 with chief complaint of chest pain, involving the right chest worsening Malays and worsening shortness of breath.  Apparently chest x-ray was unremarkable so he was released to home with low-dose Percocet.  He returned to the emergency room later that evening with a temperature of 103.5, CT scan was obtained worrisome for pneumonia and worsening moderate size right pleural effusion.  He was given IV antibiotics, fluid resuscitation and supplemental oxygen.  He underwent therapeutic and diagnostic thoracentesis on 10/9 This yielded 1.2 L of hazy yellow fluid, this was exudative by light's criteria, but cultures were negative, I do not see cytology.  He was being treated for working  diagnosis of postobstructive pneumonia and parapneumonic pleural effusion.  He had actually had a pleural effusion tapped back on 9/26 as well at that time cytology  was negative.  Thoracic surgery was called, they did not feel he was a candidate for thoracotomy, interventional radiology was called for possible Pleurx tube however they recommended pulmonary evaluation given concern about loculated pleural findings and felt possible traditional chest tube placement and consideration for TPA and Pulmozyme might be more appropriate because of this pulmonary was asked to evaluate.  Past Medical History  Stage III lung cancer diagnosed July 2020 last chemotherapy received 8/24 carboplatin/paclitaxel (s/p 5 cycles) He is status post 30 fractions of XRT. Most recent hospital admit 9/25 for acute pulmonary emboli with bilateral lower extremity DVTs, placed on Lovenox History of EtOH abuse History of tobacco abuse  Significant Hospital Events   10/8: Admitted with working diagnosis of postobstructive pneumonia and pleural effusion 10/9: Therapeutic and diagnostic thoracentesis performed on the right side yielding 1.2 L of pleural fluid, fluid evaluation was consistent with exudate cytology not sent Cultures negative.  Case discussed with thoracic surgery felt not a candidate for thoracotomy 10/12: Interventional radiology contacted for evaluation of possible Pleurx catheter They felt they felt given loculated findings might be better served with chest tube  10/13: Pulmonary asked to evaluate Consults:  Thoracic surgery consulted over phone by internal medicine 10/10 deemed not a thoracic surgery candidate Interventional radiology consulted 10/12 Pulmonary: Consulted 10/13  Procedures:  Right pleural effusion thoracentesis 10/9: Exudate  Significant Diagnostic Tests:  CT chest 10/8:Increased right pleural effusion is noted with associated right basilar atelectasis or infiltrate. Stable left midlung and basilar opacity as described above.  Micro Data:   Pleural fluid 10/9: Negative Blood cultures 10/8: 1 out of 2 coag negative staph Sputum culture 10/9:  Normal flora  Antimicrobials:  Unasyn 10/8  Interim history/subjective:  Awake and alert no focal def   Objective   Blood pressure 118/78, pulse 96, temperature 98.6 F (37 C), temperature source Oral, resp. rate 16, height 6' 2.02" (1.88 m), weight 59 kg, SpO2 90 %.        Intake/Output Summary (Last 24 hours) at 04/24/2019 1025 Last data filed at 04/24/2019 2956 Gross per 24 hour  Intake 900.13 ml  Output 375 ml  Net 525.13 ml   Filed Weights   04/19/19 2321  Weight: 59 kg    Examination: General: frail 58 year old aam resting in bed. He is in no acute distress.  HENT: NCAT no JVD some temporal wasint noted.  Lungs: decreased on right. No accessory use  Cardiovascular: RRR w/out MRG  Abdomen: soft not tender  Extremities: brisk CR warm good pulses  Neuro: awake and oriented no focal def  GU: voids   Resolved Hospital Problem list     Assessment & Plan:  Stage III recurrent lung cancer Probable post obstructive PNA Recurrent exudative effusion (now loculated) Cancer related malnutrition   Discussion Agree would not place Pleurx w/ complicated pleural effusion. Needs CT and possibly TPA and DNase   Plan IR to place guided Chest tube We will follow, may consider f/u CT after drainage vs just go ahead w/ TPA and DNase.  Cont abx    Labs   CBC: Recent Labs  Lab 04/19/19 0424 04/19/19 1242 04/20/19 0522 04/21/19 0636 04/22/19 0552 04/23/19 0530  WBC 10.6* 10.4 14.6* 17.0* 17.4* 19.0*  NEUTROABS 7.9*  --   --  15.1* 14.9* 15.8*  HGB 7.2* 7.8*  7.7* 8.0* 8.7* 9.2*  HCT 22.5* 24.6* 23.5* 25.5* 27.5* 28.6*  MCV 86.2 86.9 86.1 88.5 86.5 83.6  PLT 215 225 190 232 258 409    Basic Metabolic Panel: Recent Labs  Lab 04/19/19 1242 04/20/19 0522 04/21/19 0636 04/22/19 0552 04/23/19 0530  NA 132* 133* 135 135 136  K 4.0 4.2 3.5 3.5 3.6  CL 96* 101 98 97* 97*  CO2 25 24 27 27 26   GLUCOSE 156* 95 123* 94 90  BUN 14 12 11 11 11   CREATININE 0.47*  0.38* 0.46* 0.54* 0.47*  CALCIUM 8.2* 7.7* 7.9* 7.9* 8.2*   GFR: Estimated Creatinine Clearance: 81.9 mL/min (A) (by C-G formula based on SCr of 0.47 mg/dL (L)). Recent Labs  Lab 04/19/19 1235  04/19/19 1457 04/19/19 1857 04/20/19 0522 04/21/19 0636 04/22/19 0552 04/23/19 0530  PROCALCITON  --   --   --  12.09 7.23 9.45  --   --   WBC  --    < >  --   --  14.6* 17.0* 17.4* 19.0*  LATICACIDVEN 2.0*  --  1.6  --   --   --   --   --    < > = values in this interval not displayed.    Liver Function Tests: Recent Labs  Lab 04/19/19 1242 04/20/19 0522 04/21/19 0636  AST 17 20 16   ALT 16 12 12   ALKPHOS 82 52 68  BILITOT 0.8 0.9 0.5  PROT 6.9 5.6* 5.7*  ALBUMIN 2.3* 1.9* 1.8*   No results for input(s): LIPASE, AMYLASE in the last 168 hours. No results for input(s): AMMONIA in the last 168 hours.  ABG    Component Value Date/Time   PHART 7.514 (H) 08/08/2017 1056   PCO2ART 33.5 08/08/2017 1056   PO2ART 66.4 (L) 08/08/2017 1056   HCO3 26.5 08/08/2017 1056   O2SAT 91.8 08/08/2017 1056     Coagulation Profile: Recent Labs  Lab 04/19/19 1242  INR 1.2    Cardiac Enzymes: No results for input(s): CKTOTAL, CKMB, CKMBINDEX, TROPONINI in the last 168 hours.  HbA1C: No results found for: HGBA1C  CBG: No results for input(s): GLUCAP in the last 168 hours.  Review of Systems:   Review of Systems:   Bolds are positive  Constitutional: weight loss, gain, night sweats, Fevers, chills, fatigue .  HEENT: headaches, Sore throat, sneezing, nasal congestion, post nasal drip, Difficulty swallowing, Tooth/dental problems, visual complaints visual changes, ear ache CV:  chest pain on right ,Orthopnea, PND, swelling in lower extremities, dizziness, palpitations, syncope.  GI  heartburn, indigestion, abdominal pain, nausea, vomiting, diarrhea, change in bowel habits, loss of appetite, bloody stools.  Resp: cough, productive: yellow  , hemoptysis, dyspnea + , chest pain, pleuritic.    Skin: rash or itching or icterus GU: dysuria, change in color of urine, urgency or frequency. flank pain, hematuria  MS: joint pain or swelling. decreased range of motion  Psych: change in mood or affect. depression or anxiety.  Neuro: difficulty with speech, weakness, numbness, ataxia    Past Medical History  He,  has a past medical history of Alcohol abuse, Chronic back pain, COPD (chronic obstructive pulmonary disease) ([place]), Dyspnea, Lung mass, Lung nodule, Multiple pulmonary nodules (04/02/2018), Pneumonia, Right lower lobe pulmonary nodule (04/02/2018), and Tobacco abuse.   Surgical History    Past Surgical History:  Procedure Laterality Date   APPENDECTOMY     VIDEO BRONCHOSCOPY WITH ENDOBRONCHIAL NAVIGATION N/A 04/05/2018   Procedure: VIDEO BRONCHOSCOPY WITH ENDOBRONCHIAL  NAVIGATION;  Surgeon: Garner Nash, DO;  Location: [place];  Service: Thoracic;  Laterality: N/A;   VIDEO BRONCHOSCOPY WITH ENDOBRONCHIAL NAVIGATION N/A 01/11/2019   Procedure: VIDEO BRONCHOSCOPY WITH ENDOBRONCHIAL NAVIGATION;  Surgeon: Garner Nash, DO;  Location: [place];  Service: Thoracic;  Laterality: N/A;   VIDEO BRONCHOSCOPY WITH ENDOBRONCHIAL ULTRASOUND N/A 01/11/2019   Procedure: VIDEO BRONCHOSCOPY WITH ENDOBRONCHIAL ULTRASOUND;  Surgeon: Garner Nash, DO;  Location: [place];  Service: Thoracic;  Laterality: N/A;   VIDEO BRONCHOSCOPY WITH RADIAL ENDOBRONCHIAL ULTRASOUND N/A 01/11/2019   Procedure: VIDEO BRONCHOSCOPY WITH RADIAL ENDOBRONCHIAL ULTRASOUND;  Surgeon: Garner Nash, DO;  Location: [place];  Service: Thoracic;  Laterality: N/A;   [place] History   reports that he has been smoking cigarettes. He has a 10.00 pack-year smoking history. He has never used smokeless tobacco. He reports previous alcohol use. He reports previous drug use. Drug: Marijuana.   Family History   His family history includes Heart attack in his maternal grandfather.   Allergies No  Known Allergies   Home Medications  Prior to Admission medications   Medication Sig Start Date End Date Taking? Authorizing Provider  albuterol (PROVENTIL HFA;VENTOLIN HFA) 108 (90 Base) MCG/ACT inhaler Inhale 2 puffs into the lungs every 6 (six) hours as needed for wheezing or shortness of breath. 06/27/18  Yes Icard, Bradley L, DO  enoxaparin (LOVENOX) 100 MG/ML injection Inject 0.9 mLs (90 mg total) into the skin daily. 04/11/19 05/11/19 Yes GhimireDante Gang, MD  feeding supplement, ENSURE ENLIVE, (ENSURE ENLIVE) LIQD Take 237 mLs by mouth 3 (three) times daily between meals. Patient taking differently: Take 237 mLs by mouth daily.  08/11/17  Yes Ghimire, Henreitta Leber, MD  ibuprofen (ADVIL) 200 MG tablet Take 400 mg by mouth every 6 (six) hours as needed for fever, headache or moderate pain.   Yes [provider]  Magnesium Oxide 400 MG CAPS Take 1 capsule (400 mg total) by mouth 2 (two) times daily. 04/10/19 05/10/19 Yes Ghimire, Dante Gang, MD  potassium chloride SA (K-DUR) 20 MEQ tablet Take 1 tablet (20 mEq total) by mouth daily. 04/06/19  Yes Tish Men, MD  sucralfate (CARAFATE) 1 g tablet Take 1 g by mouth 4 (four) times daily -  with meals and at bedtime.   Yes [provider]  tiotropium (SPIRIVA HANDIHALER) 18 MCG inhalation capsule Place 1 capsule (18 mcg total) into inhaler and inhale daily. 06/27/18 01/05/20 Yes Icard, Octavio Graves, DO  lidocaine (XYLOCAINE) 2 % solution 5 ml swallow before meals for radiation esophagitis Patient taking differently: 5 ml swallow before meals for radiation esophagitis as needed to numb 03/30/19   [name], MD  oxyCODONE-acetaminophen (PERCOCET) 5-325 MG tablet Take 1-2 tablets by mouth every 4 (four) hours as needed for moderate pain or severe pain. 04/19/19   Orpah Greek, MD           Erick Colace ACNP-BC [facility] Pager # [phone] OR # [phone] if no answer

## 2019-04-25 ENCOUNTER — Inpatient Hospital Stay (HOSPITAL_COMMUNITY): Payer: Medicaid Other

## 2019-04-25 ENCOUNTER — Telehealth: Payer: Self-pay | Admitting: Medical Oncology

## 2019-04-25 ENCOUNTER — Inpatient Hospital Stay: Payer: Medicaid Other | Admitting: Physician Assistant

## 2019-04-25 ENCOUNTER — Inpatient Hospital Stay: Payer: Medicaid Other

## 2019-04-25 DIAGNOSIS — I4891 Unspecified atrial fibrillation: Secondary | ICD-10-CM | POA: Diagnosis not present

## 2019-04-25 DIAGNOSIS — A419 Sepsis, unspecified organism: Principal | ICD-10-CM

## 2019-04-25 LAB — CBC WITH DIFFERENTIAL/PLATELET
Abs Immature Granulocytes: 0.11 10*3/uL — ABNORMAL HIGH (ref 0.00–0.07)
Basophils Absolute: 0 10*3/uL (ref 0.0–0.1)
Basophils Relative: 0 %
Eosinophils Absolute: 0.1 10*3/uL (ref 0.0–0.5)
Eosinophils Relative: 1 %
HCT: 24.4 % — ABNORMAL LOW (ref 39.0–52.0)
Hemoglobin: 7.7 g/dL — ABNORMAL LOW (ref 13.0–17.0)
Immature Granulocytes: 1 %
Lymphocytes Relative: 7 %
Lymphs Abs: 0.9 10*3/uL (ref 0.7–4.0)
MCH: 27.1 pg (ref 26.0–34.0)
MCHC: 31.6 g/dL (ref 30.0–36.0)
MCV: 85.9 fL (ref 80.0–100.0)
Monocytes Absolute: 1.1 10*3/uL — ABNORMAL HIGH (ref 0.1–1.0)
Monocytes Relative: 9 %
Neutro Abs: 10.6 10*3/uL — ABNORMAL HIGH (ref 1.7–7.7)
Neutrophils Relative %: 82 %
Platelets: 211 10*3/uL (ref 150–400)
RBC: 2.84 MIL/uL — ABNORMAL LOW (ref 4.22–5.81)
RDW: 20 % — ABNORMAL HIGH (ref 11.5–15.5)
WBC: 12.9 10*3/uL — ABNORMAL HIGH (ref 4.0–10.5)
nRBC: 0 % (ref 0.0–0.2)

## 2019-04-25 LAB — BASIC METABOLIC PANEL
Anion gap: 8 (ref 5–15)
BUN: 8 mg/dL (ref 6–20)
CO2: 30 mmol/L (ref 22–32)
Calcium: 7.6 mg/dL — ABNORMAL LOW (ref 8.9–10.3)
Chloride: 99 mmol/L (ref 98–111)
Creatinine, Ser: 0.41 mg/dL — ABNORMAL LOW (ref 0.61–1.24)
GFR calc Af Amer: >60 mL/min (ref >60)
GFR calc non Af Amer: >60 mL/min (ref >60)
Glucose, Bld: 104 mg/dL — ABNORMAL HIGH (ref 70–99)
Potassium: 3.1 mmol/L — ABNORMAL LOW (ref 3.5–5.1)
Sodium: 137 mmol/L (ref 135–145)

## 2019-04-25 LAB — MAGNESIUM: Magnesium: 1.5 mg/dL — ABNORMAL LOW (ref 1.7–2.4)

## 2019-04-25 LAB — PROTIME-INR
INR: 1.1 (ref 0.8–1.2)
Prothrombin Time: 13.6 seconds (ref 11.4–15.2)

## 2019-04-25 LAB — TSH: TSH: 2.244 u[IU]/mL (ref 0.350–4.500)

## 2019-04-25 MED ORDER — MAGNESIUM SULFATE IN D5W 1-5 GM/100ML-% IV SOLN
1.0000 g | INTRAVENOUS | Status: AC
  Administered 2019-04-25 (×2): 1 g via INTRAVENOUS
  Filled 2019-04-25 (×2): qty 100

## 2019-04-25 MED ORDER — MIDAZOLAM HCL 2 MG/2ML IJ SOLN
INTRAMUSCULAR | Status: AC | PRN
  Administered 2019-04-25 (×2): 0.5 mg via INTRAVENOUS

## 2019-04-25 MED ORDER — DILTIAZEM LOAD VIA INFUSION: 15.0000 mg | Freq: Once | INTRAVENOUS | Status: DC

## 2019-04-25 MED ORDER — LIDOCAINE HCL (PF) 1 % IJ SOLN
INTRAMUSCULAR | Status: AC | PRN
  Administered 2019-04-25: 10 mL
  Administered 2019-04-25: 5 mL

## 2019-04-25 MED ORDER — ENOXAPARIN SODIUM 60 MG/0.6ML ~~LOC~~ SOLN
60.0000 mg | Freq: Once | SUBCUTANEOUS | Status: AC
  Administered 2019-04-25: 60 mg via SUBCUTANEOUS
  Filled 2019-04-25: qty 0.6

## 2019-04-25 MED ORDER — METOPROLOL TARTRATE 5 MG/5ML IV SOLN
5.0000 mg | INTRAVENOUS | Status: DC | PRN
  Administered 2019-04-25: 5 mg via INTRAVENOUS
  Filled 2019-04-25: qty 5

## 2019-04-25 MED ORDER — IBUPROFEN 200 MG PO TABS
400.0000 mg | ORAL_TABLET | Freq: Four times a day (QID) | ORAL | Status: DC | PRN
  Administered 2019-04-27 – 2019-04-28 (×3): 400 mg via ORAL
  Filled 2019-04-25 (×3): qty 2

## 2019-04-25 MED ORDER — MIDAZOLAM HCL 2 MG/2ML IJ SOLN
INTRAMUSCULAR | Status: AC
  Filled 2019-04-25: qty 6

## 2019-04-25 MED ORDER — DILTIAZEM HCL 25 MG/5ML IV SOLN
15.0000 mg | Freq: Once | INTRAVENOUS | Status: AC
  Administered 2019-04-25: 14:00:00 15 mg via INTRAVENOUS
  Filled 2019-04-25: qty 5

## 2019-04-25 MED ORDER — MAGNESIUM SULFATE IN D5W 1-5 GM/100ML-% IV SOLN: 1.0000 g | INTRAVENOUS | Status: DC

## 2019-04-25 MED ORDER — SODIUM CHLORIDE 0.9% FLUSH
5.0000 mL | Freq: Three times a day (TID) | INTRAVENOUS | Status: DC
  Administered 2019-04-25 – 2019-04-30 (×8): 5 mL

## 2019-04-25 MED ORDER — FENTANYL CITRATE (PF) 100 MCG/2ML IJ SOLN
INTRAMUSCULAR | Status: AC | PRN
  Administered 2019-04-25: 25 ug via INTRAVENOUS

## 2019-04-25 MED ORDER — ENOXAPARIN SODIUM 100 MG/ML ~~LOC~~ SOLN
90.0000 mg | SUBCUTANEOUS | Status: DC
  Administered 2019-04-26 – 2019-04-28 (×3): 90 mg via SUBCUTANEOUS
  Filled 2019-04-25 (×4): qty 1

## 2019-04-25 MED ORDER — MORPHINE SULFATE (PF) 2 MG/ML IV SOLN
1.0000 mg | INTRAVENOUS | Status: DC | PRN
  Administered 2019-04-25 – 2019-04-26 (×4): 1 mg via INTRAVENOUS
  Filled 2019-04-25 (×4): qty 1

## 2019-04-25 MED ORDER — FENTANYL CITRATE (PF) 100 MCG/2ML IJ SOLN
INTRAMUSCULAR | Status: AC
  Filled 2019-04-25: qty 4

## 2019-04-25 MED ORDER — OXYCODONE-ACETAMINOPHEN 5-325 MG PO TABS
1.0000 | ORAL_TABLET | ORAL | Status: DC | PRN
  Administered 2019-04-25 – 2019-04-28 (×13): 2 via ORAL
  Filled 2019-04-25 (×16): qty 2

## 2019-04-25 NOTE — Progress Notes (Signed)
PT Cancellation Note  Patient Details Name: Tyler [name] MRN: [id number] DOB: 1961-07-03   Cancelled Treatment:     CT Guided R chest drain placement.  Will attempt to see another day as schedule permits.   Rica Koyanagi  PTA Acute  Rehabilitation Services Pager      [phone] Office      [phone]

## 2019-04-25 NOTE — Procedures (Signed)
  Procedure: CT guided R chest drain placement 45f EBL:   minimal Complications:  none immediate  See full dictation in [facility].  Dillard Cannon MD Main # [phone] Pager  [phone]

## 2019-04-25 NOTE — Telephone Encounter (Signed)
Please fax pt insurance card. Message sent to [place].

## 2019-04-25 NOTE — Progress Notes (Signed)
[facility]  PROGRESS NOTE  Tyler [name] CWC:[id number] DOB: [birth year] DOA: 04/19/2019 PCP: Brand Males, MD  Brief History    Tyler [name] is a 58 y.o. year old male with medical history significant for recently diagnosed stage III lung cancer with C7/2020, completed chemotherapy cycles), recent PE/bilateral DVT on Lovenox (diagnosed 9/25), who initially presented to ED on 04/19/2019 with worsening right-sided chest pain and due to no acute findings on  CXR sent home with perocet. He returned later that same day with  Confusion, fever and shortness of breath and admitted for sepsis secondary to pneumonia in setting of malignancy.  Hospital Course:  CT chest showing loculated right pleural effusion concern for parapneumonic effusion given neutrophilic exudate and low glucose in diagnostic studies (therapeutic/diagnostic thoracentesis on 10/9, 1.2 L removed, cultures negative).  Case was discussed with thoracic surgery (10/10) who felt patient was medicated for thoracotomy.  Case was discussed with IR for possible Pleurx catheter but given loculated findings recommended pulmonary consultation.  Patient underwent IR guided chest tube  A & P     Atrial fibrillation with RVR.  Heart rate elevated to 160s-170s shortly after returning from chest tube procedure, remains alert and hemodynamically stable otherwise.  Monitor response to diltiazem 15 mg push, if RVR continues will need to transition to stepdown unit for diltiazem infusion.  Goal K greater than 4, magnesium greater than 2, check TSH, EKG as needed chest pain.    Loculated right pleural effusion, concern for parapneumonic effusion.  Patient underwent IR guided chest tube on 10/14, ultimate plan for TPA and DNase to potentially break loculations per pulmonology.   Pneumonia, most likely postobstructive.  Stable on 4 L O2, no worsening respiratory effort.  Continue Unasyn, day 6, remains afebrile, WBC downtrending from  19-12.9, blood cultures unremarkable   Pulmonary embolism/bilateral DVTs, malignancy related.  Will resume Lovenox at reduced dose due to recent procedure before reinitiating home dose in 24 hours, appreciate pharmacology help   Acute on chronic hypoxic respiratory failure, stable.  Currently on 4 L has been stable for the past 24 hours.  Multifactorial etiology due to above.  See if able to wean O2 with chest tube placement, continue antibiotics.   Hypokalemia/hypomagnesemia.  Potassium 3.1, magnesium 1.5, will correct especially in setting of A. fib with RVR.  Repeat in a.m.   Stage III non-small lung cancer, diagnosed 01/2019.  Patient has evidence of disease progression despite chemoradiation treatment. Dr. [name] made aware of patient's status by admitting physician   COPD.  No wheezing.  No worsening respiratory effort.  Continue Incruse.  Reflux, stable.  Continue Carafate   Malnutrition, moderate, likely in the setting of malignancy.  Dietary consulted, following nutritional supplementation recommendations.      DVT prophylaxis: Lovenox Code Status: Full Family Communication: Wife updated at bedside Disposition Plan: DNI/TPA per pulmonology via chest tube, heart rate control for A. fib with RVR     [facility] Direct contact: see [email or web address] (further directions at bottom of note if needed) 7PM-7AM contact night coverage as at bottom of note 04/25/2019, 2:52 PM  LOS: 6 days   Consultants  . IR, pulmonology  Procedures  . IR guided chest tube placement ((right-sided), 10/14 . Therapeutic/diagnostic thoracentesis 10/9  Antibiotics  . Unasyn 10/8-- . Cefepime 10/8  Interval History/Subjective  Feels his breathing is much better after chest tube placed Denies any chest pain  Objective   Vitals:  Vitals:   04/25/19 1350 04/25/19 1414  BP: 100/71  111/68  Pulse: 79 (!) 130  Resp:    Temp:    SpO2: 92% 93%    Exam:  Awake Alert, oriented,  frail/cachectic African-American male, no distress No JVD Symmetrical Chest wall movement, diminished breath sounds throughout, normal respiratory effort on 4 L O2, no increased work of breathing  Irregularly irregular rhythm, tachycardic  Abd Soft, No tenderness,  No edema,   I have personally reviewed the following:   Data Reviewed: Basic Metabolic Panel: Recent Labs  Lab 04/20/19 0522 04/21/19 0636 04/22/19 0552 04/23/19 0530 04/25/19 0646  NA 133* 135 135 136 137  K 4.2 3.5 3.5 3.6 3.1*  CL 101 98 97* 97* 99  CO2 24 27 27 26 30   GLUCOSE 95 123* 94 90 104*  BUN 12 11 11 11 8   CREATININE 0.38* 0.46* 0.54* 0.47* 0.41*  CALCIUM 7.7* 7.9* 7.9* 8.2* 7.6*  MG  --   --   --   --  1.5*   Liver Function Tests: Recent Labs  Lab 04/19/19 1242 04/20/19 0522 04/21/19 0636  AST 17 20 16   ALT 16 12 12   ALKPHOS 82 52 68  BILITOT 0.8 0.9 0.5  PROT 6.9 5.6* 5.7*  ALBUMIN 2.3* 1.9* 1.8*   No results for input(s): LIPASE, AMYLASE in the last 168 hours. No results for input(s): AMMONIA in the last 168 hours. CBC: Recent Labs  Lab 04/19/19 0424  04/20/19 0522 04/21/19 0636 04/22/19 0552 04/23/19 0530 04/25/19 0646  WBC 10.6*   < > 14.6* 17.0* 17.4* 19.0* 12.9*  NEUTROABS 7.9*  --   --  15.1* 14.9* 15.8* 10.6*  HGB 7.2*   < > 7.7* 8.0* 8.7* 9.2* 7.7*  HCT 22.5*   < > 23.5* 25.5* 27.5* 28.6* 24.4*  MCV 86.2   < > 86.1 88.5 86.5 83.6 85.9  PLT 215   < > 190 232 258 254 211   < > = values in this interval not displayed.   Cardiac Enzymes: No results for input(s): CKTOTAL, CKMB, CKMBINDEX, TROPONINI in the last 168 hours. BNP (last 3 results) Recent Labs    04/06/19 2217 04/19/19 1242  BNP 251.3* 136.6*    ProBNP (last 3 results) No results for input(s): PROBNP in the last 8760 hours.  CBG: No results for input(s): GLUCAP in the last 168 hours.  Recent Results (from the past 240 hour(s))  Blood Culture (routine x 2)     Status: Abnormal   Collection Time:  04/19/19 12:42 PM   Specimen: BLOOD RIGHT FOREARM  Result Value Ref Range Status   Specimen Description   Final    BLOOD RIGHT FOREARM Performed at [facility] [address], [place], [place] [address]    Special Requests   Final    BOTTLES DRAWN AEROBIC AND ANAEROBIC Blood Culture adequate volume Performed at [facility] [address] [place], [place] [address]    Culture  Setup Time   Final    GRAM POSITIVE COCCI AEROBIC BOTTLE ONLY CRITICAL RESULT CALLED TO, READ BACK BY AND VERIFIED WITH: Melodye Ped PharmD 10:35 04/20/19 ([email or web address])    Culture (A)  Final    STAPHYLOCOCCUS SPECIES (COAGULASE NEGATIVE) THE SIGNIFICANCE OF ISOLATING THIS ORGANISM FROM A SINGLE SET OF BLOOD CULTURES WHEN MULTIPLE SETS ARE DRAWN IS UNCERTAIN. PLEASE NOTIFY THE MICROBIOLOGY DEPARTMENT WITHIN ONE WEEK IF SPECIATION AND SENSITIVITIES ARE REQUIRED. Performed at [facility], [place] [address], [place], [place] [address]    Report Status  04/21/2019 FINAL  Final  Blood Culture ID Panel (Reflexed)     Status: Abnormal   Collection Time: 04/19/19 12:42 PM  Result Value Ref Range Status   Enterococcus species NOT DETECTED NOT DETECTED Final   Listeria monocytogenes NOT DETECTED NOT DETECTED Final   Staphylococcus species DETECTED (A) NOT DETECTED Final    Comment: Methicillin (oxacillin) susceptible coagulase negative staphylococcus. Possible blood culture contaminant (unless isolated from more than one blood culture draw or clinical case suggests pathogenicity). No antibiotic treatment is indicated for blood  culture contaminants. CRITICAL RESULT CALLED TO, READ BACK BY AND VERIFIED WITH: Melodye Ped PharmD 10:35 04/20/19 ([email or web address])    Staphylococcus aureus (BCID) NOT DETECTED NOT DETECTED Final   Methicillin resistance NOT DETECTED NOT DETECTED Final   Streptococcus species NOT DETECTED NOT DETECTED Final   Streptococcus agalactiae NOT DETECTED NOT DETECTED Final    Streptococcus pneumoniae NOT DETECTED NOT DETECTED Final   Streptococcus pyogenes NOT DETECTED NOT DETECTED Final   Acinetobacter baumannii NOT DETECTED NOT DETECTED Final   Enterobacteriaceae species NOT DETECTED NOT DETECTED Final   Enterobacter cloacae complex NOT DETECTED NOT DETECTED Final   Escherichia coli NOT DETECTED NOT DETECTED Final   Klebsiella oxytoca NOT DETECTED NOT DETECTED Final   Klebsiella pneumoniae NOT DETECTED NOT DETECTED Final   Proteus species NOT DETECTED NOT DETECTED Final   Serratia marcescens NOT DETECTED NOT DETECTED Final   Haemophilus influenzae NOT DETECTED NOT DETECTED Final   Neisseria meningitidis NOT DETECTED NOT DETECTED Final   Pseudomonas aeruginosa NOT DETECTED NOT DETECTED Final   Candida albicans NOT DETECTED NOT DETECTED Final   Candida glabrata NOT DETECTED NOT DETECTED Final   Candida krusei NOT DETECTED NOT DETECTED Final   Candida parapsilosis NOT DETECTED NOT DETECTED Final   Candida tropicalis NOT DETECTED NOT DETECTED Final    Comment: Performed at [facility], [address] [place], [place] [address]  SARS Coronavirus 2 by RT PCR (hospital order, performed in [facility] hospital lab) Nasopharyngeal Nasopharyngeal Swab     Status: None   Collection Time: 04/19/19  2:13 PM   Specimen: Nasopharyngeal Swab  Result Value Ref Range Status   SARS Coronavirus 2 NEGATIVE NEGATIVE Final    Comment: (NOTE) If result is NEGATIVE SARS-CoV-2 target nucleic acids are NOT DETECTED. The SARS-CoV-2 RNA is generally detectable in upper and lower  respiratory specimens during the acute phase of infection. The lowest  concentration of SARS-CoV-2 viral copies this assay can detect is 250  copies / mL. A negative result does not preclude SARS-CoV-2 infection  and should not be used as the sole basis for treatment or other  patient management decisions.  A negative result may occur with  improper specimen collection / handling, submission of  specimen other  than nasopharyngeal swab, presence of viral mutation(s) within the  areas targeted by this assay, and inadequate number of viral copies  (<250 copies / mL). A negative result must be combined with clinical  observations, patient history, and epidemiological information. If result is POSITIVE SARS-CoV-2 target nucleic acids are DETECTED. The SARS-CoV-2 RNA is generally detectable in upper and lower  respiratory specimens dur ing the acute phase of infection.  Positive  results are indicative of active infection with SARS-CoV-2.  Clinical  correlation with patient history and other diagnostic information is  necessary to determine patient infection status.  Positive results do  not rule out bacterial infection or co-infection with other viruses. If result is PRESUMPTIVE POSTIVE SARS-CoV-2  nucleic acids MAY BE PRESENT.   A presumptive positive result was obtained on the submitted specimen  and confirmed on repeat testing.  While 2019 novel coronavirus  (SARS-CoV-2) nucleic acids may be present in the submitted sample  additional confirmatory testing may be necessary for epidemiological  and / or clinical management purposes  to differentiate between  SARS-CoV-2 and other Sarbecovirus currently known to infect humans.  If clinically indicated additional testing with an alternate test  methodology [id number]) is advised. The SARS-CoV-2 RNA is generally  detectable in upper and lower respiratory sp ecimens during the acute  phase of infection. The expected result is Negative. Fact Sheet for Patients:  StrictlyIdeas.no Fact Sheet for Healthcare Providers: BankingDealers.co.za This test is not yet approved or cleared by the [place] FDA and has been authorized for detection and/or diagnosis of SARS-CoV-2 by FDA under an Emergency Use Authorization (EUA).  This EUA will remain in effect (meaning this test can be used) for the  duration of the COVID-19 declaration under Section 564(b)(1) of the Act, 21 U.S.C. section 360bbb-3(b)(1), unless the authorization is terminated or revoked sooner. Performed at [facility], [place] [address], [place], [place] [address]   Culture, blood (Routine X 2) w Reflex to ID Panel     Status: None   Collection Time: 04/19/19  3:00 PM   Specimen: BLOOD  Result Value Ref Range Status   Specimen Description   Final    BLOOD RIGHT HAND Performed at [facility] [address], [place], [place] [address]    Special Requests   Final    BOTTLES DRAWN AEROBIC AND ANAEROBIC Blood Culture results may not be optimal due to an inadequate volume of blood received in culture bottles Performed at [facility] [address], [place], [place] [address]    Culture   Final    NO GROWTH 5 DAYS Performed at [facility], [place] [address], [place], [place] [address]    Report Status 04/24/2019 FINAL  Final  Urine culture     Status: None   Collection Time: 04/19/19  3:13 PM   Specimen: In/Out Cath Urine  Result Value Ref Range Status   Specimen Description   Final    IN/OUT CATH URINE Performed at [facility] [address] [place], [place] [address]    Special Requests   Final    NONE Performed at [facility], [place] [address] [place], [place] [address]    Culture   Final    Multiple bacterial morphotypes present, none predominant. Suggest appropriate recollection if clinically indicated.   Report Status 04/20/2019 FINAL  Final  Culture, sputum-assessment     Status: None   Collection Time: 04/20/19 10:38 AM   Specimen: Sputum  Result Value Ref Range Status   Specimen Description SPUTUM  Final   Special Requests NONE  Final   Sputum evaluation   Final    THIS SPECIMEN IS ACCEPTABLE FOR SPUTUM CULTURE Performed at [facility], [place] [address] [place], [place]  [address]    Report Status 04/20/2019 FINAL  Final  Culture, respiratory     Status: None   Collection Time: 04/20/19 10:38 AM   Specimen: SPU  Result Value Ref Range Status   Specimen Description   Final    SPUTUM Performed at [facility] [address], [place], [place] [address]    Special Requests   Final  NONE Reflexed from [id number] Performed at [facility], [place] [address], [place], [place] [address]    Gram Stain   Final    RARE WBC PRESENT,BOTH PMN AND MONONUCLEAR RARE SQUAMOUS EPITHELIAL CELLS PRESENT FEW GRAM POSITIVE RODS RARE YEAST RARE GRAM NEGATIVE RODS FEW GRAM POSITIVE COCCI    Culture   Final    FEW Consistent with normal respiratory flora. Performed at [facility], [place] [address], [place], [place] [address]    Report Status 04/22/2019 FINAL  Final  Culture, body fluid-bottle     Status: None   Collection Time: 04/20/19  3:39 PM   Specimen: Fluid  Result Value Ref Range Status   Specimen Description FLUID PLEURAL  Final   Special Requests   Final    BOTTLES DRAWN AEROBIC AND ANAEROBIC Blood Culture adequate volume   Culture   Final    NO GROWTH Performed at [facility], [address], [place], [place] [address]    Report Status 04/23/2019 FINAL  Final  Gram stain     Status: None   Collection Time: 04/20/19  3:39 PM   Specimen: Fluid  Result Value Ref Range Status   Specimen Description FLUID PLEURAL  Final   Special Requests NONE  Final   Gram Stain   Final    RARE WBC PRESENT, PREDOMINANTLY PMN NO ORGANISMS SEEN Performed at [facility], [place] [address] [place], [place] [address]    Report Status 04/21/2019 FINAL  Final     Studies: Dg Chest 2 View  Result Date: 04/24/2019 CLINICAL DATA:  58 year old male with history of lung cancer status post chemotherapy. EXAM: CHEST - 2 VIEW COMPARISON:  Chest x-ray 04/21/2019. FINDINGS: Large right pleural effusion with extensive atelectasis  and/or consolidation in the base of the right lung. Small left pleural effusion. Patchy multifocal interstitial opacities widespread throughout the lungs bilaterally, with relative sparing of the apex of the left upper lobe. Pulmonary vasculature does not appearing origin. Heart size is normal. Upper mediastinal contours are within normal limits. IMPRESSION: 1. Patchy multifocal interstitial and airspace disease in the lungs bilaterally, asymmetrically distributed, favored to reflect a multilobar pneumonia. 2. Large right pleural effusion with areas of atelectasis and/or consolidation in the right lung base. Electronically Signed   By: Vinnie Langton M.D.   On: 04/24/2019 17:02    Scheduled Meds: . enoxaparin (LOVENOX) injection  60 mg Subcutaneous Once  . [START ON 04/26/2019] enoxaparin  90 mg Subcutaneous Q24H  . feeding supplement  1 Container Oral BID BM  . fentaNYL      . magnesium oxide  400 mg Oral BID  . mouth rinse  15 mL Mouth Rinse BID  . midazolam      . multivitamin with minerals  1 tablet Oral Daily  . polyethylene glycol  17 g Oral Daily  . potassium chloride SA  20 mEq Oral Daily  . sodium chloride flush  5 mL Intracatheter Q8H  . sucralfate  1 g Oral TID WC & HS  . umeclidinium bromide  1 puff Inhalation Daily   Continuous Infusions: . sodium chloride 10 mL/hr at 04/23/19 2355  . ampicillin-sulbactam (UNASYN) IV 3 g (04/25/19 0507)  . magnesium sulfate bolus IVPB      Principal Problem:   Aspiration pneumonia (HCC) Active Problems:   Acute respiratory failure with hypoxia (HCC)   Protein-calorie malnutrition, severe   Pulmonary embolism (HCC)   PNA (pneumonia)   Malnutrition of  moderate degree      Desiree Hane  [facility]

## 2019-04-25 NOTE — Progress Notes (Signed)
Nutrition Follow-up  DOCUMENTATION CODES:   Underweight, Non-severe (moderate) malnutrition in context of chronic illness  INTERVENTION:   -Boost Breeze po BID, each supplement provides 250 kcal and 9 grams of protein -[employer] with whole milk for breakfast and lunch meals, each supplement will provide 293 kcal and 13 grams protein.  NUTRITION DIAGNOSIS:   Moderate Malnutrition related to cancer and cancer related treatments, chronic illness as evidenced by severe muscle depletion, moderate fat depletion, moderate muscle depletion.  Ongoing  GOAL:   Patient will meet greater than or equal to 90% of their needs  Progressing.  MONITOR:   PO intake, Supplement acceptance, Labs, Weight trends  ASSESSMENT:   58 year-old male with medical history of stage 3 lunch cancer (dx 01/2019) on carboplatin and paclitaxel (s/p 5 cycles with last on 8/24) and XRT, alcohol abuse, and previous tobacco abuse which he quit several months ago. He was admitted in September for acute PE and bilateral DVT. He presented to the ED on 10/8 due to R chest pain and malaise. CXR did not show pneumothorax or effusion.  **RD working remotely**  Patient currently NPO for chest drain placement via IR. Pt was on a soft diet and consuming "bites" to 30% of meals. Pt has been drinking only 1 Boost Breeze daily.   Admission weight: 130 lbs. No new weights since admission.  I/Os: -114 ml since admit UOP 10/13: 800 ml  Medications: MAG-OX tablet, Multivitamin with minerals daily, KLOR-CON tablet, Carafate tablet Labs reviewed: Low K, Mg  Diet Order:   Diet Order            Diet NPO time specified Except for: Sips with Meds  Diet effective midnight              EDUCATION NEEDS:   Not appropriate for education at this time  Skin:  Skin Assessment: Reviewed RN Assessment  Last BM:  10/13  Height:   Ht Readings from Last 1 Encounters:  04/19/19 6' 2.02" (1.88 m)    Weight:    Wt Readings from Last 1 Encounters:  04/19/19 59 kg    Ideal Body Weight:  86.4 kg  BMI:  Body mass index is 16.68 kg/m.  Estimated Nutritional Needs:   Kcal:  2000-2200 kcal  Protein:  100-115 grams  Fluid:  >/= 2 L/day  Clayton Bibles, MS, RD, LDN Inpatient Clinical Dietitian Pager: [phone] After Hours Pager: [phone]

## 2019-04-25 NOTE — Progress Notes (Signed)
MEDICATION-RELATED CONSULT NOTE   IR Procedure Consult - Anticoagulant/Antiplatelet PTA/Inpatient Med List Review by Pharmacist    Procedure: Pleural drain placement with indwelling catheter    Completed: 10/14 at 11:48  Post-Procedural bleeding risk per IR MD assessment:  Standard  Antithrombotic medications on inpatient or PTA profile prior to procedure:   Lovenox 90mg  SQ daily (1.5mg /kg/24hr)    Recommended restart time per IR Post-Procedure Guidelines:    If pt on 1.5 mg/kg Q24h dose regimen give single 1mg /kg dose day of procedure and resume single daily dose the day (12 hrs later)    Other considerations:      Plan:     Lovenox 60mg  x1 at 8p on 10/14               Resume Lovenox 90mg  SQ daily on 10/15 at [place], [place] PharmD Pager [phone] 04/25/2019, 1:14 PM

## 2019-04-25 NOTE — Significant Event (Signed)
Rapid Response Event Note  Overview: Time Called: [place] Time: 1586 Event Type: MEWS and Afib RVR Notified by bedside RN staff and 5th floor charge RN, Jarrett Soho, patient just had right chest tube placed by IR but now in Afib RVR. Patient currently on telemetry.   Initial Focused Assessment: Neuro: Alert and oriented, able to follow commands Cardiac: Afib RVR 140s-170s, BP WNL-see vital signs Pulmonary: Breath sounds-Clear and diminished in upper and lower lung fields. No apparent respiratory distress. Patient stated that chest tube is helping his breathing.   Interventions: Notified MD Nettey due to Afib   Plan of Care (if not transferred): MD Nettey ordering cardizem 15mg  IV push, will continue to monitor patient on telemetry. If patient's Afib does not become controlled with IV cardizem 15mg , will transfer to SDU. Please call Rapid Response if patient has a change in clinical status.    Madolin Twaddle C

## 2019-04-26 ENCOUNTER — Inpatient Hospital Stay (HOSPITAL_COMMUNITY): Payer: Medicaid Other

## 2019-04-26 DIAGNOSIS — J9601 Acute respiratory failure with hypoxia: Secondary | ICD-10-CM

## 2019-04-26 LAB — BASIC METABOLIC PANEL
Anion gap: 8 (ref 5–15)
BUN: 8 mg/dL (ref 6–20)
CO2: 29 mmol/L (ref 22–32)
Calcium: 7.4 mg/dL — ABNORMAL LOW (ref 8.9–10.3)
Chloride: 97 mmol/L — ABNORMAL LOW (ref 98–111)
Creatinine, Ser: 0.32 mg/dL — ABNORMAL LOW (ref 0.61–1.24)
GFR calc Af Amer: >60 mL/min (ref >60)
GFR calc non Af Amer: >60 mL/min (ref >60)
Glucose, Bld: 93 mg/dL (ref 70–99)
Potassium: 3.3 mmol/L — ABNORMAL LOW (ref 3.5–5.1)
Sodium: 134 mmol/L — ABNORMAL LOW (ref 135–145)

## 2019-04-26 LAB — CBC
HCT: 25.9 % — ABNORMAL LOW (ref 39.0–52.0)
Hemoglobin: 8.1 g/dL — ABNORMAL LOW (ref 13.0–17.0)
MCH: 26.8 pg (ref 26.0–34.0)
MCHC: 31.3 g/dL (ref 30.0–36.0)
MCV: 85.8 fL (ref 80.0–100.0)
Platelets: 208 10*3/uL (ref 150–400)
RBC: 3.02 MIL/uL — ABNORMAL LOW (ref 4.22–5.81)
RDW: 20.3 % — ABNORMAL HIGH (ref 11.5–15.5)
WBC: 13.5 10*3/uL — ABNORMAL HIGH (ref 4.0–10.5)
nRBC: 0.1 % (ref 0.0–0.2)

## 2019-04-26 LAB — MAGNESIUM: Magnesium: 1.7 mg/dL (ref 1.7–2.4)

## 2019-04-26 MED ORDER — POTASSIUM CHLORIDE 20 MEQ PO PACK
40.0000 meq | PACK | ORAL | Status: AC
  Administered 2019-04-26 (×2): 40 meq via ORAL
  Filled 2019-04-26 (×2): qty 2

## 2019-04-26 MED ORDER — MAGNESIUM SULFATE IN D5W 1-5 GM/100ML-% IV SOLN
1.0000 g | Freq: Once | INTRAVENOUS | Status: AC
  Administered 2019-04-26: 1 g via INTRAVENOUS
  Filled 2019-04-26: qty 100

## 2019-04-26 MED ORDER — METOPROLOL TARTRATE 25 MG PO TABS
25.0000 mg | ORAL_TABLET | Freq: Two times a day (BID) | ORAL | Status: DC
  Administered 2019-04-26 – 2019-05-02 (×13): 25 mg via ORAL
  Filled 2019-04-26 (×13): qty 1

## 2019-04-26 MED ORDER — STERILE WATER FOR INJECTION IJ SOLN
5.0000 mg | Freq: Once | RESPIRATORY_TRACT | Status: AC
  Administered 2019-04-26: 5 mg via INTRAPLEURAL
  Filled 2019-04-26: qty 5

## 2019-04-26 MED ORDER — SODIUM CHLORIDE (PF) 0.9 % IJ SOLN
10.0000 mg | Freq: Once | INTRAMUSCULAR | Status: AC
  Administered 2019-04-26: 10 mg via INTRAPLEURAL
  Filled 2019-04-26: qty 10

## 2019-04-26 MED ORDER — HYDROMORPHONE HCL 1 MG/ML IJ SOLN
0.5000 mg | INTRAMUSCULAR | Status: DC | PRN
  Administered 2019-04-26 – 2019-04-27 (×3): 0.5 mg via INTRAVENOUS
  Filled 2019-04-26 (×3): qty 0.5

## 2019-04-26 NOTE — Progress Notes (Signed)
Right chest tube flushed with TPA, we allowed this to dwell for 1 hour followed by Pulmozyme flush again allowed to dwell for 1 hour  Following this the chest tube was placed back to drainage, we will follow-up pleural output and chest x-ray in the morning and will need to repeat TPA and [place] ACNP-BC [place] Pager # [phone] OR # [phone] if no answer

## 2019-04-26 NOTE — Progress Notes (Signed)
[facility]  PROGRESS NOTE  Tyler [name] SWF:[id number] DOB: 05/28/1961 DOA: 04/19/2019 PCP: Brand Males, MD  Brief History    Tyler [name] is a 58 y.o. year old male with medical history significant for recently diagnosed stage III lung cancer with C7/2020, completed chemotherapy cycles), recent PE/bilateral DVT on Lovenox (diagnosed 9/25), who initially presented to ED on 04/19/2019 with worsening right-sided chest pain and due to no acute findings on  CXR sent home with perocet. He returned later that same day with  Confusion, fever and shortness of breath and admitted for sepsis secondary to pneumonia in setting of malignancy.  Hospital Course:  CT chest showing loculated right pleural effusion concern for parapneumonic effusion given neutrophilic exudate and low glucose in diagnostic studies (therapeutic/diagnostic thoracentesis on 10/9, 1.2 L removed, cultures negative).  Case was discussed with thoracic surgery (10/10) who felt patient was medicated for thoracotomy.  Case was discussed with IR for possible Pleurx catheter but given loculated findings recommended pulmonary consultation.  Patient underwent IR guided chest tube  A & P     Atrial fibrillation, now rate controlled.  Briefly required IV diltiazem last 24 hours.  Transition to oral metoprolol 25 mg twice daily, closely monitor.  TSH within normal limit.  Goal K greater than 4, magnesium greater than 2, check TSH, EKG as needed chest pain.    Loculated right pleural effusion, concern for parapneumonic effusion.  Patient underwent IR guided chest tube on 10/14, TPA DNase administered by pulmonology on 10/18, serial chest x-ray and monitoring of drain output.     Pneumonia, most likely postobstructive.  Stable on 4 L O2, no worsening respiratory effort.  Continue Unasyn, day 6, remains afebrile, WBC slightly up to 12. To 13 likely stress leukocytosis related to recent chest procedure, blood cultures  unremarkable   Pulmonary embolism/bilateral DVTs, malignancy related.  Lovenox, appreciate pharmacology help   Acute on chronic hypoxic respiratory failure, stable.  Currently on 4 L has been stable for the past 24 hours.  Multifactorial etiology due to above.  See if able to wean O2 with chest tube placement, continue antibiotics.   Hypokalemia/hypomagnesemia.  Mag potassium persistently low, will correct especially in setting of A. fib .  Repeat in a.m.   Stage III non-small lung cancer, diagnosed 01/2019.  Patient has evidence of disease progression despite chemoradiation treatment. Dr. [name] made aware of patient's status by admitting physician   COPD.  No wheezing.  No worsening respiratory effort.  Continue Incruse.     Reflux, stable.  Continue Carafate   Malnutrition, moderate, likely in the setting of malignancy.  Dietary consulted, following nutritional supplementation recommendations.      DVT prophylaxis: Lovenox Code Status: Full Family Communication: No family at bedside Disposition Plan: Monitor chest tube drainage action TPA and DNase, repeat chest x-ray in a.m., monitor heart rate, magnesium/potassium correction,      [facility] Direct contact: see [email or web address] (further directions at bottom of note if needed) 7PM-7AM contact night coverage as at bottom of note 04/26/2019, 6:17 PM  LOS: 7 days   Consultants   IR, pulmonology  Procedures   IR guided chest tube placement ((right-sided), 10/14  Therapeutic/diagnostic thoracentesis 10/9  Antibiotics   Unasyn 10/8--  Cefepime 10/8  Interval History/Subjective  Breathing is much better -Chest wall pain after DNase/TPA administration, better control with IV Dilaudid  Objective   Vitals:  Vitals:   04/26/19 0606 04/26/19 1301  BP: 102/67 117/80  Pulse: 89 75  Resp:  16 18  Temp: (!) 97.4 F (36.3 C) (!) 97.4 F (36.3 C)  SpO2: 96% 100%    Exam:  Awake Alert, oriented,  frail/cachectic African-American male, no distress No JVD Symmetrical Chest wall movement, diminished breath sounds throughout, normal respiratory effort on 4 L O2, no increased work of breathing Chest tube in place Irregularly irregular rhythm, tachycardic  Abd Soft, No tenderness,  No edema,   I have personally reviewed the following:   Data Reviewed: Basic Metabolic Panel: Recent Labs  Lab 04/21/19 0636 04/22/19 0552 04/23/19 0530 04/25/19 0646 04/26/19 0531  NA 135 135 136 137 134*  K 3.5 3.5 3.6 3.1* 3.3*  CL 98 97* 97* 99 97*  CO2 27 27 26 30 29   GLUCOSE 123* 94 90 104* 93  BUN 11 11 11 8 8   CREATININE 0.46* 0.54* 0.47* 0.41* 0.32*  CALCIUM 7.9* 7.9* 8.2* 7.6* 7.4*  MG  --   --   --  1.5* 1.7   Liver Function Tests: Recent Labs  Lab 04/20/19 0522 04/21/19 0636  AST 20 16  ALT 12 12  ALKPHOS 52 68  BILITOT 0.9 0.5  PROT 5.6* 5.7*  ALBUMIN 1.9* 1.8*   No results for input(s): LIPASE, AMYLASE in the last 168 hours. No results for input(s): AMMONIA in the last 168 hours. CBC: Recent Labs  Lab 04/21/19 0636 04/22/19 0552 04/23/19 0530 04/25/19 0646 04/26/19 0531  WBC 17.0* 17.4* 19.0* 12.9* 13.5*  NEUTROABS 15.1* 14.9* 15.8* 10.6*  --   HGB 8.0* 8.7* 9.2* 7.7* 8.1*  HCT 25.5* 27.5* 28.6* 24.4* 25.9*  MCV 88.5 86.5 83.6 85.9 85.8  PLT 232 258 254 211 208   Cardiac Enzymes: No results for input(s): CKTOTAL, CKMB, CKMBINDEX, TROPONINI in the last 168 hours. BNP (last 3 results) Recent Labs    04/06/19 2217 04/19/19 1242  BNP 251.3* 136.6*    ProBNP (last 3 results) No results for input(s): PROBNP in the last 8760 hours.  CBG: No results for input(s): GLUCAP in the last 168 hours.  Recent Results (from the past 240 hour(s))  Blood Culture (routine x 2)     Status: Abnormal   Collection Time: 04/19/19 12:42 PM   Specimen: BLOOD RIGHT FOREARM  Result Value Ref Range Status   Specimen Description   Final    BLOOD RIGHT FOREARM Performed at  [name] [address] [place], [place] [address]    Special Requests   Final    BOTTLES DRAWN AEROBIC AND ANAEROBIC Blood Culture adequate volume Performed at [facility] [address], [place], [place] [address]    Culture  Setup Time   Final    GRAM POSITIVE COCCI AEROBIC BOTTLE ONLY CRITICAL RESULT CALLED TO, READ BACK BY AND VERIFIED WITH: Melodye Ped PharmD 10:35 04/20/19 ([email or web address])    Culture (A)  Final    STAPHYLOCOCCUS SPECIES (COAGULASE NEGATIVE) THE SIGNIFICANCE OF ISOLATING THIS ORGANISM FROM A SINGLE SET OF BLOOD CULTURES WHEN MULTIPLE SETS ARE DRAWN IS UNCERTAIN. PLEASE NOTIFY THE MICROBIOLOGY DEPARTMENT WITHIN ONE WEEK IF SPECIATION AND SENSITIVITIES ARE REQUIRED. Performed at [facility], [place] [address], [place], [place] [address]    Report Status 04/21/2019 FINAL  Final  Blood Culture ID Panel (Reflexed)     Status: Abnormal   Collection Time: 04/19/19 12:42 PM  Result Value Ref Range Status   Enterococcus species NOT DETECTED NOT DETECTED Final   Listeria monocytogenes NOT DETECTED NOT DETECTED Final   Staphylococcus  species DETECTED (A) NOT DETECTED Final    Comment: Methicillin (oxacillin) susceptible coagulase negative staphylococcus. Possible blood culture contaminant (unless isolated from more than one blood culture draw or clinical case suggests pathogenicity). No antibiotic treatment is indicated for blood  culture contaminants. CRITICAL RESULT CALLED TO, READ BACK BY AND VERIFIED WITH: Melodye Ped PharmD 10:35 04/20/19 ([email or web address])    Staphylococcus aureus (BCID) NOT DETECTED NOT DETECTED Final   Methicillin resistance NOT DETECTED NOT DETECTED Final   Streptococcus species NOT DETECTED NOT DETECTED Final   Streptococcus agalactiae NOT DETECTED NOT DETECTED Final   Streptococcus pneumoniae NOT DETECTED NOT DETECTED Final   Streptococcus pyogenes NOT DETECTED NOT DETECTED Final   Acinetobacter baumannii NOT  DETECTED NOT DETECTED Final   Enterobacteriaceae species NOT DETECTED NOT DETECTED Final   Enterobacter cloacae complex NOT DETECTED NOT DETECTED Final   Escherichia coli NOT DETECTED NOT DETECTED Final   Klebsiella oxytoca NOT DETECTED NOT DETECTED Final   Klebsiella pneumoniae NOT DETECTED NOT DETECTED Final   Proteus species NOT DETECTED NOT DETECTED Final   Serratia marcescens NOT DETECTED NOT DETECTED Final   Haemophilus influenzae NOT DETECTED NOT DETECTED Final   Neisseria meningitidis NOT DETECTED NOT DETECTED Final   Pseudomonas aeruginosa NOT DETECTED NOT DETECTED Final   Candida albicans NOT DETECTED NOT DETECTED Final   Candida glabrata NOT DETECTED NOT DETECTED Final   Candida krusei NOT DETECTED NOT DETECTED Final   Candida parapsilosis NOT DETECTED NOT DETECTED Final   Candida tropicalis NOT DETECTED NOT DETECTED Final    Comment: Performed at [facility], [address] [place], [place] [address]  SARS Coronavirus 2 by RT PCR (hospital order, performed in [facility] hospital lab) Nasopharyngeal Nasopharyngeal Swab     Status: None   Collection Time: 04/19/19  2:13 PM   Specimen: Nasopharyngeal Swab  Result Value Ref Range Status   SARS Coronavirus 2 NEGATIVE NEGATIVE Final    Comment: (NOTE) If result is NEGATIVE SARS-CoV-2 target nucleic acids are NOT DETECTED. The SARS-CoV-2 RNA is generally detectable in upper and lower  respiratory specimens during the acute phase of infection. The lowest  concentration of SARS-CoV-2 viral copies this assay can detect is 250  copies / mL. A negative result does not preclude SARS-CoV-2 infection  and should not be used as the sole basis for treatment or other  patient management decisions.  A negative result may occur with  improper specimen collection / handling, submission of specimen other  than nasopharyngeal swab, presence of viral mutation(s) within the  areas targeted by this assay, and inadequate number of viral  copies  (<250 copies / mL). A negative result must be combined with clinical  observations, patient history, and epidemiological information. If result is POSITIVE SARS-CoV-2 target nucleic acids are DETECTED. The SARS-CoV-2 RNA is generally detectable in upper and lower  respiratory specimens dur ing the acute phase of infection.  Positive  results are indicative of active infection with SARS-CoV-2.  Clinical  correlation with patient history and other diagnostic information is  necessary to determine patient infection status.  Positive results do  not rule out bacterial infection or co-infection with other viruses. If result is PRESUMPTIVE POSTIVE SARS-CoV-2 nucleic acids MAY BE PRESENT.   A presumptive positive result was obtained on the submitted specimen  and confirmed on repeat testing.  While 2019 novel coronavirus  (SARS-CoV-2) nucleic acids may be present in the submitted sample  additional confirmatory testing may be necessary for epidemiological  and /  or clinical management purposes  to differentiate between  SARS-CoV-2 and other Sarbecovirus currently known to infect humans.  If clinically indicated additional testing with an alternate test  methodology [phone]) is advised. The SARS-CoV-2 RNA is generally  detectable in upper and lower respiratory sp ecimens during the acute  phase of infection. The expected result is Negative. Fact Sheet for Patients:  StrictlyIdeas.no Fact Sheet for Healthcare Providers: BankingDealers.co.za This test is not yet approved or cleared by the [place] FDA and has been authorized for detection and/or diagnosis of SARS-CoV-2 by FDA under an Emergency Use Authorization (EUA).  This EUA will remain in effect (meaning this test can be used) for the duration of the COVID-19 declaration under Section 564(b)(1) of the Act, 21 U.S.C. section 360bbb-3(b)(1), unless the authorization is terminated  or revoked sooner. Performed at [facility], [place] [address], [place], [place] [address]   Culture, blood (Routine X 2) w Reflex to ID Panel     Status: None   Collection Time: 04/19/19  3:00 PM   Specimen: BLOOD  Result Value Ref Range Status   Specimen Description   Final    BLOOD RIGHT HAND Performed at [facility] [address], [place], [place] [address]    Special Requests   Final    BOTTLES DRAWN AEROBIC AND ANAEROBIC Blood Culture results may not be optimal due to an inadequate volume of blood received in culture bottles Performed at [facility] [address], [place], [place] [address]    Culture   Final    NO GROWTH 5 DAYS Performed at [facility], [place] [address], [place], [place] [address]    Report Status 04/24/2019 FINAL  Final  Urine culture     Status: None   Collection Time: 04/19/19  3:13 PM   Specimen: In/Out Cath Urine  Result Value Ref Range Status   Specimen Description   Final    IN/OUT CATH URINE Performed at [facility] [address], [place], [place] [address]    Special Requests   Final    NONE Performed at [facility], [place] [address], [place], [place] [address]    Culture   Final    Multiple bacterial morphotypes present, none predominant. Suggest appropriate recollection if clinically indicated.   Report Status 04/20/2019 FINAL  Final  Culture, sputum-assessment     Status: None   Collection Time: 04/20/19 10:38 AM   Specimen: Sputum  Result Value Ref Range Status   Specimen Description SPUTUM  Final   Special Requests NONE  Final   Sputum evaluation   Final    THIS SPECIMEN IS ACCEPTABLE FOR SPUTUM CULTURE Performed at [facility], [place] [address], [place], [place] [address]    Report Status 04/20/2019 FINAL  Final  Culture, respiratory     Status: None   Collection Time: 04/20/19 10:38 AM   Specimen: SPU   Result Value Ref Range Status   Specimen Description   Final    SPUTUM Performed at [facility] [address] [place],  [address]    Special Requests   Final    NONE Reflexed from [id number] Performed at [facility], [place] [address] [place], [place] [address]    Gram Stain   Final    RARE WBC PRESENT,BOTH PMN AND MONONUCLEAR RARE SQUAMOUS EPITHELIAL CELLS PRESENT FEW GRAM POSITIVE RODS RARE YEAST RARE GRAM NEGATIVE RODS FEW  GRAM POSITIVE COCCI    Culture   Final    FEW Consistent with normal respiratory flora. Performed at [facility], [place] [address], [place], [place] [address]    Report Status 04/22/2019 FINAL  Final  Culture, body fluid-bottle     Status: None   Collection Time: 04/20/19  3:39 PM   Specimen: Fluid  Result Value Ref Range Status   Specimen Description FLUID PLEURAL  Final   Special Requests   Final    BOTTLES DRAWN AEROBIC AND ANAEROBIC Blood Culture adequate volume   Culture   Final    NO GROWTH Performed at [facility], [address] [place], [place] [address]    Report Status 04/23/2019 FINAL  Final  Gram stain     Status: None   Collection Time: 04/20/19  3:39 PM   Specimen: Fluid  Result Value Ref Range Status   Specimen Description FLUID PLEURAL  Final   Special Requests NONE  Final   Gram Stain   Final    RARE WBC PRESENT, PREDOMINANTLY PMN NO ORGANISMS SEEN Performed at [facility], [place] [address] [place], [place] [address]    Report Status 04/21/2019 FINAL  Final     Studies: Dg Chest Port 1 View  Result Date: 04/26/2019 CLINICAL DATA:  Pleural effusion EXAM: PORTABLE CHEST 1 VIEW COMPARISON:  04/24/2019 FINDINGS: Right basilar pleural chest tube in place with decreasing right effusion. Moderate right effusion remains. SPECT small right apical pneumothorax. Right base atelectasis or infiltrate. Diffuse interstitial prominence throughout the lungs. Heart is normal size.  IMPRESSION: Decreasing right effusion following chest tube placement. Suspect small right apical pneumothorax with continued moderate right pleural effusion. Right basilar atelectasis or infiltrate. Stable diffuse interstitial prominence throughout the lungs. Electronically Signed   By: Rolm Baptise M.D.   On: 04/26/2019 09:51   Ct Perc Pleural Drain W/indwell Cath W/img Guide  Result Date: 04/25/2019 CLINICAL DATA:  Lung carcinoma with loculated right pleural effusion, shortness of breath. EXAM: CT GUIDED CHEST DRAIN PLACEMENT ANESTHESIA/SEDATION: Intravenous Fentanyl 39mcg and Versed 1mg  were administered as conscious sedation during continuous monitoring of the patient's level of consciousness and physiological / cardiorespiratory status by the radiology RN, with a total moderate sedation time of 26 minutes. PROCEDURE: The procedure, risks, benefits, and alternatives were explained to the patient. Questions regarding the procedure were encouraged and answered. The patient understands and consents to the procedure. Patient placed in left lateral decubitus position. Select axial scans through the thorax performed in the loculated right pleural effusion was localized. An appropriate skin entry site was determined and marked. The operative field was prepped with chlorhexidinein a sterile fashion, and a sterile drape was applied covering the operative field. A sterile gown and sterile gloves were used for the procedure. Local anesthesia was provided with 1% Lidocaine. Percutaneous entry needle advanced into the right pleural space. Fluid easily aspirated. Amplatz wire advanced easily, its position confirmed on CT fluoroscopy. Tract dilated to facilitate placement of a 14 French pigtail drain catheter, placed within the posterior dependent aspect of the collection. Catheter position confirmed on CT. Catheter was secured externally with 0 Prolene suture and StatLock and placed [place]. The patient  tolerated the procedure well. COMPLICATIONS: None immediate FINDINGS: Loculated right pleural effusion was localized. 14 French pigtail drain catheter placed as above. IMPRESSION: 1. Technically successful CT-guided placement of right chest drain catheter. Electronically Signed   By: Lucrezia Europe M.D.   On: 04/25/2019  15:23    Scheduled Meds:  enoxaparin  90 mg Subcutaneous Q24H   feeding supplement  1 Container Oral BID BM   magnesium oxide  400 mg Oral BID   mouth rinse  15 mL Mouth Rinse BID   metoprolol tartrate  25 mg Oral BID   multivitamin with minerals  1 tablet Oral Daily   polyethylene glycol  17 g Oral Daily   potassium chloride SA  20 mEq Oral Daily   sodium chloride flush  5 mL Intracatheter Q8H   sucralfate  1 g Oral TID WC & HS   umeclidinium bromide  1 puff Inhalation Daily   Continuous Infusions:  sodium chloride 10 mL/hr at 04/23/19 2355   ampicillin-sulbactam (UNASYN) IV 3 g (04/26/19 1403)    Principal Problem:   Aspiration pneumonia (HCC) Active Problems:   Acute respiratory failure with hypoxia (HCC)   Protein-calorie malnutrition, severe   Pulmonary embolism (HCC)   Pleural effusion, right   PNA (pneumonia)   Malnutrition of moderate degree   Atrial fibrillation with RVR ([place])      Desiree Hane  [facility]

## 2019-04-26 NOTE — Progress Notes (Signed)
NAME:  Tyler [name], MRN:  [id number], DOB:  May 08, 1961, LOS: 7 ADMISSION DATE:  04/19/2019, CONSULTATION DATE:  10/13 REFERRING MD:  [email or web address], CHIEF COMPLAINT:  Recurrent and loculated pleural effusion    Brief History                                                                                                                                         58 year old male patient with history of stage III lung cancer currently on chemotherapy, status post 30 cycles of XRT admitted on 10/8 with a working diagnosis of postobstructive pneumonia and parapneumonic right pleural effusion.  Pulmonary asked to evaluate on 10/13 for consideration of possible chest tube and administration of TPA/Pulmozyme                                                                                                                                                                                                                                                 History of present illness   58 year old black male with significant medical history as listed below presented to the emergency room on 10/8 with chief complaint of chest pain, involving the right chest worsening Malays and worsening shortness of breath.  Apparently chest x-ray was unremarkable so he was released to home with low-dose Percocet.  He returned to the emergency room later that evening with a temperature of 103.5, CT scan was obtained worrisome for pneumonia and worsening moderate size right pleural effusion.  He was given IV antibiotics, fluid resuscitation and supplemental oxygen.  He underwent therapeutic and diagnostic thoracentesis on 10/9 This yielded 1.2 L of hazy yellow fluid, this was exudative by light's criteria, but cultures were negative, I do not see cytology.  He was being treated for working  diagnosis of postobstructive pneumonia and parapneumonic pleural effusion.  He had actually had a pleural effusion tapped back on 9/26 as well at that time cytology  was negative.  Thoracic surgery was called, they did not feel he was a candidate for thoracotomy, interventional radiology was called for possible Pleurx tube however they recommended pulmonary evaluation given concern about loculated pleural findings and felt possible traditional chest tube placement and consideration for TPA and Pulmozyme might be more appropriate because of this pulmonary was asked to evaluate.  Past Medical History  Stage III lung cancer diagnosed July 2020 last chemotherapy received 8/24 carboplatin/paclitaxel (s/p 5 cycles) He is status post 30 fractions of XRT. Most recent hospital admit 9/25 for acute pulmonary emboli with bilateral lower extremity DVTs, placed on Lovenox History of EtOH abuse History of tobacco abuse  Significant Hospital Events   10/8: Admitted with working diagnosis of postobstructive pneumonia and pleural effusion 10/9: Therapeutic and diagnostic thoracentesis performed on the right side yielding 1.2 L of pleural fluid, fluid evaluation was consistent with exudate cytology not sent Cultures negative.  Case discussed with thoracic surgery felt not a candidate for thoracotomy 10/12: Interventional radiology contacted for evaluation of possible Pleurx catheter They felt they felt given loculated findings might be better served with chest tube  10/13: Pulmonary asked to evaluate Consults:  Thoracic surgery consulted over phone by internal medicine 10/10 deemed not a thoracic surgery candidate Interventional radiology consulted 10/12 Pulmonary: Consulted 10/13  Procedures:  9/26 thora RT >> neutrophilic transudate Right pleural effusion thoracentesis 10/9:  1.2 L of turbid neutrophilic exudate with low glucose  10/14 CT guided RT perc chest drain  Significant Diagnostic Tests:  CT chest 10/8:Increased right pleural effusion is noted with associated right basilar atelectasis or infiltrate. Stable left midlung and basilar opacity as described  above.  Micro Data:   Pleural fluid 10/9: Negative Blood cultures 10/8: 1 out of 2 coag negative staph Sputum culture 10/9: Normal flora  Antimicrobials:  Unasyn 10/8  Interim history/subjective:  Complains of improved dyspnea Denies chest pain Afebrile   Objective   Blood pressure 102/67, pulse 89, temperature (!) 97.4 F (36.3 C), temperature source Oral, resp. rate 16, height 6' 2.02" (1.88 m), weight 59 kg, SpO2 96 %.        Intake/Output Summary (Last 24 hours) at 04/26/2019 1058 Last data filed at 04/26/2019 0727 Gross per 24 hour  Intake 661.33 ml  Output 1400 ml  Net -738.67 ml   Filed Weights   04/19/19 2321  Weight: 59 kg    Examination: General: Well-built middle-age man, no distress HENT: NCAT no JVD  temporal wasting  Lungs: decreased on right. No accessory use , no air leak on chest tube Cardiovascular: RRR w/out MRG  Abdomen: soft not tender  Extremities: brisk CR warm good pulses  Neuro: awake and oriented no focal def    Chest x-ray 10/15 personally reviewed which shows moderate right effusion, improved from before  Resolved Hospital Problem list     Assessment & Plan:  Stage III recurrent lung cancer Probable post obstructive PNA Recurrent exudative effusion (now loculated) Cancer related malnutrition   Discussion -effusion has decreased but loculation remains  Plan Proceed with instillation of  TPA and DNase -  Cont abx  Acute PE/DVT-continue Lovenox   Kara Mead MD. FCCP. [facility]  If no response to pager , please call [phone]   04/26/2019

## 2019-04-26 NOTE — Progress Notes (Signed)
Referring Physician(s): Rigoberto Noel  Supervising Physician: Aletta Edouard  Patient Status:  [facility] - In-pt  Chief Complaint: "Short of breath when I move" and "pain at tube"  Subjective:  Patient with history of lung carcinoma with secondary recurrent loculated right pleural effusion s/p right chest tube placement in IR 04/25/2019 by Dr. Vernard Gambles. Patient awake and alert sitting in bed eating breakfast and watching TV. Complains of dyspnea on exertion. Complains of tenderness at chest tube incision site, as expected. Sating 96-100% on 5L [place].  CXR this AM: 1. Decreasing right effusion following chest tube placement. Suspect small right apical pneumothorax with continued moderate right pleural effusion. 2. Right basilar atelectasis or infiltrate. 3. Stable diffuse interstitial prominence throughout the lungs.   Allergies: Patient has no known allergies.  Medications: Prior to Admission medications   Medication Sig Start Date End Date Taking? Authorizing Provider  albuterol (PROVENTIL HFA;VENTOLIN HFA) 108 (90 Base) MCG/ACT inhaler Inhale 2 puffs into the lungs every 6 (six) hours as needed for wheezing or shortness of breath. 06/27/18  Yes Icard, Bradley L, DO  enoxaparin (LOVENOX) 100 MG/ML injection Inject 0.9 mLs (90 mg total) into the skin daily. 04/11/19 05/11/19 Yes GhimireDante Gang, MD  feeding supplement, ENSURE ENLIVE, (ENSURE ENLIVE) LIQD Take 237 mLs by mouth 3 (three) times daily between meals. Patient taking differently: Take 237 mLs by mouth daily.  08/11/17  Yes Ghimire, Henreitta Leber, MD  ibuprofen (ADVIL) 200 MG tablet Take 400 mg by mouth every 6 (six) hours as needed for fever, headache or moderate pain.   Yes [provider]  Magnesium Oxide 400 MG CAPS Take 1 capsule (400 mg total) by mouth 2 (two) times daily. 04/10/19 05/10/19 Yes Ghimire, Dante Gang, MD  potassium chloride SA (K-DUR) 20 MEQ tablet Take 1 tablet (20 mEq total) by mouth daily. 04/06/19  Yes  Tish Men, MD  sucralfate (CARAFATE) 1 g tablet Take 1 g by mouth 4 (four) times daily -  with meals and at bedtime.   Yes [provider]  tiotropium (SPIRIVA HANDIHALER) 18 MCG inhalation capsule Place 1 capsule (18 mcg total) into inhaler and inhale daily. 06/27/18 01/05/20 Yes Icard, Octavio Graves, DO  lidocaine (XYLOCAINE) 2 % solution 5 ml swallow before meals for radiation esophagitis Patient taking differently: 5 ml swallow before meals for radiation esophagitis as needed to numb 03/30/19   [name], MD  oxyCODONE-acetaminophen (PERCOCET) 5-325 MG tablet Take 1-2 tablets by mouth every 4 (four) hours as needed for moderate pain or severe pain. 04/19/19   Orpah Greek, MD     Vital Signs: BP 102/67 (BP Location: Left Arm)   Pulse 89   Temp (!) 97.4 F (36.3 C) (Oral)   Resp 16   Ht 6' 2.02" (1.88 m)   Wt 130 lb (59 kg)   SpO2 96%   BMI 16.68 kg/m   Physical Exam Vitals signs and nursing note reviewed.  Constitutional:      General: He is not in acute distress.    Appearance: Normal appearance.  Pulmonary:     Effort: Pulmonary effort is normal. No respiratory distress.     Comments: Right chest tube incision site with mild tenderness, no erythema, drainage, or active bleeding; approximately 650 cc of clear yellow fluid in Pleure-Vac; tube to suction with (-) air leak. Skin:    General: Skin is warm and dry.  Neurological:     Mental Status: He is alert and oriented to person, place, and  time.  Psychiatric:        Mood and Affect: Mood normal.        Behavior: Behavior normal.        Thought Content: Thought content normal.        Judgment: Judgment normal.     Imaging: Dg Chest 2 View  Result Date: 04/24/2019 CLINICAL DATA:  58 year old male with history of lung cancer status post chemotherapy. EXAM: CHEST - 2 VIEW COMPARISON:  Chest x-ray 04/21/2019. FINDINGS: Large right pleural effusion with extensive atelectasis and/or consolidation in the  base of the right lung. Small left pleural effusion. Patchy multifocal interstitial opacities widespread throughout the lungs bilaterally, with relative sparing of the apex of the left upper lobe. Pulmonary vasculature does not appearing origin. Heart size is normal. Upper mediastinal contours are within normal limits. IMPRESSION: 1. Patchy multifocal interstitial and airspace disease in the lungs bilaterally, asymmetrically distributed, favored to reflect a multilobar pneumonia. 2. Large right pleural effusion with areas of atelectasis and/or consolidation in the right lung base. Electronically Signed   By: Vinnie Langton M.D.   On: 04/24/2019 17:02   Dg Chest Port 1 View  Result Date: 04/26/2019 CLINICAL DATA:  Pleural effusion EXAM: PORTABLE CHEST 1 VIEW COMPARISON:  04/24/2019 FINDINGS: Right basilar pleural chest tube in place with decreasing right effusion. Moderate right effusion remains. SPECT small right apical pneumothorax. Right base atelectasis or infiltrate. Diffuse interstitial prominence throughout the lungs. Heart is normal size. IMPRESSION: Decreasing right effusion following chest tube placement. Suspect small right apical pneumothorax with continued moderate right pleural effusion. Right basilar atelectasis or infiltrate. Stable diffuse interstitial prominence throughout the lungs. Electronically Signed   By: Rolm Baptise M.D.   On: 04/26/2019 09:51   Ct Perc Pleural Drain W/indwell Cath W/img Guide  Result Date: 04/25/2019 CLINICAL DATA:  Lung carcinoma with loculated right pleural effusion, shortness of breath. EXAM: CT GUIDED CHEST DRAIN PLACEMENT ANESTHESIA/SEDATION: Intravenous Fentanyl 33mcg and Versed 1mg  were administered as conscious sedation during continuous monitoring of the patient's level of consciousness and physiological / cardiorespiratory status by the radiology RN, with a total moderate sedation time of 26 minutes. PROCEDURE: The procedure, risks, benefits, and  alternatives were explained to the patient. Questions regarding the procedure were encouraged and answered. The patient understands and consents to the procedure. Patient placed in left lateral decubitus position. Select axial scans through the thorax performed in the loculated right pleural effusion was localized. An appropriate skin entry site was determined and marked. The operative field was prepped with chlorhexidinein a sterile fashion, and a sterile drape was applied covering the operative field. A sterile gown and sterile gloves were used for the procedure. Local anesthesia was provided with 1% Lidocaine. Percutaneous entry needle advanced into the right pleural space. Fluid easily aspirated. Amplatz wire advanced easily, its position confirmed on CT fluoroscopy. Tract dilated to facilitate placement of a 14 French pigtail drain catheter, placed within the posterior dependent aspect of the collection. Catheter position confirmed on CT. Catheter was secured externally with 0 Prolene suture and StatLock and placed [place]. The patient tolerated the procedure well. COMPLICATIONS: None immediate FINDINGS: Loculated right pleural effusion was localized. 14 French pigtail drain catheter placed as above. IMPRESSION: 1. Technically successful CT-guided placement of right chest drain catheter. Electronically Signed   By: Lucrezia Europe M.D.   On: 04/25/2019 15:23    Labs:  CBC: Recent Labs    04/22/19 0552 04/23/19 0530 04/25/19 0646 04/26/19 0531  WBC 17.4*  19.0* 12.9* 13.5*  HGB 8.7* 9.2* 7.7* 8.1*  HCT 27.5* 28.6* 24.4* 25.9*  PLT 258 254 211 208    COAGS: Recent Labs    01/11/19 1249 04/06/19 2217 04/19/19 1242 04/25/19 0646  INR 1.1 1.2 1.2 1.1  APTT 31 34 26  --     BMP: Recent Labs    04/22/19 0552 04/23/19 0530 04/25/19 0646 04/26/19 0531  NA 135 136 137 134*  K 3.5 3.6 3.1* 3.3*  CL 97* 97* 99 97*  CO2 27 26 30 29   GLUCOSE 94 90 104* 93  BUN 11 11 8 8   CALCIUM  7.9* 8.2* 7.6* 7.4*  CREATININE 0.54* 0.47* 0.41* 0.32*  GFRNONAA >60 >60 >60 >60  GFRAA >60 >60 >60 >60    LIVER FUNCTION TESTS: Recent Labs    04/06/19 1134 04/19/19 1242 04/20/19 0522 04/21/19 0636  BILITOT 0.8 0.8 0.9 0.5  AST 15 17 20 16   ALT 6 16 12 12   ALKPHOS 85 82 52 68  PROT 6.1* 6.9 5.6* 5.7*  ALBUMIN 2.2* 2.3* 1.9* 1.8*    Assessment and Plan:  Patient with history of lung carcinoma with secondary recurrent loculated right pleural effusion s/p right chest tube placement in IR 04/25/2019 by Dr. Vernard Gambles. Right chest tube stable with approximately 650 cc of clear yellow fluid in Pleure-Vac, tube to suction with (-) air leak. Continue current drain management. Further plans per TRH/CCM- appreciate and agree with management. IR to follow.   Electronically Signed: Earley Abide, PA-C 04/26/2019, 10:48 AM   I spent a total of 25 Minutes at the the patient's bedside AND on the patient's hospital floor or unit, greater than 50% of which was counseling/coordinating care for loculated right pleural effusion s/p right chest tube placement.

## 2019-04-27 ENCOUNTER — Inpatient Hospital Stay (HOSPITAL_COMMUNITY): Payer: Medicaid Other

## 2019-04-27 DIAGNOSIS — I480 Paroxysmal atrial fibrillation: Secondary | ICD-10-CM

## 2019-04-27 LAB — BASIC METABOLIC PANEL
Anion gap: 9 (ref 5–15)
BUN: 10 mg/dL (ref 6–20)
CO2: 28 mmol/L (ref 22–32)
Calcium: 8.1 mg/dL — ABNORMAL LOW (ref 8.9–10.3)
Chloride: 99 mmol/L (ref 98–111)
Creatinine, Ser: 0.47 mg/dL — ABNORMAL LOW (ref 0.61–1.24)
GFR calc Af Amer: >60 mL/min (ref >60)
GFR calc non Af Amer: >60 mL/min (ref >60)
Glucose, Bld: 108 mg/dL — ABNORMAL HIGH (ref 70–99)
Potassium: 4.1 mmol/L (ref 3.5–5.1)
Sodium: 136 mmol/L (ref 135–145)

## 2019-04-27 LAB — CBC
HCT: 25.6 % — ABNORMAL LOW (ref 39.0–52.0)
Hemoglobin: 8 g/dL — ABNORMAL LOW (ref 13.0–17.0)
MCH: 27.4 pg (ref 26.0–34.0)
MCHC: 31.3 g/dL (ref 30.0–36.0)
MCV: 87.7 fL (ref 80.0–100.0)
Platelets: 243 10*3/uL (ref 150–400)
RBC: 2.92 MIL/uL — ABNORMAL LOW (ref 4.22–5.81)
RDW: 20.1 % — ABNORMAL HIGH (ref 11.5–15.5)
WBC: 15.8 10*3/uL — ABNORMAL HIGH (ref 4.0–10.5)
nRBC: 0 % (ref 0.0–0.2)

## 2019-04-27 NOTE — Progress Notes (Signed)
Referring Physician(s): Alva,R  Supervising Physician: Jacqulynn Cadet  Patient Status:  [facility] - In-pt  Chief Complaint: Chest pain, dyspnea, lung cancer   Subjective: Patient feeling better since right chest drain placed.  Does have some tenderness at drain insertion site when coughing.   Allergies: Patient has no known allergies.  Medications: Prior to Admission medications   Medication Sig Start Date End Date Taking? Authorizing Provider  albuterol (PROVENTIL HFA;VENTOLIN HFA) 108 (90 Base) MCG/ACT inhaler Inhale 2 puffs into the lungs every 6 (six) hours as needed for wheezing or shortness of breath. 06/27/18  Yes Icard, Bradley L, DO  enoxaparin (LOVENOX) 100 MG/ML injection Inject 0.9 mLs (90 mg total) into the skin daily. 04/11/19 05/11/19 Yes GhimireDante Gang, MD  feeding supplement, ENSURE ENLIVE, (ENSURE ENLIVE) LIQD Take 237 mLs by mouth 3 (three) times daily between meals. Patient taking differently: Take 237 mLs by mouth daily.  08/11/17  Yes Ghimire, Henreitta Leber, MD  ibuprofen (ADVIL) 200 MG tablet Take 400 mg by mouth every 6 (six) hours as needed for fever, headache or moderate pain.   Yes [provider]  Magnesium Oxide 400 MG CAPS Take 1 capsule (400 mg total) by mouth 2 (two) times daily. 04/10/19 05/10/19 Yes Ghimire, Dante Gang, MD  potassium chloride SA (K-DUR) 20 MEQ tablet Take 1 tablet (20 mEq total) by mouth daily. 04/06/19  Yes Tish Men, MD  sucralfate (CARAFATE) 1 g tablet Take 1 g by mouth 4 (four) times daily -  with meals and at bedtime.   Yes [provider]  tiotropium (SPIRIVA HANDIHALER) 18 MCG inhalation capsule Place 1 capsule (18 mcg total) into inhaler and inhale daily. 06/27/18 01/05/20 Yes Icard, Octavio Graves, DO  lidocaine (XYLOCAINE) 2 % solution 5 ml swallow before meals for radiation esophagitis Patient taking differently: 5 ml swallow before meals for radiation esophagitis as needed to numb 03/30/19   [name], MD   oxyCODONE-acetaminophen (PERCOCET) 5-325 MG tablet Take 1-2 tablets by mouth every 4 (four) hours as needed for moderate pain or severe pain. 04/19/19   Orpah Greek, MD     Vital Signs: BP 104/80 (BP Location: Left Arm)   Pulse 81   Temp 98.2 F (36.8 C) (Oral)   Resp 18   Ht 6' 2.02" (1.88 m)   Wt 90 lb 6.2 oz (41 kg)   SpO2 100%   BMI 11.60 kg/m   Physical Exam awake, alert.  Right chest drain intact, insertion site okay, mildly tender, small amount of yellow fluid in Pleur-evac, latest output recorded 580 cc ;no obvious air leak.  Tube connected to wall suction.  Imaging: Dg Chest 2 View  Result Date: 04/24/2019 CLINICAL DATA:  58 year old male with history of lung cancer status post chemotherapy. EXAM: CHEST - 2 VIEW COMPARISON:  Chest x-ray 04/21/2019. FINDINGS: Large right pleural effusion with extensive atelectasis and/or consolidation in the base of the right lung. Small left pleural effusion. Patchy multifocal interstitial opacities widespread throughout the lungs bilaterally, with relative sparing of the apex of the left upper lobe. Pulmonary vasculature does not appearing origin. Heart size is normal. Upper mediastinal contours are within normal limits. IMPRESSION: 1. Patchy multifocal interstitial and airspace disease in the lungs bilaterally, asymmetrically distributed, favored to reflect a multilobar pneumonia. 2. Large right pleural effusion with areas of atelectasis and/or consolidation in the right lung base. Electronically Signed   By: Vinnie Langton M.D.   On: 04/24/2019 17:02   Dg Chest [facility]  Result Date: 04/27/2019 CLINICAL DATA:  Acute respiratory failure. EXAM: PORTABLE CHEST 1 VIEW COMPARISON:  04/26/2019 FINDINGS: Stable position of the right basilar chest tube. Improved aeration at the right lung base suggesting decreased right pleural effusion. Persistent linear and patchy densities at the right lung base. No evidence for pneumothorax. Hazy  interstitial densities in the left lung are unchanged. Heart size is within normal limits and stable. The trachea is midline. IMPRESSION: 1. Improved aeration of the right lung base compatible with decreased right pleural fluid. Right chest tube is in a stable position. 2. Negative for pneumothorax. 3. Linear and nodular right basilar densities are nonspecific. Electronically Signed   By: Markus Daft M.D.   On: 04/27/2019 09:29   Dg Chest Port 1 View  Result Date: 04/26/2019 CLINICAL DATA:  Pleural effusion EXAM: PORTABLE CHEST 1 VIEW COMPARISON:  04/24/2019 FINDINGS: Right basilar pleural chest tube in place with decreasing right effusion. Moderate right effusion remains. SPECT small right apical pneumothorax. Right base atelectasis or infiltrate. Diffuse interstitial prominence throughout the lungs. Heart is normal size. IMPRESSION: Decreasing right effusion following chest tube placement. Suspect small right apical pneumothorax with continued moderate right pleural effusion. Right basilar atelectasis or infiltrate. Stable diffuse interstitial prominence throughout the lungs. Electronically Signed   By: Rolm Baptise M.D.   On: 04/26/2019 09:51   Ct Perc Pleural Drain W/indwell Cath W/img Guide  Result Date: 04/25/2019 CLINICAL DATA:  Lung carcinoma with loculated right pleural effusion, shortness of breath. EXAM: CT GUIDED CHEST DRAIN PLACEMENT ANESTHESIA/SEDATION: Intravenous Fentanyl 57mcg and Versed 1mg  were administered as conscious sedation during continuous monitoring of the patient's level of consciousness and physiological / cardiorespiratory status by the radiology RN, with a total moderate sedation time of 26 minutes. PROCEDURE: The procedure, risks, benefits, and alternatives were explained to the patient. Questions regarding the procedure were encouraged and answered. The patient understands and consents to the procedure. Patient placed in left lateral decubitus position. Select axial scans  through the thorax performed in the loculated right pleural effusion was localized. An appropriate skin entry site was determined and marked. The operative field was prepped with chlorhexidinein a sterile fashion, and a sterile drape was applied covering the operative field. A sterile gown and sterile gloves were used for the procedure. Local anesthesia was provided with 1% Lidocaine. Percutaneous entry needle advanced into the right pleural space. Fluid easily aspirated. Amplatz wire advanced easily, its position confirmed on CT fluoroscopy. Tract dilated to facilitate placement of a 14 French pigtail drain catheter, placed within the posterior dependent aspect of the collection. Catheter position confirmed on CT. Catheter was secured externally with 0 Prolene suture and StatLock and placed [place]. The patient tolerated the procedure well. COMPLICATIONS: None immediate FINDINGS: Loculated right pleural effusion was localized. 14 French pigtail drain catheter placed as above. IMPRESSION: 1. Technically successful CT-guided placement of right chest drain catheter. Electronically Signed   By: Lucrezia Europe M.D.   On: 04/25/2019 15:23    Labs:  CBC: Recent Labs    04/23/19 0530 04/25/19 0646 04/26/19 0531 04/27/19 0533  WBC 19.0* 12.9* 13.5* 15.8*  HGB 9.2* 7.7* 8.1* 8.0*  HCT 28.6* 24.4* 25.9* 25.6*  PLT 254 211 208 243    COAGS: Recent Labs    01/11/19 1249 04/06/19 2217 04/19/19 1242 04/25/19 0646  INR 1.1 1.2 1.2 1.1  APTT 31 34 26  --     BMP: Recent Labs    04/23/19 0530 04/25/19 0646 04/26/19 0531 04/27/19  0533  NA 136 137 134* 136  K 3.6 3.1* 3.3* 4.1  CL 97* 99 97* 99  CO2 26 30 29 28   GLUCOSE 90 104* 93 108*  BUN 11 8 8 10   CALCIUM 8.2* 7.6* 7.4* 8.1*  CREATININE 0.47* 0.41* 0.32* 0.47*  GFRNONAA >60 >60 >60 >60  GFRAA >60 >60 >60 >60    LIVER FUNCTION TESTS: Recent Labs    04/06/19 1134 04/19/19 1242 04/20/19 0522 04/21/19 0636  BILITOT 0.8 0.8  0.9 0.5  AST 15 17 20 16   ALT 6 16 12 12   ALKPHOS 85 82 52 68  PROT 6.1* 6.9 5.6* 5.7*  ALBUMIN 2.2* 2.3* 1.9* 1.8*    Assessment and Plan: Patient with history of lung cancer, loculated right pleural effusion, prior PE/bilateral DVT, pleural fluid cultures negative, status post right chest drain placement on 04/25/2019; patient status post instillation of TPA and DNase into chest drain by CCM; afebrile, creatinine 0.47, WBC 15.8 up slightly from 13.5, hemoglobin 8, previously 8.1, chest x-ray today shows improved aeration of right lung base with decreased pleural fluid, right chest tube in stable position, no pneumothorax.  Plans as noted per CCM, possible tube removal on 10/17.   Electronically Signed: D. Rowe Robert, PA-C 04/27/2019, 2:16 PM   I spent a total of 15 minutes at the the patient's bedside AND on the patient's hospital floor or unit, greater than 50% of which was counseling/coordinating care for right chest drain    Patient ID: Tyler [name], male   DOB: [birth year], 58 y.o.   MRN: [id number]

## 2019-04-27 NOTE — Progress Notes (Signed)
NAME:  Tyler [name], MRN:  [id number], DOB:  August 04, 1960, LOS: 8 ADMISSION DATE:  04/19/2019, CONSULTATION DATE:  10/13 REFERRING MD:  samtini, CHIEF COMPLAINT:  Recurrent and loculated pleural effusion    Brief History                                                                                                                                         58 year old male patient with history of stage III lung cancer currently on chemotherapy, status post 30 cycles of XRT admitted on 10/8 with a working diagnosis of postobstructive pneumonia and parapneumonic right pleural effusion.  Pulmonary asked to evaluate on 10/13 for consideration of possible chest tube and administration of TPA/Pulmozyme                                                                                                                                                                                                                        Past Medical History  Stage III lung cancer diagnosed July 2020 last chemotherapy received 8/24 carboplatin/paclitaxel (s/p 5 cycles) He is status post 30 fractions of XRT. Most recent hospital admit 9/25 for acute pulmonary emboli with bilateral lower extremity DVTs, placed on Lovenox History of EtOH abuse History of tobacco abuse  Significant Hospital Events   10/8: Admitted with working diagnosis of postobstructive pneumonia and pleural effusion 10/9: Therapeutic and diagnostic thoracentesis performed on the right side yielding 1.2 L of pleural fluid, fluid evaluation was consistent with exudate cytology not sent Cultures negative.  Case discussed with thoracic surgery felt not a candidate for thoracotomy 10/12: Interventional radiology contacted for evaluation of possible Pleurx catheter They felt they felt given loculated findings might be better served with chest tube  10/13: Pulmonary asked to evaluate 10/14 CT guided chest tube placed w/ successful evacuation of loculated pleural  collection however still head additional loculated fluid collection  on chest x-ray 10/15: TPA and Pulmozyme administered, 580 mL of bloody pleural fluid evacuated 10/16: Remarkable improvement in chest x-ray with almost complete resolution of pleural fluid collection Consults:  Thoracic surgery consulted over phone by internal medicine 10/10 deemed not a thoracic surgery candidate Interventional radiology consulted 10/12 Pulmonary: Consulted 10/13  Procedures:  9/26 thora RT >> neutrophilic transudate Right pleural effusion thoracentesis 10/9:  1.2 L of turbid neutrophilic exudate with low glucose  10/14 CT guided RT perc chest drain 10/15 TPA and Pulmozyme administered intrapleurally  Significant Diagnostic Tests:  CT chest 10/8:Increased right pleural effusion is noted with associated right basilar atelectasis or infiltrate. Stable left midlung and basilar opacity as described above.  Micro Data:   Pleural fluid 10/9: Negative Blood cultures 10/8: 1 out of 2 coag negative staph Sputum culture 10/9: Normal flora  Antimicrobials:  Unasyn 10/8  Interim history/subjective:  Little impulsive and confused today otherwise from a breathing standpoint looks much improved   Objective   Blood pressure 104/82, pulse 70, temperature (Abnormal) 97.3 F (36.3 C), resp. rate 16, height 6' 2.02" (1.88 m), weight 41 kg, SpO2 99 %.        Intake/Output Summary (Last 24 hours) at 04/27/2019 0926 Last data filed at 04/27/2019 0859 Gross per 24 hour  Intake 600 ml  Output 1305 ml  Net -705 ml   Filed Weights   04/19/19 2321 04/27/19 0441  Weight: 59 kg 41 kg    Examination: General 58 year old male patient resting in bed he is currently in no acute distress HEENT normocephalic atraumatic no jugular venous distention appreciated Pulmonary: Diminished right some basilar rales chest tube in place no air leak Cardiac: Regular rate and rhythm Abdomen: Soft nontender Extremities: Warm  and dry Neuro: Awake oriented a little impulsive today  Resolved Hospital Problem list     Assessment & Plan:  Stage III recurrent lung cancer Probable post obstructive PNA Recurrent exudative effusion (now loculated) Cancer related malnutrition  Discussion -portable chest x-ray personally reviewed demonstrates remarkable improvement in pleural fluid with almost complete resolution of residual effusion chest tube in satisfactory position, basilar airspace disease remains Plan Continue antibiotics Continue chest tube for another 24 to 48 hours No need for further TPA or DNase  Acute PE/DVT- Plan Continue low molecular weight heparin  Erick Colace ACNP-BC [place] Pager # [phone] OR # [phone] if no answer

## 2019-04-27 NOTE — Progress Notes (Signed)
Pt has begun having hallucinations , "feels like Im falling" and the room is moving. Difficult for myself & the NT to reorient. O2 sat =100% on 2 lnc

## 2019-04-27 NOTE — Progress Notes (Signed)
Physical Therapy Treatment Patient Details Name: Tyler [name] MRN: [id number] DOB: 1961/05/12 Today's Date: 04/27/2019    History of Present Illness 58 yo male admitted to ED on 10/8 with R chest wall pain due to lung cancer. Thoracentesis 10/9 yielding 1.2 L yellow fluid. PMH includes alcohol and tobacco use, COPD, PNA, PE.    PT Comments    Pt with chest tube intact and o2 intact. He reluctantly worked with PT, but was impulsive and restless.  He refused gait, but did get to recliner with decreased safety awareness.  Con't to recommend SNF.   Follow Up Recommendations  SNF     Equipment Recommendations  None recommended by PT    Recommendations for Other Services       Precautions / Restrictions Precautions Precautions: Fall;Other (comment)(chest tube) Precaution Comments: sats Restrictions Weight Bearing Restrictions: No    Mobility  Bed Mobility Overal bed mobility: Modified Independent Bed Mobility: Supine to Sit     Supine to sit: Modified independent (Device/Increase time);HOB elevated     General bed mobility comments: Came to EOB with HOB elevated with no A  Transfers Overall transfer level: Needs assistance Equipment used: 1 person hand held assist Transfers: Sit to/from [facility] Sit to Stand: Min guard Stand pivot transfers: Min assist       General transfer comment: flexed posture with SPT and MIN A to complete transfer and not sit too early.  Ambulation/Gait             General Gait Details: Pt declined   Stairs             Wheelchair Mobility    Modified Rankin (Stroke Patients Only)       Balance   Sitting-balance support: No upper extremity supported;Feet supported Sitting balance-Leahy Scale: Fair       Standing balance-Leahy Scale: Poor Standing balance comment: Pt would not fully stand up, but only did a squat pivot                            Cognition Arousal/Alertness:  Awake/alert Behavior During Therapy: Impulsive;Restless Overall Cognitive Status: No family/caregiver present to determine baseline cognitive functioning                                 General Comments: Pt difficult to understand at times. Impulsive and not receptive to PT suggestions, but once he decided he wanted sheets changed he agreed to get to chair only.      Exercises      General Comments        Pertinent Vitals/Pain Pain Assessment: No/denies pain    Home Living                      Prior Function            PT Goals (current goals can now be found in the care plan section) Acute Rehab PT Goals PT Goal Formulation: With patient Time For Goal Achievement: 05/05/19 Potential to Achieve Goals: Good Progress towards PT goals: Progressing toward goals    Frequency    Min 2X/week      PT Plan Current plan remains appropriate    Co-evaluation              AM-PAC PT "6 Clicks" Mobility   Outcome Measure  Help needed turning from your  back to your side while in a flat bed without using bedrails?: A Little Help needed moving from lying on your back to sitting on the side of a flat bed without using bedrails?: A Little Help needed moving to and from a bed to a chair (including a wheelchair)?: A Little Help needed standing up from a chair using your arms (e.g., wheelchair or bedside chair)?: A Little Help needed to walk in hospital room?: A Lot Help needed climbing 3-5 steps with a railing? : Total 6 Click Score: 15    End of Session Equipment Utilized During Treatment: Oxygen Activity Tolerance: Treatment limited secondary to agitation Patient left: in chair;with call bell/phone within reach;Other (comment)(chair alarm in place, but not alarm box in room. RN/NT aware) Nurse Communication: Mobility status PT Visit Diagnosis: Other abnormalities of gait and mobility (R26.89);Muscle weakness (generalized) (M62.81);Unsteadiness on  feet (R26.81)     Time: 3335-4562 PT Time Calculation (min) (ACUTE ONLY): 14 min  Charges:  $Therapeutic Activity: 8-22 mins                     Clarice Bonaventure L. Tamala Julian, [place] Pager [phone] 04/27/2019    Galen Manila 04/27/2019, 12:29 PM

## 2019-04-27 NOTE — Progress Notes (Signed)
[facility]  PROGRESS NOTE  Tyler [name] KWI:[id number] DOB: February 07, 1961 DOA: 04/19/2019 PCP: Tyler Males, MD  Brief History    Tyler [name] is a 58 y.o. year old male with medical history significant for recently diagnosed stage III lung cancer with C7/2020, completed chemotherapy cycles), recent PE/bilateral DVT on Lovenox (diagnosed 9/25), who initially presented to ED on 04/19/2019 with worsening right-sided chest pain and due to no acute findings on  CXR sent home with perocet. He returned later that same day with  Confusion, fever and shortness of breath and admitted for sepsis secondary to pneumonia in setting of malignancy.  Hospital Course:  CT chest showing loculated right pleural effusion concern for parapneumonic effusion given neutrophilic exudate and low glucose in diagnostic studies (therapeutic/diagnostic thoracentesis on 10/9, 1.2 L removed, cultures negative).  Case was discussed with thoracic surgery (10/10) who felt patient recommended no indication for thoracotomy.  Case was discussed with IR for possible Pleurx catheter but given loculated findings recommended pulmonary consultation.  Patient underwent IR guided chest tube  A & P     Atrial fibrillation, rate controlled.  Briefly required IV diltiazem push, remains rate controlled on oral metoprolol 25 mg twice daily, closely monitor.  TSH within normal limit.  Goal K greater than 4, magnesium greater than 2,   Loculated right pleural effusion, concern for parapneumonic effusion, improving.  Patient underwent IR guided chest tube on 10/14, TPA/ DNase administered by pulmonology , serial chest x-ray showed almost full resolution of effusion, keep chest tube and monitor for continue drainage, also continue empiric antibiotics for least 10 days per pulmonary recommendations.      Pneumonia, most likely postobstructive.  Stable on 4 L O2, no worsening respiratory effort.  Continue Unasyn for total of 10 days  (today day 9) remains afebrile, WBC continues elevated currently at 15, blood cultures unremarkable   Pulmonary embolism/bilateral DVTs, malignancy related.  Lovenox, appreciate pharmacology help   Acute on chronic hypoxic respiratory failure, stable.  Currently on 4 L has been stable for the past 24 hours.  Multifactorial etiology due to above.  See if able to wean O2 with chest tube placement, continue antibiotics.   Hypokalemia/hypomagnesemia.  Mag potassium persistently low, will correct especially in setting of A. fib .  Repeat in a.m.   Stage III non-small lung cancer, diagnosed 01/2019.  Patient has evidence of disease progression despite chemoradiation treatment. Dr. Earlie [name] made aware of patient's status by admitting physician   COPD.  No wheezing.  No worsening respiratory effort.  Continue Incruse.     Reflux, stable.  Continue Carafate   Malnutrition, moderate, likely in the setting of malignancy.  Dietary consulted, following nutritional supplementation recommendations.      DVT prophylaxis: Lovenox Code Status: Full Family Communication: No family at bedside Disposition Plan: Monitor chest tube drainage a monitor heart rate, magnesium/potassium correction, PT recommends SNF     [facility] Direct contact: see [email or web address] (further directions at bottom of note if needed) 7PM-7AM contact night coverage as at bottom of note 04/27/2019, 1:39 PM  LOS: 8 days   Consultants  . IR, pulmonology  Procedures  . IR guided chest tube placement ((right-sided), 10/14 . Therapeutic/diagnostic thoracentesis 10/9  Antibiotics  . Unasyn 10/8-- . Cefepime 10/8  Interval History/Subjective  Stable breathing Pain well controlled Sleepy this morning  Objective   Vitals:  Vitals:   04/27/19 0438 04/27/19 1338  BP: 104/82 104/80  Pulse: 70 81  Resp: 16 18  Temp: (!)  97.3 F (36.3 C) 98.2 F (36.8 C)  SpO2: 99% 100%    Exam:  Awake Alert, oriented,  frail/cachectic African-American male, no distress Symmetrical Chest wall movement, diminished breath sounds throughout, normal respiratory effort on 4 L O2, no increased work of breathing Chest tube in place Irregularly irregular rhythm, normal rate  Abd Soft, No tenderness,  No edema,   I have personally reviewed the following:   Data Reviewed: Basic Metabolic Panel: Recent Labs  Lab 04/22/19 0552 04/23/19 0530 04/25/19 0646 04/26/19 0531 04/27/19 0533  NA 135 136 137 134* 136  K 3.5 3.6 3.1* 3.3* 4.1  CL 97* 97* 99 97* 99  CO2 27 26 30 29 28   GLUCOSE 94 90 104* 93 108*  BUN 11 11 8 8 10   CREATININE 0.54* 0.47* 0.41* 0.32* 0.47*  CALCIUM 7.9* 8.2* 7.6* 7.4* 8.1*  MG  --   --  1.5* 1.7  --    Liver Function Tests: Recent Labs  Lab 04/21/19 0636  AST 16  ALT 12  ALKPHOS 68  BILITOT 0.5  PROT 5.7*  ALBUMIN 1.8*   No results for input(s): LIPASE, AMYLASE in the last 168 hours. No results for input(s): AMMONIA in the last 168 hours. CBC: Recent Labs  Lab 04/21/19 0636 04/22/19 0552 04/23/19 0530 04/25/19 0646 04/26/19 0531 04/27/19 0533  WBC 17.0* 17.4* 19.0* 12.9* 13.5* 15.8*  NEUTROABS 15.1* 14.9* 15.8* 10.6*  --   --   HGB 8.0* 8.7* 9.2* 7.7* 8.1* 8.0*  HCT 25.5* 27.5* 28.6* 24.4* 25.9* 25.6*  MCV 88.5 86.5 83.6 85.9 85.8 87.7  PLT 232 258 254 211 208 243   Cardiac Enzymes: No results for input(s): CKTOTAL, CKMB, CKMBINDEX, TROPONINI in the last 168 hours. BNP (last 3 results) Recent Labs    04/06/19 2217 04/19/19 1242  BNP 251.3* 136.6*    ProBNP (last 3 results) No results for input(s): PROBNP in the last 8760 hours.  CBG: No results for input(s): GLUCAP in the last 168 hours.  Recent Results (from the past 240 hour(s))  Blood Culture (routine x 2)     Status: Abnormal   Collection Time: 04/19/19 12:42 PM   Specimen: BLOOD RIGHT FOREARM  Result Value Ref Range Status   Specimen Description   Final    BLOOD RIGHT FOREARM Performed at  [facility] [address], [place], [place] [address]    Special Requests   Final    BOTTLES DRAWN AEROBIC AND ANAEROBIC Blood Culture adequate volume Performed at [facility] [address] [place], [place] [address]    Culture  Setup Time   Final    GRAM POSITIVE COCCI AEROBIC BOTTLE ONLY CRITICAL RESULT CALLED TO, READ BACK BY AND VERIFIED WITH: Melodye Ped PharmD 10:35 04/20/19 ([email or web address])    Culture (A)  Final    STAPHYLOCOCCUS SPECIES (COAGULASE NEGATIVE) THE SIGNIFICANCE OF ISOLATING THIS ORGANISM FROM A SINGLE SET OF BLOOD CULTURES WHEN MULTIPLE SETS ARE DRAWN IS UNCERTAIN. PLEASE NOTIFY THE MICROBIOLOGY DEPARTMENT WITHIN ONE WEEK IF SPECIATION AND SENSITIVITIES ARE REQUIRED. Performed at [facility], [place] [address] [place], [place] [address]    Report Status 04/21/2019 FINAL  Final  Blood Culture ID Panel (Reflexed)     Status: Abnormal   Collection Time: 04/19/19 12:42 PM  Result Value Ref Range Status   Enterococcus species NOT DETECTED NOT DETECTED Final   Listeria monocytogenes NOT DETECTED NOT DETECTED Final   Staphylococcus species DETECTED (A) NOT DETECTED  Final    Comment: Methicillin (oxacillin) susceptible coagulase negative staphylococcus. Possible blood culture contaminant (unless isolated from more than one blood culture draw or clinical case suggests pathogenicity). No antibiotic treatment is indicated for blood  culture contaminants. CRITICAL RESULT CALLED TO, READ BACK BY AND VERIFIED WITH: Melodye Ped PharmD 10:35 04/20/19 ([email or web address])    Staphylococcus aureus (BCID) NOT DETECTED NOT DETECTED Final   Methicillin resistance NOT DETECTED NOT DETECTED Final   Streptococcus species NOT DETECTED NOT DETECTED Final   Streptococcus agalactiae NOT DETECTED NOT DETECTED Final   Streptococcus pneumoniae NOT DETECTED NOT DETECTED Final   Streptococcus pyogenes NOT DETECTED NOT DETECTED Final   Acinetobacter baumannii NOT  DETECTED NOT DETECTED Final   Enterobacteriaceae species NOT DETECTED NOT DETECTED Final   Enterobacter cloacae complex NOT DETECTED NOT DETECTED Final   Escherichia coli NOT DETECTED NOT DETECTED Final   Klebsiella oxytoca NOT DETECTED NOT DETECTED Final   Klebsiella pneumoniae NOT DETECTED NOT DETECTED Final   Proteus species NOT DETECTED NOT DETECTED Final   Serratia marcescens NOT DETECTED NOT DETECTED Final   Haemophilus influenzae NOT DETECTED NOT DETECTED Final   Neisseria meningitidis NOT DETECTED NOT DETECTED Final   Pseudomonas aeruginosa NOT DETECTED NOT DETECTED Final   Candida albicans NOT DETECTED NOT DETECTED Final   Candida glabrata NOT DETECTED NOT DETECTED Final   Candida krusei NOT DETECTED NOT DETECTED Final   Candida parapsilosis NOT DETECTED NOT DETECTED Final   Candida tropicalis NOT DETECTED NOT DETECTED Final    Comment: Performed at [facility], [address] [place], [place] [address]  SARS Coronavirus 2 by RT PCR (hospital order, performed in [facility] hospital lab) Nasopharyngeal Nasopharyngeal Swab     Status: None   Collection Time: 04/19/19  2:13 PM   Specimen: Nasopharyngeal Swab  Result Value Ref Range Status   SARS Coronavirus 2 NEGATIVE NEGATIVE Final    Comment: (NOTE) If result is NEGATIVE SARS-CoV-2 target nucleic acids are NOT DETECTED. The SARS-CoV-2 RNA is generally detectable in upper and lower  respiratory specimens during the acute phase of infection. The lowest  concentration of SARS-CoV-2 viral copies this assay can detect is 250  copies / mL. A negative result does not preclude SARS-CoV-2 infection  and should not be used as the sole basis for treatment or other  patient management decisions.  A negative result may occur with  improper specimen collection / handling, submission of specimen other  than nasopharyngeal swab, presence of viral mutation(s) within the  areas targeted by this assay, and inadequate number of viral  copies  (<250 copies / mL). A negative result must be combined with clinical  observations, patient history, and epidemiological information. If result is POSITIVE SARS-CoV-2 target nucleic acids are DETECTED. The SARS-CoV-2 RNA is generally detectable in upper and lower  respiratory specimens dur ing the acute phase of infection.  Positive  results are indicative of active infection with SARS-CoV-2.  Clinical  correlation with patient history and other diagnostic information is  necessary to determine patient infection status.  Positive results do  not rule out bacterial infection or co-infection with other viruses. If result is PRESUMPTIVE POSTIVE SARS-CoV-2 nucleic acids MAY BE PRESENT.   A presumptive positive result was obtained on the submitted specimen  and confirmed on repeat testing.  While 2019 novel coronavirus  (SARS-CoV-2) nucleic acids may be present in the submitted sample  additional confirmatory testing may be necessary for epidemiological  and / or clinical management purposes  to differentiate between  SARS-CoV-2 and other Sarbecovirus currently known to infect humans.  If clinically indicated additional testing with an alternate test  methodology [phone]) is advised. The SARS-CoV-2 RNA is generally  detectable in upper and lower respiratory sp ecimens during the acute  phase of infection. The expected result is Negative. Fact Sheet for Patients:  StrictlyIdeas.no Fact Sheet for Healthcare Providers: BankingDealers.co.za This test is not yet approved or cleared by the [place] FDA and has been authorized for detection and/or diagnosis of SARS-CoV-2 by FDA under an Emergency Use Authorization (EUA).  This EUA will remain in effect (meaning this test can be used) for the duration of the COVID-19 declaration under Section 564(b)(1) of the Act, 21 U.S.C. section 360bbb-3(b)(1), unless the authorization is terminated  or revoked sooner. Performed at [facility], [place] [address], [place], [place] [address]   Culture, blood (Routine X 2) w Reflex to ID Panel     Status: None   Collection Time: 04/19/19  3:00 PM   Specimen: BLOOD  Result Value Ref Range Status   Specimen Description   Final    BLOOD RIGHT HAND Performed at [facility] [address] [place], [place] [address]    Special Requests   Final    BOTTLES DRAWN AEROBIC AND ANAEROBIC Blood Culture results may not be optimal due to an inadequate volume of blood received in culture bottles Performed at [facility] [address] [place], [place] [address]    Culture   Final    NO GROWTH 5 DAYS Performed at [facility], [place] [address] [place], [place] [address]    Report Status 04/24/2019 FINAL  Final  Urine culture     Status: None   Collection Time: 04/19/19  3:13 PM   Specimen: In/Out Cath Urine  Result Value Ref Range Status   Specimen Description   Final    IN/OUT CATH URINE Performed at [facility] [address], [place], [place] [address]    Special Requests   Final    NONE Performed at [facility], [place] [address] [place], [place] [address]    Culture   Final    Multiple bacterial morphotypes present, none predominant. Suggest appropriate recollection if clinically indicated.   Report Status 04/20/2019 FINAL  Final  Culture, sputum-assessment     Status: None   Collection Time: 04/20/19 10:38 AM   Specimen: Sputum  Result Value Ref Range Status   Specimen Description SPUTUM  Final   Special Requests NONE  Final   Sputum evaluation   Final    THIS SPECIMEN IS ACCEPTABLE FOR SPUTUM CULTURE Performed at [facility], [place] [address] [place], [place] [address]    Report Status 04/20/2019 FINAL  Final  Culture, respiratory     Status: None   Collection Time: 04/20/19 10:38 AM   Specimen: SPU   Result Value Ref Range Status   Specimen Description   Final    SPUTUM Performed at [facility] [address], [place], [place] [address]    Special Requests   Final    NONE Reflexed from [id number] Performed at [facility], [place] [address], [place], [place] [address]    Gram Stain   Final    RARE WBC PRESENT,BOTH PMN AND MONONUCLEAR RARE SQUAMOUS EPITHELIAL CELLS PRESENT FEW GRAM POSITIVE RODS RARE YEAST RARE GRAM NEGATIVE RODS FEW GRAM POSITIVE COCCI  Culture   Final    FEW Consistent with normal respiratory flora. Performed at [facility], [place] [name], [place], [place] [address]    Report Status 04/22/2019 FINAL  Final  Culture, body fluid-bottle     Status: None   Collection Time: 04/20/19  3:39 PM   Specimen: Fluid  Result Value Ref Range Status   Specimen Description FLUID PLEURAL  Final   Special Requests   Final    BOTTLES DRAWN AEROBIC AND ANAEROBIC Blood Culture adequate volume   Culture   Final    NO GROWTH Performed at [facility], [address] [place], [place] [address]    Report Status 04/23/2019 FINAL  Final  Gram stain     Status: None   Collection Time: 04/20/19  3:39 PM   Specimen: Fluid  Result Value Ref Range Status   Specimen Description FLUID PLEURAL  Final   Special Requests NONE  Final   Gram Stain   Final    RARE WBC PRESENT, PREDOMINANTLY PMN NO ORGANISMS SEEN Performed at [facility], [place] [address], [place], [place] [address]    Report Status 04/21/2019 FINAL  Final     Studies: Dg Chest Port 1 View  Result Date: 04/27/2019 CLINICAL DATA:  Acute respiratory failure. EXAM: PORTABLE CHEST 1 VIEW COMPARISON:  04/26/2019 FINDINGS: Stable position of the right basilar chest tube. Improved aeration at the right lung base suggesting decreased right pleural effusion. Persistent linear and patchy densities at the right lung base. No evidence for pneumothorax. Hazy interstitial  densities in the left lung are unchanged. Heart size is within normal limits and stable. The trachea is midline. IMPRESSION: 1. Improved aeration of the right lung base compatible with decreased right pleural fluid. Right chest tube is in a stable position. 2. Negative for pneumothorax. 3. Linear and nodular right basilar densities are nonspecific. Electronically Signed   By: Markus Daft M.D.   On: 04/27/2019 09:29   Dg Chest Port 1 View  Result Date: 04/26/2019 CLINICAL DATA:  Pleural effusion EXAM: PORTABLE CHEST 1 VIEW COMPARISON:  04/24/2019 FINDINGS: Right basilar pleural chest tube in place with decreasing right effusion. Moderate right effusion remains. SPECT small right apical pneumothorax. Right base atelectasis or infiltrate. Diffuse interstitial prominence throughout the lungs. Heart is normal size. IMPRESSION: Decreasing right effusion following chest tube placement. Suspect small right apical pneumothorax with continued moderate right pleural effusion. Right basilar atelectasis or infiltrate. Stable diffuse interstitial prominence throughout the lungs. Electronically Signed   By: Rolm Baptise M.D.   On: 04/26/2019 09:51    Scheduled Meds: . enoxaparin  90 mg Subcutaneous Q24H  . feeding supplement  1 Container Oral BID BM  . magnesium oxide  400 mg Oral BID  . mouth rinse  15 mL Mouth Rinse BID  . metoprolol tartrate  25 mg Oral BID  . multivitamin with minerals  1 tablet Oral Daily  . polyethylene glycol  17 g Oral Daily  . potassium chloride SA  20 mEq Oral Daily  . sodium chloride flush  5 mL Intracatheter Q8H  . sucralfate  1 g Oral TID WC & HS  . umeclidinium bromide  1 puff Inhalation Daily   Continuous Infusions: . sodium chloride 10 mL/hr at 04/26/19 2036  . ampicillin-sulbactam (UNASYN) IV 3 g (04/27/19 0820)    Principal Problem:   Aspiration pneumonia ([place]) Active Problems:   Acute respiratory failure with hypoxia (HCC)   Protein-calorie malnutrition, severe  Pulmonary embolism (HCC)   Pleural effusion, right   PNA (pneumonia)   Malnutrition of moderate degree   Atrial fibrillation with RVR ([place])      Desiree Hane  [facility]

## 2019-04-28 ENCOUNTER — Inpatient Hospital Stay (HOSPITAL_COMMUNITY): Payer: Medicaid Other

## 2019-04-28 DIAGNOSIS — Z923 Personal history of irradiation: Secondary | ICD-10-CM

## 2019-04-28 DIAGNOSIS — Z9889 Other specified postprocedural states: Secondary | ICD-10-CM

## 2019-04-28 DIAGNOSIS — E440 Moderate protein-calorie malnutrition: Secondary | ICD-10-CM

## 2019-04-28 DIAGNOSIS — Z9221 Personal history of antineoplastic chemotherapy: Secondary | ICD-10-CM

## 2019-04-28 DIAGNOSIS — J918 Pleural effusion in other conditions classified elsewhere: Secondary | ICD-10-CM

## 2019-04-28 DIAGNOSIS — C3491 Malignant neoplasm of unspecified part of right bronchus or lung: Secondary | ICD-10-CM

## 2019-04-28 DIAGNOSIS — J189 Pneumonia, unspecified organism: Secondary | ICD-10-CM

## 2019-04-28 LAB — CBC
HCT: 23 % — ABNORMAL LOW (ref 39.0–52.0)
Hemoglobin: 7.3 g/dL — ABNORMAL LOW (ref 13.0–17.0)
MCH: 26.7 pg (ref 26.0–34.0)
MCHC: 31.7 g/dL (ref 30.0–36.0)
MCV: 84.2 fL (ref 80.0–100.0)
Platelets: 236 10*3/uL (ref 150–400)
RBC: 2.73 MIL/uL — ABNORMAL LOW (ref 4.22–5.81)
RDW: 20 % — ABNORMAL HIGH (ref 11.5–15.5)
WBC: 17.4 10*3/uL — ABNORMAL HIGH (ref 4.0–10.5)
nRBC: 0 % (ref 0.0–0.2)

## 2019-04-28 MED ORDER — FENTANYL CITRATE (PF) 100 MCG/2ML IJ SOLN
INTRAMUSCULAR | Status: AC
  Filled 2019-04-28: qty 2

## 2019-04-28 MED ORDER — OXYCODONE HCL 5 MG PO TABS
10.0000 mg | ORAL_TABLET | ORAL | Status: DC | PRN
  Administered 2019-04-28 – 2019-05-02 (×11): 10 mg via ORAL
  Filled 2019-04-28 (×13): qty 2

## 2019-04-28 MED ORDER — IPRATROPIUM-ALBUTEROL 0.5-2.5 (3) MG/3ML IN SOLN
3.0000 mL | Freq: Four times a day (QID) | RESPIRATORY_TRACT | Status: DC
  Administered 2019-04-28: 15:00:00 3 mL via RESPIRATORY_TRACT
  Filled 2019-04-28: qty 3

## 2019-04-28 MED ORDER — ENOXAPARIN SODIUM 100 MG/ML ~~LOC~~ SOLN
60.0000 mg | SUBCUTANEOUS | Status: DC
  Administered 2019-05-01: 60 mg via SUBCUTANEOUS
  Filled 2019-04-28 (×3): qty 1

## 2019-04-28 MED ORDER — IPRATROPIUM-ALBUTEROL 0.5-2.5 (3) MG/3ML IN SOLN
3.0000 mL | RESPIRATORY_TRACT | Status: DC | PRN
  Administered 2019-05-01: 3 mL via RESPIRATORY_TRACT
  Filled 2019-04-28: qty 3

## 2019-04-28 MED ORDER — STERILE WATER FOR INJECTION IJ SOLN
5.0000 mg | Freq: Once | RESPIRATORY_TRACT | Status: AC
  Administered 2019-04-28: 5 mg via INTRAPLEURAL
  Filled 2019-04-28: qty 5

## 2019-04-28 MED ORDER — FENTANYL CITRATE (PF) 100 MCG/2ML IJ SOLN
25.0000 ug | INTRAMUSCULAR | Status: DC | PRN
  Administered 2019-04-28 – 2019-05-02 (×12): 25 ug via INTRAVENOUS
  Filled 2019-04-28 (×12): qty 2

## 2019-04-28 MED ORDER — IPRATROPIUM-ALBUTEROL 0.5-2.5 (3) MG/3ML IN SOLN
3.0000 mL | Freq: Three times a day (TID) | RESPIRATORY_TRACT | Status: DC
  Administered 2019-04-28 – 2019-05-01 (×8): 3 mL via RESPIRATORY_TRACT
  Filled 2019-04-28 (×8): qty 3

## 2019-04-28 MED ORDER — BUDESONIDE 0.25 MG/2ML IN SUSP
0.2500 mg | Freq: Two times a day (BID) | RESPIRATORY_TRACT | Status: DC
  Administered 2019-04-28 – 2019-05-02 (×8): 0.25 mg via RESPIRATORY_TRACT
  Filled 2019-04-28 (×8): qty 2

## 2019-04-28 MED ORDER — ACETAMINOPHEN 500 MG PO TABS
500.0000 mg | ORAL_TABLET | Freq: Four times a day (QID) | ORAL | Status: DC | PRN
  Administered 2019-04-28 – 2019-05-01 (×7): 500 mg via ORAL
  Filled 2019-04-28 (×7): qty 1

## 2019-04-28 MED ORDER — SODIUM CHLORIDE (PF) 0.9 % IJ SOLN
10.0000 mg | Freq: Once | INTRAMUSCULAR | Status: AC
  Administered 2019-04-28: 10 mg via INTRAPLEURAL
  Filled 2019-04-28: qty 10

## 2019-04-28 NOTE — Progress Notes (Signed)
NAME:  Tyler [name], MRN:  [id number], DOB:  1960/10/04, LOS: 9 ADMISSION DATE:  04/19/2019, CONSULTATION DATE:  10/13 REFERRING MD:  [email or web address], CHIEF COMPLAINT:  Recurrent and loculated pleural effusion    Brief History                                                                                                                         58 year old male patient with history of stage III lung cancer currently on chemotherapy, status post 30 cycles of XRT admitted on 10/8 with a working diagnosis of postobstructive pneumonia and parapneumonic right pleural effusion.  Pulmonary asked to evaluate on 10/13 for consideration of possible chest tube and administration of TPA/Pulmozyme                                              Past Medical History  Stage III lung cancer diagnosed July 2020 last chemotherapy received 8/24 carboplatin/paclitaxel (s/p 5 cycles) He is status post 30 fractions of XRT. Most recent hospital admit 9/25 for acute pulmonary emboli with bilateral lower extremity DVTs, placed on Lovenox History of EtOH abuse History of tobacco abuse  Significant Hospital Events   10/8: Admitted with working diagnosis of postobstructive pneumonia and pleural effusion 10/9: Therapeutic and diagnostic thoracentesis performed on the right side yielding 1.2 L of pleural fluid, fluid evaluation was consistent with exudate cytology not sent Cultures negative.  Case discussed with thoracic surgery felt not a candidate for thoracotomy 10/12: Interventional radiology contacted for evaluation of possible Pleurx catheter They felt they felt given loculated findings might be better served with chest tube  10/13: Pulmonary asked to evaluate 10/14 CT guided chest tube placed w/ successful evacuation of loculated pleural collection however still head additional loculated fluid collection on chest x-ray 10/15: TPA and Pulmozyme administered, 580 mL of bloody pleural fluid evacuated 10/16: Remarkable  improvement in chest x-ray with almost complete resolution of pleural fluid collection  Consults:  Thoracic surgery consulted over phone by internal medicine 10/10 deemed not a thoracic surgery candidate Interventional radiology consulted 10/12 Pulmonary: Consulted 10/13  Procedures:  9/26 thora RT >> neutrophilic transudate Right pleural effusion thoracentesis 10/9:  1.2 L of turbid neutrophilic exudate with low glucose  10/14 CT guided RT perc chest drain 10/15 TPA and Pulmozyme administered intrapleurally  Significant Diagnostic Tests:  CT chest 10/8:Increased right pleural effusion is noted with associated right basilar atelectasis or infiltrate. Stable left midlung and basilar opacity as described above.  Micro Data:   Pleural fluid 10/9: Negative Blood cultures 10/8: 1 out of 2 coag negative staph Sputum culture 10/9: Normal flora  Antimicrobials:  Unasyn 10/8  Interim history/subjective:   Overall doing well today.  He was glad to see me.  I am Mr. Schneller outpatient pulmonologist and have been seeing him for several months.  Including making the diagnosis  of his lung cancer.  We had a good chat today.  We discussed his goals in recovery.  We also went over his multiple medical problems including the infected pleural space, PE and cancer.  At this time he understands that his cancer treatments would be on hold until he was ever to recover to the point that he can return.  He feels a little bit defeated.  We also discussed his CODE STATUS today.  After discussing what a intubation mechanical ventilation or cardiac arrest situation would look like in his state he believes that being a DNR is the most appropriate.  I agree with him after discussing the risk benefits and alternatives.  Orders in the chart have been changed to reflect this.  Objective   Blood pressure 103/75, pulse 75, temperature (!) 97.4 F (36.3 C), temperature source Oral, resp. rate 16, height 6' 2.02" (1.88  m), weight 42.5 kg, SpO2 100 %.        Intake/Output Summary (Last 24 hours) at 04/28/2019 1009 Last data filed at 04/27/2019 2233 Gross per 24 hour  Intake 5 ml  Output 1650 ml  Net -1645 ml   Filed Weights   04/19/19 2321 04/27/19 0441 04/28/19 0500  Weight: 59 kg 41 kg 42.5 kg    Examination: General: Resting in bed no apparent distress HEENT NCAT sclera clear, temporalis muscle wasting Pulmonary: Diminished bilateral breath sounds no leak, right chest drain in place Cardiac: Regular rate and rhythm, S1-S2 Chest: Loss of supraclavicular fat pad Abdomen: Soft, nontender, nondistended Extremities: Significant muscle wasting, cachexia Neuro: Awake alert following commands  Resolved Hospital Problem list     Assessment & Plan:   Stage III recurrent lung cancer Probable post obstructive PNA Exudative parapneumonic effusion, loculated status post pigtail catheter placement and TPA DNase Cancer related malnutrition  Plan Continue Unasyn Continue chest tube at -20 cm water. Continue chest tube flushing protocol with saline. Appears to have continued output. Repeat TPA and dornase instillation this afternoon. Repeat noncontrasted CT of the chest in the morning.  Acute PE/DVT- Plan Continue Lovenox  Goals of care: Plan: Patient has multiple medical comorbidities. 28 minutes spent discussing advanced care planning with the patient to include his CODE STATUS. DNR orders placed. Palliative care consulted.   Garner Nash, DO [facility] 04/28/2019 10:10 AM  Personal pager: [phone] If unanswered, please page CCM On-call: [phone]

## 2019-04-28 NOTE — Procedures (Signed)
Interpleural Fibrinolytic Therapy Installation Procedure Note:  Patient was placed in semi-recumbent position.  Stopcock on existing indwelling pleural catheter was cleaned and sterilized.  10 mg of alteplase and 5 mg of DNase were instilled slowly into the pleural space. The stopcock was closed in the off position towards the patient. The patient was instructed to alternate positions with in bed every 15 minutes for a total of 1 hour dwell time.  The nursing staff was instructed to open the stopcock and allow -20 cm water suction to the atrium after 1 hour of intrapleural fibrinolytic therapy dwell time.  Please notify physician if there is pleuritic chest pain or blood return into the atrium.   Garner Nash, DO [facility] 04/28/2019 5:58 PM

## 2019-04-28 NOTE — Progress Notes (Signed)
[facility]  PROGRESS NOTE  Tyler [name] NGE:[id number] DOB: 10/06/1960 DOA: 04/19/2019 PCP: Brand Males, MD  Brief History    Tyler [name] is a 58 y.o. year old male with medical history significant for recently diagnosed stage III lung cancer with C7/2020, completed chemotherapy cycles), recent PE/bilateral DVT on Lovenox (diagnosed 9/25), who initially presented to ED on 04/19/2019 with worsening right-sided chest pain and due to no acute findings on  CXR sent home with perocet. He returned later that same day with  Confusion, fever and shortness of breath and admitted for sepsis secondary to pneumonia in setting of malignancy.  Hospital Course:  CT chest showing loculated right pleural effusion concern for parapneumonic effusion given neutrophilic exudate and low glucose in diagnostic studies (therapeutic/diagnostic thoracentesis on 10/9, 1.2 L removed, cultures negative).  Case was discussed with thoracic surgery (10/10) who felt patient recommended no indication for thoracotomy.  Case was discussed with IR for possible Pleurx catheter but given loculated findings recommended pulmonary consultation.  Patient underwent IR guided chest tube  A & P     Atrial fibrillation, rate controlled.  Briefly required IV diltiazem push, remains rate controlled on oral metoprolol 25 mg twice daily, closely monitor.  TSH within normal limit.  Goal K greater than 4, magnesium greater than 2,   Loculated right pleural effusion, concern for parapneumonic effusion, improving.  Patient underwent IR guided chest tube on 10/14, TPA/ Dnase to be administered by pulmonology , serial chest x-ray showed almost full resolution of effusion, keep chest tube and monitor for continue drainage, also continue empiric antibiotics for least 10 days per pulmonary recommendations. Goals of care discussion by pulm--> now DNR. Very poor prognosis, palliative consulted    Pneumonia, most likely postobstructive.   Stable on 4 L O2, no worsening respiratory effort.  Continue Unasyn for total of 10 days (today day 9) remains afebrile, WBC continues elevated currently at 15, blood cultures unremarkable   Pulmonary embolism/bilateral DVTs, malignancy related.  Lovenox, appreciate pharmacology help   Acute on chronic hypoxic respiratory failure, stable.  Currently on 4 L has been stable for the past 24 hours.  Multifactorial etiology due to above.  See if able to wean O2 with chest tube placement, continue antibiotics.   Hypokalemia/hypomagnesemia.  Mag potassium persistently low, will correct especially in setting of A. fib .  Repeat in a.m.   Stage III non-small lung cancer, diagnosed 01/2019.  Patient has evidence of disease progression despite chemoradiation treatment. Dr. [name] made aware of patient's status by admitting physician   COPD.  No wheezing.  No worsening respiratory effort.  Continue Incruse.     Reflux, stable.  Continue Carafate   Malnutrition, moderate, likely in the setting of malignancy.  Dietary consulted, following nutritional supplementation recommendations.      DVT prophylaxis: Lovenox Code Status: DNR Family Communication: No family at bedside, will update wife Disposition Plan: Monitor chest tube drainage a monitor heart rate, magnesium/potassium correction, poor prognosis      [facility] Direct contact: see [email or web address] (further directions at bottom of note if needed) 7PM-7AM contact night coverage as at bottom of note 04/28/2019, 7:24 PM  LOS: 9 days   Consultants  . IR, pulmonology  Procedures  . IR guided chest tube placement ((right-sided), 10/14 . Therapeutic/diagnostic thoracentesis 10/9  Antibiotics  . Unasyn 10/8-- . Cefepime 10/8  Interval History/Subjective  Stable breathing Upset about poor prognosis Doesn't want any more radiation  Objective   Vitals:  Vitals:  04/28/19 0828 04/28/19 1511  BP:    Pulse:    Resp:    Temp:  (!) 97.4 F (36.3 C)   SpO2:  99%    Exam:  Awake Alert, oriented, frail/cachectic African-American male, no distress Symmetrical Chest wall movement, diminished breath sounds throughout, normal respiratory effort on 4 L O2, no increased work of breathing Chest tube in place Irregularly irregular rhythm, normal rate  Abd Soft, No tenderness,  No edema,   I have personally reviewed the following:   Data Reviewed: Basic Metabolic Panel: Recent Labs  Lab 04/22/19 0552 04/23/19 0530 04/25/19 0646 04/26/19 0531 04/27/19 0533  NA 135 136 137 134* 136  K 3.5 3.6 3.1* 3.3* 4.1  CL 97* 97* 99 97* 99  CO2 27 26 30 29 28   GLUCOSE 94 90 104* 93 108*  BUN 11 11 8 8 10   CREATININE 0.54* 0.47* 0.41* 0.32* 0.47*  CALCIUM 7.9* 8.2* 7.6* 7.4* 8.1*  MG  --   --  1.5* 1.7  --    Liver Function Tests: No results for input(s): AST, ALT, ALKPHOS, BILITOT, PROT, ALBUMIN in the last 168 hours. No results for input(s): LIPASE, AMYLASE in the last 168 hours. No results for input(s): AMMONIA in the last 168 hours. CBC: Recent Labs  Lab 04/22/19 0552 04/23/19 0530 04/25/19 0646 04/26/19 0531 04/27/19 0533 04/28/19 0533  WBC 17.4* 19.0* 12.9* 13.5* 15.8* 17.4*  NEUTROABS 14.9* 15.8* 10.6*  --   --   --   HGB 8.7* 9.2* 7.7* 8.1* 8.0* 7.3*  HCT 27.5* 28.6* 24.4* 25.9* 25.6* 23.0*  MCV 86.5 83.6 85.9 85.8 87.7 84.2  PLT 258 254 211 208 243 236   Cardiac Enzymes: No results for input(s): CKTOTAL, CKMB, CKMBINDEX, TROPONINI in the last 168 hours. BNP (last 3 results) Recent Labs    04/06/19 2217 04/19/19 1242  BNP 251.3* 136.6*    ProBNP (last 3 results) No results for input(s): PROBNP in the last 8760 hours.  CBG: No results for input(s): GLUCAP in the last 168 hours.  Recent Results (from the past 240 hour(s))  Blood Culture (routine x 2)     Status: Abnormal   Collection Time: 04/19/19 12:42 PM   Specimen: BLOOD RIGHT FOREARM  Result Value Ref Range Status   Specimen  Description   Final    BLOOD RIGHT FOREARM Performed at [facility] [address] [place], [place] [address]    Special Requests   Final    BOTTLES DRAWN AEROBIC AND ANAEROBIC Blood Culture adequate volume Performed at [facility] [address], [place], [place] [address]    Culture  Setup Time   Final    GRAM POSITIVE COCCI AEROBIC BOTTLE ONLY CRITICAL RESULT CALLED TO, READ BACK BY AND VERIFIED WITH: Melodye Ped PharmD 10:35 04/20/19 ([email or web address])    Culture (A)  Final    STAPHYLOCOCCUS SPECIES (COAGULASE NEGATIVE) THE SIGNIFICANCE OF ISOLATING THIS ORGANISM FROM A SINGLE SET OF BLOOD CULTURES WHEN MULTIPLE SETS ARE DRAWN IS UNCERTAIN. PLEASE NOTIFY THE MICROBIOLOGY DEPARTMENT WITHIN ONE WEEK IF SPECIATION AND SENSITIVITIES ARE REQUIRED. Performed at [facility], [place] [address], [place], [place] [address]    Report Status 04/21/2019 FINAL  Final  Blood Culture ID Panel (Reflexed)     Status: Abnormal   Collection Time: 04/19/19 12:42 PM  Result Value Ref Range Status   Enterococcus species NOT DETECTED NOT DETECTED Final   Listeria monocytogenes NOT DETECTED NOT DETECTED Final  Staphylococcus species DETECTED (A) NOT DETECTED Final    Comment: Methicillin (oxacillin) susceptible coagulase negative staphylococcus. Possible blood culture contaminant (unless isolated from more than one blood culture draw or clinical case suggests pathogenicity). No antibiotic treatment is indicated for blood  culture contaminants. CRITICAL RESULT CALLED TO, READ BACK BY AND VERIFIED WITH: Melodye Ped PharmD 10:35 04/20/19 ([email or web address])    Staphylococcus aureus (BCID) NOT DETECTED NOT DETECTED Final   Methicillin resistance NOT DETECTED NOT DETECTED Final   Streptococcus species NOT DETECTED NOT DETECTED Final   Streptococcus agalactiae NOT DETECTED NOT DETECTED Final   Streptococcus pneumoniae NOT DETECTED NOT DETECTED Final   Streptococcus pyogenes NOT  DETECTED NOT DETECTED Final   Acinetobacter baumannii NOT DETECTED NOT DETECTED Final   Enterobacteriaceae species NOT DETECTED NOT DETECTED Final   Enterobacter cloacae complex NOT DETECTED NOT DETECTED Final   Escherichia coli NOT DETECTED NOT DETECTED Final   Klebsiella oxytoca NOT DETECTED NOT DETECTED Final   Klebsiella pneumoniae NOT DETECTED NOT DETECTED Final   Proteus species NOT DETECTED NOT DETECTED Final   Serratia marcescens NOT DETECTED NOT DETECTED Final   Haemophilus influenzae NOT DETECTED NOT DETECTED Final   Neisseria meningitidis NOT DETECTED NOT DETECTED Final   Pseudomonas aeruginosa NOT DETECTED NOT DETECTED Final   Candida albicans NOT DETECTED NOT DETECTED Final   Candida glabrata NOT DETECTED NOT DETECTED Final   Candida krusei NOT DETECTED NOT DETECTED Final   Candida parapsilosis NOT DETECTED NOT DETECTED Final   Candida tropicalis NOT DETECTED NOT DETECTED Final    Comment: Performed at [facility], [address], [place], [place] [address]  SARS Coronavirus 2 by RT PCR (hospital order, performed in [facility] hospital lab) Nasopharyngeal Nasopharyngeal Swab     Status: None   Collection Time: 04/19/19  2:13 PM   Specimen: Nasopharyngeal Swab  Result Value Ref Range Status   SARS Coronavirus 2 NEGATIVE NEGATIVE Final    Comment: (NOTE) If result is NEGATIVE SARS-CoV-2 target nucleic acids are NOT DETECTED. The SARS-CoV-2 RNA is generally detectable in upper and lower  respiratory specimens during the acute phase of infection. The lowest  concentration of SARS-CoV-2 viral copies this assay can detect is 250  copies / mL. A negative result does not preclude SARS-CoV-2 infection  and should not be used as the sole basis for treatment or other  patient management decisions.  A negative result may occur with  improper specimen collection / handling, submission of specimen other  than nasopharyngeal swab, presence of viral mutation(s) within the   areas targeted by this assay, and inadequate number of viral copies  (<250 copies / mL). A negative result must be combined with clinical  observations, patient history, and epidemiological information. If result is POSITIVE SARS-CoV-2 target nucleic acids are DETECTED. The SARS-CoV-2 RNA is generally detectable in upper and lower  respiratory specimens dur ing the acute phase of infection.  Positive  results are indicative of active infection with SARS-CoV-2.  Clinical  correlation with patient history and other diagnostic information is  necessary to determine patient infection status.  Positive results do  not rule out bacterial infection or co-infection with other viruses. If result is PRESUMPTIVE POSTIVE SARS-CoV-2 nucleic acids MAY BE PRESENT.   A presumptive positive result was obtained on the submitted specimen  and confirmed on repeat testing.  While 2019 novel coronavirus  (SARS-CoV-2) nucleic acids may be present in the submitted sample  additional confirmatory testing may be necessary for epidemiological  and / or clinical management purposes  to differentiate between  SARS-CoV-2 and other Sarbecovirus currently known to infect humans.  If clinically indicated additional testing with an alternate test  methodology [phone]) is advised. The SARS-CoV-2 RNA is generally  detectable in upper and lower respiratory sp ecimens during the acute  phase of infection. The expected result is Negative. Fact Sheet for Patients:  StrictlyIdeas.no Fact Sheet for Healthcare Providers: BankingDealers.co.za This test is not yet approved or cleared by the [place] FDA and has been authorized for detection and/or diagnosis of SARS-CoV-2 by FDA under an Emergency Use Authorization (EUA).  This EUA will remain in effect (meaning this test can be used) for the duration of the COVID-19 declaration under Section 564(b)(1) of the Act, 21 U.S.C.  section 360bbb-3(b)(1), unless the authorization is terminated or revoked sooner. Performed at [facility], [place] [address], [place], [place] [address]   Culture, blood (Routine X 2) w Reflex to ID Panel     Status: None   Collection Time: 04/19/19  3:00 PM   Specimen: BLOOD  Result Value Ref Range Status   Specimen Description   Final    BLOOD RIGHT HAND Performed at [facility] [address] [place], [place] [address]    Special Requests   Final    BOTTLES DRAWN AEROBIC AND ANAEROBIC Blood Culture results may not be optimal due to an inadequate volume of blood received in culture bottles Performed at [facility] [address] [place], [place] [address]    Culture   Final    NO GROWTH 5 DAYS Performed at [facility], [place] [address] [place], [place] [address]    Report Status 04/24/2019 FINAL  Final  Urine culture     Status: None   Collection Time: 04/19/19  3:13 PM   Specimen: In/Out Cath Urine  Result Value Ref Range Status   Specimen Description   Final    IN/OUT CATH URINE Performed at [facility] [address] [place], [place] [address]    Special Requests   Final    NONE Performed at [facility], [place] [address], [place], [place] [address]    Culture   Final    Multiple bacterial morphotypes present, none predominant. Suggest appropriate recollection if clinically indicated.   Report Status 04/20/2019 FINAL  Final  Culture, sputum-assessment     Status: None   Collection Time: 04/20/19 10:38 AM   Specimen: Sputum  Result Value Ref Range Status   Specimen Description SPUTUM  Final   Special Requests NONE  Final   Sputum evaluation   Final    THIS SPECIMEN IS ACCEPTABLE FOR SPUTUM CULTURE Performed at [facility], [place] [address], [place], [place] [address]    Report Status 04/20/2019 FINAL  Final  Culture, respiratory     Status:  None   Collection Time: 04/20/19 10:38 AM   Specimen: SPU  Result Value Ref Range Status   Specimen Description   Final    SPUTUM Performed at [facility] [address] [place], [place] [address]    Special Requests   Final    NONE Reflexed from [id number] Performed at [facility], [place] [address], [place], [place] [address]    Gram Stain   Final    RARE WBC PRESENT,BOTH PMN AND MONONUCLEAR RARE SQUAMOUS EPITHELIAL CELLS PRESENT FEW GRAM POSITIVE RODS RARE YEAST RARE GRAM NEGATIVE  RODS FEW GRAM POSITIVE COCCI    Culture   Final    FEW Consistent with normal respiratory flora. Performed at [facility], [place] [address], [place], [place] [address]    Report Status 04/22/2019 FINAL  Final  Culture, body fluid-bottle     Status: None   Collection Time: 04/20/19  3:39 PM   Specimen: Fluid  Result Value Ref Range Status   Specimen Description FLUID PLEURAL  Final   Special Requests   Final    BOTTLES DRAWN AEROBIC AND ANAEROBIC Blood Culture adequate volume   Culture   Final    NO GROWTH Performed at [facility], [address], [place], [place] [address]    Report Status 04/23/2019 FINAL  Final  Gram stain     Status: None   Collection Time: 04/20/19  3:39 PM   Specimen: Fluid  Result Value Ref Range Status   Specimen Description FLUID PLEURAL  Final   Special Requests NONE  Final   Gram Stain   Final    RARE WBC PRESENT, PREDOMINANTLY PMN NO ORGANISMS SEEN Performed at [facility], [place] [address], [place],  [address]    Report Status 04/21/2019 FINAL  Final     Studies: Dg Chest Port 1 View  Result Date: 04/28/2019 CLINICAL DATA:  Right-sided pleural effusion with right-sided chest tube. History of lung cancer. EXAM: PORTABLE CHEST 1 VIEW COMPARISON:  04/27/2019; 04/26/2019; chest CT-04/19/2019 FINDINGS: Grossly unchanged cardiac silhouette and mediastinal contours. Stable positioning of support  apparatus with no change to slight increase in size of small potentially partially loculated right-sided pleural effusion. Slight worsening of bibasilar heterogeneous opacities. Ill-defined heterogeneous opacities are again seen with the peripheral aspect of the left mid lung. No pneumothorax. No definite evidence of edema. No acute osseous abnormalities. IMPRESSION: 1.  Stable positioning of support apparatus.  No pneumothorax. 2. Suspected slight increase in size of small right-sided pleural effusion and worsening bibasilar opacities, atelectasis versus infiltrate. Electronically Signed   By: Sandi Mariscal M.D.   On: 04/28/2019 05:59   Dg Chest Port 1 View  Result Date: 04/27/2019 CLINICAL DATA:  Acute respiratory failure. EXAM: PORTABLE CHEST 1 VIEW COMPARISON:  04/26/2019 FINDINGS: Stable position of the right basilar chest tube. Improved aeration at the right lung base suggesting decreased right pleural effusion. Persistent linear and patchy densities at the right lung base. No evidence for pneumothorax. Hazy interstitial densities in the left lung are unchanged. Heart size is within normal limits and stable. The trachea is midline. IMPRESSION: 1. Improved aeration of the right lung base compatible with decreased right pleural fluid. Right chest tube is in a stable position. 2. Negative for pneumothorax. 3. Linear and nodular right basilar densities are nonspecific. Electronically Signed   By: Markus Daft M.D.   On: 04/27/2019 09:29    Scheduled Meds: . budesonide (PULMICORT) nebulizer solution  0.25 mg Nebulization BID  . [START ON 04/29/2019] enoxaparin  60 mg Subcutaneous Q24H  . feeding supplement  1 Container Oral BID BM  . ipratropium-albuterol  3 mL Nebulization TID  . magnesium oxide  400 mg Oral BID  . mouth rinse  15 mL Mouth Rinse BID  . metoprolol tartrate  25 mg Oral BID  . multivitamin with minerals  1 tablet Oral Daily  . polyethylene glycol  17 g Oral Daily  . potassium chloride  SA  20 mEq Oral Daily  . sodium chloride flush  5 mL Intracatheter Q8H  .  sucralfate  1 g Oral TID WC & HS   Continuous Infusions: . sodium chloride 10 mL/hr at 04/28/19 1611  . ampicillin-sulbactam (UNASYN) IV 3 g (04/28/19 1537)    Principal Problem:   Aspiration pneumonia ([place]) Active Problems:   Acute respiratory failure with hypoxia (HCC)   Protein-calorie malnutrition, severe   Pulmonary embolism (HCC)   Pleural effusion, right   PNA (pneumonia)   Malnutrition of moderate degree   Atrial fibrillation with RVR (HCC)   AF (paroxysmal atrial fibrillation) ([place])      Desiree Hane  [facility]

## 2019-04-28 NOTE — Progress Notes (Signed)
Laid pt supine for 15 minutes, turned on left side. Pt did not tolerate turning and is in 10 out of 10 pain at the chest tube insertion site. Administered fentanyl and tylenol and will turn on right side once pain has decreased. Will continue to monitor pain.

## 2019-04-29 ENCOUNTER — Inpatient Hospital Stay (HOSPITAL_COMMUNITY): Payer: Medicaid Other

## 2019-04-29 DIAGNOSIS — Z7189 Other specified counseling: Secondary | ICD-10-CM

## 2019-04-29 DIAGNOSIS — Z515 Encounter for palliative care: Secondary | ICD-10-CM

## 2019-04-29 DIAGNOSIS — E43 Unspecified severe protein-calorie malnutrition: Secondary | ICD-10-CM

## 2019-04-29 LAB — CBC
HCT: 21.7 % — ABNORMAL LOW (ref 39.0–52.0)
Hemoglobin: 7 g/dL — ABNORMAL LOW (ref 13.0–17.0)
MCH: 27 pg (ref 26.0–34.0)
MCHC: 32.3 g/dL (ref 30.0–36.0)
MCV: 83.8 fL (ref 80.0–100.0)
Platelets: 272 10*3/uL (ref 150–400)
RBC: 2.59 MIL/uL — ABNORMAL LOW (ref 4.22–5.81)
RDW: 19.5 % — ABNORMAL HIGH (ref 11.5–15.5)
WBC: 20.1 10*3/uL — ABNORMAL HIGH (ref 4.0–10.5)
nRBC: 0 % (ref 0.0–0.2)

## 2019-04-29 NOTE — Progress Notes (Signed)
PCCM:  1350cc in atrium this AM, up from 300cc yesterday AM when seen so >1L out in 24 hours.  CT chest reviewed from this AM.  Significant improvement of loculated effusion.  There is more consolidation within the base than actual pleural fluid.  He does have fluid that remains within the major fissure.  This will hopefully be reabsorbed.  Leave chest tube to suction today.  If the drainage continues to decrease we can consider removal of chest drain tomorrow.  No plans for additional TPA DNase at this time.  [facility] 04/29/2019 10:00 AM

## 2019-04-29 NOTE — Progress Notes (Signed)
NAME:  Tyler [name], MRN:  [id number], DOB:  02-01-1961, LOS: 63 ADMISSION DATE:  04/19/2019, CONSULTATION DATE:  10/13 REFERRING MD:  [email or web address], CHIEF COMPLAINT:  Recurrent and loculated pleural effusion    Brief History                                                                                                                         58 year old male patient with history of stage III lung cancer currently on chemotherapy, status post 30 cycles of XRT admitted on 10/8 with a working diagnosis of postobstructive pneumonia and parapneumonic right pleural effusion.  Pulmonary asked to evaluate on 10/13 for consideration of possible chest tube and administration of TPA/Pulmozyme                                              Past Medical History   Stage III lung cancer diagnosed July 2020 last chemotherapy received 8/24 carboplatin/paclitaxel (s/p 5 cycles) He is status post 30 fractions of XRT. Most recent hospital admit 9/25 for acute pulmonary emboli with bilateral lower extremity DVTs, placed on Lovenox History of EtOH abuse History of tobacco abuse  Significant Hospital Events   10/8: Admitted with working diagnosis of postobstructive pneumonia and pleural effusion 10/9: Therapeutic and diagnostic thoracentesis performed on the right side yielding 1.2 L of pleural fluid, fluid evaluation was consistent with exudate cytology not sent Cultures negative.  Case discussed with thoracic surgery felt not a candidate for thoracotomy 10/12: Interventional radiology contacted for evaluation of possible Pleurx catheter They felt they felt given loculated findings might be better served with chest tube  10/13: Pulmonary asked to evaluate 10/14 CT guided chest tube placed w/ successful evacuation of loculated pleural collection however still head additional loculated fluid collection on chest x-ray 10/15: TPA and Pulmozyme administered, 580 mL of bloody pleural fluid evacuated 10/16: Remarkable  improvement in chest x-ray with almost complete resolution of pleural fluid collection 10/17: Effusion still present, tpa/DNASE repeated, planned CT   Consults:  Thoracic surgery consulted over phone by internal medicine 10/10 deemed not a thoracic surgery candidate Interventional radiology consulted 10/12 Pulmonary: Consulted 10/13  Procedures:  9/26 thora RT >> neutrophilic transudate Right pleural effusion thoracentesis 10/9:  1.2 L of turbid neutrophilic exudate with low glucose  10/14 CT guided RT perc chest drain 10/15 TPA and Pulmozyme administered intrapleurally  Significant Diagnostic Tests:  CT chest 10/8:Increased right pleural effusion is noted with associated right basilar atelectasis or infiltrate. Stable left midlung and basilar opacity as described above.  Micro Data:   Pleural fluid 10/9: Negative Blood cultures 10/8: 1 out of 2 coag negative staph Sputum culture 10/9: Normal flora  Antimicrobials:  Unasyn 10/8  Interim history/subjective:   Stable overnight.  Greater than 1 L out in 24 hours of his chest tube drainage.  Additional 700 out after  instillation of TPA and dornase yesterday.  Objective   Blood pressure 109/74, pulse 92, temperature 98.8 F (37.1 C), temperature source Oral, resp. rate 16, height 6' 2.02" (1.88 m), weight 43 kg, SpO2 99 %.        Intake/Output Summary (Last 24 hours) at 04/29/2019 0812 Last data filed at 04/29/2019 6579 Gross per 24 hour  Intake 429 ml  Output 1500 ml  Net -1071 ml   Filed Weights   04/28/19 0500 04/29/19 0257 04/29/19 0606  Weight: 42.5 kg 42.6 kg 43 kg    Examination: General: Resting in bed no distress HEENT NCAT, sclera clear, temporalis muscle wasting Pulmonary: Diminished breath sounds, no air leak, right chest drain in place. Cardiac: Regular rate and rhythm, S1-S2 Chest: Loss of supraclavicular fat pad Abdomen: Soft nontender nondistended Extremities: Significant muscle wasting, cachexia  Neuro: Awake alert following commands  Resolved Hospital Problem list     Assessment & Plan:   Stage III recurrent lung cancer Probable post obstructive PNA Exudative parapneumonic effusion, loculated status post pigtail catheter placement and TPA DNase Cancer related malnutrition  Plan Continue Unasyn Continue chest tube at -20 cm water. Continue chest tube flushing protocol Repeat noncontrasted CT of the chest this morning. Once results of this is back we can decide whether not going to repeat TPA DNase again in the afternoon.  Acute PE/DVT- Plan Continue Lovenox  Goals of care: Plan: DNR Appreciate palliative care input   Garner Nash, DO [facility] 04/29/2019 8:12 AM  Personal pager: [phone] If unanswered, please page CCM On-call: [phone]

## 2019-04-29 NOTE — Progress Notes (Signed)
[facility]  PROGRESS NOTE  Tyler [name] EPP:[id number] DOB: [birth year] DOA: 04/19/2019 PCP: Brand Males, MD  Brief History    Tyler [name] is a 58 y.o. year old male with medical history significant for recently diagnosed stage III lung cancer with C7/2020, completed chemotherapy cycles), recent PE/bilateral DVT on Lovenox (diagnosed 9/25), who initially presented to ED on 04/19/2019 with worsening right-sided chest pain and due to no acute findings on  CXR sent home with perocet. He returned later that same day with  Confusion, fever and shortness of breath and admitted for sepsis secondary to pneumonia in setting of malignancy.  Hospital Course:  CT chest showing loculated right pleural effusion concern for parapneumonic effusion given neutrophilic exudate and low glucose in diagnostic studies (therapeutic/diagnostic thoracentesis on 10/9, 1.2 L removed, cultures negative).  Case was discussed with thoracic surgery (10/10) who felt patient recommended no indication for thoracotomy.  Case was discussed with IR for possible Pleurx catheter but given loculated findings recommended pulmonary consultation.  Patient underwent IR guided chest tube and has had TPA/DNAS administered by [facility] during hospital stay. Palliative care was consulted to assist in goals of care due to poor prognosis for overall lung comorbidities.   A & P     Atrial fibrillation, rate controlled.  Briefly required IV diltiazem push, remains rate controlled on oral metoprolol 25 mg twice daily, closely monitor.  TSH within normal limit.  Goal K greater than 4, magnesium greater than 2,   Loculated right pleural effusion, concern for parapneumonic effusion, improving.  CT chest 10/18 shows improvement in loculated effusion, no plans for TPA/DNase by PCCM today, continue chest tube to suction, they will continue to monitor to determine when it can be removed.   continue empiric antibiotics for at least 10 days per  pulmonary recommendations. Goals of care discussion by pulm--> now DNR. Very poor prognosis, palliative consulted--recommending residential hospice   Pneumonia, most likely postobstructive.  Stable on 4 L O2, no worsening respiratory effort.  Continue Unasyn for total of 10 days (today day 10) remains afebrile, WBC continues elevated currently at 20, blood cultures unremarkable   Pulmonary embolism/bilateral DVTs, malignancy related.  Lovenox, appreciate pharmacology help   Acute on chronic hypoxic respiratory failure, stable.  Currently on 4 L has been stable for the past 24 hours.  Multifactorial etiology due to above.  See if able to wean O2 with chest tube placement, continue antibiotics.   Acute on chronic normocytic anemia.  Iron panel consistent with anemia of chronic disease likely from malignancy.  Baseline hemoglobin, 11-12, has ranged 8-9 during hospital stay, currently 7, no active signs of bleeding.  Transfuse to maintain goal hemoglobin greater than 7.   Hypokalemia/hypomagnesemia, resolved.  Corrected with repletion.    Stage III non-small lung cancer, diagnosed 01/2019.  Patient has evidence of disease progression despite chemoradiation treatment. Dr. [name] made aware of patient's status by admitting physician. [place] discussion with palliative to assist   COPD.  No wheezing.  No worsening respiratory effort.  Continue Incruse.     Reflux, stable.  Continue Carafate   Malnutrition, moderate, likely in the setting of malignancy.  Dietary consulted, following nutritional supplementation recommendations.      DVT prophylaxis: Lovenox Code Status: DNR Family Communication: No family at bedside, updated wife on phone, 10/17 Disposition Plan: Monitor chest tube drainage, monitor hemoglobin, , poor prognosis      [facility] Direct contact: see [email or web address] (further directions at bottom of note if needed) 7PM-7AM  contact night coverage as at bottom of  note 04/29/2019, 12:12 PM  LOS: 10 days   Consultants   IR, pulmonology  Procedures   IR guided chest tube placement ((right-sided), 10/14  Therapeutic/diagnostic thoracentesis 10/9  Antibiotics   Unasyn 10/8--  Cefepime 10/8  Interval History/Subjective  Stable breathing Just returned from CT chest imaging  Objective   Vitals:  Vitals:   04/29/19 0819 04/29/19 1027  BP:    Pulse:  99  Resp:    Temp:    SpO2: 99%     Exam:  Awake Alert, oriented, frail/cachectic African-American male, no distress Symmetrical Chest wall movement, diminished breath sounds throughout, normal respiratory effort on 4 L O2, no increased work of breathing Chest tube in place Irregularly irregular rhythm, normal rate  Abd Soft, No tenderness,  No edema,   I have personally reviewed the following:   Data Reviewed: Basic Metabolic Panel: Recent Labs  Lab 04/23/19 0530 04/25/19 0646 04/26/19 0531 04/27/19 0533  NA 136 137 134* 136  K 3.6 3.1* 3.3* 4.1  CL 97* 99 97* 99  CO2 26 30 29 28   GLUCOSE 90 104* 93 108*  BUN 11 8 8 10   CREATININE 0.47* 0.41* 0.32* 0.47*  CALCIUM 8.2* 7.6* 7.4* 8.1*  MG  --  1.5* 1.7  --    Liver Function Tests: No results for input(s): AST, ALT, ALKPHOS, BILITOT, PROT, ALBUMIN in the last 168 hours. No results for input(s): LIPASE, AMYLASE in the last 168 hours. No results for input(s): AMMONIA in the last 168 hours. CBC: Recent Labs  Lab 04/23/19 0530 04/25/19 0646 04/26/19 0531 04/27/19 0533 04/28/19 0533 04/29/19 0542  WBC 19.0* 12.9* 13.5* 15.8* 17.4* 20.1*  NEUTROABS 15.8* 10.6*  --   --   --   --   HGB 9.2* 7.7* 8.1* 8.0* 7.3* 7.0*  HCT 28.6* 24.4* 25.9* 25.6* 23.0* 21.7*  MCV 83.6 85.9 85.8 87.7 84.2 83.8  PLT 254 211 208 243 236 272   Cardiac Enzymes: No results for input(s): CKTOTAL, CKMB, CKMBINDEX, TROPONINI in the last 168 hours. BNP (last 3 results) Recent Labs    04/06/19 2217 04/19/19 1242  BNP 251.3* 136.6*     ProBNP (last 3 results) No results for input(s): PROBNP in the last 8760 hours.  CBG: No results for input(s): GLUCAP in the last 168 hours.  Recent Results (from the past 240 hour(s))  Blood Culture (routine x 2)     Status: Abnormal   Collection Time: 04/19/19 12:42 PM   Specimen: BLOOD RIGHT FOREARM  Result Value Ref Range Status   Specimen Description   Final    BLOOD RIGHT FOREARM Performed at [facility] [address], [place], [place] [address]    Special Requests   Final    BOTTLES DRAWN AEROBIC AND ANAEROBIC Blood Culture adequate volume Performed at [facility] [address], [place], [place] [address]    Culture  Setup Time   Final    GRAM POSITIVE COCCI AEROBIC BOTTLE ONLY CRITICAL RESULT CALLED TO, READ BACK BY AND VERIFIED WITH: Melodye Ped PharmD 10:35 04/20/19 ([email or web address])    Culture (A)  Final    STAPHYLOCOCCUS SPECIES (COAGULASE NEGATIVE) THE SIGNIFICANCE OF ISOLATING THIS ORGANISM FROM A SINGLE SET OF BLOOD CULTURES WHEN MULTIPLE SETS ARE DRAWN IS UNCERTAIN. PLEASE NOTIFY THE MICROBIOLOGY DEPARTMENT WITHIN ONE WEEK IF SPECIATION AND SENSITIVITIES ARE REQUIRED. Performed at [facility], [place]  [address] [place], [place] [address]  Report Status 04/21/2019 FINAL  Final  Blood Culture ID Panel (Reflexed)     Status: Abnormal   Collection Time: 04/19/19 12:42 PM  Result Value Ref Range Status   Enterococcus species NOT DETECTED NOT DETECTED Final   Listeria monocytogenes NOT DETECTED NOT DETECTED Final   Staphylococcus species DETECTED (A) NOT DETECTED Final    Comment: Methicillin (oxacillin) susceptible coagulase negative staphylococcus. Possible blood culture contaminant (unless isolated from more than one blood culture draw or clinical case suggests pathogenicity). No antibiotic treatment is indicated for blood  culture contaminants. CRITICAL RESULT CALLED TO, READ BACK BY AND VERIFIED WITH: Melodye Ped PharmD  10:35 04/20/19 ([email or web address])    Staphylococcus aureus (BCID) NOT DETECTED NOT DETECTED Final   Methicillin resistance NOT DETECTED NOT DETECTED Final   Streptococcus species NOT DETECTED NOT DETECTED Final   Streptococcus agalactiae NOT DETECTED NOT DETECTED Final   Streptococcus pneumoniae NOT DETECTED NOT DETECTED Final   Streptococcus pyogenes NOT DETECTED NOT DETECTED Final   Acinetobacter baumannii NOT DETECTED NOT DETECTED Final   Enterobacteriaceae species NOT DETECTED NOT DETECTED Final   Enterobacter cloacae complex NOT DETECTED NOT DETECTED Final   Escherichia coli NOT DETECTED NOT DETECTED Final   Klebsiella oxytoca NOT DETECTED NOT DETECTED Final   Klebsiella pneumoniae NOT DETECTED NOT DETECTED Final   Proteus species NOT DETECTED NOT DETECTED Final   Serratia marcescens NOT DETECTED NOT DETECTED Final   Haemophilus influenzae NOT DETECTED NOT DETECTED Final   Neisseria meningitidis NOT DETECTED NOT DETECTED Final   Pseudomonas aeruginosa NOT DETECTED NOT DETECTED Final   Candida albicans NOT DETECTED NOT DETECTED Final   Candida glabrata NOT DETECTED NOT DETECTED Final   Candida krusei NOT DETECTED NOT DETECTED Final   Candida parapsilosis NOT DETECTED NOT DETECTED Final   Candida tropicalis NOT DETECTED NOT DETECTED Final    Comment: Performed at [facility], [address], [place], [place] [address]  SARS Coronavirus 2 by RT PCR (hospital order, performed in [facility] hospital lab) Nasopharyngeal Nasopharyngeal Swab     Status: None   Collection Time: 04/19/19  2:13 PM   Specimen: Nasopharyngeal Swab  Result Value Ref Range Status   SARS Coronavirus 2 NEGATIVE NEGATIVE Final    Comment: (NOTE) If result is NEGATIVE SARS-CoV-2 target nucleic acids are NOT DETECTED. The SARS-CoV-2 RNA is generally detectable in upper and lower  respiratory specimens during the acute phase of infection. The lowest  concentration of SARS-CoV-2 viral copies this assay can detect is  250  copies / mL. A negative result does not preclude SARS-CoV-2 infection  and should not be used as the sole basis for treatment or other  patient management decisions.  A negative result may occur with  improper specimen collection / handling, submission of specimen other  than nasopharyngeal swab, presence of viral mutation(s) within the  areas targeted by this assay, and inadequate number of viral copies  (<250 copies / mL). A negative result must be combined with clinical  observations, patient history, and epidemiological information. If result is POSITIVE SARS-CoV-2 target nucleic acids are DETECTED. The SARS-CoV-2 RNA is generally detectable in upper and lower  respiratory specimens dur ing the acute phase of infection.  Positive  results are indicative of active infection with SARS-CoV-2.  Clinical  correlation with patient history and other diagnostic information is  necessary to determine patient infection status.  Positive results do  not rule out bacterial infection or co-infection with other viruses. If result is PRESUMPTIVE  POSTIVE SARS-CoV-2 nucleic acids MAY BE PRESENT.   A presumptive positive result was obtained on the submitted specimen  and confirmed on repeat testing.  While 2019 novel coronavirus  (SARS-CoV-2) nucleic acids may be present in the submitted sample  additional confirmatory testing may be necessary for epidemiological  and / or clinical management purposes  to differentiate between  SARS-CoV-2 and other Sarbecovirus currently known to infect humans.  If clinically indicated additional testing with an alternate test  methodology [phone]) is advised. The SARS-CoV-2 RNA is generally  detectable in upper and lower respiratory sp ecimens during the acute  phase of infection. The expected result is Negative. Fact Sheet for Patients:  StrictlyIdeas.no Fact Sheet for Healthcare  Providers: BankingDealers.co.za This test is not yet approved or cleared by the [place] FDA and has been authorized for detection and/or diagnosis of SARS-CoV-2 by FDA under an Emergency Use Authorization (EUA).  This EUA will remain in effect (meaning this test can be used) for the duration of the COVID-19 declaration under Section 564(b)(1) of the Act, 21 U.S.C. section 360bbb-3(b)(1), unless the authorization is terminated or revoked sooner. Performed at [facility], [place] [address] [place], [place] [address]   Culture, blood (Routine X 2) w Reflex to ID Panel     Status: None   Collection Time: 04/19/19  3:00 PM   Specimen: BLOOD  Result Value Ref Range Status   Specimen Description   Final    BLOOD RIGHT HAND Performed at [facility] [address] [place], [place] [address]    Special Requests   Final    BOTTLES DRAWN AEROBIC AND ANAEROBIC Blood Culture results may not be optimal due to an inadequate volume of blood received in culture bottles Performed at [facility] [address] [place], [place] [address]    Culture   Final    NO GROWTH 5 DAYS Performed at [facility], [place] [address] [place], [place] [address]    Report Status 04/24/2019 FINAL  Final  Urine culture     Status: None   Collection Time: 04/19/19  3:13 PM   Specimen: In/Out Cath Urine  Result Value Ref Range Status   Specimen Description   Final    IN/OUT CATH URINE Performed at [facility] [address], [place], [place] [address]    Special Requests   Final    NONE Performed at [facility], [place] [address], [place], [place] [address]    Culture   Final    Multiple bacterial morphotypes present, none predominant. Suggest appropriate recollection if clinically indicated.   Report Status 04/20/2019 FINAL  Final  Culture, sputum-assessment     Status: None    Collection Time: 04/20/19 10:38 AM   Specimen: Sputum  Result Value Ref Range Status   Specimen Description SPUTUM  Final   Special Requests NONE  Final   Sputum evaluation   Final    THIS SPECIMEN IS ACCEPTABLE FOR SPUTUM CULTURE Performed at [facility], [place] [address], [place], [place] [address]    Report Status 04/20/2019 FINAL  Final  Culture, respiratory     Status: None   Collection Time: 04/20/19 10:38 AM   Specimen: SPU  Result Value Ref Range Status   Specimen Description   Final    SPUTUM Performed at [facility] [address], [place], [place] [address]    Special Requests   Final  NONE Reflexed from [phone] Performed at [facility], [place] [address], [place], [place] [address]    Gram Stain   Final    RARE WBC PRESENT,BOTH PMN AND MONONUCLEAR RARE SQUAMOUS EPITHELIAL CELLS PRESENT FEW GRAM POSITIVE RODS RARE YEAST RARE GRAM NEGATIVE RODS FEW GRAM POSITIVE COCCI    Culture   Final    FEW Consistent with normal respiratory flora. Performed at [facility], [place] [address], [place], [place] [address]    Report Status 04/22/2019 FINAL  Final  Culture, body fluid-bottle     Status: None   Collection Time: 04/20/19  3:39 PM   Specimen: Fluid  Result Value Ref Range Status   Specimen Description FLUID PLEURAL  Final   Special Requests   Final    BOTTLES DRAWN AEROBIC AND ANAEROBIC Blood Culture adequate volume   Culture   Final    NO GROWTH Performed at [facility], [address], [place], [place] [address]    Report Status 04/23/2019 FINAL  Final  Gram stain     Status: None   Collection Time: 04/20/19  3:39 PM   Specimen: Fluid  Result Value Ref Range Status   Specimen Description FLUID PLEURAL  Final   Special Requests NONE  Final   Gram Stain   Final    RARE WBC PRESENT, PREDOMINANTLY PMN NO ORGANISMS SEEN Performed at [facility], [place] [address] [place], [place]  [address]    Report Status 04/21/2019 FINAL  Final     Studies: Ct Chest Wo Contrast  Result Date: 04/29/2019 CLINICAL DATA:  Evaluate pleural effusion. History of lung cancer and previous right-sided chest tube placement. EXAM: CT CHEST WITHOUT CONTRAST TECHNIQUE: Multidetector CT imaging of the chest was performed following the standard protocol without IV contrast. COMPARISON:  Chest CT-04/19/2019; 04/06/2019 FINDINGS: Cardiovascular: Borderline cardiomegaly. Coronary artery calcifications. No pericardial effusion. No evidence of thoracic aortic aneurysm. Atherosclerotic plaque involving the origin of the branch vessels of the aortic arch. No intramural hematoma. Mediastinum/Nodes: Evaluation for mediastinal and hilar adenopathy is degraded secondary lack of intravenous contrast. Lungs/Pleura: Stable positioning of right-sided chest tube with significant reduction of previously noted partially loculated right-sided pleural effusion. A small amount of fluid remains within the right major fissure. A small pneumothorax is seen about the anterior basilar aspect of the right hemithorax (image 33, series 7, potentially ex vacuo in etiology. Improved aeration of the right upper lobe however there are persistent air bronchograms about the right hilum and occlusion the right lower lobe bronchus with associated persistent near complete atelectasis/collapse of the right lower lobe. Unchanged approximately 0.8 cm right upper lobe nodule, similar to the 03/2019 examination and potentially representative of a intraparenchymal metastasis. Interval development of a trace left-sided pleural effusion with worsening interstitial opacities within the left lung and consolidative opacities associated air bronchograms within the left lower lobe. Upper Abdomen: Limited noncontrast the superior aspect of the right-sided nephrolithiasis which evaluation of the upper abdomen demonstrates measures at least 0.4 cm in diameter (image  167, series 2). Musculoskeletal: No acute or aggressive osseous abnormalities. Regional soft tissues appear normal. Normal appearance of the thyroid gland. IMPRESSION: 1. Interval reduction in size loculated right-sided pleural effusion post chest tube placement with improved aeration of the right upper lobe and tiny right-sided pneumothorax, potentially ex vacuo in etiology. A small amount of fluid remains within the right major fissure. 2. Redemonstrated occlusion of the right lower lobe bronchus with associated near complete  atelectasis/collapse of the right lower lobe likely attributable to provided history of lung cancer though a definitive parenchymal masses difficult to define secondary to consolidation and volume loss. 3. Interval development of a small left-sided effusion with worsening left basilar consolidative opacities, air bronchograms and interstitial thickening within the left lung, potentially representative asymmetric pulmonary edema though underlying infection is not excluded on the basis of this examination. Clinical correlation is advised. 4. Cardiomegaly. 5. Coronary calcifications.  Aortic Atherosclerosis (ICD10-I70.0). Electronically Signed   By: Sandi Mariscal M.D.   On: 04/29/2019 09:42   Dg Chest Port 1 View  Result Date: 04/28/2019 CLINICAL DATA:  Right-sided pleural effusion with right-sided chest tube. History of lung cancer. EXAM: PORTABLE CHEST 1 VIEW COMPARISON:  04/27/2019; 04/26/2019; chest CT-04/19/2019 FINDINGS: Grossly unchanged cardiac silhouette and mediastinal contours. Stable positioning of support apparatus with no change to slight increase in size of small potentially partially loculated right-sided pleural effusion. Slight worsening of bibasilar heterogeneous opacities. Ill-defined heterogeneous opacities are again seen with the peripheral aspect of the left mid lung. No pneumothorax. No definite evidence of edema. No acute osseous abnormalities. IMPRESSION: 1.  Stable  positioning of support apparatus.  No pneumothorax. 2. Suspected slight increase in size of small right-sided pleural effusion and worsening bibasilar opacities, atelectasis versus infiltrate. Electronically Signed   By: Sandi Mariscal M.D.   On: 04/28/2019 05:59    Scheduled Meds:  budesonide (PULMICORT) nebulizer solution  0.25 mg Nebulization BID   enoxaparin  60 mg Subcutaneous Q24H   feeding supplement  1 Container Oral BID BM   ipratropium-albuterol  3 mL Nebulization TID   magnesium oxide  400 mg Oral BID   mouth rinse  15 mL Mouth Rinse BID   metoprolol tartrate  25 mg Oral BID   multivitamin with minerals  1 tablet Oral Daily   polyethylene glycol  17 g Oral Daily   potassium chloride SA  20 mEq Oral Daily   sodium chloride flush  5 mL Intracatheter Q8H   sucralfate  1 g Oral TID WC & HS   Continuous Infusions:  sodium chloride 10 mL/hr at 04/28/19 1611   ampicillin-sulbactam (UNASYN) IV 3 g (04/29/19 0939)    Principal Problem:   Aspiration pneumonia (HCC) Active Problems:   Acute respiratory failure with hypoxia (HCC)   Protein-calorie malnutrition, severe   Pulmonary embolism (HCC)   Pleural effusion, right   PNA (pneumonia)   Malnutrition of moderate degree   Atrial fibrillation with RVR (HCC)   AF (paroxysmal atrial fibrillation) ([place])      Desiree Hane  [facility]

## 2019-04-29 NOTE — Consult Note (Signed)
Consultation Note Date: 04/29/2019   Patient Name: Tyler [name]  DOB: 1960/10/28  MRN: [id number]  Age / Sex: 58 y.o., male  PCP: Brand Males, MD Referring Physician: Desiree Hane, MD  Reason for Consultation: Establishing goals of care  HPI/Patient Profile: 58 y.o. male  admitted on 04/19/2019  58 yo gentleman with history of ETOH and tobacco use, was diagnosed with lung cancer in July 2020, s/p radiation, chemotherapy, also diagnosed with DVT PE last month, admitted with post obstructive PNA, pleural effusion. Patient being followed closely by PCCM, also by IR, remains admitted to hospital medicine service.  In this hospitalization, he is s/p thoracentesis R side, CT guded chest tube placement and instillation of tpa/DNASE. Patient with ongoing drainage from chest tube, CT chest repeated.   Clinical Assessment and Goals of Care: A palliative consult has been requested for ongoing goals of care discussions.   Mr Appleyard discussed with his pulmonologist Dr Valeta Harms in detail on 04-28-2019 regarding his current condition. At that time, he elected for DNR.    Mr Berry is resting in bed, watching television. There is no family at the bedside. I introduced myself, nature of my visit, palliative care, goals of care discussions as follows:  Palliative medicine is specialized medical care for people living with serious illness. It focuses on providing relief from the symptoms and stress of a serious illness. The goal is to improve quality of life for both the patient and the family.  Goals of care: Broad aims of medical therapy in relation to the patient's values and preferences. Our aim is to provide medical care aimed at enabling patients to achieve the goals that matter most to them, given the circumstances of their particular medical situation and their constraints.   Mr Adelson states that he is  having a hard time with the chest tube, he has just returned from CT chest. We discussed about his pain and non pain symptom management, reviewed his current condition. Goals, wishes and values important to the patient and his family attempted to be explored.   Call placed and discussed with his wife. She is afraid that he is "giving up". Discussed again with her in detail about DNR DNI, and overall goals of care. We talked about prognosis. Offered active listening and supportive care to her, as much as is possible over the phone. She is asking if more than one family member may visit, since they share a ride, I will make this request of nursing staff.   I discussed with wife about continuing current measures, addressing symptoms and then figuring out most appropriate disposition. She is in agreement, see below.   NEXT OF KIN  wife Mrs Gaede, lives at home with family.   SUMMARY OF RECOMMENDATIONS    Agree with DNR DNI Continue current mode of care, PMT to follow hospital course and disease trajectory. Agree with current pain and non pain symptom management While patient did not engage much, call placed and discussed in detail with wife Mrs Keena over  the phone: we reviewed his decision for DNR DNI (she agrees completely), we also talked about hospice, specifically residential hospice being a very appropriate option towards the end of this hospitalization, and she also asked if out of state relatives need to be notified of the patient's current condition (to which I said yes).  Thank you for the consult, PMT to follow.  Code Status/Advance Care Planning:  DNR    Symptom Management:    Continue current measures: he is on Fentanyl IV PRN as well as PO Oxy IR.    Palliative Prophylaxis:   Delirium Protocol   Psycho-social/Spiritual:   Desire for further Chaplaincy support:yes  Additional Recommendations: Education on Hospice  Prognosis:   Guarded, ?could be few weeks?   Discharge Planning: To Be Determined, he may end up qualifying for residential hospice, depending on his hospital course. PMT to follow.       Primary Diagnoses: Present on Admission: . Acute respiratory failure with hypoxia ([place]) . Pulmonary embolism ([place]) . Protein-calorie malnutrition, severe . PNA (pneumonia) . Pleural effusion, right   I have reviewed the medical record, interviewed the patient and family, and examined the patient. The following aspects are pertinent.  Past Medical History:  Diagnosis Date  . Alcohol abuse   . Chronic back pain   . COPD (chronic obstructive pulmonary disease) ([place])   . Dyspnea   . Lung mass    mediastinal adenopathy  . Lung nodule   . Multiple pulmonary nodules 04/02/2018  . Pneumonia   . Right lower lobe pulmonary nodule 04/02/2018   12/01/2017: PET SUV 2.1  . Tobacco abuse    Social History   Socioeconomic History  . Marital status: Married    Spouse name: Not on file  . Number of children: Not on file  . Years of education: Not on file  . Highest education level: Not on file  Occupational History  . Not on file  Social Needs  . Financial resource strain: Not on file  . Food insecurity    Worry: Not on file    Inability: Not on file  . Transportation needs    Medical: Yes    Non-medical: Yes  Tobacco Use  . Smoking status: Current Every Day Smoker    Packs/day: 0.25    Years: 40.00    Pack years: 10.00    Types: Cigarettes  . Smokeless tobacco: Never Used  Substance and Sexual Activity  . Alcohol use: Not Currently  . Drug use: Not Currently    Types: Marijuana  . Sexual activity: Not on file  Lifestyle  . Physical activity    Days per week: Not on file    Minutes per session: Not on file  . Stress: Not on file  Relationships  . Social Herbalist on phone: Not on file    Gets together: Not on file    Attends religious service: Not on file    Active member of club or organization: Not on file     Attends meetings of clubs or organizations: Not on file    Relationship status: Not on file  Other Topics Concern  . Not on file  Social History Narrative  . Not on file   Family History  Problem Relation Age of Onset  . Heart attack Maternal Grandfather    Scheduled Meds: . budesonide (PULMICORT) nebulizer solution  0.25 mg Nebulization BID  . enoxaparin  60 mg Subcutaneous Q24H  . feeding supplement  1  Container Oral BID BM  . ipratropium-albuterol  3 mL Nebulization TID  . magnesium oxide  400 mg Oral BID  . mouth rinse  15 mL Mouth Rinse BID  . metoprolol tartrate  25 mg Oral BID  . multivitamin with minerals  1 tablet Oral Daily  . polyethylene glycol  17 g Oral Daily  . potassium chloride SA  20 mEq Oral Daily  . sodium chloride flush  5 mL Intracatheter Q8H  . sucralfate  1 g Oral TID WC & HS   Continuous Infusions: . sodium chloride 10 mL/hr at 04/28/19 1611  . ampicillin-sulbactam (UNASYN) IV 3 g (04/29/19 0939)   PRN Meds:.acetaminophen, fentaNYL (SUBLIMAZE) injection, ipratropium-albuterol, metoprolol tartrate, oxyCODONE Medications Prior to Admission:  Prior to Admission medications   Medication Sig Start Date End Date Taking? Authorizing Provider  albuterol (PROVENTIL HFA;VENTOLIN HFA) 108 (90 Base) MCG/ACT inhaler Inhale 2 puffs into the lungs every 6 (six) hours as needed for wheezing or shortness of breath. 06/27/18  Yes Icard, Bradley L, DO  enoxaparin (LOVENOX) 100 MG/ML injection Inject 0.9 mLs (90 mg total) into the skin daily. 04/11/19 05/11/19 Yes GhimireDante Gang, MD  feeding supplement, ENSURE ENLIVE, (ENSURE ENLIVE) LIQD Take 237 mLs by mouth 3 (three) times daily between meals. Patient taking differently: Take 237 mLs by mouth daily.  08/11/17  Yes Ghimire, Henreitta Leber, MD  ibuprofen (ADVIL) 200 MG tablet Take 400 mg by mouth every 6 (six) hours as needed for fever, headache or moderate pain.   Yes [provider]  Magnesium Oxide 400 MG CAPS Take 1  capsule (400 mg total) by mouth 2 (two) times daily. 04/10/19 05/10/19 Yes Ghimire, Dante Gang, MD  potassium chloride SA (K-DUR) 20 MEQ tablet Take 1 tablet (20 mEq total) by mouth daily. 04/06/19  Yes Tish Men, MD  sucralfate (CARAFATE) 1 g tablet Take 1 g by mouth 4 (four) times daily -  with meals and at bedtime.   Yes [provider]  tiotropium (SPIRIVA HANDIHALER) 18 MCG inhalation capsule Place 1 capsule (18 mcg total) into inhaler and inhale daily. 06/27/18 01/05/20 Yes Icard, Octavio Graves, DO  lidocaine (XYLOCAINE) 2 % solution 5 ml swallow before meals for radiation esophagitis Patient taking differently: 5 ml swallow before meals for radiation esophagitis as needed to numb 03/30/19   [name], MD  oxyCODONE-acetaminophen (PERCOCET) 5-325 MG tablet Take 1-2 tablets by mouth every 4 (four) hours as needed for moderate pain or severe pain. 04/19/19   Orpah Greek, MD   No Known Allergies Review of Systems Pain in chest at tube site  Physical Exam Weak appearing gentleman Resting in bed Diminished breath sounds R chest drain S1 S2 Muscle wasting evident Awake alert, non focal  Vital Signs: BP 109/74 (BP Location: Left Arm)   Pulse 99   Temp 98.8 F (37.1 C) (Oral)   Resp 16   Ht 6' 2.02" (1.88 m)   Wt 43 kg   SpO2 99%   BMI 12.17 kg/m  Pain Scale: 0-10 POSS *See Group Information*: 1-Acceptable,Awake and alert Pain Score: 6    SpO2: SpO2: 99 % O2 Device:SpO2: 99 % O2 Flow Rate: .O2 Flow Rate (L/min): 4 L/min  IO: Intake/output summary:   Intake/Output Summary (Last 24 hours) at 04/29/2019 1237 Last data filed at 04/29/2019 0800 Gross per 24 hour  Intake 429 ml  Output 1640 ml  Net -1211 ml    LBM: Last BM Date: 04/25/19 Baseline Weight: Weight: 59 kg Most recent  weight: Weight: 43 kg     Palliative Assessment/Data:   PPS 40%  Time In:  10 Time Out:  11.10 Time Total:  70 min  Greater than 50%  of this time was spent counseling and  coordinating care related to the above assessment and plan.  Signed by: Loistine Chance, MD  [phone] Please contact Palliative Medicine Team phone at [phone] for questions and concerns.  For individual provider: See Shea Evans

## 2019-04-30 DIAGNOSIS — Z515 Encounter for palliative care: Secondary | ICD-10-CM

## 2019-04-30 DIAGNOSIS — Z7189 Other specified counseling: Secondary | ICD-10-CM

## 2019-04-30 DIAGNOSIS — R0602 Shortness of breath: Secondary | ICD-10-CM

## 2019-04-30 LAB — BASIC METABOLIC PANEL
Anion gap: 8 (ref 5–15)
BUN: 6 mg/dL (ref 6–20)
CO2: 29 mmol/L (ref 22–32)
Calcium: 7.9 mg/dL — ABNORMAL LOW (ref 8.9–10.3)
Chloride: 99 mmol/L (ref 98–111)
Creatinine, Ser: 0.37 mg/dL — ABNORMAL LOW (ref 0.61–1.24)
GFR calc Af Amer: >60 mL/min (ref >60)
GFR calc non Af Amer: >60 mL/min (ref >60)
Glucose, Bld: 91 mg/dL (ref 70–99)
Potassium: 4.1 mmol/L (ref 3.5–5.1)
Sodium: 136 mmol/L (ref 135–145)

## 2019-04-30 LAB — CBC
HCT: 25.8 % — ABNORMAL LOW (ref 39.0–52.0)
Hemoglobin: 8 g/dL — ABNORMAL LOW (ref 13.0–17.0)
MCH: 26.8 pg (ref 26.0–34.0)
MCHC: 31 g/dL (ref 30.0–36.0)
MCV: 86.3 fL (ref 80.0–100.0)
Platelets: 270 10*3/uL (ref 150–400)
RBC: 2.99 MIL/uL — ABNORMAL LOW (ref 4.22–5.81)
RDW: 19.9 % — ABNORMAL HIGH (ref 11.5–15.5)
WBC: 13.9 10*3/uL — ABNORMAL HIGH (ref 4.0–10.5)
nRBC: 0 % (ref 0.0–0.2)

## 2019-04-30 LAB — MAGNESIUM: Magnesium: 1.6 mg/dL — ABNORMAL LOW (ref 1.7–2.4)

## 2019-04-30 MED ORDER — AMOXICILLIN-POT CLAVULANATE 875-125 MG PO TABS
1.0000 | ORAL_TABLET | Freq: Two times a day (BID) | ORAL | Status: DC
  Administered 2019-04-30 – 2019-05-02 (×4): 1 via ORAL
  Filled 2019-04-30 (×4): qty 1

## 2019-04-30 MED ORDER — MAGNESIUM SULFATE IN D5W 1-5 GM/100ML-% IV SOLN
1.0000 g | INTRAVENOUS | Status: AC
  Administered 2019-04-30 (×2): 1 g via INTRAVENOUS
  Filled 2019-04-30 (×2): qty 100

## 2019-04-30 NOTE — TOC Initial Note (Signed)
Transition of Care [facility]) - Initial/Assessment Note    Patient Details  Name: Tyler [name] MRN: [id number] Date of Birth: 1961/04/22  Transition of Care [facility]) CM/SW Contact:    Nila Nephew, LCSW Phone Number: [phone] 04/30/2019, 1:38 PM  Clinical Narrative:    Pt referred for hospice care following family meeting with PMT this morning- elected for comfort measures.  Seeking residential hospice due to prognosis and burden of symptoms.  Referred to [facility] team will follow to assist.                 Patient Goals and CMS Choice    [facility]     PActivities of Daily Living Home Assistive Devices/Equipment: Cane (specify quad or straight)(single point cane) ADL Screening (condition at time of admission) Patient's cognitive ability adequate to safely complete daily activities?: Yes Is the patient deaf or have difficulty hearing?: No Does the patient have difficulty seeing, even when wearing glasses/contacts?: No Does the patient have difficulty concentrating, remembering, or making decisions?: No Patient able to express need for assistance with ADLs?: Yes Does the patient have difficulty dressing or bathing?: No Independently performs ADLs?: No Communication: Independent Dressing (OT): Independent Is this a change from baseline?: Pre-admission baseline Grooming: Independent Is this a change from baseline?: Pre-admission baseline Feeding: Independent Bathing: Independent Toileting: Independent with device (comment) In/Out Bed: Independent Walks in Home: Independent with device (comment) Does the patient have difficulty walking or climbing stairs?: Yes(secondary to shortness of breath and weakness) Weakness of Legs: Both Weakness of Arms/Hands: None  Permission Sought/Granted                  Emotional Assessment              Admission diagnosis:  Sepsis, due to unspecified organism, unspecified whether acute organ dysfunction present  [facility]) [A41.9] Patient Active Problem List   Diagnosis Date Noted  . AF (paroxysmal atrial fibrillation) ([place]) 04/27/2019  . Atrial fibrillation with RVR ([place]) 04/25/2019  . Malnutrition of moderate degree 04/21/2019  . Aspiration pneumonia ([place]) 04/19/2019  . PNA (pneumonia) 04/19/2019  . Loculated pleural effusion   . Acute pulmonary embolism ([place]) 04/07/2019  . Pulmonary embolism ([place]) 04/06/2019  . Anemia 04/06/2019  . Hypokalemia 04/06/2019  . Malignant neoplasm of bronchus of right lower lobe ([place]) 03/01/2019  . Adenocarcinoma of right lung, stage 3 ([place]) 01/17/2019  . Goals of care, counseling/discussion 01/17/2019  . Encounter for antineoplastic chemotherapy 01/17/2019  . Tobacco abuse 01/10/2019  . Mediastinal adenopathy 01/10/2019  . Right upper lobe pulmonary nodule 04/19/2018  . Multiple pulmonary nodules 04/02/2018  . Right lower lobe pulmonary nodule 04/02/2018  . Protein-calorie malnutrition, severe 08/09/2017  . Acute lung injury   . Pneumonitis   . BOOP (bronchiolitis obliterans with organizing pneumonia) ([place])   . Acute respiratory failure with hypoxia ([place]) 08/08/2017  . Hypoxia 08/07/2017  . Dehydration   . Diarrhea   . CAP (community acquired pneumonia) 08/04/2017  . Hyponatremia 08/04/2017  . Tachycardia 08/04/2017  . Influenza A    PCP:  Brand Males, MD Pharmacy:   [id number] - [place], [place] [address] Phone: [phone] Fax: [place], [place] [address] [place] [address] Phone: [phone] Fax: [phone]     Social Determinants of Health (SDOH) Interventions    Readmission Risk Interventions  No flowsheet data found.

## 2019-04-30 NOTE — Progress Notes (Signed)
NAME:  Tyler [name], MRN:  [id number], DOB:  1960/11/27, LOS: 11 ADMISSION DATE:  04/19/2019, CONSULTATION DATE:  10/13 REFERRING MD:  [email or web address], CHIEF COMPLAINT:  Recurrent and loculated pleural effusion    Brief History   58 year old male patient with history of stage III lung cancer currently on chemotherapy, status post 30 cycles of XRT admitted on 10/8 with a working diagnosis of postobstructive pneumonia and parapneumonic right pleural effusion.  Pulmonary asked to evaluate on 10/13 for consideration of possible chest tube and administration of TPA/Pulmozyme                                             Past Medical History  Stage III lung cancer diagnosed July 2020 last chemotherapy received 8/24 carboplatin/paclitaxel (s/p 5 cycles) He is status post 30 fractions of XRT. Most recent hospital admit 9/25 for acute pulmonary emboli with bilateral lower extremity DVTs, placed on Lovenox History of EtOH abuse History of tobacco abuse  Significant Hospital Events   10/8: Admitted with working diagnosis of postobstructive pneumonia and pleural effusion 10/9: Therapeutic and diagnostic thoracentesis performed on the right side yielding 1.2 L of pleural fluid, fluid evaluation was consistent with exudate cytology not sent Cultures negative.  Case discussed with thoracic surgery felt not a candidate for thoracotomy 10/12: Interventional radiology contacted for evaluation of possible Pleurx catheter They felt they felt given loculated findings might be better served with chest tube  10/13: Pulmonary asked to evaluate 10/14 CT guided chest tube placed w/ successful evacuation of loculated pleural collection however still head additional loculated fluid collection on chest x-ray 10/15: TPA and Pulmozyme administered, 580 mL of bloody pleural fluid evacuated 10/16: Remarkable improvement in chest x-ray with almost complete resolution of pleural fluid collection 10/17: Effusion still present,  tpa/DNASE repeated, planned CT  10/19 Chest tube pulled   Consults:  Thoracic surgery consulted over phone by internal medicine 10/10 deemed not a thoracic surgery candidate Interventional radiology consulted 10/12 Pulmonary: Consulted 10/13  Procedures:  9/26 thora RT >> neutrophilic transudate Right pleural effusion thoracentesis 10/9:  1.2 L of turbid neutrophilic exudate with low glucose  10/14 CT guided RT perc chest drain 10/15 TPA and Pulmozyme administered intrapleurally  Significant Diagnostic Tests:  CT chest 10/8:Increased right pleural effusion is noted with associated right basilar atelectasis or infiltrate. Stable left midlung and basilar opacity as described above.  Micro Data:  Pleural fluid 10/9: Negative Blood cultures 10/8: 1 out of 2 coag negative staph Sputum culture 10/9: Normal flora  Antimicrobials:  Unasyn 10/8  Interim history/subjective:  Less than 200 ml drainage out of CT.  Pt pending [employer] transfer.   Objective   Blood pressure 99/64, pulse 83, temperature 98.2 F (36.8 C), temperature source Oral, resp. rate 18, height 6' 2.02" (1.88 m), weight 42.9 kg, SpO2 100 %.        Intake/Output Summary (Last 24 hours) at 04/30/2019 1248 Last data filed at 04/30/2019 0318 Gross per 24 hour  Intake 320 ml  Output 405 ml  Net -85 ml   Filed Weights   04/29/19 0257 04/29/19 0606 04/30/19 0251  Weight: 42.6 kg 43 kg 42.9 kg    Examination: General: cachectic gentleman lying in bed in NAD HEENT: MM pink/moist, no jvd, temporal wasting  Neuro: AAOx4, speech clear, MAE  CV: s1s2 rrr, no m/r/g PULM:  Even/non-labored, clear  anterior, diminished bases GI: soft, bsx4 active  Extremities: warm/dry, no edema  Skin: no rashes or lesions  Left Chest tube removed without difficulty.  Occlusive dressing to site with dry gauze covering.    Resolved Hospital Problem list     Assessment & Plan:   Stage III recurrent lung cancer Probable post  obstructive PNA Exudative parapneumonic effusion, loculated status post pigtail catheter placement and TPA DNase Cancer related malnutrition  Plan Discontinue chest tube Occlusive dressing to cover site  PRN pain control per Palliative Care   Acute PE/DVT- Plan Lovenox per primary   Goals of care: Plan: DNR  Appreciate palliative care, pending transfer to [place] palliative care input   Noe Gens, NP-C [place] Pgr: [phone] or if no answer [phone] 04/30/2019, 12:52 PM

## 2019-04-30 NOTE — Progress Notes (Signed)
[facility]  PROGRESS NOTE  Tyler ADDUCI DJM:[id number] DOB: 04-Feb-1961 DOA: 04/19/2019 PCP: Brand Males, MD  Brief History    Tyler [name] is a 58 y.o. year old male with medical history significant for recently diagnosed stage III lung cancer with C7/2020, completed chemotherapy cycles), recent PE/bilateral DVT on Lovenox (diagnosed 9/25), who initially presented to ED on 04/19/2019 with worsening right-sided chest pain and due to no acute findings on  CXR sent home with perocet. He returned later that same day with  Confusion, fever and shortness of breath and admitted for sepsis secondary to pneumonia in setting of malignancy.  [name] Course:  CT chest showing loculated right pleural effusion concern for parapneumonic effusion given neutrophilic exudate and low glucose in diagnostic studies (therapeutic/diagnostic thoracentesis on 10/9, 1.2 L removed, cultures negative).  Case was discussed with thoracic surgery (10/10) who felt patient recommended no indication for thoracotomy.  Case was discussed with IR for possible Pleurx catheter but given loculated findings recommended pulmonary consultation.  Patient underwent IR guided chest tube and has had TPA/DNAS administered by Tyler [name] during [name] stay. Palliative care was consulted to assist in goals of care due to poor prognosis for overall lung comorbidities.   A & P     Atrial fibrillation, rate controlled.  Briefly required IV diltiazem push, remains rate controlled on oral metoprolol 25 mg twice daily, closely monitor.  TSH within normal limit.  Goal K greater than 4, magnesium greater than 2,   Loculated right pleural effusion, concern for parapneumonic effusion, improving.  CT chest 10/18 shows improvement in loculated effusion, chest tube removed, continue pain control for comfort.  Will transition to Augmentin on discharge continue Unasyn while inpatient  Goals of care discussion by pulm--> now DNR. Very poor  prognosis, palliative consulted--recommending residential hospice, awaiting bed availability by [facility].   Pneumonia, most likely postobstructive.  Stable on 4 L O2, no worsening respiratory effort.  Continue Unasyn for total of 10 days (today day 10) remains afebrile, WBC  downtrending, blood cultures unremarkable, currently on Unasyn, will transition to Augmentin on discharge   Pulmonary embolism/bilateral DVTs, malignancy related.  Lovenox, appreciate pharmacology help   Acute on chronic hypoxic respiratory failure, stable.  Currently on 4 L has been stable for the past 24 hours.  Multifactorial etiology due to above.  See if able to wean O2 with chest tube placement, continue antibiotics.   Acute on chronic normocytic anemia.  Iron panel consistent with anemia of chronic disease likely from malignancy.  Baseline hemoglobin, 11-12, has ranged 8-9 during [name] stay, currently 7, no active signs of bleeding.  Transfuse to maintain goal hemoglobin greater than 7.   Hypokalemia/hypomagnesemia, resolved.  Corrected with repletion.    Stage III non-small lung cancer, diagnosed 01/2019.  Patient has evidence of disease progression despite chemoradiation treatment. Tyler [name] made aware of patient's status by admitting physician.  Discussed over phone with Dr. Lew Dawes NP on 10/19, will come by to evaluate patient on 10/20 to ensure no other recommendations   COPD.  No wheezing.  No worsening respiratory effort.  Continue Incruse.     Reflux, stable.  Continue Carafate   Malnutrition, moderate, likely in the setting of malignancy.  Dietary consulted, following nutritional supplementation recommendations.      DVT prophylaxis: Lovenox Code Status: DNR Family Communication: No family at bedside, updated wife on phone, 10/17 Disposition Plan: Monitor respiratory status with chest tube removed, continue IV Unasyn while inpatient, awaiting bed availability [facility]     [facility] Direct contact: see [email or web address] (further directions at bottom of note if needed) 7PM-7AM contact night coverage as at bottom of note 04/30/2019, 3:44 PM  LOS: 11 days   Consultants   IR, pulmonology, oncology  Procedures   IR guided chest tube placement ((right-sided), 10/14  Therapeutic/diagnostic thoracentesis 10/9  Antibiotics   Unasyn 10/8--  Cefepime 10/8  Interval History/Subjective  No acute events overnight Pain well controlled as long as he is still  Objective   Vitals:  Vitals:   04/30/19 0559 04/30/19 0732  BP: 99/64   Pulse: 83   Resp: 18   Temp: 98.2 F (36.8 C)   SpO2: 94% 100%    Exam:  Awake Alert, oriented, frail/cachectic African-American male, no distress Symmetrical Chest wall movement, diminished breath sounds throughout, normal respiratory effort on 4 L O2, no increased work of breathing Chest tube in place (prior to pulmonary removing chest tube this afternoon) Irregularly irregular rhythm, normal rate  Abd Soft, No tenderness,  No edema,   I have personally reviewed the following:   Data Reviewed: Basic Metabolic Panel: Recent Labs  Lab 04/25/19 0646 04/26/19 0531 04/27/19 0533 04/30/19 0514  NA 137 134* 136 136  K 3.1* 3.3* 4.1 4.1  CL 99 97* 99 99  CO2 30 29 28 29   GLUCOSE 104* 93 108* 91  BUN 8 8 10 6   CREATININE 0.41* 0.32* 0.47* 0.37*  CALCIUM 7.6* 7.4* 8.1* 7.9*  MG 1.5* 1.7  --  1.6*   Liver Function Tests: No results for input(s): AST, ALT, ALKPHOS, BILITOT, PROT, ALBUMIN in the last 168 hours. No results for input(s): LIPASE, AMYLASE in the last 168 hours. No results for input(s): AMMONIA in the last 168 hours. CBC: Recent Labs  Lab 04/25/19 0646 04/26/19 0531 04/27/19 0533 04/28/19 0533 04/29/19 0542 04/30/19 0514  WBC 12.9* 13.5* 15.8* 17.4* 20.1* 13.9*  NEUTROABS 10.6*  --   --   --   --   --   HGB 7.7* 8.1* 8.0* 7.3* 7.0* 8.0*  HCT 24.4* 25.9* 25.6* 23.0* 21.7* 25.8*   MCV 85.9 85.8 87.7 84.2 83.8 86.3  PLT 211 208 243 236 272 270   Cardiac Enzymes: No results for input(s): CKTOTAL, CKMB, CKMBINDEX, TROPONINI in the last 168 hours. BNP (last 3 results) Recent Labs    04/06/19 2217 04/19/19 1242  BNP 251.3* 136.6*    ProBNP (last 3 results) No results for input(s): PROBNP in the last 8760 hours.  CBG: No results for input(s): GLUCAP in the last 168 hours.  No results found for this or any previous visit (from the past 240 hour(s)).   Studies: Ct Chest Wo Contrast  Result Date: 04/29/2019 CLINICAL DATA:  Evaluate pleural effusion. History of lung cancer and previous right-sided chest tube placement. EXAM: CT CHEST WITHOUT CONTRAST TECHNIQUE: Multidetector CT imaging of the chest was performed following the standard protocol without IV contrast. COMPARISON:  Chest CT-04/19/2019; 04/06/2019 FINDINGS: Cardiovascular: Borderline cardiomegaly. Coronary artery calcifications. No pericardial effusion. No evidence of thoracic aortic aneurysm. Atherosclerotic plaque involving the origin of the branch vessels of the aortic arch. No intramural hematoma. Mediastinum/Nodes: Evaluation for mediastinal and hilar adenopathy is degraded secondary lack of intravenous contrast. Lungs/Pleura: Stable positioning of right-sided chest tube with significant reduction of previously noted partially loculated right-sided pleural effusion. A small amount of fluid remains within the right major fissure. A small pneumothorax is seen about the anterior basilar aspect of the right hemithorax (image 33,  series 7, potentially ex vacuo in etiology. Improved aeration of the right upper lobe however there are persistent air bronchograms about the right hilum and occlusion the right lower lobe bronchus with associated persistent near complete atelectasis/collapse of the right lower lobe. Unchanged approximately 0.8 cm right upper lobe nodule, similar to the 03/2019 examination and potentially  representative of a intraparenchymal metastasis. Interval development of a trace left-sided pleural effusion with worsening interstitial opacities within the left lung and consolidative opacities associated air bronchograms within the left lower lobe. Upper Abdomen: Limited noncontrast the superior aspect of the right-sided nephrolithiasis which evaluation of the upper abdomen demonstrates measures at least 0.4 cm in diameter (image 167, series 2). Musculoskeletal: No acute or aggressive osseous abnormalities. Regional soft tissues appear normal. Normal appearance of the thyroid gland. IMPRESSION: 1. Interval reduction in size loculated right-sided pleural effusion post chest tube placement with improved aeration of the right upper lobe and tiny right-sided pneumothorax, potentially ex vacuo in etiology. A small amount of fluid remains within the right major fissure. 2. Redemonstrated occlusion of the right lower lobe bronchus with associated near complete atelectasis/collapse of the right lower lobe likely attributable to provided history of lung cancer though a definitive parenchymal masses difficult to define secondary to consolidation and volume loss. 3. Interval development of a small left-sided effusion with worsening left basilar consolidative opacities, air bronchograms and interstitial thickening within the left lung, potentially representative asymmetric pulmonary edema though underlying infection is not excluded on the basis of this examination. Clinical correlation is advised. 4. Cardiomegaly. 5. Coronary calcifications.  Aortic Atherosclerosis (ICD10-I70.0). Electronically Signed   By: Sandi Mariscal M.D.   On: 04/29/2019 09:42    Scheduled Meds:  amoxicillin-clavulanate  1 tablet Oral BID   budesonide (PULMICORT) nebulizer solution  0.25 mg Nebulization BID   enoxaparin  60 mg Subcutaneous Q24H   feeding supplement  1 Container Oral BID BM   ipratropium-albuterol  3 mL Nebulization TID    magnesium oxide  400 mg Oral BID   mouth rinse  15 mL Mouth Rinse BID   metoprolol tartrate  25 mg Oral BID   multivitamin with minerals  1 tablet Oral Daily   polyethylene glycol  17 g Oral Daily   potassium chloride SA  20 mEq Oral Daily   sodium chloride flush  5 mL Intracatheter Q8H   sucralfate  1 g Oral TID WC & HS   Continuous Infusions:  sodium chloride 10 mL/hr at 04/29/19 1800    Principal Problem:   Aspiration pneumonia ([place]) Active Problems:   Acute respiratory failure with hypoxia (HCC)   Protein-calorie malnutrition, severe   Pulmonary embolism (HCC)   Loculated pleural effusion   PNA (pneumonia)   Malnutrition of moderate degree   Atrial fibrillation with RVR (HCC)   AF (paroxysmal atrial fibrillation) ([place])      Desiree Hane  [facility]

## 2019-04-30 NOTE — Progress Notes (Signed)
[facility]  Received request from [facility] for family interest in [facility]. Chart reviewed and eligibity has been confirmed. Spoke with spouse briefly by phone. She is aware [facility] is not able to offer a room today. Hospital liaison will follow up tomorrow re availability.   Thank you,  Erling Conte, [place] Hospital liaisons are listed daily on AMION under Hospice and [place].

## 2019-04-30 NOTE — Progress Notes (Signed)
Daily Progress Note   Patient Name: Tyler [name]       Date: 04/30/2019 DOB: 05/22/1961  Age: 58 y.o. MRN#: [id number] Attending Physician: Desiree Hane, MD Primary Care Physician: Brand Males, MD Admit Date: 04/19/2019  Reason for Consultation/Follow-up: Establishing goals of care  Subjective:  patient appears weaker, is resting in bed. His wife is at bedside. See below.   Length of Stay: 11  Current Medications: Scheduled Meds:  . amoxicillin-clavulanate  1 tablet Oral BID  . budesonide (PULMICORT) nebulizer solution  0.25 mg Nebulization BID  . enoxaparin  60 mg Subcutaneous Q24H  . feeding supplement  1 Container Oral BID BM  . ipratropium-albuterol  3 mL Nebulization TID  . magnesium oxide  400 mg Oral BID  . mouth rinse  15 mL Mouth Rinse BID  . metoprolol tartrate  25 mg Oral BID  . multivitamin with minerals  1 tablet Oral Daily  . polyethylene glycol  17 g Oral Daily  . potassium chloride SA  20 mEq Oral Daily  . sodium chloride flush  5 mL Intracatheter Q8H  . sucralfate  1 g Oral TID WC & HS    Continuous Infusions: . sodium chloride 10 mL/hr at 04/29/19 1800  . magnesium sulfate bolus IVPB 1 g (04/30/19 1046)    PRN Meds: acetaminophen, fentaNYL (SUBLIMAZE) injection, ipratropium-albuterol, metoprolol tartrate, oxyCODONE  Physical Exam         Weak appearing gentleman, somewhat withdrawn. Resting in bed Diminished breath sounds R chest drain S1 S2 Muscle wasting evident Awake alert, non focal Vital Signs: BP 99/64   Pulse 83   Temp 98.2 F (36.8 C) (Oral)   Resp 18   Ht 6' 2.02" (1.88 m)   Wt 42.9 kg   SpO2 100%   BMI 12.14 kg/m  SpO2: SpO2: 100 % O2 Device: O2 Device: Nasal Cannula O2 Flow Rate: O2 Flow Rate (L/min): 4  L/min(decreased to 3 lpm post neb)  Intake/output summary:   Intake/Output Summary (Last 24 hours) at 04/30/2019 1224 Last data filed at 04/30/2019 0318 Gross per 24 hour  Intake 320 ml  Output 405 ml  Net -85 ml   LBM: Last BM Date: 04/29/19 Baseline Weight: Weight: 59 kg Most recent weight: Weight: 42.9 kg       Palliative Assessment/Data:  Patient Active Problem List   Diagnosis Date Noted  . AF (paroxysmal atrial fibrillation) ([place]) 04/27/2019  . Atrial fibrillation with RVR ([place]) 04/25/2019  . Malnutrition of moderate degree 04/21/2019  . Aspiration pneumonia ([place]) 04/19/2019  . PNA (pneumonia) 04/19/2019  . Pleural effusion, right   . Acute pulmonary embolism ([place]) 04/07/2019  . Pulmonary embolism ([place]) 04/06/2019  . Anemia 04/06/2019  . Hypokalemia 04/06/2019  . Malignant neoplasm of bronchus of right lower lobe ([place]) 03/01/2019  . Adenocarcinoma of right lung, stage 3 ([place]) 01/17/2019  . Goals of care, counseling/discussion 01/17/2019  . Encounter for antineoplastic chemotherapy 01/17/2019  . Tobacco abuse 01/10/2019  . Mediastinal adenopathy 01/10/2019  . Right upper lobe pulmonary nodule 04/19/2018  . Multiple pulmonary nodules 04/02/2018  . Right lower lobe pulmonary nodule 04/02/2018  . Protein-calorie malnutrition, severe 08/09/2017  . Acute lung injury   . Pneumonitis   . BOOP (bronchiolitis obliterans with organizing pneumonia) ([place])   . Acute respiratory failure with hypoxia ([place]) 08/08/2017  . Hypoxia 08/07/2017  . Dehydration   . Diarrhea   . CAP (community acquired pneumonia) 08/04/2017  . Hyponatremia 08/04/2017  . Tachycardia 08/04/2017  . Influenza A     Palliative Care Assessment & Plan   Patient Profile:    Assessment: 58 y.o. male  admitted on 04/19/2019  59 yo gentleman with history of ETOH and tobacco use, was diagnosed with lung cancer in July 2020, s/p radiation, chemotherapy, also diagnosed with DVT PE last month,  admitted with post obstructive PNA, pleural effusion. Patient being followed closely by PCCM, also by IR, remains admitted to hospital medicine service.  In this hospitalization, he is s/p thoracentesis R side, CT guded chest tube placement and instillation of tpa/DNASE. Patient with ongoing drainage from chest tube, CT chest repeated.    Declining functional status Metastatic lung cancer Recent DVT PE Loculated effusion, PNA  Recommendations/Plan:  Family meeting with patient and wife, then additionally also with wife outside the room. Discussed about the patient's current condition, goals of care and disposition options.   Patient and wife elect for comfort measures and residential hospice.   PO antibiotic.    Code Status:    Code Status Orders  (From admission, onward)         Start     Ordered   04/28/19 1135  Do not attempt resuscitation (DNR)  Continuous    Question Answer Comment  In the event of cardiac or respiratory ARREST Do not call a "code blue"   In the event of cardiac or respiratory ARREST Do not perform Intubation, CPR, defibrillation or ACLS   In the event of cardiac or respiratory ARREST Use medication by any route, position, wound care, and other measures to relive pain and suffering. May use oxygen, suction and manual treatment of airway obstruction as needed for comfort.      04/28/19 1134        Code Status History    Date Active Date Inactive Code Status Order ID Comments User Context   04/19/2019 1832 04/28/2019 1134 Full Code [id number]  Nita Sells, MD ED   04/07/2019 0320 04/10/2019 1813 Full Code [id number]  Gwynne Edinger, MD ED   08/04/2017 0135 08/11/2017 1543 Full Code [id number]  Jani Gravel, MD Inpatient   Advance Care Planning Activity       Prognosis:   < 2 weeks  Discharge Planning:  Hospice facility  Care plan was discussed with patient, wife, PCCM and [facility] MD.  Thank you for allowing the Palliative Medicine Team to  assist in the care of this patient.   Time In: 10 Time Out: 10.35 Total Time 35 Prolonged Time Billed  no       Greater than 50%  of this time was spent counseling and coordinating care related to the above assessment and plan.  Loistine Chance, MD [phone] Please contact Palliative Medicine Team phone at [phone] for questions and concerns.

## 2019-05-01 DIAGNOSIS — C3491 Malignant neoplasm of unspecified part of right bronchus or lung: Secondary | ICD-10-CM

## 2019-05-01 LAB — CBC
HCT: 24.1 % — ABNORMAL LOW (ref 39.0–52.0)
Hemoglobin: 7.4 g/dL — ABNORMAL LOW (ref 13.0–17.0)
MCH: 26.4 pg (ref 26.0–34.0)
MCHC: 30.7 g/dL (ref 30.0–36.0)
MCV: 86.1 fL (ref 80.0–100.0)
Platelets: 293 10*3/uL (ref 150–400)
RBC: 2.8 MIL/uL — ABNORMAL LOW (ref 4.22–5.81)
RDW: 19 % — ABNORMAL HIGH (ref 11.5–15.5)
WBC: 11.3 10*3/uL — ABNORMAL HIGH (ref 4.0–10.5)
nRBC: 0 % (ref 0.0–0.2)

## 2019-05-01 LAB — BASIC METABOLIC PANEL
Anion gap: 7 (ref 5–15)
BUN: 6 mg/dL (ref 6–20)
CO2: 29 mmol/L (ref 22–32)
Calcium: 7.7 mg/dL — ABNORMAL LOW (ref 8.9–10.3)
Chloride: 98 mmol/L (ref 98–111)
Creatinine, Ser: 0.57 mg/dL — ABNORMAL LOW (ref 0.61–1.24)
GFR calc Af Amer: >60 mL/min (ref >60)
GFR calc non Af Amer: >60 mL/min (ref >60)
Glucose, Bld: 97 mg/dL (ref 70–99)
Potassium: 4 mmol/L (ref 3.5–5.1)
Sodium: 134 mmol/L — ABNORMAL LOW (ref 135–145)

## 2019-05-01 LAB — MAGNESIUM: Magnesium: 1.7 mg/dL (ref 1.7–2.4)

## 2019-05-01 MED ORDER — GUAIFENESIN-DM 100-10 MG/5ML PO SYRP
5.0000 mL | ORAL_SOLUTION | ORAL | Status: DC | PRN
  Administered 2019-05-01 – 2019-05-02 (×2): 5 mL via ORAL
  Filled 2019-05-01 (×2): qty 10

## 2019-05-01 MED ORDER — IPRATROPIUM-ALBUTEROL 0.5-2.5 (3) MG/3ML IN SOLN
3.0000 mL | Freq: Two times a day (BID) | RESPIRATORY_TRACT | Status: DC
  Administered 2019-05-01 – 2019-05-02 (×2): 3 mL via RESPIRATORY_TRACT
  Filled 2019-05-01 (×2): qty 3

## 2019-05-01 NOTE — Progress Notes (Signed)
[facility]  PROGRESS NOTE  Tyler [name] HCW:[id number] DOB: 16-Apr-1961 DOA: 04/19/2019 PCP: Brand Males, MD  Brief History    Tyler [name] is a 58 y.o. year old male with medical history significant for recently diagnosed stage III lung cancer with C7/2020, completed chemotherapy cycles), recent PE/bilateral DVT on Lovenox (diagnosed 9/25), who initially presented to ED on 04/19/2019 with worsening right-sided chest pain and due to no acute findings on  CXR sent home with perocet. He returned later that same day with  Confusion, fever and shortness of breath and admitted for sepsis secondary to pneumonia in setting of malignancy.  Hospital Course:  CT chest showing loculated right pleural effusion concern for parapneumonic effusion given neutrophilic exudate and low glucose in diagnostic studies (therapeutic/diagnostic thoracentesis on 10/9, 1.2 L removed, cultures negative).  Case was discussed with thoracic surgery (10/10) who felt patient recommended no indication for thoracotomy.  Case was discussed with IR for possible Pleurx catheter but given loculated findings recommended pulmonary consultation.  Patient underwent IR guided chest tube and has had TPA/DNASe administered by [facility] during hospital stay. Palliative care was consulted to assist in goals of care due to poor prognosis for overall lung comorbidities.   A & P     Atrial fibrillation, rate controlled.  Briefly required IV diltiazem push, remains rate controlled on oral metoprolol 25 mg twice daily   Loculated right pleural effusion, likely parapneumonic effusion and postobstructive pneumonia,.  Transitioned from Unasyn to Augmentin.  Chest tube removed on 10/19.  O2 requirement stable on 3 L currently.  Comfort care measures currently, less than 2-week prognosis PCCM/oncology team agree.  Rishi palliative assistance.  Currently awaiting residential hospice placement [facility]   Pulmonary embolism/bilateral  DVTs, malignancy related.  Lovenox in hospital, will anticipate discontinuing that with transfer to residential hospice   Acute on chronic hypoxic respiratory failure, stable.  Currently on 3-4 L has been stable for the past 24 hours.  Multifactorial etiology due to above.     Acute on chronic normocytic anemia.  Iron panel consistent with anemia of chronic disease likely from malignancy.     Hypokalemia/hypomagnesemia, resolved.  Corrected with repletion.    Stage III non-small lung cancer, diagnosed 01/2019.  Patient has evidence of disease progression despite chemoradiation treatment. Dr. [name] made aware of patient's status by admitting physician.  Oncology agree patient has incurable metastatic lung cancer.  Meets criteria for residential hospice given poor prognosis     DVT prophylaxis: Lovenox while inpatient Code Status: DNR Family Communication: No family at bedside, wife updated by palliative care team Disposition Plan:  awaiting bed availability [facility]     [facility] Direct contact: see [email or web address] (further directions at bottom of note if needed) 7PM-7AM contact night coverage as at bottom of note 05/01/2019, 5:48 PM  LOS: 12 days   Consultants  . IR, pulmonology, oncology  Procedures  . IR guided chest tube placement ((right-sided), 10/14 . Therapeutic/diagnostic thoracentesis 10/9  Antibiotics  . Unasyn 10/8--10/19 . Augmentin 10/20 . Cefepime 10/8  Interval History/Subjective  No acute events overnight Pain well controlled as long as he is still  Objective   Vitals:  Vitals:   05/01/19 0724 05/01/19 1359  BP:  105/76  Pulse: 94 87  Resp: 18 16  Temp:  97.6 F (36.4 C)  SpO2: 91% 100%    Exam:  Awake Alert, oriented, frail/cachectic African-American male, no distress Symmetrical Chest wall movement, diminished breath sounds throughout, normal respiratory  effort on 3 L O2, no increased work of breathing    I have  personally reviewed the following:   Data Reviewed: Basic Metabolic Panel: Recent Labs  Lab 04/25/19 0646 04/26/19 0531 04/27/19 0533 04/30/19 0514 05/01/19 0701  NA 137 134* 136 136 134*  K 3.1* 3.3* 4.1 4.1 4.0  CL 99 97* 99 99 98  CO2 30 29 28 29 29   GLUCOSE 104* 93 108* 91 97  BUN 8 8 10 6 6   CREATININE 0.41* 0.32* 0.47* 0.37* 0.57*  CALCIUM 7.6* 7.4* 8.1* 7.9* 7.7*  MG 1.5* 1.7  --  1.6* 1.7   Liver Function Tests: No results for input(s): AST, ALT, ALKPHOS, BILITOT, PROT, ALBUMIN in the last 168 hours. No results for input(s): LIPASE, AMYLASE in the last 168 hours. No results for input(s): AMMONIA in the last 168 hours. CBC: Recent Labs  Lab 04/25/19 0646  04/27/19 0533 04/28/19 0533 04/29/19 0542 04/30/19 0514 05/01/19 0701  WBC 12.9*   < > 15.8* 17.4* 20.1* 13.9* 11.3*  NEUTROABS 10.6*  --   --   --   --   --   --   HGB 7.7*   < > 8.0* 7.3* 7.0* 8.0* 7.4*  HCT 24.4*   < > 25.6* 23.0* 21.7* 25.8* 24.1*  MCV 85.9   < > 87.7 84.2 83.8 86.3 86.1  PLT 211   < > 243 236 272 270 293   < > = values in this interval not displayed.   Cardiac Enzymes: No results for input(s): CKTOTAL, CKMB, CKMBINDEX, TROPONINI in the last 168 hours. BNP (last 3 results) Recent Labs    04/06/19 2217 04/19/19 1242  BNP 251.3* 136.6*    ProBNP (last 3 results) No results for input(s): PROBNP in the last 8760 hours.  CBG: No results for input(s): GLUCAP in the last 168 hours.  No results found for this or any previous visit (from the past 240 hour(s)).   Studies: No results found.  Scheduled Meds: . amoxicillin-clavulanate  1 tablet Oral BID  . budesonide (PULMICORT) nebulizer solution  0.25 mg Nebulization BID  . enoxaparin  60 mg Subcutaneous Q24H  . feeding supplement  1 Container Oral BID BM  . ipratropium-albuterol  3 mL Nebulization BID  . magnesium oxide  400 mg Oral BID  . mouth rinse  15 mL Mouth Rinse BID  . metoprolol tartrate  25 mg Oral BID  .  multivitamin with minerals  1 tablet Oral Daily  . polyethylene glycol  17 g Oral Daily  . potassium chloride SA  20 mEq Oral Daily  . sucralfate  1 g Oral TID WC & HS   Continuous Infusions: . sodium chloride 10 mL/hr at 04/29/19 1800    Principal Problem:   Aspiration pneumonia ([place]) Active Problems:   Acute respiratory failure with hypoxia (HCC)   Protein-calorie malnutrition, severe   Pulmonary embolism (HCC)   Loculated pleural effusion   PNA (pneumonia)   Malnutrition of moderate degree   Atrial fibrillation with RVR (HCC)   AF (paroxysmal atrial fibrillation) ([place])   Malignant neoplasm of right lung ([place])      Desiree Hane  [facility]

## 2019-05-01 NOTE — Progress Notes (Signed)
PT Cancellation Note  Patient Details Name: MACYN REMMERT MRN: [id number] DOB: September 19, 1960   Cancelled Treatment:    Reason Eval/Treat Not Completed: PT will sign off at this time. Plan is for residential hospice per palliative.   Weston Anna, PT Acute Rehabilitation Services Pager: [phone] Office: [phone]

## 2019-05-01 NOTE — Progress Notes (Signed)
Daily Progress Note   Patient Name: Tyler [name]       Date: 05/01/2019 DOB: [birth year]  Age: 58 y.o. MRN#: [id number] Attending Physician: Desiree Hane, MD Primary Care Physician: Brand Males, MD Admit Date: 04/19/2019  Reason for Consultation/Follow-up: Establishing goals of care  Subjective: Patient sleeping but arouses easily.  His wife is at bedside.   We reviewed plan moving forward, and they are in agreement with plan for residential hospice.    Discussed his multiple problems and prognosis with wife per her requests to "better explain to some family because they had questions why he can't just get better."   See below.   Length of Stay: 12  Current Medications: Scheduled Meds:  . amoxicillin-clavulanate  1 tablet Oral BID  . budesonide (PULMICORT) nebulizer solution  0.25 mg Nebulization BID  . enoxaparin  60 mg Subcutaneous Q24H  . feeding supplement  1 Container Oral BID BM  . ipratropium-albuterol  3 mL Nebulization TID  . magnesium oxide  400 mg Oral BID  . mouth rinse  15 mL Mouth Rinse BID  . metoprolol tartrate  25 mg Oral BID  . multivitamin with minerals  1 tablet Oral Daily  . polyethylene glycol  17 g Oral Daily  . potassium chloride SA  20 mEq Oral Daily  . sucralfate  1 g Oral TID WC & HS    Continuous Infusions: . sodium chloride 10 mL/hr at 04/29/19 1800    PRN Meds: acetaminophen, fentaNYL (SUBLIMAZE) injection, ipratropium-albuterol, metoprolol tartrate, oxyCODONE  Physical Exam         [name]: Alert, awake, in no acute distress.  Frail and chronically ill appearing  Heart: Regular rate and rhythm. No murmur appreciated. Lungs: Decreased air movement Abdomen: Soft, nontender, nondistended Ext: No significant edema Skin: Warm and  dry Vital Signs: BP 98/68   Pulse 94   Temp 97.8 F (36.6 C) (Oral)   Resp 18   Ht 6' 2.02" (1.88 m)   Wt 40.8 kg   SpO2 91%   BMI 11.54 kg/m  SpO2: SpO2: 91 % O2 Device: O2 Device: Nasal Cannula O2 Flow Rate: O2 Flow Rate (L/min): 3 L/min  Intake/output summary:   Intake/Output Summary (Last 24 hours) at 05/01/2019 1050 Last data filed at 05/01/2019 1000 Gross per 24 hour  Intake  1930.67 ml  Output 750 ml  Net 1180.67 ml   LBM: Last BM Date: 04/30/19 Baseline Weight: Weight: 59 kg Most recent weight: Weight: 40.8 kg       Palliative Assessment/Data:      Patient Active Problem List   Diagnosis Date Noted  . Malignant neoplasm of right lung ([place])   . AF (paroxysmal atrial fibrillation) ([place]) 04/27/2019  . Atrial fibrillation with RVR ([place]) 04/25/2019  . Malnutrition of moderate degree 04/21/2019  . Aspiration pneumonia ([place]) 04/19/2019  . PNA (pneumonia) 04/19/2019  . Loculated pleural effusion   . Acute pulmonary embolism ([place]) 04/07/2019  . Pulmonary embolism ([place]) 04/06/2019  . Anemia 04/06/2019  . Hypokalemia 04/06/2019  . Malignant neoplasm of bronchus of right lower lobe ([place]) 03/01/2019  . Adenocarcinoma of right lung, stage 3 ([place]) 01/17/2019  . Goals of care, counseling/discussion 01/17/2019  . Encounter for antineoplastic chemotherapy 01/17/2019  . Tobacco abuse 01/10/2019  . Mediastinal adenopathy 01/10/2019  . Right upper lobe pulmonary nodule 04/19/2018  . Multiple pulmonary nodules 04/02/2018  . Right lower lobe pulmonary nodule 04/02/2018  . Protein-calorie malnutrition, severe 08/09/2017  . Acute lung injury   . Pneumonitis   . BOOP (bronchiolitis obliterans with organizing pneumonia) ([place])   . Acute respiratory failure with hypoxia ([place]) 08/08/2017  . Hypoxia 08/07/2017  . Dehydration   . Diarrhea   . CAP (community acquired pneumonia) 08/04/2017  . Hyponatremia 08/04/2017  . Tachycardia 08/04/2017  . Influenza A     Palliative  Care Assessment & Plan   Patient Profile:    Assessment: 58 y.o. male  admitted on 04/19/2019  58 yo gentleman with history of ETOH and tobacco use, was diagnosed with lung cancer in July 2020, s/p radiation, chemotherapy, also diagnosed with DVT PE last month, admitted with post obstructive PNA, pleural effusion. Patient being followed closely by PCCM, also by IR, remains admitted to hospital medicine service.  In this hospitalization, he is s/p thoracentesis R side, CT guded chest tube placement and instillation of tpa/DNASE. Patient with ongoing drainage from chest tube, CT chest repeated.    Declining functional status Metastatic lung cancer Recent DVT PE Loculated effusion, PNA  Recommendations/Plan:  Reviewed current condition, goals of care and disposition options per wife request as his family has been asking some questions to which she did not know the answer.  Goal remains comfort and transition to residential hospice.   PO antibiotic.    Code Status:    Code Status Orders  (From admission, onward)         Start     Ordered   04/28/19 1135  Do not attempt resuscitation (DNR)  Continuous    Question Answer Comment  In the event of cardiac or respiratory ARREST Do not call a "code blue"   In the event of cardiac or respiratory ARREST Do not perform Intubation, CPR, defibrillation or ACLS   In the event of cardiac or respiratory ARREST Use medication by any route, position, wound care, and other measures to relive pain and suffering. May use oxygen, suction and manual treatment of airway obstruction as needed for comfort.      04/28/19 1134        Code Status History    Date Active Date Inactive Code Status Order ID Comments User Context   04/19/2019 1832 04/28/2019 1134 Full Code [id number]  Nita Sells, MD ED   04/07/2019 0320 04/10/2019 1813 Full Code [id number]  Wouk, Ailene Rud, MD ED  08/04/2017 0135 08/11/2017 1543 Full Code [id number]  Jani Gravel, MD  Inpatient   Advance Care Planning Activity       Prognosis:   < 2 weeks  Discharge Planning:  Hospice facility  Care plan was discussed with patient, wife, RN.   Thank you for allowing the Palliative Medicine Team to assist in the care of this patient.   Time In: 1020 Time Out: 1055 Total Time 35 Prolonged Time Billed  no       Greater than 50%  of this time was spent counseling and coordinating care related to the above assessment and plan.  Micheline Rough, MD  Please contact Palliative Medicine Team phone at [phone] for questions and concerns.

## 2019-05-01 NOTE — Progress Notes (Signed)
HEMATOLOGY-ONCOLOGY PROGRESS NOTE  SUBJECTIVE: Chart has been reviewed. The patient has metastatic nsclca, adenocarcinoma. He presented to the ER with right sided chest pain, confusion, fever, and shortness of breath. CT scan showed right pleural effusion. He had a right chest tube placed and had TPA/DNAS administered by PCCM. Palliative care consulted for [place]. The patient has decided to pursue Hospice and plans to discharge to [facility] once a be becomes available.  When seen today, the patient has some mild confusion.  He reports that he continues to cough up sputum but does not notice any hemoptysis.  He also reports ongoing shortness of breath and some discomfort in his right upper chest.  Pain is overall well controlled with current pain medications.  He has no other complaints this morning.  Oncology History  Adenocarcinoma of right lung, stage 3 ([place])  01/17/2019 Initial Diagnosis   Adenocarcinoma of right lung, stage 3 ([place])   01/29/2019 - 03/18/2019 Chemotherapy   The patient had palonosetron (ALOXI) injection 0.25 mg, 0.25 mg, Intravenous,  Once, 7 of 7 cycles Administration: 0.25 mg (01/29/2019), 0.25 mg (02/26/2019), 0.25 mg (03/05/2019), 0.25 mg (02/05/2019), 0.25 mg (03/12/2019), 0.25 mg (02/12/2019), 0.25 mg (02/19/2019) CARBOplatin (PARAPLATIN) 220 mg in sodium chloride 0.9 % 250 mL chemo infusion, 220 mg (100 % of original dose 222.2 mg), Intravenous,  Once, 7 of 7 cycles Dose modification: 222.2 mg (original dose 222.2 mg, Cycle 1) Administration: 220 mg (01/29/2019), 220 mg (02/26/2019), 220 mg (03/05/2019), 220 mg (03/12/2019), 220 mg (02/12/2019), 220 mg (02/19/2019) PACLitaxel (TAXOL) 84 mg in sodium chloride 0.9 % 250 mL chemo infusion (</= 80mg /m2), 45 mg/m2 = 84 mg, Intravenous,  Once, 7 of 7 cycles Administration: 84 mg (01/29/2019), 84 mg (02/26/2019), 84 mg (03/05/2019), 84 mg (02/05/2019), 84 mg (03/12/2019), 84 mg (02/12/2019), 84 mg (02/19/2019)  for chemotherapy treatment.       REVIEW  OF SYSTEMS:   As noted in the HPI.  I have reviewed the past medical history, past surgical history, social history and family history with the patient and they are unchanged from previous note.   PHYSICAL EXAMINATION: ECOG PERFORMANCE STATUS: 4 - Bedbound  Vitals:   05/01/19 0543 05/01/19 0724  BP: 98/68   Pulse: 82 94  Resp: 20 18  Temp: 97.8 F (36.6 C)   SpO2: 96% 91%   Filed Weights   04/29/19 0606 04/30/19 0251 05/01/19 0500  Weight: 94 lb 12.8 oz (43 kg) 94 lb 9.2 oz (42.9 kg) 89 lb 15.2 oz (40.8 kg)    Intake/Output from previous day: 10/19 0701 - 10/20 0700 In: 1930.7 [P.O.:120; I.V.:117; IV Piggyback:1693.7] Out: 500 [Urine:500]  GENERAL: Chronically ill appearing male, cachectic.  No distress. LUNGS: Diminished breath sounds with some scattered rhonchi noted in the anterior lung fields. HEART: Irregularly irregular, no edema ABDOMEN:abdomen soft, non-tender and normal bowel sounds Musculoskeletal:no cyanosis of digits and no clubbing  NEURO: Alert, with some mild disorientation  LABORATORY DATA:  I have reviewed the data as listed CMP Latest Ref Rng & Units 05/01/2019 04/30/2019 04/27/2019  Glucose 70 - 99 mg/dL 97 91 108(H)  BUN 6 - 20 mg/dL 6 6 10   Creatinine 0.61 - 1.24 mg/dL 0.57(L) 0.37(L) 0.47(L)  Sodium 135 - 145 mmol/L 134(L) 136 136  Potassium 3.5 - 5.1 mmol/L 4.0 4.1 4.1  Chloride 98 - 111 mmol/L 98 99 99  CO2 22 - 32 mmol/L 29 29 28   Calcium 8.9 - 10.3 mg/dL 7.7(L) 7.9(L) 8.1(L)  Total Protein 6.5 -  8.1 g/dL - - -  Total Bilirubin 0.3 - 1.2 mg/dL - - -  Alkaline Phos 38 - 126 U/L - - -  AST 15 - 41 U/L - - -  ALT 0 - 44 U/L - - -    Lab Results  Component Value Date   WBC 11.3 (H) 05/01/2019   HGB 7.4 (L) 05/01/2019   HCT 24.1 (L) 05/01/2019   MCV 86.1 05/01/2019   PLT 293 05/01/2019   NEUTROABS 10.6 (H) 04/25/2019    Dg Chest 1 View  Result Date: 04/20/2019 CLINICAL DATA:  Post right thoracentesis EXAM: CHEST  1 VIEW COMPARISON:   04/20/2019 FINDINGS: Right pleural effusion markedly improved. Small residual right effusion. Improvement in right lower lobe consolidation. No pneumothorax Diffuse bilateral airspace disease unchanged. Possible pneumonia or edema. No left effusion. IMPRESSION: No pneumothorax post right thoracentesis. Partial re-expansion right lower lobe Diffuse bilateral airspace disease unchanged compatible with edema or pneumonia. Electronically Signed   By: Franchot Gallo M.D.   On: 04/20/2019 15:49   Dg Chest 2 View  Result Date: 04/24/2019 CLINICAL DATA:  58 year old male with history of lung cancer status post chemotherapy. EXAM: CHEST - 2 VIEW COMPARISON:  Chest x-ray 04/21/2019. FINDINGS: Large right pleural effusion with extensive atelectasis and/or consolidation in the base of the right lung. Small left pleural effusion. Patchy multifocal interstitial opacities widespread throughout the lungs bilaterally, with relative sparing of the apex of the left upper lobe. Pulmonary vasculature does not appearing origin. Heart size is normal. Upper mediastinal contours are within normal limits. IMPRESSION: 1. Patchy multifocal interstitial and airspace disease in the lungs bilaterally, asymmetrically distributed, favored to reflect a multilobar pneumonia. 2. Large right pleural effusion with areas of atelectasis and/or consolidation in the right lung base. Electronically Signed   By: Vinnie Langton M.D.   On: 04/24/2019 17:02   Dg Chest 2 View  Result Date: 04/21/2019 CLINICAL DATA:  Shortness of breath. EXAM: CHEST - 2 VIEW COMPARISON:  April 20, 2019. FINDINGS: Stable cardiomediastinal silhouette. No pneumothorax is noted. Moderate right pleural effusion is noted which may be loculated. Right basilar atelectasis or infiltrate is noted. Stable left perihilar and basilar reticular opacity is noted which may represent edema or possibly scarring. Bony thorax unremarkable. IMPRESSION: Increased right pleural effusion is  noted with associated right basilar atelectasis or infiltrate. Stable left midlung and basilar opacity as described above. Electronically Signed   By: Marijo Conception M.D.   On: 04/21/2019 11:32   Dg Chest 2 View  Result Date: 04/20/2019 CLINICAL DATA:  Shortness of breath with productive cough. History of lung cancer. EXAM: CHEST - 2 VIEW COMPARISON:  Radiographs and CT 04/19/2019. FINDINGS: Lateral view was repeated. Right pleural effusion has significantly reaccumulated compared with the prior studies. This is partially loculated inferolaterally. There are worsening airspace opacities at both lung bases which could reflect edema or inflammation. No pneumothorax or significant left pleural effusion. The visualized heart size and mediastinal contours are stable. No acute osseous findings. IMPRESSION: 1. Enlarging right pleural effusion with worsening airspace opacities at both lung bases which could reflect edema or inflammation. 2. No other significant changes identified compared with prior studies. Electronically Signed   By: Richardean Sale M.D.   On: 04/20/2019 11:24   Dg Chest 2 View  Result Date: 04/06/2019 CLINICAL DATA:  Chest pain. EXAM: CHEST - 2 VIEW COMPARISON:  Radiograph of January 11, 2019. FINDINGS: The heart size and mediastinal contours are within normal limits. No  pneumothorax is noted. Left lung is clear. Probable lower lobe atelectasis or pneumonia is noted with small right pleural effusion. The visualized skeletal structures are unremarkable. IMPRESSION: Right lower lobe opacity is noted most consistent with atelectasis or infiltrate with associated pleural effusion. Followup PA and lateral chest X-ray is recommended in 3-4 weeks following trial of antibiotic therapy to ensure resolution and exclude underlying malignancy. Electronically Signed   By: Marijo Conception M.D.   On: 04/06/2019 20:19   Ct Head Wo Contrast  Result Date: 04/19/2019 CLINICAL DATA:  Altered mental status EXAM: CT  HEAD WITHOUT CONTRAST TECHNIQUE: Contiguous axial images were obtained from the base of the skull through the vertex without intravenous contrast. COMPARISON:  MR brain, 01/18/2019 FINDINGS: Brain: No evidence of acute infarction, hemorrhage, hydrocephalus, extra-axial collection or mass lesion/mass effect. The ventricular white matter hypodensity. Vascular: No hyperdense vessel or unexpected calcification. Skull: Normal. Negative for fracture or focal lesion. Sinuses/Orbits: No acute finding. Other: None. IMPRESSION: No acute intracranial pathology.  Small-vessel white matter disease. Electronically Signed   By: Eddie Candle M.D.   On: 04/19/2019 15:02   Ct Chest Wo Contrast  Result Date: 04/29/2019 CLINICAL DATA:  Evaluate pleural effusion. History of lung cancer and previous right-sided chest tube placement. EXAM: CT CHEST WITHOUT CONTRAST TECHNIQUE: Multidetector CT imaging of the chest was performed following the standard protocol without IV contrast. COMPARISON:  Chest CT-04/19/2019; 04/06/2019 FINDINGS: Cardiovascular: Borderline cardiomegaly. Coronary artery calcifications. No pericardial effusion. No evidence of thoracic aortic aneurysm. Atherosclerotic plaque involving the origin of the branch vessels of the aortic arch. No intramural hematoma. Mediastinum/Nodes: Evaluation for mediastinal and hilar adenopathy is degraded secondary lack of intravenous contrast. Lungs/Pleura: Stable positioning of right-sided chest tube with significant reduction of previously noted partially loculated right-sided pleural effusion. A small amount of fluid remains within the right major fissure. A small pneumothorax is seen about the anterior basilar aspect of the right hemithorax (image 33, series 7, potentially ex vacuo in etiology. Improved aeration of the right upper lobe however there are persistent air bronchograms about the right hilum and occlusion the right lower lobe bronchus with associated persistent near  complete atelectasis/collapse of the right lower lobe. Unchanged approximately 0.8 cm right upper lobe nodule, similar to the 03/2019 examination and potentially representative of a intraparenchymal metastasis. Interval development of a trace left-sided pleural effusion with worsening interstitial opacities within the left lung and consolidative opacities associated air bronchograms within the left lower lobe. Upper Abdomen: Limited noncontrast the superior aspect of the right-sided nephrolithiasis which evaluation of the upper abdomen demonstrates measures at least 0.4 cm in diameter (image 167, series 2). Musculoskeletal: No acute or aggressive osseous abnormalities. Regional soft tissues appear normal. Normal appearance of the thyroid gland. IMPRESSION: 1. Interval reduction in size loculated right-sided pleural effusion post chest tube placement with improved aeration of the right upper lobe and tiny right-sided pneumothorax, potentially ex vacuo in etiology. A small amount of fluid remains within the right major fissure. 2. Redemonstrated occlusion of the right lower lobe bronchus with associated near complete atelectasis/collapse of the right lower lobe likely attributable to provided history of lung cancer though a definitive parenchymal masses difficult to define secondary to consolidation and volume loss. 3. Interval development of a small left-sided effusion with worsening left basilar consolidative opacities, air bronchograms and interstitial thickening within the left lung, potentially representative asymmetric pulmonary edema though underlying infection is not excluded on the basis of this examination. Clinical correlation is advised. 4. Cardiomegaly.  5. Coronary calcifications.  Aortic Atherosclerosis (ICD10-I70.0). Electronically Signed   By: Sandi Mariscal M.D.   On: 04/29/2019 09:42   Ct Chest W Contrast  Addendum Date: 04/06/2019   ADDENDUM REPORT: 04/06/2019 16:44 ADDENDUM: Critical  Value/emergent results were called by telephone at the time of interpretation on 04/06/2019 at 4:43 pm to the [facility] triage nurse Elmer Picker, who verbally acknowledged these results. Electronically Signed   By: Kerby Moors M.D.   On: 04/06/2019 16:44   Result Date: 04/06/2019 CLINICAL DATA:  Restaging right lung cancer. Right supraclavicular pain and cough. EXAM: CT CHEST WITH CONTRAST TECHNIQUE: Multidetector CT imaging of the chest was performed during intravenous contrast administration. CONTRAST:  45mL OMNIPAQUE IOHEXOL 300 MG/ML  SOLN COMPARISON:  CT chest 01/08/2019 and PET-CT 12/27/2018 FINDINGS: Cardiovascular: Normal heart size. No pericardial effusion. Mild aortic atherosclerosis. RCA and left circumflex coronary artery calcifications. There is a left upper lobe segmental branch pulmonary artery filling defect, image 76/2. This is compatible with acute pulmonary embolus. Additional PE identified within a segmental branch of the left lower lobe pulmonary artery, image 101/2 Mediastinum/Nodes: Normal appearance of the thyroid gland. The trachea appears patent and is midline. Normal appearance of the esophagus. No mediastinal or left hilar adenopathy. 1 cm right hilar lymph node is identified. Increased from 0.7 cm previously. Lungs/Pleura: Centrilobular and paraseptal emphysema. New moderate right pleural effusion and small left pleural effusion. Significant interval increase in tumor burden within the right lower lobe which measures 8.6 x 6.7 by 6.4 cm, image 112/2 and image 52/7. On the sagittal images this has a AP diameter 9.7 cm spanning the right lower lobe and right middle lobe. Adjacent ground-glass attenuation and interlobular septal thickening noted concerning for lymphangitic spread. On the previous exam this lesion was limited to the central right lower lobe measuring 1.9 x 2.5 by 1.5 cm. Tumor extension into the right infrahilar region with encasement of the  right lower lobe and right middle lobe bronchi identified with postobstructive pneumonitis. New tumor within the central right middle lobe measures 4.4 x 2.5 by 5.4 cm, image 117/2 and image 25/6. Solid nodule within the right upper lobe measures 8 mm, image 31/5. Unchanged from previous exam. Thin walled cavitary nodule within the posterior left upper lobe measures 4 mm, image 43/5. Previously this measured 6 mm. Unchanged, ill-defined part solid nodule in the lateral aspect of the superior segment of left lower lobe measuring 1 cm, image 78/5. Triangular-shaped, subpleural nodule in the posterior left lower lobe is new measuring 1.4 cm and may represent an area of pulmonary infarct is this is in the distribution of the left lower lobe pulmonary embolus. Upper Abdomen: No acute abnormality. Musculoskeletal: No acute or aggressive bone lesions identified. IMPRESSION: 1. Considerable increase in tumor burden involving the left lower lobe and now the right middle lobe. There is overlying ground-glass attenuation and interlobular septal thickening concerning for lymphangitic spread of disease. 2. New bilateral pleural effusions right greater than left. 3. Additional small solid and sub solid nodules involving the right upper lobe and left lung are not significantly changed in the interval. 4. Left upper lobe and left lower lobe segmental branch acute pulmonary artery emboli. Electronically Signed: By: Kerby Moors M.D. On: 04/06/2019 15:47   Ct Angio Chest Pe W Or Wo Contrast  Result Date: 04/19/2019 CLINICAL DATA:  Chest pain and respiratory distress. History of lung cancer. Discharge home was a long emergency room today. Diagnosed with pulmonary embolism last  week. EXAM: CT ANGIOGRAPHY CHEST WITH CONTRAST TECHNIQUE: Multidetector CT imaging of the chest was performed using the standard protocol during bolus administration of intravenous contrast. Multiplanar CT image reconstructions and MIPs were obtained to  evaluate the vascular anatomy. CONTRAST:  126mL OMNIPAQUE IOHEXOL 350 MG/ML SOLN COMPARISON:  04/06/2019 and 12/11/2018 FINDINGS: Cardiovascular: Mild stable cardiomegaly. Thoracic aorta is normal in caliber. Pulmonary arterial system is well opacified as the previously seen small proximal left-sided pulmonary emboli are no longer visualized. No current pulmonary emboli are visualized bilaterally. Remaining vascular structures are unremarkable. Mediastinum/Nodes: No definite adenopathy visualized. Remaining mediastinal structures are unremarkable. Lungs/Pleura: Lungs are adequately inflated demonstrate worsening of a moderate size right pleural effusion. Moderate consolidation over the right lower lobe slightly worse likely combination of atelectasis as well as patient's known right lung cancer. This is difficult to measure due to the adjacent consolidation and effusion. Stable mass over the anteromedial right midlung. Several small stable nodule opacities over the right upper lobe. Patchy mixed interstitial airspace density over the anterior right midlung. Worsening patchy airspace density over the superior segment of the left lower lobe which may be due to infection. Remainder of the left lung is unchanged. Biapical pleural thickening. Obstruction of several proximal right middle lobe and lower lobe bronchi which is a new finding and most likely due to aspiration or mucous plugging. Narrowing of the posterior right pulmonary vein in the region of patient's medial right infrahilar lung cancer. Mild emphysematous disease. Suggestion of aspirate material within the right-sided the distal trachea. Upper Abdomen: No acute findings. Musculoskeletal: No change. Review of the MIP images confirms the above findings. IMPRESSION: 1. Stable changes of patient's known right lower lobe lung cancer which is difficult to define due to the adjacent consolidation and effusion. Stable masslike consolidation over the medial aspect of  the anterior right midlung abutting the pericardium. Stable right upper lobe nodules suggesting metastatic disease. Worsening moderate size right pleural effusion with slight worsening consolidation in the right base. New obstruction of right middle lobe and lower lobe bronchi likely due to aspiration or mucous plugging. Narrowing of right posterior pulmonary vein. 2. Mild worsening patchy airspace process over the superior segment left lower lobe as well as anterior right midlung which may be due to infection. 3. Previously seen small left-sided pulmonary emboli no longer visualized. No current pulmonary emboli identified. 4.  Mild emphysematous disease.  Mild cardiomegaly. Electronically Signed   By: Marin Olp M.D.   On: 04/19/2019 15:25   Dg Chest Port 1 View  Result Date: 04/28/2019 CLINICAL DATA:  Right-sided pleural effusion with right-sided chest tube. History of lung cancer. EXAM: PORTABLE CHEST 1 VIEW COMPARISON:  04/27/2019; 04/26/2019; chest CT-04/19/2019 FINDINGS: Grossly unchanged cardiac silhouette and mediastinal contours. Stable positioning of support apparatus with no change to slight increase in size of small potentially partially loculated right-sided pleural effusion. Slight worsening of bibasilar heterogeneous opacities. Ill-defined heterogeneous opacities are again seen with the peripheral aspect of the left mid lung. No pneumothorax. No definite evidence of edema. No acute osseous abnormalities. IMPRESSION: 1.  Stable positioning of support apparatus.  No pneumothorax. 2. Suspected slight increase in size of small right-sided pleural effusion and worsening bibasilar opacities, atelectasis versus infiltrate. Electronically Signed   By: Sandi Mariscal M.D.   On: 04/28/2019 05:59   Dg Chest Port 1 View  Result Date: 04/27/2019 CLINICAL DATA:  Acute respiratory failure. EXAM: PORTABLE CHEST 1 VIEW COMPARISON:  04/26/2019 FINDINGS: Stable position of the right basilar chest  tube.  Improved aeration at the right lung base suggesting decreased right pleural effusion. Persistent linear and patchy densities at the right lung base. No evidence for pneumothorax. Hazy interstitial densities in the left lung are unchanged. Heart size is within normal limits and stable. The trachea is midline. IMPRESSION: 1. Improved aeration of the right lung base compatible with decreased right pleural fluid. Right chest tube is in a stable position. 2. Negative for pneumothorax. 3. Linear and nodular right basilar densities are nonspecific. Electronically Signed   By: Markus Daft M.D.   On: 04/27/2019 09:29   Dg Chest Port 1 View  Result Date: 04/26/2019 CLINICAL DATA:  Pleural effusion EXAM: PORTABLE CHEST 1 VIEW COMPARISON:  04/24/2019 FINDINGS: Right basilar pleural chest tube in place with decreasing right effusion. Moderate right effusion remains. SPECT small right apical pneumothorax. Right base atelectasis or infiltrate. Diffuse interstitial prominence throughout the lungs. Heart is normal size. IMPRESSION: Decreasing right effusion following chest tube placement. Suspect small right apical pneumothorax with continued moderate right pleural effusion. Right basilar atelectasis or infiltrate. Stable diffuse interstitial prominence throughout the lungs. Electronically Signed   By: Rolm Baptise M.D.   On: 04/26/2019 09:51   Dg Chest Port 1 View  Result Date: 04/19/2019 CLINICAL DATA:  Right chest wall pain.  History of lung cancer EXAM: PORTABLE CHEST 1 VIEW COMPARISON:  04/07/2019 FINDINGS: Right base airspace disease and small pleural effusion with increased pleural fluid from comparison chest x-ray. No pneumothorax. Normal heart size. IMPRESSION: 1. Small right pleural effusion with reaccumulation since 04/07/2019. 2. Right base consolidation/mass, reference 04/06/2019 CT. Electronically Signed   By: Monte Fantasia M.D.   On: 04/19/2019 04:07   Dg Chest Port 1 View  Result Date:  04/07/2019 CLINICAL DATA:  Status post right thoracentesis. EXAM: PORTABLE CHEST 1 VIEW COMPARISON:  Film earlier today at 0800 hours FINDINGS: In the frontal projection there appears to be complete evacuation of right pleural fluid. Underlying atelectasis/consolidation at the right lung base remains. No pneumothorax or pulmonary edema. Stable cardiac enlargement. IMPRESSION: No pneumothorax after right thoracentesis with complete evacuation of right pleural fluid. Underlying atelectasis and consolidation remains at the right lung base. Electronically Signed   By: Aletta Edouard M.D.   On: 04/07/2019 13:59   Ct Perc Pleural Drain W/indwell Cath W/img Guide  Result Date: 04/25/2019 CLINICAL DATA:  Lung carcinoma with loculated right pleural effusion, shortness of breath. EXAM: CT GUIDED CHEST DRAIN PLACEMENT ANESTHESIA/SEDATION: Intravenous Fentanyl 20mcg and Versed 1mg  were administered as conscious sedation during continuous monitoring of the patient's level of consciousness and physiological / cardiorespiratory status by the radiology RN, with a total moderate sedation time of 26 minutes. PROCEDURE: The procedure, risks, benefits, and alternatives were explained to the patient. Questions regarding the procedure were encouraged and answered. The patient understands and consents to the procedure. Patient placed in left lateral decubitus position. Select axial scans through the thorax performed in the loculated right pleural effusion was localized. An appropriate skin entry site was determined and marked. The operative field was prepped with chlorhexidinein a sterile fashion, and a sterile drape was applied covering the operative field. A sterile gown and sterile gloves were used for the procedure. Local anesthesia was provided with 1% Lidocaine. Percutaneous entry needle advanced into the right pleural space. Fluid easily aspirated. Amplatz wire advanced easily, its position confirmed on CT fluoroscopy. Tract  dilated to facilitate placement of a 14 French pigtail drain catheter, placed within the posterior dependent aspect of the  collection. Catheter position confirmed on CT. Catheter was secured externally with 0 Prolene suture and StatLock and placed [place]. The patient tolerated the procedure well. COMPLICATIONS: None immediate FINDINGS: Loculated right pleural effusion was localized. 14 French pigtail drain catheter placed as above. IMPRESSION: 1. Technically successful CT-guided placement of right chest drain catheter. Electronically Signed   By: Lucrezia Europe M.D.   On: 04/25/2019 15:23   Vas [place] Lower Extremity Venous (dvt)  Result Date: 04/08/2019  Lower Venous Study Indications: Pulmonary embolism.  Risk Factors: Confirmed PE Cancer [place] - Lung. Comparison Study: No previous study available Performing Technologist: Toma Copier RVS  Examination Guidelines: A complete evaluation includes B-mode imaging, spectral Doppler, color Doppler, and power Doppler as needed of all accessible portions of each vessel. Bilateral testing is considered an integral part of a complete examination. Limited examinations for reoccurring indications may be performed as noted.  +---------+---------------+---------+-----------+----------+-------------------+ RIGHT    CompressibilityPhasicitySpontaneityPropertiesThrombus Aging      +---------+---------------+---------+-----------+----------+-------------------+ CFV      Full           Yes      Yes                                      +---------+---------------+---------+-----------+----------+-------------------+ SFJ      Full                                                             +---------+---------------+---------+-----------+----------+-------------------+ FV Prox  Full           Yes      Yes                                      +---------+---------------+---------+-----------+----------+-------------------+ FV Mid   Full                                                              +---------+---------------+---------+-----------+----------+-------------------+ FV DistalFull           Yes      Yes                                      +---------+---------------+---------+-----------+----------+-------------------+ PFV      Full           Yes      Yes                                      +---------+---------------+---------+-----------+----------+-------------------+ POP      Partial        Yes      Yes                  Acute -Phasic in  the proximal                                                              region.             +---------+---------------+---------+-----------+----------+-------------------+ PTV      Partial                                      Acute mid to                                                              proximal            +---------+---------------+---------+-----------+----------+-------------------+ PERO                                                  Not visualized      +---------+---------------+---------+-----------+----------+-------------------+   Right Technical Findings: Not visualized segments include Peroneal. Unable to visualize the peroneal vein. Mild technical difficulty throughout due to acoustic shadowing from calcification of the arteries  +---------+---------------+---------+-----------+----------+------------------+ LEFT     CompressibilityPhasicitySpontaneityPropertiesThrombus Aging     +---------+---------------+---------+-----------+----------+------------------+ CFV      Full           Yes      Yes                                     +---------+---------------+---------+-----------+----------+------------------+ SFJ      Full                                                             +---------+---------------+---------+-----------+----------+------------------+ FV Prox  Full           Yes      Yes                                     +---------+---------------+---------+-----------+----------+------------------+ FV Mid   Full           Yes      Yes                                     +---------+---------------+---------+-----------+----------+------------------+ FV DistalPartial                                      Acute diatal to  mid                +---------+---------------+---------+-----------+----------+------------------+ PFV      Full           Yes      Yes                                     +---------+---------------+---------+-----------+----------+------------------+ POP      Partial                                      Acute              +---------+---------------+---------+-----------+----------+------------------+ PTV      Partial                                      Acute - Mid to                                                           proximal           +---------+---------------+---------+-----------+----------+------------------+ PERO     Full                                                            +---------+---------------+---------+-----------+----------+------------------+   Left Technical Findings: Difficult to visualize the peroneal veins. Mld technical difficulty due to acoustic shadowing calification of the arteries.   Summary: Right: Findings consistent with acute deep vein thrombosis involving the right popliteal vein, and right posterior tibial veins. See technical findings listed above Left: Findings consistent with acute deep vein thrombosis involving the left posterior tibial veins, left popliteal vein, and left femoral vein. See technical findings listed above.  *See table(s) above for measurements and observations. Electronically signed by  Curt Jews MD on 04/08/2019 at 9:47:50 AM.    Final    US Thoracentesis Asp Pleural Space W/img Guide  Result Date: 04/20/2019 INDICATION: Patient with history of lung cancer, dyspnea, recurrent right pleural effusion. Request made for diagnostic and therapeutic right thoracentesis. EXAM: ULTRASOUND GUIDED DIAGNOSTIC AND THERAPEUTIC RIGHT THORACENTESIS MEDICATIONS: None COMPLICATIONS: None immediate. PROCEDURE: An ultrasound guided thoracentesis was thoroughly discussed with the patient and questions answered. The benefits, risks, alternatives and complications were also discussed. The patient understands and wishes to proceed with the procedure. Written consent was obtained. Ultrasound was performed to localize and mark an adequate pocket of fluid in the right chest. The area was then prepped and draped in the normal sterile fashion. 1% Lidocaine was used for local anesthesia. Under ultrasound guidance a 6 Fr Safe-T-Centesis catheter was introduced. Thoracentesis was performed. The catheter was removed and a dressing applied. FINDINGS: A total of approximately 1.2 liters of hazy, yellow fluid was removed. Samples were sent to the laboratory as requested by the clinical team. The pleural fluid collection was multiloculated. IMPRESSION: Successful ultrasound guided diagnostic and therapeutic right thoracentesis yielding  1.2 liters of pleural fluid. Read by: Rowe Robert, PA-C Electronically Signed   By: Jacqulynn Cadet M.D.   On: 04/20/2019 15:26   US Thoracentesis Asp Pleural Space W/img Guide  Result Date: 04/07/2019 INDICATION: Patient with history of adenocarcinoma of right lung, pulmonary embolism of left lung, dyspnea, and right pleural effusion. Request is made for diagnostic and therapeutic right thoracentesis. EXAM: ULTRASOUND GUIDED DIAGNOSTIC AND THERAPEUTIC RIGHT THORACENTESIS MEDICATIONS: 10 mL of 1% lidocaine COMPLICATIONS: None immediate. PROCEDURE: An ultrasound guided thoracentesis was  thoroughly discussed with the patient and questions answered. The benefits, risks, alternatives and complications were also discussed. The patient understands and wishes to proceed with the procedure. Written consent was obtained. Ultrasound was performed to localize and mark an adequate pocket of fluid in the right chest. The area was then prepped and draped in the normal sterile fashion. 1% Lidocaine was used for local anesthesia. Under ultrasound guidance a 6 Fr Safe-T-Centesis catheter was introduced. Thoracentesis was performed. The catheter was removed and a dressing applied. FINDINGS: A total of approximately 450 mL of clear yellow fluid was removed. Samples were sent to the laboratory as requested by the clinical team. IMPRESSION: Successful ultrasound guided right thoracentesis yielding 450 mL of pleural fluid. Read by: Earley Abide, PA-C Electronically Signed   By: Aletta Edouard M.D.   On: 04/07/2019 14:04    ASSESSMENT AND PLAN: 1.  Metastatic non-small cell lung cancer, adenocarcinoma 2.  Loculated right pleural effusion, concern for parapneumonic effusion 3.  Postobstructive pneumonia 4.  Pulmonary embolism/bilateral DVTs 5.  Acute on chronic hypoxic respiratory failure, improved 6.  Atrial fibrillation 7.  COPD 8.  Malnutrition  -The patient has elected to pursue comfort measures and residential hospice.  Given that he has incurable metastatic lung cancer, I agree with this decision.  He is not a candidate for any aggressive treatment given his continued decline in functional status. -Patient's respiratory status appears stable.  Recommend that he continue on oxygen when he transfers to [facility].  Titrate oxygen to comfort. -The patient remains on Lovenox for his pulmonary embolism and bilateral DVTs.  Given his limited life expectancy, we may need to consider stopping this.  We appreciate care provided by hospitalist, PCCM, and Palliative Care teams. Please let us know if you  need anything further from [place].    LOS: 12 days   Mikey Bussing, DNP, AGPCNP-BC, AOCNP 05/01/19

## 2019-05-01 NOTE — Progress Notes (Signed)
Nutrition Brief Note  Chart reviewed. Pt now transitioning to comfort care.  No further nutrition interventions warranted at this time.    Clayton Bibles, MS, RD, LDN Inpatient Clinical Dietitian Pager: [phone] After Hours Pager: [phone]

## 2019-05-02 LAB — CBC
HCT: 24.4 % — ABNORMAL LOW (ref 39.0–52.0)
Hemoglobin: 7.5 g/dL — ABNORMAL LOW (ref 13.0–17.0)
MCH: 26.5 pg (ref 26.0–34.0)
MCHC: 30.7 g/dL (ref 30.0–36.0)
MCV: 86.2 fL (ref 80.0–100.0)
Platelets: 324 10*3/uL (ref 150–400)
RBC: 2.83 MIL/uL — ABNORMAL LOW (ref 4.22–5.81)
RDW: 19.1 % — ABNORMAL HIGH (ref 11.5–15.5)
WBC: 10.4 10*3/uL (ref 4.0–10.5)
nRBC: 0 % (ref 0.0–0.2)

## 2019-05-02 MED ORDER — POLYETHYLENE GLYCOL 3350 17 G PO PACK: 17.0000 g | PACK | Freq: Every day | ORAL | Status: DC

## 2019-05-02 MED ORDER — AMOXICILLIN-POT CLAVULANATE 875-125 MG PO TABS: 1.0000 | ORAL_TABLET | Freq: Two times a day (BID) | ORAL | Status: DC

## 2019-05-02 MED ORDER — ENOXAPARIN SODIUM 100 MG/ML ~~LOC~~ SOLN: 60.0000 mg | SUBCUTANEOUS | Status: DC

## 2019-05-02 MED ORDER — METOPROLOL TARTRATE 25 MG PO TABS: 25.0000 mg | ORAL_TABLET | Freq: Two times a day (BID) | ORAL | Status: DC

## 2019-05-02 MED ORDER — OXYCODONE HCL 10 MG PO TABS: 10.0000 mg | ORAL_TABLET | ORAL | Status: DC | PRN

## 2019-05-02 NOTE — Progress Notes (Signed)
Pt discharged to [facility]. Discharge process explained to pt and wife. Pt and wife had no questions at this time.  SW is coordinating transfer. Pt to transport via [facility].   Report called to RN Sunday Spillers at [facility] at [phone]. [phone]

## 2019-05-02 NOTE — Discharge Instructions (Signed)
Hospice °Hospice is a service that is designed to provide people who are terminally ill and their families with medical, spiritual, and psychological support. Its aim is to improve your quality of life by keeping you as comfortable as possible in the final stages of life. °Who will be my providers when I begin hospice care? °Hospice teams often include: °· A nurse. °· A doctor. The hospice doctor will be available for your care, but you can include your regular doctor or nurse practitioner. °· A social worker. °· A counselor. °· A religious leader (such as a chaplain). °· A dietitian. °· Therapists. °· Trained volunteers who can help with care. °What services does hospice provide? °Hospice services can vary depending on the center or organization. Generally, they include: °· Ways to keep you comfortable, such as: °? Providing care in your home or in a home-like setting. °? Working with your family and friends to help meet your needs. °? Allowing you to enjoy the support of loved ones by receiving much of your basic care from family and friends. °· Pain relief and symptom management. The staff will supply all necessary medicines and equipment so that you can stay comfortable and alert enough to enjoy the company of your friends and family. °· Visits or care from a nurse and doctor. This may include 24-hour on-call services. °· Companionship when you are alone. °· Allowing you and your family to rest. Hospice staff may do light housekeeping, prepare meals, and run errands. °· Counseling. They will make sure your emotional, spiritual, and social needs are being met, as well as those needs of your family members. °· Spiritual care. This will be individualized to meet your needs and your family's needs. It may involve: °? Helping you and your family understand the dying process. °? Helping you say goodbye to your family and friends. °? Performing a specific religious ceremony or ritual. °· Massage. °· Nutrition  therapy. °· Physical and occupational therapy. °· Short-term inpatient care, if something cannot be managed in the home. °· Art or music therapy. °· Bereavement support for grieving family members. °When should hospice care begin? °Most people who use hospice are believed to have less than 6 months to live. °· Your family and health care providers can help you decide when hospice services should begin. °· If you live longer than 6 months but your condition does not improve, your doctor may be able to approve you for continued hospice care. °· If your condition improves, you may discontinue the program. °What should I consider before selecting a program? °Most hospice programs are run by nonprofit, independent organizations. Some are affiliated with hospitals, nursing homes, or home health care agencies. Hospice programs can take place in your home or at a hospice center, hospital, or skilled nursing facility. When choosing a hospice program, ask the following questions: °· What services are available to me? °· What services will be offered to my loved ones? °· How involved will my loved ones be? °· How involved will my health care provider be? °· Who makes up the hospice care team? How are they trained or screened? °· How will my pain and symptoms be managed? °· If my circumstances change, can the services be provided in a different setting, such as my home or in the hospital? °· Is the program reviewed and licensed by the state or certified in some other way? °· What does it cost? Is it covered by insurance? °· If I choose a hospice   center or nursing home, where is the hospice center located? Is it convenient for family and friends? °· If I choose a hospice center or nursing home, can my family and friends visit any time? °· Will you provide emotional and spiritual support? °· Who can my family call with questions? °Where can I learn more about hospice? °You can learn about existing hospice programs in your area  from your health care providers. You can also read more about hospice online. The websites of the following organizations have helpful information: °· National Hospice and Palliative Care Organization (NHPCO): www.nhpco.org °· National Association for Home Care & Hospice (NAHC): www.nahc.org °· Hospice Foundation of [employer] (HFA): www.hospicefoundation.org °· American Cancer Society (ACS): www.cancer.org °· Hospice Net: www.hospicenet.org °· Visiting Nurse Associations of [employer] (VNAA): www.vnaa.org °You may also find more information by contacting the following agencies: °· A local agency on aging. °· Your local [employer] chapter. °· Your state's department of health or social services. °Summary °· Hospice is a service that is designed to provide people who are terminally ill and their families with medical, spiritual, and psychological support. °· Hospice aims to improve your quality of life by keeping you as comfortable as possible in the final stages of life. °· Hospice teams often include a doctor, nurse, social worker, counselor, religious leader,dietitian, therapists, and volunteers. °· Hospice care generally includes medicine for symptom management, visits from doctors and nurses, physical and occupational therapy, nutrition counseling, spiritual and emotional counseling, caregiver support, and bereavement support for grieving family members. °· Hospice programs can take place in your home or at a hospice center, hospital, or skilled nursing facility. °This information is not intended to replace advice given to you by your health care provider. Make sure you discuss any questions you have with your health care provider. °Document Released: 10/15/2003 Document Revised: 06/10/2017 Document Reviewed: 07/20/2016 °Elsevier Patient Education © 2020 [employer] ° °

## 2019-05-02 NOTE — Progress Notes (Signed)
Patient seems agitated this morning, but due to d/c to hospice, feelings understandable. Patient believes that Pulmicort and Duonebs are causing him to have discomfort in his chest in the mornings. Scheduled txs D/C, but PRN available if needed/requested by patient. RN made aware. RT will assess as needed.

## 2019-05-02 NOTE — Discharge Summary (Signed)
Physician Discharge Summary  Tyler [name] TDD:[id number] DOB: 13-Jan-1961  PCP: Brand Males, MD  Admitted from: Home Discharged to: [facility]/residential hospice  Admit date: 04/19/2019 Discharge date: 05/02/2019  Recommendations for Outpatient Follow-up:   Follow-up Information    MD at [facility] Follow up.   Why: For ongoing residential hospice care.       Brand Males, MD Follow up.   Specialty: Pulmonary Disease Why: [place] Fresh information: [place] [id number] [phone]            Home Health: N/A Equipment/Devices: N/A    Discharge Condition: Stable.  Overall poor prognosis. CODE STATUS: DNR Diet recommendation: Heart healthy diet.  Discharge Diagnoses:  Principal Problem:   Aspiration pneumonia ([place]) Active Problems:   Acute respiratory failure with hypoxia (HCC)   Protein-calorie malnutrition, severe   Pulmonary embolism (HCC)   Loculated pleural effusion   PNA (pneumonia)   Malnutrition of moderate degree   Atrial fibrillation with RVR (HCC)   AF (paroxysmal atrial fibrillation) (HCC)   Malignant neoplasm of right lung [facility])   Brief Summary: 58 year old married male with PMH of recently diagnosed stage III lung cancer in July 2020, s/p radiation and chemotherapy, diagnosed with DVT and PE last month and on full dose Lovenox, tobacco and alcohol use, initially presented to ED on 04/19/2019 due to worsening right-sided chest pain, no acute findings noted on chest x-ray and patient was sent home on Percocet.  He returned later that day due to confusion, fever and dyspnea and he was admitted for sepsis secondary to pneumonia in the setting of malignancy.  Hospital course: CT chest showed loculated right pleural effusion concerning for parapneumonic effusion given neutrophilic exudate and low glucose and diagnostic studies.  Therapeutic/diagnostic thoracentesis on 10/9, 1.2 L removed, cultures negative.  His  case was discussed with thoracic surgery on 10/10 who felt that there was no indication for thoracotomy.  His case was discussed with IR for possible Pleurx catheter but given loculated findings pulmonary consultation was recommended.  Patient underwent IR guided chest tube and had TPA/DNase administered by CCM during the hospital stay.  Chest tube was eventually removed.  PMT was consulted for goals of care and given overall poor prognosis, after discussing with patient and family, patient opted for discharge to residential hospice.  Assessment and plan:  1. Atrial fibrillation: Briefly required IV Cardizem.  Currently rate controlled on oral metoprolol.  Remains on full dose Lovenox for VTE which would cover A. fib as well.  Defer decision regarding stopping anticoagulation during follow-up at residential hospice. 2. Loculated right pleural effusion, likely parapneumonic effusion and postobstructive pneumonia: He was transitioned from IV Unasyn to oral Augmentin and will complete total 14 days course after tomorrow's dose.  Chest tube was removed on 10/19.  Oxygen for comfort.  Overall poor prognosis.  PMT input from 10/20 appreciated, discharged to residential hospice when bed available today. 3. Pulmonary embolism/bilateral DVTs, malignancy related: Remains on full dose Lovenox, dose adjusted to weight.  Defer to residential hospice regarding stopping full dose Lovenox. 4. Acute on chronic hypoxic respiratory failure: Multifactorial due to lung cancer, pleural effusion.  Oxygen titrated to comfort. 5. Acute on chronic normocytic anemia: Iron panel consistent with anemia of chronic disease, likely related to malignancy and its treatment. 6. Hypokalemia/hypomagnesemia: Resolved. 7. Stage III non-small cell lung cancer, diagnosed 01/2019: Patient has evidence of disease progression despite chemoradiation treatment.  Dr. Julien Nordmann was made aware of patient status  by admitting physician.  They agreed that  patient has incurable metastatic cancer and met criteria for residential hospice given overall poor prognosis.   Consultations:  IR, pulmonology, oncology  Procedures:  IR guided chest tube placement, right side on 10/14  Therapeutic/diagnostic thoracentesis 10/9   Discharge Instructions  Discharge Instructions    Call MD for:  difficulty breathing, headache or visual disturbances   Complete by: As directed    Call MD for:  extreme fatigue   Complete by: As directed    Call MD for:  persistant dizziness or light-headedness   Complete by: As directed    Call MD for:  persistant nausea and vomiting   Complete by: As directed    Call MD for:  severe uncontrolled pain   Complete by: As directed    Call MD for:  temperature >100.4   Complete by: As directed    Diet - low sodium heart healthy   Complete by: As directed    Increase activity slowly   Complete by: As directed        Medication List    STOP taking these medications   ibuprofen 200 MG tablet Commonly known as: ADVIL   oxyCODONE-acetaminophen 5-325 MG tablet Commonly known as: Percocet     TAKE these medications   albuterol 108 (90 Base) MCG/ACT inhaler Commonly known as: VENTOLIN HFA Inhale 2 puffs into the lungs every 6 (six) hours as needed for wheezing or shortness of breath.   amoxicillin-clavulanate 875-125 MG tablet Commonly known as: AUGMENTIN Take 1 tablet by mouth 2 (two) times daily. Discontinue after 05/03/2019 doses.   enoxaparin 100 MG/ML injection Commonly known as: LOVENOX Inject 0.6 mLs (60 mg total) into the skin daily. Start taking on: May 03, 2019 What changed: how much to take   feeding supplement (ENSURE ENLIVE) Liqd Take 237 mLs by mouth 3 (three) times daily between meals. What changed: when to take this   lidocaine 2 % solution Commonly known as: XYLOCAINE 5 ml swallow before meals for radiation esophagitis What changed: additional instructions   Magnesium Oxide  400 MG Caps Take 1 capsule (400 mg total) by mouth 2 (two) times daily.   metoprolol tartrate 25 MG tablet Commonly known as: LOPRESSOR Take 1 tablet (25 mg total) by mouth 2 (two) times daily.   Oxycodone HCl 10 MG Tabs Take 1 tablet (10 mg total) by mouth every 3 (three) hours as needed for moderate pain or severe pain.   polyethylene glycol 17 g packet Commonly known as: MIRALAX / GLYCOLAX Take 17 g by mouth daily. Start taking on: May 03, 2019   potassium chloride SA 20 MEQ tablet Commonly known as: KLOR-CON Take 1 tablet (20 mEq total) by mouth daily.   sucralfate 1 g tablet Commonly known as: CARAFATE Take 1 g by mouth 4 (four) times daily -  with meals and at bedtime.   tiotropium 18 MCG inhalation capsule Commonly known as: Spiriva HandiHaler Place 1 capsule (18 mcg total) into inhaler and inhale daily.      No Known Allergies    Procedures/Studies: Dg Chest 1 View  Result Date: 04/20/2019 CLINICAL DATA:  Post right thoracentesis EXAM: CHEST  1 VIEW COMPARISON:  04/20/2019 FINDINGS: Right pleural effusion markedly improved. Small residual right effusion. Improvement in right lower lobe consolidation. No pneumothorax Diffuse bilateral airspace disease unchanged. Possible pneumonia or edema. No left effusion. IMPRESSION: No pneumothorax post right thoracentesis. Partial re-expansion right lower lobe Diffuse bilateral airspace disease unchanged compatible  with edema or pneumonia. Electronically Signed   By: Franchot Gallo M.D.   On: 04/20/2019 15:49   Dg Chest 2 View  Result Date: 04/24/2019 CLINICAL DATA:  58 year old male with history of lung cancer status post chemotherapy. EXAM: CHEST - 2 VIEW COMPARISON:  Chest x-ray 04/21/2019. FINDINGS: Large right pleural effusion with extensive atelectasis and/or consolidation in the base of the right lung. Small left pleural effusion. Patchy multifocal interstitial opacities widespread throughout the lungs bilaterally, with  relative sparing of the apex of the left upper lobe. Pulmonary vasculature does not appearing origin. Heart size is normal. Upper mediastinal contours are within normal limits. IMPRESSION: 1. Patchy multifocal interstitial and airspace disease in the lungs bilaterally, asymmetrically distributed, favored to reflect a multilobar pneumonia. 2. Large right pleural effusion with areas of atelectasis and/or consolidation in the right lung base. Electronically Signed   By: Vinnie Langton M.D.   On: 04/24/2019 17:02   Dg Chest 2 View  Result Date: 04/21/2019 CLINICAL DATA:  Shortness of breath. EXAM: CHEST - 2 VIEW COMPARISON:  April 20, 2019. FINDINGS: Stable cardiomediastinal silhouette. No pneumothorax is noted. Moderate right pleural effusion is noted which may be loculated. Right basilar atelectasis or infiltrate is noted. Stable left perihilar and basilar reticular opacity is noted which may represent edema or possibly scarring. Bony thorax unremarkable. IMPRESSION: Increased right pleural effusion is noted with associated right basilar atelectasis or infiltrate. Stable left midlung and basilar opacity as described above. Electronically Signed   By: Marijo Conception M.D.   On: 04/21/2019 11:32   Dg Chest 2 View  Result Date: 04/20/2019 CLINICAL DATA:  Shortness of breath with productive cough. History of lung cancer. EXAM: CHEST - 2 VIEW COMPARISON:  Radiographs and CT 04/19/2019. FINDINGS: Lateral view was repeated. Right pleural effusion has significantly reaccumulated compared with the prior studies. This is partially loculated inferolaterally. There are worsening airspace opacities at both lung bases which could reflect edema or inflammation. No pneumothorax or significant left pleural effusion. The visualized heart size and mediastinal contours are stable. No acute osseous findings. IMPRESSION: 1. Enlarging right pleural effusion with worsening airspace opacities at both lung bases which could reflect  edema or inflammation. 2. No other significant changes identified compared with prior studies. Electronically Signed   By: Richardean Sale M.D.   On: 04/20/2019 11:24   Dg Chest 2 View  Result Date: 04/06/2019 CLINICAL DATA:  Chest pain. EXAM: CHEST - 2 VIEW COMPARISON:  Radiograph of January 11, 2019. FINDINGS: The heart size and mediastinal contours are within normal limits. No pneumothorax is noted. Left lung is clear. Probable lower lobe atelectasis or pneumonia is noted with small right pleural effusion. The visualized skeletal structures are unremarkable. IMPRESSION: Right lower lobe opacity is noted most consistent with atelectasis or infiltrate with associated pleural effusion. Followup PA and lateral chest X-ray is recommended in 3-4 weeks following trial of antibiotic therapy to ensure resolution and exclude underlying malignancy. Electronically Signed   By: Marijo Conception M.D.   On: 04/06/2019 20:19   Ct Head Wo Contrast  Result Date: 04/19/2019 CLINICAL DATA:  Altered mental status EXAM: CT HEAD WITHOUT CONTRAST TECHNIQUE: Contiguous axial images were obtained from the base of the skull through the vertex without intravenous contrast. COMPARISON:  MR brain, 01/18/2019 FINDINGS: Brain: No evidence of acute infarction, hemorrhage, hydrocephalus, extra-axial collection or mass lesion/mass effect. The ventricular white matter hypodensity. Vascular: No hyperdense vessel or unexpected calcification. Skull: Normal. Negative for fracture or  focal lesion. Sinuses/Orbits: No acute finding. Other: None. IMPRESSION: No acute intracranial pathology.  Small-vessel white matter disease. Electronically Signed   By: Eddie Candle M.D.   On: 04/19/2019 15:02   Ct Chest Wo Contrast  Result Date: 04/29/2019 CLINICAL DATA:  Evaluate pleural effusion. History of lung cancer and previous right-sided chest tube placement. EXAM: CT CHEST WITHOUT CONTRAST TECHNIQUE: Multidetector CT imaging of the chest was performed  following the standard protocol without IV contrast. COMPARISON:  Chest CT-04/19/2019; 04/06/2019 FINDINGS: Cardiovascular: Borderline cardiomegaly. Coronary artery calcifications. No pericardial effusion. No evidence of thoracic aortic aneurysm. Atherosclerotic plaque involving the origin of the branch vessels of the aortic arch. No intramural hematoma. Mediastinum/Nodes: Evaluation for mediastinal and hilar adenopathy is degraded secondary lack of intravenous contrast. Lungs/Pleura: Stable positioning of right-sided chest tube with significant reduction of previously noted partially loculated right-sided pleural effusion. A small amount of fluid remains within the right major fissure. A small pneumothorax is seen about the anterior basilar aspect of the right hemithorax (image 33, series 7, potentially ex vacuo in etiology. Improved aeration of the right upper lobe however there are persistent air bronchograms about the right hilum and occlusion the right lower lobe bronchus with associated persistent near complete atelectasis/collapse of the right lower lobe. Unchanged approximately 0.8 cm right upper lobe nodule, similar to the 03/2019 examination and potentially representative of a intraparenchymal metastasis. Interval development of a trace left-sided pleural effusion with worsening interstitial opacities within the left lung and consolidative opacities associated air bronchograms within the left lower lobe. Upper Abdomen: Limited noncontrast the superior aspect of the right-sided nephrolithiasis which evaluation of the upper abdomen demonstrates measures at least 0.4 cm in diameter (image 167, series 2). Musculoskeletal: No acute or aggressive osseous abnormalities. Regional soft tissues appear normal. Normal appearance of the thyroid gland. IMPRESSION: 1. Interval reduction in size loculated right-sided pleural effusion post chest tube placement with improved aeration of the right upper lobe and tiny  right-sided pneumothorax, potentially ex vacuo in etiology. A small amount of fluid remains within the right major fissure. 2. Redemonstrated occlusion of the right lower lobe bronchus with associated near complete atelectasis/collapse of the right lower lobe likely attributable to provided history of lung cancer though a definitive parenchymal masses difficult to define secondary to consolidation and volume loss. 3. Interval development of a small left-sided effusion with worsening left basilar consolidative opacities, air bronchograms and interstitial thickening within the left lung, potentially representative asymmetric pulmonary edema though underlying infection is not excluded on the basis of this examination. Clinical correlation is advised. 4. Cardiomegaly. 5. Coronary calcifications.  Aortic Atherosclerosis (ICD10-I70.0). Electronically Signed   By: Sandi Mariscal M.D.   On: 04/29/2019 09:42   Ct Chest W Contrast  Addendum Date: 04/06/2019   ADDENDUM REPORT: 04/06/2019 16:44 ADDENDUM: Critical Value/emergent results were called by telephone at the time of interpretation on 04/06/2019 at 4:43 pm to the [facility] triage nurse Elmer Picker, who verbally acknowledged these results. Electronically Signed   By: Kerby Moors M.D.   On: 04/06/2019 16:44   Result Date: 04/06/2019 CLINICAL DATA:  Restaging right lung cancer. Right supraclavicular pain and cough. EXAM: CT CHEST WITH CONTRAST TECHNIQUE: Multidetector CT imaging of the chest was performed during intravenous contrast administration. CONTRAST:  95m OMNIPAQUE IOHEXOL 300 MG/ML  SOLN COMPARISON:  CT chest 01/08/2019 and PET-CT 12/27/2018 FINDINGS: Cardiovascular: Normal heart size. No pericardial effusion. Mild aortic atherosclerosis. RCA and left circumflex coronary artery calcifications. There is a left  upper lobe segmental branch pulmonary artery filling defect, image 76/2. This is compatible with acute pulmonary embolus.  Additional PE identified within a segmental branch of the left lower lobe pulmonary artery, image 101/2 Mediastinum/Nodes: Normal appearance of the thyroid gland. The trachea appears patent and is midline. Normal appearance of the esophagus. No mediastinal or left hilar adenopathy. 1 cm right hilar lymph node is identified. Increased from 0.7 cm previously. Lungs/Pleura: Centrilobular and paraseptal emphysema. New moderate right pleural effusion and small left pleural effusion. Significant interval increase in tumor burden within the right lower lobe which measures 8.6 x 6.7 by 6.4 cm, image 112/2 and image 52/7. On the sagittal images this has a AP diameter 9.7 cm spanning the right lower lobe and right middle lobe. Adjacent ground-glass attenuation and interlobular septal thickening noted concerning for lymphangitic spread. On the previous exam this lesion was limited to the central right lower lobe measuring 1.9 x 2.5 by 1.5 cm. Tumor extension into the right infrahilar region with encasement of the right lower lobe and right middle lobe bronchi identified with postobstructive pneumonitis. New tumor within the central right middle lobe measures 4.4 x 2.5 by 5.4 cm, image 117/2 and image 25/6. Solid nodule within the right upper lobe measures 8 mm, image 31/5. Unchanged from previous exam. Thin walled cavitary nodule within the posterior left upper lobe measures 4 mm, image 43/5. Previously this measured 6 mm. Unchanged, ill-defined part solid nodule in the lateral aspect of the superior segment of left lower lobe measuring 1 cm, image 78/5. Triangular-shaped, subpleural nodule in the posterior left lower lobe is new measuring 1.4 cm and may represent an area of pulmonary infarct is this is in the distribution of the left lower lobe pulmonary embolus. Upper Abdomen: No acute abnormality. Musculoskeletal: No acute or aggressive bone lesions identified. IMPRESSION: 1. Considerable increase in tumor burden involving  the left lower lobe and now the right middle lobe. There is overlying ground-glass attenuation and interlobular septal thickening concerning for lymphangitic spread of disease. 2. New bilateral pleural effusions right greater than left. 3. Additional small solid and sub solid nodules involving the right upper lobe and left lung are not significantly changed in the interval. 4. Left upper lobe and left lower lobe segmental branch acute pulmonary artery emboli. Electronically Signed: By: Kerby Moors M.D. On: 04/06/2019 15:47   Ct Angio Chest Pe W Or Wo Contrast  Result Date: 04/19/2019 CLINICAL DATA:  Chest pain and respiratory distress. History of lung cancer. Discharge home was a long emergency room today. Diagnosed with pulmonary embolism last week. EXAM: CT ANGIOGRAPHY CHEST WITH CONTRAST TECHNIQUE: Multidetector CT imaging of the chest was performed using the standard protocol during bolus administration of intravenous contrast. Multiplanar CT image reconstructions and MIPs were obtained to evaluate the vascular anatomy. CONTRAST:  150m OMNIPAQUE IOHEXOL 350 MG/ML SOLN COMPARISON:  04/06/2019 and 12/11/2018 FINDINGS: Cardiovascular: Mild stable cardiomegaly. Thoracic aorta is normal in caliber. Pulmonary arterial system is well opacified as the previously seen small proximal left-sided pulmonary emboli are no longer visualized. No current pulmonary emboli are visualized bilaterally. Remaining vascular structures are unremarkable. Mediastinum/Nodes: No definite adenopathy visualized. Remaining mediastinal structures are unremarkable. Lungs/Pleura: Lungs are adequately inflated demonstrate worsening of a moderate size right pleural effusion. Moderate consolidation over the right lower lobe slightly worse likely combination of atelectasis as well as patient's known right lung cancer. This is difficult to measure due to the adjacent consolidation and effusion. Stable mass over the anteromedial right midlung.  Several small stable nodule opacities over the right upper lobe. Patchy mixed interstitial airspace density over the anterior right midlung. Worsening patchy airspace density over the superior segment of the left lower lobe which may be due to infection. Remainder of the left lung is unchanged. Biapical pleural thickening. Obstruction of several proximal right middle lobe and lower lobe bronchi which is a new finding and most likely due to aspiration or mucous plugging. Narrowing of the posterior right pulmonary vein in the region of patient's medial right infrahilar lung cancer. Mild emphysematous disease. Suggestion of aspirate material within the right-sided the distal trachea. Upper Abdomen: No acute findings. Musculoskeletal: No change. Review of the MIP images confirms the above findings. IMPRESSION: 1. Stable changes of patient's known right lower lobe lung cancer which is difficult to define due to the adjacent consolidation and effusion. Stable masslike consolidation over the medial aspect of the anterior right midlung abutting the pericardium. Stable right upper lobe nodules suggesting metastatic disease. Worsening moderate size right pleural effusion with slight worsening consolidation in the right base. New obstruction of right middle lobe and lower lobe bronchi likely due to aspiration or mucous plugging. Narrowing of right posterior pulmonary vein. 2. Mild worsening patchy airspace process over the superior segment left lower lobe as well as anterior right midlung which may be due to infection. 3. Previously seen small left-sided pulmonary emboli no longer visualized. No current pulmonary emboli identified. 4.  Mild emphysematous disease.  Mild cardiomegaly. Electronically Signed   By: Marin Olp M.D.   On: 04/19/2019 15:25   Dg Chest Port 1 View  Result Date: 04/28/2019 CLINICAL DATA:  Right-sided pleural effusion with right-sided chest tube. History of lung cancer. EXAM: PORTABLE CHEST 1 VIEW  COMPARISON:  04/27/2019; 04/26/2019; chest CT-04/19/2019 FINDINGS: Grossly unchanged cardiac silhouette and mediastinal contours. Stable positioning of support apparatus with no change to slight increase in size of small potentially partially loculated right-sided pleural effusion. Slight worsening of bibasilar heterogeneous opacities. Ill-defined heterogeneous opacities are again seen with the peripheral aspect of the left mid lung. No pneumothorax. No definite evidence of edema. No acute osseous abnormalities. IMPRESSION: 1.  Stable positioning of support apparatus.  No pneumothorax. 2. Suspected slight increase in size of small right-sided pleural effusion and worsening bibasilar opacities, atelectasis versus infiltrate. Electronically Signed   By: Sandi Mariscal M.D.   On: 04/28/2019 05:59   Dg Chest Port 1 View  Result Date: 04/27/2019 CLINICAL DATA:  Acute respiratory failure. EXAM: PORTABLE CHEST 1 VIEW COMPARISON:  04/26/2019 FINDINGS: Stable position of the right basilar chest tube. Improved aeration at the right lung base suggesting decreased right pleural effusion. Persistent linear and patchy densities at the right lung base. No evidence for pneumothorax. Hazy interstitial densities in the left lung are unchanged. Heart size is within normal limits and stable. The trachea is midline. IMPRESSION: 1. Improved aeration of the right lung base compatible with decreased right pleural fluid. Right chest tube is in a stable position. 2. Negative for pneumothorax. 3. Linear and nodular right basilar densities are nonspecific. Electronically Signed   By: Markus Daft M.D.   On: 04/27/2019 09:29   Dg Chest Port 1 View  Result Date: 04/26/2019 CLINICAL DATA:  Pleural effusion EXAM: PORTABLE CHEST 1 VIEW COMPARISON:  04/24/2019 FINDINGS: Right basilar pleural chest tube in place with decreasing right effusion. Moderate right effusion remains. SPECT small right apical pneumothorax. Right base atelectasis or  infiltrate. Diffuse interstitial prominence throughout the lungs. Heart is normal size.  IMPRESSION: Decreasing right effusion following chest tube placement. Suspect small right apical pneumothorax with continued moderate right pleural effusion. Right basilar atelectasis or infiltrate. Stable diffuse interstitial prominence throughout the lungs. Electronically Signed   By: Rolm Baptise M.D.   On: 04/26/2019 09:51   Dg Chest Port 1 View  Result Date: 04/19/2019 CLINICAL DATA:  Right chest wall pain.  History of lung cancer EXAM: PORTABLE CHEST 1 VIEW COMPARISON:  04/07/2019 FINDINGS: Right base airspace disease and small pleural effusion with increased pleural fluid from comparison chest x-ray. No pneumothorax. Normal heart size. IMPRESSION: 1. Small right pleural effusion with reaccumulation since 04/07/2019. 2. Right base consolidation/mass, reference 04/06/2019 CT. Electronically Signed   By: Monte Fantasia M.D.   On: 04/19/2019 04:07   Dg Chest Port 1 View  Result Date: 04/07/2019 CLINICAL DATA:  Status post right thoracentesis. EXAM: PORTABLE CHEST 1 VIEW COMPARISON:  Film earlier today at 0800 hours FINDINGS: In the frontal projection there appears to be complete evacuation of right pleural fluid. Underlying atelectasis/consolidation at the right lung base remains. No pneumothorax or pulmonary edema. Stable cardiac enlargement. IMPRESSION: No pneumothorax after right thoracentesis with complete evacuation of right pleural fluid. Underlying atelectasis and consolidation remains at the right lung base. Electronically Signed   By: Aletta Edouard M.D.   On: 04/07/2019 13:59   Ct Perc Pleural Drain W/indwell Cath W/img Guide  Result Date: 04/25/2019 CLINICAL DATA:  Lung carcinoma with loculated right pleural effusion, shortness of breath. EXAM: CT GUIDED CHEST DRAIN PLACEMENT ANESTHESIA/SEDATION: Intravenous Fentanyl 54mg and Versed 190mwere administered as conscious sedation during continuous  monitoring of the patient's level of consciousness and physiological / cardiorespiratory status by the radiology RN, with a total moderate sedation time of 26 minutes. PROCEDURE: The procedure, risks, benefits, and alternatives were explained to the patient. Questions regarding the procedure were encouraged and answered. The patient understands and consents to the procedure. Patient placed in left lateral decubitus position. Select axial scans through the thorax performed in the loculated right pleural effusion was localized. An appropriate skin entry site was determined and marked. The operative field was prepped with chlorhexidinein a sterile fashion, and a sterile drape was applied covering the operative field. A sterile gown and sterile gloves were used for the procedure. Local anesthesia was provided with 1% Lidocaine. Percutaneous entry needle advanced into the right pleural space. Fluid easily aspirated. Amplatz wire advanced easily, its position confirmed on CT fluoroscopy. Tract dilated to facilitate placement of a 14 French pigtail drain catheter, placed within the posterior dependent aspect of the collection. Catheter position confirmed on CT. Catheter was secured externally with 0 Prolene suture and StatLock and placed SaWalworthThe patient tolerated the procedure well. COMPLICATIONS: None immediate FINDINGS: Loculated right pleural effusion was localized. 14 French pigtail drain catheter placed as above. IMPRESSION: 1. Technically successful CT-guided placement of right chest drain catheter. Electronically Signed   By: D Lucrezia Europe.D.   On: 04/25/2019 15:23   Vas [place] Extremity Venous (dvt)  Result Date: 04/08/2019  Lower Venous Study Indications: Pulmonary embolism.  Risk Factors: Confirmed PE Cancer [place] - Lung. Comparison Study: No previous study available Performing Technologist: ViToma CopierVS  Examination Guidelines: A complete evaluation includes B-mode imaging, spectral  Doppler, color Doppler, and power Doppler as needed of all accessible portions of each vessel. Bilateral testing is considered an integral part of a complete examination. Limited examinations for reoccurring indications may be performed as noted.  +---------+---------------+---------+-----------+----------+-------------------+ RIGHT  CompressibilityPhasicitySpontaneityPropertiesThrombus Aging      +---------+---------------+---------+-----------+----------+-------------------+ CFV      Full           Yes      Yes                                      +---------+---------------+---------+-----------+----------+-------------------+ SFJ      Full                                                             +---------+---------------+---------+-----------+----------+-------------------+ FV Prox  Full           Yes      Yes                                      +---------+---------------+---------+-----------+----------+-------------------+ FV Mid   Full                                                             +---------+---------------+---------+-----------+----------+-------------------+ FV DistalFull           Yes      Yes                                      +---------+---------------+---------+-----------+----------+-------------------+ PFV      Full           Yes      Yes                                      +---------+---------------+---------+-----------+----------+-------------------+ POP      Partial        Yes      Yes                  Acute -Phasic in                                                          the proximal                                                              region.             +---------+---------------+---------+-----------+----------+-------------------+ PTV      Partial  Acute mid to                                                              proximal             +---------+---------------+---------+-----------+----------+-------------------+ PERO                                                  Not visualized      +---------+---------------+---------+-----------+----------+-------------------+   Right Technical Findings: Not visualized segments include Peroneal. Unable to visualize the peroneal vein. Mild technical difficulty throughout due to acoustic shadowing from calcification of the arteries  +---------+---------------+---------+-----------+----------+------------------+ LEFT     CompressibilityPhasicitySpontaneityPropertiesThrombus Aging     +---------+---------------+---------+-----------+----------+------------------+ CFV      Full           Yes      Yes                                     +---------+---------------+---------+-----------+----------+------------------+ SFJ      Full                                                            +---------+---------------+---------+-----------+----------+------------------+ FV Prox  Full           Yes      Yes                                     +---------+---------------+---------+-----------+----------+------------------+ FV Mid   Full           Yes      Yes                                     +---------+---------------+---------+-----------+----------+------------------+ FV DistalPartial                                      Acute diatal to                                                          mid                +---------+---------------+---------+-----------+----------+------------------+ PFV      Full           Yes      Yes                                     +---------+---------------+---------+-----------+----------+------------------+  POP      Partial                                      Acute              +---------+---------------+---------+-----------+----------+------------------+ PTV      Partial                                       Acute - Mid to                                                           proximal           +---------+---------------+---------+-----------+----------+------------------+ PERO     Full                                                            +---------+---------------+---------+-----------+----------+------------------+   Left Technical Findings: Difficult to visualize the peroneal veins. Mld technical difficulty due to acoustic shadowing calification of the arteries.   Summary: Right: Findings consistent with acute deep vein thrombosis involving the right popliteal vein, and right posterior tibial veins. See technical findings listed above Left: Findings consistent with acute deep vein thrombosis involving the left posterior tibial veins, left popliteal vein, and left femoral vein. See technical findings listed above.  *See table(s) above for measurements and observations. Electronically signed by Curt Jews MD on 04/08/2019 at 9:47:50 AM.    Final    US Thoracentesis Asp Pleural Space W/img Guide  Result Date: 04/20/2019 INDICATION: Patient with history of lung cancer, dyspnea, recurrent right pleural effusion. Request made for diagnostic and therapeutic right thoracentesis. EXAM: ULTRASOUND GUIDED DIAGNOSTIC AND THERAPEUTIC RIGHT THORACENTESIS MEDICATIONS: None COMPLICATIONS: None immediate. PROCEDURE: An ultrasound guided thoracentesis was thoroughly discussed with the patient and questions answered. The benefits, risks, alternatives and complications were also discussed. The patient understands and wishes to proceed with the procedure. Written consent was obtained. Ultrasound was performed to localize and mark an adequate pocket of fluid in the right chest. The area was then prepped and draped in the normal sterile fashion. 1% Lidocaine was used for local anesthesia. Under ultrasound guidance a 6 Fr Safe-T-Centesis catheter was introduced. Thoracentesis was performed. The catheter was  removed and a dressing applied. FINDINGS: A total of approximately 1.2 liters of hazy, yellow fluid was removed. Samples were sent to the laboratory as requested by the clinical team. The pleural fluid collection was multiloculated. IMPRESSION: Successful ultrasound guided diagnostic and therapeutic right thoracentesis yielding 1.2 liters of pleural fluid. Read by: Rowe Robert, PA-C Electronically Signed   By: Jacqulynn Cadet M.D.   On: 04/20/2019 15:26   US Thoracentesis Asp Pleural Space W/img Guide  Result Date: 04/07/2019 INDICATION: Patient with history of adenocarcinoma of right lung, pulmonary embolism of left lung, dyspnea, and right pleural effusion. Request is made for diagnostic and therapeutic right thoracentesis. EXAM: ULTRASOUND GUIDED DIAGNOSTIC AND THERAPEUTIC RIGHT THORACENTESIS MEDICATIONS: 10 mL of  1% lidocaine COMPLICATIONS: None immediate. PROCEDURE: An ultrasound guided thoracentesis was thoroughly discussed with the patient and questions answered. The benefits, risks, alternatives and complications were also discussed. The patient understands and wishes to proceed with the procedure. Written consent was obtained. Ultrasound was performed to localize and mark an adequate pocket of fluid in the right chest. The area was then prepped and draped in the normal sterile fashion. 1% Lidocaine was used for local anesthesia. Under ultrasound guidance a 6 Fr Safe-T-Centesis catheter was introduced. Thoracentesis was performed. The catheter was removed and a dressing applied. FINDINGS: A total of approximately 450 mL of clear yellow fluid was removed. Samples were sent to the laboratory as requested by the clinical team. IMPRESSION: Successful ultrasound guided right thoracentesis yielding 450 mL of pleural fluid. Read by: Earley Abide, PA-C Electronically Signed   By: Aletta Edouard M.D.   On: 04/07/2019 14:04       Subjective: Patient interviewed and examined along with spouse at  bedside.  Reports some visual abnormalities after taking a dose of IV fentanyl this morning.  Indicates that the doors to his room and covered and walls appeared to be slanting.  This resolved after an hour or 2 after the injection.  Reportedly had similar complaints a couple days back which resolved spontaneously.  No pain reported during my visit.  As per RN and spouse at bedside, no acute issues reported.  Discharge Exam:  Vitals:   05/01/19 2147 05/02/19 0500 05/02/19 0653 05/02/19 0800  BP: 95/61  117/72   Pulse: 89  82   Resp: 17  18   Temp: 98.4 F (36.9 C)  97.9 F (36.6 C)   TempSrc: Oral  Oral   SpO2: 100%  100% 99%  Weight:  36.9 kg    Height:        General: Pt lying comfortably in bed & appears in no obvious distress. Cardiovascular: S1 & S2 heard, RRR, S1/S2 +. No murmurs, rubs, gallops or clicks. No JVD or pedal edema. Respiratory: Reduced breath sounds bilaterally but no wheezing, rhonchi or crackles.  No increased work of breathing. Abdominal:  Non distended, non tender & soft. No organomegaly or masses appreciated. Normal bowel sounds heard. CNS: Alert and oriented x2. No focal deficits. Extremities: no edema, no cyanosis    The results of significant diagnostics from this hospitalization (including imaging, microbiology, ancillary and laboratory) are listed below for reference.     Microbiology: No results found for this or any previous visit (from the past 240 hour(s)).   Labs: CBC: Recent Labs  Lab 04/28/19 0533 04/29/19 0542 04/30/19 0514 05/01/19 0701 05/02/19 0539  WBC 17.4* 20.1* 13.9* 11.3* 10.4  HGB 7.3* 7.0* 8.0* 7.4* 7.5*  HCT 23.0* 21.7* 25.8* 24.1* 24.4*  MCV 84.2 83.8 86.3 86.1 86.2  PLT 236 272 270 293 546   Basic Metabolic Panel: Recent Labs  Lab 04/26/19 0531 04/27/19 0533 04/30/19 0514 05/01/19 0701  NA 134* 136 136 134*  K 3.3* 4.1 4.1 4.0  CL 97* 99 99 98  CO2 29 28 29 29   GLUCOSE 93 108* 91 97  BUN 8 10 6 6    CREATININE 0.32* 0.47* 0.37* 0.57*  CALCIUM 7.4* 8.1* 7.9* 7.7*  MG 1.7  --  1.6* 1.7   BNP (last 3 results) Recent Labs    04/06/19 2217 04/19/19 1242  BNP 251.3* 136.6*   Urinalysis    Component Value Date/Time   COLORURINE AMBER (A) 04/19/2019 1513   APPEARANCEUR  CLOUDY (A) 04/19/2019 1513   LABSPEC 1.023 04/19/2019 1513   PHURINE 5.0 04/19/2019 1513   GLUCOSEU NEGATIVE 04/19/2019 1513   HGBUR SMALL (A) 04/19/2019 1513   BILIRUBINUR NEGATIVE 04/19/2019 1513   KETONESUR NEGATIVE 04/19/2019 1513   PROTEINUR 100 (A) 04/19/2019 1513   NITRITE NEGATIVE 04/19/2019 1513   LEUKOCYTESUR MODERATE (A) 04/19/2019 1513    I discussed in detail with patient spouse at bedside, updated care and answered questions.  Time coordinating discharge: 45 minutes  SIGNED:  Vernell Leep, MD, FACP, [facility]. [facility]  To contact the attending provider between 7A-7P or the covering provider during after hours 7P-7A, please log into the web site [email or web address] and access using universal [place] password for that web site. If you do not have the password, please call the hospital operator.

## 2019-05-02 NOTE — Progress Notes (Signed)
Manufacturing engineer [facility]) Hospital Liaison Note.  Met with patient and wife to complete paperwork. Please call report to [phone].  Please arrange transportation to [facility] via [place].  Thank you, Farrel Gordon, [facility] (listed on [place]) [phone]

## 2019-05-02 NOTE — TOC Transition Note (Signed)
Transition of Care [facility]) - CM/SW Discharge Note   Patient Details  Name: [name] MRN: [id number] Date of Birth: Jan 18, 1961  Transition of [facility]) CM/SW Contact:  Trish Mage, LCSW Phone Number: 05/02/2019, 2:02 PM   Clinical Narrative:  Pt to transfer to [facility] today. Nursing already called report to [phone].   Transportation to [employer] via [place] arranged.  TOC sign off.     Final next level of care: [place] Barriers to Discharge: Hospice Bed not available   Patient Goals and CMS Choice        Discharge Placement                       Discharge Plan and Services                                     Social Determinants of Health (SDOH) Interventions     Readmission Risk Interventions No flowsheet data found.

## 2019-05-07 ENCOUNTER — Telehealth: Payer: Self-pay | Admitting: Medical Oncology

## 2019-05-07 DIAGNOSIS — C3491 Malignant neoplasm of unspecified part of right bronchus or lung: Secondary | ICD-10-CM

## 2019-05-07 LAB — CD19 AND CD20, FLOW CYTOMETRY

## 2019-05-07 NOTE — Telephone Encounter (Signed)
Disability-He needs assistance with disability.    r/s appt -states he is leaving hospital tomorrow.

## 2019-05-07 NOTE — Telephone Encounter (Signed)
Next week is good with me or Cassie.

## 2019-05-08 ENCOUNTER — Telehealth: Payer: Self-pay | Admitting: Internal Medicine

## 2019-05-08 ENCOUNTER — Encounter: Payer: Self-pay | Admitting: *Deleted

## 2019-05-08 NOTE — Telephone Encounter (Signed)
Scheduled appt per 10/27 sch message - pt aware of appt date and time.

## 2019-05-08 NOTE — Telephone Encounter (Signed)
Scheduling message sent. 

## 2019-05-08 NOTE — Progress Notes (Signed)
[place] Work  Clinical Social Work was referred by medical oncology for assessment of psychosocial needs, specifically disability assistance.  Clinical Social Worker contacted patient by phone  to offer support and assess for needs. Mr. Laduca informed CSW he discharged home today from [facility].  The patient stated he needs assistance applying for disability.    CSW contacted Webb Batten, regarding patient's needs.  CSW left a VM with [facility] financial counseling to determine whether they have started any applications on patient's behalf.       Gwinda Maine, LCSW  Clinical Social Worker [facility]

## 2019-05-09 ENCOUNTER — Encounter: Payer: Self-pay | Admitting: *Deleted

## 2019-05-09 NOTE — Progress Notes (Signed)
[place] Work  Clinical Social Work communicated with inpatient financial counselor, Laverta Baltimore, she reported patient was approved for Medicaid, but still awaiting decision from Disability Determination services. It was determined patient has a Chief Executive Officer assisting with disability determintation. CSW contacted patient by phone and informed him he needs to contact his attorney to report new medical changes and expedite disability decision.  Gwinda Maine, LCSW  Clinical Social Worker [facility]

## 2019-05-11 ENCOUNTER — Telehealth: Payer: Self-pay

## 2019-05-11 NOTE — Telephone Encounter (Signed)
TC from Pt stating he needed to speak with Lauren in Social work. Pt stated that he had some information to give to her. Pt's call was transferred to leave a message on Lauren's voice mail.

## 2019-05-13 DIAGNOSIS — C3491 Malignant neoplasm of unspecified part of right bronchus or lung: Secondary | ICD-10-CM | POA: Diagnosis not present

## 2019-05-14 DIAGNOSIS — C3491 Malignant neoplasm of unspecified part of right bronchus or lung: Secondary | ICD-10-CM | POA: Diagnosis not present

## 2019-05-15 ENCOUNTER — Inpatient Hospital Stay: Payer: Medicaid Other | Attending: Internal Medicine | Admitting: Internal Medicine

## 2019-05-15 ENCOUNTER — Other Ambulatory Visit: Payer: Self-pay

## 2019-05-15 ENCOUNTER — Telehealth: Payer: Self-pay | Admitting: *Deleted

## 2019-05-15 ENCOUNTER — Inpatient Hospital Stay: Payer: Medicaid Other

## 2019-05-15 ENCOUNTER — Encounter: Payer: Self-pay | Admitting: Internal Medicine

## 2019-05-15 ENCOUNTER — Telehealth: Payer: Self-pay | Admitting: Internal Medicine

## 2019-05-15 VITALS — BP 116/81 | HR 128 | Temp 98.3°F | Resp 18 | Ht 74.0 in | Wt 124.6 lb

## 2019-05-15 DIAGNOSIS — R630 Anorexia: Secondary | ICD-10-CM | POA: Diagnosis not present

## 2019-05-15 DIAGNOSIS — Z5112 Encounter for antineoplastic immunotherapy: Secondary | ICD-10-CM | POA: Insufficient documentation

## 2019-05-15 DIAGNOSIS — J449 Chronic obstructive pulmonary disease, unspecified: Secondary | ICD-10-CM | POA: Diagnosis not present

## 2019-05-15 DIAGNOSIS — R0602 Shortness of breath: Secondary | ICD-10-CM | POA: Insufficient documentation

## 2019-05-15 DIAGNOSIS — R1310 Dysphagia, unspecified: Secondary | ICD-10-CM | POA: Diagnosis not present

## 2019-05-15 DIAGNOSIS — C3491 Malignant neoplasm of unspecified part of right bronchus or lung: Secondary | ICD-10-CM

## 2019-05-15 DIAGNOSIS — Z86711 Personal history of pulmonary embolism: Secondary | ICD-10-CM | POA: Diagnosis not present

## 2019-05-15 DIAGNOSIS — J90 Pleural effusion, not elsewhere classified: Secondary | ICD-10-CM | POA: Diagnosis not present

## 2019-05-15 DIAGNOSIS — E860 Dehydration: Secondary | ICD-10-CM | POA: Insufficient documentation

## 2019-05-15 DIAGNOSIS — Z9889 Other specified postprocedural states: Secondary | ICD-10-CM | POA: Diagnosis not present

## 2019-05-15 DIAGNOSIS — C3431 Malignant neoplasm of lower lobe, right bronchus or lung: Secondary | ICD-10-CM | POA: Diagnosis present

## 2019-05-15 DIAGNOSIS — Z9221 Personal history of antineoplastic chemotherapy: Secondary | ICD-10-CM | POA: Insufficient documentation

## 2019-05-15 DIAGNOSIS — Z7901 Long term (current) use of anticoagulants: Secondary | ICD-10-CM | POA: Insufficient documentation

## 2019-05-15 DIAGNOSIS — Z5111 Encounter for antineoplastic chemotherapy: Secondary | ICD-10-CM | POA: Insufficient documentation

## 2019-05-15 DIAGNOSIS — Z7189 Other specified counseling: Secondary | ICD-10-CM | POA: Diagnosis not present

## 2019-05-15 DIAGNOSIS — Z79899 Other long term (current) drug therapy: Secondary | ICD-10-CM | POA: Insufficient documentation

## 2019-05-15 LAB — CBC WITH DIFFERENTIAL (CANCER CENTER ONLY)
Abs Immature Granulocytes: 0.04 10*3/uL (ref 0.00–0.07)
Basophils Absolute: 0.1 10*3/uL (ref 0.0–0.1)
Basophils Relative: 1 %
Eosinophils Absolute: 0 10*3/uL (ref 0.0–0.5)
Eosinophils Relative: 0 %
HCT: 26.2 % — ABNORMAL LOW (ref 39.0–52.0)
Hemoglobin: 7.9 g/dL — ABNORMAL LOW (ref 13.0–17.0)
Immature Granulocytes: 1 %
Lymphocytes Relative: 21 %
Lymphs Abs: 1.8 10*3/uL (ref 0.7–4.0)
MCH: 25.8 pg — ABNORMAL LOW (ref 26.0–34.0)
MCHC: 30.2 g/dL (ref 30.0–36.0)
MCV: 85.6 fL (ref 80.0–100.0)
Monocytes Absolute: 1.1 10*3/uL — ABNORMAL HIGH (ref 0.1–1.0)
Monocytes Relative: 13 %
Neutro Abs: 5.6 10*3/uL (ref 1.7–7.7)
Neutrophils Relative %: 64 %
Platelet Count: 377 10*3/uL (ref 150–400)
RBC: 3.06 MIL/uL — ABNORMAL LOW (ref 4.22–5.81)
RDW: 18.9 % — ABNORMAL HIGH (ref 11.5–15.5)
WBC Count: 8.5 10*3/uL (ref 4.0–10.5)
nRBC: 0 % (ref 0.0–0.2)

## 2019-05-15 LAB — CMP (CANCER CENTER ONLY)
ALT: 13 U/L (ref 0–44)
AST: 20 U/L (ref 15–41)
Albumin: 1.9 g/dL — ABNORMAL LOW (ref 3.5–5.0)
Alkaline Phosphatase: 146 U/L — ABNORMAL HIGH (ref 38–126)
Anion gap: 11 (ref 5–15)
BUN: 7 mg/dL (ref 6–20)
CO2: 24 mmol/L (ref 22–32)
Calcium: 8.1 mg/dL — ABNORMAL LOW (ref 8.9–10.3)
Chloride: 103 mmol/L (ref 98–111)
Creatinine: 0.53 mg/dL — ABNORMAL LOW (ref 0.61–1.24)
GFR, Est AFR Am: >60 mL/min (ref >60)
GFR, Estimated: >60 mL/min (ref >60)
Glucose, Bld: 72 mg/dL (ref 70–99)
Potassium: 3.4 mmol/L — ABNORMAL LOW (ref 3.5–5.1)
Sodium: 138 mmol/L (ref 135–145)
Total Bilirubin: 0.3 mg/dL (ref 0.3–1.2)
Total Protein: 7.5 g/dL (ref 6.5–8.1)

## 2019-05-15 LAB — MAGNESIUM: Magnesium: 1.4 mg/dL — CL (ref 1.7–2.4)

## 2019-05-15 MED ORDER — CYANOCOBALAMIN 1000 MCG/ML IJ SOLN
INTRAMUSCULAR | Status: AC
  Filled 2019-05-15: qty 1

## 2019-05-15 MED ORDER — CYANOCOBALAMIN 1000 MCG/ML IJ SOLN
1000.0000 ug | Freq: Once | INTRAMUSCULAR | Status: AC
  Administered 2019-05-15: 1000 ug via INTRAMUSCULAR

## 2019-05-15 MED ORDER — FOLIC ACID 1 MG PO TABS: 1.0000 mg | ORAL_TABLET | Freq: Every day | ORAL | 4 refills | Status: AC

## 2019-05-15 MED ORDER — MAGNESIUM OXIDE 400 (241.3 MG) MG PO TABS: 400.0000 mg | ORAL_TABLET | Freq: Two times a day (BID) | ORAL | 0 refills | Status: AC

## 2019-05-15 MED ORDER — PROCHLORPERAZINE MALEATE 10 MG PO TABS: 10.0000 mg | ORAL_TABLET | Freq: Four times a day (QID) | ORAL | 0 refills | Status: DC | PRN

## 2019-05-15 NOTE — Progress Notes (Signed)
DISCONTINUE ON PATHWAY REGIMEN - Non-Small Cell Lung     Administer weekly:     Paclitaxel      Carboplatin   **Always confirm dose/schedule in your pharmacy ordering system**  REASON: Disease Progression PRIOR TREATMENT: [id number]: Carboplatin AUC=2 + Paclitaxel 45 mg/m2 Weekly During Radiation TREATMENT RESPONSE: Progressive Disease (PD)  START ON PATHWAY REGIMEN - Non-Small Cell Lung     A cycle is every 21 days:     Pembrolizumab      Pemetrexed      Carboplatin   **Always confirm dose/schedule in your pharmacy ordering system**  Patient Characteristics: Stage IV Metastatic, Nonsquamous, Initial Chemotherapy/Immunotherapy, PS = 0, 1, ALK Rearrangement Negative and EGFR Mutation Negative/Non-Sensitizing, PD-L1 Expression Positive 1-49% (TPS) / Negative / Not Tested / Awaiting Test Results and  Immunotherapy Candidate AJCC T Category: T1b Current Disease Status: Distant Metastases AJCC N Category: N2 AJCC M Category: M1a AJCC 8 Stage Grouping: IVA Histology: Nonsquamous Cell ROS1 Rearrangement Status: Negative T790M Mutation Status: Not Applicable - EGFR Mutation Negative/Unknown Other Mutations/Biomarkers: No Other Actionable Mutations Chemotherapy/Immunotherapy LOT: Initial Chemotherapy/Immunotherapy Molecular Targeted Therapy: Not Appropriate MET Exon 14 Mutation Status: Negative RET Gene Fusion Status: Negative EGFR Mutation Status: Negative/Wild Type NTRK Gene Fusion Status: Negative PD-L1 Expression Status: Quantity Not Sufficient ALK Rearrangement Status: Negative BRAF V600E Mutation Status: Negative ECOG Performance Status: 1 Immunotherapy Candidate Status: Candidate for Immunotherapy Intent of Therapy: Non-Curative / Palliative Intent, Discussed with Patient

## 2019-05-15 NOTE — Telephone Encounter (Signed)
Scheduled per 11/03 los, patient's son is notified. Patient will receive mailed copy of calender.

## 2019-05-15 NOTE — Progress Notes (Signed)
[place] [phone]   [phone]  OFFICE PROGRESS NOTE  Brand Males, MD [place] [address]  DIAGNOSIS: Stage IV (T1b, N2, M1a) non-small cell lung cancer, adenocarcinoma diagnosed in July 2020. He presented with right lower lobe pulmonary nodule in addition to subcarinal lymphadenopathy as well as bilateral pulmonary nodules.  He was initially treated as a stage IIIa with concurrent chemoradiation but has evidence for disease progression after this course.  Molecular studies by [employer] [id number] NJari [name] Yes 0.4% [id number] (VUS)  No (VUS) 1.7% FGFR1 [id number] None (Synonymous) No (Synonymous)  0.1%  PRIOR THERAPY: A course of concurrent chemoradiation with weekly carboplatin for AUC of 2 and paclitaxel 45 mg/M2.  Status post 7 cycles.  Last dose was given on March 12, 2019.  Unfortunately patient has evidence for disease progression after this treatment.  CURRENT THERAPY: Systemic chemotherapy with carboplatin for AUC of 5, Alimta 500 mg/M2 and Keytruda 200 mg IV every 3 weeks.  First dose May 22, 2019.  INTERVAL HISTORY: Tyler GOELLER58y.o. male returns to the clinic today for follow-up visit accompanied by his brother JCharlotte Cr[place]who [place].  The patient is feeling fine today with no concerning complaints except for fatigue as well as shortness of breath with exertion.  [facility].  [facility]  He declined that option and left the hospice facility.  He is here today for reevaluation and recommendation regarding treatment of his condition.  He denied having any current fever or chills.  He has no nausea, vomiting, diarrhea or  constipation.  He has no headache or visual changes.  He had molecular studies performed by [employer] that showed no actionable mutation.  The patient is interested in proceeding with palliative chemotherapy and immunotherapy if needed.   MEDICAL HISTORY: Past Medical History:  Diagnosis Date   Alcohol abuse    Chronic back pain    COPD (chronic obstructive pulmonary disease) (HCC)    Dyspnea    Lung mass    mediastinal adenopathy   Lung nodule    Multiple pulmonary nodules 04/02/2018   Pneumonia    Right lower lobe pulmonary nodule 04/02/2018   12/01/2017: PET SUV 2.1   Tobacco abuse     ALLERGIES:  has No Known Allergies.  MEDICATIONS:  Current Outpatient Medications  Medication Sig Dispense Refill   albuterol (PROVENTIL HFA;VENTOLIN HFA) 108 (90 Base) MCG/ACT inhaler Inhale 2 puffs into the lungs every 6 (six) hours as needed for wheezing or shortness of breath. 1 Inhaler 3   amoxicillin-clavulanate (AUGMENTIN) 875-125 MG tablet Take 1 tablet by mouth 2 (two) times daily. Discontinue after 05/03/2019 doses.     enoxaparin (LOVENOX) 100 MG/ML injection Inject 0.6 mLs (60 mg total) into the skin daily.     feeding supplement, ENSURE ENLIVE, (ENSURE ENLIVE) LIQD Take 237 mLs by mouth 3 (three) times daily between meals. (Patient taking differently: Take 237 mLs by mouth daily. ) 90 Bottle 0   lidocaine (XYLOCAINE) 2 % solution 5 ml swallow before meals for radiation esophagitis (Patient taking differently: 5 ml swallow before meals for radiation esophagitis as needed to numb) 200 mL 0   metoprolol tartrate (LOPRESSOR) 25 MG tablet Take 1 tablet (  25 mg total) by mouth 2 (two) times daily.     oxyCODONE 10 MG TABS Take 1 tablet (10 mg total) by mouth every 3 (three) hours as needed for moderate pain or severe pain.     polyethylene glycol (MIRALAX / GLYCOLAX) 17 g packet Take 17 g by mouth daily.     potassium chloride SA (K-DUR) 20 MEQ tablet Take 1 tablet (20 mEq  total) by mouth daily. 30 tablet 0   sucralfate (CARAFATE) 1 g tablet Take 1 g by mouth 4 (four) times daily -  with meals and at bedtime.     tiotropium (SPIRIVA HANDIHALER) 18 MCG inhalation capsule Place 1 capsule (18 mcg total) into inhaler and inhale daily. 90 capsule 3   No current facility-administered medications for this visit.     SURGICAL HISTORY:  Past Surgical History:  Procedure Laterality Date   APPENDECTOMY     VIDEO BRONCHOSCOPY WITH ENDOBRONCHIAL NAVIGATION N/A 04/05/2018   Procedure: VIDEO BRONCHOSCOPY WITH ENDOBRONCHIAL NAVIGATION;  Surgeon: Garner Nash, DO;  Location: [place];  Service: Thoracic;  Laterality: N/A;   VIDEO BRONCHOSCOPY WITH ENDOBRONCHIAL NAVIGATION N/A 01/11/2019   Procedure: VIDEO BRONCHOSCOPY WITH ENDOBRONCHIAL NAVIGATION;  Surgeon: Garner Nash, DO;  Location: [place];  Service: Thoracic;  Laterality: N/A;   VIDEO BRONCHOSCOPY WITH ENDOBRONCHIAL ULTRASOUND N/A 01/11/2019   Procedure: VIDEO BRONCHOSCOPY WITH ENDOBRONCHIAL ULTRASOUND;  Surgeon: Garner Nash, DO;  Location: [place];  Service: Thoracic;  Laterality: N/A;   VIDEO BRONCHOSCOPY WITH RADIAL ENDOBRONCHIAL ULTRASOUND N/A 01/11/2019   Procedure: VIDEO BRONCHOSCOPY WITH RADIAL ENDOBRONCHIAL ULTRASOUND;  Surgeon: Garner Nash, DO;  Location: [facility];  Service: Thoracic;  Laterality: N/A;   WISDOM TOOTH EXTRACTION      REVIEW OF SYSTEMS:  Constitutional: positive for fatigue and weight loss Eyes: negative Ears, nose, mouth, throat, and face: negative Respiratory: positive for cough and dyspnea on exertion Cardiovascular: negative Gastrointestinal: negative Genitourinary:negative Integument/breast: negative Hematologic/lymphatic: negative Musculoskeletal:negative Neurological: negative Behavioral/Psych: negative Endocrine: negative Allergic/Immunologic: negative   PHYSICAL EXAMINATION: General appearance: alert, cooperative, fatigued and no distress Head: Normocephalic, without  obvious abnormality, atraumatic Neck: no adenopathy, no JVD, supple, symmetrical, trachea midline and thyroid not enlarged, symmetric, no tenderness/mass/nodules Lymph nodes: Cervical, supraclavicular, and axillary nodes normal. Resp: diminished breath sounds RLL and dullness to percussion RLL Back: symmetric, no curvature. ROM normal. No CVA tenderness. Cardio: regular rate and rhythm, S1, S2 normal, no murmur, click, rub or gallop GI: soft, non-tender; bowel sounds normal; no masses,  no organomegaly Extremities: extremities normal, atraumatic, no cyanosis or edema Neurologic: Alert and oriented X 3, normal strength and tone. Normal symmetric reflexes. Normal coordination and gait  ECOG PERFORMANCE STATUS: 1 - Symptomatic but completely ambulatory  Blood pressure 116/81, pulse (!) 128, temperature 98.3 F (36.8 C), temperature source Temporal, resp. rate 18, height 6' 2"  (1.88 m), weight 124 lb 9.6 oz (56.5 kg), SpO2 100 %.  LABORATORY DATA: Lab Results  Component Value Date   WBC 8.5 05/15/2019   HGB 7.9 (L) 05/15/2019   HCT 26.2 (L) 05/15/2019   MCV 85.6 05/15/2019   PLT 377 05/15/2019      Chemistry      Component Value Date/Time   NA 134 (L) 05/01/2019 0701   K 4.0 05/01/2019 0701   CL 98 05/01/2019 0701   CO2 29 05/01/2019 0701   BUN 6 05/01/2019 0701   CREATININE 0.57 (L) 05/01/2019 0701   CREATININE 0.54 (L) 04/06/2019 1134  Component Value Date/Time   CALCIUM 7.7 (L) 05/01/2019 0701   ALKPHOS 68 04/21/2019 0636   AST 16 04/21/2019 0636   AST 15 04/06/2019 1134   ALT 12 04/21/2019 0636   ALT 6 04/06/2019 1134   BILITOT 0.5 04/21/2019 0636   BILITOT 0.8 04/06/2019 1134       RADIOGRAPHIC STUDIES: Dg Chest 1 View  Result Date: 04/20/2019 CLINICAL DATA:  Post right thoracentesis EXAM: CHEST  1 VIEW COMPARISON:  04/20/2019 FINDINGS: Right pleural effusion markedly improved. Small residual right effusion. Improvement in right lower lobe consolidation. No  pneumothorax Diffuse bilateral airspace disease unchanged. Possible pneumonia or edema. No left effusion. IMPRESSION: No pneumothorax post right thoracentesis. Partial re-expansion right lower lobe Diffuse bilateral airspace disease unchanged compatible with edema or pneumonia. Electronically Signed   By: Franchot Gallo M.D.   On: 04/20/2019 15:49   Dg Chest 2 View  Result Date: 04/24/2019 CLINICAL DATA:  58 year old male with history of lung cancer status post chemotherapy. EXAM: CHEST - 2 VIEW COMPARISON:  Chest x-ray 04/21/2019. FINDINGS: Large right pleural effusion with extensive atelectasis and/or consolidation in the base of the right lung. Small left pleural effusion. Patchy multifocal interstitial opacities widespread throughout the lungs bilaterally, with relative sparing of the apex of the left upper lobe. Pulmonary vasculature does not appearing origin. Heart size is normal. Upper mediastinal contours are within normal limits. IMPRESSION: 1. Patchy multifocal interstitial and airspace disease in the lungs bilaterally, asymmetrically distributed, favored to reflect a multilobar pneumonia. 2. Large right pleural effusion with areas of atelectasis and/or consolidation in the right lung base. Electronically Signed   By: Vinnie Langton M.D.   On: 04/24/2019 17:02   Dg Chest 2 View  Result Date: 04/21/2019 CLINICAL DATA:  Shortness of breath. EXAM: CHEST - 2 VIEW COMPARISON:  April 20, 2019. FINDINGS: Stable cardiomediastinal silhouette. No pneumothorax is noted. Moderate right pleural effusion is noted which may be loculated. Right basilar atelectasis or infiltrate is noted. Stable left perihilar and basilar reticular opacity is noted which may represent edema or possibly scarring. Bony thorax unremarkable. IMPRESSION: Increased right pleural effusion is noted with associated right basilar atelectasis or infiltrate. Stable left midlung and basilar opacity as described above. Electronically Signed    By: Marijo Conception M.D.   On: 04/21/2019 11:32   Dg Chest 2 View  Result Date: 04/20/2019 CLINICAL DATA:  Shortness of breath with productive cough. History of lung cancer. EXAM: CHEST - 2 VIEW COMPARISON:  Radiographs and CT 04/19/2019. FINDINGS: Lateral view was repeated. Right pleural effusion has significantly reaccumulated compared with the prior studies. This is partially loculated inferolaterally. There are worsening airspace opacities at both lung bases which could reflect edema or inflammation. No pneumothorax or significant left pleural effusion. The visualized heart size and mediastinal contours are stable. No acute osseous findings. IMPRESSION: 1. Enlarging right pleural effusion with worsening airspace opacities at both lung bases which could reflect edema or inflammation. 2. No other significant changes identified compared with prior studies. Electronically Signed   By: Richardean Sale M.D.   On: 04/20/2019 11:24   Ct Head Wo Contrast  Result Date: 04/19/2019 CLINICAL DATA:  Altered mental status EXAM: CT HEAD WITHOUT CONTRAST TECHNIQUE: Contiguous axial images were obtained from the base of the skull through the vertex without intravenous contrast. COMPARISON:  MR brain, 01/18/2019 FINDINGS: Brain: No evidence of acute infarction, hemorrhage, hydrocephalus, extra-axial collection or mass lesion/mass effect. The ventricular white matter hypodensity. Vascular: No hyperdense vessel  or unexpected calcification. Skull: Normal. Negative for fracture or focal lesion. Sinuses/Orbits: No acute finding. Other: None. IMPRESSION: No acute intracranial pathology.  Small-vessel white matter disease. Electronically Signed   By: Eddie Candle M.D.   On: 04/19/2019 15:02   Ct Chest Wo Contrast  Result Date: 04/29/2019 CLINICAL DATA:  Evaluate pleural effusion. History of lung cancer and previous right-sided chest tube placement. EXAM: CT CHEST WITHOUT CONTRAST TECHNIQUE: Multidetector CT imaging of the  chest was performed following the standard protocol without IV contrast. COMPARISON:  Chest CT-04/19/2019; 04/06/2019 FINDINGS: Cardiovascular: Borderline cardiomegaly. Coronary artery calcifications. No pericardial effusion. No evidence of thoracic aortic aneurysm. Atherosclerotic plaque involving the origin of the branch vessels of the aortic arch. No intramural hematoma. Mediastinum/Nodes: Evaluation for mediastinal and hilar adenopathy is degraded secondary lack of intravenous contrast. Lungs/Pleura: Stable positioning of right-sided chest tube with significant reduction of previously noted partially loculated right-sided pleural effusion. A small amount of fluid remains within the right major fissure. A small pneumothorax is seen about the anterior basilar aspect of the right hemithorax (image 33, series 7, potentially ex vacuo in etiology. Improved aeration of the right upper lobe however there are persistent air bronchograms about the right hilum and occlusion the right lower lobe bronchus with associated persistent near complete atelectasis/collapse of the right lower lobe. Unchanged approximately 0.8 cm right upper lobe nodule, similar to the 03/2019 examination and potentially representative of a intraparenchymal metastasis. Interval development of a trace left-sided pleural effusion with worsening interstitial opacities within the left lung and consolidative opacities associated air bronchograms within the left lower lobe. Upper Abdomen: Limited noncontrast the superior aspect of the right-sided nephrolithiasis which evaluation of the upper abdomen demonstrates measures at least 0.4 cm in diameter (image 167, series 2). Musculoskeletal: No acute or aggressive osseous abnormalities. Regional soft tissues appear normal. Normal appearance of the thyroid gland. IMPRESSION: 1. Interval reduction in size loculated right-sided pleural effusion post chest tube placement with improved aeration of the right upper  lobe and tiny right-sided pneumothorax, potentially ex vacuo in etiology. A small amount of fluid remains within the right major fissure. 2. Redemonstrated occlusion of the right lower lobe bronchus with associated near complete atelectasis/collapse of the right lower lobe likely attributable to provided history of lung cancer though a definitive parenchymal masses difficult to define secondary to consolidation and volume loss. 3. Interval development of a small left-sided effusion with worsening left basilar consolidative opacities, air bronchograms and interstitial thickening within the left lung, potentially representative asymmetric pulmonary edema though underlying infection is not excluded on the basis of this examination. Clinical correlation is advised. 4. Cardiomegaly. 5. Coronary calcifications.  Aortic Atherosclerosis (ICD10-I70.0). Electronically Signed   By: Sandi Mariscal M.D.   On: 04/29/2019 09:42   Ct Angio Chest Pe W Or Wo Contrast  Result Date: 04/19/2019 CLINICAL DATA:  Chest pain and respiratory distress. History of lung cancer. Discharge home was a long emergency room today. Diagnosed with pulmonary embolism last week. EXAM: CT ANGIOGRAPHY CHEST WITH CONTRAST TECHNIQUE: Multidetector CT imaging of the chest was performed using the standard protocol during bolus administration of intravenous contrast. Multiplanar CT image reconstructions and MIPs were obtained to evaluate the vascular anatomy. CONTRAST:  198m OMNIPAQUE IOHEXOL 350 MG/ML SOLN COMPARISON:  04/06/2019 and 12/11/2018 FINDINGS: Cardiovascular: Mild stable cardiomegaly. Thoracic aorta is normal in caliber. Pulmonary arterial system is well opacified as the previously seen small proximal left-sided pulmonary emboli are no longer visualized. No current pulmonary emboli are visualized bilaterally.  Remaining vascular structures are unremarkable. Mediastinum/Nodes: No definite adenopathy visualized. Remaining mediastinal structures are  unremarkable. Lungs/Pleura: Lungs are adequately inflated demonstrate worsening of a moderate size right pleural effusion. Moderate consolidation over the right lower lobe slightly worse likely combination of atelectasis as well as patient's known right lung cancer. This is difficult to measure due to the adjacent consolidation and effusion. Stable mass over the anteromedial right midlung. Several small stable nodule opacities over the right upper lobe. Patchy mixed interstitial airspace density over the anterior right midlung. Worsening patchy airspace density over the superior segment of the left lower lobe which may be due to infection. Remainder of the left lung is unchanged. Biapical pleural thickening. Obstruction of several proximal right middle lobe and lower lobe bronchi which is a new finding and most likely due to aspiration or mucous plugging. Narrowing of the posterior right pulmonary vein in the region of patient's medial right infrahilar lung cancer. Mild emphysematous disease. Suggestion of aspirate material within the right-sided the distal trachea. Upper Abdomen: No acute findings. Musculoskeletal: No change. Review of the MIP images confirms the above findings. IMPRESSION: 1. Stable changes of patient's known right lower lobe lung cancer which is difficult to define due to the adjacent consolidation and effusion. Stable masslike consolidation over the medial aspect of the anterior right midlung abutting the pericardium. Stable right upper lobe nodules suggesting metastatic disease. Worsening moderate size right pleural effusion with slight worsening consolidation in the right base. New obstruction of right middle lobe and lower lobe bronchi likely due to aspiration or mucous plugging. Narrowing of right posterior pulmonary vein. 2. Mild worsening patchy airspace process over the superior segment left lower lobe as well as anterior right midlung which may be due to infection. 3. Previously seen  small left-sided pulmonary emboli no longer visualized. No current pulmonary emboli identified. 4.  Mild emphysematous disease.  Mild cardiomegaly. Electronically Signed   By: Marin Olp M.D.   On: 04/19/2019 15:25   Dg Chest Port 1 View  Result Date: 04/28/2019 CLINICAL DATA:  Right-sided pleural effusion with right-sided chest tube. History of lung cancer. EXAM: PORTABLE CHEST 1 VIEW COMPARISON:  04/27/2019; 04/26/2019; chest CT-04/19/2019 FINDINGS: Grossly unchanged cardiac silhouette and mediastinal contours. Stable positioning of support apparatus with no change to slight increase in size of small potentially partially loculated right-sided pleural effusion. Slight worsening of bibasilar heterogeneous opacities. Ill-defined heterogeneous opacities are again seen with the peripheral aspect of the left mid lung. No pneumothorax. No definite evidence of edema. No acute osseous abnormalities. IMPRESSION: 1.  Stable positioning of support apparatus.  No pneumothorax. 2. Suspected slight increase in size of small right-sided pleural effusion and worsening bibasilar opacities, atelectasis versus infiltrate. Electronically Signed   By: Sandi Mariscal M.D.   On: 04/28/2019 05:59   Dg Chest Port 1 View  Result Date: 04/27/2019 CLINICAL DATA:  Acute respiratory failure. EXAM: PORTABLE CHEST 1 VIEW COMPARISON:  04/26/2019 FINDINGS: Stable position of the right basilar chest tube. Improved aeration at the right lung base suggesting decreased right pleural effusion. Persistent linear and patchy densities at the right lung base. No evidence for pneumothorax. Hazy interstitial densities in the left lung are unchanged. Heart size is within normal limits and stable. The trachea is midline. IMPRESSION: 1. Improved aeration of the right lung base compatible with decreased right pleural fluid. Right chest tube is in a stable position. 2. Negative for pneumothorax. 3. Linear and nodular right basilar densities are  nonspecific. Electronically Signed  By: Markus Daft M.D.   On: 04/27/2019 09:29   Dg Chest Port 1 View  Result Date: 04/26/2019 CLINICAL DATA:  Pleural effusion EXAM: PORTABLE CHEST 1 VIEW COMPARISON:  04/24/2019 FINDINGS: Right basilar pleural chest tube in place with decreasing right effusion. Moderate right effusion remains. SPECT small right apical pneumothorax. Right base atelectasis or infiltrate. Diffuse interstitial prominence throughout the lungs. Heart is normal size. IMPRESSION: Decreasing right effusion following chest tube placement. Suspect small right apical pneumothorax with continued moderate right pleural effusion. Right basilar atelectasis or infiltrate. Stable diffuse interstitial prominence throughout the lungs. Electronically Signed   By: Rolm Baptise M.D.   On: 04/26/2019 09:51   Dg Chest Port 1 View  Result Date: 04/19/2019 CLINICAL DATA:  Right chest wall pain.  History of lung cancer EXAM: PORTABLE CHEST 1 VIEW COMPARISON:  04/07/2019 FINDINGS: Right base airspace disease and small pleural effusion with increased pleural fluid from comparison chest x-ray. No pneumothorax. Normal heart size. IMPRESSION: 1. Small right pleural effusion with reaccumulation since 04/07/2019. 2. Right base consolidation/mass, reference 04/06/2019 CT. Electronically Signed   By: Monte Fantasia M.D.   On: 04/19/2019 04:07   Ct Perc Pleural Drain W/indwell Cath W/img Guide  Result Date: 04/25/2019 CLINICAL DATA:  Lung carcinoma with loculated right pleural effusion, shortness of breath. EXAM: CT GUIDED CHEST DRAIN PLACEMENT ANESTHESIA/SEDATION: Intravenous Fentanyl 21mg and Versed 149mwere administered as conscious sedation during continuous monitoring of the patient's level of consciousness and physiological / cardiorespiratory status by the radiology RN, with a total moderate sedation time of 26 minutes. PROCEDURE: The procedure, risks, benefits, and alternatives were explained to the patient.  Questions regarding the procedure were encouraged and answered. The patient understands and consents to the procedure. Patient placed in left lateral decubitus position. Select axial scans through the thorax performed in the loculated right pleural effusion was localized. An appropriate skin entry site was determined and marked. The operative field was prepped with chlorhexidinein a sterile fashion, and a sterile drape was applied covering the operative field. A sterile gown and sterile gloves were used for the procedure. Local anesthesia was provided with 1% Lidocaine. Percutaneous entry needle advanced into the right pleural space. Fluid easily aspirated. Amplatz wire advanced easily, its position confirmed on CT fluoroscopy. Tract dilated to facilitate placement of a 14 French pigtail drain catheter, placed within the posterior dependent aspect of the collection. Catheter position confirmed on CT. Catheter was secured externally with 0 Prolene suture and StatLock and placed SaVictoriaThe patient tolerated the procedure well. COMPLICATIONS: None immediate FINDINGS: Loculated right pleural effusion was localized. 14 French pigtail drain catheter placed as above. IMPRESSION: 1. Technically successful CT-guided placement of right chest drain catheter. Electronically Signed   By: D Lucrezia Europe.D.   On: 04/25/2019 15:23   UsKoreahoracentesis Asp Pleural Space W/img Guide  Result Date: 04/20/2019 INDICATION: Patient with history of lung cancer, dyspnea, recurrent right pleural effusion. Request made for diagnostic and therapeutic right thoracentesis. EXAM: ULTRASOUND GUIDED DIAGNOSTIC AND THERAPEUTIC RIGHT THORACENTESIS MEDICATIONS: None COMPLICATIONS: None immediate. PROCEDURE: An ultrasound guided thoracentesis was thoroughly discussed with the patient and questions answered. The benefits, risks, alternatives and complications were also discussed. The patient understands and wishes to proceed with the  procedure. Written consent was obtained. Ultrasound was performed to localize and mark an adequate pocket of fluid in the right chest. The area was then prepped and draped in the normal sterile fashion. 1% Lidocaine was used for local anesthesia. Under  ultrasound guidance a 6 Fr Safe-T-Centesis catheter was introduced. Thoracentesis was performed. The catheter was removed and a dressing applied. FINDINGS: A total of approximately 1.2 liters of hazy, yellow fluid was removed. Samples were sent to the laboratory as requested by the clinical team. The pleural fluid collection was multiloculated. IMPRESSION: Successful ultrasound guided diagnostic and therapeutic right thoracentesis yielding 1.2 liters of pleural fluid. Read by: Rowe Robert, PA-C Electronically Signed   By: Jacqulynn Cadet M.D.   On: 04/20/2019 15:26    ASSESSMENT AND PLAN: This is a very pleasant 58 years old African-American male recently diagnosed with a stage IIIA non-small cell lung cancer, adenocarcinoma.  He completed a course of concurrent chemoradiation with weekly carboplatin and paclitaxel status post 7 cycles.   The patient tolerated this treatment well except for fatigue and odynophagia. Imaging studies after the course of concurrent chemoradiation unfortunately showed significant increase in the tumor burden involving the left lower lobe and also now right lower lobe. There was overlying groundglass attenuation and interlobular septal thickening concerning for lymphangitic spread of tumor.  The patient also has new bilateral pleural effusions right greater than left with additional small solid and sub-solid nodules involving the right upper lobe and left lung that were not significantly changed in the interval.  The scan also showed the left upper lobe and left lower lobe segmental branch acute pulmonary emboli. I had a lengthy discussion with the patient and his brother today about his current condition and treatment  options. The patient understands that he has incurable condition and all the treatment will be of palliative nature.  He was giving the option of palliative care and hospice referral again versus consideration of palliative systemic chemotherapy with carboplatin for AUC of 5, Alimta 500 mg/M2 and Keytruda 200 mg IV every 3 weeks.  I discussed with the patient the adverse effect of the chemotherapy including but not limited to alopecia, myelosuppression, nausea and vomiting, peripheral neuropathy, liver or renal dysfunction as well as the immunotherapy adverse effects. The patient is interested in proceeding with palliative chemotherapy and immunotherapy. He is expected to start the first cycle of this treatment next week. He will receive vitamin B12 injection today. I will call his pharmacy with prescription for Compazine 10 mg p.o. every 6 hours as needed for nausea in addition to folic acid 1 mg p.o. daily. For the hypomagnesemia, I will start the patient on magnesium oxide 400 mg p.o. twice daily. The patient will come back for follow-up visit in 2 weeks for evaluation and management of any adverse effect of his treatment. He was advised to call immediately if he has any concerning symptoms in the interval. For the recent pulmonary embolism, the patient will continue his current treatment with Lovenox. The patient voices understanding of current disease status and treatment options and is in agreement with the current care plan.  All questions were answered. The patient knows to call the clinic with any problems, questions or concerns. We can certainly see the patient much sooner if necessary.  Disclaimer: This note was dictated with voice recognition software. Similar sounding words can inadvertently be transcribed and may not be corrected upon review.

## 2019-05-15 NOTE — Telephone Encounter (Signed)
CSW received request from medical oncology RN, patient would like to speak to CSW about his disability paperwork again.  CSW unable to see patient in office, attempted to call patient.  Left voicemail requesting return call.  Maryjean Morn, MSW, LCSW, OSW-C Clinical Social Worker [facility] [phone]

## 2019-05-16 DIAGNOSIS — C3491 Malignant neoplasm of unspecified part of right bronchus or lung: Secondary | ICD-10-CM | POA: Diagnosis not present

## 2019-05-17 ENCOUNTER — Encounter: Payer: Self-pay | Admitting: Pharmacy Technician

## 2019-05-17 DIAGNOSIS — C3491 Malignant neoplasm of unspecified part of right bronchus or lung: Secondary | ICD-10-CM | POA: Diagnosis not present

## 2019-05-18 DIAGNOSIS — C3491 Malignant neoplasm of unspecified part of right bronchus or lung: Secondary | ICD-10-CM | POA: Diagnosis not present

## 2019-05-19 DIAGNOSIS — C3491 Malignant neoplasm of unspecified part of right bronchus or lung: Secondary | ICD-10-CM | POA: Diagnosis not present

## 2019-05-20 DIAGNOSIS — C3491 Malignant neoplasm of unspecified part of right bronchus or lung: Secondary | ICD-10-CM | POA: Diagnosis not present

## 2019-05-21 DIAGNOSIS — C3491 Malignant neoplasm of unspecified part of right bronchus or lung: Secondary | ICD-10-CM | POA: Diagnosis not present

## 2019-05-22 ENCOUNTER — Encounter: Payer: Self-pay | Admitting: Internal Medicine

## 2019-05-22 ENCOUNTER — Inpatient Hospital Stay: Payer: Medicaid Other

## 2019-05-22 ENCOUNTER — Other Ambulatory Visit: Payer: Self-pay | Admitting: Internal Medicine

## 2019-05-22 ENCOUNTER — Telehealth: Payer: Self-pay | Admitting: Medical Oncology

## 2019-05-22 ENCOUNTER — Other Ambulatory Visit: Payer: Self-pay

## 2019-05-22 VITALS — BP 102/87 | HR 97 | Temp 98.2°F | Resp 16

## 2019-05-22 DIAGNOSIS — C3491 Malignant neoplasm of unspecified part of right bronchus or lung: Secondary | ICD-10-CM | POA: Diagnosis not present

## 2019-05-22 DIAGNOSIS — C3431 Malignant neoplasm of lower lobe, right bronchus or lung: Secondary | ICD-10-CM | POA: Diagnosis not present

## 2019-05-22 LAB — CBC WITH DIFFERENTIAL (CANCER CENTER ONLY)
Abs Immature Granulocytes: 0.03 10*3/uL (ref 0.00–0.07)
Basophils Absolute: 0 10*3/uL (ref 0.0–0.1)
Basophils Relative: 0 %
Eosinophils Absolute: 0.1 10*3/uL (ref 0.0–0.5)
Eosinophils Relative: 2 %
HCT: 27.4 % — ABNORMAL LOW (ref 39.0–52.0)
Hemoglobin: 8.4 g/dL — ABNORMAL LOW (ref 13.0–17.0)
Immature Granulocytes: 0 %
Lymphocytes Relative: 26 %
Lymphs Abs: 1.8 10*3/uL (ref 0.7–4.0)
MCH: 26.1 pg (ref 26.0–34.0)
MCHC: 30.7 g/dL (ref 30.0–36.0)
MCV: 85.1 fL (ref 80.0–100.0)
Monocytes Absolute: 1 10*3/uL (ref 0.1–1.0)
Monocytes Relative: 14 %
Neutro Abs: 3.9 10*3/uL (ref 1.7–7.7)
Neutrophils Relative %: 58 %
Platelet Count: 300 10*3/uL (ref 150–400)
RBC: 3.22 MIL/uL — ABNORMAL LOW (ref 4.22–5.81)
RDW: 18.6 % — ABNORMAL HIGH (ref 11.5–15.5)
WBC Count: 6.8 10*3/uL (ref 4.0–10.5)
nRBC: 0.3 % — ABNORMAL HIGH (ref 0.0–0.2)

## 2019-05-22 LAB — CMP (CANCER CENTER ONLY)
ALT: 11 U/L (ref 0–44)
AST: 19 U/L (ref 15–41)
Albumin: 2.3 g/dL — ABNORMAL LOW (ref 3.5–5.0)
Alkaline Phosphatase: 121 U/L (ref 38–126)
Anion gap: 11 (ref 5–15)
BUN: 6 mg/dL (ref 6–20)
CO2: 26 mmol/L (ref 22–32)
Calcium: 8.3 mg/dL — ABNORMAL LOW (ref 8.9–10.3)
Chloride: 100 mmol/L (ref 98–111)
Creatinine: 0.56 mg/dL — ABNORMAL LOW (ref 0.61–1.24)
GFR, Est AFR Am: >60 mL/min (ref >60)
GFR, Estimated: >60 mL/min (ref >60)
Glucose, Bld: 88 mg/dL (ref 70–99)
Potassium: 3.6 mmol/L (ref 3.5–5.1)
Sodium: 137 mmol/L (ref 135–145)
Total Bilirubin: 0.4 mg/dL (ref 0.3–1.2)
Total Protein: 7.9 g/dL (ref 6.5–8.1)

## 2019-05-22 LAB — TSH: TSH: 2.002 u[IU]/mL (ref 0.320–4.118)

## 2019-05-22 MED ORDER — SODIUM CHLORIDE 0.9 % IV SOLN
517.0000 mg/m2 | Freq: Once | INTRAVENOUS | Status: AC
  Administered 2019-05-22: 16:00:00 900 mg via INTRAVENOUS
  Filled 2019-05-22: qty 20

## 2019-05-22 MED ORDER — SODIUM CHLORIDE 0.9 % IV SOLN
Freq: Once | INTRAVENOUS | Status: AC
  Administered 2019-05-22: 15:00:00 via INTRAVENOUS
  Filled 2019-05-22: qty 5

## 2019-05-22 MED ORDER — PALONOSETRON HCL INJECTION 0.25 MG/5ML
INTRAVENOUS | Status: AC
  Filled 2019-05-22: qty 5

## 2019-05-22 MED ORDER — SODIUM CHLORIDE 0.9 % IV SOLN
Freq: Once | INTRAVENOUS | Status: AC
  Administered 2019-05-22: 14:00:00 via INTRAVENOUS
  Filled 2019-05-22: qty 250

## 2019-05-22 MED ORDER — PALONOSETRON HCL INJECTION 0.25 MG/5ML
0.2500 mg | Freq: Once | INTRAVENOUS | Status: AC
  Administered 2019-05-22: 15:00:00 0.25 mg via INTRAVENOUS

## 2019-05-22 MED ORDER — SODIUM CHLORIDE 0.9 % IV SOLN
200.0000 mg | Freq: Once | INTRAVENOUS | Status: AC
  Administered 2019-05-22: 200 mg via INTRAVENOUS
  Filled 2019-05-22: qty 8

## 2019-05-22 MED ORDER — SODIUM CHLORIDE 0.9 % IV SOLN
517.5000 mg | Freq: Once | INTRAVENOUS | Status: AC
  Administered 2019-05-22: 520 mg via INTRAVENOUS
  Filled 2019-05-22: qty 52

## 2019-05-22 MED ORDER — RIVAROXABAN (XARELTO) VTE STARTER PACK (15 & 20 MG): ORAL_TABLET | ORAL | 0 refills | Status: DC

## 2019-05-22 NOTE — Patient Instructions (Signed)
[facility] Discharge Instructions for Patients Receiving Chemotherapy  Today you received the following chemotherapy agents carboplatin/alimta/keytruda   To help prevent nausea and vomiting after your treatment, we encourage you to take your nausea medication as directed.  If you develop nausea and vomiting that is not controlled by your nausea medication, call the clinic.   BELOW ARE SYMPTOMS THAT SHOULD BE REPORTED IMMEDIATELY:  *FEVER GREATER THAN 100.5 F  *CHILLS WITH OR WITHOUT FEVER  NAUSEA AND VOMITING THAT IS NOT CONTROLLED WITH YOUR NAUSEA MEDICATION  *UNUSUAL SHORTNESS OF BREATH  *UNUSUAL BRUISING OR BLEEDING  TENDERNESS IN MOUTH AND THROAT WITH OR WITHOUT PRESENCE OF ULCERS  *URINARY PROBLEMS  *BOWEL PROBLEMS  UNUSUAL RASH Items with * indicate a potential emergency and should be followed up as soon as possible.  Feel free to call the clinic you have any questions or concerns. The clinic phone number is [phone].

## 2019-05-22 NOTE — Telephone Encounter (Signed)
-----   Message from [name], MD sent at 05/22/2019  3:25 PM EST ----- Please let him know that I sent prescription of Xarelto to his pharmacy.  He was supposed to be on Lovenox but he is not taking it.  Thank you. ----- Message ----- From: Buel Ream, [facility] [place] Sent: 05/22/2019   1:28 PM EST To: [name], MD

## 2019-05-22 NOTE — Progress Notes (Signed)
Received message that patient is a self-pay and needs assistance with Xarelto rx.  Went to treatment area to introduce myself as Arboriculturist and to offer available resources.  Discussed one-time $50 [place] he may apply for by providing proof of household income or letter of support. Patient declined and states he has Medicare.

## 2019-05-22 NOTE — Progress Notes (Signed)
New Rx for xarelto sent to pt's pharmacy.  Pt informed.  Tyler [name] notified pt may need assistance.

## 2019-05-23 ENCOUNTER — Telehealth: Payer: Self-pay | Admitting: *Deleted

## 2019-05-23 DIAGNOSIS — C3491 Malignant neoplasm of unspecified part of right bronchus or lung: Secondary | ICD-10-CM | POA: Diagnosis not present

## 2019-05-24 DIAGNOSIS — C3491 Malignant neoplasm of unspecified part of right bronchus or lung: Secondary | ICD-10-CM | POA: Diagnosis not present

## 2019-05-25 ENCOUNTER — Telehealth: Payer: Self-pay | Admitting: *Deleted

## 2019-05-25 DIAGNOSIS — C3491 Malignant neoplasm of unspecified part of right bronchus or lung: Secondary | ICD-10-CM | POA: Diagnosis not present

## 2019-05-25 NOTE — Telephone Encounter (Signed)
Jene Every, RN @ [facility] called and left message re:  Pt was referred to Hospice on 10/28.  However, pt has started chemo treatment ; therefore, pt is not eligible for Hospice service.  However, Santiago Glad would like to talk to nurse about referring pt to Palliative Care while pt is under active treatment.  Pt will be able to have Hospice services again if and when pt needs this in the future. Attempted to call Santiago Glad back without answer.  Left message on voice mail requesting a call back. Karen[facility]   Phone    [phone].

## 2019-05-29 ENCOUNTER — Other Ambulatory Visit: Payer: Self-pay

## 2019-05-29 ENCOUNTER — Inpatient Hospital Stay: Payer: Medicaid Other | Admitting: *Deleted

## 2019-05-29 ENCOUNTER — Inpatient Hospital Stay: Payer: Medicaid Other

## 2019-05-29 ENCOUNTER — Encounter: Payer: Self-pay | Admitting: Physician Assistant

## 2019-05-29 ENCOUNTER — Inpatient Hospital Stay (HOSPITAL_BASED_OUTPATIENT_CLINIC_OR_DEPARTMENT_OTHER): Payer: Medicaid Other | Admitting: Physician Assistant

## 2019-05-29 VITALS — BP 100/64 | HR 74 | Temp 97.8°F | Resp 15 | Ht 74.0 in | Wt 118.8 lb

## 2019-05-29 DIAGNOSIS — C3491 Malignant neoplasm of unspecified part of right bronchus or lung: Secondary | ICD-10-CM

## 2019-05-29 DIAGNOSIS — E860 Dehydration: Secondary | ICD-10-CM

## 2019-05-29 DIAGNOSIS — C3431 Malignant neoplasm of lower lobe, right bronchus or lung: Secondary | ICD-10-CM | POA: Diagnosis not present

## 2019-05-29 LAB — CMP (CANCER CENTER ONLY)
ALT: 20 U/L (ref 0–44)
AST: 24 U/L (ref 15–41)
Albumin: 3 g/dL — ABNORMAL LOW (ref 3.5–5.0)
Alkaline Phosphatase: 117 U/L (ref 38–126)
Anion gap: 16 — ABNORMAL HIGH (ref 5–15)
BUN: 9 mg/dL (ref 6–20)
CO2: 29 mmol/L (ref 22–32)
Calcium: 8.7 mg/dL — ABNORMAL LOW (ref 8.9–10.3)
Chloride: 92 mmol/L — ABNORMAL LOW (ref 98–111)
Creatinine: 0.64 mg/dL (ref 0.61–1.24)
GFR, Est AFR Am: >60 mL/min (ref >60)
GFR, Estimated: >60 mL/min (ref >60)
Glucose, Bld: 90 mg/dL (ref 70–99)
Potassium: 3.4 mmol/L — ABNORMAL LOW (ref 3.5–5.1)
Sodium: 137 mmol/L (ref 135–145)
Total Bilirubin: 0.4 mg/dL (ref 0.3–1.2)
Total Protein: 8.4 g/dL — ABNORMAL HIGH (ref 6.5–8.1)

## 2019-05-29 LAB — CBC WITH DIFFERENTIAL (CANCER CENTER ONLY)
Abs Immature Granulocytes: 0.02 10*3/uL (ref 0.00–0.07)
Basophils Absolute: 0 10*3/uL (ref 0.0–0.1)
Basophils Relative: 0 %
Eosinophils Absolute: 0 10*3/uL (ref 0.0–0.5)
Eosinophils Relative: 0 %
HCT: 30.1 % — ABNORMAL LOW (ref 39.0–52.0)
Hemoglobin: 9.6 g/dL — ABNORMAL LOW (ref 13.0–17.0)
Immature Granulocytes: 1 %
Lymphocytes Relative: 34 %
Lymphs Abs: 1.3 10*3/uL (ref 0.7–4.0)
MCH: 26.1 pg (ref 26.0–34.0)
MCHC: 31.9 g/dL (ref 30.0–36.0)
MCV: 81.8 fL (ref 80.0–100.0)
Monocytes Absolute: 0.3 10*3/uL (ref 0.1–1.0)
Monocytes Relative: 8 %
Neutro Abs: 2.3 10*3/uL (ref 1.7–7.7)
Neutrophils Relative %: 57 %
Platelet Count: 213 10*3/uL (ref 150–400)
RBC: 3.68 MIL/uL — ABNORMAL LOW (ref 4.22–5.81)
RDW: 17.8 % — ABNORMAL HIGH (ref 11.5–15.5)
WBC Count: 3.9 10*3/uL — ABNORMAL LOW (ref 4.0–10.5)
nRBC: 0 % (ref 0.0–0.2)

## 2019-05-29 MED ORDER — SODIUM CHLORIDE 0.9 % IV SOLN
Freq: Once | INTRAVENOUS | Status: AC
  Administered 2019-05-29: 12:00:00 via INTRAVENOUS
  Filled 2019-05-29: qty 250

## 2019-05-29 NOTE — Patient Instructions (Signed)

## 2019-05-29 NOTE — Progress Notes (Signed)
[place] Work  Clinical Social Work met with patient in infusion room today.  Mr. Sorg has been in process of applying for social security disability and is having difficulty getting claim status.  Patient provided CSW with letter he recently received from [place].  The letter states patient has been approved for disability, but patient needs to call SSI to provide more information.  CSW and patient attempted to contact SSI at that time, had to leave voicemail.  CSW provided instructions for patient to discuss with caseworker when he receives phone call.  CSW discussed case with medical oncology PA.  PA requested any resources that may be available to assist patient with daily living needs at home.  CSW and PA completed Medicaid personal care services form and faxed to [facility] healthcare.  Gwinda Maine, LCSW  Clinical Social Worker [facility]

## 2019-05-29 NOTE — Progress Notes (Signed)
[facility] OFFICE PROGRESS NOTE  Brand Males, MD [place] [id number]  DIAGNOSIS: Stage IV (T1b, N2, M1a) non-small cell lung cancer, adenocarcinoma diagnosed in July 2020.He presented with right lower lobe pulmonary nodule in addition to subcarinal lymphadenopathy as well as bilateral pulmonary nodules.  He was initially treated as a stage IIIa with concurrent chemoradiation but has evidence for disease progression after this course.  Molecular studies by [employer] 360 [id number] NJari PiggTalazoparib Yes 0.4% [id number] (VUS)    No (VUS) 1.7% FGFR1 [id number] None (Synonymous) No (Synonymous)      0.1%  PRIOR THERAPY: A course of concurrent chemoradiation with weekly carboplatin for AUC of 2 and paclitaxel 45 mg/M2.  Status post 7 cycles.  Last dose was given on March 12, 2019.  Unfortunately patient has evidence for disease progression after this treatment.  CURRENT THERAPY: Systemic chemotherapy with carboplatin for AUC of 5, Alimta 500 mg/M2 and Keytruda 200 mg IV every 3 weeks.  First dose May 22, 2019. Status post 1 cycle.   INTERVAL HISTORY: Tyler STALLING58y.o. male returns to the clinic for a follow up appointment today. The patient unfortunately had evidence of disease progression after completing his course of weekly concurrent chemoradiation.  He was subsequently started on systemic chemotherapy with carboplatin for an AUC of 5, Alimta 500 mg/m and Keytruda 200 mg IV every 3 weeks.  He completed his first cycle of treatment last week and he is here today for a 1 week follow-up visit.  The patient experienced significant fatigue and weakness following his first cycle of treatment.  The patient states that he has not been drinking a lot of fluids and has been mostly just drinking Ensure.  He denies any fever or night sweats.  He lost an additional 6 pounds since his last treatment.  The patient  denies any chills but states that he is "cold" since arriving to the cancer center today.  He reports his shortness of breath with exertion and his productive cough.  He states that he has thick yellow mucus.  He denies any pain.  He denies any nausea, vomiting, diarrhea, or constipation following treatment.  He denies any headache or visual changes.  He denies any rashes or skin changes. He denies any pain at this time.  The patient recently went off hospice treatment. His hospice nurse called and inquired about palliative care. When asked what services or needs he has at home, he mentioned he needs assistance with ADLs. He is here for a 1 week follow up visit following his first cycle of chemotherapy.         MEDICAL HISTORY: Past Medical History:  Diagnosis Date  . Alcohol abuse   . Chronic back pain   . COPD (chronic obstructive pulmonary disease) ([place]   . Dyspnea   . Lung mass    mediastinal adenopathy  . Lung nodule   . Multiple pulmonary nodules 04/02/2018  . Pneumonia   . Right lower lobe pulmonary nodule 04/02/2018   12/01/2017: PET SUV 2.1  . Tobacco abuse     ALLERGIES:  has No Known Allergies.  MEDICATIONS:  Current Outpatient Medications  Medication Sig Dispense Refill  . albuterol (PROVENTIL HFA;VENTOLIN HFA) 108 (90 Base) MCG/ACT inhaler Inhale 2 puffs into the lungs every 6 (six) hours as needed for wheezing or shortness of breath. 1 Inhaler 3  . amoxicillin-clavulanate (AUGMENTIN) 875-125 MG tablet Take 1 tablet by mouth  2 (two) times daily. Discontinue after 05/03/2019 doses.    . feeding supplement, ENSURE ENLIVE, (ENSURE ENLIVE) LIQD Take 237 mLs by mouth 3 (three) times daily between meals. (Patient taking differently: Take 237 mLs by mouth daily. ) 90 Bottle 0  . folic acid (FOLVITE) 1 MG tablet Take 1 tablet (1 mg total) by mouth daily. 30 tablet 4  . lidocaine (XYLOCAINE) 2 % solution 5 ml swallow before meals for radiation esophagitis (Patient taking  differently: 5 ml swallow before meals for radiation esophagitis as needed to numb) 200 mL 0  . magnesium oxide (MAG-OX) 400 (241.3 Mg) MG tablet Take 1 tablet (400 mg total) by mouth 2 (two) times daily. 60 tablet 0  . metoprolol tartrate (LOPRESSOR) 25 MG tablet Take 1 tablet (25 mg total) by mouth 2 (two) times daily.    Marland Kitchen oxyCODONE 10 MG TABS Take 1 tablet (10 mg total) by mouth every 3 (three) hours as needed for moderate pain or severe pain.    . polyethylene glycol (MIRALAX / GLYCOLAX) 17 g packet Take 17 g by mouth daily.    . potassium chloride SA (K-DUR) 20 MEQ tablet Take 1 tablet (20 mEq total) by mouth daily. 30 tablet 0  . prochlorperazine (COMPAZINE) 10 MG tablet Take 1 tablet (10 mg total) by mouth every 6 (six) hours as needed for nausea or vomiting. 30 tablet 0  . Rivaroxaban 15 & 20 MG TBPK Follow package directions: Take one 27m tablet by mouth twice a day. On day 22, switch to one 267mtablet once a day. Take with food. 51 each 0  . sucralfate (CARAFATE) 1 g tablet Take 1 g by mouth 4 (four) times daily -  with meals and at bedtime.    . Marland Kitcheniotropium (SPIRIVA HANDIHALER) 18 MCG inhalation capsule Place 1 capsule (18 mcg total) into inhaler and inhale daily. 90 capsule 3   No current facility-administered medications for this visit.     SURGICAL HISTORY:  Past Surgical History:  Procedure Laterality Date  . APPENDECTOMY    . VIDEO BRONCHOSCOPY WITH ENDOBRONCHIAL NAVIGATION N/A 04/05/2018   Procedure: VIDEO BRONCHOSCOPY WITH ENDOBRONCHIAL NAVIGATION;  Surgeon: IcGarner NashDO;  [facility] Thor[facility];  Laterality: N/A;  . VIDEO BRONCHOSCOPY WITH ENDOBRONCHIAL NAVIGATION N/A 01/11/2019   Procedure: VIDEO BRONCHOSCOPY WITH ENDOBRONCHIAL NAVIGATION;  Surgeon: IcGarner NashDO;  [place] Thor[facility];  Laterality: N/A;  . VIDEO BRONCHOSCOPY WITH ENDOBRONCHIAL ULTRASOUND N/A 01/11/2019   Procedure: VIDEO BRONCHOSCOPY WITH ENDOBRONCHIAL ULTRASOUND;   Surgeon: IcGarner NashDO;  [place];  Laterality: N/A;  . VIDEO BRONCHOSCOPY WITH RADIAL ENDOBRONCHIAL ULTRASOUND N/A 01/11/2019   Procedure: VIDEO BRONCHOSCOPY WITH RADIAL ENDOBRONCHIAL ULTRASOUND;  Surgeon: IcGarner NashDO;  [facility] Thoracic;  Laterality: N/A;  . WISDOM TOOTH EXTRACTION      REVIEW OF SYSTEMS:   Review of Systems  Constitutional: Positive for fatigue, decreased appetite, weight loss, and generalized weakness. Negative for fever.  HENT: Positive for single ulcer on left lateral side of the tongue. Negative for nosebleeds, sore throat and trouble swallowing.   Eyes: Negative for eye problems and icterus.  Respiratory: Positive for dyspnea on exertion and productive cough. Negative for hemoptysis and wheezing.   Cardiovascular: Negative for chest pain and leg swelling.  Gastrointestinal: Negative for abdominal pain, constipation, diarrhea, nausea and vomiting.  Genitourinary: Negative for bladder incontinence, difficulty urinating, dysuria, frequency and hematuria.  Musculoskeletal: Negative for back pain, gait problem, neck pain and neck stiffness.  Skin: Negative for itching and rash.  Neurological: Negative for dizziness, extremity weakness, gait problem, headaches, light-headedness and seizures.  Hematological: Negative for adenopathy. Does not bruise/bleed easily.  Psychiatric/Behavioral: Negative for confusion, depression and sleep disturbance. The patient is not nervous/anxious.     PHYSICAL EXAMINATION:  Blood pressure 100/64, pulse 74, temperature 97.8 F (36.6 C), temperature source Temporal, resp. rate 15, height 6' 2"  (1.88 m), weight 118 lb 12.8 oz (53.9 kg), SpO2 97 %.  ECOG PERFORMANCE STATUS: 2 - Symptomatic, <50% confined to bed  Physical Exam  Constitutional: Oriented to person, place, and time and thin appearing and chronically ill appearing male and in no distress.  HENT:  Head: Normocephalic and  atraumatic.  Mouth/Throat: Single ulcer on left lateral tongue. Oropharynx is clear and moist. No oropharyngeal exudate.  Eyes: Conjunctivae are normal. Right eye exhibits no discharge. Left eye exhibits no discharge. No scleral icterus.  Neck: Normal range of motion. Neck supple.  Cardiovascular: Normal rate, regular rhythm, normal heart sounds and intact distal pulses.   Pulmonary/Chest: Effort normal. Decreased breath sounds in all lung fields. No respiratory distress. No wheezes. No rales.  Abdominal: Soft. Bowel sounds are normal. Exhibits no distension and no mass. There is no tenderness.  Musculoskeletal: Normal range of motion. Exhibits no edema.  Lymphadenopathy:    No cervical adenopathy.  Neurological: Alert and oriented to person, place, and time. Exhibits normal muscle tone. Gait normal. Coordination normal.  Skin: Skin is warm and dry. No rash noted. Not diaphoretic. No erythema. No pallor.  Psychiatric: Mood, memory and judgment normal.  Vitals reviewed.  LABORATORY DATA: Lab Results  Component Value Date   WBC 3.9 (L) 05/29/2019   HGB 9.6 (L) 05/29/2019   HCT 30.1 (L) 05/29/2019   MCV 81.8 05/29/2019   PLT 213 05/29/2019      Chemistry      Component Value Date/Time   NA 137 05/29/2019 1111   K 3.4 (L) 05/29/2019 1111   CL 92 (L) 05/29/2019 1111   CO2 29 05/29/2019 1111   BUN 9 05/29/2019 1111   CREATININE 0.64 05/29/2019 1111      Component Value Date/Time   CALCIUM 8.7 (L) 05/29/2019 1111   ALKPHOS 117 05/29/2019 1111   AST 24 05/29/2019 1111   ALT 20 05/29/2019 1111   BILITOT 0.4 05/29/2019 1111       RADIOGRAPHIC STUDIES:  No results found.   ASSESSMENT/PLAN:  This is a very pleasant 58 year old African-American male initially diagnosed with stage IIIa non-small cell lung cancer, adenocarcinoma in July 2020.  He presented with a right lower lobe pulmonary nodule in addition to subcarinal lymphadenopathy as well as bilateral pulmonary nodules.  He  was initially treated with weekly concurrent chemoradiation with carboplatin for an AUC of 2 and paclitaxel 45 mg/m.  He had evidence of disease progression after this course with significant increase in the tumor burden involving the left lower lobe and also now right lower lobe. There was overlying groundglass attenuation and interlobular septal thickening concerning for lymphangitic spread of tumor.  The patient also has new bilateral pleural effusions right greater than left with additional small solid and sub-solid nodules involving the right upper lobe and left lung that were not significantly changed in the interval.  The scan also showed the left upper lobe and left lower lobe segmental branch acute pulmonary emboli.  He has no actionable mutations.   Patient  recently started palliative systemic chemotherapy with carboplatin for an AUC of 5, Alimta 500 mg/m and Keytruda 200 mg IV every 3 weeks.  He is status post his first cycle.  The patient experience significant fatigue and weakness following his first cycle of chemotherapy.   Patient was seen with Dr. Julien Nordmann today.  Dr. Julien Nordmann discussed the patient's current condition and treatment options including continuing on palliative systemic chemotherapy versus a referral to hospice.  The patient is still interested in pursuing palliative chemotherapy.  Patient's labs were reviewed.  Recommend he proceed with cycle #2 in 2 weeks as scheduled.  We will see the patient back for a follow-up visit in 2 weeks for evaluation before starting cycle #2.  We will arrange for the patient to receive 1 L of normal saline while in the clinic today for dehydration.  The patient was strongly encouraged to increase his oral hydration as well as caloric intake.  The patient is scheduled to meet with social work today to further evaluate his financial and social needs and what assistance he qualifies for. Discussed that palliative care moreso manages side  effects/symptoms compared to providing nursing care/assistance at home. The patient is not having any significant side effects from his treatment at this time besides fatigue and generalized weakness.   He was encouraged to rinse with salt water rinses or Biotene for the sore on his tongue. For his thick mucus, the patient was encouraged to use mucinex.   The patient was advised to call immediately if he has any concerning symptoms in the interval. The patient voices understanding of current disease status and treatment options and is in agreement with the current care plan. All questions were answered. The patient knows to call the clinic with any problems, questions or concerns. We can certainly see the patient much sooner if necessary  No orders of the defined types were placed in this encounter.    Dlisa Barnwell L Netanya Yazdani, PA-C 05/29/19  ADDENDUM: Hematology/Oncology Attending: I had a face-to-face encounter with the patient today.  I recommended his care plan.  This is a very pleasant 58 years old African-American male with metastatic non-small cell lung cancer, adenocarcinoma.  The patient is currently undergoing systemic chemotherapy with carboplatin, Alimta and Keytruda status post 1 cycle.  He tolerated the first week of his treatment fairly well with no concerning adverse effects except for the baseline fatigue and weakness. I recommended for the patient to continue his treatment and he will proceed with cycle #2 in 2 weeks. For the dehydration and lack of appetite, we will give the patient 1 L of normal saline in the clinic today. We will also refer the patient to social worker for assessment with his financial and social needs. He was advised to call immediately if he has any other concerning symptoms in the interval.  Disclaimer: This note was dictated with voice recognition software. Similar sounding words can inadvertently be transcribed and may be missed upon review. Eilleen Kempf, MD 05/29/19

## 2019-05-30 ENCOUNTER — Encounter: Payer: Self-pay | Admitting: Internal Medicine

## 2019-05-30 NOTE — Progress Notes (Signed)
  Radiation Oncology         [phone] ________________________________  Name: Tyler [name] MRN: [id number]  Date: 03/14/2019  DOB: 14-May-1961  End of Treatment Note  Diagnosis:  Lung cancer     Indication for treatment::  curative       Radiation treatment dates:   01/29/19 - 03/14/19  Site/dose:   The patient was treated to the disease within the right lung initially to a dose of 60 Gy using a 5 field, 3-D conformal technique. The patient then received a cone down boost treatment for an additional 6 Gy. This yielded a final total dose of 66 Gy.   Narrative: The patient tolerated radiation treatment relatively well.   The patient did experience esophagitis during the course of treatment which required management.   Plan: The patient has completed radiation treatment. The patient will return to radiation oncology clinic for routine followup in one month. I advised the patient to call or return sooner if they have any questions or concerns related to their recovery or treatment. ________________________________  Jodelle Gross, M.D., Ph.D.

## 2019-05-30 NOTE — Progress Notes (Signed)
Patient called to reconsider applying for one-time $700 [place].  Advised patient letter of support from whom he lives with is needed stating they are supporting him financially. Advised to bring on 06/05/19 at [place] before lab appointment. He verbalized understanding.  He has my card for any additional financial questions or concerns.

## 2019-06-05 ENCOUNTER — Encounter: Payer: Self-pay | Admitting: Internal Medicine

## 2019-06-05 ENCOUNTER — Inpatient Hospital Stay: Payer: Medicaid Other | Attending: Internal Medicine

## 2019-06-05 ENCOUNTER — Telehealth: Payer: Self-pay | Admitting: Medical Oncology

## 2019-06-05 ENCOUNTER — Inpatient Hospital Stay: Payer: Medicaid Other | Admitting: *Deleted

## 2019-06-05 ENCOUNTER — Other Ambulatory Visit: Payer: Self-pay

## 2019-06-05 ENCOUNTER — Ambulatory Visit: Payer: Medicaid Other

## 2019-06-05 ENCOUNTER — Other Ambulatory Visit: Payer: Self-pay | Admitting: Medical Oncology

## 2019-06-05 ENCOUNTER — Inpatient Hospital Stay: Payer: Medicaid Other

## 2019-06-05 VITALS — BP 122/76 | HR 106 | Temp 98.3°F | Resp 16

## 2019-06-05 DIAGNOSIS — C3491 Malignant neoplasm of unspecified part of right bronchus or lung: Secondary | ICD-10-CM

## 2019-06-05 DIAGNOSIS — E860 Dehydration: Secondary | ICD-10-CM | POA: Insufficient documentation

## 2019-06-05 DIAGNOSIS — Z9221 Personal history of antineoplastic chemotherapy: Secondary | ICD-10-CM | POA: Diagnosis not present

## 2019-06-05 DIAGNOSIS — C3431 Malignant neoplasm of lower lobe, right bronchus or lung: Secondary | ICD-10-CM | POA: Insufficient documentation

## 2019-06-05 DIAGNOSIS — Z923 Personal history of irradiation: Secondary | ICD-10-CM | POA: Diagnosis not present

## 2019-06-05 DIAGNOSIS — E876 Hypokalemia: Secondary | ICD-10-CM

## 2019-06-05 LAB — CBC WITH DIFFERENTIAL (CANCER CENTER ONLY)
Abs Immature Granulocytes: 0.04 10*3/uL (ref 0.00–0.07)
Basophils Absolute: 0 10*3/uL (ref 0.0–0.1)
Basophils Relative: 0 %
Eosinophils Absolute: 0.2 10*3/uL (ref 0.0–0.5)
Eosinophils Relative: 3 %
HCT: 28.2 % — ABNORMAL LOW (ref 39.0–52.0)
Hemoglobin: 8.9 g/dL — ABNORMAL LOW (ref 13.0–17.0)
Immature Granulocytes: 1 %
Lymphocytes Relative: 31 %
Lymphs Abs: 1.8 10*3/uL (ref 0.7–4.0)
MCH: 25.4 pg — ABNORMAL LOW (ref 26.0–34.0)
MCHC: 31.6 g/dL (ref 30.0–36.0)
MCV: 80.3 fL (ref 80.0–100.0)
Monocytes Absolute: 0.9 10*3/uL (ref 0.1–1.0)
Monocytes Relative: 16 %
Neutro Abs: 2.8 10*3/uL (ref 1.7–7.7)
Neutrophils Relative %: 49 %
Platelet Count: 134 10*3/uL — ABNORMAL LOW (ref 150–400)
RBC: 3.51 MIL/uL — ABNORMAL LOW (ref 4.22–5.81)
RDW: 17.3 % — ABNORMAL HIGH (ref 11.5–15.5)
WBC Count: 5.7 10*3/uL (ref 4.0–10.5)
nRBC: 0 % (ref 0.0–0.2)

## 2019-06-05 LAB — CMP (CANCER CENTER ONLY)
ALT: 10 U/L (ref 0–44)
AST: 15 U/L (ref 15–41)
Albumin: 2.6 g/dL — ABNORMAL LOW (ref 3.5–5.0)
Alkaline Phosphatase: 100 U/L (ref 38–126)
Anion gap: 11 (ref 5–15)
BUN: 8 mg/dL (ref 6–20)
CO2: 31 mmol/L (ref 22–32)
Calcium: 8.6 mg/dL — ABNORMAL LOW (ref 8.9–10.3)
Chloride: 92 mmol/L — ABNORMAL LOW (ref 98–111)
Creatinine: 0.67 mg/dL (ref 0.61–1.24)
GFR, Est AFR Am: >60 mL/min (ref >60)
GFR, Estimated: >60 mL/min (ref >60)
Glucose, Bld: 108 mg/dL — ABNORMAL HIGH (ref 70–99)
Potassium: 2.9 mmol/L — CL (ref 3.5–5.1)
Sodium: 134 mmol/L — ABNORMAL LOW (ref 135–145)
Total Bilirubin: 0.6 mg/dL (ref 0.3–1.2)
Total Protein: 7.7 g/dL (ref 6.5–8.1)

## 2019-06-05 MED ORDER — SODIUM CHLORIDE 0.9 % IV SOLN
Freq: Once | INTRAVENOUS | Status: DC
  Filled 2019-06-05: qty 1000

## 2019-06-05 MED ORDER — SODIUM CHLORIDE 0.9 % IV SOLN
Freq: Once | INTRAVENOUS | Status: AC
  Administered 2019-06-05: 13:00:00 via INTRAVENOUS
  Filled 2019-06-05: qty 1000

## 2019-06-05 NOTE — Patient Instructions (Signed)
Hypokalemia Hypokalemia means that the amount of potassium in the blood is lower than normal. Potassium is a chemical (electrolyte) that helps regulate the amount of fluid in the body. It also stimulates muscle tightening (contraction) and helps nerves work properly. Normally, most of the body's potassium is inside cells, and only a very small amount is in the blood. Because the amount in the blood is so small, minor changes to potassium levels in the blood can be life-threatening. What are the causes? This condition may be caused by:  Antibiotic medicine.  Diarrhea or vomiting. Taking too much of a medicine that helps you have a bowel movement (laxative) can cause diarrhea and lead to hypokalemia.  Chronic kidney disease (CKD).  Medicines that help the body get rid of excess fluid (diuretics).  Eating disorders, such as bulimia.  Low magnesium levels in the body.  Sweating a lot. What are the signs or symptoms? Symptoms of this condition include:  Weakness.  Constipation.  Fatigue.  Muscle cramps.  Mental confusion.  Skipped heartbeats or irregular heartbeat (palpitations).  Tingling or numbness. How is this diagnosed? This condition is diagnosed with a blood test. How is this treated? This condition may be treated by:  Taking potassium supplements by mouth.  Adjusting the medicines that you take.  Eating more foods that contain a lot of potassium. If your potassium level is very low, you may need to get potassium through an IV and be monitored in the hospital. Follow these instructions at home:   Take over-the-counter and prescription medicines only as told by your health care provider. This includes vitamins and supplements.  Eat a healthy diet. A healthy diet includes fresh fruits and vegetables, whole grains, healthy fats, and lean proteins.  If instructed, eat more foods that contain a lot of potassium. This includes: ? Nuts, such as peanuts and pistachios.  ? Seeds, such as sunflower seeds and pumpkin seeds. ? Peas, lentils, and lima beans. ? Whole grain and bran cereals and breads. ? Fresh fruits and vegetables, such as apricots, avocado, bananas, cantaloupe, kiwi, oranges, tomatoes, asparagus, and potatoes. ? Orange juice. ? Tomato juice. ? Red meats. ? Yogurt.  Keep all follow-up visits as told by your health care provider. This is important. Contact a health care provider if you:  Have weakness that gets worse.  Feel your heart pounding or racing.  Vomit.  Have diarrhea.  Have diabetes (diabetes mellitus) and you have trouble keeping your blood sugar (glucose) in your target range. Get help right away if you:  Have chest pain.  Have shortness of breath.  Have vomiting or diarrhea that lasts for more than 2 days.  Faint. Summary  Hypokalemia means that the amount of potassium in the blood is lower than normal.  This condition is diagnosed with a blood test.  Hypokalemia may be treated by taking potassium supplements, adjusting the medicines that you take, or eating more foods that are high in potassium.  If your potassium level is very low, you may need to get potassium through an IV and be monitored in the hospital. This information is not intended to replace advice given to you by your health care provider. Make sure you discuss any questions you have with your health care provider. Document Released: 06/28/2005 Document Revised: 02/08/2018 Document Reviewed: 02/08/2018 Elsevier Patient Education  2020 [place] (COVID-19) Are you at risk?  Are you at risk for the Coronavirus (COVID-19)?  To be considered HIGH RISK for Coronavirus (COVID-19), you  have to meet the following criteria:  . Traveled to [place], [place], [place], [place] or [place]; or in the [place] to [place], [place], [place], or [place]; and have fever, cough, and shortness of breath within the last 2 weeks of travel OR .  Been in close contact with a person diagnosed with COVID-19 within the last 2 weeks and have fever, cough, and shortness of breath . IF YOU DO NOT MEET THESE CRITERIA, YOU ARE CONSIDERED LOW RISK FOR COVID-19.  What to do if you are HIGH RISK for COVID-19?  Marland Kitchen If you are having a medical emergency, call 911. . Seek medical care right away. Before you go to a doctor's office, urgent care or emergency department, call ahead and tell them about your recent travel, contact with someone diagnosed with COVID-19, and your symptoms. You should receive instructions from your physician's office regarding next steps of care.  . When you arrive at healthcare provider, tell the healthcare staff immediately you have returned from visiting [place], [place], [place], [place] or [place]; or traveled in the [place] to [place], [place], [place], or [place]; in the last two weeks or you have been in close contact with a person diagnosed with COVID-19 in the last 2 weeks.   . Tell the health care staff about your symptoms: fever, cough and shortness of breath. . After you have been seen by a medical provider, you will be either: o Tested for (COVID-19) and discharged home on quarantine except to seek medical care if symptoms worsen, and asked to  - Stay home and avoid contact with others until you get your results (4-5 days)  - Avoid travel on public transportation if possible (such as bus, train, or airplane) or o Sent to the Emergency Department by EMS for evaluation, COVID-19 testing, and possible admission depending on your condition and test results.  What to do if you are LOW RISK for COVID-19?  Reduce your risk of any infection by using the same precautions used for avoiding the common cold or flu:  Marland Kitchen Wash your hands often with soap and warm water for at least 20 seconds.  If soap and water are not readily available, use an alcohol-based hand sanitizer with at least 60% alcohol.  . If coughing or  sneezing, cover your mouth and nose by coughing or sneezing into the elbow areas of your shirt or coat, into a tissue or into your sleeve (not your hands). . Avoid shaking hands with others and consider head nods or verbal greetings only. . Avoid touching your eyes, nose, or mouth with unwashed hands.  . Avoid close contact with people who are sick. . Avoid places or events with large numbers of people in one location, like concerts or sporting events. . Carefully consider travel plans you have or are making. . If you are planning any travel outside or inside the [place], visit the CDC's Travelers' Health webpage for the latest health notices. . If you have some symptoms but not all symptoms, continue to monitor at home and seek medical attention if your symptoms worsen. . If you are having a medical emergency, call 911.   [place] / e-Visit: [email or web address]         [facility]: [facility]: [phone]                   [facility]: [phone]

## 2019-06-05 NOTE — Progress Notes (Signed)
Met with patient at registration to obtain income documentation for grant.  Patient did not bring today. Advised again what is needed and he may bring at next appointment.  He has my card for any additional financial questions or concerns.

## 2019-06-05 NOTE — Telephone Encounter (Signed)
CRITICAL VALUE STICKER  CRITICAL VALUE:K= 2.9  RECEIVER (on-site recipient of call): Abelina Bachelor  DATE & TIME NOTIFIED: 06/05/2019 1010  MESSENGER (representative from lab):lab  MD NOTIFIED: Julien Nordmann  TIME OF NOTIFICATION:1011  RESPONSE: iv potassium ordered and pt notified. Schedule message sent.

## 2019-06-05 NOTE — Progress Notes (Signed)
[place] Work  Clinical Social Work met with patient in [place] office to follow up on [place] phone interview.  Patient's SSI caseworker contacted patient via [place] phone to provide interview for final eligibility.  CSW was present during interview and assisted patient as needed.  Patient was appreciative of mediation and had no other questions.  Gwinda Maine, LCSW  Clinical Social Worker [facility]

## 2019-06-05 NOTE — Progress Notes (Signed)
Nutrition  Provided complimentary case of ensure enlive to patient today.   Clotee Schlicker B. Zenia Resides, [place], [place] Registered Dietitian [phone] (pager)

## 2019-06-11 NOTE — Progress Notes (Signed)
[place] OFFICE PROGRESS NOTE  Brand Males, MD [place] [address]  DIAGNOSIS: Stage IV(T1b, N2, M1a) non-small cell lung cancer, adenocarcinoma diagnosed in July 2020.He presented with right lower lobe pulmonary nodule in addition to subcarinal lymphadenopathyas well as bilateral pulmonary nodules.He was initially treated as a stage IIIa with concurrent chemoradiation but has evidence for disease progression after this course.  Molecular studies byGuardant 360 [id number] NArsenio Katz Rucaparib, Talazoparib Yes 0.4% [id number] (VUS) No (VUS) 1.7% FGFR1 A343A None (Synonymous)No (Synonymous) 0.1%  PRIOR THERAPY: A course of concurrent chemoradiation with weekly carboplatin for AUC of 2 and paclitaxel 45 mg/M2. Status post 7 cycles. Last dose was given on March 12, 2019. Unfortunately patient has evidence for disease progression after this treatment.  CURRENT THERAPY: Systemic chemotherapy with carboplatin for AUC of 5, Alimta 500 mg/M2 and Keytruda 200 mg IV every 3 weeks. First dose May 22, 2019. Status post 1 cycle.   INTERVAL HISTORY: RCLAYBORNE DIVIS58y.o. male returns to the clinic for a follow up visit. The patient completed his first cycle of chemotherapy recently and experienced fatigue and generalized weakness. He received IVF x2 since his treatment. He also has been experiencing decreased appetite and weight loss. He recently met with nutrition. He has been drinking ensure 2 per day. He is scheduled to meet with nutrition while in infusion again today. He recently acquired assistance [place] help of the cancer center's sEducation officer, museum Today, he continues to feel "sluggish". he denies any fever, chills, or night sweats. He reports his baseline productive cough and shortness of breath with exertion and states that his breathing is "the same". He denies chest pain or  hemoptysis. He denies nausea, vomiting, or diarrhea. He has mild constipation. He used stool softeners in the past but ran out recently. He denies headaches or visual changes. He denies any rashes or skin changes. He has questions regarding if he still requires oral potassium supplements. He is here for evaluation before starting cycle #2.   MEDICAL HISTORY: Past Medical History:  Diagnosis Date  . Alcohol abuse   . Chronic back pain   . COPD (chronic obstructive pulmonary disease) ([place]   . Dyspnea   . Lung mass    mediastinal adenopathy  . Lung nodule   . Multiple pulmonary nodules 04/02/2018  . Pneumonia   . Right lower lobe pulmonary nodule 04/02/2018   12/01/2017: PET SUV 2.1  . Tobacco abuse     ALLERGIES:  has No Known Allergies.  MEDICATIONS:  Current Outpatient Medications  Medication Sig Dispense Refill  . albuterol (PROVENTIL HFA;VENTOLIN HFA) 108 (90 Base) MCG/ACT inhaler Inhale 2 puffs into the lungs every 6 (six) hours as needed for wheezing or shortness of breath. 1 Inhaler 3  . albuterol (PROVENTIL) (2.5 MG/3ML) 0.083% nebulizer solution Take 2.5 mg by nebulization every 6 (six) hours as needed.    . feeding supplement, ENSURE ENLIVE, (ENSURE ENLIVE) LIQD Take 237 mLs by mouth 3 (three) times daily between meals. (Patient taking differently: Take 237 mLs by mouth daily. ) 90 Bottle 0  . folic acid (FOLVITE) 1 MG tablet Take 1 tablet (1 mg total) by mouth daily. 30 tablet 4  . haloperidol (HALDOL) 5 MG tablet Take 5 mg by mouth every 4 (four) hours as needed.    .Marland KitchenLORazepam (ATIVAN) 1 MG tablet Take 1 mg by mouth every 4 (four) hours as needed.    .Marland Kitchen  magnesium oxide (MAG-OX) 400 (241.3 Mg) MG tablet Take 1 tablet (400 mg total) by mouth 2 (two) times daily. 60 tablet 0  . metoprolol tartrate (LOPRESSOR) 25 MG tablet Take 1 tablet (25 mg total) by mouth 2 (two) times daily.    . Morphine Sulfate (MORPHINE CONCENTRATE) 10 mg / 0.5 ml concentrated solution Take 0.5 mLs by  mouth every 4 (four) hours as needed.    . polyethylene glycol (MIRALAX / GLYCOLAX) 17 g packet Take 17 g by mouth daily.    . prochlorperazine (COMPAZINE) 10 MG tablet Take 1 tablet (10 mg total) by mouth every 6 (six) hours as needed for nausea or vomiting. 30 tablet 0  . Rivaroxaban 15 & 20 MG TBPK Follow package directions: Take one 29m tablet by mouth twice a day. On day 22, switch to one 236mtablet once a day. Take with food. 51 each 0  . SENNA-PLUS 8.6-50 MG tablet Take 1-2 tablets by mouth daily. 30 tablet 2  . tiotropium (SPIRIVA HANDIHALER) 18 MCG inhalation capsule Place 1 capsule (18 mcg total) into inhaler and inhale daily. 90 capsule 3  . lidocaine (XYLOCAINE) 2 % solution 5 ml swallow before meals for radiation esophagitis (Patient not taking: Reported on 06/12/2019) 200 mL 0  . oxyCODONE 10 MG TABS Take 1 tablet (10 mg total) by mouth every 3 (three) hours as needed for moderate pain or severe pain. (Patient not taking: Reported on 06/12/2019)    . potassium chloride SA (KLOR-CON) 20 MEQ tablet Take 1 tablet (20 mEq total) by mouth 2 (two) times daily. 14 tablet 0  . sucralfate (CARAFATE) 1 g tablet Take 1 g by mouth 4 (four) times daily -  with meals and at bedtime.     No current facility-administered medications for this visit.    Facility-Administered Medications Ordered in Other Visits  Medication Dose Route Frequency Provider Last Rate Last Dose  . CARBOplatin (PARAPLATIN) 520 mg in sodium chloride 0.9 % 250 mL chemo infusion  520 mg Intravenous Once MoCurt BearsMD      . palonosetron (ALOXI) injection 0.25 mg  0.25 mg Intravenous Once MoCurt BearsMD      . pembrolizumab ([facility] mg in sodium chloride 0.9 % 50 mL chemo infusion  200 mg Intravenous Once MoCurt BearsMD 116 mL/hr at 06/12/19 1334 200 mg at 06/12/19 1334  . PEMEtrexed (ALIMTA) 900 mg in sodium chloride 0.9 % 100 mL chemo infusion  517 mg/m2 (Treatment Plan Recorded) Intravenous Once  MoCurt BearsMD        SURGICAL HISTORY:  Past Surgical History:  Procedure Laterality Date  . APPENDECTOMY    . VIDEO BRONCHOSCOPY WITH ENDOBRONCHIAL NAVIGATION N/A 04/05/2018   Procedure: VIDEO BRONCHOSCOPY WITH ENDOBRONCHIAL NAVIGATION;  Surgeon: IcGarner NashDO;  [facility] Thor[facility];  Laterality: N/A;  . VIDEO BRONCHOSCOPY WITH ENDOBRONCHIAL NAVIGATION N/A 01/11/2019   Procedure: VIDEO BRONCHOSCOPY WITH ENDOBRONCHIAL NAVIGATION;  Surgeon: IcGarner NashDO;  [place] [facility] Thor[facility];  Laterality: N/A;  . VIDEO BRONCHOSCOPY WITH ENDOBRONCHIAL ULTRASOUND N/A 01/11/2019   Procedure: VIDEO BRONCHOSCOPY WITH ENDOBRONCHIAL ULTRASOUND;  Surgeon: IcGarner NashDO;  [place] Thor[facility];  Laterality: N/A;  . VIDEO BRONCHOSCOPY WITH RADIAL ENDOBRONCHIAL ULTRASOUND N/A 01/11/2019   Procedure: VIDEO BRONCHOSCOPY WITH RADIAL ENDOBRONCHIAL ULTRASOUND;  Surgeon: IcGarner NashDO;  [place] [facility]: Thoracic;  Laterality: N/A;  . WISDOM TOOTH EXTRACTION  REVIEW OF SYSTEMS:   Review of Systems  Constitutional: Positive for fatigue, decreased appetite, and generalized weakness. Negative for fever.  HENT: Negative for nosebleeds, sore throat and trouble swallowing.   Eyes: Negative for eye problems and icterus.  Respiratory: Positive for dyspnea on exertion and productive cough. Negative for hemoptysis and wheezing.   Cardiovascular: Negative for chest pain and leg swelling.  Gastrointestinal: Positive for occasional constipation. Negative for abdominal pain, diarrhea, nausea and vomiting.  Genitourinary: Negative for bladder incontinence, difficulty urinating, dysuria, frequency and hematuria.   Musculoskeletal: Negative for back pain, gait problem, neck pain and neck stiffness.  Skin: Negative for itching and rash.  Neurological: Negative for dizziness, extremity weakness, gait problem, headaches, light-headedness and seizures.   Hematological: Negative for adenopathy. Does not bruise/bleed easily.  Psychiatric/Behavioral: Negative for confusion, depression and sleep disturbance. The patient is not nervous/anxious. nervous/anxious.     PHYSICAL EXAMINATION:  Blood pressure 109/73, pulse 97, temperature 97.8 F (36.6 C), temperature source Temporal, resp. rate 18, height 6' 2" (1.88 m), weight 121 lb 11.2 oz (55.2 kg), SpO2 100 %.  ECOG PERFORMANCE STATUS: 1 - Symptomatic but completely ambulatory  Physical Exam  Constitutional: Oriented to person, place, and time and thin appearing and chronically ill appearing male and in no distress.  HENT:  Head: Normocephalic and atraumatic.  Mouth/Throat: Single ulcer on left lateral tongue. Oropharynx is clear and moist. No oropharyngeal exudate.  Eyes: Conjunctivae are normal. Right eye exhibits no discharge. Left eye exhibits no discharge. No scleral icterus.  Neck: Normal range of motion. Neck supple.  Cardiovascular: Normal rate, regular rhythm, normal heart sounds and intact distal pulses.   Pulmonary/Chest: Effort normal. Decreased breath sounds in all lung fields. No respiratory distress. No wheezes. No rales.  Abdominal: Soft. Bowel sounds are normal. Exhibits no distension and no mass. There is no tenderness.  Musculoskeletal: Normal range of motion. Exhibits no edema.  Lymphadenopathy:    No cervical adenopathy.  Neurological: Alert and oriented to person, place, and time. Exhibits normal muscle tone. Gait normal. Coordination normal.  Skin: Skin is warm and dry. No rash noted. Not diaphoretic. No erythema. No pallor.  Psychiatric: Mood, memory and judgment normal.  Vitals reviewed.  LABORATORY DATA: Lab Results  Component Value Date   WBC 6.6 06/12/2019   HGB 8.1 (L) 06/12/2019   HCT 26.0 (L) 06/12/2019   MCV 79.8 (L) 06/12/2019   PLT 244 06/12/2019      Chemistry      Component Value Date/Time   NA 136 06/12/2019 1050   K 3.1 (L) 06/12/2019 1050    CL 92 (L) 06/12/2019 1050   CO2 32 06/12/2019 1050   BUN 8 06/12/2019 1050   CREATININE 0.62 06/12/2019 1050      Component Value Date/Time   CALCIUM 8.5 (L) 06/12/2019 1050   ALKPHOS 92 06/12/2019 1050   AST 15 06/12/2019 1050   ALT 9 06/12/2019 1050   BILITOT 0.3 06/12/2019 1050       RADIOGRAPHIC STUDIES:  No results found.   ASSESSMENT/PLAN:  This is a very pleasant 58 year old African-American male initially diagnosed with stage IIIa non-small cell lung cancer, adenocarcinoma in July 2020.  He presented with a right lower lobe pulmonary nodule in addition to subcarinal lymphadenopathy as well as bilateral pulmonary nodules.  He was initially treated with weekly concurrent chemoradiation with carboplatin for an AUC of 2 and paclitaxel 45 mg/m.  He had evidence of disease progression after this course with significant increase  in the tumor burden involving the left lower lobe and also now right lower lobe. There was overlying groundglass attenuation and interlobular septal thickening concerning for lymphangitic spread of tumor. The patient also has new bilateral pleural effusions right greater than left with additional small solid and sub-solid nodules involving the right upper lobe and left lung that were not significantly changed in the interval. The scan also showed the left upper lobe and left lower lobe segmental branch acute pulmonary emboli.  He has no actionable mutations.   Patient recently started palliative systemic chemotherapy with carboplatin for an AUC of 5, Alimta 500 mg/m and Keytruda 200 mg IV every 3 weeks.  He is status post his first cycle.  The patient experience significant fatigue and weakness following his first cycle of chemotherapy.  Patient was seen with Dr. Julien Nordmann today.  Dr. Julien Nordmann discussed the patient's current condition and treatment options including continuing on palliative systemic chemotherapy versus a referral to hospice.  The patient is  still interested in pursuing palliative chemotherapy. He will receive cycle #2 today as scheduled.   We will see him back for a follow up visit in 3 weeks for evaluation before starting cycle #3.   The patient will return to the clinic tomorrow to receive two units of RBCs due to his symptomatic chemotherapy induced anemia. His hemoglobin is 8.1 today.   I have sent a refill for his stool softener to his pharmacy. The patient's potassium is also low today at 3.1. He has a prescription for oral potassium supplements that he brought to the clinic today. He was advised to take 20 meq BID for 7 days. We will continue to monitor his labs/electrolytes closely on his weekly labs.   He will meet with nutrition today as scheduled for recommendations regarding his decreased appetite.   The patient was advised to call immediately if he has any concerning symptoms in the interval. The patient voices understanding of current disease status and treatment options and is in agreement with the current care plan. All questions were answered. The patient knows to call the clinic with any problems, questions or concerns. We can certainly see the patient much sooner if necessary   Orders Placed This Encounter  Procedures  . Practitioner attestation of consent    I, the ordering practitioner, attest that I have discussed with the patient the benefits, risks, side effects, alternatives, likelihood of achieving goals and potential problems during recovery for the procedure listed.    Standing Status:   Future    Standing Expiration Date:   06/11/2020    Order Specific Question:   Procedure    Answer:   Blood Product(s)  . Complete patient signature process for consent form    Standing Status:   Future    Standing Expiration Date:   06/11/2020  . Care order/instruction    Transfuse Parameters    Standing Status:   Future    Standing Expiration Date:   06/11/2020  . Type and screen    Standing Status:   Future     Standing Expiration Date:   06/11/2020     Tobe Sos Heilingoetter, PA-C 06/12/19  ADDENDUM: Hematology/Oncology Attending: I had a face-to-face encounter with the patient today.  I recommended his care plan.  This is a very pleasant 58 years old African-American male with metastatic non-small cell lung cancer, adenocarcinoma.  He is currently undergoing systemic chemotherapy with carboplatin, Alimta and Keytruda status post 1 cycle.  He tolerated the first  cycle of his treatment well except for the fatigue and weakness as well as lack of appetite.  He did not lose much weight since his last visit. I recommended for the patient to proceed with cycle #2 today as planned. We will see him back for follow-up visit in 3 weeks for evaluation before starting cycle #3. The patient was advised to call immediately if he has any concerning symptoms in the interval.  Disclaimer: This note was dictated with voice recognition software. Similar sounding words can inadvertently be transcribed and may be missed upon review. Eilleen Kempf, MD 06/12/19

## 2019-06-12 ENCOUNTER — Other Ambulatory Visit: Payer: Self-pay | Admitting: Emergency Medicine

## 2019-06-12 ENCOUNTER — Encounter: Payer: Self-pay | Admitting: Physician Assistant

## 2019-06-12 ENCOUNTER — Inpatient Hospital Stay: Payer: Medicaid Other

## 2019-06-12 ENCOUNTER — Inpatient Hospital Stay (HOSPITAL_BASED_OUTPATIENT_CLINIC_OR_DEPARTMENT_OTHER): Payer: Medicaid Other | Admitting: Physician Assistant

## 2019-06-12 ENCOUNTER — Encounter: Payer: Self-pay | Admitting: Internal Medicine

## 2019-06-12 ENCOUNTER — Other Ambulatory Visit: Payer: Self-pay

## 2019-06-12 ENCOUNTER — Inpatient Hospital Stay: Payer: Medicaid Other | Attending: Internal Medicine

## 2019-06-12 VITALS — BP 109/73 | HR 97 | Temp 97.8°F | Resp 18 | Ht 74.0 in | Wt 121.7 lb

## 2019-06-12 DIAGNOSIS — R634 Abnormal weight loss: Secondary | ICD-10-CM | POA: Diagnosis not present

## 2019-06-12 DIAGNOSIS — R1310 Dysphagia, unspecified: Secondary | ICD-10-CM | POA: Diagnosis not present

## 2019-06-12 DIAGNOSIS — Z7901 Long term (current) use of anticoagulants: Secondary | ICD-10-CM | POA: Diagnosis not present

## 2019-06-12 DIAGNOSIS — R531 Weakness: Secondary | ICD-10-CM

## 2019-06-12 DIAGNOSIS — Z79899 Other long term (current) drug therapy: Secondary | ICD-10-CM | POA: Diagnosis not present

## 2019-06-12 DIAGNOSIS — Z5111 Encounter for antineoplastic chemotherapy: Secondary | ICD-10-CM | POA: Diagnosis present

## 2019-06-12 DIAGNOSIS — K5900 Constipation, unspecified: Secondary | ICD-10-CM | POA: Diagnosis not present

## 2019-06-12 DIAGNOSIS — T451X5A Adverse effect of antineoplastic and immunosuppressive drugs, initial encounter: Secondary | ICD-10-CM | POA: Insufficient documentation

## 2019-06-12 DIAGNOSIS — R630 Anorexia: Secondary | ICD-10-CM | POA: Diagnosis not present

## 2019-06-12 DIAGNOSIS — D6481 Anemia due to antineoplastic chemotherapy: Secondary | ICD-10-CM | POA: Insufficient documentation

## 2019-06-12 DIAGNOSIS — C3491 Malignant neoplasm of unspecified part of right bronchus or lung: Secondary | ICD-10-CM

## 2019-06-12 DIAGNOSIS — R5383 Other fatigue: Secondary | ICD-10-CM

## 2019-06-12 DIAGNOSIS — Z5112 Encounter for antineoplastic immunotherapy: Secondary | ICD-10-CM | POA: Insufficient documentation

## 2019-06-12 DIAGNOSIS — E876 Hypokalemia: Secondary | ICD-10-CM

## 2019-06-12 DIAGNOSIS — J449 Chronic obstructive pulmonary disease, unspecified: Secondary | ICD-10-CM | POA: Insufficient documentation

## 2019-06-12 DIAGNOSIS — J90 Pleural effusion, not elsewhere classified: Secondary | ICD-10-CM | POA: Diagnosis not present

## 2019-06-12 DIAGNOSIS — Z86711 Personal history of pulmonary embolism: Secondary | ICD-10-CM | POA: Insufficient documentation

## 2019-06-12 DIAGNOSIS — C3431 Malignant neoplasm of lower lobe, right bronchus or lung: Secondary | ICD-10-CM | POA: Diagnosis not present

## 2019-06-12 DIAGNOSIS — D649 Anemia, unspecified: Secondary | ICD-10-CM

## 2019-06-12 LAB — CBC WITH DIFFERENTIAL (CANCER CENTER ONLY)
Abs Immature Granulocytes: 0.05 10*3/uL (ref 0.00–0.07)
Basophils Absolute: 0 10*3/uL (ref 0.0–0.1)
Basophils Relative: 1 %
Eosinophils Absolute: 0.1 10*3/uL (ref 0.0–0.5)
Eosinophils Relative: 1 %
HCT: 26 % — ABNORMAL LOW (ref 39.0–52.0)
Hemoglobin: 8.1 g/dL — ABNORMAL LOW (ref 13.0–17.0)
Immature Granulocytes: 1 %
Lymphocytes Relative: 23 %
Lymphs Abs: 1.5 10*3/uL (ref 0.7–4.0)
MCH: 24.8 pg — ABNORMAL LOW (ref 26.0–34.0)
MCHC: 31.2 g/dL (ref 30.0–36.0)
MCV: 79.8 fL — ABNORMAL LOW (ref 80.0–100.0)
Monocytes Absolute: 1.2 10*3/uL — ABNORMAL HIGH (ref 0.1–1.0)
Monocytes Relative: 19 %
Neutro Abs: 3.7 10*3/uL (ref 1.7–7.7)
Neutrophils Relative %: 55 %
Platelet Count: 244 10*3/uL (ref 150–400)
RBC: 3.26 MIL/uL — ABNORMAL LOW (ref 4.22–5.81)
RDW: 17.2 % — ABNORMAL HIGH (ref 11.5–15.5)
WBC Count: 6.6 10*3/uL (ref 4.0–10.5)
nRBC: 0 % (ref 0.0–0.2)

## 2019-06-12 LAB — CMP (CANCER CENTER ONLY)
ALT: 9 U/L (ref 0–44)
AST: 15 U/L (ref 15–41)
Albumin: 2.5 g/dL — ABNORMAL LOW (ref 3.5–5.0)
Alkaline Phosphatase: 92 U/L (ref 38–126)
Anion gap: 12 (ref 5–15)
BUN: 8 mg/dL (ref 6–20)
CO2: 32 mmol/L (ref 22–32)
Calcium: 8.5 mg/dL — ABNORMAL LOW (ref 8.9–10.3)
Chloride: 92 mmol/L — ABNORMAL LOW (ref 98–111)
Creatinine: 0.62 mg/dL (ref 0.61–1.24)
GFR, Est AFR Am: >60 mL/min (ref >60)
GFR, Estimated: >60 mL/min (ref >60)
Glucose, Bld: 78 mg/dL (ref 70–99)
Potassium: 3.1 mmol/L — ABNORMAL LOW (ref 3.5–5.1)
Sodium: 136 mmol/L (ref 135–145)
Total Bilirubin: 0.3 mg/dL (ref 0.3–1.2)
Total Protein: 7.5 g/dL (ref 6.5–8.1)

## 2019-06-12 LAB — PREPARE RBC (CROSSMATCH)

## 2019-06-12 LAB — TSH: TSH: 2.075 u[IU]/mL (ref 0.320–4.118)

## 2019-06-12 MED ORDER — PALONOSETRON HCL INJECTION 0.25 MG/5ML: 0.2500 mg | Freq: Once | INTRAVENOUS | Status: DC

## 2019-06-12 MED ORDER — SODIUM CHLORIDE 0.9 % IV SOLN
517.0000 mg/m2 | Freq: Once | INTRAVENOUS | Status: AC
  Administered 2019-06-12: 900 mg via INTRAVENOUS
  Filled 2019-06-12: qty 20

## 2019-06-12 MED ORDER — SODIUM CHLORIDE 0.9 % IV SOLN
Freq: Once | INTRAVENOUS | Status: AC
  Administered 2019-06-12: 12:00:00 via INTRAVENOUS
  Filled 2019-06-12: qty 5

## 2019-06-12 MED ORDER — SODIUM CHLORIDE 0.9 % IV SOLN
517.5000 mg | Freq: Once | INTRAVENOUS | Status: AC
  Administered 2019-06-12: 520 mg via INTRAVENOUS
  Filled 2019-06-12: qty 52

## 2019-06-12 MED ORDER — SENNA-PLUS 8.6-50 MG PO TABS: 1.0000 | ORAL_TABLET | Freq: Every day | ORAL | 2 refills | Status: DC

## 2019-06-12 MED ORDER — POTASSIUM CHLORIDE CRYS ER 20 MEQ PO TBCR: 20.0000 meq | EXTENDED_RELEASE_TABLET | Freq: Two times a day (BID) | ORAL | 0 refills | Status: DC

## 2019-06-12 MED ORDER — SODIUM CHLORIDE 0.9 % IV SOLN
200.0000 mg | Freq: Once | INTRAVENOUS | Status: AC
  Administered 2019-06-12: 200 mg via INTRAVENOUS
  Filled 2019-06-12: qty 8

## 2019-06-12 MED ORDER — SODIUM CHLORIDE 0.9 % IV SOLN
Freq: Once | INTRAVENOUS | Status: AC
  Administered 2019-06-12: 12:00:00 via INTRAVENOUS
  Filled 2019-06-12: qty 250

## 2019-06-12 MED ORDER — PALONOSETRON HCL INJECTION 0.25 MG/5ML
INTRAVENOUS | Status: AC
  Filled 2019-06-12: qty 5

## 2019-06-12 NOTE — Progress Notes (Signed)
Met with patient for grant.  Patient approved for one-time $700 [place] to assist with personal expenses while going through treatment. Gave him a copy of the approval letter as well as expense sheet and Outpatient pharmacy information. He received a gas card today from grant.  He has my card for any additional financial questions or concerns.

## 2019-06-12 NOTE — Progress Notes (Signed)
Followed up with [facility].  Assessment was completed 06/01/2019.  They are pending response from patient for 3 agencies that he would like for them to send his referral to (required per his insurance).  Printed list and gave the list with number to call to patient.  He is aware that he must call to let them know or else they won't finish the referral.

## 2019-06-12 NOTE — Progress Notes (Signed)
Nutrition Follow-up:  Patient with non-small cell lung cancer with progression.  Noted hospital admission and discharge to [facility] (hospice).  Patient discharged himself from hospice per MD note.  Patient receiving palliative chemotherapy of carboplatin, alimta, keytruda.  Met with patient during infusion.  Eating [place] sandwich during visit.  Reports that appetite is poor.  Mainly eats about 1 meal per day.  Tries to drink 2 ensure per day.  Denies nausea.      Medications: reviewed  Labs: reviewed  Anthropometrics:   Weight 121 lb 11.2 oz on 12/1 increased from 118 lb on 11/17.    NUTRITION DIAGNOSIS: Underweight continues   INTERVENTION:  Reviewed strategies to increase calories and protein. Encouraged patient to continue ensure enlive and provided 2nd complimentary case of ensure enlive to patient today.  Contact information provided    MONITORING, EVALUATION, GOAL: Patient will tolerate increased calories and protein to minimize weight loss   NEXT VISIT: Dec 22 during infusion  Stefannie Defeo B. Zenia Resides, [place], [place] Registered Dietitian [phone] (pager)

## 2019-06-12 NOTE — Patient Instructions (Signed)
[facility] Discharge Instructions for Patients Receiving Chemotherapy  Today you received the following chemotherapy agents Keytruda, Alimta, Carboplatin.  To help prevent nausea and vomiting after your treatment, we encourage you to take your nausea medication. DO NOT TAKE ZOFRAN FOR THREE DAYS AFTER TREATMENT.   If you develop nausea and vomiting that is not controlled by your nausea medication, call the clinic.   BELOW ARE SYMPTOMS THAT SHOULD BE REPORTED IMMEDIATELY:  *FEVER GREATER THAN 100.5 F  *CHILLS WITH OR WITHOUT FEVER  NAUSEA AND VOMITING THAT IS NOT CONTROLLED WITH YOUR NAUSEA MEDICATION  *UNUSUAL SHORTNESS OF BREATH  *UNUSUAL BRUISING OR BLEEDING  TENDERNESS IN MOUTH AND THROAT WITH OR WITHOUT PRESENCE OF ULCERS  *URINARY PROBLEMS  *BOWEL PROBLEMS  UNUSUAL RASH Items with * indicate a potential emergency and should be followed up as soon as possible.  Feel free to call the clinic should you have any questions or concerns. The clinic phone number is [phone].  Please show the [facility] at check-in to the Emergency Department and triage nurse.

## 2019-06-13 ENCOUNTER — Other Ambulatory Visit: Payer: Self-pay | Admitting: Emergency Medicine

## 2019-06-13 ENCOUNTER — Other Ambulatory Visit: Payer: Medicaid Other

## 2019-06-13 ENCOUNTER — Inpatient Hospital Stay (HOSPITAL_BASED_OUTPATIENT_CLINIC_OR_DEPARTMENT_OTHER): Payer: Medicaid Other

## 2019-06-13 ENCOUNTER — Telehealth: Payer: Self-pay | Admitting: Internal Medicine

## 2019-06-13 ENCOUNTER — Other Ambulatory Visit: Payer: Self-pay

## 2019-06-13 DIAGNOSIS — T451X5A Adverse effect of antineoplastic and immunosuppressive drugs, initial encounter: Secondary | ICD-10-CM

## 2019-06-13 DIAGNOSIS — C3431 Malignant neoplasm of lower lobe, right bronchus or lung: Secondary | ICD-10-CM | POA: Diagnosis not present

## 2019-06-13 DIAGNOSIS — D6481 Anemia due to antineoplastic chemotherapy: Secondary | ICD-10-CM

## 2019-06-13 MED ORDER — ACETAMINOPHEN 325 MG PO TABS
ORAL_TABLET | ORAL | Status: AC
  Filled 2019-06-13: qty 2

## 2019-06-13 MED ORDER — SODIUM CHLORIDE 0.9% IV SOLUTION
250.0000 mL | Freq: Once | INTRAVENOUS | Status: AC
  Administered 2019-06-13: 250 mL via INTRAVENOUS
  Filled 2019-06-13: qty 250

## 2019-06-13 MED ORDER — DIPHENHYDRAMINE HCL 25 MG PO CAPS
25.0000 mg | ORAL_CAPSULE | Freq: Once | ORAL | Status: AC
  Administered 2019-06-13: 25 mg via ORAL

## 2019-06-13 MED ORDER — DIPHENHYDRAMINE HCL 25 MG PO CAPS
ORAL_CAPSULE | ORAL | Status: AC
  Filled 2019-06-13: qty 1

## 2019-06-13 MED ORDER — ACETAMINOPHEN 325 MG PO TABS
650.0000 mg | ORAL_TABLET | Freq: Once | ORAL | Status: AC
  Administered 2019-06-13: 09:00:00 650 mg via ORAL

## 2019-06-13 NOTE — Telephone Encounter (Signed)
Scheduled per los. Called and left msg. Mailed printout  °

## 2019-06-13 NOTE — Progress Notes (Signed)
Pt received 2 units PRBCs today, tolerated well.  Ate & drank during infusions without any issues.  VSS.  Reports feeling better at end of tx.  Ambulatory to exit with cane and belongings, verbalizes understanding to f/u as needed.

## 2019-06-13 NOTE — Patient Instructions (Signed)
Blood Transfusion, Adult, Care After This sheet gives you information about how to care for yourself after your procedure. Your doctor may also give you more specific instructions. If you have problems or questions, contact your doctor. Follow these instructions at home:   Take over-the-counter and prescription medicines only as told by your doctor.  Go back to your normal activities as told by your doctor.  Follow instructions from your doctor about how to take care of the area where an IV tube was put into your vein (insertion site). Make sure you: ? Wash your hands with soap and water before you change your bandage (dressing). If there is no soap and water, use hand sanitizer. ? Change your bandage as told by your doctor.  Check your IV insertion site every day for signs of infection. Check for: ? More redness, swelling, or pain. ? More fluid or blood. ? Warmth. ? Pus or a bad smell. Contact a doctor if:  You have more redness, swelling, or pain around the IV insertion site.  You have more fluid or blood coming from the IV insertion site.  Your IV insertion site feels warm to the touch.  You have pus or a bad smell coming from the IV insertion site.  Your pee (urine) turns pink, red, or brown.  You feel weak after doing your normal activities. Get help right away if:  You have signs of a serious allergic or body defense (immune) system reaction, including: ? Itchiness. ? Hives. ? Trouble breathing. ? Anxiety. ? Pain in your chest or lower back. ? Fever, flushing, and chills. ? Fast pulse. ? Rash. ? Watery poop (diarrhea). ? Throwing up (vomiting). ? Dark pee. ? Serious headache. ? Dizziness. ? Stiff neck. ? Yellow color in your face or the white parts of your eyes (jaundice). Summary  After a blood transfusion, return to your normal activities as told by your doctor.  Every day, check for signs of infection where the IV tube was put into your vein.  Some  signs of infection are warm skin, more redness and pain, more fluid or blood, and pus or a bad smell where the needle went in.  Contact your doctor if you feel weak or have any unusual symptoms. This information is not intended to replace advice given to you by your health care provider. Make sure you discuss any questions you have with your health care provider. Document Released: 07/19/2014 Document Revised: 11/02/2017 Document Reviewed: 02/20/2016 Elsevier Patient Education  2020 Elsevier Inc.  

## 2019-06-14 LAB — BPAM RBC
Blood Product Expiration Date: 202012142359
Blood Product Expiration Date: 202012292359
ISSUE DATE / TIME: 202012020939
ISSUE DATE / TIME: 202012020939
Unit Type and Rh: 7300
Unit Type and Rh: 7300

## 2019-06-14 LAB — TYPE AND SCREEN
ABO/RH(D): B POS
Antibody Screen: NEGATIVE
Unit division: 0
Unit division: 0

## 2019-06-19 ENCOUNTER — Other Ambulatory Visit: Payer: Self-pay

## 2019-06-19 ENCOUNTER — Inpatient Hospital Stay: Payer: Medicaid Other

## 2019-06-19 ENCOUNTER — Inpatient Hospital Stay: Payer: Medicaid Other | Admitting: *Deleted

## 2019-06-19 DIAGNOSIS — C3491 Malignant neoplasm of unspecified part of right bronchus or lung: Secondary | ICD-10-CM

## 2019-06-19 DIAGNOSIS — C3431 Malignant neoplasm of lower lobe, right bronchus or lung: Secondary | ICD-10-CM | POA: Diagnosis not present

## 2019-06-19 LAB — CBC WITH DIFFERENTIAL (CANCER CENTER ONLY)
Abs Immature Granulocytes: 0.01 10*3/uL (ref 0.00–0.07)
Basophils Absolute: 0 10*3/uL (ref 0.0–0.1)
Basophils Relative: 1 %
Eosinophils Absolute: 0 10*3/uL (ref 0.0–0.5)
Eosinophils Relative: 0 %
HCT: 34.4 % — ABNORMAL LOW (ref 39.0–52.0)
Hemoglobin: 10.9 g/dL — ABNORMAL LOW (ref 13.0–17.0)
Immature Granulocytes: 1 %
Lymphocytes Relative: 49 %
Lymphs Abs: 1 10*3/uL (ref 0.7–4.0)
MCH: 25.4 pg — ABNORMAL LOW (ref 26.0–34.0)
MCHC: 31.7 g/dL (ref 30.0–36.0)
MCV: 80.2 fL (ref 80.0–100.0)
Monocytes Absolute: 0.1 10*3/uL (ref 0.1–1.0)
Monocytes Relative: 5 %
Neutro Abs: 0.9 10*3/uL — ABNORMAL LOW (ref 1.7–7.7)
Neutrophils Relative %: 44 %
Platelet Count: 154 10*3/uL (ref 150–400)
RBC: 4.29 MIL/uL (ref 4.22–5.81)
RDW: 17 % — ABNORMAL HIGH (ref 11.5–15.5)
WBC Count: 2 10*3/uL — ABNORMAL LOW (ref 4.0–10.5)
nRBC: 0 % (ref 0.0–0.2)

## 2019-06-19 LAB — CMP (CANCER CENTER ONLY)
ALT: 19 U/L (ref 0–44)
AST: 31 U/L (ref 15–41)
Albumin: 2.7 g/dL — ABNORMAL LOW (ref 3.5–5.0)
Alkaline Phosphatase: 95 U/L (ref 38–126)
Anion gap: 10 (ref 5–15)
BUN: 10 mg/dL (ref 6–20)
CO2: 34 mmol/L — ABNORMAL HIGH (ref 22–32)
Calcium: 8.8 mg/dL — ABNORMAL LOW (ref 8.9–10.3)
Chloride: 92 mmol/L — ABNORMAL LOW (ref 98–111)
Creatinine: 0.66 mg/dL (ref 0.61–1.24)
GFR, Est AFR Am: >60 mL/min (ref >60)
GFR, Estimated: >60 mL/min (ref >60)
Glucose, Bld: 103 mg/dL — ABNORMAL HIGH (ref 70–99)
Potassium: 3.2 mmol/L — ABNORMAL LOW (ref 3.5–5.1)
Sodium: 136 mmol/L (ref 135–145)
Total Bilirubin: 0.4 mg/dL (ref 0.3–1.2)
Total Protein: 7.8 g/dL (ref 6.5–8.1)

## 2019-06-26 ENCOUNTER — Other Ambulatory Visit: Payer: Self-pay

## 2019-06-26 ENCOUNTER — Inpatient Hospital Stay: Payer: Medicaid Other

## 2019-06-26 DIAGNOSIS — C3431 Malignant neoplasm of lower lobe, right bronchus or lung: Secondary | ICD-10-CM | POA: Diagnosis not present

## 2019-06-26 DIAGNOSIS — C3491 Malignant neoplasm of unspecified part of right bronchus or lung: Secondary | ICD-10-CM

## 2019-06-26 LAB — CBC WITH DIFFERENTIAL (CANCER CENTER ONLY)
Abs Immature Granulocytes: 0.01 10*3/uL (ref 0.00–0.07)
Basophils Absolute: 0 10*3/uL (ref 0.0–0.1)
Basophils Relative: 0 %
Eosinophils Absolute: 0.1 10*3/uL (ref 0.0–0.5)
Eosinophils Relative: 3 %
HCT: 30.2 % — ABNORMAL LOW (ref 39.0–52.0)
Hemoglobin: 9.7 g/dL — ABNORMAL LOW (ref 13.0–17.0)
Immature Granulocytes: 0 %
Lymphocytes Relative: 52 %
Lymphs Abs: 1.8 10*3/uL (ref 0.7–4.0)
MCH: 25.1 pg — ABNORMAL LOW (ref 26.0–34.0)
MCHC: 32.1 g/dL (ref 30.0–36.0)
MCV: 78.2 fL — ABNORMAL LOW (ref 80.0–100.0)
Monocytes Absolute: 0.9 10*3/uL (ref 0.1–1.0)
Monocytes Relative: 25 %
Neutro Abs: 0.7 10*3/uL — ABNORMAL LOW (ref 1.7–7.7)
Neutrophils Relative %: 20 %
Platelet Count: 56 10*3/uL — ABNORMAL LOW (ref 150–400)
RBC: 3.86 MIL/uL — ABNORMAL LOW (ref 4.22–5.81)
RDW: 16.6 % — ABNORMAL HIGH (ref 11.5–15.5)
WBC Count: 3.5 10*3/uL — ABNORMAL LOW (ref 4.0–10.5)
nRBC: 0 % (ref 0.0–0.2)

## 2019-06-26 LAB — CMP (CANCER CENTER ONLY)
ALT: 11 U/L (ref 0–44)
AST: 17 U/L (ref 15–41)
Albumin: 2.8 g/dL — ABNORMAL LOW (ref 3.5–5.0)
Alkaline Phosphatase: 106 U/L (ref 38–126)
Anion gap: 13 (ref 5–15)
BUN: 8 mg/dL (ref 6–20)
CO2: 30 mmol/L (ref 22–32)
Calcium: 8.8 mg/dL — ABNORMAL LOW (ref 8.9–10.3)
Chloride: 90 mmol/L — ABNORMAL LOW (ref 98–111)
Creatinine: 0.71 mg/dL (ref 0.61–1.24)
GFR, Est AFR Am: >60 mL/min (ref >60)
GFR, Estimated: >60 mL/min (ref >60)
Glucose, Bld: 115 mg/dL — ABNORMAL HIGH (ref 70–99)
Potassium: 3.1 mmol/L — ABNORMAL LOW (ref 3.5–5.1)
Sodium: 133 mmol/L — ABNORMAL LOW (ref 135–145)
Total Bilirubin: 0.6 mg/dL (ref 0.3–1.2)
Total Protein: 8.2 g/dL — ABNORMAL HIGH (ref 6.5–8.1)

## 2019-06-26 LAB — TSH: TSH: 2.031 u[IU]/mL (ref 0.320–4.118)

## 2019-07-02 ENCOUNTER — Telehealth: Payer: Self-pay | Admitting: Medical Oncology

## 2019-07-02 ENCOUNTER — Other Ambulatory Visit: Payer: Self-pay | Admitting: Medical Oncology

## 2019-07-02 DIAGNOSIS — E876 Hypokalemia: Secondary | ICD-10-CM

## 2019-07-02 MED ORDER — POTASSIUM CHLORIDE CRYS ER 20 MEQ PO TBCR: 20.0000 meq | EXTENDED_RELEASE_TABLET | Freq: Two times a day (BID) | ORAL | 0 refills | Status: DC

## 2019-07-02 NOTE — Telephone Encounter (Signed)
Pt notified to pick up kdur rx and start today.Pt stated he has a full "jar " of potassium and he has been taking it everyday. He will bring it in tomorrow.

## 2019-07-03 ENCOUNTER — Inpatient Hospital Stay (HOSPITAL_BASED_OUTPATIENT_CLINIC_OR_DEPARTMENT_OTHER): Payer: Medicaid Other | Admitting: Internal Medicine

## 2019-07-03 ENCOUNTER — Inpatient Hospital Stay: Payer: Medicaid Other

## 2019-07-03 ENCOUNTER — Encounter: Payer: Self-pay | Admitting: Internal Medicine

## 2019-07-03 ENCOUNTER — Other Ambulatory Visit: Payer: Self-pay

## 2019-07-03 VITALS — BP 102/88 | HR 73 | Temp 98.0°F | Resp 18 | Ht 74.0 in | Wt 118.7 lb

## 2019-07-03 DIAGNOSIS — C3490 Malignant neoplasm of unspecified part of unspecified bronchus or lung: Secondary | ICD-10-CM | POA: Diagnosis not present

## 2019-07-03 DIAGNOSIS — Z720 Tobacco use: Secondary | ICD-10-CM

## 2019-07-03 DIAGNOSIS — C3431 Malignant neoplasm of lower lobe, right bronchus or lung: Secondary | ICD-10-CM | POA: Diagnosis not present

## 2019-07-03 DIAGNOSIS — Z5111 Encounter for antineoplastic chemotherapy: Secondary | ICD-10-CM | POA: Diagnosis not present

## 2019-07-03 DIAGNOSIS — Z5112 Encounter for antineoplastic immunotherapy: Secondary | ICD-10-CM

## 2019-07-03 DIAGNOSIS — C3491 Malignant neoplasm of unspecified part of right bronchus or lung: Secondary | ICD-10-CM

## 2019-07-03 LAB — CBC WITH DIFFERENTIAL (CANCER CENTER ONLY)
Abs Immature Granulocytes: 0.09 10*3/uL — ABNORMAL HIGH (ref 0.00–0.07)
Basophils Absolute: 0 10*3/uL (ref 0.0–0.1)
Basophils Relative: 0 %
Eosinophils Absolute: 0.1 10*3/uL (ref 0.0–0.5)
Eosinophils Relative: 2 %
HCT: 31.4 % — ABNORMAL LOW (ref 39.0–52.0)
Hemoglobin: 9.7 g/dL — ABNORMAL LOW (ref 13.0–17.0)
Immature Granulocytes: 1 %
Lymphocytes Relative: 22 %
Lymphs Abs: 1.5 10*3/uL (ref 0.7–4.0)
MCH: 25 pg — ABNORMAL LOW (ref 26.0–34.0)
MCHC: 30.9 g/dL (ref 30.0–36.0)
MCV: 80.9 fL (ref 80.0–100.0)
Monocytes Absolute: 1.4 10*3/uL — ABNORMAL HIGH (ref 0.1–1.0)
Monocytes Relative: 21 %
Neutro Abs: 3.7 10*3/uL (ref 1.7–7.7)
Neutrophils Relative %: 54 %
Platelet Count: 241 10*3/uL (ref 150–400)
RBC: 3.88 MIL/uL — ABNORMAL LOW (ref 4.22–5.81)
RDW: 17.7 % — ABNORMAL HIGH (ref 11.5–15.5)
WBC Count: 6.8 10*3/uL (ref 4.0–10.5)
nRBC: 0 % (ref 0.0–0.2)

## 2019-07-03 LAB — CMP (CANCER CENTER ONLY)
ALT: 6 U/L (ref 0–44)
AST: 20 U/L (ref 15–41)
Albumin: 2.7 g/dL — ABNORMAL LOW (ref 3.5–5.0)
Alkaline Phosphatase: 120 U/L (ref 38–126)
Anion gap: 12 (ref 5–15)
BUN: 6 mg/dL (ref 6–20)
CO2: 30 mmol/L (ref 22–32)
Calcium: 8.7 mg/dL — ABNORMAL LOW (ref 8.9–10.3)
Chloride: 97 mmol/L — ABNORMAL LOW (ref 98–111)
Creatinine: 0.66 mg/dL (ref 0.61–1.24)
GFR, Est AFR Am: >60 mL/min (ref >60)
GFR, Estimated: >60 mL/min (ref >60)
Glucose, Bld: 95 mg/dL (ref 70–99)
Potassium: 3.8 mmol/L (ref 3.5–5.1)
Sodium: 139 mmol/L (ref 135–145)
Total Bilirubin: 0.3 mg/dL (ref 0.3–1.2)
Total Protein: 8.3 g/dL — ABNORMAL HIGH (ref 6.5–8.1)

## 2019-07-03 MED ORDER — SODIUM CHLORIDE 0.9 % IV SOLN
519.0000 mg/m2 | Freq: Once | INTRAVENOUS | Status: AC
  Administered 2019-07-03: 900 mg via INTRAVENOUS
  Filled 2019-07-03: qty 20

## 2019-07-03 MED ORDER — PALONOSETRON HCL INJECTION 0.25 MG/5ML
INTRAVENOUS | Status: AC
  Filled 2019-07-03: qty 5

## 2019-07-03 MED ORDER — SODIUM CHLORIDE 0.9 % IV SOLN
200.0000 mg | Freq: Once | INTRAVENOUS | Status: AC
  Administered 2019-07-03: 12:00:00 200 mg via INTRAVENOUS
  Filled 2019-07-03: qty 8

## 2019-07-03 MED ORDER — SODIUM CHLORIDE 0.9 % IV SOLN
517.5000 mg | Freq: Once | INTRAVENOUS | Status: AC
  Administered 2019-07-03: 520 mg via INTRAVENOUS
  Filled 2019-07-03: qty 52

## 2019-07-03 MED ORDER — PALONOSETRON HCL INJECTION 0.25 MG/5ML
0.2500 mg | Freq: Once | INTRAVENOUS | Status: AC
  Administered 2019-07-03: 0.25 mg via INTRAVENOUS

## 2019-07-03 MED ORDER — CYANOCOBALAMIN 1000 MCG/ML IJ SOLN
1000.0000 ug | Freq: Once | INTRAMUSCULAR | Status: AC
  Administered 2019-07-03: 1000 ug via INTRAMUSCULAR

## 2019-07-03 MED ORDER — CYANOCOBALAMIN 1000 MCG/ML IJ SOLN
INTRAMUSCULAR | Status: AC
  Filled 2019-07-03: qty 1

## 2019-07-03 MED ORDER — SODIUM CHLORIDE 0.9 % IV SOLN
Freq: Once | INTRAVENOUS | Status: AC
  Filled 2019-07-03: qty 5

## 2019-07-03 MED ORDER — SODIUM CHLORIDE 0.9 % IV SOLN
Freq: Once | INTRAVENOUS | Status: AC
  Filled 2019-07-03: qty 250

## 2019-07-03 MED ORDER — OXYCODONE-ACETAMINOPHEN 5-325 MG PO TABS: 1.0000 | ORAL_TABLET | Freq: Three times a day (TID) | ORAL | 0 refills | Status: DC | PRN

## 2019-07-03 NOTE — Progress Notes (Signed)
Nutrition Follow-up:  Patient with non-small cell lung cancer with progression.  Patient receiving palliative chemotherapy.  Met with patient during infusion.  Patient reports that his appetite is good but having a hard time chewing foods due to poor dentition.  Reports takes him 15 minutes to eat one bite of hamburger.  Reports that he is mainly eating peanut butter and jelly sandwichs, oatmeal, eggs, grits, applesauce.  Drinking ensure enlive.  Reports feels fatigued and no energy.    Medications: reviewed  Labs: reviewed  Anthropometrics:   Weight 118 lb today decreased from 121 lb 11.2 oz on 12/1   NUTRITION DIAGNOSIS: Underweight continues   INTERVENTION:  Discussed high calorie, high protein foods that are soft and moist and easy to chew.   Provided another case of ensure enlive. Patient has contact information    MONITORING, EVALUATION, GOAL: Patient will tolerate increased calories and protein to minimize weight loss   NEXT VISIT: to be determined  Amia Rynders B. Zenia Resides, [place], [place] Registered Dietitian [phone] (pager)

## 2019-07-03 NOTE — Progress Notes (Signed)
[facility] [phone]   [phone]  OFFICE PROGRESS NOTE  Brand Males, MD [place] [address]  DIAGNOSIS: Stage IV (T1b, N2, M1a) non-small cell lung cancer, adenocarcinoma diagnosed in July 2020. He presented with right lower lobe pulmonary nodule in addition to subcarinal lymphadenopathy as well as bilateral pulmonary nodules.  He was initially treated as a stage IIIa with concurrent chemoradiation but has evidence for disease progression after this course.  Molecular studies by [employer] 360 [id number] NJari PiggTalazoparib Yes 0.4% [id number] (VUS)  No (VUS) 1.7% FGFR1 A343A None (Synonymous) No (Synonymous)  0.1%  PRIOR THERAPY: A course of concurrent chemoradiation with weekly carboplatin for AUC of 2 and paclitaxel 45 mg/M2.  Status post 7 cycles.  Last dose was given on March 12, 2019.  Unfortunately patient has evidence for disease progression after this treatment.  CURRENT THERAPY: Systemic chemotherapy with carboplatin for AUC of 5, Alimta 500 mg/M2 and Keytruda 200 mg IV every 3 weeks.  First dose May 22, 2019.  Status post 2 cycles.  INTERVAL HISTORY: Tyler CULLENS58y.o. male returns to the clinic today for follow-up visit.  The patient is feeling fine today with no concerning complaints except for the baseline fatigue and occasional pain in the chest.  He is requesting refill of his pain medication.  He lost few more pounds since his last visit but he eats better and drinks Ensure daily.  The patient denied having any shortness of breath, cough or hemoptysis.  He denied having any fever or chills.  He has no nausea, vomiting, diarrhea or constipation.  He has no headache or visual changes.  He tolerated the second cycle of his chemotherapy fairly well.  He is here today for evaluation before starting cycle #3.  MEDICAL HISTORY: Past Medical History:    Diagnosis Date  . Alcohol abuse   . Chronic back pain   . COPD (chronic obstructive pulmonary disease) ([facility]   . Dyspnea   . Lung mass    mediastinal adenopathy  . Lung nodule   . Multiple pulmonary nodules 04/02/2018  . Pneumonia   . Right lower lobe pulmonary nodule 04/02/2018   12/01/2017: PET SUV 2.1  . Tobacco abuse     ALLERGIES:  has No Known Allergies.  MEDICATIONS:  Current Outpatient Medications  Medication Sig Dispense Refill  . albuterol (PROVENTIL HFA;VENTOLIN HFA) 108 (90 Base) MCG/ACT inhaler Inhale 2 puffs into the lungs every 6 (six) hours as needed for wheezing or shortness of breath. 1 Inhaler 3  . albuterol (PROVENTIL) (2.5 MG/3ML) 0.083% nebulizer solution Take 2.5 mg by nebulization every 6 (six) hours as needed.    . feeding supplement, ENSURE ENLIVE, (ENSURE ENLIVE) LIQD Take 237 mLs by mouth 3 (three) times daily between meals. (Patient taking differently: Take 237 mLs by mouth daily. ) 90 Bottle 0  . folic acid (FOLVITE) 1 MG tablet Take 1 tablet (1 mg total) by mouth daily. 30 tablet 4  . haloperidol (HALDOL) 5 MG tablet Take 5 mg by mouth every 4 (four) hours as needed.    . lidocaine (XYLOCAINE) 2 % solution 5 ml swallow before meals for radiation esophagitis (Patient not taking: Reported on 06/12/2019) 200 mL 0  . LORazepam (ATIVAN) 1 MG tablet Take 1 mg by mouth every 4 (four) hours as needed.    . magnesium oxide (MAG-OX) 400 (241.3 Mg) MG tablet Take 1 tablet (400  mg total) by mouth 2 (two) times daily. 60 tablet 0  . metoprolol tartrate (LOPRESSOR) 25 MG tablet Take 1 tablet (25 mg total) by mouth 2 (two) times daily.    . Morphine Sulfate (MORPHINE CONCENTRATE) 10 mg / 0.5 ml concentrated solution Take 0.5 mLs by mouth every 4 (four) hours as needed.    Marland Kitchen oxyCODONE 10 MG TABS Take 1 tablet (10 mg total) by mouth every 3 (three) hours as needed for moderate pain or severe pain. (Patient not taking: Reported on 06/12/2019)    . polyethylene glycol  (MIRALAX / GLYCOLAX) 17 g packet Take 17 g by mouth daily.    . potassium chloride SA (KLOR-CON) 20 MEQ tablet Take 1 tablet (20 mEq total) by mouth 2 (two) times daily. 14 tablet 0  . prochlorperazine (COMPAZINE) 10 MG tablet Take 1 tablet (10 mg total) by mouth every 6 (six) hours as needed for nausea or vomiting. 30 tablet 0  . Rivaroxaban 15 & 20 MG TBPK Follow package directions: Take one 74m tablet by mouth twice a day. On day 22, switch to one 264mtablet once a day. Take with food. 51 each 0  . SENNA-PLUS 8.6-50 MG tablet Take 1-2 tablets by mouth daily. 30 tablet 2  . sucralfate (CARAFATE) 1 g tablet Take 1 g by mouth 4 (four) times daily -  with meals and at bedtime.    . Marland Kitcheniotropium (SPIRIVA HANDIHALER) 18 MCG inhalation capsule Place 1 capsule (18 mcg total) into inhaler and inhale daily. 90 capsule 3   No current facility-administered medications for this visit.    SURGICAL HISTORY:  Past Surgical History:  Procedure Laterality Date  . APPENDECTOMY    . VIDEO BRONCHOSCOPY WITH ENDOBRONCHIAL NAVIGATION N/A 04/05/2018   Procedure: VIDEO BRONCHOSCOPY WITH ENDOBRONCHIAL NAVIGATION;  Surgeon: IcGarner NashDO;  [facility];  Laterality: N/A;  . VIDEO BRONCHOSCOPY WITH ENDOBRONCHIAL NAVIGATION N/A 01/11/2019   Procedure: VIDEO BRONCHOSCOPY WITH ENDOBRONCHIAL NAVIGATION;  Surgeon: IcGarner NashDO;  [facility]: Thor[facility];  Laterality: N/A;  . VIDEO BRONCHOSCOPY WITH ENDOBRONCHIAL ULTRASOUND N/A 01/11/2019   Procedure: VIDEO BRONCHOSCOPY WITH ENDOBRONCHIAL ULTRASOUND;  Surgeon: IcGarner NashDO;  [facility];  Laterality: N/A;  . VIDEO BRONCHOSCOPY WITH RADIAL ENDOBRONCHIAL ULTRASOUND N/A 01/11/2019   Procedure: VIDEO BRONCHOSCOPY WITH RADIAL ENDOBRONCHIAL ULTRASOUND;  Surgeon: IcGarner NashDO;  Location: [facility];  Service: Thoracic;  Laterality: N/A;  . WISDOM TOOTH EXTRACTION      REVIEW OF SYSTEMS:  A  comprehensive review of systems was negative except for: Constitutional: positive for anorexia, fatigue and weight loss Respiratory: positive for pleurisy/chest pain   PHYSICAL EXAMINATION: General appearance: alert, cooperative, fatigued and no distress Head: Normocephalic, without obvious abnormality, atraumatic Neck: no adenopathy, no JVD, supple, symmetrical, trachea midline and thyroid not enlarged, symmetric, no tenderness/mass/nodules Lymph nodes: Cervical, supraclavicular, and axillary nodes normal. Resp: clear to auscultation bilaterally Back: symmetric, no curvature. ROM normal. No CVA tenderness. Cardio: regular rate and rhythm, S1, S2 normal, no murmur, click, rub or gallop GI: soft, non-tender; bowel sounds normal; no masses,  no organomegaly Extremities: extremities normal, atraumatic, no cyanosis or edema  ECOG PERFORMANCE STATUS: 1 - Symptomatic but completely ambulatory  Blood pressure 102/88, pulse 73, temperature 98 F (36.7 C), temperature source Temporal, resp. rate 18, height 6' 2" (1.88 m), weight 118 lb 11.2 oz (53.8 kg), SpO2 100 %.  LABORATORY DATA: Lab Results  Component  Value Date   WBC 3.5 (L) 06/26/2019   HGB 9.7 (L) 06/26/2019   HCT 30.2 (L) 06/26/2019   MCV 78.2 (L) 06/26/2019   PLT 56 (L) 06/26/2019      Chemistry      Component Value Date/Time   NA 133 (L) 06/26/2019 0956   K 3.1 (L) 06/26/2019 0956   CL 90 (L) 06/26/2019 0956   CO2 30 06/26/2019 0956   BUN 8 06/26/2019 0956   CREATININE 0.71 06/26/2019 0956      Component Value Date/Time   CALCIUM 8.8 (L) 06/26/2019 0956   ALKPHOS 106 06/26/2019 0956   AST 17 06/26/2019 0956   ALT 11 06/26/2019 0956   BILITOT 0.6 06/26/2019 0956       RADIOGRAPHIC STUDIES: No results found.  ASSESSMENT AND PLAN: This is a very pleasant 58 years old African-American male recently diagnosed with a stage IIIA non-small cell lung cancer, adenocarcinoma.  He completed a course of concurrent  chemoradiation with weekly carboplatin and paclitaxel status post 7 cycles.   The patient tolerated this treatment well except for fatigue and odynophagia. Imaging studies after the course of concurrent chemoradiation unfortunately showed significant increase in the tumor burden involving the left lower lobe and also now right lower lobe. There was overlying groundglass attenuation and interlobular septal thickening concerning for lymphangitic spread of tumor.  The patient also has new bilateral pleural effusions right greater than left with additional small solid and sub-solid nodules involving the right upper lobe and left lung that were not significantly changed in the interval.  The scan also showed the left upper lobe and left lower lobe segmental branch acute pulmonary emboli. The patient is currently undergoing systemic chemotherapy with carboplatin, Alimta and Keytruda status post 2 cycles.  He has been tolerating the second cycle of this treatment much better. I recommended for the patient to proceed with cycle #3 today as planned if his blood work today is acceptable. I will see him back for follow-up visit in 3 weeks for evaluation with repeat CT scan of the chest, abdomen pelvis for restaging of his disease.  For the recent pulmonary embolism, the patient will continue his current treatment with Lovenox. The patient was advised to call immediately if he has any concerning symptoms in the interval. The patient voices understanding of current disease status and treatment options and is in agreement with the current care plan.  All questions were answered. The patient knows to call the clinic with any problems, questions or concerns. We can certainly see the patient much sooner if necessary.  Disclaimer: This note was dictated with voice recognition software. Similar sounding words can inadvertently be transcribed and may not be corrected upon review.

## 2019-07-03 NOTE — Patient Instructions (Signed)
[facility] Discharge Instructions for Patients Receiving Chemotherapy  Today you received the following chemotherapy agents: Pemetrexed (Alimta), Carboplatin, and Immunotherapy agent: Pembrolizumab (Keytruda)  To help prevent nausea and vomiting after your treatment, we encourage you to take your nausea medication as directed by your MD.   If you develop nausea and vomiting that is not controlled by your nausea medication, call the clinic.   BELOW ARE SYMPTOMS THAT SHOULD BE REPORTED IMMEDIATELY:  *FEVER GREATER THAN 100.5 F  *CHILLS WITH OR WITHOUT FEVER  NAUSEA AND VOMITING THAT IS NOT CONTROLLED WITH YOUR NAUSEA MEDICATION  *UNUSUAL SHORTNESS OF BREATH  *UNUSUAL BRUISING OR BLEEDING  TENDERNESS IN MOUTH AND THROAT WITH OR WITHOUT PRESENCE OF ULCERS  *URINARY PROBLEMS  *BOWEL PROBLEMS  UNUSUAL RASH Items with * indicate a potential emergency and should be followed up as soon as possible.  Feel free to call the clinic should you have any questions or concerns. The clinic phone number is [phone].  Please show the [facility] at check-in to the Emergency Department and triage nurse.  Coronavirus (COVID-19) Are you at risk?  Are you at risk for the Coronavirus (COVID-19)?  To be considered HIGH RISK for Coronavirus (COVID-19), you have to meet the following criteria:  . Traveled to [place], [place], [place], [place] or [place]; or in the [place] to [place], [place], [place], or [place]; and have fever, cough, and shortness of breath within the last 2 weeks of travel OR . Been in close contact with a person diagnosed with COVID-19 within the last 2 weeks and have fever, cough, and shortness of breath . IF YOU DO NOT MEET THESE CRITERIA, YOU ARE CONSIDERED LOW RISK FOR COVID-19.  What to do if you are HIGH RISK for COVID-19?  Marland Kitchen If you are having a medical emergency, call 911. . Seek medical care right away. Before you go to a doctor's  office, urgent care or emergency department, call ahead and tell them about your recent travel, contact with someone diagnosed with COVID-19, and your symptoms. You should receive instructions from your physician's office regarding next steps of care.  . When you arrive at healthcare provider, tell the healthcare staff immediately you have returned from visiting [place], [place], [place], [place] or [place]; or traveled in the [place] to [place], [place], [place], or [place]; in the last two weeks or you have been in close contact with a person diagnosed with COVID-19 in the last 2 weeks.   . Tell the health care staff about your symptoms: fever, cough and shortness of breath. . After you have been seen by a medical provider, you will be either: o Tested for (COVID-19) and discharged home on quarantine except to seek medical care if symptoms worsen, and asked to  - Stay home and avoid contact with others until you get your results (4-5 days)  - Avoid travel on public transportation if possible (such as bus, train, or airplane) or o Sent to the Emergency Department by EMS for evaluation, COVID-19 testing, and possible admission depending on your condition and test results.  What to do if you are LOW RISK for COVID-19?  Reduce your risk of any infection by using the same precautions used for avoiding the common cold or flu:  Marland Kitchen Wash your hands often with soap and warm water for at least 20 seconds.  If soap and water are not readily available, use an alcohol-based hand sanitizer with  at least 60% alcohol.  . If coughing or sneezing, cover your mouth and nose by coughing or sneezing into the elbow areas of your shirt or coat, into a tissue or into your sleeve (not your hands). . Avoid shaking hands with others and consider head nods or verbal greetings only. . Avoid touching your eyes, nose, or mouth with unwashed hands.  . Avoid close contact with people who are sick. . Avoid places or  events with large numbers of people in one location, like concerts or sporting events. . Carefully consider travel plans you have or are making. . If you are planning any travel outside or inside the [place], visit the CDC's Travelers' Health webpage for the latest health notices. . If you have some symptoms but not all symptoms, continue to monitor at home and seek medical attention if your symptoms worsen. . If you are having a medical emergency, call 911.   [place] / e-Visit: [email or web address]         [facility]: [facility]: [phone]                   [facility]: [phone]

## 2019-07-04 ENCOUNTER — Telehealth: Payer: Self-pay | Admitting: Internal Medicine

## 2019-07-04 NOTE — Telephone Encounter (Signed)
Scheduled per los. Called and left msg. Mailed printout  °

## 2019-07-10 ENCOUNTER — Inpatient Hospital Stay: Payer: Medicaid Other

## 2019-07-10 ENCOUNTER — Other Ambulatory Visit: Payer: Self-pay

## 2019-07-10 DIAGNOSIS — C3431 Malignant neoplasm of lower lobe, right bronchus or lung: Secondary | ICD-10-CM | POA: Diagnosis not present

## 2019-07-10 DIAGNOSIS — C3491 Malignant neoplasm of unspecified part of right bronchus or lung: Secondary | ICD-10-CM

## 2019-07-10 LAB — CBC WITH DIFFERENTIAL (CANCER CENTER ONLY)
Abs Immature Granulocytes: 0.04 10*3/uL (ref 0.00–0.07)
Basophils Absolute: 0 10*3/uL (ref 0.0–0.1)
Basophils Relative: 0 %
Eosinophils Absolute: 0 10*3/uL (ref 0.0–0.5)
Eosinophils Relative: 0 %
HCT: 27.7 % — ABNORMAL LOW (ref 39.0–52.0)
Hemoglobin: 8.8 g/dL — ABNORMAL LOW (ref 13.0–17.0)
Immature Granulocytes: 1 %
Lymphocytes Relative: 34 %
Lymphs Abs: 1.2 10*3/uL (ref 0.7–4.0)
MCH: 25.1 pg — ABNORMAL LOW (ref 26.0–34.0)
MCHC: 31.8 g/dL (ref 30.0–36.0)
MCV: 78.9 fL — ABNORMAL LOW (ref 80.0–100.0)
Monocytes Absolute: 0.2 10*3/uL (ref 0.1–1.0)
Monocytes Relative: 7 %
Neutro Abs: 1.9 10*3/uL (ref 1.7–7.7)
Neutrophils Relative %: 58 %
Platelet Count: 160 10*3/uL (ref 150–400)
RBC: 3.51 MIL/uL — ABNORMAL LOW (ref 4.22–5.81)
RDW: 17.7 % — ABNORMAL HIGH (ref 11.5–15.5)
WBC Count: 3.3 10*3/uL — ABNORMAL LOW (ref 4.0–10.5)
nRBC: 0 % (ref 0.0–0.2)

## 2019-07-10 LAB — CMP (CANCER CENTER ONLY)
ALT: 14 U/L (ref 0–44)
AST: 27 U/L (ref 15–41)
Albumin: 2.7 g/dL — ABNORMAL LOW (ref 3.5–5.0)
Alkaline Phosphatase: 116 U/L (ref 38–126)
Anion gap: 15 (ref 5–15)
BUN: 12 mg/dL (ref 6–20)
CO2: 30 mmol/L (ref 22–32)
Calcium: 8.7 mg/dL — ABNORMAL LOW (ref 8.9–10.3)
Chloride: 92 mmol/L — ABNORMAL LOW (ref 98–111)
Creatinine: 0.76 mg/dL (ref 0.61–1.24)
GFR, Est AFR Am: >60 mL/min (ref >60)
GFR, Estimated: >60 mL/min (ref >60)
Glucose, Bld: 127 mg/dL — ABNORMAL HIGH (ref 70–99)
Potassium: 3.8 mmol/L (ref 3.5–5.1)
Sodium: 137 mmol/L (ref 135–145)
Total Bilirubin: 0.6 mg/dL (ref 0.3–1.2)
Total Protein: 8 g/dL (ref 6.5–8.1)

## 2019-07-17 ENCOUNTER — Inpatient Hospital Stay: Payer: Medicaid Other

## 2019-07-17 ENCOUNTER — Inpatient Hospital Stay: Payer: Medicaid Other | Attending: Internal Medicine

## 2019-07-17 ENCOUNTER — Encounter: Payer: Self-pay | Admitting: Physician Assistant

## 2019-07-17 ENCOUNTER — Inpatient Hospital Stay (HOSPITAL_BASED_OUTPATIENT_CLINIC_OR_DEPARTMENT_OTHER): Payer: Medicaid Other | Admitting: Physician Assistant

## 2019-07-17 ENCOUNTER — Other Ambulatory Visit: Payer: Self-pay

## 2019-07-17 VITALS — BP 98/57 | HR 117 | Temp 98.3°F | Resp 16 | Ht 74.0 in | Wt 112.5 lb

## 2019-07-17 DIAGNOSIS — E440 Moderate protein-calorie malnutrition: Secondary | ICD-10-CM

## 2019-07-17 DIAGNOSIS — J90 Pleural effusion, not elsewhere classified: Secondary | ICD-10-CM | POA: Diagnosis not present

## 2019-07-17 DIAGNOSIS — D649 Anemia, unspecified: Secondary | ICD-10-CM | POA: Diagnosis not present

## 2019-07-17 DIAGNOSIS — Z9889 Other specified postprocedural states: Secondary | ICD-10-CM | POA: Diagnosis not present

## 2019-07-17 DIAGNOSIS — Z79899 Other long term (current) drug therapy: Secondary | ICD-10-CM | POA: Diagnosis not present

## 2019-07-17 DIAGNOSIS — R531 Weakness: Secondary | ICD-10-CM | POA: Diagnosis not present

## 2019-07-17 DIAGNOSIS — E876 Hypokalemia: Secondary | ICD-10-CM | POA: Diagnosis not present

## 2019-07-17 DIAGNOSIS — Z5111 Encounter for antineoplastic chemotherapy: Secondary | ICD-10-CM | POA: Diagnosis present

## 2019-07-17 DIAGNOSIS — C3491 Malignant neoplasm of unspecified part of right bronchus or lung: Secondary | ICD-10-CM | POA: Diagnosis not present

## 2019-07-17 DIAGNOSIS — F17210 Nicotine dependence, cigarettes, uncomplicated: Secondary | ICD-10-CM | POA: Diagnosis not present

## 2019-07-17 DIAGNOSIS — E860 Dehydration: Secondary | ICD-10-CM

## 2019-07-17 DIAGNOSIS — R000 Tachycardia, unspecified: Secondary | ICD-10-CM

## 2019-07-17 DIAGNOSIS — E46 Unspecified protein-calorie malnutrition: Secondary | ICD-10-CM | POA: Insufficient documentation

## 2019-07-17 DIAGNOSIS — Z5112 Encounter for antineoplastic immunotherapy: Secondary | ICD-10-CM | POA: Diagnosis present

## 2019-07-17 DIAGNOSIS — Z8249 Family history of ischemic heart disease and other diseases of the circulatory system: Secondary | ICD-10-CM | POA: Diagnosis not present

## 2019-07-17 DIAGNOSIS — R42 Dizziness and giddiness: Secondary | ICD-10-CM | POA: Insufficient documentation

## 2019-07-17 DIAGNOSIS — J449 Chronic obstructive pulmonary disease, unspecified: Secondary | ICD-10-CM | POA: Diagnosis not present

## 2019-07-17 DIAGNOSIS — I959 Hypotension, unspecified: Secondary | ICD-10-CM | POA: Diagnosis not present

## 2019-07-17 DIAGNOSIS — D6481 Anemia due to antineoplastic chemotherapy: Secondary | ICD-10-CM | POA: Insufficient documentation

## 2019-07-17 DIAGNOSIS — C3431 Malignant neoplasm of lower lobe, right bronchus or lung: Secondary | ICD-10-CM | POA: Insufficient documentation

## 2019-07-17 DIAGNOSIS — R634 Abnormal weight loss: Secondary | ICD-10-CM | POA: Insufficient documentation

## 2019-07-17 DIAGNOSIS — R05 Cough: Secondary | ICD-10-CM | POA: Diagnosis not present

## 2019-07-17 DIAGNOSIS — Z7901 Long term (current) use of anticoagulants: Secondary | ICD-10-CM | POA: Diagnosis not present

## 2019-07-17 DIAGNOSIS — Z86711 Personal history of pulmonary embolism: Secondary | ICD-10-CM | POA: Diagnosis not present

## 2019-07-17 DIAGNOSIS — R1310 Dysphagia, unspecified: Secondary | ICD-10-CM | POA: Insufficient documentation

## 2019-07-17 DIAGNOSIS — I480 Paroxysmal atrial fibrillation: Secondary | ICD-10-CM | POA: Insufficient documentation

## 2019-07-17 DIAGNOSIS — R197 Diarrhea, unspecified: Secondary | ICD-10-CM | POA: Diagnosis not present

## 2019-07-17 LAB — CBC WITH DIFFERENTIAL (CANCER CENTER ONLY)
Abs Immature Granulocytes: 0 10*3/uL (ref 0.00–0.07)
Basophils Absolute: 0 10*3/uL (ref 0.0–0.1)
Basophils Relative: 0 %
Eosinophils Absolute: 0.1 10*3/uL (ref 0.0–0.5)
Eosinophils Relative: 3 %
HCT: 23.7 % — ABNORMAL LOW (ref 39.0–52.0)
Hemoglobin: 7.7 g/dL — ABNORMAL LOW (ref 13.0–17.0)
Immature Granulocytes: 0 %
Lymphocytes Relative: 37 %
Lymphs Abs: 0.8 10*3/uL (ref 0.7–4.0)
MCH: 25.2 pg — ABNORMAL LOW (ref 26.0–34.0)
MCHC: 32.5 g/dL (ref 30.0–36.0)
MCV: 77.7 fL — ABNORMAL LOW (ref 80.0–100.0)
Monocytes Absolute: 0.8 10*3/uL (ref 0.1–1.0)
Monocytes Relative: 32 %
Neutro Abs: 0.7 10*3/uL — ABNORMAL LOW (ref 1.7–7.7)
Neutrophils Relative %: 28 %
Platelet Count: 38 10*3/uL — ABNORMAL LOW (ref 150–400)
RBC: 3.05 MIL/uL — ABNORMAL LOW (ref 4.22–5.81)
RDW: 17.2 % — ABNORMAL HIGH (ref 11.5–15.5)
WBC Count: 2.3 10*3/uL — ABNORMAL LOW (ref 4.0–10.5)
nRBC: 0 % (ref 0.0–0.2)

## 2019-07-17 LAB — CMP (CANCER CENTER ONLY)
ALT: 8 U/L (ref 0–44)
AST: 17 U/L (ref 15–41)
Albumin: 2.7 g/dL — ABNORMAL LOW (ref 3.5–5.0)
Alkaline Phosphatase: 116 U/L (ref 38–126)
Anion gap: 12 (ref 5–15)
BUN: 12 mg/dL (ref 6–20)
CO2: 31 mmol/L (ref 22–32)
Calcium: 8.5 mg/dL — ABNORMAL LOW (ref 8.9–10.3)
Chloride: 90 mmol/L — ABNORMAL LOW (ref 98–111)
Creatinine: 0.85 mg/dL (ref 0.61–1.24)
GFR, Est AFR Am: >60 mL/min (ref >60)
GFR, Estimated: >60 mL/min (ref >60)
Glucose, Bld: 124 mg/dL — ABNORMAL HIGH (ref 70–99)
Potassium: 3.5 mmol/L (ref 3.5–5.1)
Sodium: 133 mmol/L — ABNORMAL LOW (ref 135–145)
Total Bilirubin: 0.5 mg/dL (ref 0.3–1.2)
Total Protein: 8 g/dL (ref 6.5–8.1)

## 2019-07-17 LAB — TSH: TSH: 1.775 u[IU]/mL (ref 0.320–4.118)

## 2019-07-17 LAB — PREPARE RBC (CROSSMATCH)

## 2019-07-17 MED ORDER — SODIUM CHLORIDE 0.9 % IV SOLN
Freq: Once | INTRAVENOUS | Status: DC
  Filled 2019-07-17: qty 250

## 2019-07-17 MED ORDER — ACETAMINOPHEN 325 MG PO TABS
650.0000 mg | ORAL_TABLET | Freq: Once | ORAL | Status: AC
  Administered 2019-07-17: 650 mg via ORAL

## 2019-07-17 MED ORDER — ACETAMINOPHEN 325 MG PO TABS
ORAL_TABLET | ORAL | Status: AC
  Filled 2019-07-17: qty 2

## 2019-07-17 MED ORDER — DIPHENHYDRAMINE HCL 25 MG PO CAPS
25.0000 mg | ORAL_CAPSULE | Freq: Once | ORAL | Status: AC
  Administered 2019-07-17: 25 mg via ORAL

## 2019-07-17 MED ORDER — DIPHENHYDRAMINE HCL 25 MG PO CAPS
ORAL_CAPSULE | ORAL | Status: AC
  Filled 2019-07-17: qty 1

## 2019-07-17 MED ORDER — SODIUM CHLORIDE 0.9% IV SOLUTION
250.0000 mL | Freq: Once | INTRAVENOUS | Status: AC
  Administered 2019-07-17: 250 mL via INTRAVENOUS
  Filled 2019-07-17: qty 250

## 2019-07-17 NOTE — Progress Notes (Signed)
[facility] OFFICE PROGRESS NOTE  Brand Males, MD [place] [address]  DIAGNOSIS: Stage IV(T1b, N2, M1a) non-small cell lung cancer, adenocarcinoma diagnosed in July 2020.He presented with right lower lobe pulmonary nodule in addition to subcarinal lymphadenopathyas well as bilateral pulmonary nodules.He was initially treated as a stage IIIa with concurrent chemoradiation but has evidence for disease progression after this course.  Molecular studies byGuardant 360 [id number] NArsenio Katz Rucaparib, Talazoparib Yes 0.4% [id number] (VUS) No (VUS) 1.7% FGFR1 A343A None (Synonymous)No (Synonymous) 0.1%  PRIOR THERAPY: A course of concurrent chemoradiation with weekly carboplatin for AUC of 2 and paclitaxel 45 mg/M2. Status post 7 cycles. Last dose was given on March 12, 2019. Unfortunately patient has evidence for disease progression after this treatment.  CURRENT THERAPY: Systemic chemotherapy with carboplatin for AUC of 5, Alimta 500 mg/M2 and Keytruda 200 mg IV every 3 weeks. First dose May 22, 2019.Status post 3 cycles  INTERVAL HISTORY: RDEMIAN MAISEL59y.o. male returns to the clinic for follow-up visit.  The patient is being seen today for an acute visit due to worsening dyspnea on exertion, generalized weakness, fatigue, feeling cold constantly, and lightheadedness/dizziness.  The patient states that his symptoms have gradually worsened over the course of the last few days, but it was particularly noticeable yesterday when he stood up to use the restroom and he experienced shortness of breath and lightheadedness upon standing.  The patient denies any other associated symptoms.  He denies any recent fevers or signs and symptoms of infection including sore throat, fevers,  worsening cough, diarrhea, skin infections, or dysuria.  He has a baseline nonproductive cough which is worse at night for  which he takes cough medicine for.  He denies any leg swelling or chest pain.  He denies any nausea, vomiting, diarrhea, or constipation.  He continues to be cachectic and spends most of his day in bed.  He lost an additional 6 pounds since his last appointment 2 weeks ago.  He states that on a given day he typically eats peanut butter and jelly, coffee, a boiled egg, V8 juice, 1 bottle of water, and two ensures.  He denies any night sweats.  He is here today for evaluation and repeat blood work.  MEDICAL HISTORY: Past Medical History:  Diagnosis Date  . Alcohol abuse   . Chronic back pain   . [place]   . Dyspnea   . Lung mass    mediastinal adenopathy  . Lung nodule   . Multiple pulmonary nodules 04/02/2018  . Pneumonia   . Right lower lobe pulmonary nodule 04/02/2018   12/01/2017: PET SUV 2.1  . Tobacco abuse     ALLERGIES:  has No Known Allergies.  MEDICATIONS:  Current Outpatient Medications  Medication Sig Dispense Refill  . albuterol (PROVENTIL HFA;VENTOLIN HFA) 108 (90 Base) MCG/ACT inhaler Inhale 2 puffs into the lungs every 6 (six) hours as needed for wheezing or shortness of breath. 1 Inhaler 3  . albuterol (PROVENTIL) (2.5 MG/3ML) 0.083% nebulizer solution Take 2.5 mg by nebulization every 6 (six) hours as needed.    . feeding supplement, ENSURE ENLIVE, (ENSURE ENLIVE) LIQD Take 237 mLs by mouth 3 (three) times daily between meals. (Patient taking differently: Take 237 mLs by mouth daily. ) 90 Bottle 0  . folic acid (FOLVITE) 1 MG tablet Take 1 tablet (1 mg total) by mouth daily. 30 tablet 4  . lidocaine (XYLOCAINE) 2 %  solution 5 ml swallow before meals for radiation esophagitis (Patient not taking: Reported on 06/12/2019) 200 mL 0  . magnesium oxide (MAG-OX) 400 (241.3 Mg) MG tablet Take 1 tablet (400 mg total) by mouth 2 (two) times daily. 60 tablet 0  . metoprolol tartrate (LOPRESSOR) 25 MG tablet Take 1 tablet (25 mg total) by  mouth 2 (two) times daily.    . Morphine Sulfate (MORPHINE CONCENTRATE) 10 mg / 0.5 ml concentrated solution Take 0.5 mLs by mouth every 4 (four) hours as needed.    Marland Kitchen oxyCODONE-acetaminophen (PERCOCET/ROXICET) 5-325 MG tablet Take 1 tablet by mouth every 8 (eight) hours as needed for severe pain. 30 tablet 0  . polyethylene glycol (MIRALAX / GLYCOLAX) 17 g packet Take 17 g by mouth daily.    . potassium chloride SA (KLOR-CON) 20 MEQ tablet Take 1 tablet (20 mEq total) by mouth 2 (two) times daily. (Patient taking differently: Take 20 mEq by mouth daily. ) 14 tablet 0  . prochlorperazine (COMPAZINE) 10 MG tablet Take 1 tablet (10 mg total) by mouth every 6 (six) hours as needed for nausea or vomiting. 30 tablet 0  . Rivaroxaban 15 & 20 MG TBPK Follow package directions: Take one 72m tablet by mouth twice a day. On day 22, switch to one 249mtablet once a day. Take with food. 51 each 0  . SENNA-PLUS 8.6-50 MG tablet Take 1-2 tablets by mouth daily. 30 tablet 2  . sucralfate (CARAFATE) 1 g tablet Take 1 g by mouth 4 (four) times daily -  with meals and at bedtime.    . Marland Kitcheniotropium (SPIRIVA HANDIHALER) 18 MCG inhalation capsule Place 1 capsule (18 mcg total) into inhaler and inhale daily. 90 capsule 3  . umeclidinium bromide (INCRUSE ELLIPTA) 62.5 MCG/INH AEPB Inhale 1 puff into the lungs daily.     No current facility-administered medications for this visit.    SURGICAL HISTORY:  Past Surgical History:  Procedure Laterality Date  . APPENDECTOMY    . VIDEO BRONCHOSCOPY WITH ENDOBRONCHIAL NAVIGATION N/A 04/05/2018   Procedure: VIDEO BRONCHOSCOPY WITH ENDOBRONCHIAL NAVIGATION;  Surgeon: IcGarner NashDO;  [facility];  Laterality: N/A;  . VIDEO BRONCHOSCOPY WITH ENDOBRONCHIAL NAVIGATION N/A 01/11/2019   Procedure: VIDEO BRONCHOSCOPY WITH ENDOBRONCHIAL NAVIGATION;  Surgeon: IcGarner NashDO;  [facility];  Laterality: N/A;  . VIDEO BRONCHOSCOPY  WITH ENDOBRONCHIAL ULTRASOUND N/A 01/11/2019   Procedure: VIDEO BRONCHOSCOPY WITH ENDOBRONCHIAL ULTRASOUND;  Surgeon: IcGarner NashDO;  [facility];  Laterality: N/A;  . VIDEO BRONCHOSCOPY WITH RADIAL ENDOBRONCHIAL ULTRASOUND N/A 01/11/2019   Procedure: VIDEO BRONCHOSCOPY WITH RADIAL ENDOBRONCHIAL ULTRASOUND;  Surgeon: IcGarner NashDO;  [facility];  Laterality: N/A;  . WISDOM TOOTH EXTRACTION      REVIEW OF SYSTEMS:   Review of Systems  Constitutional: Positive for generalized weakness, decreased appetite, fatigue, and weight loss. Negative for fever. HENT: Negative for mouth sores, nosebleeds, sore throat and trouble swallowing.   Eyes: Negative for eye problems and icterus.  Respiratory: Positive for dyspnea on exertion and unchanged non-productive cough. Negative for hemoptysis and wheezing.   Cardiovascular: Negative for chest pain and leg swelling.  Gastrointestinal: Negative for abdominal pain, constipation, diarrhea, nausea and vomiting.  Genitourinary: Negative for bladder incontinence, difficulty urinating, dysuria, frequency and hematuria.   Musculoskeletal: Negative for back pain, gait problem, neck pain and neck stiffness.  Skin: Negative for itching and rash.  Neurological: Positive for light headedness. Negative for dizziness, extremity weakness, gait problem, headaches, and seizures.  Hematological: Negative for adenopathy. Does not bruise/bleed easily.  Psychiatric/Behavioral: Negative for confusion, depression and sleep disturbance. The patient is not nervous/anxious.     PHYSICAL EXAMINATION:  Blood pressure (!) 98/57, pulse (!) 117, temperature 98.3 F (36.8 C), temperature source Temporal, resp. rate 16, height 6' 2"  (1.88 m), weight 112 lb 8 oz (51 kg), SpO2 100 %.  ECOG PERFORMANCE STATUS: 3 - Symptomatic, >50% confined to bed  Physical Exam  Constitutional: Oriented to person, place, and time and cachetic  appearing male and in no distress.  HENT:  Head: Normocephalic and atraumatic.  Mouth/Throat: Oropharynx is clear and moist. No oropharyngeal exudate.  Eyes: Conjunctivae are normal. Right eye exhibits no discharge. Left eye exhibits no discharge. No scleral icterus.  Neck: Normal range of motion. Neck supple.  Cardiovascular: Tachycardic, regular rhythm, normal heart sounds and intact distal pulses.   Pulmonary/Chest: Effort normal. Decreased breath sounds in the right lower lobe. No respiratory distress. No wheezes. No rales.  Abdominal: Soft. Bowel sounds are normal. Exhibits no distension and no mass. There is no tenderness.  Musculoskeletal: Normal range of motion. Exhibits no edema.  Lymphadenopathy:    No cervical adenopathy.  Neurological: Alert and oriented to person, place, and time. Exhibits normal muscle tone. Gait normal. Coordination normal.  Skin: Skin is warm and dry. No rash noted. Not diaphoretic. No erythema. No pallor.  Psychiatric: Mood, memory and judgment normal.  Vitals reviewed.  LABORATORY DATA: Lab Results  Component Value Date   WBC 2.3 (L) 07/17/2019   HGB 7.7 (L) 07/17/2019   HCT 23.7 (L) 07/17/2019   MCV 77.7 (L) 07/17/2019   PLT 38 (L) 07/17/2019      Chemistry      Component Value Date/Time   NA 133 (L) 07/17/2019 1059   K 3.5 07/17/2019 1059   CL 90 (L) 07/17/2019 1059   CO2 31 07/17/2019 1059   BUN 12 07/17/2019 1059   CREATININE 0.85 07/17/2019 1059      Component Value Date/Time   CALCIUM 8.5 (L) 07/17/2019 1059   ALKPHOS 116 07/17/2019 1059   AST 17 07/17/2019 1059   ALT 8 07/17/2019 1059   BILITOT 0.5 07/17/2019 1059       RADIOGRAPHIC STUDIES:  No results found.   ASSESSMENT/PLAN:  This is a very pleasant 59 year old African-American male initially diagnosed with stage IIIa non-small cell lung cancer, adenocarcinoma in July 2020. He presented with a right lower lobe pulmonary nodule in addition to subcarinal lymphadenopathy  as well as bilateral pulmonary nodules. He was initially treated with weekly concurrent chemoradiation with carboplatin for an AUC of 2 and paclitaxel 45 mg/m. He had evidence of disease progression after this course withsignificant increase in the tumor burden involving the left lower lobe and also now right lower lobe. There was overlying groundglass attenuation and interlobular septal thickening concerning for lymphangitic spread of tumor. The patient also has new bilateral pleural effusions right greater than left with additional small solid and sub-solid nodules involving the right upper lobe and left lung that were not significantly changed in the interval. The scan also showed the left upper lobe and left lower lobe segmental branch acute pulmonary emboli. He has no actionable mutations.   Patient recently started palliative systemic chemotherapy with carboplatin for an AUC of 5, Alimta 500 mg/m and Keytruda 200 mg IV every 3 weeks. He is status post his 3rd cycle  of treatment. He is tolerating his treatment fairly well but the patient has a poor performance status and is experiencing significant fatigue, weakness, and weight loss.   The patient was seen with Dr. Julien Nordmann today. Labs were reviewed. The patient is neutropenic with an ANC of 0.7, neutropenic precautions were discussed with the patient today. Additionally, his platelet count was 38k today. Patient denies any bleeding at this time. He was supposed to be taking Xarelto after a recent PE, but he states that he "ran out". Due to his thrombocytopenia and bleeding risk, he will not restart his xarleto until his platelets >50k. We will recheck his platelets next week and will send 20 mg p.o. daily of Xarelto to his pharmacy. He denies any chest pain at this time, he is breathing comfortably, and his oxygen saturation is 100%. I dicussed the importance of notifying us if he is in need of a refill of any of his prescriptions and the  importance of taking his medications, particularly Xarelto, as prescribed.   His hemoglobin is 7.7 today which is likely causing his symptoms of chills, lightheadedness/dizziness, fatigue, and generalized weakness. We will arrange for him to receive 1 L of fluid while in the clinic as well as 2 units of RBCs. He will receive 1 unit today and one tomorrow.   The patient is scheduled for a restaging CT scan next week. Due to the patient's poor performance status, we may be discussing goals of care at his follow up visit next week depending on the results of his restaging CT scan.   We will see him back for a follow up visit in 1 week for evaluation and to review his scan results before starting cycle #4.   The patient was advised to call immediately if he has any concerning symptoms in the interval. The patient voices understanding of current disease status and treatment options and is in agreement with the current care plan. All questions were answered. The patient knows to call the clinic with any problems, questions or concerns. We can certainly see the patient much sooner if necessary   Orders Placed This Encounter  Procedures  . Practitioner attestation of consent    I, the ordering practitioner, attest that I have discussed with the patient the benefits, risks, side effects, alternatives, likelihood of achieving goals and potential problems during recovery for the procedure listed.    Standing Status:   Future    Standing Expiration Date:   07/16/2020    Order Specific Question:   Procedure    Answer:   Blood Product(s)  . Complete patient signature process for consent form    Standing Status:   Future    Standing Expiration Date:   07/16/2020  . Care order/instruction    Transfuse Parameters    Standing Status:   Future    Standing Expiration Date:   07/16/2020  . Type and screen    Standing Status:   Future    Number of Occurrences:   1    Standing Expiration Date:   07/16/2020  .  Prepare RBC    Standing Status:   Standing    Number of Occurrences:   1    Order Specific Question:   # of Units    Answer:   1 unit    Order Specific Question:   Transfusion Indications    Answer:   Symptomatic Anemia    Order Specific Question:   If emergent release call blood bank  Answer:   Not emergent release     Amando Chaput L Ayoub Arey, PA-C 07/17/19  ADDENDUM: Hematology/Oncology Attending: I had a face-to-face encounter with the patient today.  I recommended his care plan.  This is a very pleasant 59 years old African-American male with metastatic non-small cell lung cancer, adenocarcinoma that was initially diagnosed as a stage IIIA status post course of concurrent chemoradiation but unfortunately the patient had evidence for metastatic disease after completion of this induction treatment. He is currently undergoing systemic chemotherapy with carboplatin, Alimta and Keytruda status post 3 cycles.  He has been tolerating the treatment well except for increasing fatigue and weakness as well as dehydration. He presented to the clinic today for routine blood work and was complaining of increasing fatigue and weakness as well as dehydration.  He was seen for evaluation and management of his condition.  The patient was found to have significant anemia in addition to dehydration and lack of appetite. We will arrange for the patient to receive IV hydration today. We will also arrange for the patient to receive 2 units of PRBCs transfusion for his chemotherapy-induced anemia. The patient is scheduled to have repeat CT scan of the chest, abdomen pelvis next week for restaging of his disease.  He will come back for follow-up visit after the scan for further evaluation and recommendation regarding further treatment. He was advised to call immediately if he has any other concerning symptoms in the interval.  Disclaimer: This note was dictated with voice recognition software. Similar  sounding words can inadvertently be transcribed and may be missed upon review. Eilleen Kempf, MD 07/17/19

## 2019-07-17 NOTE — Patient Instructions (Signed)
Blood Transfusion, Adult, Care After This sheet gives you information about how to care for yourself after your procedure. Your doctor may also give you more specific instructions. If you have problems or questions, contact your doctor. What can I expect after the procedure? After the procedure, it is common to have:  Bruising and soreness at the IV site.  A fever or chills on the day of the procedure. This may be your body's response to the new blood cells received.  A headache. Follow these instructions at home: Insertion site care      Follow instructions from your doctor about how to take care of your insertion site. This is where an IV tube was put into your vein. Make sure you: ? Wash your hands with soap and water before and after you change your bandage (dressing). If you cannot use soap and water, use hand sanitizer. ? Change your bandage as told by your doctor.  Check your insertion site every day for signs of infection. Check for: ? Redness, swelling, or pain. ? Bleeding from the site. ? Warmth. ? Pus or a bad smell. General instructions  Take over-the-counter and prescription medicines only as told by your doctor.  Rest as told by your doctor.  Go back to your normal activities as told by your doctor.  Keep all follow-up visits as told by your doctor. This is important. Contact a doctor if:  You have itching or red, swollen areas of skin (hives).  You feel worried or nervous (anxious).  You feel weak after doing your normal activities.  You have redness, swelling, warmth, or pain around the insertion site.  You have blood coming from the insertion site, and the blood does not stop with pressure.  You have pus or a bad smell coming from the insertion site. Get help right away if:  You have signs of a serious reaction. This may be coming from an allergy or the body's defense system (immune system). Signs include: ? Trouble breathing or shortness of  breath. ? Swelling of the face or feeling warm (flushed). ? Fever or chills. ? Head, chest, or back pain. ? Dark pee (urine) or blood in the pee. ? Widespread rash. ? Fast heartbeat. ? Feeling dizzy or light-headed. You may receive your blood transfusion in an outpatient setting. If so, you will be told whom to contact to report any reactions. These symptoms may be an emergency. Do not wait to see if the symptoms will go away. Get medical help right away. Call your local emergency services (911 in the [place] Do not drive yourself to the hospital. Summary  Bruising and soreness at the IV site are common.  Check your insertion site every day for signs of infection.  Rest as told by your doctor. Go back to your normal activities as told by your doctor.  Get help right away if you have signs of a serious reaction. This information is not intended to replace advice given to you by your health care provider. Make sure you discuss any questions you have with your health care provider. Document Revised: 12/21/2018 Document Reviewed: 12/21/2018 Elsevier Patient Education  [place].   Rehydration, Adult Rehydration is the replacement of body fluids and salts and minerals (electrolytes) that are lost during dehydration. Dehydration is when there is not enough fluid or water in the body. This happens when you lose more fluids than you take in. Common causes of dehydration include:  Vomiting.  Diarrhea.  Excessive  sweating, such as from heat exposure or exercise.  Taking medicines that cause the body to lose excess fluid (diuretics).  Impaired kidney function.  Not drinking enough fluid.  Certain illnesses or infections.  Certain poorly controlled long-term (chronic) illnesses, such as diabetes, heart disease, and kidney disease.  Symptoms of mild dehydration may include thirst, dry lips and mouth, dry skin, and dizziness. Symptoms of severe dehydration may include increased  heart rate, confusion, fainting, and not urinating. You can rehydrate by drinking certain fluids or getting fluids through an IV tube, as told by your health care provider. What are the risks? Generally, rehydration is safe. However, one problem that can happen is taking in too much fluid (overhydration). This is rare. If overhydration happens, it can cause an electrolyte imbalance, kidney failure, or a decrease in salt (sodium) levels in the body. How to rehydrate Follow instructions from your health care provider for rehydration. The kind of fluid you should drink and the amount you should drink depend on your condition.  If directed by your health care provider, drink an oral rehydration solution (ORS). This is a drink designed to treat dehydration that is found in pharmacies and retail stores. ? Make an ORS by following instructions on the package. ? Start by drinking small amounts, about  cup (120 mL) every 5-10 minutes. ? Slowly increase how much you drink until you have taken the amount recommended by your health care provider.  Drink enough clear fluids to keep your urine clear or pale yellow. If you were instructed to drink an ORS, finish the ORS first, then start slowly drinking other clear fluids. Drink fluids such as: ? Water. Do not drink only water. Doing that can lead to having too little sodium in your body (hyponatremia). ? Ice chips. ? Fruit juice that you have added water to (diluted juice). ? Low-calorie sports drinks.  If you are severely dehydrated, your health care provider may recommend that you receive fluids through an IV tube in the hospital.  Do not take sodium tablets. Doing that can lead to the condition of having too much sodium in your body (hypernatremia). Eating while you rehydrate Follow instructions from your health care provider about what to eat while you rehydrate. Your health care provider may recommend that you slowly begin eating regular foods in small  amounts.  Eat foods that contain a healthy balance of electrolytes, such as bananas, oranges, potatoes, tomatoes, and spinach.  Avoid foods that are greasy or contain a lot of fat or sugar.  In some cases, you may get nutrition through a feeding tube that is passed through your nose and into your stomach (nasogastric tube, or NG tube). This may be done if you have uncontrolled vomiting or diarrhea. Beverages to avoid Certain beverages may make dehydration worse. While you rehydrate, avoid:  Alcohol.  Caffeine.  Drinks that contain a lot of sugar. These include: ? High-calorie sports drinks. ? Fruit juice that is not diluted. ? Soda.  Check nutrition labels to see how much sugar or caffeine a beverage contains. Signs of dehydration recovery You may be recovering from dehydration if:  You are urinating more often than before you started rehydrating.  Your urine is clear or pale yellow.  Your energy level improves.  You vomit less frequently.  You have diarrhea less frequently.  Your appetite improves or returns to normal.  You feel less dizzy or less light-headed.  Your skin tone and color start to look more normal. Contact  a health care provider if:  You continue to have symptoms of mild dehydration, such as: ? Thirst. ? Dry lips. ? Slightly dry mouth. ? Dry, warm skin. ? Dizziness.  You continue to vomit or have diarrhea. Get help right away if:  You have symptoms of dehydration that get worse.  You feel: ? Confused. ? Weak. ? Like you are going to faint.  You have not urinated in 6-8 hours.  You have very dark urine.  You have trouble breathing.  Your heart rate while sitting still is over 100 beats a minute.  You cannot drink fluids without vomiting.  You have vomiting or diarrhea that: ? Gets worse. ? Does not go away.  You have a fever. This information is not intended to replace advice given to you by your health care provider. Make sure  you discuss any questions you have with your health care provider. Document Revised: 06/10/2017 Document Reviewed: 08/22/2015 Elsevier Patient Education  2020 [place] (COVID-19) Are you at risk?  Are you at risk for the Coronavirus (COVID-19)?  To be considered HIGH RISK for Coronavirus (COVID-19), you have to meet the following criteria:  . Traveled to [place], [place], [place], [place] or [place]; or in the [place] to [place], [place], [place], or [place]; and have fever, cough, and shortness of breath within the last 2 weeks of travel OR . Been in close contact with a person diagnosed with COVID-19 within the last 2 weeks and have fever, cough, and shortness of breath . IF YOU DO NOT MEET THESE CRITERIA, YOU ARE CONSIDERED LOW RISK FOR COVID-19.  What to do if you are HIGH RISK for COVID-19?  Marland Kitchen If you are having a medical emergency, call 911. . Seek medical care right away. Before you go to a doctor's office, urgent care or emergency department, call ahead and tell them about your recent travel, contact with someone diagnosed with COVID-19, and your symptoms. You should receive instructions from your physician's office regarding next steps of care.  . When you arrive at healthcare provider, tell the healthcare staff immediately you have returned from visiting [place], [place], [place], [place] or [place]; or traveled in the [place] to [place], [place], [place], or [place]; in the last two weeks or you have been in close contact with a person diagnosed with COVID-19 in the last 2 weeks.   . Tell the health care staff about your symptoms: fever, cough and shortness of breath. . After you have been seen by a medical provider, you will be either: o Tested for (COVID-19) and discharged home on quarantine except to seek medical care if symptoms worsen, and asked to  - Stay home and avoid contact with others until you get your results (4-5 days)  - Avoid  travel on public transportation if possible (such as bus, train, or airplane) or o Sent to the Emergency Department by EMS for evaluation, COVID-19 testing, and possible admission depending on your condition and test results.  What to do if you are LOW RISK for COVID-19?  Reduce your risk of any infection by using the same precautions used for avoiding the common cold or flu:  Marland Kitchen Wash your hands often with soap and warm water for at least 20 seconds.  If soap and water are not readily available, use an alcohol-based hand sanitizer with at least 60% alcohol.  . If coughing or sneezing, cover your mouth and nose by coughing  or sneezing into the elbow areas of your shirt or coat, into a tissue or into your sleeve (not your hands). . Avoid shaking hands with others and consider head nods or verbal greetings only. . Avoid touching your eyes, nose, or mouth with unwashed hands.  . Avoid close contact with people who are sick. . Avoid places or events with large numbers of people in one location, like concerts or sporting events. . Carefully consider travel plans you have or are making. . If you are planning any travel outside or inside the [place], visit the CDC's Travelers' Health webpage for the latest health notices. . If you have some symptoms but not all symptoms, continue to monitor at home and seek medical attention if your symptoms worsen. . If you are having a medical emergency, call 911.   [place] / e-Visit: [email or web address]         [facility]: [facility]: [phone]                   [facility]: [phone]

## 2019-07-18 LAB — TYPE AND SCREEN
ABO/RH(D): B POS
Antibody Screen: NEGATIVE
Unit division: 0

## 2019-07-18 LAB — BPAM RBC
Blood Product Expiration Date: 202101242359
ISSUE DATE / TIME: 202101051435
Unit Type and Rh: 7300

## 2019-07-23 ENCOUNTER — Other Ambulatory Visit: Payer: Self-pay

## 2019-07-23 ENCOUNTER — Telehealth: Payer: Self-pay | Admitting: Physician Assistant

## 2019-07-23 ENCOUNTER — Ambulatory Visit (HOSPITAL_COMMUNITY)
Admission: RE | Admit: 2019-07-23 | Discharge: 2019-07-23 | Disposition: A | Discharge status: Home / Self Care | Payer: Medicaid Other | Source: Ambulatory Visit | Attending: Internal Medicine | Admitting: Internal Medicine

## 2019-07-23 ENCOUNTER — Other Ambulatory Visit: Payer: Self-pay | Admitting: Physician Assistant

## 2019-07-23 DIAGNOSIS — C3490 Malignant neoplasm of unspecified part of unspecified bronchus or lung: Secondary | ICD-10-CM | POA: Diagnosis not present

## 2019-07-23 DIAGNOSIS — C3431 Malignant neoplasm of lower lobe, right bronchus or lung: Secondary | ICD-10-CM | POA: Diagnosis not present

## 2019-07-23 DIAGNOSIS — N12 Tubulo-interstitial nephritis, not specified as acute or chronic: Secondary | ICD-10-CM

## 2019-07-23 DIAGNOSIS — R188 Other ascites: Secondary | ICD-10-CM | POA: Diagnosis not present

## 2019-07-23 MED ORDER — SODIUM CHLORIDE (PF) 0.9 % IJ SOLN
INTRAMUSCULAR | Status: AC
  Filled 2019-07-23: qty 50

## 2019-07-23 MED ORDER — IOHEXOL 300 MG/ML  SOLN
75.0000 mL | Freq: Once | INTRAMUSCULAR | Status: AC | PRN
  Administered 2019-07-23: 75 mL via INTRAVENOUS

## 2019-07-23 MED ORDER — CIPROFLOXACIN HCL 500 MG PO TABS: 500.0000 mg | ORAL_TABLET | Freq: Two times a day (BID) | ORAL | 0 refills | Status: DC

## 2019-07-23 NOTE — Telephone Encounter (Signed)
Spoke to the patient about his CT scan which noted possible pyelonephritis. The patient denied any hematuria, fevers, back pain, dysuria, or cloudy urine. Sent a prescription for 500 mg BID of cipro to his pharmacy and instructed him to start taking it. We will get a UA and culture when he returns to the clinic tomorrow.

## 2019-07-24 ENCOUNTER — Inpatient Hospital Stay: Payer: Medicaid Other

## 2019-07-24 ENCOUNTER — Encounter: Payer: Self-pay | Admitting: Internal Medicine

## 2019-07-24 ENCOUNTER — Other Ambulatory Visit: Payer: Self-pay

## 2019-07-24 ENCOUNTER — Other Ambulatory Visit: Payer: Self-pay | Admitting: Physician Assistant

## 2019-07-24 ENCOUNTER — Encounter: Payer: Self-pay | Admitting: *Deleted

## 2019-07-24 ENCOUNTER — Inpatient Hospital Stay (HOSPITAL_BASED_OUTPATIENT_CLINIC_OR_DEPARTMENT_OTHER): Payer: Medicaid Other | Admitting: Internal Medicine

## 2019-07-24 ENCOUNTER — Telehealth: Payer: Self-pay | Admitting: *Deleted

## 2019-07-24 VITALS — BP 102/84 | HR 62 | Temp 97.8°F | Resp 17 | Ht 74.0 in | Wt 112.1 lb

## 2019-07-24 DIAGNOSIS — Z5111 Encounter for antineoplastic chemotherapy: Secondary | ICD-10-CM

## 2019-07-24 DIAGNOSIS — E43 Unspecified severe protein-calorie malnutrition: Secondary | ICD-10-CM

## 2019-07-24 DIAGNOSIS — C3491 Malignant neoplasm of unspecified part of right bronchus or lung: Secondary | ICD-10-CM

## 2019-07-24 DIAGNOSIS — Z720 Tobacco use: Secondary | ICD-10-CM

## 2019-07-24 DIAGNOSIS — Z5112 Encounter for antineoplastic immunotherapy: Secondary | ICD-10-CM

## 2019-07-24 DIAGNOSIS — C3431 Malignant neoplasm of lower lobe, right bronchus or lung: Secondary | ICD-10-CM | POA: Diagnosis not present

## 2019-07-24 DIAGNOSIS — I2699 Other pulmonary embolism without acute cor pulmonale: Secondary | ICD-10-CM | POA: Diagnosis not present

## 2019-07-24 DIAGNOSIS — E876 Hypokalemia: Secondary | ICD-10-CM | POA: Diagnosis not present

## 2019-07-24 DIAGNOSIS — N12 Tubulo-interstitial nephritis, not specified as acute or chronic: Secondary | ICD-10-CM

## 2019-07-24 LAB — CMP (CANCER CENTER ONLY)
ALT: 7 U/L (ref 0–44)
AST: 19 U/L (ref 15–41)
Albumin: 2.4 g/dL — ABNORMAL LOW (ref 3.5–5.0)
Alkaline Phosphatase: 125 U/L (ref 38–126)
Anion gap: 13 (ref 5–15)
BUN: 8 mg/dL (ref 6–20)
CO2: 32 mmol/L (ref 22–32)
Calcium: 7.7 mg/dL — ABNORMAL LOW (ref 8.9–10.3)
Chloride: 93 mmol/L — ABNORMAL LOW (ref 98–111)
Creatinine: 0.8 mg/dL (ref 0.61–1.24)
GFR, Est AFR Am: >60 mL/min (ref >60)
GFR, Estimated: >60 mL/min (ref >60)
Glucose, Bld: 102 mg/dL — ABNORMAL HIGH (ref 70–99)
Potassium: 2.9 mmol/L — CL (ref 3.5–5.1)
Sodium: 138 mmol/L (ref 135–145)
Total Bilirubin: 0.3 mg/dL (ref 0.3–1.2)
Total Protein: 7.3 g/dL (ref 6.5–8.1)

## 2019-07-24 LAB — CBC WITH DIFFERENTIAL (CANCER CENTER ONLY)
Abs Immature Granulocytes: 0.08 10*3/uL — ABNORMAL HIGH (ref 0.00–0.07)
Basophils Absolute: 0 10*3/uL (ref 0.0–0.1)
Basophils Relative: 0 %
Eosinophils Absolute: 0.1 10*3/uL (ref 0.0–0.5)
Eosinophils Relative: 1 %
HCT: 27.4 % — ABNORMAL LOW (ref 39.0–52.0)
Hemoglobin: 9 g/dL — ABNORMAL LOW (ref 13.0–17.0)
Immature Granulocytes: 1 %
Lymphocytes Relative: 15 %
Lymphs Abs: 1.2 10*3/uL (ref 0.7–4.0)
MCH: 26.7 pg (ref 26.0–34.0)
MCHC: 32.8 g/dL (ref 30.0–36.0)
MCV: 81.3 fL (ref 80.0–100.0)
Monocytes Absolute: 1.8 10*3/uL — ABNORMAL HIGH (ref 0.1–1.0)
Monocytes Relative: 22 %
Neutro Abs: 4.9 10*3/uL (ref 1.7–7.7)
Neutrophils Relative %: 61 %
Platelet Count: 138 10*3/uL — ABNORMAL LOW (ref 150–400)
RBC: 3.37 MIL/uL — ABNORMAL LOW (ref 4.22–5.81)
RDW: 18.6 % — ABNORMAL HIGH (ref 11.5–15.5)
WBC Count: 8.1 10*3/uL (ref 4.0–10.5)
nRBC: 0 % (ref 0.0–0.2)

## 2019-07-24 LAB — URINALYSIS, COMPLETE (UACMP) WITH MICROSCOPIC
Glucose, UA: NEGATIVE mg/dL
Hgb urine dipstick: NEGATIVE
Ketones, ur: NEGATIVE mg/dL
Leukocytes,Ua: NEGATIVE
Nitrite: NEGATIVE
Protein, ur: NEGATIVE mg/dL
Specific Gravity, Urine: 1.013 (ref 1.005–1.030)
pH: 6 (ref 5.0–8.0)

## 2019-07-24 MED ORDER — SODIUM CHLORIDE 0.9 % IV SOLN
500.0000 mg/m2 | Freq: Once | INTRAVENOUS | Status: AC
  Administered 2019-07-24: 15:00:00 800 mg via INTRAVENOUS
  Filled 2019-07-24: qty 20

## 2019-07-24 MED ORDER — DIPHENHYDRAMINE HCL 50 MG/ML IJ SOLN
25.0000 mg | Freq: Once | INTRAMUSCULAR | Status: AC
  Administered 2019-07-24: 14:00:00 25 mg via INTRAVENOUS

## 2019-07-24 MED ORDER — DIPHENHYDRAMINE HCL 50 MG/ML IJ SOLN
INTRAMUSCULAR | Status: AC
  Filled 2019-07-24: qty 1

## 2019-07-24 MED ORDER — POTASSIUM CHLORIDE CRYS ER 20 MEQ PO TBCR: 20.0000 meq | EXTENDED_RELEASE_TABLET | Freq: Two times a day (BID) | ORAL | 0 refills | Status: DC

## 2019-07-24 MED ORDER — SODIUM CHLORIDE 0.9 % IV SOLN
Freq: Once | INTRAVENOUS | Status: AC
  Filled 2019-07-24: qty 5

## 2019-07-24 MED ORDER — SODIUM CHLORIDE 0.9 % IV SOLN: 500.0000 mg/m2 | Freq: Once | INTRAVENOUS | Status: DC

## 2019-07-24 MED ORDER — SODIUM CHLORIDE 0.9 % IV SOLN
200.0000 mg | Freq: Once | INTRAVENOUS | Status: AC
  Administered 2019-07-24: 200 mg via INTRAVENOUS
  Filled 2019-07-24: qty 8

## 2019-07-24 MED ORDER — RIVAROXABAN 20 MG PO TABS: 20.0000 mg | ORAL_TABLET | Freq: Every day | ORAL | 2 refills | Status: DC

## 2019-07-24 MED ORDER — FAMOTIDINE IN NACL 20-0.9 MG/50ML-% IV SOLN
INTRAVENOUS | Status: AC
  Filled 2019-07-24: qty 50

## 2019-07-24 MED ORDER — FAMOTIDINE IN NACL 20-0.9 MG/50ML-% IV SOLN
20.0000 mg | Freq: Once | INTRAVENOUS | Status: AC
  Administered 2019-07-24: 14:00:00 20 mg via INTRAVENOUS

## 2019-07-24 MED ORDER — PALONOSETRON HCL INJECTION 0.25 MG/5ML
0.2500 mg | Freq: Once | INTRAVENOUS | Status: AC
  Administered 2019-07-24: 13:00:00 0.25 mg via INTRAVENOUS

## 2019-07-24 MED ORDER — SODIUM CHLORIDE 0.9 % IV SOLN
517.5000 mg | Freq: Once | INTRAVENOUS | Status: AC
  Administered 2019-07-24: 16:00:00 520 mg via INTRAVENOUS
  Filled 2019-07-24: qty 52

## 2019-07-24 MED ORDER — SODIUM CHLORIDE 0.9 % IV SOLN
Freq: Once | INTRAVENOUS | Status: AC
  Filled 2019-07-24: qty 250

## 2019-07-24 MED ORDER — PALONOSETRON HCL INJECTION 0.25 MG/5ML
INTRAVENOUS | Status: AC
  Filled 2019-07-24: qty 5

## 2019-07-24 NOTE — Progress Notes (Signed)
Oncology Nurse Navigator Documentation  Oncology Nurse Navigator Flowsheets 07/24/2019  Abnormal Finding Date -  Confirmed Diagnosis Date -  Diagnosis Status -  Navigator Follow Up Date: -  Navigator Follow Up Reason: -  Navigator Location [facility]  Referral Date to RadOnc/MedOnc -  Navigator Encounter Type Treatment/I received messages that patient needs help paying for Xeralto medication.  I followed up and financial advocates have provided all resources available for patient.  The pharmacist gave me a web site for assistance.  I printed the information and gave to patient.  I explained that he would have to call the number provided for help.  He verbalized understanding.  I also reached out to Dr. Julien Nordmann to see if he know of drug rep to help patient.   Telephone -  Patient Visit Type MedOnc  Treatment Phase Treatment  Barriers/Navigation Needs Financial Toxicity  Education -  Interventions Coordination of Care;Education  Acuity Level 3-Moderate Needs (3-4 Barriers Identified)  Coordination of Care Other  Education Method Verbal;Written  Time Spent with Patient 64

## 2019-07-24 NOTE — Progress Notes (Signed)
[facility] [phone]   [phone]  OFFICE PROGRESS NOTE  Brand Males, MD [place] [address]  DIAGNOSIS: Stage IV (T1b, N2, M1a) non-small cell lung cancer, adenocarcinoma diagnosed in July 2020. He presented with right lower lobe pulmonary nodule in addition to subcarinal lymphadenopathy as well as bilateral pulmonary nodules.  He was initially treated as a stage IIIa with concurrent chemoradiation but has evidence for disease progression after this course.  Molecular studies by Guardant 360 [id number] NJari PiggTalazoparib Yes 0.4% [id number] (VUS)  No (VUS) 1.7% FGFR1 A343A None (Synonymous) No (Synonymous)  0.1%  PRIOR THERAPY: A course of concurrent chemoradiation with weekly carboplatin for AUC of 2 and paclitaxel 45 mg/M2.  Status post 7 cycles.  Last dose was given on March 12, 2019.  Unfortunately patient has evidence for disease progression after this treatment.  CURRENT THERAPY: Systemic chemotherapy with carboplatin for AUC of 5, Alimta 500 mg/M2 and Keytruda 200 mg IV every 3 weeks.  First dose May 22, 2019.  Status post 3 cycles.  INTERVAL HISTORY: Tyler [id number].[id number]. male returns to the clinic today for follow-up visit.  The patient continues to complain of increasing fatigue and weakness.  He also has no improvement in his weight gain.  His nutrition is still poor.  He is followed by the dietitian at the cancer center.  The patient denied having any current chest pain but has shortness of breath with exertion with mild cough and no hemoptysis.  He denied having any current fever or chills.  He has no nausea, vomiting, diarrhea or constipation.  He has been tolerating his treatment with systemic chemotherapy fairly well.  He had repeat CT scan of the chest, abdomen pelvis performed recently and he is here for evaluation and discussion of his scan  results.  MEDICAL HISTORY: Past Medical History:  Diagnosis Date  . Alcohol abuse   . Chronic back pain   . [place]   . Dyspnea   . Lung mass    mediastinal adenopathy  . Lung nodule   . Multiple pulmonary nodules 04/02/2018  . Pneumonia   . Right lower lobe pulmonary nodule 04/02/2018   12/01/2017: PET SUV 2.1  . Tobacco abuse     ALLERGIES:  has No Known Allergies.  MEDICATIONS:  Current Outpatient Medications  Medication Sig Dispense Refill  . albuterol (PROVENTIL HFA;VENTOLIN HFA) 108 (90 Base) MCG/ACT inhaler Inhale 2 puffs into the lungs every 6 (six) hours as needed for wheezing or shortness of breath. 1 Inhaler 3  . albuterol (PROVENTIL) (2.5 MG/3ML) 0.083% nebulizer solution Take 2.5 mg by nebulization every 6 (six) hours as needed.    . ciprofloxacin (CIPRO) 500 MG tablet Take 1 tablet (500 mg total) by mouth 2 (two) times daily. 14 tablet 0  . feeding supplement, ENSURE ENLIVE, (ENSURE ENLIVE) LIQD Take 237 mLs by mouth 3 (three) times daily between meals. (Patient taking differently: Take 237 mLs by mouth daily. ) 90 Bottle 0  . folic acid (FOLVITE) 1 MG tablet Take 1 tablet (1 mg total) by mouth daily. 30 tablet 4  . lidocaine (XYLOCAINE) 2 % solution 5 ml swallow before meals for radiation esophagitis 200 mL 0  . magnesium oxide (MAG-OX) 400 (241.3 Mg) MG tablet Take 1 tablet (400 mg total) by mouth 2 (two) times daily. 60 tablet 0  . metoprolol tartrate (LOPRESSOR) 25 MG  tablet Take 1 tablet (25 mg total) by mouth 2 (two) times daily.    . Morphine Sulfate (MORPHINE CONCENTRATE) 10 mg / 0.5 ml concentrated solution Take 0.5 mLs by mouth every 4 (four) hours as needed.    Marland Kitchen oxyCODONE-acetaminophen (PERCOCET/ROXICET) 5-325 MG tablet Take 1 tablet by mouth every 8 (eight) hours as needed for severe pain. 30 tablet 0  . polyethylene glycol (MIRALAX / GLYCOLAX) 17 g packet Take 17 g by mouth daily.    . potassium chloride SA  (KLOR-CON) 20 MEQ tablet Take 1 tablet (20 mEq total) by mouth 2 (two) times daily. (Patient taking differently: Take 20 mEq by mouth daily. ) 14 tablet 0  . prochlorperazine (COMPAZINE) 10 MG tablet Take 1 tablet (10 mg total) by mouth every 6 (six) hours as needed for nausea or vomiting. 30 tablet 0  . rivaroxaban (XARELTO) 20 MG TABS tablet Take 1 tablet (20 mg total) by mouth daily with supper. 30 tablet 2  . SENNA-PLUS 8.6-50 MG tablet Take 1-2 tablets by mouth daily. 30 tablet 2  . sucralfate (CARAFATE) 1 g tablet Take 1 g by mouth 4 (four) times daily -  with meals and at bedtime.    Marland Kitchen tiotropium (SPIRIVA HANDIHALER) 18 MCG inhalation capsule Place 1 capsule (18 mcg total) into inhaler and inhale daily. 90 capsule 3  . umeclidinium bromide (INCRUSE ELLIPTA) 62.5 MCG/INH AEPB Inhale 1 puff into the lungs daily.     No current facility-administered medications for this visit.    SURGICAL HISTORY:  Past Surgical History:  Procedure Laterality Date  . APPENDECTOMY    . VIDEO BRONCHOSCOPY WITH ENDOBRONCHIAL NAVIGATION N/A 04/05/2018   Procedure: VIDEO BRONCHOSCOPY WITH ENDOBRONCHIAL NAVIGATION;  Surgeon: Garner Nash, DO;  Location: [facility];  Service: Thoracic;  Laterality: N/A;  . VIDEO BRONCHOSCOPY WITH ENDOBRONCHIAL NAVIGATION N/A 01/11/2019   Procedure: VIDEO BRONCHOSCOPY WITH ENDOBRONCHIAL NAVIGATION;  Surgeon: Garner Nash, DO;  Location: [facility];  Service: Thoracic;  Laterality: N/A;  . VIDEO BRONCHOSCOPY WITH ENDOBRONCHIAL ULTRASOUND N/A 01/11/2019   Procedure: VIDEO BRONCHOSCOPY WITH ENDOBRONCHIAL ULTRASOUND;  Surgeon: Garner Nash, DO;  Location: [facility];  Service: Thoracic;  Laterality: N/A;  . VIDEO BRONCHOSCOPY WITH RADIAL ENDOBRONCHIAL ULTRASOUND N/A 01/11/2019   Procedure: VIDEO BRONCHOSCOPY WITH RADIAL ENDOBRONCHIAL ULTRASOUND;  Surgeon: Garner Nash, DO;  Location: [facility];  Service: Thoracic;  Laterality: N/A;  . WISDOM TOOTH EXTRACTION      REVIEW OF SYSTEMS:   Constitutional: positive for anorexia, fatigue and weight loss Eyes: negative Ears, nose, mouth, throat, and face: negative Respiratory: positive for cough and dyspnea on exertion Cardiovascular: negative Gastrointestinal: negative Genitourinary:negative Integument/breast: negative Hematologic/lymphatic: negative Musculoskeletal:positive for muscle weakness Neurological: negative Behavioral/Psych: negative Endocrine: negative Allergic/Immunologic: negative   PHYSICAL EXAMINATION: General appearance: alert, cooperative, fatigued and no distress Head: Normocephalic, without obvious abnormality, atraumatic Neck: no adenopathy, no JVD, supple, symmetrical, trachea midline and thyroid not enlarged, symmetric, no tenderness/mass/nodules Lymph nodes: Cervical, supraclavicular, and axillary nodes normal. Resp: diminished breath sounds RLL and dullness to percussion RLL Back: symmetric, no curvature. ROM normal. No CVA tenderness. Cardio: regular rate and rhythm, S1, S2 normal, no murmur, click, rub or gallop GI: soft, non-tender; bowel sounds normal; no masses,  no organomegaly Extremities: extremities normal, atraumatic, no cyanosis or edema Neurologic: Alert and oriented X 3, normal strength and tone. Normal symmetric reflexes. Normal coordination and gait  ECOG PERFORMANCE STATUS: 1 - Symptomatic but completely ambulatory  Blood pressure 102/84, pulse 62, temperature 97.8  F (36.6 C), temperature source Temporal, resp. rate 17, height 6' 2"  (1.88 m), weight 112 lb 1.6 oz (50.8 kg), SpO2 100 %.  LABORATORY DATA: Lab Results  Component Value Date   WBC 8.1 07/24/2019   HGB 9.0 (L) 07/24/2019   HCT 27.4 (L) 07/24/2019   MCV 81.3 07/24/2019   PLT 138 (L) 07/24/2019      Chemistry      Component Value Date/Time   NA 138 07/24/2019 1106   K 2.9 (LL) 07/24/2019 1106   CL 93 (L) 07/24/2019 1106   CO2 32 07/24/2019 1106   BUN 8 07/24/2019 1106   CREATININE 0.80 07/24/2019 1106       Component Value Date/Time   CALCIUM 7.7 (L) 07/24/2019 1106   ALKPHOS 125 07/24/2019 1106   AST 19 07/24/2019 1106   ALT 7 07/24/2019 1106   BILITOT 0.3 07/24/2019 1106       RADIOGRAPHIC STUDIES: CT Chest W Contrast  Result Date: 07/23/2019 CLINICAL DATA:  Stage IV right lower lobe lung adenocarcinoma diagnosed July 2020 status post concurrent chemoradiation therapy with ongoing chemotherapy and immunotherapy. Restaging. EXAM: CT CHEST, ABDOMEN, AND PELVIS WITH CONTRAST TECHNIQUE: Multidetector CT imaging of the chest, abdomen and pelvis was performed following the standard protocol during bolus administration of intravenous contrast. CONTRAST:  37m OMNIPAQUE IOHEXOL 300 MG/ML  SOLN COMPARISON:  04/29/2019 chest CT.  12/27/2018 PET-CT. FINDINGS: CT CHEST FINDINGS Cardiovascular: Normal heart size. No significant pericardial effusion/thickening. Left circumflex and right coronary atherosclerosis. Mildly atherosclerotic nonaneurysmal thoracic aorta. Normal caliber pulmonary arteries. No central pulmonary emboli. Mediastinum/Nodes: No discrete thyroid nodules. Unremarkable esophagus. No pathologically enlarged axillary, mediastinal or hilar lymph nodes. Lungs/Pleura: No pneumothorax. Small loculated basilar right pleural effusion, slightly increased from 04/29/2019. Interval removal right pleural chest tube. No left pleural effusion. Mild centrilobular and paraseptal emphysema. The previously visualized extensive consolidation and patchy ground-glass opacity throughout the left lung on 04/29/2019 chest CT has essentially resolved. Extensive irregular masslike consolidation throughout the right lower lobe measures up to 9.8 x 7.4 cm in maximum axial dimensions and is somewhat sharply marginated (series 4/image 101), increased from 8.0 x 5.8 cm on 04/29/2019. Evolving sharply marginated mild right perihilar radiation fibrosis. New cavitary 2.9 x 2.4 cm focus in the medial right lower lobe (series 4/image  106) with air-fluid level. Dominant 0.8 cm solid apical right upper lobe nodule (series 4/image 27), stable. Additional scattered subcentimeter indistinct sub solid nodules in both lungs are not appreciably changed. No definite new pulmonary nodules. Musculoskeletal: No aggressive appearing focal osseous lesions. Stable small bone island in T3 vertebral body. CT ABDOMEN PELVIS FINDINGS Hepatobiliary: Normal liver with no liver mass. Cholelithiasis. No biliary ductal dilatation. Pancreas: Normal, with no mass or duct dilation. Spleen: Normal size. No mass. Adrenals/Urinary Tract: Normal adrenals. No hydronephrosis. Heterogeneous enhancement of the renal parenchyma in both kidneys without discrete renal masses. Subcentimeter hypodense lower left renal cortical lesion is too small to characterize and unchanged since 12/27/2018 PET-CT, considered benign. Normal bladder. Stomach/Bowel: Normal non-distended stomach. Normal caliber small bowel with no small bowel wall thickening. Appendix not discretely visualized. Moderate sigmoid diverticulosis, with no large bowel wall thickening or significant pericolonic fat stranding. Oral contrast transits to the rectum. Vascular/Lymphatic: Atherosclerotic nonaneurysmal abdominal aorta. Patent portal, splenic, hepatic and renal veins. No pathologically enlarged lymph nodes in the abdomen or pelvis. Reproductive: Normal size prostate. Other: No pneumoperitoneum. No focal fluid collection. Small volume pelvic ascites. Musculoskeletal: No aggressive appearing focal osseous lesions. Mild lumbar spondylosis.  IMPRESSION: 1. Extensive irregular masslike consolidation replacing much of the right lower lobe, somewhat sharply marginated, mildly increased since 04/29/2019 chest CT, indeterminate for evolving radiation fibrosis versus viable tumor. New cavitary focus within the medial right lower lobe with air-fluid level. PET-CT could be considered for further evaluation versus close chest CT  follow-up in 3 months. 2. Scattered ill-defined subcentimeter pulmonary nodules are not definitely changed. Previously visualized left lung opacities have largely resolved. 3. No pathologically enlarged thoracic nodes. No evidence of metastatic disease in the abdomen or pelvis. 4. Small loculated right pleural effusion. Interval removal of right chest tube. 5. Small volume pelvic ascites. 6. Nonspecific heterogeneous enhancement of the kidneys bilaterally. No hydronephrosis. Findings could represent medical renal disease or pyelonephritis. No evidence of renal abscess. 7. Aortic Atherosclerosis (ICD10-I70.0) and Emphysema (ICD10-J43.9). These results will be called to the ordering clinician or representative by the Radiologist Assistant, and communication documented in the PACS or zVision Dashboard. Electronically Signed   By: Ilona Sorrel M.D.   On: 07/23/2019 11:09   CT Abdomen Pelvis W Contrast  Result Date: 07/23/2019 CLINICAL DATA:  Stage IV right lower lobe lung adenocarcinoma diagnosed July 2020 status post concurrent chemoradiation therapy with ongoing chemotherapy and immunotherapy. Restaging. EXAM: CT CHEST, ABDOMEN, AND PELVIS WITH CONTRAST TECHNIQUE: Multidetector CT imaging of the chest, abdomen and pelvis was performed following the standard protocol during bolus administration of intravenous contrast. CONTRAST:  66m OMNIPAQUE IOHEXOL 300 MG/ML  SOLN COMPARISON:  04/29/2019 chest CT.  12/27/2018 PET-CT. FINDINGS: CT CHEST FINDINGS Cardiovascular: Normal heart size. No significant pericardial effusion/thickening. Left circumflex and right coronary atherosclerosis. Mildly atherosclerotic nonaneurysmal thoracic aorta. Normal caliber pulmonary arteries. No central pulmonary emboli. Mediastinum/Nodes: No discrete thyroid nodules. Unremarkable esophagus. No pathologically enlarged axillary, mediastinal or hilar lymph nodes. Lungs/Pleura: No pneumothorax. Small loculated basilar right pleural effusion,  slightly increased from 04/29/2019. Interval removal right pleural chest tube. No left pleural effusion. Mild centrilobular and paraseptal emphysema. The previously visualized extensive consolidation and patchy ground-glass opacity throughout the left lung on 04/29/2019 chest CT has essentially resolved. Extensive irregular masslike consolidation throughout the right lower lobe measures up to 9.8 x 7.4 cm in maximum axial dimensions and is somewhat sharply marginated (series 4/image 101), increased from 8.0 x 5.8 cm on 04/29/2019. Evolving sharply marginated mild right perihilar radiation fibrosis. New cavitary 2.9 x 2.4 cm focus in the medial right lower lobe (series 4/image 106) with air-fluid level. Dominant 0.8 cm solid apical right upper lobe nodule (series 4/image 27), stable. Additional scattered subcentimeter indistinct sub solid nodules in both lungs are not appreciably changed. No definite new pulmonary nodules. Musculoskeletal: No aggressive appearing focal osseous lesions. Stable small bone island in T3 vertebral body. CT ABDOMEN PELVIS FINDINGS Hepatobiliary: Normal liver with no liver mass. Cholelithiasis. No biliary ductal dilatation. Pancreas: Normal, with no mass or duct dilation. Spleen: Normal size. No mass. Adrenals/Urinary Tract: Normal adrenals. No hydronephrosis. Heterogeneous enhancement of the renal parenchyma in both kidneys without discrete renal masses. Subcentimeter hypodense lower left renal cortical lesion is too small to characterize and unchanged since 12/27/2018 PET-CT, considered benign. Normal bladder. Stomach/Bowel: Normal non-distended stomach. Normal caliber small bowel with no small bowel wall thickening. Appendix not discretely visualized. Moderate sigmoid diverticulosis, with no large bowel wall thickening or significant pericolonic fat stranding. Oral contrast transits to the rectum. Vascular/Lymphatic: Atherosclerotic nonaneurysmal abdominal aorta. Patent portal, splenic,  hepatic and renal veins. No pathologically enlarged lymph nodes in the abdomen or pelvis. Reproductive: Normal size prostate. Other:  No pneumoperitoneum. No focal fluid collection. Small volume pelvic ascites. Musculoskeletal: No aggressive appearing focal osseous lesions. Mild lumbar spondylosis. IMPRESSION: 1. Extensive irregular masslike consolidation replacing much of the right lower lobe, somewhat sharply marginated, mildly increased since 04/29/2019 chest CT, indeterminate for evolving radiation fibrosis versus viable tumor. New cavitary focus within the medial right lower lobe with air-fluid level. PET-CT could be considered for further evaluation versus close chest CT follow-up in 3 months. 2. Scattered ill-defined subcentimeter pulmonary nodules are not definitely changed. Previously visualized left lung opacities have largely resolved. 3. No pathologically enlarged thoracic nodes. No evidence of metastatic disease in the abdomen or pelvis. 4. Small loculated right pleural effusion. Interval removal of right chest tube. 5. Small volume pelvic ascites. 6. Nonspecific heterogeneous enhancement of the kidneys bilaterally. No hydronephrosis. Findings could represent medical renal disease or pyelonephritis. No evidence of renal abscess. 7. Aortic Atherosclerosis (ICD10-I70.0) and Emphysema (ICD10-J43.9). These results will be called to the ordering clinician or representative by the Radiologist Assistant, and communication documented in the PACS or zVision Dashboard. Electronically Signed   By: Ilona Sorrel M.D.   On: 07/23/2019 11:09    ASSESSMENT AND PLAN: This is a very pleasant 59 years old African-American male recently diagnosed with a stage IIIA non-small cell lung cancer, adenocarcinoma.  He completed a course of concurrent chemoradiation with weekly carboplatin and paclitaxel status post 7 cycles.   The patient tolerated this treatment well except for fatigue and odynophagia. Imaging studies after  the course of concurrent chemoradiation unfortunately showed significant increase in the tumor burden involving the left lower lobe and also now right lower lobe. There was overlying groundglass attenuation and interlobular septal thickening concerning for lymphangitic spread of tumor.  The patient also has new bilateral pleural effusions right greater than left with additional small solid and sub-solid nodules involving the right upper lobe and left lung that were not significantly changed in the interval.  The scan also showed the left upper lobe and left lower lobe segmental branch acute pulmonary emboli. The patient is currently undergoing systemic chemotherapy with carboplatin, Alimta and Keytruda status post 3 cycles.   The patient has been tolerating this treatment well with no concerning adverse effects.  He had repeat CT scan of the chest, abdomen pelvis performed recently.  I personally and independently reviewed the scans and discussed the results with the patient today. His scan showed stable disease with significant consolidation in the right lower lobe. I recommended for the patient to continue his current treatment and he will proceed with cycle #4 of his chemotherapy today as planned. For the recent pulmonary embolism, the patient is not currently on any anticoagulation.  He was unable to continue with Lovenox and unable to afford Xarelto.  We provided the patient with contact for the patient assistance for Xarelto.   For the malnutrition, the patient will continue with his nutrition supplement and he was encouraged to increase his frequency of small meals. For the suspicious pyelonephritis seen on the scan, we will check his urinalysis today.  We will also start the patient empirically on Cipro 500 mg p.o. twice daily. The patient will come back for follow-up visit in 3 weeks for evaluation before starting the first cycle of his maintenance treatment with Alimta and Keytruda.   For the  hypokalemia, will start the patient on potassium chloride 40 mEq p.o. daily for 7 days. He was advised to call immediately if he has any concerning symptoms in the interval. The  patient was advised to call immediately if he has any concerning symptoms in the interval. The patient voices understanding of current disease status and treatment options and is in agreement with the current care plan.  All questions were answered. The patient knows to call the clinic with any problems, questions or concerns. We can certainly see the patient much sooner if necessary.  Disclaimer: This note was dictated with voice recognition software. Similar sounding words can inadvertently be transcribed and may not be corrected upon review.

## 2019-07-24 NOTE — Telephone Encounter (Signed)
Received call from [facility] in our lab stating pt has a critical K+ value of 2.9.  Reported to Dr Mohamed/RN/Cassie.  Pt being seen today.

## 2019-07-24 NOTE — Patient Instructions (Signed)
[facility] Discharge Instructions for Patients Receiving Chemotherapy  Today you received the following chemotherapy agents: Pemetrexed (Alimta), Carboplatin, and Immunotherapy agent: Pembrolizumab (Keytruda)  To help prevent nausea and vomiting after your treatment, we encourage you to take your nausea medication as directed by your MD.   If you develop nausea and vomiting that is not controlled by your nausea medication, call the clinic.   BELOW ARE SYMPTOMS THAT SHOULD BE REPORTED IMMEDIATELY:  *FEVER GREATER THAN 100.5 F  *CHILLS WITH OR WITHOUT FEVER  NAUSEA AND VOMITING THAT IS NOT CONTROLLED WITH YOUR NAUSEA MEDICATION  *UNUSUAL SHORTNESS OF BREATH  *UNUSUAL BRUISING OR BLEEDING  TENDERNESS IN MOUTH AND THROAT WITH OR WITHOUT PRESENCE OF ULCERS  *URINARY PROBLEMS  *BOWEL PROBLEMS  UNUSUAL RASH Items with * indicate a potential emergency and should be followed up as soon as possible.  Feel free to call the clinic should you have any questions or concerns. The clinic phone number is [phone].  Please show the [facility] at check-in to the Emergency Department and triage nurse.  Coronavirus (COVID-19) Are you at risk?  Are you at risk for the Coronavirus (COVID-19)?  To be considered HIGH RISK for Coronavirus (COVID-19), you have to meet the following criteria:  . Traveled to [place], [place], [place], [place] or [place]; or in the [place] to [place], [place], [place], or [place]; and have fever, cough, and shortness of breath within the last 2 weeks of travel OR . Been in close contact with a person diagnosed with COVID-19 within the last 2 weeks and have fever, cough, and shortness of breath . IF YOU DO NOT MEET THESE CRITERIA, YOU ARE CONSIDERED LOW RISK FOR COVID-19.  What to do if you are HIGH RISK for COVID-19?  Marland Kitchen If you are having a medical emergency, call 911. . Seek medical care right away. Before you go to a doctor's  office, urgent care or emergency department, call ahead and tell them about your recent travel, contact with someone diagnosed with COVID-19, and your symptoms. You should receive instructions from your physician's office regarding next steps of care.  . When you arrive at healthcare provider, tell the healthcare staff immediately you have returned from visiting [place], [place], [place], [place] or [place]; or traveled in the [place] to [place], [place], [place], or [place]; in the last two weeks or you have been in close contact with a person diagnosed with COVID-19 in the last 2 weeks.   . Tell the health care staff about your symptoms: fever, cough and shortness of breath. . After you have been seen by a medical provider, you will be either: o Tested for (COVID-19) and discharged home on quarantine except to seek medical care if symptoms worsen, and asked to  - Stay home and avoid contact with others until you get your results (4-5 days)  - Avoid travel on public transportation if possible (such as bus, train, or airplane) or o Sent to the Emergency Department by EMS for evaluation, COVID-19 testing, and possible admission depending on your condition and test results.  What to do if you are LOW RISK for COVID-19?  Reduce your risk of any infection by using the same precautions used for avoiding the common cold or flu:  Marland Kitchen Wash your hands often with soap and warm water for at least 20 seconds.  If soap and water are not readily available, use an alcohol-based hand sanitizer with  at least 60% alcohol.  . If coughing or sneezing, cover your mouth and nose by coughing or sneezing into the elbow areas of your shirt or coat, into a tissue or into your sleeve (not your hands). . Avoid shaking hands with others and consider head nods or verbal greetings only. . Avoid touching your eyes, nose, or mouth with unwashed hands.  . Avoid close contact with people who are sick. . Avoid places or  events with large numbers of people in one location, like concerts or sporting events. . Carefully consider travel plans you have or are making. . If you are planning any travel outside or inside the [place], visit the CDC's Travelers' Health webpage for the latest health notices. . If you have some symptoms but not all symptoms, continue to monitor at home and seek medical attention if your symptoms worsen. . If you are having a medical emergency, call 911.   [facility] / e-Visit: [email or web address]         [facility]: [facility]: [phone]                   [facility]: [phone]

## 2019-07-24 NOTE — Progress Notes (Signed)
Update pemetrexed dose to reflect weight loss per Dr. Julien Nordmann.   Demetrius Charity, PharmD, [facility] Phone: [phone] 07/24/2019

## 2019-07-25 LAB — URINE CULTURE: Culture: NO GROWTH

## 2019-07-26 ENCOUNTER — Telehealth: Payer: Self-pay | Admitting: Medical Oncology

## 2019-07-26 MED ORDER — RIVAROXABAN 20 MG PO TABS: 20.0000 mg | ORAL_TABLET | Freq: Every day | ORAL | 2 refills | Status: DC

## 2019-07-26 MED FILL — XARELTO 20 MG TABLET: 20 | 30 days supply | Qty: 30 | Fill #0

## 2019-07-26 NOTE — Telephone Encounter (Signed)
He cannot afford xarelto at [facility]- I called xarelto  to [facility] long -pt has grant for medications. I told him to go there today to pick up xarelto. I cancelled rx at [facility].

## 2019-07-26 NOTE — Addendum Note (Signed)
Addended by: Ardeen Garland on: 07/26/2019 10:27 AM   Modules accepted: Orders

## 2019-07-30 ENCOUNTER — Telehealth: Payer: Self-pay | Admitting: *Deleted

## 2019-07-30 NOTE — Progress Notes (Signed)
Tyler [name]  Tyler Males, MD [place] [address]  DIAGNOSIS: Stage IV(T1b, N2, M1a) non-small cell lung cancer, adenocarcinoma diagnosed in July 2020.He presented with right lower lobe pulmonary nodule in addition to subcarinal lymphadenopathyas well as bilateral pulmonary nodules.He was initially treated as a stage IIIa with concurrent chemoradiation but has evidence for disease progression after this course.  Molecular studies byGuardant 360 [id number] NArsenio [name] Rucaparib, Talazoparib Yes 0.4% [id number] (VUS) No (VUS) 1.7% FGFR1 A343A None (Synonymous)No (Synonymous) 0.1%  PRIOR THERAPY: A course of concurrent chemoradiation with weekly carboplatin for AUC of 2 and paclitaxel 45 mg/M2. Status post 7 cycles. Last dose was given on March 12, 2019. Unfortunately patient has evidence for disease progression after this treatment.  CURRENT THERAPY: Systemic chemotherapy with carboplatin for AUC of 5, Alimta 500 mg/M2 and Keytruda 200 mg IV every 3 weeks. First dose May 22, 2019.Status post 4 cycles  INTERVAL HISTORY: Tyler MARINARO59y.o. male returns to the clinic for an acute visit. The patient called yesterday with the chief complaint of mouth sores and poor nutrition/appetite. He also continues to experience fatigue, shortness of breath with exertion, and generalized weakness. The patient's most recently chemotherapy was administered on 07/24/2019. At his last office visit last week, several prescriptions, including oral potassium, was sent to his pharmacy. The patient expresses financial and transportation constraints, making getting his medications challenging. He stopped taking his Xarelto for the month of December 2020 due to the expense of the medication. He is prescribed Xarelto for a pulmonary embolism which was seen in early November 2020. With assistance for the  expenses, he picked up his new prescription for Xarelto last week. He is reportedly now taking this as prescribed. He did not pick up his potassium. In fact, he states that he is out of almost all of his prescription medications, including his metoprolol.   Regarding his mouth sores, those started approximately 2-3 days ago. He already is followed by nutrition at the cancer center for his poor appetite and weight loss. The new onset mouth sores on his cheek and lower lip have made eating even more challenging. He has not lost weight since his last appointment.   The patient denies recent fevers, chills, or night sweats. He denies chest pain, diaphoresis, cough, or hemoptysis. He reports his usual baseline shortness of breath. He denies nausea, vomiting, diarrhea, or constiaption. He denies headaches, visual changes, or dizziness. He states that he feels like he needs another blood transfusion today. He is here for evaluation and recommendations regarding his condition.    MEDICAL HISTORY: Past Medical History:  Diagnosis Date  . Alcohol abuse   . Chronic back pain   . COPD (chronic obstructive pulmonary disease) ([facility]   . Dyspnea   . Lung mass    mediastinal adenopathy  . Lung nodule   . Multiple pulmonary nodules 04/02/2018  . Pneumonia   . Right lower lobe pulmonary nodule 04/02/2018   12/01/2017: PET SUV 2.1  . Tobacco abuse     ALLERGIES:  has No Known Allergies.  MEDICATIONS:  No current facility-administered medications for this visit.   Current Outpatient Medications  Medication Sig Dispense Refill  . albuterol (PROVENTIL HFA;VENTOLIN HFA) 108 (90 Base) MCG/ACT inhaler Inhale 2 puffs into the lungs every 6 (six) hours as needed for wheezing or shortness of breath. 1 Inhaler 3  . ciprofloxacin (CIPRO) 500 MG tablet Take 1 tablet (500 mg  total) by mouth 2 (two) times daily. (Patient not taking: Reported on 07/31/2019) 14 tablet 0  . feeding supplement, ENSURE ENLIVE, (ENSURE  ENLIVE) LIQD Take 237 mLs by mouth 3 (three) times daily between meals. (Patient taking differently: Take 237 mLs by mouth daily. ) 90 Bottle 0  . folic acid (FOLVITE) 1 MG tablet Take 1 tablet (1 mg total) by mouth daily. 30 tablet 4  . magnesium oxide (MAG-OX) 400 (241.3 Mg) MG tablet Take 1 tablet (400 mg total) by mouth 2 (two) times daily. 60 tablet 0  . metoprolol tartrate (LOPRESSOR) 25 MG tablet Take 1 tablet (25 mg total) by mouth 2 (two) times daily.    Marland Kitchen oxyCODONE-acetaminophen (PERCOCET/ROXICET) 5-325 MG tablet Take 1 tablet by mouth every 8 (eight) hours as needed for severe pain. 30 tablet 0  . polyethylene glycol (MIRALAX / GLYCOLAX) 17 g packet Take 17 g by mouth daily. (Patient not taking: Reported on 07/31/2019)    . rivaroxaban (XARELTO) 20 MG TABS tablet Take 1 tablet (20 mg total) by mouth daily with supper. 30 tablet 2  . SENNA-PLUS 8.6-50 MG tablet Take 1-2 tablets by mouth daily. (Patient not taking: Reported on 07/31/2019) 30 tablet 2  . albuterol (PROVENTIL) (2.5 MG/3ML) 0.083% nebulizer solution Take 2.5 mg by nebulization every 6 (six) hours as needed.    . lidocaine (XYLOCAINE) 2 % solution 5 ml swallow before meals for radiation esophagitis (Patient not taking: Reported on 07/31/2019) 200 mL 0  . magic mouthwash SOLN Take 5 mLs by mouth 4 (four) times daily as needed for mouth pain. 400 mL 0  . Morphine Sulfate (MORPHINE CONCENTRATE) 10 mg / 0.5 ml concentrated solution Take 0.5 mLs by mouth every 4 (four) hours as needed.    . potassium chloride SA (KLOR-CON) 20 MEQ tablet Take 1 tablet (20 mEq total) by mouth 2 (two) times daily. 14 tablet 0  . prochlorperazine (COMPAZINE) 10 MG tablet Take 1 tablet (10 mg total) by mouth every 6 (six) hours as needed for nausea or vomiting. (Patient not taking: Reported on 07/31/2019) 30 tablet 0  . tiotropium (SPIRIVA HANDIHALER) 18 MCG inhalation capsule Place 1 capsule (18 mcg total) into inhaler and inhale daily. (Patient not taking:  Reported on 07/31/2019) 90 capsule 3  . umeclidinium bromide (INCRUSE ELLIPTA) 62.5 MCG/INH AEPB Inhale 1 puff into the lungs daily.     Facility-Administered Medications Ordered in Other Visits  Medication Dose Route Frequency Provider Last Rate Last Admin  . potassium chloride 10 mEq in 100 mL IVPB  10 mEq Intravenous Q1 Hr x 2 Dalia Heading, PA-C 100 mL/hr at 07/31/19 1533 10 mEq at 07/31/19 1533    SURGICAL HISTORY:  Past Surgical History:  Procedure Laterality Date  . APPENDECTOMY    . VIDEO BRONCHOSCOPY WITH ENDOBRONCHIAL NAVIGATION N/A 04/05/2018   Procedure: VIDEO BRONCHOSCOPY WITH ENDOBRONCHIAL NAVIGATION;  Surgeon: Garner Nash, DO;  Location: [facility];  Service: Thoracic;  Laterality: N/A;  . VIDEO BRONCHOSCOPY WITH ENDOBRONCHIAL NAVIGATION N/A 01/11/2019   Procedure: VIDEO BRONCHOSCOPY WITH ENDOBRONCHIAL NAVIGATION;  Surgeon: Garner Nash, DO;  Location: [facility];  Service: Thoracic;  Laterality: N/A;  . VIDEO BRONCHOSCOPY WITH ENDOBRONCHIAL ULTRASOUND N/A 01/11/2019   Procedure: VIDEO BRONCHOSCOPY WITH ENDOBRONCHIAL ULTRASOUND;  Surgeon: Garner Nash, DO;  Location: [facility];  Service: Thoracic;  Laterality: N/A;  . VIDEO BRONCHOSCOPY WITH RADIAL ENDOBRONCHIAL ULTRASOUND N/A 01/11/2019   Procedure: VIDEO BRONCHOSCOPY WITH RADIAL ENDOBRONCHIAL ULTRASOUND;  Surgeon: Garner Nash, DO;  Location: [facility];  Service: Thoracic;  Laterality: N/A;  . WISDOM TOOTH EXTRACTION      REVIEW OF SYSTEMS:   Review of Systems  Constitutional: Positive for fatigue, decreased appetite, and generalized weakness. Negative for chills and fever. HENT: Positive for mouth sores on his lower lip and left cheek. Negative for  nosebleeds, sore throat and trouble swallowing.   Eyes: Negative for eye problems and icterus.  Respiratory: Positive for baseline shortness of breath. Negative for cough, hemoptysis, and wheezing.  Cardiovascular: Negative for chest pain and leg swelling.  Gastrointestinal:  Negative for abdominal pain, constipation, diarrhea, nausea and vomiting.  Genitourinary: Negative for bladder incontinence, difficulty urinating, dysuria, frequency and hematuria.   Musculoskeletal: Negative for back pain, gait problem, neck pain and neck stiffness.  Skin: Negative for itching and rash.  Neurological: Negative for dizziness, extremity weakness, gait problem, headaches, light-headedness and seizures.  Hematological: Negative for adenopathy. Does not bruise/bleed easily.  Psychiatric/Behavioral: Negative for confusion, depression and sleep disturbance. The patient is not nervous/anxious.     PHYSICAL EXAMINATION:  Blood pressure 99/65, pulse 64, temperature 98 F (36.7 C), temperature source Temporal, resp. rate 18, height 6' 2"  (1.88 m), weight 113 lb (51.3 kg), SpO2 100 %.  ECOG PERFORMANCE STATUS: 2 - Symptomatic, <50% confined to bed  Physical Exam  Constitutional: Oriented to person, place, and time and cachetic and chronically ill appearing male and in no distress.  HENT:  Head: Normocephalic and atraumatic.  Mouth/Throat: Poor dentition. Mouth sores present on lower lip and left cheek.  Eyes: Conjunctivae are normal. Right eye exhibits no discharge. Left eye exhibits no discharge. No scleral icterus.  Neck: Normal range of motion. Neck supple.  Cardiovascular: Tachycardic, irregular rhythm, normal heart sounds and intact distal pulses.   Pulmonary/Chest: Effort normal and breath sounds normal. No respiratory distress. No wheezes. No rales.  Abdominal: Soft. Bowel sounds are normal. Exhibits no distension and no mass. There is no tenderness.  Musculoskeletal: Normal range of motion. Exhibits no edema.  Lymphadenopathy:    No cervical adenopathy.  Neurological: Alert and oriented to person, place, and time. Exhibits normal muscle tone. Gait normal. Coordination normal.  Skin: Skin is warm and dry. No rash noted. Not diaphoretic. No erythema. No pallor.  Psychiatric:  Mood, memory and judgment normal.  Vitals reviewed.  LABORATORY DATA: Lab Results  Component Value Date   WBC 1.8 (L) 07/31/2019   HGB 9.3 (L) 07/31/2019   HCT 29.1 (L) 07/31/2019   MCV 82.7 07/31/2019   PLT 95 (L) 07/31/2019      Chemistry      Component Value Date/Time   NA 137 07/31/2019 1433   K 2.7 (LL) 07/31/2019 1433   CL 88 (L) 07/31/2019 1433   CO2 36 (H) 07/31/2019 1433   BUN 16 07/31/2019 1433   CREATININE 1.13 07/31/2019 1433   CREATININE 1.01 07/31/2019 1125      Component Value Date/Time   CALCIUM 8.3 (L) 07/31/2019 1433   ALKPHOS 99 07/31/2019 1125   AST 26 07/31/2019 1125   ALT 11 07/31/2019 1125   BILITOT 0.6 07/31/2019 1125       RADIOGRAPHIC STUDIES:  CT Chest W Contrast  Result Date: 07/23/2019 CLINICAL DATA:  Stage IV right lower lobe lung adenocarcinoma diagnosed July 2020 status post concurrent chemoradiation therapy with ongoing chemotherapy and immunotherapy. Restaging. EXAM: CT CHEST, ABDOMEN, AND PELVIS WITH CONTRAST TECHNIQUE: Multidetector CT imaging of the chest, abdomen and pelvis was performed following the standard protocol during  bolus administration of intravenous contrast. CONTRAST:  31m OMNIPAQUE IOHEXOL 300 MG/ML  SOLN COMPARISON:  04/29/2019 chest CT.  12/27/2018 PET-CT. FINDINGS: CT CHEST FINDINGS Cardiovascular: Normal heart size. No significant pericardial effusion/thickening. Left circumflex and right coronary atherosclerosis. Mildly atherosclerotic nonaneurysmal thoracic aorta. Normal caliber pulmonary arteries. No central pulmonary emboli. Mediastinum/Nodes: No discrete thyroid nodules. Unremarkable esophagus. No pathologically enlarged axillary, mediastinal or hilar lymph nodes. Lungs/Pleura: No pneumothorax. Small loculated basilar right pleural effusion, slightly increased from 04/29/2019. Interval removal right pleural chest tube. No left pleural effusion. Mild centrilobular and paraseptal emphysema. The previously visualized  extensive consolidation and patchy ground-glass opacity throughout the left lung on 04/29/2019 chest CT has essentially resolved. Extensive irregular masslike consolidation throughout the right lower lobe measures up to 9.8 x 7.4 cm in maximum axial dimensions and is somewhat sharply marginated (series 4/image 101), increased from 8.0 x 5.8 cm on 04/29/2019. Evolving sharply marginated mild right perihilar radiation fibrosis. New cavitary 2.9 x 2.4 cm focus in the medial right lower lobe (series 4/image 106) with air-fluid level. Dominant 0.8 cm solid apical right upper lobe nodule (series 4/image 27), stable. Additional scattered subcentimeter indistinct sub solid nodules in both lungs are not appreciably changed. No definite new pulmonary nodules. Musculoskeletal: No aggressive appearing focal osseous lesions. Stable small bone island in T3 vertebral body. CT ABDOMEN PELVIS FINDINGS Hepatobiliary: Normal liver with no liver mass. Cholelithiasis. No biliary ductal dilatation. Pancreas: Normal, with no mass or duct dilation. Spleen: Normal size. No mass. Adrenals/Urinary Tract: Normal adrenals. No hydronephrosis. Heterogeneous enhancement of the renal parenchyma in both kidneys without discrete renal masses. Subcentimeter hypodense lower left renal cortical lesion is too small to characterize and unchanged since 12/27/2018 PET-CT, considered benign. Normal bladder. Stomach/Bowel: Normal non-distended stomach. Normal caliber small bowel with no small bowel wall thickening. Appendix not discretely visualized. Moderate sigmoid diverticulosis, with no large bowel wall thickening or significant pericolonic fat stranding. Oral contrast transits to the rectum. Vascular/Lymphatic: Atherosclerotic nonaneurysmal abdominal aorta. Patent portal, splenic, hepatic and renal veins. No pathologically enlarged lymph nodes in the abdomen or pelvis. Reproductive: Normal size prostate. Other: No pneumoperitoneum. No focal fluid  collection. Small volume pelvic ascites. Musculoskeletal: No aggressive appearing focal osseous lesions. Mild lumbar spondylosis. IMPRESSION: 1. Extensive irregular masslike consolidation replacing much of the right lower lobe, somewhat sharply marginated, mildly increased since 04/29/2019 chest CT, indeterminate for evolving radiation fibrosis versus viable tumor. New cavitary focus within the medial right lower lobe with air-fluid level. PET-CT could be considered for further evaluation versus close chest CT follow-up in 3 months. 2. Scattered ill-defined subcentimeter pulmonary nodules are not definitely changed. Previously visualized left lung opacities have largely resolved. 3. No pathologically enlarged thoracic nodes. No evidence of metastatic disease in the abdomen or pelvis. 4. Small loculated right pleural effusion. Interval removal of right chest tube. 5. Small volume pelvic ascites. 6. Nonspecific heterogeneous enhancement of the kidneys bilaterally. No hydronephrosis. Findings could represent medical renal disease or pyelonephritis. No evidence of renal abscess. 7. Aortic Atherosclerosis (ICD10-I70.0) and Emphysema (ICD10-J43.9). These results will be called to the ordering clinician or representative by the Radiologist Assistant, and communication documented in the PACS or zVision Dashboard. Electronically Signed   By: JIlona SorrelM.D.   On: 07/23/2019 11:09   CT Abdomen Pelvis W Contrast  Result Date: 07/23/2019 CLINICAL DATA:  Stage IV right lower lobe lung adenocarcinoma diagnosed July 2020 status post concurrent chemoradiation therapy with ongoing chemotherapy and immunotherapy. Restaging. EXAM: CT CHEST, ABDOMEN, AND PELVIS  WITH CONTRAST TECHNIQUE: Multidetector CT imaging of the chest, abdomen and pelvis was performed following the standard protocol during bolus administration of intravenous contrast. CONTRAST:  41m OMNIPAQUE IOHEXOL 300 MG/ML  SOLN COMPARISON:  04/29/2019 chest CT.   12/27/2018 PET-CT. FINDINGS: CT CHEST FINDINGS Cardiovascular: Normal heart size. No significant pericardial effusion/thickening. Left circumflex and right coronary atherosclerosis. Mildly atherosclerotic nonaneurysmal thoracic aorta. Normal caliber pulmonary arteries. No central pulmonary emboli. Mediastinum/Nodes: No discrete thyroid nodules. Unremarkable esophagus. No pathologically enlarged axillary, mediastinal or hilar lymph nodes. Lungs/Pleura: No pneumothorax. Small loculated basilar right pleural effusion, slightly increased from 04/29/2019. Interval removal right pleural chest tube. No left pleural effusion. Mild centrilobular and paraseptal emphysema. The previously visualized extensive consolidation and patchy ground-glass opacity throughout the left lung on 04/29/2019 chest CT has essentially resolved. Extensive irregular masslike consolidation throughout the right lower lobe measures up to 9.8 x 7.4 cm in maximum axial dimensions and is somewhat sharply marginated (series 4/image 101), increased from 8.0 x 5.8 cm on 04/29/2019. Evolving sharply marginated mild right perihilar radiation fibrosis. New cavitary 2.9 x 2.4 cm focus in the medial right lower lobe (series 4/image 106) with air-fluid level. Dominant 0.8 cm solid apical right upper lobe nodule (series 4/image 27), stable. Additional scattered subcentimeter indistinct sub solid nodules in both lungs are not appreciably changed. No definite new pulmonary nodules. Musculoskeletal: No aggressive appearing focal osseous lesions. Stable small bone island in T3 vertebral body. CT ABDOMEN PELVIS FINDINGS Hepatobiliary: Normal liver with no liver mass. Cholelithiasis. No biliary ductal dilatation. Pancreas: Normal, with no mass or duct dilation. Spleen: Normal size. No mass. Adrenals/Urinary Tract: Normal adrenals. No hydronephrosis. Heterogeneous enhancement of the renal parenchyma in both kidneys without discrete renal masses. Subcentimeter hypodense  lower left renal cortical lesion is too small to characterize and unchanged since 12/27/2018 PET-CT, considered benign. Normal bladder. Stomach/Bowel: Normal non-distended stomach. Normal caliber small bowel with no small bowel wall thickening. Appendix not discretely visualized. Moderate sigmoid diverticulosis, with no large bowel wall thickening or significant pericolonic fat stranding. Oral contrast transits to the rectum. Vascular/Lymphatic: Atherosclerotic nonaneurysmal abdominal aorta. Patent portal, splenic, hepatic and renal veins. No pathologically enlarged lymph nodes in the abdomen or pelvis. Reproductive: Normal size prostate. Other: No pneumoperitoneum. No focal fluid collection. Small volume pelvic ascites. Musculoskeletal: No aggressive appearing focal osseous lesions. Mild lumbar spondylosis. IMPRESSION: 1. Extensive irregular masslike consolidation replacing much of the right lower lobe, somewhat sharply marginated, mildly increased since 04/29/2019 chest CT, indeterminate for evolving radiation fibrosis versus viable tumor. New cavitary focus within the medial right lower lobe with air-fluid level. PET-CT could be considered for further evaluation versus close chest CT follow-up in 3 months. 2. Scattered ill-defined subcentimeter pulmonary nodules are not definitely changed. Previously visualized left lung opacities have largely resolved. 3. No pathologically enlarged thoracic nodes. No evidence of metastatic disease in the abdomen or pelvis. 4. Small loculated right pleural effusion. Interval removal of right chest tube. 5. Small volume pelvic ascites. 6. Nonspecific heterogeneous enhancement of the kidneys bilaterally. No hydronephrosis. Findings could represent medical renal disease or pyelonephritis. No evidence of renal abscess. 7. Aortic Atherosclerosis (ICD10-I70.0) and Emphysema (ICD10-J43.9). These results will be called to the ordering clinician or representative by the Radiologist  Assistant, and communication documented in the PACS or zVision Dashboard. Electronically Signed   By: JIlona SorrelM.D.   On: 07/23/2019 11:09     ASSESSMENT/PLAN:  This is a very pleasant 59year old African-American male initially diagnosed with stage IIIa non-small cell  lung cancer, adenocarcinoma in July 2020. He presented with a right lower lobe pulmonary nodule in addition to subcarinal lymphadenopathy as well as bilateral pulmonary nodules. He was initially treated with weekly concurrent chemoradiation with carboplatin for an AUC of 2 and paclitaxel 45 mg/m. He had evidence of disease progression after this course withsignificant increase in the tumor burden involving the left lower lobe and also now right lower lobe. There was overlying groundglass attenuation and interlobular septal thickening concerning for lymphangitic spread of tumor. The patient also has new bilateral pleural effusions right greater than left with additional small solid and sub-solid nodules involving the right upper lobe and left lung that were not significantly changed in the interval. The scan also showed the left upper lobe and left lower lobe segmental branch acute pulmonary emboli. He has no actionable mutations.   Patient recently started palliative systemic chemotherapy with carboplatin for an AUC of 5, Alimta 500 mg/m and Keytruda 200 mg IV every 3 weeks. He is status post his 4th cycle of treatment. He is tolerating his treatment fairly well but the patient has a poor performance status and is experiencing significant fatigue, weakness, and weight loss.  The patient was seen with Dr. Julien Nordmann today. His potassium is critically low today at 2.7. He had not picked up his oral potassium which was prescribed last week. In fact, he is not taking majority of his medications due to financial constraints. He is reportedly just started re-taking his Xarelto which was filled last week. He has a Radio producer for medications at  [facility].   The patient was found to have tachycardia and an irregular rhythm on exam today. The patient denies any history of arrhythmia; however, per chart review, it appears that the patient has a history of document paroxysmal atrial fibrillation. An EKG was performed in the clinic which showed atrial fibrillation with RVR. The patient is not taking his metoprolol as prescribed.   Due to his critically low potassium in conjunction with his atrial fibrillation with RVR, we recommend that the patient be evaluated in the emergency room for close monitoring and management of his condition.   We will arrange for 2 units of pRBCs to be performed in the clinic on Friday for anemia (Hbg 8.2) , which may be contributing to his fatigue and generalized weakness.   I have also sent a prescription for magic mouth wash to his pharmacy for his mouth sores.   We will see him back for evaluation in 2 weeks for evaluation before starting cycle #5. We will continue to monitor his labs closely weekly.   The patient was advised to call immediately if he has any concerning symptoms in the interval. The patient voices understanding of current disease status and treatment options and is in agreement with the current care plan. All questions were answered. The patient knows to call the clinic with any problems, questions or concerns. We can certainly see the patient much sooner if necessary   Orders Placed This Encounter  Procedures  . EKG 12-Lead     Teigan Sahli L Palmira Stickle, PA-C 07/31/19  ADDENDUM: Hematology/Oncology Attending: I had a face-to-face encounter with the patient today.  I recommended his care plan.  This is a very pleasant 59 years old African-American male with recurrent non-small cell lung cancer, adenocarcinoma and he is currently undergoing systemic chemotherapy with carboplatin, Alimta and Keytruda status post 4 cycles.  His last scan showed no evidence for disease progression and the  patient received cycle #  4 last week.  He presented today with significant weakness and fatigue as well as dehydration and lack of appetite.  Repeat blood work today showed persistent anemia in addition to hypokalemia. EKG in the clinic showed persistent tachycardia with suspicious inferior ischemic infarct. Our plan was to give the patient IV fluid and arrange for blood transfusion as well as potassium supplement but with the new finding on the EKG, we decided to transfer the patient to the emergency department for further evaluation and consultation as well as management by cardiology. We will see him back for follow-up visit as previously planned before starting cycle #5 of his treatment. The patient was advised to call immediately if he has any concerning symptoms in the interval.  Disclaimer: This [name] was dictated with voice recognition software. Similar sounding words can inadvertently be transcribed and may be missed upon review. Eilleen Kempf, MD 07/31/19

## 2019-07-30 NOTE — Telephone Encounter (Signed)
Received vm message to call patient. He did not say what he was calling about. TCT patient and spoke with him.  He states he has developed severe mouth sores and swelling in his right (?) cheek.  He states it is very difficult to swallow and is not taking in as much fluid as he should-maybe 2 cans of ensure/day.  Water is very difficult to swallow. Denies fever.  Pt has lab appt tomorrow at 11:30.Damaris Schooner with Cassie Heilingoetter, PA and she is agreeable to seeing him tomorrow at 12noon. Made patient aware of this appt. He voiced understanding.

## 2019-07-31 ENCOUNTER — Inpatient Hospital Stay: Payer: Medicaid Other

## 2019-07-31 ENCOUNTER — Other Ambulatory Visit: Payer: Self-pay

## 2019-07-31 ENCOUNTER — Emergency Department (HOSPITAL_COMMUNITY)
Admission: EM | Admit: 2019-07-31 | Discharge: 2019-07-31 | Disposition: A | Discharge status: Home / Self Care | Payer: Medicaid Other | Attending: Emergency Medicine | Admitting: Emergency Medicine

## 2019-07-31 ENCOUNTER — Other Ambulatory Visit: Payer: Self-pay | Admitting: Physician Assistant

## 2019-07-31 ENCOUNTER — Emergency Department (HOSPITAL_COMMUNITY): Payer: Medicaid Other

## 2019-07-31 ENCOUNTER — Inpatient Hospital Stay (HOSPITAL_BASED_OUTPATIENT_CLINIC_OR_DEPARTMENT_OTHER): Payer: Medicaid Other | Admitting: Physician Assistant

## 2019-07-31 ENCOUNTER — Encounter: Payer: Self-pay | Admitting: Physician Assistant

## 2019-07-31 VITALS — BP 99/65 | HR 64 | Temp 98.0°F | Resp 18 | Ht 74.0 in | Wt 113.0 lb

## 2019-07-31 DIAGNOSIS — C3490 Malignant neoplasm of unspecified part of unspecified bronchus or lung: Secondary | ICD-10-CM | POA: Insufficient documentation

## 2019-07-31 DIAGNOSIS — I4819 Other persistent atrial fibrillation: Secondary | ICD-10-CM

## 2019-07-31 DIAGNOSIS — R0602 Shortness of breath: Secondary | ICD-10-CM | POA: Diagnosis not present

## 2019-07-31 DIAGNOSIS — I4891 Unspecified atrial fibrillation: Secondary | ICD-10-CM

## 2019-07-31 DIAGNOSIS — F17210 Nicotine dependence, cigarettes, uncomplicated: Secondary | ICD-10-CM | POA: Diagnosis not present

## 2019-07-31 DIAGNOSIS — J449 Chronic obstructive pulmonary disease, unspecified: Secondary | ICD-10-CM | POA: Diagnosis not present

## 2019-07-31 DIAGNOSIS — C3491 Malignant neoplasm of unspecified part of right bronchus or lung: Secondary | ICD-10-CM

## 2019-07-31 DIAGNOSIS — Z79899 Other long term (current) drug therapy: Secondary | ICD-10-CM | POA: Diagnosis not present

## 2019-07-31 DIAGNOSIS — D649 Anemia, unspecified: Secondary | ICD-10-CM

## 2019-07-31 DIAGNOSIS — K1230 Oral mucositis (ulcerative), unspecified: Secondary | ICD-10-CM

## 2019-07-31 DIAGNOSIS — E876 Hypokalemia: Secondary | ICD-10-CM

## 2019-07-31 DIAGNOSIS — C3431 Malignant neoplasm of lower lobe, right bronchus or lung: Secondary | ICD-10-CM | POA: Diagnosis not present

## 2019-07-31 LAB — CBC WITH DIFFERENTIAL/PLATELET
Abs Immature Granulocytes: 0.05 10*3/uL (ref 0.00–0.07)
Basophils Absolute: 0 10*3/uL (ref 0.0–0.1)
Basophils Relative: 1 %
Eosinophils Absolute: 0 10*3/uL (ref 0.0–0.5)
Eosinophils Relative: 0 %
HCT: 29.1 % — ABNORMAL LOW (ref 39.0–52.0)
Hemoglobin: 9.3 g/dL — ABNORMAL LOW (ref 13.0–17.0)
Immature Granulocytes: 3 %
Lymphocytes Relative: 22 %
Lymphs Abs: 0.4 10*3/uL — ABNORMAL LOW (ref 0.7–4.0)
MCH: 26.4 pg (ref 26.0–34.0)
MCHC: 32 g/dL (ref 30.0–36.0)
MCV: 82.7 fL (ref 80.0–100.0)
Monocytes Absolute: 0.1 10*3/uL (ref 0.1–1.0)
Monocytes Relative: 4 %
Neutro Abs: 1.3 10*3/uL — ABNORMAL LOW (ref 1.7–7.7)
Neutrophils Relative %: 70 %
Platelets: 95 10*3/uL — ABNORMAL LOW (ref 150–400)
RBC: 3.52 MIL/uL — ABNORMAL LOW (ref 4.22–5.81)
RDW: 18.6 % — ABNORMAL HIGH (ref 11.5–15.5)
WBC: 1.8 10*3/uL — ABNORMAL LOW (ref 4.0–10.5)
nRBC: 0 % (ref 0.0–0.2)

## 2019-07-31 LAB — CMP (CANCER CENTER ONLY)
ALT: 11 U/L (ref 0–44)
AST: 26 U/L (ref 15–41)
Albumin: 2.4 g/dL — ABNORMAL LOW (ref 3.5–5.0)
Alkaline Phosphatase: 99 U/L (ref 38–126)
Anion gap: 13 (ref 5–15)
BUN: 14 mg/dL (ref 6–20)
CO2: 34 mmol/L — ABNORMAL HIGH (ref 22–32)
Calcium: 7.6 mg/dL — ABNORMAL LOW (ref 8.9–10.3)
Chloride: 89 mmol/L — ABNORMAL LOW (ref 98–111)
Creatinine: 1.01 mg/dL (ref 0.61–1.24)
GFR, Est AFR Am: >60 mL/min (ref >60)
GFR, Estimated: >60 mL/min (ref >60)
Glucose, Bld: 129 mg/dL — ABNORMAL HIGH (ref 70–99)
Potassium: 2.7 mmol/L — CL (ref 3.5–5.1)
Sodium: 136 mmol/L (ref 135–145)
Total Bilirubin: 0.6 mg/dL (ref 0.3–1.2)
Total Protein: 7.2 g/dL (ref 6.5–8.1)

## 2019-07-31 LAB — BASIC METABOLIC PANEL
Anion gap: 13 (ref 5–15)
BUN: 16 mg/dL (ref 6–20)
CO2: 36 mmol/L — ABNORMAL HIGH (ref 22–32)
Calcium: 8.3 mg/dL — ABNORMAL LOW (ref 8.9–10.3)
Chloride: 88 mmol/L — ABNORMAL LOW (ref 98–111)
Creatinine, Ser: 1.13 mg/dL (ref 0.61–1.24)
GFR calc Af Amer: >60 mL/min (ref >60)
GFR calc non Af Amer: >60 mL/min (ref >60)
Glucose, Bld: 88 mg/dL (ref 70–99)
Potassium: 2.7 mmol/L — CL (ref 3.5–5.1)
Sodium: 137 mmol/L (ref 135–145)

## 2019-07-31 LAB — CBC WITH DIFFERENTIAL (CANCER CENTER ONLY)
Abs Immature Granulocytes: 0.03 10*3/uL (ref 0.00–0.07)
Basophils Absolute: 0 10*3/uL (ref 0.0–0.1)
Basophils Relative: 1 %
Eosinophils Absolute: 0 10*3/uL (ref 0.0–0.5)
Eosinophils Relative: 0 %
HCT: 25.2 % — ABNORMAL LOW (ref 39.0–52.0)
Hemoglobin: 8.2 g/dL — ABNORMAL LOW (ref 13.0–17.0)
Immature Granulocytes: 2 %
Lymphocytes Relative: 28 %
Lymphs Abs: 0.5 10*3/uL — ABNORMAL LOW (ref 0.7–4.0)
MCH: 26.3 pg (ref 26.0–34.0)
MCHC: 32.5 g/dL (ref 30.0–36.0)
MCV: 80.8 fL (ref 80.0–100.0)
Monocytes Absolute: 0.1 10*3/uL (ref 0.1–1.0)
Monocytes Relative: 3 %
Neutro Abs: 1.2 10*3/uL — ABNORMAL LOW (ref 1.7–7.7)
Neutrophils Relative %: 66 %
Platelet Count: 88 10*3/uL — ABNORMAL LOW (ref 150–400)
RBC: 3.12 MIL/uL — ABNORMAL LOW (ref 4.22–5.81)
RDW: 18.7 % — ABNORMAL HIGH (ref 11.5–15.5)
WBC Count: 1.8 10*3/uL — ABNORMAL LOW (ref 4.0–10.5)
nRBC: 0 % (ref 0.0–0.2)

## 2019-07-31 MED ORDER — METOPROLOL TARTRATE 25 MG PO TABS
25.0000 mg | ORAL_TABLET | Freq: Once | ORAL | Status: AC
  Administered 2019-07-31: 25 mg via ORAL
  Filled 2019-07-31: qty 1

## 2019-07-31 MED ORDER — POTASSIUM CHLORIDE CRYS ER 20 MEQ PO TBCR
40.0000 meq | EXTENDED_RELEASE_TABLET | Freq: Once | ORAL | Status: AC
  Administered 2019-07-31: 40 meq via ORAL
  Filled 2019-07-31: qty 2

## 2019-07-31 MED ORDER — MAGIC MOUTHWASH: 5.0000 mL | Freq: Four times a day (QID) | ORAL | 0 refills | Status: AC | PRN

## 2019-07-31 MED ORDER — POTASSIUM CHLORIDE CRYS ER 20 MEQ PO TBCR: 20.0000 meq | EXTENDED_RELEASE_TABLET | Freq: Two times a day (BID) | ORAL | 0 refills | Status: AC

## 2019-07-31 MED ORDER — DILTIAZEM HCL 25 MG/5ML IV SOLN
10.0000 mg | Freq: Once | INTRAVENOUS | Status: DC
  Filled 2019-07-31: qty 5

## 2019-07-31 MED ORDER — POTASSIUM CHLORIDE 10 MEQ/100ML IV SOLN
10.0000 meq | INTRAVENOUS | Status: AC
  Administered 2019-07-31 (×2): 10 meq via INTRAVENOUS
  Filled 2019-07-31 (×2): qty 100

## 2019-07-31 MED ORDER — METOPROLOL TARTRATE 5 MG/5ML IV SOLN
5.0000 mg | Freq: Once | INTRAVENOUS | Status: AC
  Administered 2019-07-31: 5 mg via INTRAVENOUS
  Filled 2019-07-31: qty 5

## 2019-07-31 MED FILL — POTASSIUM CHLORIDE CRYS ER: 20 | 7 days supply | Qty: 14 | Fill #0

## 2019-07-31 NOTE — ED Triage Notes (Signed)
Transported from [facility] for Afib with RVR--- history of lung cancer; + shob. Has been without Metoprolol for 2 weeks per patient.

## 2019-07-31 NOTE — ED Provider Notes (Signed)
[facility] DEPT Provider Note   CSN: [id number] Arrival date & time: 07/31/19  1320     History Chief Complaint  Patient presents with  . Atrial Fibrillation    Tyler [name] is a 59 y.o. male.  HPI Patient presents to the emergency department with atrial fibrillation that was noted at the cancer center while he was there for an appointment.  Patient states that he is not aware that his heart is beating fast.  Patient states that he is having no other symptoms.  Patient denies any chest pain shortness of breath, weakness, dizziness, headache, blurred vision, nausea, vomiting, nominal pain, back pain, fever, cough, abdominal pain, or syncope.  Patient states that he was there getting some routine laboratory testing performed when they noted that his heart rate was elevated.    Past Medical History:  Diagnosis Date  . Alcohol abuse   . Chronic back pain   . COPD (chronic obstructive pulmonary disease) ([place])   . Dyspnea   . Lung mass    mediastinal adenopathy  . Lung nodule   . Multiple pulmonary nodules 04/02/2018  . Pneumonia   . Right lower lobe pulmonary nodule 04/02/2018   12/01/2017: PET SUV 2.1  . Tobacco abuse     Patient Active Problem List   Diagnosis Date Noted  . Adenocarcinoma of right lung, stage 4 ([place]) 05/15/2019  . Encounter for antineoplastic immunotherapy 05/15/2019  . Malignant neoplasm of right lung ([place])   . AF (paroxysmal atrial fibrillation) ([place]) 04/27/2019  . Atrial fibrillation with RVR ([place]) 04/25/2019  . Malnutrition of moderate degree 04/21/2019  . Aspiration pneumonia ([place]) 04/19/2019  . PNA (pneumonia) 04/19/2019  . Loculated pleural effusion   . Acute pulmonary embolism ([place]) 04/07/2019  . Pulmonary embolism ([place]) 04/06/2019  . Anemia 04/06/2019  . Hypokalemia 04/06/2019  . Malignant neoplasm of bronchus of right lower lobe ([place]) 03/01/2019  . Adenocarcinoma of right lung, stage 3 ([place]) 01/17/2019  .  Goals of care, counseling/discussion 01/17/2019  . Encounter for antineoplastic chemotherapy 01/17/2019  . Tobacco abuse 01/10/2019  . Mediastinal adenopathy 01/10/2019  . Right upper lobe pulmonary nodule 04/19/2018  . Multiple pulmonary nodules 04/02/2018  . Right lower lobe pulmonary nodule 04/02/2018  . Protein-calorie malnutrition, severe 08/09/2017  . Acute lung injury   . Pneumonitis   . BOOP (bronchiolitis obliterans with organizing pneumonia) ([place])   . Acute respiratory failure with hypoxia ([place]) 08/08/2017  . Hypoxia 08/07/2017  . Dehydration   . Diarrhea   . CAP (community acquired pneumonia) 08/04/2017  . Hyponatremia 08/04/2017  . Tachycardia 08/04/2017  . Influenza A     Past Surgical History:  Procedure Laterality Date  . APPENDECTOMY    . VIDEO BRONCHOSCOPY WITH ENDOBRONCHIAL NAVIGATION N/A 04/05/2018   Procedure: VIDEO BRONCHOSCOPY WITH ENDOBRONCHIAL NAVIGATION;  Surgeon: Garner Nash, DO;  Location: [facility];  Service: Thoracic;  Laterality: N/A;  . VIDEO BRONCHOSCOPY WITH ENDOBRONCHIAL NAVIGATION N/A 01/11/2019   Procedure: VIDEO BRONCHOSCOPY WITH ENDOBRONCHIAL NAVIGATION;  Surgeon: Garner Nash, DO;  Location: [place];  Service: Thoracic;  Laterality: N/A;  . VIDEO BRONCHOSCOPY WITH ENDOBRONCHIAL ULTRASOUND N/A 01/11/2019   Procedure: VIDEO BRONCHOSCOPY WITH ENDOBRONCHIAL ULTRASOUND;  Surgeon: Garner Nash, DO;  Location: [place];  Service: Thoracic;  Laterality: N/A;  . VIDEO BRONCHOSCOPY WITH RADIAL ENDOBRONCHIAL ULTRASOUND N/A 01/11/2019   Procedure: VIDEO BRONCHOSCOPY WITH RADIAL ENDOBRONCHIAL ULTRASOUND;  Surgeon: Garner Nash, DO;  Location: [place];  Service: Thoracic;  Laterality:  N/A;  . WISDOM TOOTH EXTRACTION         Family History  Problem Relation Age of Onset  . Heart attack Maternal Grandfather     Social History   Tobacco Use  . Smoking status: Current Every Day Smoker    Packs/day: 0.25    Years: 40.00    Pack years: 10.00     Types: Cigarettes  . Smokeless tobacco: Never Used  Substance Use Topics  . Alcohol use: Not Currently  . Drug use: Not Currently    Types: Marijuana    Home Medications Prior to Admission medications   Medication Sig Start Date End Date Taking? Authorizing Provider  albuterol (PROVENTIL HFA;VENTOLIN HFA) 108 (90 Base) MCG/ACT inhaler Inhale 2 puffs into the lungs every 6 (six) hours as needed for wheezing or shortness of breath. 06/27/18   Icard, Leory Plowman L, DO  albuterol (PROVENTIL) (2.5 MG/3ML) 0.083% nebulizer solution Take 2.5 mg by nebulization every 6 (six) hours as needed. 05/18/19   [provider]  ciprofloxacin (CIPRO) 500 MG tablet Take 1 tablet (500 mg total) by mouth 2 (two) times daily. 07/23/19   Heilingoetter, Cassandra L, PA-C  feeding supplement, ENSURE ENLIVE, (ENSURE ENLIVE) LIQD Take 237 mLs by mouth 3 (three) times daily between meals. Patient taking differently: Take 237 mLs by mouth daily.  08/11/17   Ghimire, Henreitta Leber, MD  folic acid (FOLVITE) 1 MG tablet Take 1 tablet (1 mg total) by mouth daily. 05/15/19   [name], MD  lidocaine (XYLOCAINE) 2 % solution 5 ml swallow before meals for radiation esophagitis Patient not taking: Reported on 07/31/2019 03/30/19   [name], MD  magic mouthwash SOLN Take 5 mLs by mouth 4 (four) times daily as needed for mouth pain. 07/31/19   Heilingoetter, Cassandra L, PA-C  magnesium oxide (MAG-OX) 400 (241.3 Mg) MG tablet Take 1 tablet (400 mg total) by mouth 2 (two) times daily. 05/15/19   [name], MD  metoprolol tartrate (LOPRESSOR) 25 MG tablet Take 1 tablet (25 mg total) by mouth 2 (two) times daily. 05/02/19   Hongalgi, Lenis Dickinson, MD  Morphine Sulfate (MORPHINE CONCENTRATE) 10 mg / 0.5 ml concentrated solution Take 0.5 mLs by mouth every 4 (four) hours as needed. 05/08/19   [provider]  oxyCODONE-acetaminophen (PERCOCET/ROXICET) 5-325 MG tablet Take 1 tablet by mouth every 8 (eight) hours as  needed for severe pain. 07/03/19   [name], MD  polyethylene glycol (MIRALAX / GLYCOLAX) 17 g packet Take 17 g by mouth daily. 05/03/19   Hongalgi, Lenis Dickinson, MD  potassium chloride SA (KLOR-CON) 20 MEQ tablet Take 1 tablet (20 mEq total) by mouth 2 (two) times daily. 07/31/19   Heilingoetter, Cassandra L, PA-C  prochlorperazine (COMPAZINE) 10 MG tablet Take 1 tablet (10 mg total) by mouth every 6 (six) hours as needed for nausea or vomiting. Patient not taking: Reported on 07/31/2019 05/15/19   [name], MD  rivaroxaban (XARELTO) 20 MG TABS tablet Take 1 tablet (20 mg total) by mouth daily with supper. 07/26/19   Heilingoetter, Cassandra L, PA-C  SENNA-PLUS 8.6-50 MG tablet Take 1-2 tablets by mouth daily. 06/12/19   Heilingoetter, Cassandra L, PA-C  sucralfate (CARAFATE) 1 g tablet Take 1 g by mouth 4 (four) times daily -  with meals and at bedtime.    [provider]  tiotropium (SPIRIVA HANDIHALER) 18 MCG inhalation capsule Place 1 capsule (18 mcg total) into inhaler and inhale daily. Patient not taking: Reported on 07/31/2019 06/27/18  01/05/20  Icard, Octavio Graves, DO  umeclidinium bromide (INCRUSE ELLIPTA) 62.5 MCG/INH AEPB Inhale 1 puff into the lungs daily.    [provider]    Allergies    Patient has no known allergies.  Review of Systems   Review of Systems All other systems negative except as documented in the HPI. All pertinent positives and negatives as reviewed in the HPI. Physical Exam Updated Vital Signs BP (!) 113/93   Pulse (!) 128   Temp 98 F (36.7 C) (Oral)   Resp (!) 22   SpO2 100%   Physical Exam Vitals and nursing note reviewed.  Constitutional:      General: He is not in acute distress.    Appearance: He is well-developed.  HENT:     Head: Normocephalic and atraumatic.  Eyes:     Pupils: Pupils are equal, round, and reactive to light.  Cardiovascular:     Rate and Rhythm: Tachycardia present. Rhythm irregularly irregular.      Heart sounds: Normal heart sounds. No murmur. No friction rub. No gallop.   Pulmonary:     Effort: Pulmonary effort is normal. No respiratory distress.     Breath sounds: Normal breath sounds. No wheezing.  Abdominal:     General: Bowel sounds are normal. There is no distension.     Palpations: Abdomen is soft.     Tenderness: There is no abdominal tenderness.  Musculoskeletal:     Cervical back: Normal range of motion and neck supple.  Skin:    General: Skin is warm and dry.     Capillary Refill: Capillary refill takes less than 2 seconds.     Findings: No erythema or rash.  Neurological:     Mental Status: He is alert and oriented to person, place, and time.     Motor: No abnormal muscle tone.     Coordination: Coordination normal.  Psychiatric:        Behavior: Behavior normal.     ED Results / Procedures / Treatments   Labs (all labs ordered are listed, but only abnormal results are displayed) Labs Reviewed  BASIC METABOLIC PANEL  CBC WITH DIFFERENTIAL/PLATELET    EKG None  Radiology No results found.  Procedures Procedures (including critical care time)  Medications Ordered in ED Medications  metoprolol tartrate (LOPRESSOR) injection 5 mg (5 mg Intravenous Given 07/31/19 1437)  metoprolol tartrate (LOPRESSOR) tablet 25 mg (25 mg Oral Given 07/31/19 1437)    ED Course  I have reviewed the triage vital signs and the nursing notes.  Pertinent labs & imaging results that were available during my care of the patient were reviewed by me and considered in my medical decision making (see chart for details).    MDM Rules/Calculators/A&P                      Patient be given IV metoprolol and his oral metoprolol.  Patient had basic laboratory testing performed.  Patient has been otherwise stable here in the emergency department.  Patient has no complaints at this time. Final Clinical Impression(s) / ED Diagnoses Final diagnoses:  None    Rx / DC Orders ED  Discharge Orders    None       Dalia Heading, PA-C 07/31/19 1446    Lucrezia Starch, MD 08/01/19 [phone]

## 2019-07-31 NOTE — ED Notes (Signed)
Date and time results received: 07/31/19 15:25 (use smartphrase ".now" to insert current time)  Test: Potassium  Critical Value: 2.7  Name of Provider Notified: Gerald Stabs, [place]   Orders Received? Or Actions Taken? This critical has already been addressed with orders.

## 2019-07-31 NOTE — Discharge Instructions (Signed)
Follow up with Cardiology Continue taking your home Blood thinner and the Metoprolol

## 2019-07-31 NOTE — ED Provider Notes (Signed)
Care transferred from [place], [place]. See note for full HPI.  In summation CA patient in from cancer center. Seen for mouth sores and noted to be tachycardic.  Hx of Afib, tachy at cancer center. On Xarelto for PE dx in Nov,2020.  Plan for labs, IVF, reevaluate HR  Can dc home if HR improves. Dr. Roslynn Amble has evaluated patient.  Currently on systemic Chemo-- Carboplatin, Alimta, Keytruda  Last Chemo on 07/24/19  Not taking Potassium, just picked up his Metoprolol, taking anticoagulation as prescribed.  Followed by [facility] center for poor appetite, weight loss. Has not lost weight since last apt. Baseline breathing, no SOB, hemoptysis, dizziness.  Has blood transfusion on Friday  Patient denies prior hx of Afib with RVR however with multiple EKG with Afib and documentation per last hospitalization with Afib.  Physical Exam  BP 110/83   Pulse (!) 105   Temp 98 F (36.7 C) (Oral)   Resp 20   SpO2 100%   Physical Exam Vitals and nursing note reviewed.  Constitutional:      General: He is not in acute distress.    Appearance: He is well-developed. He is not toxic-appearing or diaphoretic.  HENT:     Head: Atraumatic.  Eyes:     Pupils: Pupils are equal, round, and reactive to light.  Cardiovascular:     Rate and Rhythm: Tachycardia present. Rhythm irregular.  Pulmonary:     Effort: Pulmonary effort is normal. No respiratory distress.     Breath sounds: Normal breath sounds. No wheezing or rales.  Chest:     Chest wall: No tenderness.  Abdominal:     General: Bowel sounds are normal. There is no distension.     Palpations: Abdomen is soft.  Musculoskeletal:        General: Normal range of motion.     Cervical back: Normal range of motion and neck supple.     Comments: Moves all 4 extremities without difficulty.  Skin:    General: Skin is warm and dry.     Capillary Refill: Capillary refill takes less than 2 seconds.  Neurological:     General: No focal deficit present.      Mental Status: He is alert and oriented to person, place, and time.    ED Course/Procedures   Clinical Course as of Jul 31 1955  Tue Jul 31, 2019  1534 Hypokalemia 2.7, potassium x2 ordered by previous provider  Basic metabolic panel(!!) [BH]  0350 Neutropenia to 1.8, hemoglobin 9.3, platelets 95  CBC with Differential(!) [BH]  1707 Discussed patient with Dykstra attending MD. Feels neurotpenia likely due to Chemo tretaments. Patient Afebrile here in ED   [BH]    Clinical Course User Index [BH] Macguire Holsinger A, PA-C    Procedures Labs Reviewed  BASIC METABOLIC PANEL - Abnormal; Notable for the following components:      Result Value   Potassium 2.7 (*)    Chloride 88 (*)    CO2 36 (*)    Calcium 8.3 (*)    All other components within normal limits  CBC WITH DIFFERENTIAL/PLATELET - Abnormal; Notable for the following components:   WBC 1.8 (*)    RBC 3.52 (*)    Hemoglobin 9.3 (*)    HCT 29.1 (*)    RDW 18.6 (*)    Platelets 95 (*)    Neutro Abs 1.3 (*)    Lymphs Abs 0.4 (*)    All other components within normal limits  CT Chest W  Contrast  Result Date: 07/23/2019 CLINICAL DATA:  Stage IV right lower lobe lung adenocarcinoma diagnosed July 2020 status post concurrent chemoradiation therapy with ongoing chemotherapy and immunotherapy. Restaging. EXAM: CT CHEST, ABDOMEN, AND PELVIS WITH CONTRAST TECHNIQUE: Multidetector CT imaging of the chest, abdomen and pelvis was performed following the standard protocol during bolus administration of intravenous contrast. CONTRAST:  5mL OMNIPAQUE IOHEXOL 300 MG/ML  SOLN COMPARISON:  04/29/2019 chest CT.  12/27/2018 PET-CT. FINDINGS: CT CHEST FINDINGS Cardiovascular: Normal heart size. No significant pericardial effusion/thickening. Left circumflex and right coronary atherosclerosis. Mildly atherosclerotic nonaneurysmal thoracic aorta. Normal caliber pulmonary arteries. No central pulmonary emboli. Mediastinum/Nodes: No discrete thyroid  nodules. Unremarkable esophagus. No pathologically enlarged axillary, mediastinal or hilar lymph nodes. Lungs/Pleura: No pneumothorax. Small loculated basilar right pleural effusion, slightly increased from 04/29/2019. Interval removal right pleural chest tube. No left pleural effusion. Mild centrilobular and paraseptal emphysema. The previously visualized extensive consolidation and patchy ground-glass opacity throughout the left lung on 04/29/2019 chest CT has essentially resolved. Extensive irregular masslike consolidation throughout the right lower lobe measures up to 9.8 x 7.4 cm in maximum axial dimensions and is somewhat sharply marginated (series 4/image 101), increased from 8.0 x 5.8 cm on 04/29/2019. Evolving sharply marginated mild right perihilar radiation fibrosis. New cavitary 2.9 x 2.4 cm focus in the medial right lower lobe (series 4/image 106) with air-fluid level. Dominant 0.8 cm solid apical right upper lobe nodule (series 4/image 27), stable. Additional scattered subcentimeter indistinct sub solid nodules in both lungs are not appreciably changed. No definite new pulmonary nodules. Musculoskeletal: No aggressive appearing focal osseous lesions. Stable small bone island in T3 vertebral body. CT ABDOMEN PELVIS FINDINGS Hepatobiliary: Normal liver with no liver mass. Cholelithiasis. No biliary ductal dilatation. Pancreas: Normal, with no mass or duct dilation. Spleen: Normal size. No mass. Adrenals/Urinary Tract: Normal adrenals. No hydronephrosis. Heterogeneous enhancement of the renal parenchyma in both kidneys without discrete renal masses. Subcentimeter hypodense lower left renal cortical lesion is too small to characterize and unchanged since 12/27/2018 PET-CT, considered benign. Normal bladder. Stomach/Bowel: Normal non-distended stomach. Normal caliber small bowel with no small bowel wall thickening. Appendix not discretely visualized. Moderate sigmoid diverticulosis, with no large bowel wall  thickening or significant pericolonic fat stranding. Oral contrast transits to the rectum. Vascular/Lymphatic: Atherosclerotic nonaneurysmal abdominal aorta. Patent portal, splenic, hepatic and renal veins. No pathologically enlarged lymph nodes in the abdomen or pelvis. Reproductive: Normal size prostate. Other: No pneumoperitoneum. No focal fluid collection. Small volume pelvic ascites. Musculoskeletal: No aggressive appearing focal osseous lesions. Mild lumbar spondylosis. IMPRESSION: 1. Extensive irregular masslike consolidation replacing much of the right lower lobe, somewhat sharply marginated, mildly increased since 04/29/2019 chest CT, indeterminate for evolving radiation fibrosis versus viable tumor. New cavitary focus within the medial right lower lobe with air-fluid level. PET-CT could be considered for further evaluation versus close chest CT follow-up in 3 months. 2. Scattered ill-defined subcentimeter pulmonary nodules are not definitely changed. Previously visualized left lung opacities have largely resolved. 3. No pathologically enlarged thoracic nodes. No evidence of metastatic disease in the abdomen or pelvis. 4. Small loculated right pleural effusion. Interval removal of right chest tube. 5. Small volume pelvic ascites. 6. Nonspecific heterogeneous enhancement of the kidneys bilaterally. No hydronephrosis. Findings could represent medical renal disease or pyelonephritis. No evidence of renal abscess. 7. Aortic Atherosclerosis (ICD10-I70.0) and Emphysema (ICD10-J43.9). These results will be called to the ordering clinician or representative by the Radiologist Assistant, and communication documented in the PACS or zVision Dashboard. Electronically  Signed   By: Ilona Sorrel M.D.   On: 07/23/2019 11:09   CT Abdomen Pelvis W Contrast  Result Date: 07/23/2019 CLINICAL DATA:  Stage IV right lower lobe lung adenocarcinoma diagnosed July 2020 status post concurrent chemoradiation therapy with ongoing  chemotherapy and immunotherapy. Restaging. EXAM: CT CHEST, ABDOMEN, AND PELVIS WITH CONTRAST TECHNIQUE: Multidetector CT imaging of the chest, abdomen and pelvis was performed following the standard protocol during bolus administration of intravenous contrast. CONTRAST:  7mL OMNIPAQUE IOHEXOL 300 MG/ML  SOLN COMPARISON:  04/29/2019 chest CT.  12/27/2018 PET-CT. FINDINGS: CT CHEST FINDINGS Cardiovascular: Normal heart size. No significant pericardial effusion/thickening. Left circumflex and right coronary atherosclerosis. Mildly atherosclerotic nonaneurysmal thoracic aorta. Normal caliber pulmonary arteries. No central pulmonary emboli. Mediastinum/Nodes: No discrete thyroid nodules. Unremarkable esophagus. No pathologically enlarged axillary, mediastinal or hilar lymph nodes. Lungs/Pleura: No pneumothorax. Small loculated basilar right pleural effusion, slightly increased from 04/29/2019. Interval removal right pleural chest tube. No left pleural effusion. Mild centrilobular and paraseptal emphysema. The previously visualized extensive consolidation and patchy ground-glass opacity throughout the left lung on 04/29/2019 chest CT has essentially resolved. Extensive irregular masslike consolidation throughout the right lower lobe measures up to 9.8 x 7.4 cm in maximum axial dimensions and is somewhat sharply marginated (series 4/image 101), increased from 8.0 x 5.8 cm on 04/29/2019. Evolving sharply marginated mild right perihilar radiation fibrosis. New cavitary 2.9 x 2.4 cm focus in the medial right lower lobe (series 4/image 106) with air-fluid level. Dominant 0.8 cm solid apical right upper lobe nodule (series 4/image 27), stable. Additional scattered subcentimeter indistinct sub solid nodules in both lungs are not appreciably changed. No definite new pulmonary nodules. Musculoskeletal: No aggressive appearing focal osseous lesions. Stable small bone island in T3 vertebral body. CT ABDOMEN PELVIS FINDINGS  Hepatobiliary: Normal liver with no liver mass. Cholelithiasis. No biliary ductal dilatation. Pancreas: Normal, with no mass or duct dilation. Spleen: Normal size. No mass. Adrenals/Urinary Tract: Normal adrenals. No hydronephrosis. Heterogeneous enhancement of the renal parenchyma in both kidneys without discrete renal masses. Subcentimeter hypodense lower left renal cortical lesion is too small to characterize and unchanged since 12/27/2018 PET-CT, considered benign. Normal bladder. Stomach/Bowel: Normal non-distended stomach. Normal caliber small bowel with no small bowel wall thickening. Appendix not discretely visualized. Moderate sigmoid diverticulosis, with no large bowel wall thickening or significant pericolonic fat stranding. Oral contrast transits to the rectum. Vascular/Lymphatic: Atherosclerotic nonaneurysmal abdominal aorta. Patent portal, splenic, hepatic and renal veins. No pathologically enlarged lymph nodes in the abdomen or pelvis. Reproductive: Normal size prostate. Other: No pneumoperitoneum. No focal fluid collection. Small volume pelvic ascites. Musculoskeletal: No aggressive appearing focal osseous lesions. Mild lumbar spondylosis. IMPRESSION: 1. Extensive irregular masslike consolidation replacing much of the right lower lobe, somewhat sharply marginated, mildly increased since 04/29/2019 chest CT, indeterminate for evolving radiation fibrosis versus viable tumor. New cavitary focus within the medial right lower lobe with air-fluid level. PET-CT could be considered for further evaluation versus close chest CT follow-up in 3 months. 2. Scattered ill-defined subcentimeter pulmonary nodules are not definitely changed. Previously visualized left lung opacities have largely resolved. 3. No pathologically enlarged thoracic nodes. No evidence of metastatic disease in the abdomen or pelvis. 4. Small loculated right pleural effusion. Interval removal of right chest tube. 5. Small volume pelvic  ascites. 6. Nonspecific heterogeneous enhancement of the kidneys bilaterally. No hydronephrosis. Findings could represent medical renal disease or pyelonephritis. No evidence of renal abscess. 7. Aortic Atherosclerosis (ICD10-I70.0) and Emphysema (ICD10-J43.9). These results will be called  to the ordering clinician or representative by the Radiologist Assistant, and communication documented in the PACS or zVision Dashboard. Electronically Signed   By: Ilona Sorrel M.D.   On: 07/23/2019 11:09   DG Chest Portable 1 View  Result Date: 07/31/2019 CLINICAL DATA:  Shortness of breath EXAM: PORTABLE CHEST 1 VIEW COMPARISON:  April 28, 2019 FINDINGS: There is loculated effusion at the right base with atelectatic change in the right base. There is consolidation in the medial right base. Lungs elsewhere clear. Heart is upper normal in size with pulmonary vascularity normal. No adenopathy. No bone lesions. IMPRESSION: Loculated pleural effusion right base with consolidation medial right base and atelectatic change elsewhere in the right base. Left lung clear. Heart upper normal in size. Electronically Signed   By: Lowella Grip III M.D.   On: 07/31/2019 18:23   MDM  Plan to follow up on HR and labs. Likely dc home if HR improves.  Patient with hx of afib on Metoprolol presents for evaluation of atrial fibrillation from cancer center, care transferred from [place], [facility].    Patient received dose of metoprolol as well as home metoprolol.  He has been out of his metoprolol x1 week.  Patient denies any current complaints.  Heart rate has trended down, currently 93 in room with p waves present.  Does intermittently go in and out of atrial fibrillation however no A. fib with RVR after metoprolol.  He denies any chest pain, shortness of breath, tolerating p.o. intake without difficulty.  On reevaluation patient requesting to go home.  Chest xray with know loculated small effusion similar to previous CT chest. Symptoms  likely due to him being without his metoprolol.  He was given IV potassium for his hypokalemia.  He has follow-up outpatient with oncology in 3 days for a blood transfusion.  He is currently anticoagulated on Xarelto.  He is compliant with this, per patient.  He did recently have this medication refilled today. EKG not pulling over into Epic. HR 93, Afib, baseline waner. HR improved from previous.  Patient with persistent A. fib however he is rate controlled.  We will have him follow-up with cardiology.  Discussed he needs to continue to take his metoprolol and anticoagulation as prescribed.  He is to return to the emergency department for any new or worsening symptoms.  Low suspicion for ACS, worsening PE, bacterial infectious process, dissection as cause of his symptoms.   The patient has been appropriately medically screened and/or stabilized in the ED. I have low suspicion for any other emergent medical condition which would require further screening, evaluation or treatment in the ED or require inpatient management.  Patient is hemodynamically stable and in no acute distress.  Patient able to ambulate in department prior to ED.  Evaluation does not show acute pathology that would require ongoing or additional emergent interventions while in the emergency department or further inpatient treatment.  I have discussed the diagnosis with the patient and answered all questions.  Pain is been managed while in the emergency department and patient has no further complaints prior to discharge.  Patient is comfortable with plan discussed in room and is stable for discharge at this time.  I have discussed strict return precautions for returning to the emergency department.  Patient was encouraged to follow-up with PCP/specialist refer to at discharge.      Ahnya Akre A, PA-C 07/31/19 1957    Lacretia Leigh, MD 08/01/19 1011

## 2019-07-31 NOTE — Progress Notes (Signed)
Diane from lab called for a critical value for potassium of 2.7.  This value was reported to [facility], [place] and she ordered a run of 40 mg potassium.  Gardiner Rhyme, RN

## 2019-07-31 NOTE — ED Notes (Signed)
EDP at bedside  

## 2019-08-01 ENCOUNTER — Telehealth: Payer: Self-pay | Admitting: Medical Oncology

## 2019-08-01 ENCOUNTER — Telehealth: Payer: Self-pay | Admitting: Physician Assistant

## 2019-08-01 MED FILL — CMPD-MMW-NYST/GERI/DIPHEN: 14 days supply | Qty: 280 | Fill #0

## 2019-08-01 NOTE — Telephone Encounter (Signed)
Asking about kdur and MMW- both called or escribed to [facility] outpt . Pt notified.

## 2019-08-01 NOTE — Telephone Encounter (Signed)
Per 1/19 los Keep as scheduled

## 2019-08-03 ENCOUNTER — Other Ambulatory Visit: Payer: Self-pay

## 2019-08-03 ENCOUNTER — Encounter (HOSPITAL_COMMUNITY): Payer: Self-pay | Admitting: Internal Medicine

## 2019-08-03 ENCOUNTER — Inpatient Hospital Stay (HOSPITAL_COMMUNITY)
Admission: EM | Admit: 2019-08-03 | Discharge: 2019-08-10 | DRG: 808 | Disposition: A | Discharge status: Home Health | Payer: Medicaid Other | Attending: Internal Medicine | Admitting: Internal Medicine

## 2019-08-03 ENCOUNTER — Inpatient Hospital Stay: Payer: Medicaid Other

## 2019-08-03 ENCOUNTER — Other Ambulatory Visit: Payer: Self-pay | Admitting: Physician Assistant

## 2019-08-03 ENCOUNTER — Telehealth: Payer: Self-pay | Admitting: Medical Oncology

## 2019-08-03 ENCOUNTER — Inpatient Hospital Stay (HOSPITAL_BASED_OUTPATIENT_CLINIC_OR_DEPARTMENT_OTHER): Payer: Medicaid Other | Admitting: Medical

## 2019-08-03 ENCOUNTER — Emergency Department (HOSPITAL_COMMUNITY): Payer: Medicaid Other

## 2019-08-03 DIAGNOSIS — R5081 Fever presenting with conditions classified elsewhere: Secondary | ICD-10-CM | POA: Diagnosis present

## 2019-08-03 DIAGNOSIS — B3781 Candidal esophagitis: Secondary | ICD-10-CM | POA: Diagnosis present

## 2019-08-03 DIAGNOSIS — C3491 Malignant neoplasm of unspecified part of right bronchus or lung: Secondary | ICD-10-CM | POA: Diagnosis not present

## 2019-08-03 DIAGNOSIS — Z515 Encounter for palliative care: Secondary | ICD-10-CM | POA: Diagnosis not present

## 2019-08-03 DIAGNOSIS — R0602 Shortness of breath: Secondary | ICD-10-CM | POA: Diagnosis not present

## 2019-08-03 DIAGNOSIS — J9601 Acute respiratory failure with hypoxia: Secondary | ICD-10-CM | POA: Diagnosis present

## 2019-08-03 DIAGNOSIS — Z66 Do not resuscitate: Secondary | ICD-10-CM | POA: Diagnosis present

## 2019-08-03 DIAGNOSIS — R531 Weakness: Secondary | ICD-10-CM

## 2019-08-03 DIAGNOSIS — Z20822 Contact with and (suspected) exposure to covid-19: Secondary | ICD-10-CM | POA: Diagnosis not present

## 2019-08-03 DIAGNOSIS — Z7189 Other specified counseling: Secondary | ICD-10-CM | POA: Diagnosis not present

## 2019-08-03 DIAGNOSIS — R197 Diarrhea, unspecified: Secondary | ICD-10-CM

## 2019-08-03 DIAGNOSIS — N179 Acute kidney failure, unspecified: Secondary | ICD-10-CM | POA: Diagnosis not present

## 2019-08-03 DIAGNOSIS — F17210 Nicotine dependence, cigarettes, uncomplicated: Secondary | ICD-10-CM | POA: Diagnosis present

## 2019-08-03 DIAGNOSIS — K121 Other forms of stomatitis: Secondary | ICD-10-CM | POA: Diagnosis present

## 2019-08-03 DIAGNOSIS — D649 Anemia, unspecified: Secondary | ICD-10-CM

## 2019-08-03 DIAGNOSIS — D709 Neutropenia, unspecified: Secondary | ICD-10-CM | POA: Diagnosis not present

## 2019-08-03 DIAGNOSIS — I959 Hypotension, unspecified: Secondary | ICD-10-CM

## 2019-08-03 DIAGNOSIS — E43 Unspecified severe protein-calorie malnutrition: Secondary | ICD-10-CM | POA: Diagnosis not present

## 2019-08-03 DIAGNOSIS — J439 Emphysema, unspecified: Secondary | ICD-10-CM | POA: Diagnosis not present

## 2019-08-03 DIAGNOSIS — J189 Pneumonia, unspecified organism: Secondary | ICD-10-CM | POA: Diagnosis present

## 2019-08-03 DIAGNOSIS — R627 Adult failure to thrive: Secondary | ICD-10-CM | POA: Diagnosis present

## 2019-08-03 DIAGNOSIS — E860 Dehydration: Secondary | ICD-10-CM | POA: Diagnosis not present

## 2019-08-03 DIAGNOSIS — D701 Agranulocytosis secondary to cancer chemotherapy: Secondary | ICD-10-CM | POA: Diagnosis present

## 2019-08-03 DIAGNOSIS — E876 Hypokalemia: Secondary | ICD-10-CM | POA: Diagnosis present

## 2019-08-03 DIAGNOSIS — Z86711 Personal history of pulmonary embolism: Secondary | ICD-10-CM

## 2019-08-03 DIAGNOSIS — J440 Chronic obstructive pulmonary disease with acute lower respiratory infection: Secondary | ICD-10-CM | POA: Diagnosis present

## 2019-08-03 DIAGNOSIS — K1230 Oral mucositis (ulcerative), unspecified: Secondary | ICD-10-CM

## 2019-08-03 DIAGNOSIS — D61810 Antineoplastic chemotherapy induced pancytopenia: Principal | ICD-10-CM | POA: Diagnosis present

## 2019-08-03 DIAGNOSIS — Z681 Body mass index (BMI) 19 or less, adult: Secondary | ICD-10-CM

## 2019-08-03 DIAGNOSIS — Z7901 Long term (current) use of anticoagulants: Secondary | ICD-10-CM | POA: Diagnosis not present

## 2019-08-03 DIAGNOSIS — I4821 Permanent atrial fibrillation: Secondary | ICD-10-CM | POA: Diagnosis present

## 2019-08-03 DIAGNOSIS — Z79899 Other long term (current) drug therapy: Secondary | ICD-10-CM

## 2019-08-03 DIAGNOSIS — I4819 Other persistent atrial fibrillation: Secondary | ICD-10-CM | POA: Diagnosis present

## 2019-08-03 DIAGNOSIS — D696 Thrombocytopenia, unspecified: Secondary | ICD-10-CM

## 2019-08-03 DIAGNOSIS — T451X5A Adverse effect of antineoplastic and immunosuppressive drugs, initial encounter: Secondary | ICD-10-CM | POA: Diagnosis present

## 2019-08-03 LAB — COMPREHENSIVE METABOLIC PANEL
ALT: 12 U/L (ref 0–44)
AST: 31 U/L (ref 15–41)
Albumin: 2.7 g/dL — ABNORMAL LOW (ref 3.5–5.0)
Alkaline Phosphatase: 78 U/L (ref 38–126)
Anion gap: 15 (ref 5–15)
BUN: 17 mg/dL (ref 6–20)
CO2: 31 mmol/L (ref 22–32)
Calcium: 7.8 mg/dL — ABNORMAL LOW (ref 8.9–10.3)
Chloride: 88 mmol/L — ABNORMAL LOW (ref 98–111)
Creatinine, Ser: 1.29 mg/dL — ABNORMAL HIGH (ref 0.61–1.24)
GFR calc Af Amer: >60 mL/min (ref >60)
GFR calc non Af Amer: 60 mL/min — ABNORMAL LOW (ref >60)
Glucose, Bld: 139 mg/dL — ABNORMAL HIGH (ref 70–99)
Potassium: 3.5 mmol/L (ref 3.5–5.1)
Sodium: 134 mmol/L — ABNORMAL LOW (ref 135–145)
Total Bilirubin: 1.2 mg/dL (ref 0.3–1.2)
Total Protein: 7.7 g/dL (ref 6.5–8.1)

## 2019-08-03 LAB — CBC WITH DIFFERENTIAL/PLATELET
HCT: 24.4 % — ABNORMAL LOW (ref 39.0–52.0)
Hemoglobin: 7.7 g/dL — ABNORMAL LOW (ref 13.0–17.0)
MCH: 26.6 pg (ref 26.0–34.0)
MCHC: 31.6 g/dL (ref 30.0–36.0)
MCV: 84.1 fL (ref 80.0–100.0)
Platelets: 21 10*3/uL — CL (ref 150–400)
RBC: 2.9 MIL/uL — ABNORMAL LOW (ref 4.22–5.81)
RDW: 18.6 % — ABNORMAL HIGH (ref 11.5–15.5)
WBC: 0.4 10*3/uL — CL (ref 4.0–10.5)
nRBC: 0 % (ref 0.0–0.2)

## 2019-08-03 LAB — URINALYSIS, ROUTINE W REFLEX MICROSCOPIC
Bilirubin Urine: NEGATIVE
Glucose, UA: NEGATIVE mg/dL
Hgb urine dipstick: NEGATIVE
Ketones, ur: NEGATIVE mg/dL
Leukocytes,Ua: NEGATIVE
Nitrite: NEGATIVE
Protein, ur: 30 mg/dL — AB
Specific Gravity, Urine: 1.014 (ref 1.005–1.030)
pH: 5 (ref 5.0–8.0)

## 2019-08-03 LAB — LACTIC ACID, PLASMA
Lactic Acid, Venous: 2.6 mmol/L (ref 0.5–1.9)
Lactic Acid, Venous: 1.2 mmol/L (ref 0.5–1.9)

## 2019-08-03 LAB — POC SARS CORONAVIRUS 2 AG -  ED: SARS Coronavirus 2 Ag: NEGATIVE

## 2019-08-03 LAB — BRAIN NATRIURETIC PEPTIDE: B Natriuretic Peptide: 421 pg/mL — ABNORMAL HIGH (ref 0.0–100.0)

## 2019-08-03 LAB — TROPONIN I (HIGH SENSITIVITY): Troponin I (High Sensitivity): 12 ng/L (ref <18)

## 2019-08-03 LAB — PREPARE RBC (CROSSMATCH)

## 2019-08-03 LAB — D-DIMER, QUANTITATIVE: D-Dimer, Quant: 4.52 ug/mL-FEU — ABNORMAL HIGH (ref 0.00–0.50)

## 2019-08-03 MED ORDER — HEPARIN SOD (PORK) LOCK FLUSH 100 UNIT/ML IV SOLN: 250.0000 [IU] | INTRAVENOUS | Status: DC | PRN

## 2019-08-03 MED ORDER — HEPARIN SOD (PORK) LOCK FLUSH 100 UNIT/ML IV SOLN: 500.0000 [IU] | Freq: Every day | INTRAVENOUS | Status: DC | PRN

## 2019-08-03 MED ORDER — VANCOMYCIN HCL 750 MG/150ML IV SOLN
750.0000 mg | INTRAVENOUS | Status: DC
  Administered 2019-08-04: 750 mg via INTRAVENOUS
  Filled 2019-08-03 (×2): qty 150

## 2019-08-03 MED ORDER — FLUCONAZOLE IN SODIUM CHLORIDE 200-0.9 MG/100ML-% IV SOLN
200.0000 mg | Freq: Once | INTRAVENOUS | Status: DC
  Filled 2019-08-03: qty 100

## 2019-08-03 MED ORDER — POTASSIUM CHLORIDE 10 MEQ/100ML IV SOLN
10.0000 meq | INTRAVENOUS | Status: AC
  Administered 2019-08-03 (×4): 10 meq via INTRAVENOUS
  Filled 2019-08-03 (×4): qty 100

## 2019-08-03 MED ORDER — SODIUM CHLORIDE 0.9 % IV SOLN: Freq: Once | INTRAVENOUS | Status: AC

## 2019-08-03 MED ORDER — ACETAMINOPHEN 325 MG PO TABS
650.0000 mg | ORAL_TABLET | Freq: Once | ORAL | Status: AC
  Administered 2019-08-03: 650 mg via ORAL
  Filled 2019-08-03: qty 2

## 2019-08-03 MED ORDER — ACETAMINOPHEN 325 MG PO TABS: 650.0000 mg | ORAL_TABLET | Freq: Once | ORAL | Status: DC

## 2019-08-03 MED ORDER — ONDANSETRON HCL 4 MG/2ML IJ SOLN
4.0000 mg | Freq: Four times a day (QID) | INTRAMUSCULAR | Status: DC | PRN
  Filled 2019-08-03: qty 2

## 2019-08-03 MED ORDER — SODIUM CHLORIDE 0.9% IV SOLUTION: Freq: Once | INTRAVENOUS | Status: AC

## 2019-08-03 MED ORDER — VANCOMYCIN HCL IN DEXTROSE 1-5 GM/200ML-% IV SOLN
1000.0000 mg | Freq: Once | INTRAVENOUS | Status: AC
  Administered 2019-08-03: 16:00:00 1000 mg via INTRAVENOUS
  Filled 2019-08-03: qty 200

## 2019-08-03 MED ORDER — FLUCONAZOLE IN SODIUM CHLORIDE 200-0.9 MG/100ML-% IV SOLN
200.0000 mg | Freq: Once | INTRAVENOUS | Status: AC
  Administered 2019-08-03: 200 mg via INTRAVENOUS
  Filled 2019-08-03: qty 100

## 2019-08-03 MED ORDER — DIPHENHYDRAMINE HCL 25 MG PO CAPS
25.0000 mg | ORAL_CAPSULE | Freq: Once | ORAL | Status: AC
  Administered 2019-08-03: 25 mg via ORAL
  Filled 2019-08-03: qty 1

## 2019-08-03 MED ORDER — ONDANSETRON HCL 4 MG PO TABS: 4.0000 mg | ORAL_TABLET | Freq: Four times a day (QID) | ORAL | Status: DC | PRN

## 2019-08-03 MED ORDER — METOPROLOL TARTRATE 25 MG PO TABS
25.0000 mg | ORAL_TABLET | Freq: Once | ORAL | Status: AC
  Administered 2019-08-03: 25 mg via ORAL
  Filled 2019-08-03: qty 1

## 2019-08-03 MED ORDER — FOLIC ACID 1 MG PO TABS
1.0000 mg | ORAL_TABLET | Freq: Every day | ORAL | Status: DC
  Administered 2019-08-04 – 2019-08-10 (×7): 1 mg via ORAL
  Filled 2019-08-03 (×7): qty 1

## 2019-08-03 MED ORDER — FLUCONAZOLE 100MG IVPB
100.0000 mg | INTRAVENOUS | Status: DC
  Administered 2019-08-04: 100 mg via INTRAVENOUS
  Filled 2019-08-03 (×2): qty 50

## 2019-08-03 MED ORDER — ACETAMINOPHEN 325 MG PO TABS
650.0000 mg | ORAL_TABLET | Freq: Four times a day (QID) | ORAL | Status: DC | PRN
  Administered 2019-08-04 – 2019-08-08 (×5): 650 mg via ORAL
  Filled 2019-08-03 (×7): qty 2

## 2019-08-03 MED ORDER — FLUCONAZOLE 100MG IVPB: 100.0000 mg | INTRAVENOUS | Status: DC

## 2019-08-03 MED ORDER — METOPROLOL TARTRATE 5 MG/5ML IV SOLN
5.0000 mg | Freq: Four times a day (QID) | INTRAVENOUS | Status: DC | PRN
  Administered 2019-08-03: 5 mg via INTRAVENOUS
  Filled 2019-08-03 (×2): qty 5

## 2019-08-03 MED ORDER — SODIUM CHLORIDE 0.9% FLUSH: 10.0000 mL | INTRAVENOUS | Status: DC | PRN

## 2019-08-03 MED ORDER — SENNOSIDES-DOCUSATE SODIUM 8.6-50 MG PO TABS: 1.0000 | ORAL_TABLET | Freq: Every evening | ORAL | Status: DC | PRN

## 2019-08-03 MED ORDER — MAGIC MOUTHWASH
5.0000 mL | Freq: Four times a day (QID) | ORAL | Status: DC | PRN
  Filled 2019-08-03: qty 5

## 2019-08-03 MED ORDER — ACETAMINOPHEN 650 MG RE SUPP: 650.0000 mg | Freq: Four times a day (QID) | RECTAL | Status: DC | PRN

## 2019-08-03 MED ORDER — SODIUM CHLORIDE 0.9% FLUSH: 3.0000 mL | INTRAVENOUS | Status: DC | PRN

## 2019-08-03 MED ORDER — BISACODYL 10 MG RE SUPP: 10.0000 mg | Freq: Every day | RECTAL | Status: DC | PRN

## 2019-08-03 MED ORDER — SODIUM CHLORIDE 0.9% IV SOLUTION: 250.0000 mL | Freq: Once | INTRAVENOUS | Status: DC

## 2019-08-03 MED ORDER — SODIUM CHLORIDE 0.9 % IV SOLN
2.0000 g | Freq: Two times a day (BID) | INTRAVENOUS | Status: DC
  Administered 2019-08-04 – 2019-08-08 (×9): 2 g via INTRAVENOUS
  Filled 2019-08-03 (×9): qty 2

## 2019-08-03 MED ORDER — DIPHENHYDRAMINE HCL 25 MG PO CAPS: 25.0000 mg | ORAL_CAPSULE | Freq: Once | ORAL | Status: DC

## 2019-08-03 MED ORDER — RIVAROXABAN 20 MG PO TABS
20.0000 mg | ORAL_TABLET | Freq: Every day | ORAL | Status: DC
  Administered 2019-08-03: 20 mg via ORAL
  Filled 2019-08-03: qty 1

## 2019-08-03 MED ORDER — METRONIDAZOLE IN NACL 5-0.79 MG/ML-% IV SOLN: 500.0000 mg | Freq: Once | INTRAVENOUS | Status: DC

## 2019-08-03 MED ORDER — MORPHINE SULFATE 10 MG/5ML PO SOLN
10.0000 mg | ORAL | Status: DC | PRN
  Administered 2019-08-04: 10 mg via ORAL
  Filled 2019-08-03: qty 5

## 2019-08-03 MED ORDER — SODIUM CHLORIDE 0.9 % IV SOLN: 1.0000 g | Freq: Once | INTRAVENOUS | Status: DC

## 2019-08-03 MED ORDER — SODIUM CHLORIDE 0.9 % IV SOLN
2.0000 g | Freq: Once | INTRAVENOUS | Status: AC
  Administered 2019-08-03: 2 g via INTRAVENOUS
  Filled 2019-08-03: qty 2

## 2019-08-03 MED ORDER — MAGIC MOUTHWASH W/LIDOCAINE
5.0000 mL | Freq: Four times a day (QID) | ORAL | Status: DC | PRN
  Administered 2019-08-04 (×2): 5 mL via ORAL
  Filled 2019-08-03 (×3): qty 5

## 2019-08-03 MED ORDER — SODIUM CHLORIDE 0.9 % IV SOLN: Freq: Once | INTRAVENOUS | Status: DC

## 2019-08-03 MED ORDER — SODIUM CHLORIDE 0.9% IV SOLUTION: 250.0000 mL | Freq: Once | INTRAVENOUS | Status: AC

## 2019-08-03 MED ORDER — METOPROLOL TARTRATE 25 MG PO TABS
25.0000 mg | ORAL_TABLET | Freq: Two times a day (BID) | ORAL | Status: DC
  Administered 2019-08-03 – 2019-08-10 (×14): 25 mg via ORAL
  Filled 2019-08-03 (×14): qty 1

## 2019-08-03 NOTE — ED Provider Notes (Signed)
[facility] DEPT Provider Note   CSN: [id number] Arrival date & time: 08/03/19  1055     History Chief Complaint  Patient presents with  . Shortness of Breath    REMBERTO LIENHARD is a 59 y.o. male.  59 yo M with a chief complaints of cough congestion fever chills myalgias.  Going on for about 3 days.  Patient went to his oncology clinic today and found to be hypoxic was placed on oxygen and sent him to the ED for evaluation.  Patient denies any chest pain denies abdominal pain had some diarrhea today.  The history is provided by the patient.  Illness Severity:  Moderate Onset quality:  Gradual Duration:  3 days Timing:  Constant Progression:  Worsening Chronicity:  New Associated symptoms: cough, fatigue, fever, myalgias and shortness of breath   Associated symptoms: no abdominal pain, no chest pain, no congestion, no diarrhea, no headaches, no rash and no vomiting        Past Medical History:  Diagnosis Date  . Alcohol abuse   . Chronic back pain   . COPD (chronic obstructive pulmonary disease) ([place])   . Dyspnea   . Lung mass    mediastinal adenopathy  . Lung nodule   . Multiple pulmonary nodules 04/02/2018  . Pneumonia   . Right lower lobe pulmonary nodule 04/02/2018   12/01/2017: PET SUV 2.1  . Tobacco abuse     Patient Active Problem List   Diagnosis Date Noted  . Febrile neutropenia ([place]) 08/03/2019  . Persistent atrial fibrillation ([place]) 08/03/2019  . History of pulmonary embolism 08/03/2019  . Mucositis 07/31/2019  . Adenocarcinoma of right lung, stage 4 ([place]) 05/15/2019  . Encounter for antineoplastic immunotherapy 05/15/2019  . Malignant neoplasm of right lung ([place])   . AF (paroxysmal atrial fibrillation) ([place]) 04/27/2019  . Atrial fibrillation with RVR ([place]) 04/25/2019  . Malnutrition of moderate degree 04/21/2019  . Aspiration pneumonia ([place]) 04/19/2019  . PNA (pneumonia) 04/19/2019  . Loculated pleural effusion   .  Acute pulmonary embolism ([place]) 04/07/2019  . Pulmonary embolism ([place]) 04/06/2019  . Anemia 04/06/2019  . Hypokalemia 04/06/2019  . Malignant neoplasm of bronchus of right lower lobe ([place]) 03/01/2019  . Adenocarcinoma of right lung, stage 3 ([place]) 01/17/2019  . Goals of care, counseling/discussion 01/17/2019  . Encounter for antineoplastic chemotherapy 01/17/2019  . Tobacco abuse 01/10/2019  . Mediastinal adenopathy 01/10/2019  . Right upper lobe pulmonary nodule 04/19/2018  . Multiple pulmonary nodules 04/02/2018  . Right lower lobe pulmonary nodule 04/02/2018  . Protein-calorie malnutrition, severe 08/09/2017  . Acute lung injury   . Pneumonitis   . BOOP (bronchiolitis obliterans with organizing pneumonia) ([place])   . Acute respiratory failure with hypoxia ([place]) 08/08/2017  . Hypoxia 08/07/2017  . Dehydration   . Diarrhea   . CAP (community acquired pneumonia) 08/04/2017  . Hyponatremia 08/04/2017  . Tachycardia 08/04/2017  . Influenza A     Past Surgical History:  Procedure Laterality Date  . APPENDECTOMY    . VIDEO BRONCHOSCOPY WITH ENDOBRONCHIAL NAVIGATION N/A 04/05/2018   Procedure: VIDEO BRONCHOSCOPY WITH ENDOBRONCHIAL NAVIGATION;  Surgeon: Garner Nash, DO;  Location: [facility];  Service: Thoracic;  Laterality: N/A;  . VIDEO BRONCHOSCOPY WITH ENDOBRONCHIAL NAVIGATION N/A 01/11/2019   Procedure: VIDEO BRONCHOSCOPY WITH ENDOBRONCHIAL NAVIGATION;  Surgeon: Garner Nash, DO;  Location: [facility];  Service: Thoracic;  Laterality: N/A;  . VIDEO BRONCHOSCOPY WITH ENDOBRONCHIAL ULTRASOUND N/A 01/11/2019   Procedure: VIDEO BRONCHOSCOPY WITH  ENDOBRONCHIAL ULTRASOUND;  Surgeon: Garner Nash, DO;  Location: [facility];  Service: Thoracic;  Laterality: N/A;  . VIDEO BRONCHOSCOPY WITH RADIAL ENDOBRONCHIAL ULTRASOUND N/A 01/11/2019   Procedure: VIDEO BRONCHOSCOPY WITH RADIAL ENDOBRONCHIAL ULTRASOUND;  Surgeon: Garner Nash, DO;  Location: [facility] OR;  Service: Thoracic;  Laterality: N/A;  . WISDOM  TOOTH EXTRACTION         Family History  Problem Relation Age of Onset  . Heart attack Maternal Grandfather     Social History   Tobacco Use  . Smoking status: Current Every Day Smoker    Packs/day: 0.25    Years: 40.00    Pack years: 10.00    Types: Cigarettes  . Smokeless tobacco: Never Used  Substance Use Topics  . Alcohol use: Not Currently  . Drug use: Not Currently    Types: Marijuana    Home Medications Prior to Admission medications   Medication Sig Start Date End Date Taking? Authorizing Provider  albuterol (PROVENTIL HFA;VENTOLIN HFA) 108 (90 Base) MCG/ACT inhaler Inhale 2 puffs into the lungs every 6 (six) hours as needed for wheezing or shortness of breath. 06/27/18  Yes Icard, Octavio Graves, DO  feeding supplement, ENSURE ENLIVE, (ENSURE ENLIVE) LIQD Take 237 mLs by mouth 3 (three) times daily between meals. Patient taking differently: Take 237 mLs by mouth daily.  08/11/17  Yes Ghimire, Henreitta Leber, MD  folic acid (FOLVITE) 1 MG tablet Take 1 tablet (1 mg total) by mouth daily. 05/15/19  Yes [name], MD  magic mouthwash SOLN Take 5 mLs by mouth 4 (four) times daily as needed for mouth pain. 07/31/19  Yes Heilingoetter, Cassandra L, PA-C  magnesium oxide (MAG-OX) 400 (241.3 Mg) MG tablet Take 1 tablet (400 mg total) by mouth 2 (two) times daily. 05/15/19  Yes [name], MD  oxyCODONE-acetaminophen (PERCOCET/ROXICET) 5-325 MG tablet Take 1 tablet by mouth every 8 (eight) hours as needed for severe pain. 07/03/19  Yes [name], MD  potassium chloride SA (KLOR-CON) 20 MEQ tablet Take 1 tablet (20 mEq total) by mouth 2 (two) times daily. 07/31/19  Yes Heilingoetter, Cassandra L, PA-C  rivaroxaban (XARELTO) 20 MG TABS tablet Take 1 tablet (20 mg total) by mouth daily with supper. 07/26/19  Yes Heilingoetter, Cassandra L, PA-C  albuterol (PROVENTIL) (2.5 MG/3ML) 0.083% nebulizer solution Take 2.5 mg by nebulization every 6 (six) hours as needed. 05/18/19    [provider]  ciprofloxacin (CIPRO) 500 MG tablet Take 1 tablet (500 mg total) by mouth 2 (two) times daily. Patient not taking: Reported on 07/31/2019 07/23/19   Heilingoetter, Cassandra L, PA-C  lidocaine (XYLOCAINE) 2 % solution 5 ml swallow before meals for radiation esophagitis Patient not taking: Reported on 07/31/2019 03/30/19   [name], MD  polyethylene glycol (MIRALAX / GLYCOLAX) 17 g packet Take 17 g by mouth daily. Patient not taking: Reported on 07/31/2019 05/03/19   Modena Jansky, MD  prochlorperazine (COMPAZINE) 10 MG tablet Take 1 tablet (10 mg total) by mouth every 6 (six) hours as needed for nausea or vomiting. Patient not taking: Reported on 07/31/2019 05/15/19   [name], MD  SENNA-PLUS 8.6-50 MG tablet Take 1-2 tablets by mouth daily. Patient not taking: Reported on 07/31/2019 06/12/19   Heilingoetter, Cassandra L, PA-C  tiotropium (SPIRIVA HANDIHALER) 18 MCG inhalation capsule Place 1 capsule (18 mcg total) into inhaler and inhale daily. Patient not taking: Reported on 07/31/2019 06/27/18 01/05/20  Garner Nash, DO    Allergies  Patient has no known allergies.  Review of Systems   Review of Systems  Constitutional: Positive for chills, fatigue and fever.  HENT: Negative for congestion and facial swelling.   Eyes: Negative for discharge and visual disturbance.  Respiratory: Positive for cough and shortness of breath.   Cardiovascular: Negative for chest pain and palpitations.  Gastrointestinal: Negative for abdominal pain, diarrhea and vomiting.  Musculoskeletal: Positive for myalgias. Negative for arthralgias.  Skin: Negative for color change and rash.  Neurological: Negative for tremors, syncope and headaches.  Psychiatric/Behavioral: Negative for confusion and dysphoric mood.    Physical Exam Updated Vital Signs BP 120/80   Pulse 79   Temp 98.4 F (36.9 C)   Resp (!) 23   Ht 6\' 2"  (1.88 m)   Wt 51.3 kg   SpO2 98%   BMI 14.51  kg/m   Physical Exam Vitals and nursing note reviewed.  Constitutional:      Appearance: He is well-developed.     Comments: Chronically ill-appearing.  Cachectic  HENT:     Head: Normocephalic and atraumatic.  Eyes:     Pupils: Pupils are equal, round, and reactive to light.  Neck:     Vascular: No JVD.  Cardiovascular:     Rate and Rhythm: Normal rate and regular rhythm.     Heart sounds: No murmur. No friction rub. No gallop.   Pulmonary:     Effort: No respiratory distress.     Breath sounds: No wheezing.     Comments: Diminished lung sounds in all fields. Abdominal:     General: There is no distension.     Tenderness: There is no guarding or rebound.  Musculoskeletal:        General: Normal range of motion.     Cervical back: Normal range of motion and neck supple.  Skin:    Coloration: Skin is not pale.     Findings: No rash.  Neurological:     Mental Status: He is alert and oriented to person, place, and time.  Psychiatric:        Behavior: Behavior normal.     ED Results / Procedures / Treatments   Labs (all labs ordered are listed, but only abnormal results are displayed) Labs Reviewed  CBC WITH DIFFERENTIAL/PLATELET - Abnormal; Notable for the following components:      Result Value   WBC 0.4 (*)    RBC 2.90 (*)    Hemoglobin 7.7 (*)    HCT 24.4 (*)    RDW 18.6 (*)    Platelets 21 (*)    All other components within normal limits  COMPREHENSIVE METABOLIC PANEL - Abnormal; Notable for the following components:   Sodium 134 (*)    Chloride 88 (*)    Glucose, Bld 139 (*)    Creatinine, Ser 1.29 (*)    Calcium 7.8 (*)    Albumin 2.7 (*)    GFR calc non Af Amer 60 (*)    All other components within normal limits  BRAIN NATRIURETIC PEPTIDE - Abnormal; Notable for the following components:   B Natriuretic Peptide 421.0 (*)    All other components within normal limits  CULTURE, BLOOD (ROUTINE X 2)  CULTURE, BLOOD (ROUTINE X 2)  URINE CULTURE  SARS  CORONAVIRUS 2 (TAT 6-24 HRS)  MRSA PCR SCREENING  URINALYSIS, ROUTINE W REFLEX MICROSCOPIC  LACTIC ACID, PLASMA  LACTIC ACID, PLASMA  D-DIMER, QUANTITATIVE (NOT AT [facility])  POC SARS CORONAVIRUS 2 AG -  ED  PREPARE RBC (CROSSMATCH)  PREPARE RBC (CROSSMATCH)  PREPARE RBC (CROSSMATCH)  TROPONIN I (HIGH SENSITIVITY)    EKG EKG Interpretation  Date/Time:  Friday August 03 2019 11:47:42 EST Ventricular Rate:  170 PR Interval:    QRS Duration: 81 QT Interval:  255 QTC Calculation: 402 R Axis:   93 Text Interpretation: Atrial fibrillation with rapid V-rate Paired ventricular premature complexes Right axis deviation Borderline low voltage, extremity leads Probable anterolateral infarct, old Minimal ST depression, inferior leads Nonspecific T abnormalities, inferior leads TECHNICALLY DIFFICULT Otherwise no significant change Confirmed by Deno Etienne [phone]) on 08/03/2019 12:51:14 PM   Radiology DG Chest Port 1 View  Result Date: 08/03/2019 CLINICAL DATA:  Shortness of breath.  Hypoxia. EXAM: PORTABLE CHEST 1 VIEW COMPARISON:  07/31/2019.  CT 07/23/2019. FINDINGS: Mediastinum hilar structures normal. Persistent atelectasis and infiltrate right lung base. Persistent cavitary changes right lung base. Persistent right-sided pleural effusion. Similar findings noted on prior exam. Symmetric single nodular opacities noted over the chest bilaterally consistent nipple shadows. Stable cardiomegaly. No acute bony abnormality. IMPRESSION: Persistent right base atelectasis and infiltrate. Persistent cavitary changes right lung base. Persistent right-sided pleural effusion. No significant interim change from prior exam. Electronically Signed   By: Devon   On: 08/03/2019 11:22    Procedures Procedures (including critical care time)   "The cardiac monitor revealed normal sinus rhythm as interpreted by me. The cardiac monitor was ordered secondary to the patient's history of infection and to monitor  the patient for dysrhythmia."   Medications Ordered in ED Medications  vancomycin (VANCOCIN) IVPB 1000 mg/200 mL premix (has no administration in time range)  ceFEPIme (MAXIPIME) 2 g in sodium chloride 0.9 % 100 mL IVPB (has no administration in time range)  0.9 %  sodium chloride infusion (Manually program via Guardrails IV Fluids) (has no administration in time range)  morphine CONCENTRATE 10 mg / 0.5 ml oral solution 10 mg (has no administration in time range)  metoprolol tartrate (LOPRESSOR) tablet 25 mg (has no administration in time range)  magic mouthwash (has no administration in time range)  rivaroxaban (XARELTO) tablet 20 mg (has no administration in time range)  folic acid (FOLVITE) tablet 1 mg (has no administration in time range)  acetaminophen (TYLENOL) tablet 650 mg (has no administration in time range)    Or  acetaminophen (TYLENOL) suppository 650 mg (has no administration in time range)  senna-docusate (Senokot-S) tablet 1 tablet (has no administration in time range)  bisacodyl (DULCOLAX) suppository 10 mg (has no administration in time range)  ondansetron (ZOFRAN) tablet 4 mg (has no administration in time range)    Or  ondansetron (ZOFRAN) injection 4 mg (has no administration in time range)  metoprolol tartrate (LOPRESSOR) injection 5 mg (has no administration in time range)  fluconazole (DIFLUCAN) IVPB 200 mg (has no administration in time range)    Followed by  fluconazole (DIFLUCAN) IVPB 100 mg (has no administration in time range)  sodium chloride 0.9 % 1,000 mL with potassium chloride 40 mEq infusion (has no administration in time range)  0.9 %  sodium chloride infusion (has no administration in time range)  0.9 %  sodium chloride infusion (Manually program via Guardrails IV Fluids) (has no administration in time range)  acetaminophen (TYLENOL) tablet 650 mg (has no administration in time range)  diphenhydrAMINE (BENADRYL) capsule 25 mg (has no administration in  time range)  heparin lock flush 100 unit/mL (has no administration in time range)  heparin lock flush 100 unit/mL (has  no administration in time range)  sodium chloride flush (NS) 0.9 % injection 10 mL (has no administration in time range)  sodium chloride flush (NS) 0.9 % injection 3 mL (has no administration in time range)  0.9 %  sodium chloride infusion (Manually program via Guardrails IV Fluids) (has no administration in time range)  acetaminophen (TYLENOL) tablet 650 mg (has no administration in time range)  diphenhydrAMINE (BENADRYL) capsule 25 mg (has no administration in time range)  heparin lock flush 100 unit/mL (has no administration in time range)  heparin lock flush 100 unit/mL (has no administration in time range)  sodium chloride flush (NS) 0.9 % injection 10 mL (has no administration in time range)  sodium chloride flush (NS) 0.9 % injection 3 mL (has no administration in time range)    ED Course  I have reviewed the triage vital signs and the nursing notes.  Pertinent labs & imaging results that were available during my care of the patient were reviewed by me and considered in my medical decision making (see chart for details).    MDM Rules/Calculators/A&P                      59 yo M with a history of adenocarcinoma undergoing palliative chemotherapy for stage IV disease.  Patient with cough fevers chills myalgias over 72 hours.  Went to the oncology clinic and was requiring oxygen was sent down to the ED for evaluation.  As this is occurring in the middle of the coronavirus pandemic concern for Covid infection.  We will send a rapid test.  Portable chest x-ray.  Labs.  Reassess.  Patient found to be profoundly leukopenic.  White blood cell count is 0.4.  There was not enough cells to even measure an absolute neutrophil count.  We will assume that he is neutropenic.  Started on broad-spectrum antibiotics, likely pneumonia as source.  He also is pancytopenic with a  hemoglobin of 7.7.  Will transfuse 1 unit of blood.  Platelets 21.  CRITICAL CARE Performed by: Cecilio Asper   Total critical care time: 35 minutes  Critical care time was exclusive of separately billable procedures and treating other patients.  Critical care was necessary to treat or prevent imminent or life-threatening deterioration.  Critical care was time spent personally by me on the following activities: development of treatment plan with patient and/or surrogate as well as nursing, discussions with consultants, evaluation of patient's response to treatment, examination of patient, obtaining history from patient or surrogate, ordering and performing treatments and interventions, ordering and review of laboratory studies, ordering and review of radiographic studies, pulse oximetry and re-evaluation of patient's condition.   The patients results and plan were reviewed and discussed.   Any x-rays performed were independently reviewed by myself.   Differential diagnosis were considered with the presenting HPI.  Medications  vancomycin (VANCOCIN) IVPB 1000 mg/200 mL premix (has no administration in time range)  ceFEPIme (MAXIPIME) 2 g in sodium chloride 0.9 % 100 mL IVPB (has no administration in time range)  0.9 %  sodium chloride infusion (Manually program via Guardrails IV Fluids) (has no administration in time range)  morphine CONCENTRATE 10 mg / 0.5 ml oral solution 10 mg (has no administration in time range)  metoprolol tartrate (LOPRESSOR) tablet 25 mg (has no administration in time range)  magic mouthwash (has no administration in time range)  rivaroxaban (XARELTO) tablet 20 mg (has no administration in time range)  folic acid (FOLVITE)  tablet 1 mg (has no administration in time range)  acetaminophen (TYLENOL) tablet 650 mg (has no administration in time range)    Or  acetaminophen (TYLENOL) suppository 650 mg (has no administration in time range)  senna-docusate  (Senokot-S) tablet 1 tablet (has no administration in time range)  bisacodyl (DULCOLAX) suppository 10 mg (has no administration in time range)  ondansetron (ZOFRAN) tablet 4 mg (has no administration in time range)    Or  ondansetron (ZOFRAN) injection 4 mg (has no administration in time range)  metoprolol tartrate (LOPRESSOR) injection 5 mg (has no administration in time range)  fluconazole (DIFLUCAN) IVPB 200 mg (has no administration in time range)    Followed by  fluconazole (DIFLUCAN) IVPB 100 mg (has no administration in time range)  sodium chloride 0.9 % 1,000 mL with potassium chloride 40 mEq infusion (has no administration in time range)  0.9 %  sodium chloride infusion (has no administration in time range)  0.9 %  sodium chloride infusion (Manually program via Guardrails IV Fluids) (has no administration in time range)  acetaminophen (TYLENOL) tablet 650 mg (has no administration in time range)  diphenhydrAMINE (BENADRYL) capsule 25 mg (has no administration in time range)  heparin lock flush 100 unit/mL (has no administration in time range)  heparin lock flush 100 unit/mL (has no administration in time range)  sodium chloride flush (NS) 0.9 % injection 10 mL (has no administration in time range)  sodium chloride flush (NS) 0.9 % injection 3 mL (has no administration in time range)  0.9 %  sodium chloride infusion (Manually program via Guardrails IV Fluids) (has no administration in time range)  acetaminophen (TYLENOL) tablet 650 mg (has no administration in time range)  diphenhydrAMINE (BENADRYL) capsule 25 mg (has no administration in time range)  heparin lock flush 100 unit/mL (has no administration in time range)  heparin lock flush 100 unit/mL (has no administration in time range)  sodium chloride flush (NS) 0.9 % injection 10 mL (has no administration in time range)  sodium chloride flush (NS) 0.9 % injection 3 mL (has no administration in time range)    Vitals:    08/03/19 1134 08/03/19 1135 08/03/19 1300  BP:   120/80  Pulse: 79    Resp: 18  (!) 23  Temp: 98.4 F (36.9 C)    SpO2: 98%    Weight:  51.3 kg   Height:  6\' 2"  (1.88 m)     Final diagnoses:  Neutropenic fever (HCC)    Admission/ observation were discussed with the admitting physician, patient and/or family and they are comfortable with the plan.   Final Clinical Impression(s) / ED Diagnoses Final diagnoses:  Neutropenic fever [facility])    Rx / DC Orders ED Discharge Orders    None       Deno Etienne, DO 08/03/19 1537

## 2019-08-03 NOTE — Progress Notes (Signed)
Pharmacy Antibiotic Note  Tyler [name] is a 59 y.o. male admitted on 08/03/2019 with FN, esophageal candidiasis.  Pharmacy has been consulted for Vancomycin, Cefepime and Fluconazole dosing.  Plan: Vancomycin 1gm x1, then 750mg  q24, AUC 494, SCr 1.29, using TBW Cefepime 2gm q12 Fluconazole 200mg  x1, then 100mg  q24  Height: 6\' 2"  (188 cm) Weight: 113 lb (51.3 kg) IBW/kg (Calculated) : 82.2  Temp (24hrs), Avg:98.4 F (36.9 C), Min:98.4 F (36.9 C), Max:98.4 F (36.9 C)  Recent Labs  Lab 07/31/19 1125 07/31/19 1433 08/03/19 1204  WBC 1.8* 1.8* 0.4*  CREATININE 1.01 1.13 1.29*    Estimated Creatinine Clearance: 44.2 mL/min (A) (by C-G formula based on SCr of 1.29 mg/dL (H)).    No Known Allergies  Antimicrobials this admission: 1/22 Flagyl x1 1/22 Cefepime >>  1/22 Vancomycin >>  1/22 Fluconazole >>  Dose adjustments this admission:  Microbiology results: 1/22 BCx: sent 1/22 Covid: neg UCx ordered  Thank you for allowing pharmacy to be a part of this patient's care.  Minda Ditto PharmD 08/03/2019 2:25 PM

## 2019-08-03 NOTE — H&P (Addendum)
History and Physical    Tyler [name] EHU:[id number] DOB: Nov 13, 1960 DOA: 08/03/2019  PCP: Brand Males, MD  Patient coming from: Home  I have personally briefly reviewed patient's old medical records in [place]  Chief Complaint: Shortness of breath, weakness, cough, fever  HPI: Tyler [name] is a 59 y.o. male with medical history significant of permanent atrial fibrillation, stage IV non-small cell lung cancer/adenocarcinoma on current chemotherapy, history of pulmonary embolism, EtOH abuse, tobacco abuse who presents from oncology clinic with cough, fever, progressive shortness of breath.  Patient reports onset roughly 2-3 days ago with associated dizziness, fatigue, weakness, especially when standing.  No other specific complaints at this time.  Denies headache, no chest pain, no palpitations, no abdominal pain.   ED Course: Temperature 98.4, HR 79, RR 23, BP 120/80, SPO2 98% on 2 L nasal cannula.  WBC count 0.4, hemoglobin 7.7, platelet 21.  BNP 421.  Sodium 134, potassium 3.5, chloride 88, CO2 31, BUN 17, creatinine 1.29, glucose 139.  Rapid Covid-19 negative.  Chest x-ray with persistent right base atelectasis versus infiltrate with right pleural effusion.  Patient was started on vancomycin, cefepime, Flagyl for neutropenic fever.  Given hypoxia, shortness of breath, neutropenic fevers, ED physician requested admission for further evaluation work-up.  Review of Systems: As per HPI otherwise 10 point review of systems negative.    Past Medical History:  Diagnosis Date  . Alcohol abuse   . Chronic back pain   . COPD (chronic obstructive pulmonary disease) ([place])   . Dyspnea   . Lung mass    mediastinal adenopathy  . Lung nodule   . Multiple pulmonary nodules 04/02/2018  . Pneumonia   . Right lower lobe pulmonary nodule 04/02/2018   12/01/2017: PET SUV 2.1  . Tobacco abuse     Past Surgical History:  Procedure Laterality Date  . APPENDECTOMY    . VIDEO  BRONCHOSCOPY WITH ENDOBRONCHIAL NAVIGATION N/A 04/05/2018   Procedure: VIDEO BRONCHOSCOPY WITH ENDOBRONCHIAL NAVIGATION;  Surgeon: Garner Nash, DO;  Location: [facility];  Service: Thoracic;  Laterality: N/A;  . VIDEO BRONCHOSCOPY WITH ENDOBRONCHIAL NAVIGATION N/A 01/11/2019   Procedure: VIDEO BRONCHOSCOPY WITH ENDOBRONCHIAL NAVIGATION;  Surgeon: Garner Nash, DO;  Location: [facility];  Service: Thoracic;  Laterality: N/A;  . VIDEO BRONCHOSCOPY WITH ENDOBRONCHIAL ULTRASOUND N/A 01/11/2019   Procedure: VIDEO BRONCHOSCOPY WITH ENDOBRONCHIAL ULTRASOUND;  Surgeon: Garner Nash, DO;  Location: [facility];  Service: Thoracic;  Laterality: N/A;  . VIDEO BRONCHOSCOPY WITH RADIAL ENDOBRONCHIAL ULTRASOUND N/A 01/11/2019   Procedure: VIDEO BRONCHOSCOPY WITH RADIAL ENDOBRONCHIAL ULTRASOUND;  Surgeon: Garner Nash, DO;  Location: [facility];  Service: Thoracic;  Laterality: N/A;  . WISDOM TOOTH EXTRACTION       reports that he has been smoking cigarettes. He has a 10.00 pack-year smoking history. He has never used smokeless tobacco. He reports previous alcohol use. He reports previous drug use. Drug: Marijuana.  No Known Allergies  Family History  Problem Relation Age of Onset  . Heart attack Maternal Grandfather     Family history reviewed and not pertinent   Prior to Admission medications   Medication Sig Start Date End Date Taking? Authorizing Provider  albuterol (PROVENTIL HFA;VENTOLIN HFA) 108 (90 Base) MCG/ACT inhaler Inhale 2 puffs into the lungs every 6 (six) hours as needed for wheezing or shortness of breath. 06/27/18   Icard, Leory Plowman L, DO  albuterol (PROVENTIL) (2.5 MG/3ML) 0.083% nebulizer solution Take 2.5 mg by nebulization every 6 (six) hours  as needed. 05/18/19   [provider]  ciprofloxacin (CIPRO) 500 MG tablet Take 1 tablet (500 mg total) by mouth 2 (two) times daily. Patient not taking: Reported on 07/31/2019 07/23/19   Heilingoetter, Cassandra L, PA-C  feeding supplement, ENSURE  ENLIVE, (ENSURE ENLIVE) LIQD Take 237 mLs by mouth 3 (three) times daily between meals. Patient taking differently: Take 237 mLs by mouth daily.  08/11/17   Ghimire, Henreitta Leber, MD  folic acid (FOLVITE) 1 MG tablet Take 1 tablet (1 mg total) by mouth daily. 05/15/19   [name], MD  lidocaine (XYLOCAINE) 2 % solution 5 ml swallow before meals for radiation esophagitis Patient not taking: Reported on 07/31/2019 03/30/19   [name], MD  magic mouthwash SOLN Take 5 mLs by mouth 4 (four) times daily as needed for mouth pain. 07/31/19   Heilingoetter, Cassandra L, PA-C  magnesium oxide (MAG-OX) 400 (241.3 Mg) MG tablet Take 1 tablet (400 mg total) by mouth 2 (two) times daily. 05/15/19   [name], MD  metoprolol tartrate (LOPRESSOR) 25 MG tablet Take 1 tablet (25 mg total) by mouth 2 (two) times daily. 05/02/19   Hongalgi, Lenis Dickinson, MD  Morphine Sulfate (MORPHINE CONCENTRATE) 10 mg / 0.5 ml concentrated solution Take 0.5 mLs by mouth every 4 (four) hours as needed. 05/08/19   [provider]  oxyCODONE-acetaminophen (PERCOCET/ROXICET) 5-325 MG tablet Take 1 tablet by mouth every 8 (eight) hours as needed for severe pain. 07/03/19   [name], MD  polyethylene glycol (MIRALAX / GLYCOLAX) 17 g packet Take 17 g by mouth daily. Patient not taking: Reported on 07/31/2019 05/03/19   Modena Jansky, MD  potassium chloride SA (KLOR-CON) 20 MEQ tablet Take 1 tablet (20 mEq total) by mouth 2 (two) times daily. 07/31/19   Heilingoetter, Cassandra L, PA-C  prochlorperazine (COMPAZINE) 10 MG tablet Take 1 tablet (10 mg total) by mouth every 6 (six) hours as needed for nausea or vomiting. Patient not taking: Reported on 07/31/2019 05/15/19   [name], MD  rivaroxaban (XARELTO) 20 MG TABS tablet Take 1 tablet (20 mg total) by mouth daily with supper. 07/26/19   Heilingoetter, Cassandra L, PA-C  SENNA-PLUS 8.6-50 MG tablet Take 1-2 tablets by mouth daily. Patient not taking: Reported  on 07/31/2019 06/12/19   Heilingoetter, Cassandra L, PA-C  tiotropium (SPIRIVA HANDIHALER) 18 MCG inhalation capsule Place 1 capsule (18 mcg total) into inhaler and inhale daily. Patient not taking: Reported on 07/31/2019 06/27/18 01/05/20  June Leap L, DO  umeclidinium bromide (INCRUSE ELLIPTA) 62.5 MCG/INH AEPB Inhale 1 puff into the lungs daily.    [provider]    Physical Exam: Vitals:   08/03/19 1134 08/03/19 1135 08/03/19 1300  BP:   120/80  Pulse: 79    Resp: 18  (!) 23  Temp: 98.4 F (36.9 C)    SpO2: 98%    Weight:  51.3 kg   Height:  6\' 2"  (1.88 m)     Constitutional: NAD, calm, comfortable, thin and cachectic in appearance Eyes: PERRL, lids and conjunctivae normal ENMT: Mucous membranes are dry. Posterior pharynx clear of any exudate or lesions.Normal dentition.  Neck: normal, supple, no masses, no thyromegaly Respiratory: Lung sounds slightly decreased right base, otherwise clear to auscultation bilaterally, no wheezing, no crackles. Normal respiratory effort. No accessory muscle use.  On 2 L nasal cannula (not on home O2) Cardiovascular: Regular rate and rhythm, no murmurs / rubs / gallops. No extremity edema. 2+ pedal pulses. No carotid  bruits.  Abdomen: no tenderness, no masses palpated. No hepatosplenomegaly. Bowel sounds positive.  Musculoskeletal: no clubbing / cyanosis. No joint deformity upper and lower extremities. Good ROM, no contractures. Normal muscle tone.  Skin: no rashes, lesions, ulcers. No induration Neurologic: CN 2-12 grossly intact. Sensation intact, DTR normal. Strength 5/5 in all 4.  Psychiatric: Normal judgment and insight. Alert and oriented x 3. Normal mood.    Labs on Admission: I have personally reviewed following labs and imaging studies  CBC: Recent Labs  Lab 07/31/19 1125 07/31/19 1433 08/03/19 1204  WBC 1.8* 1.8* 0.4*  NEUTROABS 1.2* 1.3*  --   HGB 8.2* 9.3* 7.7*  HCT 25.2* 29.1* 24.4*  MCV 80.8 82.7 84.1  PLT 88*  95* 21*   Basic Metabolic Panel: Recent Labs  Lab 07/31/19 1125 07/31/19 1433 08/03/19 1204  NA 136 137 134*  K 2.7* 2.7* 3.5  CL 89* 88* 88*  CO2 34* 36* 31  GLUCOSE 129* 88 139*  BUN 14 16 17   CREATININE 1.01 1.13 1.29*  CALCIUM 7.6* 8.3* 7.8*   GFR: Estimated Creatinine Clearance: 44.2 mL/min (A) (by C-G formula based on SCr of 1.29 mg/dL (H)). Liver Function Tests: Recent Labs  Lab 07/31/19 1125 08/03/19 1204  AST 26 31  ALT 11 12  ALKPHOS 99 78  BILITOT 0.6 1.2  PROT 7.2 7.7  ALBUMIN 2.4* 2.7*   No results for input(s): LIPASE, AMYLASE in the last 168 hours. No results for input(s): AMMONIA in the last 168 hours. Coagulation Profile: No results for input(s): INR, PROTIME in the last 168 hours. Cardiac Enzymes: No results for input(s): CKTOTAL, CKMB, CKMBINDEX, TROPONINI in the last 168 hours. BNP (last 3 results) No results for input(s): PROBNP in the last 8760 hours. HbA1C: No results for input(s): HGBA1C in the last 72 hours. CBG: No results for input(s): GLUCAP in the last 168 hours. Lipid Profile: No results for input(s): CHOL, HDL, LDLCALC, TRIG, CHOLHDL, LDLDIRECT in the last 72 hours. Thyroid Function Tests: No results for input(s): TSH, T4TOTAL, FREET4, T3FREE, THYROIDAB in the last 72 hours. Anemia Panel: No results for input(s): VITAMINB12, FOLATE, FERRITIN, TIBC, IRON, RETICCTPCT in the last 72 hours. Urine analysis:    Component Value Date/Time   COLORURINE YELLOW 07/24/2019 1600   APPEARANCEUR CLEAR 07/24/2019 1600   LABSPEC 1.013 07/24/2019 1600   PHURINE 6.0 07/24/2019 1600   GLUCOSEU NEGATIVE 07/24/2019 1600   HGBUR NEGATIVE 07/24/2019 1600   BILIRUBINUR MODERATE (A) 07/24/2019 1600   KETONESUR NEGATIVE 07/24/2019 1600   PROTEINUR NEGATIVE 07/24/2019 1600   NITRITE NEGATIVE 07/24/2019 1600   LEUKOCYTESUR NEGATIVE 07/24/2019 1600    Radiological Exams on Admission: DG Chest Port 1 View  Result Date: 08/03/2019 CLINICAL DATA:   Shortness of breath.  Hypoxia. EXAM: PORTABLE CHEST 1 VIEW COMPARISON:  07/31/2019.  CT 07/23/2019. FINDINGS: Mediastinum hilar structures normal. Persistent atelectasis and infiltrate right lung base. Persistent cavitary changes right lung base. Persistent right-sided pleural effusion. Similar findings noted on prior exam. Symmetric single nodular opacities noted over the chest bilaterally consistent nipple shadows. Stable cardiomegaly. No acute bony abnormality. IMPRESSION: Persistent right base atelectasis and infiltrate. Persistent cavitary changes right lung base. Persistent right-sided pleural effusion. No significant interim change from prior exam. Electronically Signed   By: [place]   On: 08/03/2019 11:22    EKG: Independently reviewed.   Assessment/Plan Principal Problem:   Acute respiratory failure with hypoxia (HCC) Active Problems:   Protein-calorie malnutrition, severe   Adenocarcinoma of right  lung, stage 3 (HCC)   Febrile neutropenia (HCC)   Persistent atrial fibrillation (HCC)   History of pulmonary embolism    Acute respiratory failure with hypoxia Patient presenting from oncology clinic with progressive shortness of breath, weakness, fatigue and fevers at home.  Currently on a chemotherapy.  Rapid Covid-19 test negative.  Not on home oxygen.  Differential diagnosis includes pneumonia with right lung opacity, worsening pleural effusion, symptomatic anemia vs Covid-19 viral pneumonia. --Admit to inpatient, telemetry --Transfuse 2 unit PRBCs as below --Empiric antibiotics with vancomycin/cefepime --Continue supplemental oxygen, titrate for SPO2 greater than 92% --Supportive care --Continue airborne/contact isolation until Covid-19 PCR returns back negative  Symptomatic anemia. Pancytopenia Etiology likely secondary to active chemotherapy.  WBC count 0.4, hemoglobin 7.7, platelet 21. --No signs of active bleeding --Transfuse 2 units PRBC as above --Repeat  hemoglobin following transfusion --Supportive care, supplemental oxygen  Neutropenic fever Patient reports fevers at home.  Given his severe neutropenia with active chemotherapy initiated empiric antibiotics.  Chest x-ray with concern for right base infiltrate. --Urinalysis/urine culture: Pending --Blood cultures x2: Pending --Continue to trend lactic acid --Continue antibiotics with vancomycin/cefepime, pharmacy consulted for dosing/monitoring  Concern for esophageal candidiasis Patient reports painful swallowing. --Fluconazole --Magic mouthwash  Stage IV adenocarcinoma right lung Follows with medical oncology, Dr. [name].  Actively on chemotherapy. --Continue outpatient follow-up  Permanent atrial fibrillation --Continue metoprolol tartrate 25 mg p.o. twice daily --Xarelto 20 mg p.o. daily  Hx pulmonary embolism --Continue Xarelto as above   DVT prophylaxis: Xarelto Code Status: Full code Family Communication: Discussed with patient extensively at bedside Disposition Plan: Anticipate discharge home when medically ready Consults called: Medical oncology, Dr. [name] tagged to epic chart Admission status: Inpatient   Severity of Illness: The appropriate patient status for this patient is INPATIENT. Inpatient status is judged to be reasonable and necessary in order to provide the required intensity of service to ensure the patient's safety. The patient's presenting symptoms, physical exam findings, and initial radiographic and laboratory data in the context of their chronic comorbidities is felt to place them at high risk for further clinical deterioration. Furthermore, it is not anticipated that the patient will be medically stable for discharge from the hospital within 2 midnights of admission. The following factors support the patient status of inpatient.   " The patient's presenting symptoms include shortness of breath, weakness, fatigue " The worrisome physical exam  findings include conjunctival pallor, hypoxia " The initial radiographic and laboratory data are worrisome because of pancytopenia, elevated lactic acid " The chronic co-morbidities include permanent atrial fibrillation, stage IV non-small cell lung cancer on active chemotherapy, history of PE.   * I certify that at the point of admission it is my clinical judgment that the patient will require inpatient hospital care spanning beyond 2 midnights from the point of admission due to high intensity of service, high risk for further deterioration and high frequency of surveillance required.*    Tyler [name] J [place] DO [facility] Available via Epic secure chat 7am-7pm After these hours, please refer to coverage provider listed on [email or web address] 08/03/2019, 2:27 PM

## 2019-08-03 NOTE — ED Triage Notes (Signed)
Patient brought over by cancer center. Per patient, he has lung cancer, was scheduled to have transfusion today but became [facility] last night. Cancer ctr staff brought patient to ED.

## 2019-08-03 NOTE — Progress Notes (Signed)
[facility] Progress Note   Tyler [name] [id number] 12-15-1960 59 y.o.  Tyler [name] is managed by Dr. Fanny Bien. Mohamed  Actively treated with chemotherapy/immunotherapy/hormonal therapy: yes  Current therapy: palliative systemic chemotherapy with carboplatin for an AUC of 5, Alimta 500 mg/m and Keytruda 200 mg IV every 3 weeks  Next scheduled appointment with provider: 08/14/2019  Assessment: Plan:    Adenocarcinoma of right lung, stage 4 (HCC)  Diarrhea, unspecified type  Weakness   Metastatic adenocarcinoma of the right lung: Patient continues to be managed by Dr. Julien Nordmann and is status post cycle 4 of palliative systemic chemotherapy with carboplatin for an AUC of 5, Alimta 500 mg/m and Keytruda 200 mg IV every 3 weeks.  The idea of hospice had been discussed with the patient with the patient declining hospice at this time.  He is scheduled to see Dr. Julien Nordmann in follow-up on 08/14/2019.  Diarrhea and generalized weakness in the setting of recently noted hypokalemia: The patient was transported to the emergency room for evaluation and management.  Please see After Visit Summary for patient specific instructions.  Future Appointments  Date Time Provider [place]  08/07/2019 11:15 AM [facility] 1 [facility] None  08/14/2019  8:30 AM [facility] LAB 6 [facility] None  08/14/2019  9:00 AM Heilingoetter, Cassandra L, PA-C [facility] None  08/14/2019 10:00 AM [facility] INFUSION [facility] None  08/14/2019 11:15 AM Jennet Maduro, RD [facility] None  08/21/2019 11:45 AM [facility] LAB 3 [facility] None  08/28/2019 11:30 AM [facility] LAB 3 [facility] None  09/04/2019  9:45 AM [facility] LAB 3 [facility] None  09/04/2019 10:15 AM [name], MD [facility] None  09/04/2019 11:00 AM [facility] INFUSION [facility] None    No orders of the defined types were placed in this encounter.      Subjective:   Patient ID:  Tyler [name]  is a 59 y.o. (DOB 25-Mar-1961) male.  Chief Complaint: No chief complaint on file.   HPI Tyler [name]  Is a 59 y.o. male with a diagnosis of a metastatic adenocarcinoma of the right lung.Marland Kitchen He continues to be managed by Dr. Julien Nordmann and is status post cycle 4 of palliative systemic chemotherapy with carboplatin for an AUC of 5, Alimta 500 mg/m and Keytruda 200 mg IV every 3 weeks.  He was most recently brought to the emergency room on 07/31/2019 with mouth sores, anorexia, and poor nutritional intake.  He was having fatigue, shortness of breath and dyspnea on exertion and generalized weakness.  His most recent chemotherapy had been administered on 07/24/2019.  He had been noted to have hypokalemia and had a prescription for oral potassium along with other medications sent to his pharmacy.  He reported financial and transportation constraints and had not gotten his medications.  He also reported that he had stopped Xarelto for the month of December due to expense of the medication.  He had been given Xarelto for pulmonary emboli.  He restarted his Xarelto approximately 1 week prior to his ER visit of 07/31/2019.  He had not taken potassium and was found to have a potassium of 2.7 at the time of his ER visit.  He was given IV potassium.  He was seen today in the waiting room bathroom when he was having a bowel movement and could not get up off of the toilet due to his weakness.  He reports having ongoing diarrhea.  Because of his generalized weakness, history of hypokalemia and recurrent diarrhea he was transported  to the emergency room for evaluation and management.  Medications: I have reviewed the patient's current medications.  Allergies: No Known Allergies  Past Medical History:  Diagnosis Date  . Alcohol abuse   . Chronic back pain   . COPD (chronic obstructive pulmonary disease) ([place])   . Dyspnea   . Lung mass    mediastinal adenopathy  . Lung nodule   . Multiple pulmonary nodules 04/02/2018   . Pneumonia   . Right lower lobe pulmonary nodule 04/02/2018   12/01/2017: PET SUV 2.1  . Tobacco abuse     Past Surgical History:  Procedure Laterality Date  . APPENDECTOMY    . VIDEO BRONCHOSCOPY WITH ENDOBRONCHIAL NAVIGATION N/A 04/05/2018   Procedure: VIDEO BRONCHOSCOPY WITH ENDOBRONCHIAL NAVIGATION;  Surgeon: Garner Nash, DO;  Location: [place];  Service: Thoracic;  Laterality: N/A;  . VIDEO BRONCHOSCOPY WITH ENDOBRONCHIAL NAVIGATION N/A 01/11/2019   Procedure: VIDEO BRONCHOSCOPY WITH ENDOBRONCHIAL NAVIGATION;  Surgeon: Garner Nash, DO;  Location: [place];  Service: Thoracic;  Laterality: N/A;  . VIDEO BRONCHOSCOPY WITH ENDOBRONCHIAL ULTRASOUND N/A 01/11/2019   Procedure: VIDEO BRONCHOSCOPY WITH ENDOBRONCHIAL ULTRASOUND;  Surgeon: Garner Nash, DO;  Location: [place];  Service: Thoracic;  Laterality: N/A;  . VIDEO BRONCHOSCOPY WITH RADIAL ENDOBRONCHIAL ULTRASOUND N/A 01/11/2019   Procedure: VIDEO BRONCHOSCOPY WITH RADIAL ENDOBRONCHIAL ULTRASOUND;  Surgeon: Garner Nash, DO;  Location: [facility];  Service: Thoracic;  Laterality: N/A;  . WISDOM TOOTH EXTRACTION      Family History  Problem Relation Age of Onset  . Heart attack Maternal Grandfather     Social History   Socioeconomic History  . Marital status: Married    Spouse name: Not on file  . Number of children: Not on file  . Years of education: Not on file  . Highest education level: Not on file  Occupational History  . Not on file  Tobacco Use  . Smoking status: Current Every Day Smoker    Packs/day: 0.25    Years: 40.00    Pack years: 10.00    Types: Cigarettes  . Smokeless tobacco: Never Used  Substance and Sexual Activity  . Alcohol use: Not Currently  . Drug use: Not Currently    Types: Marijuana  . Sexual activity: Not on file  Other Topics Concern  . Not on file  Social History Narrative  . Not on file   Social Determinants of Health   Financial Resource Strain:   . Difficulty of Paying Living  Expenses: Not on file  Food Insecurity:   . Worried About Charity fundraiser in the Last Year: Not on file  . Ran Out of Food in the Last Year: Not on file  Transportation Needs: Unmet Transportation Needs  . Lack of Transportation (Medical): Yes  . Lack of Transportation (Non-Medical): Yes  Physical Activity:   . Days of Exercise per Week: Not on file  . Minutes of Exercise per Session: Not on file  Stress:   . Feeling of Stress : Not on file  Social Connections:   . Frequency of Communication with Friends and Family: Not on file  . Frequency of Social Gatherings with Friends and Family: Not on file  . Attends Religious Services: Not on file  . Active Member of Clubs or Organizations: Not on file  . Attends Archivist Meetings: Not on file  . Marital Status: Not on file  Intimate Partner Violence:   . Fear of Current or Ex-Partner: Not on  file  . Emotionally Abused: Not on file  . Physically Abused: Not on file  . Sexually Abused: Not on file    Past Medical History, Surgical history, Social history, and Family history were reviewed and updated as appropriate.   Please see review of systems for further details on the patient's review from today.   Review of Systems:  Review of Systems  Constitutional: Negative for appetite change, chills, diaphoresis and fever.  HENT: Negative for trouble swallowing.   Respiratory: Negative for cough, chest tightness and shortness of breath.   Cardiovascular: Negative for chest pain, palpitations and leg swelling.  Gastrointestinal: Positive for diarrhea. Negative for abdominal distention, abdominal pain, blood in stool, constipation, nausea and vomiting.  Genitourinary: Negative for decreased urine volume and difficulty urinating.  Neurological: Positive for weakness.    Objective:   Physical Exam:  There were no vitals taken for this visit. ECOG: 1  Physical Exam Constitutional:      General: He is not in acute  distress.    Appearance: He is not diaphoretic.  HENT:     Head: Normocephalic and atraumatic.  Cardiovascular:     Rate and Rhythm: Normal rate and regular rhythm.     Heart sounds: Normal heart sounds. No murmur. No friction rub. No gallop.   Pulmonary:     Effort: Pulmonary effort is normal. No respiratory distress.     Breath sounds: Normal breath sounds. No wheezing or rales.  Musculoskeletal:     Right lower leg: No edema.     Left lower leg: No edema.  Skin:    General: Skin is warm and dry.     Findings: No erythema or rash.  Neurological:     Mental Status: He is alert.     Gait: Gait abnormal (The patient was ambulating in a wheelchair. ).     Lab Review:     Component Value Date/Time   NA 134 (L) 08/03/2019 1204   K 3.5 08/03/2019 1204   CL 88 (L) 08/03/2019 1204   CO2 31 08/03/2019 1204   GLUCOSE 139 (H) 08/03/2019 1204   BUN 17 08/03/2019 1204   CREATININE 1.29 (H) 08/03/2019 1204   CREATININE 1.01 07/31/2019 1125   CALCIUM 7.8 (L) 08/03/2019 1204   PROT 7.7 08/03/2019 1204   ALBUMIN 2.7 (L) 08/03/2019 1204   AST 31 08/03/2019 1204   AST 26 07/31/2019 1125   ALT 12 08/03/2019 1204   ALT 11 07/31/2019 1125   ALKPHOS 78 08/03/2019 1204   BILITOT 1.2 08/03/2019 1204   BILITOT 0.6 07/31/2019 1125   GFRNONAA 60 (L) 08/03/2019 1204   GFRNONAA >60 07/31/2019 1125   GFRAA >60 08/03/2019 1204   GFRAA >60 07/31/2019 1125       Component Value Date/Time   WBC 0.4 (LL) 08/03/2019 1204   RBC 2.90 (L) 08/03/2019 1204   HGB 7.7 (L) 08/03/2019 1204   HGB 8.2 (L) 07/31/2019 1125   HCT 24.4 (L) 08/03/2019 1204   HCT 26.0 (L) 04/10/2019 0450   PLT 21 (LL) 08/03/2019 1204   PLT 88 (L) 07/31/2019 1125   MCV 84.1 08/03/2019 1204   MCH 26.6 08/03/2019 1204   MCHC 31.6 08/03/2019 1204   RDW 18.6 (H) 08/03/2019 1204   LYMPHSABS 0.4 (L) 07/31/2019 1433   MONOABS 0.1 07/31/2019 1433   EOSABS 0.0 07/31/2019 1433   BASOSABS 0.0 07/31/2019 1433    -------------------------------  Imaging from last 24 hours (if applicable):  Radiology interpretation: CT Chest  W Contrast  Result Date: 07/23/2019 CLINICAL DATA:  Stage IV right lower lobe lung adenocarcinoma diagnosed July 2020 status post concurrent chemoradiation therapy with ongoing chemotherapy and immunotherapy. Restaging. EXAM: CT CHEST, ABDOMEN, AND PELVIS WITH CONTRAST TECHNIQUE: Multidetector CT imaging of the chest, abdomen and pelvis was performed following the standard protocol during bolus administration of intravenous contrast. CONTRAST:  59mL OMNIPAQUE IOHEXOL 300 MG/ML  SOLN COMPARISON:  04/29/2019 chest CT.  12/27/2018 PET-CT. FINDINGS: CT CHEST FINDINGS Cardiovascular: Normal heart size. No significant pericardial effusion/thickening. Left circumflex and right coronary atherosclerosis. Mildly atherosclerotic nonaneurysmal thoracic aorta. Normal caliber pulmonary arteries. No central pulmonary emboli. Mediastinum/Nodes: No discrete thyroid nodules. Unremarkable esophagus. No pathologically enlarged axillary, mediastinal or hilar lymph nodes. Lungs/Pleura: No pneumothorax. Small loculated basilar right pleural effusion, slightly increased from 04/29/2019. Interval removal right pleural chest tube. No left pleural effusion. Mild centrilobular and paraseptal emphysema. The previously visualized extensive consolidation and patchy ground-glass opacity throughout the left lung on 04/29/2019 chest CT has essentially resolved. Extensive irregular masslike consolidation throughout the right lower lobe measures up to 9.8 x 7.4 cm in maximum axial dimensions and is somewhat sharply marginated (series 4/image 101), increased from 8.0 x 5.8 cm on 04/29/2019. Evolving sharply marginated mild right perihilar radiation fibrosis. New cavitary 2.9 x 2.4 cm focus in the medial right lower lobe (series 4/image 106) with air-fluid level. Dominant 0.8 cm solid apical right upper lobe nodule (series 4/image  27), stable. Additional scattered subcentimeter indistinct sub solid nodules in both lungs are not appreciably changed. No definite new pulmonary nodules. Musculoskeletal: No aggressive appearing focal osseous lesions. Stable small bone island in T3 vertebral body. CT ABDOMEN PELVIS FINDINGS Hepatobiliary: Normal liver with no liver mass. Cholelithiasis. No biliary ductal dilatation. Pancreas: Normal, with no mass or duct dilation. Spleen: Normal size. No mass. Adrenals/Urinary Tract: Normal adrenals. No hydronephrosis. Heterogeneous enhancement of the renal parenchyma in both kidneys without discrete renal masses. Subcentimeter hypodense lower left renal cortical lesion is too small to characterize and unchanged since 12/27/2018 PET-CT, considered benign. Normal bladder. Stomach/Bowel: Normal non-distended stomach. Normal caliber small bowel with no small bowel wall thickening. Appendix not discretely visualized. Moderate sigmoid diverticulosis, with no large bowel wall thickening or significant pericolonic fat stranding. Oral contrast transits to the rectum. Vascular/Lymphatic: Atherosclerotic nonaneurysmal abdominal aorta. Patent portal, splenic, hepatic and renal veins. No pathologically enlarged lymph nodes in the abdomen or pelvis. Reproductive: Normal size prostate. Other: No pneumoperitoneum. No focal fluid collection. Small volume pelvic ascites. Musculoskeletal: No aggressive appearing focal osseous lesions. Mild lumbar spondylosis. IMPRESSION: 1. Extensive irregular masslike consolidation replacing much of the right lower lobe, somewhat sharply marginated, mildly increased since 04/29/2019 chest CT, indeterminate for evolving radiation fibrosis versus viable tumor. New cavitary focus within the medial right lower lobe with air-fluid level. PET-CT could be considered for further evaluation versus close chest CT follow-up in 3 months. 2. Scattered ill-defined subcentimeter pulmonary nodules are not  definitely changed. Previously visualized left lung opacities have largely resolved. 3. No pathologically enlarged thoracic nodes. No evidence of metastatic disease in the abdomen or pelvis. 4. Small loculated right pleural effusion. Interval removal of right chest tube. 5. Small volume pelvic ascites. 6. Nonspecific heterogeneous enhancement of the kidneys bilaterally. No hydronephrosis. Findings could represent medical renal disease or pyelonephritis. No evidence of renal abscess. 7. Aortic Atherosclerosis (ICD10-I70.0) and Emphysema (ICD10-J43.9). These results will be called to the ordering clinician or representative by the Radiologist Assistant, and communication documented in the PACS or zVision Dashboard.  Electronically Signed   By: Ilona Sorrel M.D.   On: 07/23/2019 11:09   CT Abdomen Pelvis W Contrast  Result Date: 07/23/2019 CLINICAL DATA:  Stage IV right lower lobe lung adenocarcinoma diagnosed July 2020 status post concurrent chemoradiation therapy with ongoing chemotherapy and immunotherapy. Restaging. EXAM: CT CHEST, ABDOMEN, AND PELVIS WITH CONTRAST TECHNIQUE: Multidetector CT imaging of the chest, abdomen and pelvis was performed following the standard protocol during bolus administration of intravenous contrast. CONTRAST:  12mL OMNIPAQUE IOHEXOL 300 MG/ML  SOLN COMPARISON:  04/29/2019 chest CT.  12/27/2018 PET-CT. FINDINGS: CT CHEST FINDINGS Cardiovascular: Normal heart size. No significant pericardial effusion/thickening. Left circumflex and right coronary atherosclerosis. Mildly atherosclerotic nonaneurysmal thoracic aorta. Normal caliber pulmonary arteries. No central pulmonary emboli. Mediastinum/Nodes: No discrete thyroid nodules. Unremarkable esophagus. No pathologically enlarged axillary, mediastinal or hilar lymph nodes. Lungs/Pleura: No pneumothorax. Small loculated basilar right pleural effusion, slightly increased from 04/29/2019. Interval removal right pleural chest tube. No left  pleural effusion. Mild centrilobular and paraseptal emphysema. The previously visualized extensive consolidation and patchy ground-glass opacity throughout the left lung on 04/29/2019 chest CT has essentially resolved. Extensive irregular masslike consolidation throughout the right lower lobe measures up to 9.8 x 7.4 cm in maximum axial dimensions and is somewhat sharply marginated (series 4/image 101), increased from 8.0 x 5.8 cm on 04/29/2019. Evolving sharply marginated mild right perihilar radiation fibrosis. New cavitary 2.9 x 2.4 cm focus in the medial right lower lobe (series 4/image 106) with air-fluid level. Dominant 0.8 cm solid apical right upper lobe nodule (series 4/image 27), stable. Additional scattered subcentimeter indistinct sub solid nodules in both lungs are not appreciably changed. No definite new pulmonary nodules. Musculoskeletal: No aggressive appearing focal osseous lesions. Stable small bone island in T3 vertebral body. CT ABDOMEN PELVIS FINDINGS Hepatobiliary: Normal liver with no liver mass. Cholelithiasis. No biliary ductal dilatation. Pancreas: Normal, with no mass or duct dilation. Spleen: Normal size. No mass. Adrenals/Urinary Tract: Normal adrenals. No hydronephrosis. Heterogeneous enhancement of the renal parenchyma in both kidneys without discrete renal masses. Subcentimeter hypodense lower left renal cortical lesion is too small to characterize and unchanged since 12/27/2018 PET-CT, considered benign. Normal bladder. Stomach/Bowel: Normal non-distended stomach. Normal caliber small bowel with no small bowel wall thickening. Appendix not discretely visualized. Moderate sigmoid diverticulosis, with no large bowel wall thickening or significant pericolonic fat stranding. Oral contrast transits to the rectum. Vascular/Lymphatic: Atherosclerotic nonaneurysmal abdominal aorta. Patent portal, splenic, hepatic and renal veins. No pathologically enlarged lymph nodes in the abdomen or  pelvis. Reproductive: Normal size prostate. Other: No pneumoperitoneum. No focal fluid collection. Small volume pelvic ascites. Musculoskeletal: No aggressive appearing focal osseous lesions. Mild lumbar spondylosis. IMPRESSION: 1. Extensive irregular masslike consolidation replacing much of the right lower lobe, somewhat sharply marginated, mildly increased since 04/29/2019 chest CT, indeterminate for evolving radiation fibrosis versus viable tumor. New cavitary focus within the medial right lower lobe with air-fluid level. PET-CT could be considered for further evaluation versus close chest CT follow-up in 3 months. 2. Scattered ill-defined subcentimeter pulmonary nodules are not definitely changed. Previously visualized left lung opacities have largely resolved. 3. No pathologically enlarged thoracic nodes. No evidence of metastatic disease in the abdomen or pelvis. 4. Small loculated right pleural effusion. Interval removal of right chest tube. 5. Small volume pelvic ascites. 6. Nonspecific heterogeneous enhancement of the kidneys bilaterally. No hydronephrosis. Findings could represent medical renal disease or pyelonephritis. No evidence of renal abscess. 7. Aortic Atherosclerosis (ICD10-I70.0) and Emphysema (ICD10-J43.9). These results will be called  to the ordering clinician or representative by the Radiologist Assistant, and communication documented in the PACS or zVision Dashboard. Electronically Signed   By: Ilona Sorrel M.D.   On: 07/23/2019 11:09   DG Chest Port 1 View  Result Date: 08/03/2019 CLINICAL DATA:  Shortness of breath.  Hypoxia. EXAM: PORTABLE CHEST 1 VIEW COMPARISON:  07/31/2019.  CT 07/23/2019. FINDINGS: Mediastinum hilar structures normal. Persistent atelectasis and infiltrate right lung base. Persistent cavitary changes right lung base. Persistent right-sided pleural effusion. Similar findings noted on prior exam. Symmetric single nodular opacities noted over the chest bilaterally  consistent nipple shadows. Stable cardiomegaly. No acute bony abnormality. IMPRESSION: Persistent right base atelectasis and infiltrate. Persistent cavitary changes right lung base. Persistent right-sided pleural effusion. No significant interim change from prior exam. Electronically Signed   By: [place]   On: 08/03/2019 11:22   DG Chest Portable 1 View  Result Date: 07/31/2019 CLINICAL DATA:  Shortness of breath EXAM: PORTABLE CHEST 1 VIEW COMPARISON:  April 28, 2019 FINDINGS: There is loculated effusion at the right base with atelectatic change in the right base. There is consolidation in the medial right base. Lungs elsewhere clear. Heart is upper normal in size with pulmonary vascularity normal. No adenopathy. No bone lesions. IMPRESSION: Loculated pleural effusion right base with consolidation medial right base and atelectatic change elsewhere in the right base. Left lung clear. Heart upper normal in size. Electronically Signed   By: Lowella Grip III M.D.   On: 07/31/2019 18:23

## 2019-08-03 NOTE — Telephone Encounter (Signed)
Called report to [place], RN charge -pt with weakness, syncope, chills ,on oxygen 3 liters ( new ) . Per Jinny Blossom bring pt to EMS doc in lobby. Seth Bake notified.

## 2019-08-04 LAB — BPAM RBC
Blood Product Expiration Date: 202103012359
Blood Product Expiration Date: 202103022359
ISSUE DATE / TIME: 202101221855
ISSUE DATE / TIME: 202101222241
Unit Type and Rh: 7300
Unit Type and Rh: 7300

## 2019-08-04 LAB — TYPE AND SCREEN
ABO/RH(D): B POS
Antibody Screen: NEGATIVE
Unit division: 0
Unit division: 0

## 2019-08-04 LAB — CBC WITH DIFFERENTIAL/PLATELET
Abs Immature Granulocytes: 0.01 10*3/uL (ref 0.00–0.07)
Basophils Absolute: 0 10*3/uL (ref 0.0–0.1)
Basophils Relative: 0 %
Eosinophils Absolute: 0 10*3/uL (ref 0.0–0.5)
Eosinophils Relative: 4 %
HCT: 28.8 % — ABNORMAL LOW (ref 39.0–52.0)
Hemoglobin: 9.7 g/dL — ABNORMAL LOW (ref 13.0–17.0)
Immature Granulocytes: 4 %
Lymphocytes Relative: 56 %
Lymphs Abs: 0.1 10*3/uL — ABNORMAL LOW (ref 0.7–4.0)
MCH: 27 pg (ref 26.0–34.0)
MCHC: 33.7 g/dL (ref 30.0–36.0)
MCV: 80.2 fL (ref 80.0–100.0)
Monocytes Absolute: 0.1 10*3/uL (ref 0.1–1.0)
Monocytes Relative: 20 %
Neutro Abs: 0 10*3/uL — ABNORMAL LOW (ref 1.7–7.7)
Neutrophils Relative %: 16 %
Platelets: 8 10*3/uL — CL (ref 150–400)
RBC: 3.59 MIL/uL — ABNORMAL LOW (ref 4.22–5.81)
RDW: 16.4 % — ABNORMAL HIGH (ref 11.5–15.5)
WBC: 0.3 10*3/uL — CL (ref 4.0–10.5)
nRBC: 0 % (ref 0.0–0.2)

## 2019-08-04 LAB — BASIC METABOLIC PANEL
Anion gap: 12 (ref 5–15)
BUN: 18 mg/dL (ref 6–20)
CO2: 27 mmol/L (ref 22–32)
Calcium: 7 mg/dL — ABNORMAL LOW (ref 8.9–10.3)
Chloride: 92 mmol/L — ABNORMAL LOW (ref 98–111)
Creatinine, Ser: 1.17 mg/dL (ref 0.61–1.24)
GFR calc Af Amer: >60 mL/min (ref >60)
GFR calc non Af Amer: >60 mL/min (ref >60)
Glucose, Bld: 82 mg/dL (ref 70–99)
Potassium: 3 mmol/L — ABNORMAL LOW (ref 3.5–5.1)
Sodium: 131 mmol/L — ABNORMAL LOW (ref 135–145)

## 2019-08-04 LAB — SARS CORONAVIRUS 2 (TAT 6-24 HRS): SARS Coronavirus 2: NEGATIVE

## 2019-08-04 LAB — MAGNESIUM: Magnesium: 0.9 mg/dL — CL (ref 1.7–2.4)

## 2019-08-04 LAB — MRSA PCR SCREENING: MRSA by PCR: NEGATIVE

## 2019-08-04 MED ORDER — LIP MEDEX EX OINT
1.0000 "application " | TOPICAL_OINTMENT | CUTANEOUS | Status: DC | PRN
  Filled 2019-08-04: qty 7

## 2019-08-04 MED ORDER — MAGNESIUM SULFATE 2 GM/50ML IV SOLN
2.0000 g | Freq: Once | INTRAVENOUS | Status: AC
  Administered 2019-08-04: 2 g via INTRAVENOUS
  Filled 2019-08-04: qty 50

## 2019-08-04 MED ORDER — MORPHINE SULFATE (PF) 2 MG/ML IV SOLN
2.0000 mg | INTRAVENOUS | Status: AC | PRN
  Administered 2019-08-05 (×4): 2 mg via INTRAVENOUS
  Filled 2019-08-04 (×5): qty 1

## 2019-08-04 MED ORDER — PHENOL 1.4 % MT LIQD
1.0000 | OROMUCOSAL | Status: DC | PRN
  Filled 2019-08-04: qty 177

## 2019-08-04 MED ORDER — GUAIFENESIN-DM 100-10 MG/5ML PO SYRP
5.0000 mL | ORAL_SOLUTION | ORAL | Status: DC | PRN
  Administered 2019-08-09: 15:00:00 5 mL via ORAL
  Filled 2019-08-04: qty 10

## 2019-08-04 MED ORDER — OXYCODONE-ACETAMINOPHEN 5-325 MG PO TABS
1.0000 | ORAL_TABLET | Freq: Four times a day (QID) | ORAL | Status: DC | PRN
  Administered 2019-08-04 – 2019-08-07 (×9): 1 via ORAL
  Filled 2019-08-04 (×9): qty 1

## 2019-08-04 MED ORDER — POTASSIUM CHLORIDE 10 MEQ/100ML IV SOLN
10.0000 meq | INTRAVENOUS | Status: AC
  Administered 2019-08-04 (×2): 10 meq via INTRAVENOUS
  Filled 2019-08-04 (×2): qty 100

## 2019-08-04 MED ORDER — TBO-FILGRASTIM 300 MCG/0.5ML ~~LOC~~ SOSY
300.0000 ug | PREFILLED_SYRINGE | Freq: Once | SUBCUTANEOUS | Status: AC
  Administered 2019-08-04: 300 ug via SUBCUTANEOUS
  Filled 2019-08-04: qty 0.5

## 2019-08-04 NOTE — Progress Notes (Signed)
Pt. has WBC 0.3 and PLTS 8. Neutropenic precaution sign on door.

## 2019-08-04 NOTE — Progress Notes (Addendum)
PROGRESS NOTE  BEAR OSTEN SWN:[id number] DOB: 1961-01-15 DOA: 08/03/2019 PCP: Brand Males, MD  HPI/Recap of past 24 hours: Per HPI TAI SKELLY is a 59 y.o. male with medical history significant of permanent atrial fibrillation, stage IV non-small cell lung cancer/adenocarcinoma on current chemotherapy, history of pulmonary embolism, EtOH abuse, tobacco abuse who presents from oncology clinic with cough, fever, progressive shortness of breath.  Patient reports onset roughly 2-3 days ago with associated dizziness, fatigue, weakness, especially when standing.  No other specific complaints at this time.  Denies headache, no chest pain, no palpitations, no abdominal pain.   Subjective: Patient seen and examined at bedside.  Nurse stated patient is complaining of pain when he eats despite Magic mouthwash also he is on morphine suspension for pain.   Assessment/Plan: Principal Problem:   Acute respiratory failure with hypoxia (HCC) Active Problems:   Protein-calorie malnutrition, severe   Adenocarcinoma of right lung, stage 3 (HCC)   Febrile neutropenia (HCC)   Persistent atrial fibrillation (HCC)   History of pulmonary embolism  Acute respiratory failure with hypoxia   Patient was admitted from oncology clinic with progressive shortness of breath and weakness and fatigue and fever at home is currently on chemotherapy. He was started on supplemental oxygen His COVID-19 PCR was negative Continue supportive care   Symptomatic anemia. Pancytopenia This is likely from active chemotherapy there is however no sign of bleeding. He received 2 unit of packed RBC. We will continue supportive care with oxygen Monitor and transfuse as needed  Neutropenic fever Patient has neutropenic fever due to chemotherapy he is started on antibiotic empirically with vancomycin and cefepime. Pharmacy is titrating and monitoring. His chest x-ray was concerning for right basilar infiltrate We  will continue to follow blood cultures and urine cultures  Concern for esophageal candidiasis Patient has been have a lot of pain and difficulty swallowing doing due to this he thinks that the Magic mouthwash is making it worse. We will continue fluconazole and Magic mouthwash also will add viscous lidocaine and pain management if needed  Stage IV adenocarcinoma right lung Patient is followed up by medical oncology Dr. [name]. He is still on chemotherapy. He will continue chemotherapy outpatient once discharged.  Permanent atrial fibrillation -Continue metoprolol 25 mg twice daily Continue to hold Xarelto 20 mg daily due to neutropenia   Hx pulmonary embolism Continue to hold Xarelto 20 mg daily due to neutropenia  Neutropenia.  Patient was started on Granix.  We will continue daily Granix and monitoring of CBC until unacceptable level is reached.  Pharmacy is also monitoring  Code Status: Full  Severity of Illness: The appropriate patient status for this patient is INPATIENT. Inpatient status is judged to be reasonable and necessary in order to provide the required intensity of service to ensure the patient's safety. The patient's presenting symptoms, physical exam findings, and initial radiographic and laboratory data in the context of their chronic comorbidities is felt to place them at high risk for further clinical deterioration. Furthermore, it is not anticipated that the patient will be medically stable for discharge from the hospital within 2 midnights of admission. The following factors support the patient status of inpatient.  Adenocarcinoma requiring IV antibiotics  * I certify that at the point of admission it is my clinical judgment that the patient will require inpatient hospital care spanning beyond 2 midnights from the point of admission due to high intensity of service, high risk for further deterioration and high frequency of  surveillance required.*    Family  Communication: None at bedside  Disposition Plan: Home   Consultants:  Oncology  Procedures:  None  Antimicrobials:  Cefepime  Vancomycin  DVT prophylaxis: Xarelto   Objective: Vitals:   08/03/19 2304 08/04/19 0122 08/04/19 0527 08/04/19 0545  BP: (!) 122/92 124/87 (!) 118/92   Pulse: (!) 101 93 (!) 121 (!) 102  Resp: 20 (!) 21 18   Temp: 98.4 F (36.9 C) 98.8 F (37.1 C) 98.4 F (36.9 C)   TempSrc: Oral Oral Oral   SpO2: 100% 100% 100%   Weight:      Height:        Intake/Output Summary (Last 24 hours) at 08/04/2019 0918 Last data filed at 08/04/2019 0914 Gross per 24 hour  Intake 3326.56 ml  Output 750 ml  Net 2576.56 ml   Filed Weights   08/03/19 1135  Weight: 51.3 kg   Body mass index is 14.51 kg/m.  Exam:  . General: 59 y.o. year-old male well developed well nourished in no acute distress.  Alert and oriented x3.  Chronically ill looking . HEENT.  Swollen lip ulcers in the oral cavity . Cardiovascular: Regular rate and rhythm with no rubs or gallops.  No thyromegaly or JVD noted.   Marland Kitchen Respiratory: Clear to auscultation with no wheezes or rales. Good inspiratory effort. . Abdomen: Soft nontender nondistended with normal bowel sounds x4 quadrants. . Musculoskeletal: No lower extremity edema. 2/4 pulses in all 4 extremities. . Skin: No ulcerative lesions noted or rashes, . Psychiatry: Mood is appropriate for condition and setting    Data Reviewed: CBC: Recent Labs  Lab 07/31/19 1125 07/31/19 1433 08/03/19 1204 08/04/19 0247  WBC 1.8* 1.8* 0.4* 0.3*  NEUTROABS 1.2* 1.3*  --  0.0*  HGB 8.2* 9.3* 7.7* 9.7*  HCT 25.2* 29.1* 24.4* 28.8*  MCV 80.8 82.7 84.1 80.2  PLT 88* 95* 21* 8*   Basic Metabolic Panel: Recent Labs  Lab 07/31/19 1125 07/31/19 1433 08/03/19 1204 08/04/19 0247  NA 136 137 134* 131*  K 2.7* 2.7* 3.5 3.0*  CL 89* 88* 88* 92*  CO2 34* 36* 31 27  GLUCOSE 129* 88 139* 82  BUN 14 16 17 18   CREATININE 1.01 1.13 1.29*  1.17  CALCIUM 7.6* 8.3* 7.8* 7.0*  MG  --   --   --  0.9*   GFR: Estimated Creatinine Clearance: 48.7 mL/min (by C-G formula based on SCr of 1.17 mg/dL). Liver Function Tests: Recent Labs  Lab 07/31/19 1125 08/03/19 1204  AST 26 31  ALT 11 12  ALKPHOS 99 78  BILITOT 0.6 1.2  PROT 7.2 7.7  ALBUMIN 2.4* 2.7*   No results for input(s): LIPASE, AMYLASE in the last 168 hours. No results for input(s): AMMONIA in the last 168 hours. Coagulation Profile: No results for input(s): INR, PROTIME in the last 168 hours. Cardiac Enzymes: No results for input(s): CKTOTAL, CKMB, CKMBINDEX, TROPONINI in the last 168 hours. BNP (last 3 results) No results for input(s): PROBNP in the last 8760 hours. HbA1C: No results for input(s): HGBA1C in the last 72 hours. CBG: No results for input(s): GLUCAP in the last 168 hours. Lipid Profile: No results for input(s): CHOL, HDL, LDLCALC, TRIG, CHOLHDL, LDLDIRECT in the last 72 hours. Thyroid Function Tests: No results for input(s): TSH, T4TOTAL, FREET4, T3FREE, THYROIDAB in the last 72 hours. Anemia Panel: No results for input(s): VITAMINB12, FOLATE, FERRITIN, TIBC, IRON, RETICCTPCT in the last 72 hours. Urine analysis:  Component Value Date/Time   COLORURINE YELLOW 08/03/2019 1112   APPEARANCEUR HAZY (A) 08/03/2019 1112   LABSPEC 1.014 08/03/2019 1112   PHURINE 5.0 08/03/2019 1112   GLUCOSEU NEGATIVE 08/03/2019 1112   HGBUR NEGATIVE 08/03/2019 1112   BILIRUBINUR NEGATIVE 08/03/2019 1112   KETONESUR NEGATIVE 08/03/2019 1112   PROTEINUR 30 (A) 08/03/2019 1112   NITRITE NEGATIVE 08/03/2019 1112   LEUKOCYTESUR NEGATIVE 08/03/2019 1112   Sepsis Labs: @LABRCNTIP (procalcitonin:4,lacticidven:4)  ) Recent Results (from the past 240 hour(s))  Blood culture (routine x 2)     Status: None (Preliminary result)   Collection Time: 08/03/19 12:00 PM   Specimen: BLOOD LEFT HAND  Result Value Ref Range Status   Specimen Description   Final    BLOOD  LEFT HAND Performed at [facility], [place] [address] [place], [place] [address]    Special Requests   Final    BOTTLES DRAWN AEROBIC AND ANAEROBIC Blood Culture results may not be optimal due to an inadequate volume of blood received in culture bottles Performed at [facility] [address] [place], [name] [address]    Culture   Final    NO GROWTH < 24 HOURS Performed at [facility] [address], [place], [place] [address]    Report Status PENDING  Incomplete  Blood culture (routine x 2)     Status: None (Preliminary result)   Collection Time: 08/03/19 12:39 PM   Specimen: BLOOD  Result Value Ref Range Status   Specimen Description   Final    BLOOD LEFT ARM Performed at [facility] [address], [place], [place] [address]    Special Requests   Final    BOTTLES DRAWN AEROBIC AND ANAEROBIC Blood Culture results may not be optimal due to an inadequate volume of blood received in culture bottles Performed at [facility] [address] [place], [place] [address]    Culture   Final    NO GROWTH < 24 HOURS Performed at [facility] [address] [place], [place] [address]    Report Status PENDING  Incomplete  SARS CORONAVIRUS 2 (TAT 6-24 HRS) Nasopharyngeal Nasopharyngeal Swab     Status: None   Collection Time: 08/03/19  3:37 PM   Specimen: Nasopharyngeal Swab  Result Value Ref Range Status   SARS Coronavirus 2 NEGATIVE NEGATIVE Final    Comment: (NOTE) SARS-CoV-2 target nucleic acids are NOT DETECTED. The SARS-CoV-2 RNA is generally detectable in upper and lower respiratory specimens during the acute phase of infection. Negative results do not preclude SARS-CoV-2 infection, do not rule out co-infections with other pathogens, and should not be used as the sole basis for treatment or other patient management decisions. Negative results must be combined with clinical  observations, patient history, and epidemiological information. The expected result is Negative. Fact Sheet for Patients: SugarRoll.be Fact Sheet for Healthcare Providers: https://www.woods-mathews.com/ This test is not yet approved or cleared by the [place] FDA and  has been authorized for detection and/or diagnosis of SARS-CoV-2 by FDA under an Emergency Use Authorization (EUA). This EUA will remain  in effect (meaning this test can be used) for the duration of the COVID-19 declaration under Section 56 4(b)(1) of the Act, 21 U.S.C. section 360bbb-3(b)(1), unless the authorization is terminated or revoked sooner. Performed at [facility], [place] [address], [place], [place] [address]       Studies: DG [facility] 1 [address]  Result  Date: 08/03/2019 CLINICAL DATA:  Shortness of breath.  Hypoxia. EXAM: PORTABLE CHEST 1 VIEW COMPARISON:  07/31/2019.  CT 07/23/2019. FINDINGS: Mediastinum hilar structures normal. Persistent atelectasis and infiltrate right lung base. Persistent cavitary changes right lung base. Persistent right-sided pleural effusion. Similar findings noted on prior exam. Symmetric single nodular opacities noted over the chest bilaterally consistent nipple shadows. Stable cardiomegaly. No acute bony abnormality. IMPRESSION: Persistent right base atelectasis and infiltrate. Persistent cavitary changes right lung base. Persistent right-sided pleural effusion. No significant interim change from prior exam. Electronically Signed   By: [place]   On: 08/03/2019 11:22    Scheduled Meds: . sodium chloride  250 mL Intravenous Once  . folic acid  1 mg Oral Daily  . metoprolol tartrate  25 mg Oral BID  . rivaroxaban  20 mg Oral Q supper    Continuous Infusions: . ceFEPime (MAXIPIME) IV 2 g (08/04/19 0251)  . fluconazole (DIFLUCAN) IV    . vancomycin       LOS: 1 day     Cristal Deer, MD [facility]  To  reach me or the doctor on call, go to: [email or web address] Password [facility]  08/04/2019, 9:18 AM

## 2019-08-05 LAB — CBC WITH DIFFERENTIAL/PLATELET
Abs Immature Granulocytes: 0.02 10*3/uL (ref 0.00–0.07)
Basophils Absolute: 0 10*3/uL (ref 0.0–0.1)
Basophils Relative: 0 %
Eosinophils Absolute: 0 10*3/uL (ref 0.0–0.5)
Eosinophils Relative: 10 %
HCT: 32.2 % — ABNORMAL LOW (ref 39.0–52.0)
Hemoglobin: 10.5 g/dL — ABNORMAL LOW (ref 13.0–17.0)
Immature Granulocytes: 10 %
Lymphocytes Relative: 55 %
Lymphs Abs: 0.1 10*3/uL — ABNORMAL LOW (ref 0.7–4.0)
MCH: 27.5 pg (ref 26.0–34.0)
MCHC: 32.6 g/dL (ref 30.0–36.0)
MCV: 84.3 fL (ref 80.0–100.0)
Monocytes Absolute: 0 10*3/uL — ABNORMAL LOW (ref 0.1–1.0)
Monocytes Relative: 20 %
Neutro Abs: 0 10*3/uL — ABNORMAL LOW (ref 1.7–7.7)
Neutrophils Relative %: 5 %
Platelets: 7 10*3/uL — CL (ref 150–400)
RBC: 3.82 MIL/uL — ABNORMAL LOW (ref 4.22–5.81)
RDW: 16.9 % — ABNORMAL HIGH (ref 11.5–15.5)
WBC: 0.2 10*3/uL — CL (ref 4.0–10.5)
nRBC: 0 % (ref 0.0–0.2)

## 2019-08-05 LAB — BASIC METABOLIC PANEL
Anion gap: 11 (ref 5–15)
BUN: 20 mg/dL (ref 6–20)
CO2: 30 mmol/L (ref 22–32)
Calcium: 7.7 mg/dL — ABNORMAL LOW (ref 8.9–10.3)
Chloride: 96 mmol/L — ABNORMAL LOW (ref 98–111)
Creatinine, Ser: 1.06 mg/dL (ref 0.61–1.24)
GFR calc Af Amer: >60 mL/min (ref >60)
GFR calc non Af Amer: >60 mL/min (ref >60)
Glucose, Bld: 95 mg/dL (ref 70–99)
Potassium: 2.6 mmol/L — CL (ref 3.5–5.1)
Sodium: 137 mmol/L (ref 135–145)

## 2019-08-05 LAB — URINE CULTURE: Culture: NO GROWTH

## 2019-08-05 LAB — MAGNESIUM: Magnesium: 2 mg/dL (ref 1.7–2.4)

## 2019-08-05 MED ORDER — ENSURE ENLIVE PO LIQD
237.0000 mL | Freq: Three times a day (TID) | ORAL | Status: DC
  Administered 2019-08-05 – 2019-08-10 (×13): 237 mL via ORAL

## 2019-08-05 MED ORDER — TBO-FILGRASTIM 300 MCG/0.5ML ~~LOC~~ SOSY
300.0000 ug | PREFILLED_SYRINGE | Freq: Every day | SUBCUTANEOUS | Status: DC
  Administered 2019-08-05 – 2019-08-08 (×4): 300 ug via SUBCUTANEOUS
  Filled 2019-08-05 (×4): qty 0.5

## 2019-08-05 MED ORDER — ADULT MULTIVITAMIN W/MINERALS CH
1.0000 | ORAL_TABLET | Freq: Every day | ORAL | Status: DC
  Administered 2019-08-05 – 2019-08-10 (×6): 1 via ORAL
  Filled 2019-08-05 (×6): qty 1

## 2019-08-05 MED ORDER — LIDOCAINE VISCOUS HCL 2 % MT SOLN: 15.0000 mL | OROMUCOSAL | Status: DC | PRN

## 2019-08-05 MED ORDER — FLUCONAZOLE IN SODIUM CHLORIDE 200-0.9 MG/100ML-% IV SOLN
200.0000 mg | INTRAVENOUS | Status: DC
  Administered 2019-08-05 – 2019-08-08 (×4): 200 mg via INTRAVENOUS
  Filled 2019-08-05 (×5): qty 100

## 2019-08-05 MED ORDER — LIDOCAINE VISCOUS HCL 2 % MT SOLN
15.0000 mL | Freq: Three times a day (TID) | OROMUCOSAL | Status: DC
  Administered 2019-08-06 – 2019-08-10 (×8): 15 mL via OROMUCOSAL
  Filled 2019-08-05 (×12): qty 15

## 2019-08-05 MED ORDER — VANCOMYCIN HCL IN DEXTROSE 1-5 GM/200ML-% IV SOLN
1000.0000 mg | INTRAVENOUS | Status: DC
  Administered 2019-08-05 – 2019-08-07 (×3): 1000 mg via INTRAVENOUS
  Filled 2019-08-05 (×4): qty 200

## 2019-08-05 MED ORDER — LIDOCAINE VISCOUS HCL 2 % MT SOLN
15.0000 mL | OROMUCOSAL | Status: DC | PRN
  Administered 2019-08-05 – 2019-08-06 (×2): 15 mL via OROMUCOSAL

## 2019-08-05 MED ORDER — POTASSIUM CHLORIDE 10 MEQ/100ML IV SOLN
10.0000 meq | INTRAVENOUS | Status: AC
  Administered 2019-08-05 (×4): 10 meq via INTRAVENOUS
  Filled 2019-08-05 (×3): qty 100

## 2019-08-05 NOTE — Progress Notes (Addendum)
PROGRESS NOTE  Tyler [name] GHW:[id number] DOB: 02/22/1961 DOA: 08/03/2019 PCP: Brand Males, MD  HPI/Recap of past 24 hours: Per HPI Tyler [name] is a 59 y.o. male with medical history significant of permanent atrial fibrillation, stage IV non-small cell lung cancer/adenocarcinoma on current chemotherapy, history of pulmonary embolism, EtOH abuse, tobacco abuse who presents from oncology clinic with cough, fever, progressive shortness of breath.  Patient reports onset roughly 2-3 days ago with associated dizziness, fatigue, weakness, especially when standing.  No other specific complaints at this time.  Denies headache, no chest pain, no palpitations, no abdominal pain.   Subjective: Patient seen and examined at bedside.  Nurse stated patient is complaining of pain when he eats despite Magic mouthwash also he is on morphine suspension for pain.    August 05, 2019. Subjective.: Patient seen and examined at bedside he stated that he still having pain in his mouth despite changing his pain medicine to oxycodone.  He also asking for soda I asked him if that would not cause him a problem because of the acidity he said he was going to drink it from the other parts side of his mouth.  Patient does have a deep cough with sputum production.  I will add incentive spirometry  Assessment/Plan: Principal Problem:   Acute respiratory failure with hypoxia (HCC) Active Problems:   Protein-calorie malnutrition, severe   Adenocarcinoma of right lung, stage 3 (HCC)   Febrile neutropenia (HCC)   Persistent atrial fibrillation (HCC)   History of pulmonary embolism  Acute respiratory failure with hypoxia   Patient was admitted from oncology clinic with progressive shortness of breath and weakness and fatigue and fever at home is currently on chemotherapy. He was started on supplemental oxygen His COVID-19 PCR was negative Continue supportive care   Symptomatic anemia. Pancytopenia This  is likely from active chemotherapy there is however no sign of bleeding. He received 2 unit of packed RBC. We will continue supportive care with oxygen Monitor and transfuse as needed  Neutropenic fever Patient has neutropenic fever due to chemotherapy he is started on antibiotic empirically with vancomycin and cefepime. Pharmacy is titrating and monitoring. His chest x-ray was concerning for right basilar infiltrate We will continue to follow blood cultures and urine cultures  Concern for esophageal candidiasis Patient has been have a lot of pain and difficulty swallowing doing due to this he thinks that the Magic mouthwash is making it worse. We will continue fluconazole and Magic mouthwash also will add viscous lidocaine and pain management if needed Patient continued to have pain orally still his morphine solution was not helping he wanted to change it to oxycodone which he said he was on at home. This was changed  Stage IV adenocarcinoma right lung Patient is followed up by medical oncology Dr. [name]. He is still on chemotherapy. He will continue chemotherapy outpatient once discharged.  Permanent atrial fibrillation -Continue metoprolol 25 mg twice daily Continue to hold Xarelto 20 mg daily due to neutropenia   Hx pulmonary embolism Continue to hold Xarelto 20 mg daily due to neutropenia  Neutropenia.  Patient was started on Granix.  We will continue daily Granix and monitoring of CBC until unacceptable level is reached.  Pharmacy is also monitoring   Multiple oral ulcer and mouth pain.  Patient is currently on Magic mouthwash.  I will add viscous lidocaine as needed before meals  Code Status: Full  Severity of Illness: The appropriate patient status for this patient is INPATIENT.  Inpatient status is judged to be reasonable and necessary in order to provide the required intensity of service to ensure the patient's safety. The patient's presenting symptoms, physical exam  findings, and initial radiographic and laboratory data in the context of their chronic comorbidities is felt to place them at high risk for further clinical deterioration. Furthermore, it is not anticipated that the patient will be medically stable for discharge from the hospital within 2 midnights of admission. The following factors support the patient status of inpatient.  Adenocarcinoma requiring IV antibiotics  * I certify that at the point of admission it is my clinical judgment that the patient will require inpatient hospital care spanning beyond 2 midnights from the point of admission due to high intensity of service, high risk for further deterioration and high frequency of surveillance required.*    Family Communication: None at bedside  Disposition Plan: Home   Consultants:  Oncology  Procedures:  None  Antimicrobials:  Cefepime  Vancomycin  DVT prophylaxis: Xarelto   Objective: Vitals:   08/04/19 2043 08/04/19 2256 08/04/19 2319 08/05/19 0647  BP: 115/83   118/81  Pulse: 61 (!) 122 99 99  Resp:      Temp: 97.8 F (36.6 C)   97.8 F (36.6 C)  TempSrc: Oral   Oral  SpO2: 100%   100%  Weight:      Height:        Intake/Output Summary (Last 24 hours) at 08/05/2019 0922 Last data filed at 08/05/2019 0717 Gross per 24 hour  Intake 491.17 ml  Output 625 ml  Net -133.83 ml   Filed Weights   08/03/19 1135  Weight: 51.3 kg   Body mass index is 14.51 kg/m.  Exam:  . General: 59 y.o. year-old male well developed well nourished in no acute distress.  Alert and oriented x3.  Chronically ill looking . HEENT.  Swollen lip ulcers in the oral cavity . Cardiovascular: Regular rate and rhythm with no rubs or gallops.  No thyromegaly or JVD noted.   Marland Kitchen Respiratory: Clear to auscultation with no wheezes or rales. Good inspiratory effort. . Abdomen: Soft nontender nondistended with normal bowel sounds x4 quadrants. . Musculoskeletal: No lower extremity edema. 2/4  pulses in all 4 extremities. . Skin: No ulcerative lesions noted or rashes, . Psychiatry: Mood is appropriate for condition and setting    Data Reviewed: CBC: Recent Labs  Lab 07/31/19 1125 07/31/19 1433 08/03/19 1204 08/04/19 0247 08/05/19 0251  WBC 1.8* 1.8* 0.4* 0.3* 0.2*  NEUTROABS 1.2* 1.3*  --  0.0* 0.0*  HGB 8.2* 9.3* 7.7* 9.7* 10.5*  HCT 25.2* 29.1* 24.4* 28.8* 32.2*  MCV 80.8 82.7 84.1 80.2 84.3  PLT 88* 95* 21* 8* 7*   Basic Metabolic Panel: Recent Labs  Lab 07/31/19 1125 07/31/19 1433 08/03/19 1204 08/04/19 0247 08/05/19 0251  NA 136 137 134* 131* 137  K 2.7* 2.7* 3.5 3.0* 2.6*  CL 89* 88* 88* 92* 96*  CO2 34* 36* 31 27 30   GLUCOSE 129* 88 139* 82 95  BUN 14 16 17 18 20   CREATININE 1.01 1.13 1.29* 1.17 1.06  CALCIUM 7.6* 8.3* 7.8* 7.0* 7.7*  MG  --   --   --  0.9* 2.0   GFR: Estimated Creatinine Clearance: 53.8 mL/min (by C-G formula based on SCr of 1.06 mg/dL). Liver Function Tests: Recent Labs  Lab 07/31/19 1125 08/03/19 1204  AST 26 31  ALT 11 12  ALKPHOS 99 78  BILITOT 0.6 1.2  PROT 7.2 7.7  ALBUMIN 2.4* 2.7*   No results for input(s): LIPASE, AMYLASE in the last 168 hours. No results for input(s): AMMONIA in the last 168 hours. Coagulation Profile: No results for input(s): INR, PROTIME in the last 168 hours. Cardiac Enzymes: No results for input(s): CKTOTAL, CKMB, CKMBINDEX, TROPONINI in the last 168 hours. BNP (last 3 results) No results for input(s): PROBNP in the last 8760 hours. HbA1C: No results for input(s): HGBA1C in the last 72 hours. CBG: No results for input(s): GLUCAP in the last 168 hours. Lipid Profile: No results for input(s): CHOL, HDL, LDLCALC, TRIG, CHOLHDL, LDLDIRECT in the last 72 hours. Thyroid Function Tests: No results for input(s): TSH, T4TOTAL, FREET4, T3FREE, THYROIDAB in the last 72 hours. Anemia Panel: No results for input(s): VITAMINB12, FOLATE, FERRITIN, TIBC, IRON, RETICCTPCT in the last 72  hours. Urine analysis:    Component Value Date/Time   COLORURINE YELLOW 08/03/2019 1112   APPEARANCEUR HAZY (A) 08/03/2019 1112   LABSPEC 1.014 08/03/2019 1112   PHURINE 5.0 08/03/2019 1112   GLUCOSEU NEGATIVE 08/03/2019 1112   HGBUR NEGATIVE 08/03/2019 1112   BILIRUBINUR NEGATIVE 08/03/2019 1112   KETONESUR NEGATIVE 08/03/2019 1112   PROTEINUR 30 (A) 08/03/2019 1112   NITRITE NEGATIVE 08/03/2019 1112   LEUKOCYTESUR NEGATIVE 08/03/2019 1112   Sepsis Labs: @LABRCNTIP (procalcitonin:4,lacticidven:4)  ) Recent Results (from the past 240 hour(s))  Blood culture (routine x 2)     Status: None (Preliminary result)   Collection Time: 08/03/19 12:00 PM   Specimen: BLOOD LEFT HAND  Result Value Ref Range Status   Specimen Description   Final    BLOOD LEFT HAND Performed at [facility], [place] [address], [place], [place] [address]    Special Requests   Final    BOTTLES DRAWN AEROBIC AND ANAEROBIC Blood Culture results may not be optimal due to an inadequate volume of blood received in culture bottles Performed at [facility] [address], [place], [place] [address]    Culture   Final    NO GROWTH < 24 HOURS Performed at [facility] [address] [place], [place] [address]    Report Status PENDING  Incomplete  Blood culture (routine x 2)     Status: None (Preliminary result)   Collection Time: 08/03/19 12:39 PM   Specimen: BLOOD  Result Value Ref Range Status   Specimen Description   Final    BLOOD LEFT ARM Performed at [facility] [address], [place], [place] [address]    Special Requests   Final    BOTTLES DRAWN AEROBIC AND ANAEROBIC Blood Culture results may not be optimal due to an inadequate volume of blood received in culture bottles Performed at [facility] [address] [place], [place] [address]    Culture   Final    NO GROWTH < 24 HOURS Performed at [facility] [address] [place], [place] [address]    Report Status PENDING  Incomplete  MRSA PCR Screening     Status: None   Collection Time: 08/03/19  3:24 PM   Specimen: Nasal Mucosa; Nasopharyngeal  Result Value Ref Range Status   MRSA by PCR NEGATIVE NEGATIVE Final    Comment:        The GeneXpert MRSA Assay (FDA approved for NASAL specimens only), is one component of a comprehensive MRSA colonization surveillance program. It is not intended to diagnose MRSA infection nor to guide or  monitor treatment for MRSA infections. Performed at [facility], [place] [address], [place], [place] [address]   SARS CORONAVIRUS 2 (TAT 6-24 HRS) Nasopharyngeal Nasopharyngeal Swab     Status: None   Collection Time: 08/03/19  3:37 PM   Specimen: Nasopharyngeal Swab  Result Value Ref Range Status   SARS Coronavirus 2 NEGATIVE NEGATIVE Final    Comment: (NOTE) SARS-CoV-2 target nucleic acids are NOT DETECTED. The SARS-CoV-2 RNA is generally detectable in upper and lower respiratory specimens during the acute phase of infection. Negative results do not preclude SARS-CoV-2 infection, do not rule out co-infections with other pathogens, and should not be used as the sole basis for treatment or other patient management decisions. Negative results must be combined with clinical observations, patient history, and epidemiological information. The expected result is Negative. Fact Sheet for Patients: SugarRoll.be Fact Sheet for Healthcare Providers: https://www.woods-mathews.com/ This test is not yet approved or cleared by the [place] FDA and  has been authorized for detection and/or diagnosis of SARS-CoV-2 by FDA under an Emergency Use Authorization (EUA). This EUA will remain  in effect (meaning this test can be used) for the duration of the COVID-19 declaration under Section 56 4(b)(1) of the Act, 21 U.S.C. section 360bbb-3(b)(1), unless  the authorization is terminated or revoked sooner. Performed at [facility], [place] [address] [place], [place] [address]       Studies: No results found.  Scheduled Meds: . sodium chloride  250 mL Intravenous Once  . feeding supplement (ENSURE ENLIVE)  237 mL Oral TID BM  . folic acid  1 mg Oral Daily  . metoprolol tartrate  25 mg Oral BID  . multivitamin with minerals  1 tablet Oral Daily    Continuous Infusions: . ceFEPime (MAXIPIME) IV 2 g (08/05/19 0537)  . fluconazole (DIFLUCAN) IV 100 mg (08/04/19 1806)  . potassium chloride 10 mEq (08/05/19 0850)  . vancomycin 750 mg (08/04/19 1657)     LOS: 2 days     Cristal Deer, MD [facility]  To reach me or the doctor on call, go to: [email or web address] Password [email or web address]  08/05/2019, 9:22 AM

## 2019-08-05 NOTE — Progress Notes (Signed)
Pharmacy Antibiotic Note  Tyler [name] is a 59 y.o. male admitted on 08/03/2019 with febrile neutropenia, esophageal candidiasis.  Pharmacy has been consulted for Vancomycin, Cefepime, and Fluconazole dosing.  Plan: Adjust Vancomycin to 1g IV q24h Continue Cefepime 2g IV q12h Adjust Fluconazole to 200mg  IV q24h  Monitor renal function, cultures, clinical course, and for de-escalation of therapy   Height: 6\' 2"  (188 cm) Weight: 116 lb (52.6 kg) IBW/kg (Calculated) : 82.2  Temp (24hrs), Avg:97.9 F (36.6 C), Min:97.8 F (36.6 C), Max:98 F (36.7 C)  Recent Labs  Lab 07/31/19 1125 07/31/19 1433 08/03/19 1204 08/03/19 1635 08/03/19 1931 08/04/19 0247 08/05/19 0251  WBC 1.8* 1.8* 0.4*  --   --  0.3* 0.2*  CREATININE 1.01 1.13 1.29*  --   --  1.17 1.06  LATICACIDVEN  --   --   --  2.6* 1.2  --   --     Estimated Creatinine Clearance: 55.1 mL/min (by C-G formula based on SCr of 1.06 mg/dL).    No Known Allergies   Antimicrobials this admission: 1/22 Cefepime >>  1/22 Vancomycin >>  1/22 Fluconazole >>  Microbiology results: 1/22 BCx: NGTD 1/22 UCx: NGF 1/22 COVID: neg 1/22 MRSA PCR: neg  Thank you for allowing pharmacy to be a part of this patient's care.   Lindell Spar, PharmD, BCPS Clinical Pharmacist  08/05/2019 2:19 PM

## 2019-08-05 NOTE — Progress Notes (Signed)
Critical Lab- Potassium 2.6 Provider made aware. Will continue to monitor.

## 2019-08-05 NOTE — Progress Notes (Signed)
Initial Nutrition Assessment  RD working remotely.  DOCUMENTATION CODES:   Underweight  INTERVENTION:   -Downgrade diet to dysphagia 3 diet (advanced mechanical soft) for ease of intake due to poor dentition -MVI with minerals daily -Ensure Enlive po TID, each supplement provides 350 kcal and 20 grams of protein  NUTRITION DIAGNOSIS:   Increased nutrient needs related to cancer and cancer related treatments as evidenced by estimated needs.  GOAL:   Patient will meet greater than or equal to 90% of their needs  MONITOR:   PO intake, Supplement acceptance, Labs, Weight trends, Skin, I & O's  REASON FOR ASSESSMENT:   Consult, Malnutrition Screening Tool Assessment of nutrition requirement/status  ASSESSMENT:   Tyler [name] is a 59 y.o. male with medical history significant of permanent atrial fibrillation, stage IV non-small cell lung cancer/adenocarcinoma on current chemotherapy, history of pulmonary embolism, EtOH abuse, tobacco abuse who presents from oncology clinic with cough, fever, progressive shortness of breath.  Patient reports onset roughly 2-3 days ago with associated dizziness, fatigue, weakness, especially when standing.  No other specific complaints at this time.  Denies headache, no chest pain, no palpitations, no abdominal pain.  Pt admitted with acute respiratory failure with hypoxia.   Reviewed I/O's: -309 ml x 24 hours and +2.6 L since admission  UOP: 800 ml x 24 hours  Attempted to speak with pt via phone x 2, however, no answer. RD unable to obtain further nutrition-related history at this time.   Pt with very poor meal intake; noted meal completion documented at 15%.   Reviewed wt hx; pt has experienced a 4.6% wt loss in the past month. While this is not significant for time frame, it is concerning given pt's stage IV lung cancer (currently undergoing chemotherapy) and underweight status. RD highly suspects pt with malnutrition, however, unable to  identify at this time.   Per outpatient oncology RD notes, pt with history of poor appetite with difficulty chewing foods due to poor dentition. Pt has been on hospice in the past (discharged to [facility] in 06/2019), however, pt discharged himself from hospice. He has been provided with Ensure from the cancer center.    Labs reviewed: K: 2.6 (on IV supplementation).   Diet Order:   Diet Order            Diet Heart Room service appropriate? Yes; Fluid consistency: Thin  Diet effective now              EDUCATION NEEDS:   No education needs have been identified at this time  Skin:  Skin Assessment: Reviewed RN Assessment  Last BM:  08/04/19  Height:   Ht Readings from Last 1 Encounters:  08/03/19 6\' 2"  (1.88 m)    Weight:   Wt Readings from Last 1 Encounters:  08/03/19 51.3 kg    Ideal Body Weight:  86.4 kg  BMI:  Body mass index is 14.51 kg/m.  Estimated Nutritional Needs:   Kcal:  1850-2050  Protein:  105-120 grams  Fluid:  > 1.8 L    Tige Meas A. Jimmye Norman, RD, LDN, [place] Registered Dietitian II Certified Diabetes Care and Education Specialist Pager: [phone] After hours Pager: [phone]

## 2019-08-06 DIAGNOSIS — J9601 Acute respiratory failure with hypoxia: Secondary | ICD-10-CM

## 2019-08-06 LAB — CBC WITH DIFFERENTIAL/PLATELET
Abs Immature Granulocytes: 0.01 10*3/uL (ref 0.00–0.07)
Basophils Absolute: 0 10*3/uL (ref 0.0–0.1)
Basophils Relative: 0 %
Eosinophils Absolute: 0 10*3/uL (ref 0.0–0.5)
Eosinophils Relative: 7 %
HCT: 30.7 % — ABNORMAL LOW (ref 39.0–52.0)
HCT: 28.1 % — ABNORMAL LOW (ref 39.0–52.0)
Hemoglobin: 9.9 g/dL — ABNORMAL LOW (ref 13.0–17.0)
Hemoglobin: 9 g/dL — ABNORMAL LOW (ref 13.0–17.0)
Immature Granulocytes: 2 %
Lymphocytes Relative: 40 %
Lymphs Abs: 0.2 10*3/uL — ABNORMAL LOW (ref 0.7–4.0)
MCH: 27.5 pg (ref 26.0–34.0)
MCH: 27.3 pg (ref 26.0–34.0)
MCHC: 32.2 g/dL (ref 30.0–36.0)
MCHC: 32 g/dL (ref 30.0–36.0)
MCV: 85.3 fL (ref 80.0–100.0)
MCV: 85.2 fL (ref 80.0–100.0)
Monocytes Absolute: 0.1 10*3/uL (ref 0.1–1.0)
Monocytes Relative: 24 %
Neutro Abs: 0.1 10*3/uL — ABNORMAL LOW (ref 1.7–7.7)
Neutrophils Relative %: 27 %
Platelets: <5 10*3/uL — CL (ref 150–400)
Platelets: 44 10*3/uL — ABNORMAL LOW (ref 150–400)
RBC: 3.6 MIL/uL — ABNORMAL LOW (ref 4.22–5.81)
RBC: 3.3 MIL/uL — ABNORMAL LOW (ref 4.22–5.81)
RDW: 16.6 % — ABNORMAL HIGH (ref 11.5–15.5)
RDW: 16.5 % — ABNORMAL HIGH (ref 11.5–15.5)
WBC: 0.5 10*3/uL — CL (ref 4.0–10.5)
WBC: 0.5 10*3/uL — CL (ref 4.0–10.5)
nRBC: 0 % (ref 0.0–0.2)
nRBC: 0 % (ref 0.0–0.2)

## 2019-08-06 LAB — BASIC METABOLIC PANEL
Anion gap: 12 (ref 5–15)
BUN: 19 mg/dL (ref 6–20)
CO2: 32 mmol/L (ref 22–32)
Calcium: 8.1 mg/dL — ABNORMAL LOW (ref 8.9–10.3)
Chloride: 92 mmol/L — ABNORMAL LOW (ref 98–111)
Creatinine, Ser: 1.14 mg/dL (ref 0.61–1.24)
GFR calc Af Amer: >60 mL/min (ref >60)
GFR calc non Af Amer: >60 mL/min (ref >60)
Glucose, Bld: 88 mg/dL (ref 70–99)
Potassium: 3.1 mmol/L — ABNORMAL LOW (ref 3.5–5.1)
Sodium: 136 mmol/L (ref 135–145)

## 2019-08-06 MED ORDER — ACETAMINOPHEN 325 MG PO TABS
650.0000 mg | ORAL_TABLET | Freq: Once | ORAL | Status: AC
  Administered 2019-08-06: 650 mg via ORAL
  Filled 2019-08-06: qty 2

## 2019-08-06 MED ORDER — SODIUM CHLORIDE 0.9% IV SOLUTION: Freq: Once | INTRAVENOUS | Status: AC

## 2019-08-06 MED ORDER — DIPHENHYDRAMINE HCL 25 MG PO CAPS
25.0000 mg | ORAL_CAPSULE | Freq: Once | ORAL | Status: AC
  Administered 2019-08-06: 25 mg via ORAL
  Filled 2019-08-06: qty 1

## 2019-08-06 NOTE — Progress Notes (Signed)
Patient refused to have his labs drawn, writer explained the importance. Pt stated he would prefer them drawn after 0600. Will continue to monitor.

## 2019-08-06 NOTE — Progress Notes (Signed)
Pt heart rate 120 making him a MEWS 2 no acute changes MD aware.

## 2019-08-06 NOTE — Progress Notes (Addendum)
HEMATOLOGY-ONCOLOGY PROGRESS NOTE  SUBJECTIVE: Has bleeding from his lips this morning. Denies any epistaxis, hematuria, or melena. Reports sore throat and difficulty swallowing. Only able to take in liquids. Denies chest pain and shortness of breath. TMax 99.3 in the past 24 hours.   Oncology History  Adenocarcinoma of right lung, stage 3 ([place])  01/17/2019 Initial Diagnosis   Adenocarcinoma of right lung, stage 3 ([place])   01/29/2019 - 03/18/2019 Chemotherapy   The patient had palonosetron (ALOXI) injection 0.25 mg, 0.25 mg, Intravenous,  Once, 7 of 7 cycles Administration: 0.25 mg (01/29/2019), 0.25 mg (02/26/2019), 0.25 mg (03/05/2019), 0.25 mg (02/05/2019), 0.25 mg (03/12/2019), 0.25 mg (02/12/2019), 0.25 mg (02/19/2019) CARBOplatin (PARAPLATIN) 220 mg in sodium chloride 0.9 % 250 mL chemo infusion, 220 mg (100 % of original dose 222.2 mg), Intravenous,  Once, 7 of 7 cycles Dose modification: 222.2 mg (original dose 222.2 mg, Cycle 1) Administration: 220 mg (01/29/2019), 220 mg (02/26/2019), 220 mg (03/05/2019), 220 mg (03/12/2019), 220 mg (02/12/2019), 220 mg (02/19/2019) PACLitaxel (TAXOL) 84 mg in sodium chloride 0.9 % 250 mL chemo infusion (</= 80mg /m2), 45 mg/m2 = 84 mg, Intravenous,  Once, 7 of 7 cycles Administration: 84 mg (01/29/2019), 84 mg (02/26/2019), 84 mg (03/05/2019), 84 mg (02/05/2019), 84 mg (03/12/2019), 84 mg (02/12/2019), 84 mg (02/19/2019)  for chemotherapy treatment.    05/22/2019 -  Chemotherapy   The patient had palonosetron (ALOXI) injection 0.25 mg, 0.25 mg, Intravenous,  Once, 4 of 4 cycles Administration: 0.25 mg (05/22/2019), 0.25 mg (07/03/2019), 0.25 mg (07/24/2019) PEMEtrexed (ALIMTA) 900 mg in sodium chloride 0.9 % 100 mL chemo infusion, 517 mg/m2 = 850 mg, Intravenous,  Once, 4 of 6 cycles Administration: 900 mg (05/22/2019), 900 mg (07/03/2019), 800 mg (07/24/2019), 900 mg (06/12/2019) CARBOplatin (PARAPLATIN) 520 mg in sodium chloride 0.9 % 250 mL chemo infusion, 520 mg (100 % of  original dose 517.5 mg), Intravenous,  Once, 4 of 4 cycles Dose modification: 517.5 mg (original dose 517.5 mg, Cycle 1), 517.5 mg (original dose 517.5 mg, Cycle 3), 517.5 mg (original dose 517.5 mg, Cycle 4), 517.5 mg (original dose 517.5 mg, Cycle 2) Administration: 520 mg (05/22/2019), 520 mg (07/03/2019), 520 mg (07/24/2019), 520 mg (06/12/2019) pembrolizumab (KEYTRUDA) 200 mg in sodium chloride 0.9 % 50 mL chemo infusion, 200 mg, Intravenous, Once, 4 of 6 cycles Administration: 200 mg (05/22/2019), 200 mg (07/03/2019), 200 mg (07/24/2019), 200 mg (06/12/2019) fosaprepitant (EMEND) 150 mg, dexamethasone (DECADRON) 12 mg in sodium chloride 0.9 % 145 mL IVPB, , Intravenous,  Once, 4 of 4 cycles Administration:  (05/22/2019),  (07/03/2019),  (07/24/2019),  (06/12/2019)  for chemotherapy treatment.    Adenocarcinoma of right lung, stage 4 ([place])  05/15/2019 Initial Diagnosis   Adenocarcinoma of right lung, stage 4 ([place])   05/22/2019 -  Chemotherapy   The patient had palonosetron (ALOXI) injection 0.25 mg, 0.25 mg, Intravenous,  Once, 4 of 4 cycles Administration: 0.25 mg (05/22/2019), 0.25 mg (07/03/2019), 0.25 mg (07/24/2019) PEMEtrexed (ALIMTA) 900 mg in sodium chloride 0.9 % 100 mL chemo infusion, 517 mg/m2 = 850 mg, Intravenous,  Once, 4 of 6 cycles Administration: 900 mg (05/22/2019), 900 mg (07/03/2019), 800 mg (07/24/2019), 900 mg (06/12/2019) CARBOplatin (PARAPLATIN) 520 mg in sodium chloride 0.9 % 250 mL chemo infusion, 520 mg (100 % of original dose 517.5 mg), Intravenous,  Once, 4 of 4 cycles Dose modification: 517.5 mg (original dose 517.5 mg, Cycle 1), 517.5 mg (original dose 517.5 mg, Cycle 3), 517.5 mg (original dose 517.5 mg, Cycle 4),  517.5 mg (original dose 517.5 mg, Cycle 2) Administration: 520 mg (05/22/2019), 520 mg (07/03/2019), 520 mg (07/24/2019), 520 mg (06/12/2019) pembrolizumab (KEYTRUDA) 200 mg in sodium chloride 0.9 % 50 mL chemo infusion, 200 mg, Intravenous, Once, 4 of 6  cycles Administration: 200 mg (05/22/2019), 200 mg (07/03/2019), 200 mg (07/24/2019), 200 mg (06/12/2019) fosaprepitant (EMEND) 150 mg, dexamethasone (DECADRON) 12 mg in sodium chloride 0.9 % 145 mL IVPB, , Intravenous,  Once, 4 of 4 cycles Administration:  (05/22/2019),  (07/03/2019),  (07/24/2019),  (06/12/2019)  for chemotherapy treatment.       REVIEW OF SYSTEMS:   Constitutional: Denies fevers, chills Respiratory: Denies cough, dyspnea or wheezes Cardiovascular: Denies palpitation, chest discomfort Gastrointestinal:  Denies nausea, heartburn or change in bowel habits Skin: Denies abnormal skin rashes Lymphatics: Denies new lymphadenopathy Neurological:Denies numbness, tingling or new weaknesses Behavioral/Psych: Mood is stable, no new changes  Extremities: No lower extremity edema All other systems were reviewed with the patient and are negative.  I have reviewed the past medical history, past surgical history, social history and family history with the patient and they are unchanged from previous note.   PHYSICAL EXAMINATION: ECOG PERFORMANCE STATUS: 2 - Symptomatic, <50% confined to bed  Vitals:   08/06/19 0403 08/06/19 0818  BP: 104/72   Pulse: 83 (!) 120  Resp: 18   Temp: 99.3 F (37.4 C)   SpO2: 99%    Filed Weights   08/03/19 1135 08/05/19 1336  Weight: 113 lb (51.3 kg) 116 lb (52.6 kg)    Intake/Output from previous day: 01/24 0701 - 01/25 0700 In: 1785 [P.O.:1185; IV Piggyback:600] Out: 3235 [Urine:1450]  GENERAL:Cachectic, no distress OROPHARYNX: Bleeding noted to lips. Mucositis noted.   LUNGS: clear to auscultation and percussion with normal breathing effort HEART: Tachycardic, no lower extremity edema ABDOMEN:abdomen soft, non-tender and normal bowel sounds Musculoskeletal:no cyanosis of digits and no clubbing  NEURO: alert & oriented x 3 with fluent speech, no focal motor/sensory deficits  LABORATORY DATA:  I have reviewed the data as listed CMP  Latest Ref Rng & Units 08/06/2019 08/05/2019 08/04/2019  Glucose 70 - 99 mg/dL 88 95 82  BUN 6 - 20 mg/dL 19 20 18   Creatinine 0.61 - 1.24 mg/dL 1.14 1.06 1.17  Sodium 135 - 145 mmol/L 136 137 131(L)  Potassium 3.5 - 5.1 mmol/L 3.1(L) 2.6(LL) 3.0(L)  Chloride 98 - 111 mmol/L 92(L) 96(L) 92(L)  CO2 22 - 32 mmol/L 32 30 27  Calcium 8.9 - 10.3 mg/dL 8.1(L) 7.7(L) 7.0(L)  Total Protein 6.5 - 8.1 g/dL - - -  Total Bilirubin 0.3 - 1.2 mg/dL - - -  Alkaline Phos 38 - 126 U/L - - -  AST 15 - 41 U/L - - -  ALT 0 - 44 U/L - - -    Lab Results  Component Value Date   WBC 0.5 (LL) 08/06/2019   HGB 9.9 (L) 08/06/2019   HCT 30.7 (L) 08/06/2019   MCV 85.3 08/06/2019   PLT <5 (LL) 08/06/2019   NEUTROABS 0.0 (L) 08/05/2019    CT Chest W Contrast  Result Date: 07/23/2019 CLINICAL DATA:  Stage IV right lower lobe lung adenocarcinoma diagnosed July 2020 status post concurrent chemoradiation therapy with ongoing chemotherapy and immunotherapy. Restaging. EXAM: CT CHEST, ABDOMEN, AND PELVIS WITH CONTRAST TECHNIQUE: Multidetector CT imaging of the chest, abdomen and pelvis was performed following the standard protocol during bolus administration of intravenous contrast. CONTRAST:  65mL OMNIPAQUE IOHEXOL 300 MG/ML  SOLN COMPARISON:  04/29/2019 chest  CT.  12/27/2018 PET-CT. FINDINGS: CT CHEST FINDINGS Cardiovascular: Normal heart size. No significant pericardial effusion/thickening. Left circumflex and right coronary atherosclerosis. Mildly atherosclerotic nonaneurysmal thoracic aorta. Normal caliber pulmonary arteries. No central pulmonary emboli. Mediastinum/Nodes: No discrete thyroid nodules. Unremarkable esophagus. No pathologically enlarged axillary, mediastinal or hilar lymph nodes. Lungs/Pleura: No pneumothorax. Small loculated basilar right pleural effusion, slightly increased from 04/29/2019. Interval removal right pleural chest tube. No left pleural effusion. Mild centrilobular and paraseptal emphysema.  The previously visualized extensive consolidation and patchy ground-glass opacity throughout the left lung on 04/29/2019 chest CT has essentially resolved. Extensive irregular masslike consolidation throughout the right lower lobe measures up to 9.8 x 7.4 cm in maximum axial dimensions and is somewhat sharply marginated (series 4/image 101), increased from 8.0 x 5.8 cm on 04/29/2019. Evolving sharply marginated mild right perihilar radiation fibrosis. New cavitary 2.9 x 2.4 cm focus in the medial right lower lobe (series 4/image 106) with air-fluid level. Dominant 0.8 cm solid apical right upper lobe nodule (series 4/image 27), stable. Additional scattered subcentimeter indistinct sub solid nodules in both lungs are not appreciably changed. No definite new pulmonary nodules. Musculoskeletal: No aggressive appearing focal osseous lesions. Stable small bone island in T3 vertebral body. CT ABDOMEN PELVIS FINDINGS Hepatobiliary: Normal liver with no liver mass. Cholelithiasis. No biliary ductal dilatation. Pancreas: Normal, with no mass or duct dilation. Spleen: Normal size. No mass. Adrenals/Urinary Tract: Normal adrenals. No hydronephrosis. Heterogeneous enhancement of the renal parenchyma in both kidneys without discrete renal masses. Subcentimeter hypodense lower left renal cortical lesion is too small to characterize and unchanged since 12/27/2018 PET-CT, considered benign. Normal bladder. Stomach/Bowel: Normal non-distended stomach. Normal caliber small bowel with no small bowel wall thickening. Appendix not discretely visualized. Moderate sigmoid diverticulosis, with no large bowel wall thickening or significant pericolonic fat stranding. Oral contrast transits to the rectum. Vascular/Lymphatic: Atherosclerotic nonaneurysmal abdominal aorta. Patent portal, splenic, hepatic and renal veins. No pathologically enlarged lymph nodes in the abdomen or pelvis. Reproductive: Normal size prostate. Other: No  pneumoperitoneum. No focal fluid collection. Small volume pelvic ascites. Musculoskeletal: No aggressive appearing focal osseous lesions. Mild lumbar spondylosis. IMPRESSION: 1. Extensive irregular masslike consolidation replacing much of the right lower lobe, somewhat sharply marginated, mildly increased since 04/29/2019 chest CT, indeterminate for evolving radiation fibrosis versus viable tumor. New cavitary focus within the medial right lower lobe with air-fluid level. PET-CT could be considered for further evaluation versus close chest CT follow-up in 3 months. 2. Scattered ill-defined subcentimeter pulmonary nodules are not definitely changed. Previously visualized left lung opacities have largely resolved. 3. No pathologically enlarged thoracic nodes. No evidence of metastatic disease in the abdomen or pelvis. 4. Small loculated right pleural effusion. Interval removal of right chest tube. 5. Small volume pelvic ascites. 6. Nonspecific heterogeneous enhancement of the kidneys bilaterally. No hydronephrosis. Findings could represent medical renal disease or pyelonephritis. No evidence of renal abscess. 7. Aortic Atherosclerosis (ICD10-I70.0) and Emphysema (ICD10-J43.9). These results will be called to the ordering clinician or representative by the Radiologist Assistant, and communication documented in the PACS or zVision Dashboard. Electronically Signed   By: Ilona Sorrel M.D.   On: 07/23/2019 11:09   CT Abdomen Pelvis W Contrast  Result Date: 07/23/2019 CLINICAL DATA:  Stage IV right lower lobe lung adenocarcinoma diagnosed July 2020 status post concurrent chemoradiation therapy with ongoing chemotherapy and immunotherapy. Restaging. EXAM: CT CHEST, ABDOMEN, AND PELVIS WITH CONTRAST TECHNIQUE: Multidetector CT imaging of the chest, abdomen and pelvis was performed following the standard protocol  during bolus administration of intravenous contrast. CONTRAST:  28mL OMNIPAQUE IOHEXOL 300 MG/ML  SOLN  COMPARISON:  04/29/2019 chest CT.  12/27/2018 PET-CT. FINDINGS: CT CHEST FINDINGS Cardiovascular: Normal heart size. No significant pericardial effusion/thickening. Left circumflex and right coronary atherosclerosis. Mildly atherosclerotic nonaneurysmal thoracic aorta. Normal caliber pulmonary arteries. No central pulmonary emboli. Mediastinum/Nodes: No discrete thyroid nodules. Unremarkable esophagus. No pathologically enlarged axillary, mediastinal or hilar lymph nodes. Lungs/Pleura: No pneumothorax. Small loculated basilar right pleural effusion, slightly increased from 04/29/2019. Interval removal right pleural chest tube. No left pleural effusion. Mild centrilobular and paraseptal emphysema. The previously visualized extensive consolidation and patchy ground-glass opacity throughout the left lung on 04/29/2019 chest CT has essentially resolved. Extensive irregular masslike consolidation throughout the right lower lobe measures up to 9.8 x 7.4 cm in maximum axial dimensions and is somewhat sharply marginated (series 4/image 101), increased from 8.0 x 5.8 cm on 04/29/2019. Evolving sharply marginated mild right perihilar radiation fibrosis. New cavitary 2.9 x 2.4 cm focus in the medial right lower lobe (series 4/image 106) with air-fluid level. Dominant 0.8 cm solid apical right upper lobe nodule (series 4/image 27), stable. Additional scattered subcentimeter indistinct sub solid nodules in both lungs are not appreciably changed. No definite new pulmonary nodules. Musculoskeletal: No aggressive appearing focal osseous lesions. Stable small bone island in T3 vertebral body. CT ABDOMEN PELVIS FINDINGS Hepatobiliary: Normal liver with no liver mass. Cholelithiasis. No biliary ductal dilatation. Pancreas: Normal, with no mass or duct dilation. Spleen: Normal size. No mass. Adrenals/Urinary Tract: Normal adrenals. No hydronephrosis. Heterogeneous enhancement of the renal parenchyma in both kidneys without discrete  renal masses. Subcentimeter hypodense lower left renal cortical lesion is too small to characterize and unchanged since 12/27/2018 PET-CT, considered benign. Normal bladder. Stomach/Bowel: Normal non-distended stomach. Normal caliber small bowel with no small bowel wall thickening. Appendix not discretely visualized. Moderate sigmoid diverticulosis, with no large bowel wall thickening or significant pericolonic fat stranding. Oral contrast transits to the rectum. Vascular/Lymphatic: Atherosclerotic nonaneurysmal abdominal aorta. Patent portal, splenic, hepatic and renal veins. No pathologically enlarged lymph nodes in the abdomen or pelvis. Reproductive: Normal size prostate. Other: No pneumoperitoneum. No focal fluid collection. Small volume pelvic ascites. Musculoskeletal: No aggressive appearing focal osseous lesions. Mild lumbar spondylosis. IMPRESSION: 1. Extensive irregular masslike consolidation replacing much of the right lower lobe, somewhat sharply marginated, mildly increased since 04/29/2019 chest CT, indeterminate for evolving radiation fibrosis versus viable tumor. New cavitary focus within the medial right lower lobe with air-fluid level. PET-CT could be considered for further evaluation versus close chest CT follow-up in 3 months. 2. Scattered ill-defined subcentimeter pulmonary nodules are not definitely changed. Previously visualized left lung opacities have largely resolved. 3. No pathologically enlarged thoracic nodes. No evidence of metastatic disease in the abdomen or pelvis. 4. Small loculated right pleural effusion. Interval removal of right chest tube. 5. Small volume pelvic ascites. 6. Nonspecific heterogeneous enhancement of the kidneys bilaterally. No hydronephrosis. Findings could represent medical renal disease or pyelonephritis. No evidence of renal abscess. 7. Aortic Atherosclerosis (ICD10-I70.0) and Emphysema (ICD10-J43.9). These results will be called to the ordering clinician or  representative by the Radiologist Assistant, and communication documented in the PACS or zVision Dashboard. Electronically Signed   By: Ilona Sorrel M.D.   On: 07/23/2019 11:09   DG Chest Port 1 View  Result Date: 08/03/2019 CLINICAL DATA:  Shortness of breath.  Hypoxia. EXAM: PORTABLE CHEST 1 VIEW COMPARISON:  07/31/2019.  CT 07/23/2019. FINDINGS: Mediastinum hilar structures normal. Persistent atelectasis and infiltrate  right lung base. Persistent cavitary changes right lung base. Persistent right-sided pleural effusion. Similar findings noted on prior exam. Symmetric single nodular opacities noted over the chest bilaterally consistent nipple shadows. Stable cardiomegaly. No acute bony abnormality. IMPRESSION: Persistent right base atelectasis and infiltrate. Persistent cavitary changes right lung base. Persistent right-sided pleural effusion. No significant interim change from prior exam. Electronically Signed   By: [place]   On: 08/03/2019 11:22   DG Chest Portable 1 View  Result Date: 07/31/2019 CLINICAL DATA:  Shortness of breath EXAM: PORTABLE CHEST 1 VIEW COMPARISON:  April 28, 2019 FINDINGS: There is loculated effusion at the right base with atelectatic change in the right base. There is consolidation in the medial right base. Lungs elsewhere clear. Heart is upper normal in size with pulmonary vascularity normal. No adenopathy. No bone lesions. IMPRESSION: Loculated pleural effusion right base with consolidation medial right base and atelectatic change elsewhere in the right base. Left lung clear. Heart upper normal in size. Electronically Signed   By: Lowella Grip III M.D.   On: 07/31/2019 18:23    ASSESSMENT AND PLAN: 1. Stage IV(T1b, N2, M1a) non-small cell lung cancer, adenocarcinoma  2. Pancytopenia secondary to recent chemotherapy 3. Febrile neutropenia 4. Mucositis/? Esophageal candidiasis 5. Hypokalemia 6. Atrial fibrillation 7. History of PE  -Transfuse 1 unit  of platelets today due to bleeding and platelet count <5. Will check 1 hour post-transfusion CBC. If platelet count <10K, recommend additional unit of platelets. Check CBC daily. Transfuse platelets for platelet count <10K or active bleeding.  -Continue Granix. -Hemoglobin is 9.9 today which is stable. Monitor and transfuse PRBC is hemoglobin <8. -Continue IV antibiotics per hospitalist. -Continue fluconazole, MMW, and viscous lidocaine. -Replete K+ per hosptialist -Continue to hold Xarelto for now due to thrombocytopenia and bleeding lips. Can resume Xarelto once platelet count >50K.   LOS: 3 days   Mikey Bussing, DNP, AGPCNP-BC, AOCNP 08/06/19  ADDENDUM: Hematology/Oncology Attending: I had a face-to-face encounter with the patient today.  I recommended his care plan.  I agree with the above note.  This is a very pleasant 59 years old African-American male with a stage IV non-small cell lung cancer, adenocarcinoma and currently undergoing systemic chemotherapy with carboplatin, Alimta and Keytruda status post 4 cycles.  The patient has a rough time with this treatment and he was recently admitted to the hospital with severe pancytopenia secondary to his recent systemic chemotherapy. I had a lengthy discussion with the patient today about his current condition and treatment options.  I strongly recommend for the patient to consider palliative care and hospice but he declined this option and would like to continue fighting his disease and to survive longer. I will continue his current supportive care for the febrile neutropenia and thrombocytopenia. I will arrange for the patient a follow-up appointment after discharge for more detailed discussion of his condition and treatment options including palliative care and hospice but if he declines and may consider him for single agent treatment with immunotherapy with Keytruda which may be more tolerable than the current combination. For the  hypokalemia, the patient will receive potassium supplements. For the history of pulmonary embolism he is currently on treatment with Xarelto but we will continue to hold his treatment with Xarelto for now until his platelet count is over 50,000. Thank you so much for taking good care of Mr. Quest, I will continue to follow up the patient with you and assist in his management on as-needed basis.  Disclaimer:  This note was dictated with voice recognition software. Similar sounding words can inadvertently be transcribed and may be missed upon review. Eilleen Kempf, MD

## 2019-08-06 NOTE — Progress Notes (Signed)
PROGRESS NOTE  Tyler [name]  DOB: 1961/04/28  PCP: Brand Males, MD VOH:[id number]  DOA: 08/03/2019 Admitted From: Home  LOS: 3 days   Chief Complaint  Patient presents with  . Shortness of Breath   Brief narrative: Tyler [name] a 59 y.o.malewith medical history significant ofpermanent atrial fibrillation, stage IV non-small cell lung cancer/adenocarcinoma on current chemotherapy, history of pulmonary embolism, EtOH abuse, tobacco abuse. On 1/22, patient was sent to the ED from oncology clinic where he presented with cough, fever, progressive shortness of breath, progressively worsening for 2 to 3 days with associated dizziness,fatigue, weakness,especially when standing.   In the ED, patient was hypoxic and required supplemental oxygen at 2 L/min. Work-up showed WBC count 0.4, hemoglobin 7.7, platelet 21.   Rapid Covid-19 negative.   Chest x-ray with persistent right base atelectasis versus infiltrate with right pleural effusion.   Patient was started on vancomycin, cefepime, Flagyl for neutropenic fever.   Admitted to hospitalist service for further evaluation and management  Subjective: Patient was seen and examined this morning.  Middle-aged African-American male.  Looks older for his age.  Propped up in bed.  On supplemental oxygen.  He had blood clots on his lips.  I did not notice any oral thrush on oral examination.  Assessment/Plan:  Acute respiratory failure with hypoxia Right-sided pneumonia Neutropenic fever -Presented with fever, progressively worsening shortness of breath.  Right base infiltrate noted in chest x-ray. -Started on broad-spectrum antibiotic coverage with vancomycin, cefepime and Flagyl.  Continue the same. -Pending culture reports. -Remains on oxygen supplementation.  Gradually wean down.  Stage IV adenocarcinoma right lung -Patient is followed up by medical oncology Dr. [name]. He is still on chemotherapy.  -Oncology consult  appreciated.  Symptomatic anemia. Pancytopenia -This is likely from active chemotherapy.  -Pancytopenia worsens.  Absolute neutrophil count 0, platelet count less than 3 today -1/22, 2 units of PRBCs transfused -1/25, noted to have bleeding from the lips.  Platelet transfusion ordered. -Continue to monitor and transfuse as needed. -Granix per oncology.  Concern for esophageal candidiasis -Patient has been have a lot of pain and difficulty swallowing doing due to this he thinks that the Magic mouthwash is making it worse. We will continue fluconazole and Magic mouthwash also will add viscous lidocaine and pain management if needed Patient continued to have pain orally still his morphine solution was not helping he wanted to change it to oxycodone which he said he was on at home. This was changed  Permanent atrial fibrillation -Continue metoprolol 25 mg twice daily -Continue to hold Xarelto 20 mg daily due to severely low platelet count  Hxpulmonary embolism Continue to hold Xarelto 20 mg daily due to thrombocytopenia  Prognosis -overall prognosis poor.  Oncology to discuss goals of care.  Palliative care consult ordered as well.  Malnutrition - Body mass index is 14.89 kg/m.   Mobility: PT eval Diet: Currently on dysphagia 3 diet Fluid: None DVT prophylaxis:  SCDs Code Status:  Full code Family Communication:  None at bedside Expected Discharge:  Continue inpatient management.  Consultants:  Oncology, palliative care  Procedures:  None  Antimicrobials: Anti-infectives (From admission, onward)   Start     Dose/Rate Route Frequency Ordered Stop   08/05/19 1600  vancomycin (VANCOCIN) IVPB 1000 mg/200 mL premix     1,000 mg 200 mL/hr over 60 Minutes Intravenous Every 24 hours 08/05/19 1419     08/05/19 1400  fluconazole (DIFLUCAN) IVPB 200 mg     200 mg  100 mL/hr over 60 Minutes Intravenous Every 24 hours 08/05/19 1241     08/04/19 1800  fluconazole (DIFLUCAN)  IVPB 100 mg  Status:  Discontinued     100 mg 50 mL/hr over 60 Minutes Intravenous Every 24 hours 08/03/19 1552 08/05/19 1241   08/04/19 1600  fluconazole (DIFLUCAN) IVPB 100 mg  Status:  Discontinued     100 mg 50 mL/hr over 60 Minutes Intravenous Every 24 hours 08/03/19 1510 08/03/19 1552   08/04/19 1600  vancomycin (VANCOREADY) IVPB 750 mg/150 mL  Status:  Discontinued     750 mg 150 mL/hr over 60 Minutes Intravenous Every 24 hours 08/03/19 1552 08/05/19 1419   08/04/19 0400  ceFEPIme (MAXIPIME) 2 g in sodium chloride 0.9 % 100 mL IVPB     2 g 200 mL/hr over 30 Minutes Intravenous Every 12 hours 08/03/19 1552     08/03/19 1800  fluconazole (DIFLUCAN) IVPB 200 mg     200 mg 100 mL/hr over 60 Minutes Intravenous  Once 08/03/19 1552 08/03/19 1720   08/03/19 1600  fluconazole (DIFLUCAN) IVPB 200 mg  Status:  Discontinued     200 mg 100 mL/hr over 60 Minutes Intravenous  Once 08/03/19 1510 08/03/19 1552   08/03/19 1400  vancomycin (VANCOCIN) IVPB 1000 mg/200 mL premix     1,000 mg 200 mL/hr over 60 Minutes Intravenous  Once 08/03/19 1347 08/03/19 1700   08/03/19 1400  ceFEPIme (MAXIPIME) 1 g in sodium chloride 0.9 % 100 mL IVPB  Status:  Discontinued     1 g 200 mL/hr over 30 Minutes Intravenous  Once 08/03/19 1347 08/03/19 1348   08/03/19 1400  metroNIDAZOLE (FLAGYL) IVPB 500 mg  Status:  Discontinued     500 mg 100 mL/hr over 60 Minutes Intravenous  Once 08/03/19 1347 08/03/19 1523   08/03/19 1400  ceFEPIme (MAXIPIME) 2 g in sodium chloride 0.9 % 100 mL IVPB     2 g 200 mL/hr over 30 Minutes Intravenous  Once 08/03/19 1348 08/03/19 1747        Code Status: Full Code   Diet Order            DIET DYS 3 Room service appropriate? Yes with Assist; Fluid consistency: Thin  Diet effective now              Infusions:  . ceFEPime (MAXIPIME) IV 2 g (08/06/19 0425)  . fluconazole (DIFLUCAN) IV 200 mg (08/06/19 1359)  . vancomycin 1,000 mg (08/05/19 1645)    Scheduled Meds: .  sodium chloride   Intravenous Once  . feeding supplement (ENSURE ENLIVE)  237 mL Oral TID BM  . folic acid  1 mg Oral Daily  . lidocaine  15 mL Mouth/Throat TID AC  . metoprolol tartrate  25 mg Oral BID  . multivitamin with minerals  1 tablet Oral Daily  . Tbo-filgastrim (GRANIX) SQ  300 mcg Subcutaneous q1800    PRN meds: acetaminophen **OR** acetaminophen, bisacodyl, guaiFENesin-dextromethorphan, heparin lock flush, lidocaine, lip balm, magic mouthwash w/lidocaine, metoprolol tartrate, ondansetron **OR** ondansetron (ZOFRAN) IV, oxyCODONE-acetaminophen, phenol, senna-docusate, sodium chloride flush, sodium chloride flush, sodium chloride flush, sodium chloride flush   Objective: Vitals:   08/06/19 1121 08/06/19 1357  BP: 104/77 101/80  Pulse: 86 91  Resp: 18 18  Temp: 98.5 F (36.9 C) (!) 97.5 F (36.4 C)  SpO2: 93% 98%    Intake/Output Summary (Last 24 hours) at 08/06/2019 1407 Last data filed at 08/06/2019 1405 Gross per 24 hour  Intake  1105.54 ml  Output 1350 ml  Net -244.46 ml   Filed Weights   08/03/19 1135 08/05/19 1336  Weight: 51.3 kg 52.6 kg   Weight change:  Body mass index is 14.89 kg/m.   Physical Exam: General exam: Sick looking middle-aged African-American male.  Looks older for his age Skin: No rashes, lesions or ulcers. HEENT: Atraumatic, normocephalic, supple neck, no obvious bleeding Oral exam: Blood clots in the lips.  I did not notice any visible thrush on oral and pharyngeal exam Lungs: Clear to auscultation bilaterally CVS: Regular rate and rhythm, no murmur GI/Abd soft, nontender, nondistended, bowel sound present CNS: Alert, awake, slow to respond, oriented to place and person Psychiatry: Depressed look Extremities: No pedal edema, no calf tenderness  Data Review: I have personally reviewed the laboratory data and studies available.  Recent Labs  Lab 07/31/19 1125 07/31/19 1125 07/31/19 1433 08/03/19 1204 08/04/19 0247 08/05/19 0251  08/06/19 0739  WBC 1.8*  --  1.8* 0.4* 0.3* 0.2* 0.5*  NEUTROABS 1.2*  --  1.3*  --  0.0* 0.0*  --   HGB 8.2*  --  9.3* 7.7* 9.7* 10.5* 9.9*  HCT 25.2*   < > 29.1* 24.4* 28.8* 32.2* 30.7*  MCV 80.8   < > 82.7 84.1 80.2 84.3 85.3  PLT 88*  --  95* 21* 8* 7* <5*   < > = values in this interval not displayed.   Recent Labs  Lab 07/31/19 1433 08/03/19 1204 08/04/19 0247 08/05/19 0251 08/06/19 0739  NA 137 134* 131* 137 136  K 2.7* 3.5 3.0* 2.6* 3.1*  CL 88* 88* 92* 96* 92*  CO2 36* 31 27 30  32  GLUCOSE 88 139* 82 95 88  BUN 16 17 18 20 19   CREATININE 1.13 1.29* 1.17 1.06 1.14  CALCIUM 8.3* 7.8* 7.0* 7.7* 8.1*  MG  --   --  0.9* 2.0  --     Terrilee Croak, MD  [facility] 08/06/2019

## 2019-08-07 ENCOUNTER — Inpatient Hospital Stay: Payer: Medicaid Other

## 2019-08-07 DIAGNOSIS — I4819 Other persistent atrial fibrillation: Secondary | ICD-10-CM

## 2019-08-07 LAB — CBC WITH DIFFERENTIAL/PLATELET
Abs Immature Granulocytes: 0 10*3/uL (ref 0.00–0.07)
Band Neutrophils: 10 %
Basophils Absolute: 0 10*3/uL (ref 0.0–0.1)
Basophils Relative: 0 %
Eosinophils Absolute: 0.1 10*3/uL (ref 0.0–0.5)
Eosinophils Relative: 6 %
HCT: 26.2 % — ABNORMAL LOW (ref 39.0–52.0)
Hemoglobin: 8.5 g/dL — ABNORMAL LOW (ref 13.0–17.0)
Lymphocytes Relative: 21 %
Lymphs Abs: 0.2 10*3/uL — ABNORMAL LOW (ref 0.7–4.0)
MCH: 27.5 pg (ref 26.0–34.0)
MCHC: 32.4 g/dL (ref 30.0–36.0)
MCV: 84.8 fL (ref 80.0–100.0)
Monocytes Absolute: 0.2 10*3/uL (ref 0.1–1.0)
Monocytes Relative: 27 %
Neutro Abs: 0.4 10*3/uL — ABNORMAL LOW (ref 1.7–7.7)
Neutrophils Relative %: 36 %
Platelets: 32 10*3/uL — ABNORMAL LOW (ref 150–400)
RBC: 3.09 MIL/uL — ABNORMAL LOW (ref 4.22–5.81)
RDW: 16.5 % — ABNORMAL HIGH (ref 11.5–15.5)
WBC: 0.9 10*3/uL — CL (ref 4.0–10.5)
nRBC: 0 % (ref 0.0–0.2)

## 2019-08-07 LAB — PREPARE PLATELET PHERESIS: Unit division: 0

## 2019-08-07 LAB — BPAM PLATELET PHERESIS
Blood Product Expiration Date: 202101252359
ISSUE DATE / TIME: 202101251102
Unit Type and Rh: 6200

## 2019-08-07 MED ORDER — OXYCODONE-ACETAMINOPHEN 5-325 MG PO TABS
1.0000 | ORAL_TABLET | ORAL | Status: DC | PRN
  Administered 2019-08-07 – 2019-08-10 (×13): 1 via ORAL
  Filled 2019-08-07 (×13): qty 1

## 2019-08-07 MED ORDER — POTASSIUM CHLORIDE CRYS ER 20 MEQ PO TBCR
20.0000 meq | EXTENDED_RELEASE_TABLET | Freq: Two times a day (BID) | ORAL | Status: DC
  Administered 2019-08-07 – 2019-08-10 (×6): 20 meq via ORAL
  Filled 2019-08-07 (×7): qty 1

## 2019-08-07 NOTE — Progress Notes (Signed)
Brief oncology note:  I stop by to visit [name] today.  He no longer has any bleeding from his lips and denies any other bleeding.  He was able to eat better last night after receiving viscous lidocaine.  He was eating breakfast at the time my visit.  Noted to have an intermittent cough but offers no other complaints this morning.  CBC    Component Value Date/Time   WBC 0.9 (LL) 08/07/2019 0412   RBC 3.09 (L) 08/07/2019 0412   HGB 8.5 (L) 08/07/2019 0412   HGB 8.2 (L) 07/31/2019 1125   HCT 26.2 (L) 08/07/2019 0412   HCT 26.0 (L) 04/10/2019 0450   PLT 32 (L) 08/07/2019 0412   PLT 88 (L) 07/31/2019 1125   MCV 84.8 08/07/2019 0412   MCH 27.5 08/07/2019 0412   MCHC 32.4 08/07/2019 0412   RDW 16.5 (H) 08/07/2019 0412   LYMPHSABS 0.2 (L) 08/07/2019 0412   MONOABS 0.2 08/07/2019 0412   EOSABS 0.1 08/07/2019 0412   BASOSABS 0.0 08/07/2019 0412   CBC results have been reviewed.  White blood cell count is slowly trending upward.  Recommend continuation of Granix.  His hemoglobin is stable at 8.5.  No PRBC transfusion is recommended.  Platelets have improved to 32,000 this morning and he does not have any active bleeding.  We will hold off on transfusing platelets today.  Recommend daily CBC.  Mikey Bussing, DNP, AGPCNP-BC, AOCNP

## 2019-08-07 NOTE — Evaluation (Signed)
Physical Therapy Evaluation Patient Details Name: Tyler [name] MRN: [id number] DOB: Sep 08, 1960 Today's Date: 08/07/2019   History of Present Illness  59 y.o. male with medical history significant of permanent atrial fibrillation, stage IV non-small cell lung cancer/adenocarcinoma on current chemotherapy, history of pulmonary embolism, EtOH abuse, tobacco abuse.  Pt admitted for Acute respiratory failure with hypoxia, right sided pneumonia and neutropenic fever.  Clinical Impression  Pt admitted with above diagnosis.  Pt currently with functional limitations due to the deficits listed below (see PT Problem List). Pt will benefit from skilled PT to increase their independence and safety with mobility to allow discharge to the venue listed below.  Pt very unsteady with ambulating and requiring supplemental oxygen for activity at this time.  Pt reports he lives with his wife however she cannot assist.  Pt would benefit from initial assist upon d/c for safety.  SATURATION QUALIFICATIONS: (This note is used to comply with regulatory documentation for home oxygen)  Patient Saturations on Room Air at Rest = 91%  Patient Saturations on Room Air while Ambulating = 86%  Patient Saturations on 2 Liters of oxygen while transferring = 90%  Please briefly explain why patient needs home oxygen: to improve oxygen saturations with physical activities such as ambulation.     Follow Up Recommendations Home health PT;Supervision/Assistance - 24 hour    Equipment Recommendations  None recommended by PT    Recommendations for Other Services       Precautions / Restrictions Precautions Precautions: Fall Precaution Comments: monitor HR and sats      Mobility  Bed Mobility Overal bed mobility: Modified Independent                Transfers Overall transfer level: Needs assistance Equipment used: Straight cane Transfers: Sit to/from Stand Sit to Stand: Min guard         General  transfer comment: min/guard for safety, pt self steadying upon rise with UEs  Ambulation/Gait Ambulation/Gait assistance: Min guard;Min assist Gait Distance (Feet): 9 Feet Assistive device: Straight cane Gait Pattern/deviations: Step-through pattern;Decreased stride length;Narrow base of support     General Gait Details: pt declined using RW despite need for bil UE support, very unsteady, instead used SPC and bed rails to self support however requiring slight assist for LOB x2, Spo2 dropped to 86% on room air with ambulation so 2L O2 [place] reapplied, HR also elevated 145 bpm, so only ambulated around bed and then assisted back into bed  Stairs            Wheelchair Mobility    Modified Rankin (Stroke Patients Only)       Balance Overall balance assessment: Needs assistance         Standing balance support: Bilateral upper extremity supported Standing balance-Leahy Scale: Poor                               Pertinent Vitals/Pain Pain Assessment: Faces Faces Pain Scale: Hurts a little bit Pain Intervention(s): Repositioned;Monitored during session    Home Living Family/patient expects to be discharged to:: Private residence Living Arrangements: Spouse/significant other;Children   Type of Home: Apartment Home Access: Stairs to enter   [employer] of Steps: 1 Home Layout: One level Home Equipment: Cane - single point;Walker - 2 wheels      Prior Function Level of Independence: Independent with assistive device(s)         Comments: Pt reports using  cane for ambulation PTA, was having difficulty walking to and from bathroom due to dyspnea     Hand Dominance        Extremity/Trunk Assessment   Upper Extremity Assessment Upper Extremity Assessment: Generalized weakness    Lower Extremity Assessment Lower Extremity Assessment: Generalized weakness       Communication   Communication: No difficulties  Cognition Arousal/Alertness:  Awake/alert Behavior During Therapy: WFL for tasks assessed/performed Overall Cognitive Status: Within Functional Limits for tasks assessed                                        General Comments      Exercises     Assessment/Plan    PT Assessment Patient needs continued PT services  PT Problem List Decreased strength;Decreased mobility;Decreased activity tolerance;Decreased balance;Decreased knowledge of use of DME;Cardiopulmonary status limiting activity;Decreased safety awareness       PT Treatment Interventions DME instruction;Therapeutic exercise;Gait training;Balance training;Functional mobility training;Therapeutic activities;Patient/family education    PT Goals (Current goals can be found in the Care Plan section)  Acute Rehab PT Goals PT Goal Formulation: With patient Time For Goal Achievement: 08/21/19 Potential to Achieve Goals: Good    Frequency Min 3X/week   Barriers to discharge        Co-evaluation               AM-PAC PT "6 Clicks" Mobility  Outcome Measure Help needed turning from your back to your side while in a flat bed without using bedrails?: A Little Help needed moving from lying on your back to sitting on the side of a flat bed without using bedrails?: A Little Help needed moving to and from a bed to a chair (including a wheelchair)?: A Little Help needed standing up from a chair using your arms (e.g., wheelchair or bedside chair)?: A Little Help needed to walk in hospital room?: A Little Help needed climbing 3-5 steps with a railing? : A Lot 6 Click Score: 17    End of Session Equipment Utilized During Treatment: Oxygen Activity Tolerance: Patient limited by fatigue Patient left: in bed;with call bell/phone within reach;with bed alarm set Nurse Communication: Mobility status PT Visit Diagnosis: Other abnormalities of gait and mobility (R26.89)    Time: 6153-7943 PT Time Calculation (min) (ACUTE ONLY): 19  min   Charges:   PT Evaluation $PT Eval Low Complexity: 1 Low        Kati PT, DPT Acute Rehabilitation Services Office: [phone]  Trena Platt 08/07/2019, 3:26 PM

## 2019-08-07 NOTE — Progress Notes (Signed)
Patient c/o pain from mouth sore. Does not want Lidocaine right now. Percocet is not due yet. PCP was notified.

## 2019-08-07 NOTE — Progress Notes (Signed)
PROGRESS NOTE  LYNDA [name]  DOB: 1961/01/14  PCP: Brand Males, MD GLO:[id number]  DOA: 08/03/2019 Admitted From: Home  LOS: 4 days   Chief Complaint  Patient presents with  . Shortness of Breath   Brief narrative: Tyler [name] a 59 y.o.malewith medical history significant ofpermanent atrial fibrillation, stage IV non-small cell lung cancer/adenocarcinoma on current chemotherapy, history of pulmonary embolism, EtOH abuse, tobacco abuse. On 1/22, patient was sent to the ED from oncology clinic where he presented with cough, fever, progressive shortness of breath, progressively worsening for 2 to 3 days with associated dizziness,fatigue, weakness,especially when standing.   In the ED, patient was hypoxic and required supplemental oxygen at 2 L/min. Work-up showed WBC count 0.4, hemoglobin 7.7, platelet 21.   Rapid Covid-19 negative.   Chest x-ray with persistent right base atelectasis versus infiltrate with right pleural effusion.   Patient was started on vancomycin, cefepime, Flagyl for neutropenic fever.   Admitted to hospitalist service for further evaluation and management  Subjective: Patient was seen and examined this morning.  Middle-aged African-American male.   Feels less weak today.  No bleeding noted in the lips today.  Able to eat as planned after viscous lidocaine.    Assessment/Plan: Acute respiratory failure with hypoxia Right-sided pneumonia Neutropenic fever -Presented with fever, progressively worsening shortness of breath. Right base infiltrate was noted in chest x-ray. -Started on broad-spectrum antibiotic coverage with vancomycin, cefepime and Flagyl.  -Culture reports negative so far. -Continue broad-spectrum antibiotics at this time because of significant neutropenia. -Remains on oxygen supplementation. Gradually wean down.  Stage IV adenocarcinoma right lung -Patient is followed up by medical oncology Dr. [name]. He is still on  chemotherapy.  -Oncology consult appreciated.  Patient apparently wants to continue aggressive care and is not interested in palliative/hospice.  Symptomatic anemia. Pancytopenia -This is likely from active chemotherapy.  -Pancytopenia worsens.  Absolute neutrophil count 0, platelet count 32 today -1/22, 2 units of PRBCs transfused.  Hemoglobin 9 today. -1/25, noted to have bleeding from the lips.  1 unit of platelet transfusion given -Continue to monitor and transfuse as needed. -Granix per oncology.  Odynophagia -Mucositis versus esophageal candidiasis  -Continue fluconazole, Magic mouthwash and viscous lidocaine.    Permanent atrial fibrillation -Continue metoprolol 25 mg twice daily -Continue to hold Xarelto 20 mg daily due to severely low platelet count  Hxpulmonary embolism Continue to hold Xarelto 20 mg daily due to thrombocytopenia  Prognosis -overall prognosis poor.  Oncology to discuss goals of care.  Palliative care consult ordered as well.  Malnutrition - Body mass index is 14.89 kg/m.   Mobility: PT eval Diet: Currently remains on dysphagia 3 diet Fluid: None DVT prophylaxis:  SCDs Code Status:  Full code Family Communication:  None at bedside Expected Discharge:  Continue inpatient management.  Consultants:  Oncology, palliative care  Procedures:  None  Antimicrobials: Anti-infectives (From admission, onward)   Start     Dose/Rate Route Frequency Ordered Stop   08/05/19 1600  vancomycin (VANCOCIN) IVPB 1000 mg/200 mL premix     1,000 mg 200 mL/hr over 60 Minutes Intravenous Every 24 hours 08/05/19 1419     08/05/19 1400  fluconazole (DIFLUCAN) IVPB 200 mg     200 mg 100 mL/hr over 60 Minutes Intravenous Every 24 hours 08/05/19 1241     08/04/19 1800  fluconazole (DIFLUCAN) IVPB 100 mg  Status:  Discontinued     100 mg 50 mL/hr over 60 Minutes Intravenous Every 24 hours 08/03/19 1552  08/05/19 1241   08/04/19 1600  fluconazole (DIFLUCAN)  IVPB 100 mg  Status:  Discontinued     100 mg 50 mL/hr over 60 Minutes Intravenous Every 24 hours 08/03/19 1510 08/03/19 1552   08/04/19 1600  vancomycin (VANCOREADY) IVPB 750 mg/150 mL  Status:  Discontinued     750 mg 150 mL/hr over 60 Minutes Intravenous Every 24 hours 08/03/19 1552 08/05/19 1419   08/04/19 0400  ceFEPIme (MAXIPIME) 2 g in sodium chloride 0.9 % 100 mL IVPB     2 g 200 mL/hr over 30 Minutes Intravenous Every 12 hours 08/03/19 1552     08/03/19 1800  fluconazole (DIFLUCAN) IVPB 200 mg     200 mg 100 mL/hr over 60 Minutes Intravenous  Once 08/03/19 1552 08/03/19 1720   08/03/19 1600  fluconazole (DIFLUCAN) IVPB 200 mg  Status:  Discontinued     200 mg 100 mL/hr over 60 Minutes Intravenous  Once 08/03/19 1510 08/03/19 1552   08/03/19 1400  vancomycin (VANCOCIN) IVPB 1000 mg/200 mL premix     1,000 mg 200 mL/hr over 60 Minutes Intravenous  Once 08/03/19 1347 08/03/19 1700   08/03/19 1400  ceFEPIme (MAXIPIME) 1 g in sodium chloride 0.9 % 100 mL IVPB  Status:  Discontinued     1 g 200 mL/hr over 30 Minutes Intravenous  Once 08/03/19 1347 08/03/19 1348   08/03/19 1400  metroNIDAZOLE (FLAGYL) IVPB 500 mg  Status:  Discontinued     500 mg 100 mL/hr over 60 Minutes Intravenous  Once 08/03/19 1347 08/03/19 1523   08/03/19 1400  ceFEPIme (MAXIPIME) 2 g in sodium chloride 0.9 % 100 mL IVPB     2 g 200 mL/hr over 30 Minutes Intravenous  Once 08/03/19 1348 08/03/19 1747        Code Status: Full Code   Diet Order            DIET DYS 3 Room service appropriate? Yes with Assist; Fluid consistency: Thin  Diet effective now              Infusions:  . ceFEPime (MAXIPIME) IV 2 g (08/07/19 0509)  . fluconazole (DIFLUCAN) IV 200 mg (08/06/19 1359)  . vancomycin 1,000 mg (08/06/19 1555)    Scheduled Meds: . feeding supplement (ENSURE ENLIVE)  237 mL Oral TID BM  . folic acid  1 mg Oral Daily  . lidocaine  15 mL Mouth/Throat TID AC  . metoprolol tartrate  25 mg Oral BID    . multivitamin with minerals  1 tablet Oral Daily  . potassium chloride SA  20 mEq Oral BID  . Tbo-filgastrim (GRANIX) SQ  300 mcg Subcutaneous q1800    PRN meds: acetaminophen **OR** acetaminophen, bisacodyl, guaiFENesin-dextromethorphan, heparin lock flush, lidocaine, lip balm, magic mouthwash w/lidocaine, metoprolol tartrate, ondansetron **OR** ondansetron (ZOFRAN) IV, oxyCODONE-acetaminophen, phenol, senna-docusate, sodium chloride flush, sodium chloride flush, sodium chloride flush, sodium chloride flush   Objective: Vitals:   08/07/19 0012 08/07/19 0451  BP: (!) 98/59 90/62  Pulse: 95 87  Resp:  16  Temp: 99 F (37.2 C) 98.2 F (36.8 C)  SpO2: 96% 100%    Intake/Output Summary (Last 24 hours) at 08/07/2019 1429 Last data filed at 08/07/2019 1300 Gross per 24 hour  Intake 603.82 ml  Output 1175 ml  Net -571.18 ml   Filed Weights   08/03/19 1135 08/05/19 1336  Weight: 51.3 kg 52.6 kg   Weight change:  Body mass index is 14.89 kg/m.   Physical Exam:  General exam: Middle-aged African-American male.  Looks older for his age.   Skin: No rashes, lesions or ulcers. HEENT: Atraumatic, normocephalic, supple neck, no obvious bleeding Oral exam: No blood clots noted on the lips today.  No oral ulcers noted.   Lungs: Clear to auscultation bilaterally CVS: Regular rate and rhythm, no murmur GI/Abd soft, nontender, nondistended, bowel sound present CNS: Alert, awake, slow to respond, oriented to place and person Psychiatry: Depressed look Extremities: No pedal edema, no calf tenderness  Data Review: I have personally reviewed the laboratory data and studies available.  Recent Labs  Lab 07/31/19 1433 08/03/19 1204 08/04/19 0247 08/05/19 0251 08/06/19 0739 08/06/19 1417 08/07/19 0412  WBC 1.8*   < > 0.3* 0.2* 0.5* 0.5* 0.9*  NEUTROABS 1.3*  --  0.0* 0.0*  --  0.1* 0.4*  HGB 9.3*   < > 9.7* 10.5* 9.9* 9.0* 8.5*  HCT 29.1*   < > 28.8* 32.2* 30.7* 28.1* 26.2*  MCV 82.7    < > 80.2 84.3 85.3 85.2 84.8  PLT 95*   < > 8* 7* <5* 44* 32*   < > = values in this interval not displayed.   Recent Labs  Lab 07/31/19 1433 08/03/19 1204 08/04/19 0247 08/05/19 0251 08/06/19 0739  NA 137 134* 131* 137 136  K 2.7* 3.5 3.0* 2.6* 3.1*  CL 88* 88* 92* 96* 92*  CO2 36* 31 27 30  32  GLUCOSE 88 139* 82 95 88  BUN 16 17 18 20 19   CREATININE 1.13 1.29* 1.17 1.06 1.14  CALCIUM 8.3* 7.8* 7.0* 7.7* 8.1*  MG  --   --  0.9* 2.0  --     Terrilee Croak, MD  [facility] 08/07/2019

## 2019-08-07 NOTE — Consult Note (Addendum)
Consultation Note Date: 08/07/2019   Patient Name: Tyler [name]  DOB: May 08, 1961  MRN: [id number]  Age / Sex: 59 y.o., male   PCP: Brand Males, MD Referring Physician: Terrilee Croak, MD   REASON FOR CONSULTATION:Establishing goals of care  Palliative Care consult requested for goals of care discussion in this 59 y.o. male with multiple medical problems including atrial fibrillation (Xarelto), stage IV non-small cell right lung adenocarcinoma (Carbo, Alimta, Keytruda last received 07/24/19),  pulmonary embolism, EtOH abuse, and tobacco abuse. Tyler [name] presented to ED with complaints of shortness of breath, weakness, cough, and fever. On admission he reported symptoms began 2-3 days prior. During his ED work-up hemoglobin 7.7, NA 134, WBC 0.4, platelets 21. Covid-19 negative. Chest x-ray with persistent right base atelectasis versus infiltrate with right pleural effusion. Patient was started on IV antibiotics for neutropenic fever. Since admission he continues to require supplemental oxygen (2L). He has received 2 units of PRBC. Patient complaining of pain when swallowing. He is receiving viscous lidocaine and Magic mouthwash.    Clinical Assessment and Goals of Care: I have reviewed medical records including lab results, imaging, Epic notes, and MAR, received report from the bedside RN, and assessed the patient. I met at the bedside with patient  to discuss diagnosis prognosis, GOC, EOL wishes, disposition and options. No family at the bedside. I offered to involve family in goals of care discussion however, Tyler [name] denied expressing he could inform family with information as needed.   I introduced Palliative Medicine as specialized medical care for people living with serious illness. It focuses on providing relief from the symptoms and stress of a serious illness. The goal is to improve quality of life for both the patient and the family. Patient verbalized understanding.   We  discussed a brief life review of the patient, along with his functional and nutritional status. Tyler [name] reports he lives in the home with his wife of 13 years and their daughter.  Prior to admission he required assistance with ADLs due to fatigue and generalized weakness, which he reports being worst when he received his chemotherapy treatments. He states he uses a cane when ambulating for stability. Denies home oxygen use.   Reports his appetite has declined over the past several weeks, complaining of neck and throat pain. He reports trying to drink Ensure for nutrition. He reports his family assist with all care and medical needs. He states he uses an [facility] to get to his medical appointments.   We discussed His current illness and what it means in the larger context of His on-going co-morbidities. With specific discussions regarding his lung cancer, and overall functional and nutritional decline. Natural disease trajectory and expectations at EOL were discussed.  Tyler [name] reports he has a clear understanding of his current illness and co-morbidities. He expresses he remains hopeful for some stability, with awareness he would not be cured from his cancer. He began to speak on his disappointment of being denied for disability despite his health challenges. Therapeutic listening and support provided. He reports "I am focusing on getting this approved because I can't work or do anything for myself. I am not giving up but I deserve to receive money since I have worked during my life!"   I attempted to elicit values and goals of care important to the patient.    The difference between aggressive medical intervention and comfort care was considered in light of the patient's goals of care. Mr.  [name] shares previous experience with going to hospice and that possibly not being the right timing.   I created space and opportunity to further discuss his current condition and failure to thrive. He verbalizes  understanding and shares he is not interested in hospice currently. He states he wishes to continue moving forward with all available care and would not wish to talk about anything differently. Support given.   Advanced directives, concepts specific to code status, artifical feeding and hydration, and rehospitalization were considered and discussed. Patient states he does not have a documented advanced directive. He reports in the event he becomes unable to make decisions for himself his wife, Tyler [name] would be his designated Media planner. Patient reports he does not think he would want any forms of artificial feeding such as PEG.   I discussed in details with patient regarding his full code status with consideration to his current illness and co-morbidities. He verbalized understanding expressing he could not say he would not want heroic measures performed. He states plans to further discuss with his wife, but at this time he would wish to have all aggressive interventions including CPR and intubation despite awareness of poor prognosis. He states "that may be a decision my wife and daughter has to make when they have too!" I educated patient the best time to prepare for an emergency is before it happens, also expressing by him discussing with family and/or making decisions this eliminates any emotional distractions or concerns by family in the event they struggle with the best way to manage his care. He verbalized understanding and again requested to remain a full code.   Patient has made it clear he is not interested in hospice or their support at this time. I explained the differences between hospice and palliative. Patient declines both services at this time but is aware he may initiate at anytime by discussing with his outpatient medical team.   Questions and concerns were addressed. Patient was encouraged to call with questions or concerns.  PMT will continue to support  holistically.   SOCIAL HISTORY:     reports that he has been smoking cigarettes. He has a 10.00 pack-year smoking history. He has never used smokeless tobacco. He reports previous alcohol use. He reports previous drug use. Drug: Marijuana.  CODE STATUS: Full code  ADVANCE DIRECTIVES: Wife: Cherene Altes (wife)   SYMPTOM MANAGEMENT: per attending   Palliative Prophylaxis:   Aspiration, Bowel Regimen, Eye Care, Frequent Pain Assessment and Oral Care  PSYCHO-SOCIAL/SPIRITUAL:  Support System: Family   Desire for further Chaplaincy support: no   Additional Recommendations (Limitations, Scope, Preferences):  Full Scope Treatment   PAST MEDICAL HISTORY: Past Medical History:  Diagnosis Date  . Alcohol abuse   . Chronic back pain   . COPD (chronic obstructive pulmonary disease) ([place])   . Dyspnea   . Lung mass    mediastinal adenopathy  . Lung nodule   . Multiple pulmonary nodules 04/02/2018  . Pneumonia   . Right lower lobe pulmonary nodule 04/02/2018   12/01/2017: PET SUV 2.1  . Tobacco abuse     PAST SURGICAL HISTORY:  Past Surgical History:  Procedure Laterality Date  . APPENDECTOMY    . VIDEO BRONCHOSCOPY WITH ENDOBRONCHIAL NAVIGATION N/A 04/05/2018   Procedure: VIDEO BRONCHOSCOPY WITH ENDOBRONCHIAL NAVIGATION;  Surgeon: Garner Nash, DO;  Location: [place];  Service: Thoracic;  Laterality: N/A;  . VIDEO BRONCHOSCOPY WITH ENDOBRONCHIAL NAVIGATION N/A 01/11/2019   Procedure: VIDEO BRONCHOSCOPY WITH ENDOBRONCHIAL NAVIGATION;  Surgeon: Garner Nash, DO;  Location: [place];  Service: Thoracic;  Laterality: N/A;  . VIDEO BRONCHOSCOPY WITH ENDOBRONCHIAL ULTRASOUND N/A 01/11/2019   Procedure: VIDEO BRONCHOSCOPY WITH ENDOBRONCHIAL ULTRASOUND;  Surgeon: Garner Nash, DO;  Location: [place];  Service: Thoracic;  Laterality: N/A;  . VIDEO BRONCHOSCOPY WITH RADIAL ENDOBRONCHIAL ULTRASOUND N/A 01/11/2019   Procedure: VIDEO BRONCHOSCOPY WITH RADIAL ENDOBRONCHIAL ULTRASOUND;   Surgeon: Garner Nash, DO;  Location: [place];  Service: Thoracic;  Laterality: N/A;  . WISDOM TOOTH EXTRACTION      ALLERGIES:  has No Known Allergies.   MEDICATIONS:  Current Facility-Administered Medications  Medication Dose Route Frequency Provider Last Rate Last Admin  . acetaminophen (TYLENOL) tablet 650 mg  650 mg Oral Q6H PRN [place] Eric J, DO   650 mg at 08/07/19 0148   Or  . acetaminophen (TYLENOL) suppository 650 mg  650 mg Rectal Q6H PRN [place] Donnamarie Poag, DO      . bisacodyl (DULCOLAX) suppository 10 mg  10 mg Rectal Daily PRN [place] Eric J, DO      . ceFEPIme (MAXIPIME) 2 g in sodium chloride 0.9 % 100 mL IVPB  2 g Intravenous Q12H Green, Terri L, RPH 200 mL/hr at 08/07/19 0509 2 g at 08/07/19 0509  . feeding supplement (ENSURE ENLIVE) (ENSURE ENLIVE) liquid 237 mL  237 mL Oral TID BM Cristal Deer, MD   237 mL at 08/06/19 2130  . fluconazole (DIFLUCAN) IVPB 200 mg  200 mg Intravenous Q24H Luiz Ochoa, RPH 100 mL/hr at 08/06/19 1359 200 mg at 08/06/19 1359  . folic acid (FOLVITE) tablet 1 mg  1 mg Oral Daily [place] Donnamarie Poag, DO   1 mg at 08/06/19 1517  . guaiFENesin-dextromethorphan (ROBITUSSIN DM) 100-10 MG/5ML syrup 5 mL  5 mL Oral Q4H PRN [place] Donnamarie Poag, DO      . heparin lock flush 100 unit/mL  500 Units Intracatheter Daily PRN Heilingoetter, Cassandra L, PA-C      . lidocaine (XYLOCAINE) 2 % viscous mouth solution 15 mL  15 mL Mouth/Throat TID AC Cristal Deer, MD   15 mL at 08/07/19 0841  . lidocaine (XYLOCAINE) 2 % viscous mouth solution 15 mL  15 mL Mouth/Throat Q4H PRN Cristal Deer, MD   15 mL at 08/06/19 0719  . lip balm (CARMEX) ointment 1 application  1 application Topical PRN Cristal Deer, MD      . magic mouthwash w/lidocaine  5 mL Oral QID PRN Arby Barrette A, NP   5 mL at 08/04/19 1621  . metoprolol tartrate (LOPRESSOR) injection 5 mg  5 mg Intravenous Q6H PRN [place] Eric J, DO   5 mg at 08/03/19 1717  . metoprolol tartrate  (LOPRESSOR) tablet 25 mg  25 mg Oral BID [place] Eric J, DO   25 mg at 08/06/19 2126  . multivitamin with minerals tablet 1 tablet  1 tablet Oral Daily Cristal Deer, MD   1 tablet at 08/06/19 0951  . ondansetron (ZOFRAN) tablet 4 mg  4 mg Oral Q6H PRN [place] Donnamarie Poag, DO       Or  . ondansetron [facility]) injection 4 mg  4 mg Intravenous Q6H PRN [place] Eric J, DO      . oxyCODONE-acetaminophen (PERCOCET/ROXICET) 5-325 MG per tablet 1 tablet  1 tablet Oral Q6H PRN Cristal Deer, MD   1 tablet at 08/07/19 0843  . phenol (CHLORASEPTIC) mouth spray 1 spray  1 spray Mouth/Throat PRN [place] Donnamarie Poag, DO      .  potassium chloride SA (KLOR-CON) CR tablet 20 mEq  20 mEq Oral BID Dahal, Marlowe Aschoff, MD      . senna-docusate (Senokot-S) tablet 1 tablet  1 tablet Oral QHS PRN [place] Eric J, DO      . sodium chloride flush (NS) 0.9 % injection 10 mL  10 mL Intracatheter PRN Heilingoetter, Cassandra L, PA-C      . sodium chloride flush (NS) 0.9 % injection 10 mL  10 mL Intracatheter PRN Heilingoetter, Cassandra L, PA-C      . sodium chloride flush (NS) 0.9 % injection 3 mL  3 mL Intracatheter PRN Heilingoetter, Cassandra L, PA-C      . sodium chloride flush (NS) 0.9 % injection 3 mL  3 mL Intracatheter PRN Heilingoetter, Cassandra L, PA-C      . Tbo-Filgrastim (GRANIX) injection 300 mcg  300 mcg Subcutaneous q1800 Cristal Deer, MD   300 mcg at 08/06/19 1627  . vancomycin (VANCOCIN) IVPB 1000 mg/200 mL premix  1,000 mg Intravenous Q24H Luiz Ochoa, RPH 200 mL/hr at 08/06/19 1555 1,000 mg at 08/06/19 1555    VITAL SIGNS: BP 90/62 (BP Location: Right Arm)   Pulse 87   Temp 98.2 F (36.8 C) (Oral)   Resp 16   Ht 6' 2"  (1.88 m)   Wt 52.6 kg   SpO2 100%   BMI 14.89 kg/m  Filed Weights   08/03/19 1135 08/05/19 1336  Weight: 51.3 kg 52.6 kg    Estimated body mass index is 14.89 kg/m as calculated from the following:   Height as of this encounter: 6' 2"  (1.88 m).   Weight as of this  encounter: 52.6 kg.  LABS: CBC:    Component Value Date/Time   WBC 0.9 (LL) 08/07/2019 0412   HGB 8.5 (L) 08/07/2019 0412   HGB 8.2 (L) 07/31/2019 1125   HCT 26.2 (L) 08/07/2019 0412   HCT 26.0 (L) 04/10/2019 0450   PLT 32 (L) 08/07/2019 0412   PLT 88 (L) 07/31/2019 1125   Comprehensive Metabolic Panel:    Component Value Date/Time   NA 136 08/06/2019 0739   K 3.1 (L) 08/06/2019 0739   CO2 32 08/06/2019 0739   BUN 19 08/06/2019 0739   CREATININE 1.14 08/06/2019 0739   CREATININE 1.01 07/31/2019 1125   ALBUMIN 2.7 (L) 08/03/2019 1204     Review of Systems  Constitutional: Positive for appetite change.  Respiratory: Positive for shortness of breath.   Neurological: Positive for weakness.   Unless otherwise noted, a complete review of systems is negative.  Physical Exam General: NAD, frail chronically -ill appearing, cachectic Cardiovascular: regular rate and rhythm PTW:[id number], dried bleeding areas noted to lips Pulmonary: diminished bilaterally  Abdomen: soft, nontender, + bowel sounds Extremities: no edema, no joint deformities Skin: no rashes Neurological: awake, A&O x3, mood appropriate   Prognosis: Guarded to Poor. Patient prognosis is poor in the setting of stage IV NSCLC, cachectic, severe protein calorie malnutrition, poor functional state, hypotension, odynophagia, and respiratory failure. He seems to not be tolerating treatments as expected. If patient decided to discontinue aggressive treatments (chemotherapy) he would be appropriate for hospice outpatient and or hospice home. I would recommend outpatient palliative at minimum to continue with difficult discussions however, he is declining both hospice and palliative.   Discharge Planning:  To Be Determined  Recommendations:  Full Code/Full Scope as requested by patient  Continue with current plan of car per medical team  Patient remains hopeful for some stability. Requesting to continue  with full  aggressive medical interventions/treatments despite lengthy discussion regarding prognosis and current condition.  Also discussed full code status at length however patient requested to remain full code but does report he plans to talk with family.   Would recommend palliative at minimum however patient is refusing.   PMT will continue to support and follow as needed.    Palliative Performance Scale: PPS 30%                Patient expressed understanding and was in agreement with this plan.   Thank you for allowing the Palliative Medicine Team to assist in the care of this patient.  Time In: 1015 Time Out: 1120 Time Total: 65 min.   Visit consisted of counseling and education dealing with the complex and emotionally intense issues of symptom management and palliative care in the setting of serious and potentially life-threatening illness.Greater than 50%  of this time was spent counseling and coordinating care related to the above assessment and plan.  Signed by:  Alda Lea, AGPCNP-BC Palliative Medicine Team  Phone: [phone] Fax: [phone] Pager: [phone] Amion: Bjorn Pippin

## 2019-08-08 DIAGNOSIS — C3491 Malignant neoplasm of unspecified part of right bronchus or lung: Secondary | ICD-10-CM

## 2019-08-08 DIAGNOSIS — E43 Unspecified severe protein-calorie malnutrition: Secondary | ICD-10-CM

## 2019-08-08 DIAGNOSIS — I959 Hypotension, unspecified: Secondary | ICD-10-CM

## 2019-08-08 DIAGNOSIS — Z515 Encounter for palliative care: Secondary | ICD-10-CM

## 2019-08-08 DIAGNOSIS — Z7189 Other specified counseling: Secondary | ICD-10-CM

## 2019-08-08 DIAGNOSIS — R5081 Fever presenting with conditions classified elsewhere: Secondary | ICD-10-CM

## 2019-08-08 DIAGNOSIS — D709 Neutropenia, unspecified: Secondary | ICD-10-CM

## 2019-08-08 LAB — CBC WITH DIFFERENTIAL/PLATELET
Abs Immature Granulocytes: 0.13 10*3/uL — ABNORMAL HIGH (ref 0.00–0.07)
Basophils Absolute: 0.1 10*3/uL (ref 0.0–0.1)
Basophils Relative: 2 %
Eosinophils Absolute: 0.1 10*3/uL (ref 0.0–0.5)
Eosinophils Relative: 3 %
HCT: 25.1 % — ABNORMAL LOW (ref 39.0–52.0)
Hemoglobin: 8 g/dL — ABNORMAL LOW (ref 13.0–17.0)
Immature Granulocytes: 6 %
Lymphocytes Relative: 11 %
Lymphs Abs: 0.2 10*3/uL — ABNORMAL LOW (ref 0.7–4.0)
MCH: 27.4 pg (ref 26.0–34.0)
MCHC: 31.9 g/dL (ref 30.0–36.0)
MCV: 86 fL (ref 80.0–100.0)
Monocytes Absolute: 0.4 10*3/uL (ref 0.1–1.0)
Monocytes Relative: 17 %
Neutro Abs: 1.3 10*3/uL — ABNORMAL LOW (ref 1.7–7.7)
Neutrophils Relative %: 61 %
Platelets: 19 10*3/uL — CL (ref 150–400)
RBC: 2.92 MIL/uL — ABNORMAL LOW (ref 4.22–5.81)
RDW: 17 % — ABNORMAL HIGH (ref 11.5–15.5)
WBC: 2.2 10*3/uL — ABNORMAL LOW (ref 4.0–10.5)
nRBC: 0 % (ref 0.0–0.2)

## 2019-08-08 LAB — CULTURE, BLOOD (ROUTINE X 2)
Culture: NO GROWTH
Culture: NO GROWTH

## 2019-08-08 LAB — BASIC METABOLIC PANEL
Anion gap: 10 (ref 5–15)
BUN: 24 mg/dL — ABNORMAL HIGH (ref 6–20)
CO2: 33 mmol/L — ABNORMAL HIGH (ref 22–32)
Calcium: 7.9 mg/dL — ABNORMAL LOW (ref 8.9–10.3)
Chloride: 96 mmol/L — ABNORMAL LOW (ref 98–111)
Creatinine, Ser: 1.08 mg/dL (ref 0.61–1.24)
GFR calc Af Amer: >60 mL/min (ref >60)
GFR calc non Af Amer: >60 mL/min (ref >60)
Glucose, Bld: 89 mg/dL (ref 70–99)
Potassium: 2.8 mmol/L — ABNORMAL LOW (ref 3.5–5.1)
Sodium: 139 mmol/L (ref 135–145)

## 2019-08-08 MED ORDER — POTASSIUM CHLORIDE CRYS ER 20 MEQ PO TBCR
40.0000 meq | EXTENDED_RELEASE_TABLET | ORAL | Status: DC
  Administered 2019-08-08: 40 meq via ORAL
  Filled 2019-08-08 (×2): qty 2

## 2019-08-08 MED ORDER — POTASSIUM CHLORIDE CRYS ER 20 MEQ PO TBCR: 40.0000 meq | EXTENDED_RELEASE_TABLET | Freq: Once | ORAL | Status: DC

## 2019-08-08 MED ORDER — AMOXICILLIN-POT CLAVULANATE 400-57 MG/5ML PO SUSR
880.0000 mg | Freq: Two times a day (BID) | ORAL | Status: DC
  Administered 2019-08-08 – 2019-08-10 (×5): 880 mg via ORAL
  Filled 2019-08-08 (×6): qty 11

## 2019-08-08 MED ORDER — POTASSIUM CHLORIDE 10 MEQ/100ML IV SOLN
10.0000 meq | INTRAVENOUS | Status: AC
  Administered 2019-08-08 (×4): 10 meq via INTRAVENOUS
  Filled 2019-08-08 (×4): qty 100

## 2019-08-08 NOTE — Progress Notes (Signed)
PHARMACY NOTE -  Fluconazole  Pharmacy has been assisting with dosing of Diflucan for oropharyngeal vs esophageal candidiasis.  Dosage remains stable at 200 mg IV daily and need for further dosage adjustment appears unlikely at present given SCr at baseline  Pharmacy will sign off, following peripherally for culture results or dose adjustments. Please reconsult if a change in clinical status warrants re-evaluation of dosage.  Reuel Boom, PharmD, BCPS [phone] 08/08/2019, 10:56 AM

## 2019-08-08 NOTE — Social Work (Signed)
[place] Work  Clinical Social Work received multiple voicemail's from patient requesting CSW visit patient in hospital. Tyler [name] shared he's very weak and experiencing pain, but is eager to discharge home.  CSW explored patient's concerns and needs. Tyler [name] spouse is unable to provide total assistance and patient may need additional support at home.  CSW reviewed options of Hospice at home, Home health, and palliative care consult- to encourage patient to discuss this with his inpatient team and medical oncology.  Patient plans to contact CSW if he has other questions or concerns.  Gwinda Maine, LCSW  Clinical Social Worker [facility]

## 2019-08-08 NOTE — Progress Notes (Signed)
Physical Therapy Treatment Patient Details Name: Tyler [name] MRN: [id number] DOB: 05/01/1961 Today's Date: 08/08/2019    History of Present Illness 59 y.o. male with medical history significant of permanent atrial fibrillation, stage IV non-small cell lung cancer/adenocarcinoma on current chemotherapy, history of pulmonary embolism, EtOH abuse, tobacco abuse.  Pt admitted for Acute respiratory failure with hypoxia, right sided pneumonia and neutropenic fever.    PT Comments    Assisted OOB to amb.  General bed mobility comments: use of rail and increased time self able.  General transfer comment: min/guard for safety, pt self steadying upon rise with UEs "let me do it" General Gait Details: had pt use RW for increased support and to increase gait distance despite pt's reluctancy to use a walker.  Tolerated an increased distance on RA which decreased from 92 at rest 85 with activity.  Hr increased from 88 to 130's with moderate fatigue. Deconditioned.    Follow Up Recommendations  Home health PT;Supervision/Assistance - 24 hour     Equipment Recommendations  None recommended by PT    Recommendations for Other Services       Precautions / Restrictions Precautions Precautions: Fall Precaution Comments: monitor HR and sats Restrictions Weight Bearing Restrictions: No    Mobility  Bed Mobility Overal bed mobility: Needs Assistance Bed Mobility: Supine to Sit     Supine to sit: Supervision     General bed mobility comments: use of rail and increased time self able  Transfers Overall transfer level: Needs assistance Equipment used: Rolling walker (2 wheeled) Transfers: Sit to/from [facility] Sit to Stand: Supervision;Min guard Stand pivot transfers: Supervision;Min guard       General transfer comment: min/guard for safety, pt self steadying upon rise with UEs "let me do it"  Ambulation/Gait Ambulation/Gait assistance: Supervision;Min guard Gait  Distance (Feet): 150 Feet(75 feet x 2 one seated rest break) Assistive device: Rolling walker (2 wheeled) Gait Pattern/deviations: Step-through pattern;Decreased stride length;Narrow base of support Gait velocity: decreased   General Gait Details: had pt use RW for increased support and to increase gait distance despite pt's reluctancy to use a walker.  Tolerated an increased distance on RA which decreased from 92 at rest 85 with activity.  Hr increased from 88 to 130's with moderate fatigue.   Stairs             Wheelchair Mobility    Modified Rankin (Stroke Patients Only)       Balance                                            Cognition Arousal/Alertness: Awake/alert Behavior During Therapy: WFL for tasks assessed/performed Overall Cognitive Status: Within Functional Limits for tasks assessed                                        Exercises      General Comments        Pertinent Vitals/Pain Pain Assessment: No/denies pain    Home Living                      Prior Function            PT Goals (current goals can now be found in the care plan section) Progress towards  PT goals: Progressing toward goals    Frequency    Min 3X/week      PT Plan Current plan remains appropriate    Co-evaluation              AM-PAC PT "6 Clicks" Mobility   Outcome Measure  Help needed turning from your back to your side while in a flat bed without using bedrails?: A Little Help needed moving from lying on your back to sitting on the side of a flat bed without using bedrails?: A Little Help needed moving to and from a bed to a chair (including a wheelchair)?: A Little Help needed standing up from a chair using your arms (e.g., wheelchair or bedside chair)?: A Little Help needed to walk in hospital room?: A Little Help needed climbing 3-5 steps with a railing? : A Lot 6 Click Score: 17    End of Session Equipment  Utilized During Treatment: Gait belt Activity Tolerance: Patient limited by fatigue Patient left: in chair;with call bell/phone within reach Nurse Communication: Mobility status PT Visit Diagnosis: Other abnormalities of gait and mobility (R26.89)     Time: 1005-1030 PT Time Calculation (min) (ACUTE ONLY): 25 min  Charges:  $Gait Training: 8-22 mins $Therapeutic Activity: 8-22 mins                     Rica Koyanagi  PTA Acute  Rehabilitation Services Pager      [phone] Office      [phone]

## 2019-08-08 NOTE — TOC Progression Note (Signed)
Transition of Care [facility]) - Progression Note    Patient Details  Name: Tyler [name] MRN: [id number] Date of Birth: [birth year]  [facility] CM/SW Contact  Purcell Mouton, RN Phone Number: 08/08/2019, 3:46 PM  Clinical Narrative:     TOC following for discharge needs. Checking for [facility] agencies now, pt has Medicaid.        Expected Discharge Plan and Services                                                 Social Determinants of Health (SDOH) Interventions    Readmission Risk Interventions No flowsheet data found.

## 2019-08-08 NOTE — Progress Notes (Signed)
CRITICAL VALUE ALERT  Critical Value:  Platelets 19  Date & Time Notied: 08/08/19; 0410  Provider Notified: PCP on call Baltazar Najjar)  Orders Received/Actions taken: awaiting any new orders.

## 2019-08-08 NOTE — Progress Notes (Signed)
PROGRESS NOTE  Tyler [name]  DOB: [birth year]  PCP: Brand Males, MD YNW:[id number]  DOA: 08/03/2019 Admitted From: Home  LOS: 5 days   Chief Complaint  Patient presents with  . Shortness of Breath   Brief narrative: Tyler [name] a 59 y.o.malewith medical history significant ofpermanent atrial fibrillation, stage IV non-small cell lung cancer/adenocarcinoma on current chemotherapy, history of pulmonary embolism, EtOH abuse, tobacco abuse. On 1/22, patient was sent to the ED from oncology clinic where he presented with cough, fever, progressive shortness of breath, progressively worsening for 2 to 3 days with associated dizziness,fatigue, weakness,especially when standing.   In the ED, patient was hypoxic and required supplemental oxygen at 2 L/min. Work-up showed WBC count 0.4, hemoglobin 7.7, platelet 21.   Rapid Covid-19 negative.   Chest x-ray with persistent right base atelectasis versus infiltrate with right pleural effusion.   Patient was started on vancomycin, cefepime, Flagyl for neutropenic fever.   Admitted to hospitalist service for further evaluation and management  Subjective: Patient was seen and examined this morning.  Middle-aged African-American male.   No new symptoms.  No acute bleeding. Has intermittent episodes of difficulty swallowing, partially relieved with viscous lidocaine.  Assessment/Plan: Acute respiratory failure with hypoxia Right-sided pneumonia Neutropenic fever -Presented with fever, progressively worsening shortness of breath. Right base infiltrate was noted in chest x-ray. -Started on broad-spectrum antibiotic coverage with vancomycin, cefepime and Flagyl.  -Culture reports negative so far. -We will de-escalate antibiotics to Augmentin oral.   -Remains on oxygen supplementation.  Qualifies for home oxygen.  Stage IV adenocarcinoma right lung -Patient is followed up by medical oncology Dr. [name]. He is still on  chemotherapy.  -Oncology consult appreciated.  Patient apparently wants to continue aggressive care and is not interested in palliative/hospice. -Patient will follow-up with oncology as an outpatient.  Symptomatic anemia. Pancytopenia -This is likely from active chemotherapy.  -Patient is getting Granix.  Also received 2 units of PRBC on 1/22 and 1 unit of platelets on 1/25. -Labs from this morning shows WBC count, 2.2, absolute neutrophil count 1.3, hemoglobin 8, platelet 19. -Repeat labs tomorrow.  Odynophagia -Mucositis versus esophageal candidiasis  -Continue fluconazole, Magic mouthwash and viscous lidocaine.    Permanent atrial fibrillation -Continue metoprolol 25 mg twice daily -Continue to hold Xarelto 20 mg daily due to severely low platelet count  Hxpulmonary embolism Continue to hold Xarelto 20 mg daily due to thrombocytopenia  Prognosis -overall prognosis poor.  Oncology to discuss goals of care.  Palliative care consult ordered as well.  Malnutrition - Body mass index is 14.89 kg/m.   Mobility: PT eval Diet: Currently remains on dysphagia 3 diet Fluid: None DVT prophylaxis:  SCDs Code Status:  Full code Family Communication:  None at bedside Expected Discharge:  Continue inpatient management.  Consultants:  Oncology, palliative care  Procedures:  None  Antimicrobials: Anti-infectives (From admission, onward)   Start     Dose/Rate Route Frequency Ordered Stop   08/08/19 1100  amoxicillin-clavulanate (AUGMENTIN) 400-57 MG/5ML suspension 880 mg     880 mg of amoxicillin Oral Every 12 hours 08/08/19 1055     08/05/19 1600  vancomycin (VANCOCIN) IVPB 1000 mg/200 mL premix  Status:  Discontinued     1,000 mg 200 mL/hr over 60 Minutes Intravenous Every 24 hours 08/05/19 1419 08/08/19 1055   08/05/19 1400  fluconazole (DIFLUCAN) IVPB 200 mg     200 mg 100 mL/hr over 60 Minutes Intravenous Every 24 hours 08/05/19 1241  08/04/19 1800  fluconazole  (DIFLUCAN) IVPB 100 mg  Status:  Discontinued     100 mg 50 mL/hr over 60 Minutes Intravenous Every 24 hours 08/03/19 1552 08/05/19 1241   08/04/19 1600  fluconazole (DIFLUCAN) IVPB 100 mg  Status:  Discontinued     100 mg 50 mL/hr over 60 Minutes Intravenous Every 24 hours 08/03/19 1510 08/03/19 1552   08/04/19 1600  vancomycin (VANCOREADY) IVPB 750 mg/150 mL  Status:  Discontinued     750 mg 150 mL/hr over 60 Minutes Intravenous Every 24 hours 08/03/19 1552 08/05/19 1419   08/04/19 0400  ceFEPIme (MAXIPIME) 2 g in sodium chloride 0.9 % 100 mL IVPB  Status:  Discontinued     2 g 200 mL/hr over 30 Minutes Intravenous Every 12 hours 08/03/19 1552 08/08/19 1055   08/03/19 1800  fluconazole (DIFLUCAN) IVPB 200 mg     200 mg 100 mL/hr over 60 Minutes Intravenous  Once 08/03/19 1552 08/03/19 1720   08/03/19 1600  fluconazole (DIFLUCAN) IVPB 200 mg  Status:  Discontinued     200 mg 100 mL/hr over 60 Minutes Intravenous  Once 08/03/19 1510 08/03/19 1552   08/03/19 1400  vancomycin (VANCOCIN) IVPB 1000 mg/200 mL premix     1,000 mg 200 mL/hr over 60 Minutes Intravenous  Once 08/03/19 1347 08/03/19 1700   08/03/19 1400  ceFEPIme (MAXIPIME) 1 g in sodium chloride 0.9 % 100 mL IVPB  Status:  Discontinued     1 g 200 mL/hr over 30 Minutes Intravenous  Once 08/03/19 1347 08/03/19 1348   08/03/19 1400  metroNIDAZOLE (FLAGYL) IVPB 500 mg  Status:  Discontinued     500 mg 100 mL/hr over 60 Minutes Intravenous  Once 08/03/19 1347 08/03/19 1523   08/03/19 1400  ceFEPIme (MAXIPIME) 2 g in sodium chloride 0.9 % 100 mL IVPB     2 g 200 mL/hr over 30 Minutes Intravenous  Once 08/03/19 1348 08/03/19 1747        Code Status: Full Code   Diet Order            DIET DYS 3 Room service appropriate? Yes with Assist; Fluid consistency: Thin  Diet effective now              Infusions:  . fluconazole (DIFLUCAN) IV 200 mg (08/08/19 1442)  . potassium chloride      Scheduled Meds: .  amoxicillin-clavulanate  880 mg of amoxicillin Oral Q12H  . feeding supplement (ENSURE ENLIVE)  237 mL Oral TID BM  . folic acid  1 mg Oral Daily  . lidocaine  15 mL Mouth/Throat TID AC  . metoprolol tartrate  25 mg Oral BID  . multivitamin with minerals  1 tablet Oral Daily  . potassium chloride SA  20 mEq Oral BID  . Tbo-filgastrim (GRANIX) SQ  300 mcg Subcutaneous q1800    PRN meds: acetaminophen **OR** acetaminophen, bisacodyl, guaiFENesin-dextromethorphan, heparin lock flush, lidocaine, lip balm, magic mouthwash w/lidocaine, metoprolol tartrate, ondansetron **OR** ondansetron (ZOFRAN) IV, oxyCODONE-acetaminophen, phenol, senna-docusate, sodium chloride flush, sodium chloride flush, sodium chloride flush, sodium chloride flush   Objective: Vitals:   08/08/19 0917 08/08/19 1449  BP: 112/82 115/78  Pulse: 74 89  Resp:  19  Temp:  98 F (36.7 C)  SpO2:  100%    Intake/Output Summary (Last 24 hours) at 08/08/2019 1635 Last data filed at 08/08/2019 1442 Gross per 24 hour  Intake 820 ml  Output 1475 ml  Net -655 ml   Filed  Weights   08/03/19 1135 08/05/19 1336  Weight: 51.3 kg 52.6 kg   Weight change:  Body mass index is 14.89 kg/m.   Physical Exam: General exam: Middle-aged African-American male.  Not in distress Skin: No rashes, lesions or ulcers. HEENT: Atraumatic, normocephalic, supple neck, no obvious bleeding noted in his lips today Oral exam: No blood clots noted on the lips today.  No oral ulcers noted.   Lungs: Clear to auscultation bilaterally CVS: Regular rate and rhythm, no murmur GI/Abd soft, nontender, nondistended, bowel sound present CNS: Alert, awake, slow to respond, oriented to place and person Psychiatry: Depressed look Extremities: No pedal edema, no calf tenderness  Data Review: I have personally reviewed the laboratory data and studies available.  Recent Labs  Lab 08/04/19 0247 08/04/19 0247 08/05/19 0251 08/06/19 0739 08/06/19 1417  08/07/19 0412 08/08/19 0256  WBC 0.3*   < > 0.2* 0.5* 0.5* 0.9* 2.2*  NEUTROABS 0.0*  --  0.0*  --  0.1* 0.4* 1.3*  HGB 9.7*   < > 10.5* 9.9* 9.0* 8.5* 8.0*  HCT 28.8*   < > 32.2* 30.7* 28.1* 26.2* 25.1*  MCV 80.2   < > 84.3 85.3 85.2 84.8 86.0  PLT 8*   < > 7* <5* 44* 32* 19*   < > = values in this interval not displayed.   Recent Labs  Lab 08/03/19 1204 08/04/19 0247 08/05/19 0251 08/06/19 0739 08/08/19 0256  NA 134* 131* 137 136 139  K 3.5 3.0* 2.6* 3.1* 2.8*  CL 88* 92* 96* 92* 96*  CO2 31 27 30  32 33*  GLUCOSE 139* 82 95 88 89  BUN 17 18 20 19  24*  CREATININE 1.29* 1.17 1.06 1.14 1.08  CALCIUM 7.8* 7.0* 7.7* 8.1* 7.9*  MG  --  0.9* 2.0  --   --     Terrilee Croak, MD  [facility] 08/08/2019

## 2019-08-08 NOTE — Progress Notes (Signed)
Daily Progress Note   Patient Name: Tyler [name]       Date: 08/08/2019 DOB: 11-20-1960  Age: 59 y.o. MRN#: [id number] Attending Physician: Terrilee Croak, MD Primary Care Physician: Brand Males, MD Admit Date: 08/03/2019  Reason for Consultation/Follow-up: Establishing goals of care  Subjective: Patient awake and watching tv. Denies pain or shortness of breath. Does report some shortness of breath with over exertion at times. Continues to require supplemental oxygen. He denies use of home oxygen. Discussed possibility of needing in the future. He verbalized understanding.   We reviewed goals of care discussion from yesterday. Patient reports he is still relying on his oncology team for guidance in decisions. We discussed his quality of life and prognosis. Patient closed eyes during conversation and became silent. Silence allowed with no interruption. Patient reports he plans to still fight as long as he can. We discussed need for home support and care needs. He reports he feels his family will be able to provide this.   I discussed hospice again and also palliative. Patient reports if he is still able to be treated and work with PT that is what he would like. He does agree to outpatient palliative support. Patient reports he thinks the extra layer of support will be helpful for him and his family. Support given.   He states his goal today is to hopefully work with PT and not experience shortness of breath or tachycardia. He shares his heart races when working with them and he becomes fatigued. We discussed signs of deconditioned as well as how sometimes the mind is stronger than the actual body is and can be. He verbalized understanding and again expressed "he has to keep trying".   Length  of Stay: 5  Current Medications: Scheduled Meds:  . amoxicillin-clavulanate  880 mg of amoxicillin Oral Q12H  . feeding supplement (ENSURE ENLIVE)  237 mL Oral TID BM  . folic acid  1 mg Oral Daily  . lidocaine  15 mL Mouth/Throat TID AC  . metoprolol tartrate  25 mg Oral BID  . multivitamin with minerals  1 tablet Oral Daily  . potassium chloride SA  20 mEq Oral BID  . Tbo-filgastrim (GRANIX) SQ  300 mcg Subcutaneous q1800    Continuous Infusions: . fluconazole (DIFLUCAN) IV Stopped (08/07/19 1547)  PRN Meds: acetaminophen **OR** acetaminophen, bisacodyl, guaiFENesin-dextromethorphan, heparin lock flush, lidocaine, lip balm, magic mouthwash w/lidocaine, metoprolol tartrate, ondansetron **OR** ondansetron (ZOFRAN) IV, oxyCODONE-acetaminophen, phenol, senna-docusate, sodium chloride flush, sodium chloride flush, sodium chloride flush, sodium chloride flush     Vital Signs: BP 112/82   Pulse 74   Temp (!) 97.4 F (36.3 C) (Oral)   Resp 18   Ht 6\' 2"  (1.88 m)   Wt 52.6 kg   SpO2 93%   BMI 14.89 kg/m  SpO2: SpO2: 93 % O2 Device: O2 Device: Nasal Cannula O2 Flow Rate: O2 Flow Rate (L/min): 2 L/min  Intake/output summary:   Intake/Output Summary (Last 24 hours) at 08/08/2019 1359 Last data filed at 08/08/2019 1200 Gross per 24 hour  Intake 820 ml  Output 1350 ml  Net -530 ml   LBM: Last BM Date: 08/06/19 Baseline Weight: Weight: 51.3 kg Most recent weight: Weight: 52.6 kg       Palliative Assessment/Data: PPS 30%      Patient Active Problem List   Diagnosis Date Noted  . Febrile neutropenia ([place]) 08/03/2019  . Persistent atrial fibrillation ([place]) 08/03/2019  . History of pulmonary embolism 08/03/2019  . Mucositis 07/31/2019  . Adenocarcinoma of right lung, stage 4 ([place]) 05/15/2019  . Encounter for antineoplastic immunotherapy 05/15/2019  . Malignant neoplasm of right lung ([place])   . AF (paroxysmal atrial fibrillation) ([place]) 04/27/2019  . Atrial fibrillation  with RVR ([place]) 04/25/2019  . Malnutrition of moderate degree 04/21/2019  . Aspiration pneumonia ([place]) 04/19/2019  . PNA (pneumonia) 04/19/2019  . Loculated pleural effusion   . Acute pulmonary embolism ([place]) 04/07/2019  . Pulmonary embolism ([place]) 04/06/2019  . Anemia 04/06/2019  . Hypokalemia 04/06/2019  . Malignant neoplasm of bronchus of right lower lobe ([place]) 03/01/2019  . Adenocarcinoma of right lung, stage 3 ([place]) 01/17/2019  . Goals of care, counseling/discussion 01/17/2019  . Encounter for antineoplastic chemotherapy 01/17/2019  . Tobacco abuse 01/10/2019  . Mediastinal adenopathy 01/10/2019  . Right upper lobe pulmonary nodule 04/19/2018  . Multiple pulmonary nodules 04/02/2018  . Right lower lobe pulmonary nodule 04/02/2018  . Protein-calorie malnutrition, severe 08/09/2017  . Acute lung injury   . Pneumonitis   . BOOP (bronchiolitis obliterans with organizing pneumonia) ([place])   . Acute respiratory failure with hypoxia ([place]) 08/08/2017  . Hypoxia 08/07/2017  . Dehydration   . Diarrhea   . CAP (community acquired pneumonia) 08/04/2017  . Hyponatremia 08/04/2017  . Tachycardia 08/04/2017  . Influenza A     Palliative Care Assessment & Plan   Recommendations/Plan:  Continue with current plan of care per medical team  Patient remains hopeful and in reliance of Oncology team for further guidance.   He is requesting outpatient palliative support. Referral placed. This will be important for him allowing continued goals of care discussions and support. Continues to decline hospice. He is aware they may come into the home vs. Residential facility as he experienced in the past.   PMT will continue to support and follow as needed.   Goals of Care and Additional Recommendations:  Limitations on Scope of Treatment: Full Scope Treatment  Code Status:    Code Status Orders  (From admission, onward)         Start     Ordered   08/03/19 1524  Full code  Continuous       08/03/19 1523        Code Status History    Date Active Date Inactive Code Status Order  ID Comments User Context   04/28/2019 1134 05/02/2019 2056 DNR [id number]  Garner Nash, DO Inpatient   04/19/2019 1832 04/28/2019 1134 Full Code [id number]  Nita Sells, MD ED   04/07/2019 0320 04/10/2019 1813 Full Code [id number]  Gwynne Edinger, MD ED   08/04/2017 0135 08/11/2017 1543 Full Code [id number]  Jani Gravel, MD Inpatient   Advance Care Planning Activity       Prognosis:  Guarded to Poor  Discharge Planning:  Home with Palliative Services   Thank you for allowing the Palliative Medicine Team to assist in the care of this patient.  Time Total: 35 min.   Visit consisted of counseling and education dealing with the complex and emotionally intense issues of symptom management and palliative care in the setting of serious and potentially life-threatening illness.Greater than 50%  of this time was spent counseling and coordinating care related to the above assessment and plan.  Alda Lea, AGPCNP-BC  Palliative Medicine Team [phone]   Please contact Palliative Medicine Team phone at [phone] for questions and concerns.

## 2019-08-08 NOTE — Progress Notes (Signed)
PHYSICAL THERAPY  SATURATION QUALIFICATIONS: (This note is used to comply with regulatory documentation for home oxygen)  Patient Saturations on Room Air at Rest = 92%  Patient Saturations on Room Air while Ambulating 75 feet  = 85% with HR 130"s 2/4 dyspnea  Patient Saturations on 2 Liters of oxygen while Ambulating = 90%  Please briefly explain why patient needs home oxygen:  Pt requires supplemental oxygen to achieve therapeutic levels  Rica Koyanagi  PTA Acute  Rehabilitation Services Pager      [phone] Office      [phone]

## 2019-08-09 LAB — CBC WITH DIFFERENTIAL/PLATELET
Abs Immature Granulocytes: 0.29 10*3/uL — ABNORMAL HIGH (ref 0.00–0.07)
Basophils Absolute: 0.1 10*3/uL (ref 0.0–0.1)
Basophils Relative: 1 %
Eosinophils Absolute: 0.1 10*3/uL (ref 0.0–0.5)
Eosinophils Relative: 3 %
HCT: 25.6 % — ABNORMAL LOW (ref 39.0–52.0)
Hemoglobin: 8.1 g/dL — ABNORMAL LOW (ref 13.0–17.0)
Immature Granulocytes: 6 %
Lymphocytes Relative: 9 %
Lymphs Abs: 0.4 10*3/uL — ABNORMAL LOW (ref 0.7–4.0)
MCH: 27.7 pg (ref 26.0–34.0)
MCHC: 31.6 g/dL (ref 30.0–36.0)
MCV: 87.7 fL (ref 80.0–100.0)
Monocytes Absolute: 0.6 10*3/uL (ref 0.1–1.0)
Monocytes Relative: 11 %
Neutro Abs: 3.5 10*3/uL (ref 1.7–7.7)
Neutrophils Relative %: 70 %
Platelets: 13 10*3/uL — CL (ref 150–400)
RBC: 2.92 MIL/uL — ABNORMAL LOW (ref 4.22–5.81)
RDW: 17.2 % — ABNORMAL HIGH (ref 11.5–15.5)
WBC: 5 10*3/uL (ref 4.0–10.5)
nRBC: 0 % (ref 0.0–0.2)

## 2019-08-09 LAB — BASIC METABOLIC PANEL
Anion gap: 9 (ref 5–15)
BUN: 26 mg/dL — ABNORMAL HIGH (ref 6–20)
CO2: 32 mmol/L (ref 22–32)
Calcium: 8 mg/dL — ABNORMAL LOW (ref 8.9–10.3)
Chloride: 97 mmol/L — ABNORMAL LOW (ref 98–111)
Creatinine, Ser: 1.29 mg/dL — ABNORMAL HIGH (ref 0.61–1.24)
GFR calc Af Amer: >60 mL/min (ref >60)
GFR calc non Af Amer: 60 mL/min — ABNORMAL LOW (ref >60)
Glucose, Bld: 88 mg/dL (ref 70–99)
Potassium: 3.9 mmol/L (ref 3.5–5.1)
Sodium: 138 mmol/L (ref 135–145)

## 2019-08-09 MED ORDER — FLUCONAZOLE 100 MG PO TABS
200.0000 mg | ORAL_TABLET | Freq: Every day | ORAL | Status: DC
  Administered 2019-08-09 – 2019-08-10 (×2): 200 mg via ORAL
  Filled 2019-08-09 (×2): qty 2

## 2019-08-09 MED ORDER — SODIUM CHLORIDE 0.9 % IV SOLN: INTRAVENOUS | Status: DC

## 2019-08-09 NOTE — Progress Notes (Signed)
Physical Therapy Treatment Patient Details Name: Tyler [name] MRN: [id number] DOB: 01-Jan-1961 Today's Date: 08/09/2019    History of Present Illness 59 y.o. male with medical history significant of permanent atrial fibrillation, stage IV non-small cell lung cancer/adenocarcinoma on current chemotherapy, history of pulmonary embolism, EtOH abuse, tobacco abuse.  Pt admitted for Acute respiratory failure with hypoxia, right sided pneumonia and neutropenic fever.    PT Comments    Assisted OOB to amb in hallway.  General Gait Details: had pt use RW for increased support and to increase gait distance despite pt's reluctancy to use a walker.  Tolerated an increased distance.  Returned to room and assisted to bathroom.    Follow Up Recommendations  Home health PT;Supervision/Assistance - 24 hour     Equipment Recommendations  None recommended by PT    Recommendations for Other Services       Precautions / Restrictions Precautions Precautions: Fall Precaution Comments: monitor HR and sats Restrictions Weight Bearing Restrictions: No    Mobility  Bed Mobility Overal bed mobility: Modified Independent             General bed mobility comments: self able with increased time  Transfers Overall transfer level: Needs assistance Equipment used: Rolling walker (2 wheeled) Transfers: Sit to/from [facility] Sit to Stand: Supervision Stand pivot transfers: Supervision       General transfer comment: Supervision for safety, pt self steadying upon rise with UEs "let me do it"  Ambulation/Gait Ambulation/Gait assistance: Supervision Gait Distance (Feet): 155 Feet Assistive device: Rolling walker (2 wheeled) Gait Pattern/deviations: Step-through pattern;Decreased stride length;Narrow base of support Gait velocity: decreased   General Gait Details: had pt use RW for increased support and to increase gait distance despite pt's reluctancy to use a walker.   Tolerated an increased distance on RA which decreased from 92 at rest 85 with activity.  Hr increased from 88 to 130's with moderate fatigue.   Stairs             Wheelchair Mobility    Modified Rankin (Stroke Patients Only)       Balance                                            Cognition Arousal/Alertness: Awake/alert Behavior During Therapy: WFL for tasks assessed/performed Overall Cognitive Status: Within Functional Limits for tasks assessed                                        Exercises      General Comments        Pertinent Vitals/Pain Pain Assessment: No/denies pain    Home Living                      Prior Function            PT Goals (current goals can now be found in the care plan section) Progress towards PT goals: Progressing toward goals    Frequency    Min 3X/week      PT Plan Current plan remains appropriate    Co-evaluation              AM-PAC PT "6 Clicks" Mobility   Outcome Measure  Help needed turning from your back to your  side while in a flat bed without using bedrails?: None Help needed moving from lying on your back to sitting on the side of a flat bed without using bedrails?: None Help needed moving to and from a bed to a chair (including a wheelchair)?: None Help needed standing up from a chair using your arms (e.g., wheelchair or bedside chair)?: A Little Help needed to walk in hospital room?: A Little Help needed climbing 3-5 steps with a railing? : A Lot 6 Click Score: 20    End of Session Equipment Utilized During Treatment: Gait belt Activity Tolerance: Patient tolerated treatment well Patient left: in bed;with call bell/phone within reach Nurse Communication: Mobility status PT Visit Diagnosis: Other abnormalities of gait and mobility (R26.89)     Time: 1594-7076 PT Time Calculation (min) (ACUTE ONLY): 28 min  Charges:  $Gait Training: 8-22  mins $Therapeutic Activity: 8-22 mins                     Rica Koyanagi  PTA Acute  Rehabilitation Services Pager      [phone] Office      [phone]

## 2019-08-09 NOTE — Progress Notes (Signed)
PROGRESS NOTE  Tyler [name]  DOB: 02/11/1961  PCP: Brand Males, MD EXB:[id number]  DOA: 08/03/2019 Admitted From: Home  LOS: 6 days   Chief Complaint  Patient presents with  . Shortness of Breath   Brief narrative: Tyler [name] a 59 y.o.malewith medical history significant ofpermanent atrial fibrillation, stage IV non-small cell lung cancer/adenocarcinoma on current chemotherapy, history of pulmonary embolism, EtOH abuse, tobacco abuse. On 1/22, patient was sent to the ED from oncology clinic where he presented with cough, fever, progressive shortness of breath, progressively worsening for 2 to 3 days with associated dizziness,fatigue, weakness,especially when standing.   In the ED, patient was hypoxic and required supplemental oxygen at 2 L/min. Work-up showed WBC count 0.4, hemoglobin 7.7, platelet 21.   Rapid Covid-19 negative.   Chest x-ray with persistent right base atelectasis versus infiltrate with right pleural effusion.   Patient was started on vancomycin, cefepime, Flagyl for neutropenic fever.   Admitted to hospitalist service for further evaluation and management  Subjective: Patient was seen and examined this morning.  Feels weak.  Feels congested.  States that his wife at home is also disabled and not much able to take care of him. Labs from this morning noted, creatinine up to 1.29.  Platelet down to 13.  Assessment/Plan: Acute respiratory failure with hypoxia Right-sided pneumonia Neutropenic fever -Presented with fever, progressively worsening shortness of breath. Right base infiltrate was noted in chest x-ray. -Started on broad-spectrum antibiotic coverage with vancomycin, cefepime and Flagyl.  -Culture reports negative so far. -Antibiotics have been deescalated to oral Augmentin. -Remains on oxygen supplementation.  Qualifies for home oxygen.  Stage IV adenocarcinoma right lung -Patient is followed up by medical oncology Dr. [name].  He is still on chemotherapy.  -Oncology consult appreciated.  Patient apparently wants to continue aggressive care and is not interested in palliative/hospice. -Patient will follow-up with oncology as an outpatient.  Symptomatic anemia. Pancytopenia -This is likely from active chemotherapy.  Patient has been receiving Granix.-Also received 2 units of PRBC on 1/22 and 1 unit of platelets on 1/25. -Labs from this morning shows improving WBC count, stable hemoglobin but platelet count is dropping, down to 13 today.  Noted a plan from oncology to repeat platelet tomorrow and transfuse if needed.  Acute kidney injury -Creatinine normal at baseline.  Creatinine elevated to 1.29.  Will start normal saline at 75 mill per hour  Odynophagia -Mucositis versus esophageal candidiasis  -Continue fluconazole, Magic mouthwash and viscous lidocaine.   -Encourage oral appetite.  Permanent atrial fibrillation -Continue metoprolol 25 mg twice daily -Continue to hold Xarelto 20 mg daily due to severely low platelet count  Hxpulmonary embolism Continue to hold Xarelto 20 mg daily due to thrombocytopenia  Prognosis -overall prognosis poor.  Oncology to discuss goals of care.  Palliative care consult ordered as well.  Malnutrition - Body mass index is 14.89 kg/m.   Mobility: PT eval Diet: Currently remains on dysphagia 3 diet Fluid: None DVT prophylaxis:  SCDs Code Status:  Full code Family Communication:  None at bedside Expected Discharge:  Patient continues to drop platelets which needs to be monitored.  His creatinine level is elevated today acutely to 1.29.  Plan is to continue IV hydration, repeat creatinine level tomorrow.  Potential discharge home tomorrow with home health.  Consultants:  Oncology, palliative care  Procedures:  None  Antimicrobials: Anti-infectives (From admission, onward)   Start     Dose/Rate Route Frequency Ordered Stop   08/09/19 1000  fluconazole  (  DIFLUCAN) tablet 200 mg     200 mg Oral Daily 08/09/19 0845     08/08/19 1100  amoxicillin-clavulanate (AUGMENTIN) 400-57 MG/5ML suspension 880 mg     880 mg of amoxicillin Oral Every 12 hours 08/08/19 1055     08/05/19 1600  vancomycin (VANCOCIN) IVPB 1000 mg/200 mL premix  Status:  Discontinued     1,000 mg 200 mL/hr over 60 Minutes Intravenous Every 24 hours 08/05/19 1419 08/08/19 1055   08/05/19 1400  fluconazole (DIFLUCAN) IVPB 200 mg  Status:  Discontinued     200 mg 100 mL/hr over 60 Minutes Intravenous Every 24 hours 08/05/19 1241 08/09/19 0845   08/04/19 1800  fluconazole (DIFLUCAN) IVPB 100 mg  Status:  Discontinued     100 mg 50 mL/hr over 60 Minutes Intravenous Every 24 hours 08/03/19 1552 08/05/19 1241   08/04/19 1600  fluconazole (DIFLUCAN) IVPB 100 mg  Status:  Discontinued     100 mg 50 mL/hr over 60 Minutes Intravenous Every 24 hours 08/03/19 1510 08/03/19 1552   08/04/19 1600  vancomycin (VANCOREADY) IVPB 750 mg/150 mL  Status:  Discontinued     750 mg 150 mL/hr over 60 Minutes Intravenous Every 24 hours 08/03/19 1552 08/05/19 1419   08/04/19 0400  ceFEPIme (MAXIPIME) 2 g in sodium chloride 0.9 % 100 mL IVPB  Status:  Discontinued     2 g 200 mL/hr over 30 Minutes Intravenous Every 12 hours 08/03/19 1552 08/08/19 1055   08/03/19 1800  fluconazole (DIFLUCAN) IVPB 200 mg     200 mg 100 mL/hr over 60 Minutes Intravenous  Once 08/03/19 1552 08/03/19 1720   08/03/19 1600  fluconazole (DIFLUCAN) IVPB 200 mg  Status:  Discontinued     200 mg 100 mL/hr over 60 Minutes Intravenous  Once 08/03/19 1510 08/03/19 1552   08/03/19 1400  vancomycin (VANCOCIN) IVPB 1000 mg/200 mL premix     1,000 mg 200 mL/hr over 60 Minutes Intravenous  Once 08/03/19 1347 08/03/19 1700   08/03/19 1400  ceFEPIme (MAXIPIME) 1 g in sodium chloride 0.9 % 100 mL IVPB  Status:  Discontinued     1 g 200 mL/hr over 30 Minutes Intravenous  Once 08/03/19 1347 08/03/19 1348   08/03/19 1400  metroNIDAZOLE  (FLAGYL) IVPB 500 mg  Status:  Discontinued     500 mg 100 mL/hr over 60 Minutes Intravenous  Once 08/03/19 1347 08/03/19 1523   08/03/19 1400  ceFEPIme (MAXIPIME) 2 g in sodium chloride 0.9 % 100 mL IVPB     2 g 200 mL/hr over 30 Minutes Intravenous  Once 08/03/19 1348 08/03/19 1747        Code Status: Full Code   Diet Order            DIET DYS 3 Room service appropriate? Yes with Assist; Fluid consistency: Thin  Diet effective now              Infusions:  . sodium chloride      Scheduled Meds: . amoxicillin-clavulanate  880 mg of amoxicillin Oral Q12H  . feeding supplement (ENSURE ENLIVE)  237 mL Oral TID BM  . fluconazole  200 mg Oral Daily  . folic acid  1 mg Oral Daily  . lidocaine  15 mL Mouth/Throat TID AC  . metoprolol tartrate  25 mg Oral BID  . multivitamin with minerals  1 tablet Oral Daily  . potassium chloride SA  20 mEq Oral BID    PRN meds: acetaminophen **OR** acetaminophen,  bisacodyl, guaiFENesin-dextromethorphan, heparin lock flush, lidocaine, lip balm, magic mouthwash w/lidocaine, metoprolol tartrate, ondansetron **OR** ondansetron (ZOFRAN) IV, oxyCODONE-acetaminophen, phenol, senna-docusate, sodium chloride flush, sodium chloride flush   Objective: Vitals:   08/08/19 2010 08/09/19 0425  BP: 101/79 102/76  Pulse: 81 80  Resp: 18 20  Temp: 98.3 F (36.8 C) 98.5 F (36.9 C)  SpO2: 100% 100%    Intake/Output Summary (Last 24 hours) at 08/09/2019 1257 Last data filed at 08/09/2019 1638 Gross per 24 hour  Intake 700 ml  Output 1035 ml  Net -335 ml   Filed Weights   08/03/19 1135 08/05/19 1336  Weight: 51.3 kg 52.6 kg   Weight change:  Body mass index is 14.89 kg/m.   Physical Exam: General exam: Middle-aged African-American male.  Not in distress Skin: No rashes, lesions or ulcers. HEENT: Atraumatic, normocephalic, supple neck, no obvious bleeding noted in his lips today Oral exam: No blood clots noted on the lips today.  No oral ulcers  noted.   Lungs: Clear to auscultation bilaterally CVS: Regular rate and rhythm, no murmur GI/Abd soft, nontender, nondistended, bowel sound present CNS: Alert, awake, slow to respond, oriented to place and person Psychiatry: Depressed look Extremities: No pedal edema, no calf tenderness  Data Review: I have personally reviewed the laboratory data and studies available.  Recent Labs  Lab 08/05/19 0251 08/05/19 0251 08/06/19 0739 08/06/19 1417 08/07/19 0412 08/08/19 0256 08/09/19 0250  WBC 0.2*   < > 0.5* 0.5* 0.9* 2.2* 5.0  NEUTROABS 0.0*  --   --  0.1* 0.4* 1.3* 3.5  HGB 10.5*   < > 9.9* 9.0* 8.5* 8.0* 8.1*  HCT 32.2*   < > 30.7* 28.1* 26.2* 25.1* 25.6*  MCV 84.3   < > 85.3 85.2 84.8 86.0 87.7  PLT 7*   < > <5* 44* 32* 19* 13*   < > = values in this interval not displayed.   Recent Labs  Lab 08/04/19 0247 08/05/19 0251 08/06/19 0739 08/08/19 0256 08/09/19 0250  NA 131* 137 136 139 138  K 3.0* 2.6* 3.1* 2.8* 3.9  CL 92* 96* 92* 96* 97*  CO2 27 30 32 33* 32  GLUCOSE 82 95 88 89 88  BUN 18 20 19  24* 26*  CREATININE 1.17 1.06 1.14 1.08 1.29*  CALCIUM 7.0* 7.7* 8.1* 7.9* 8.0*  MG 0.9* 2.0  --   --   --     Terrilee Croak, MD  [facility] 08/09/2019

## 2019-08-09 NOTE — Progress Notes (Signed)
HEMATOLOGY-ONCOLOGY PROGRESS NOTE  SUBJECTIVE: Denies bleeding.  Still reporting a lot of pain to his mouth and throat.  Reports viscous lidocaine only lasts for a very short period time.  He has no other complaints this morning.  Oncology History  Adenocarcinoma of right lung, stage 3 ([place])  01/17/2019 Initial Diagnosis   Adenocarcinoma of right lung, stage 3 ([place])   01/29/2019 - 03/18/2019 Chemotherapy   The patient had palonosetron (ALOXI) injection 0.25 mg, 0.25 mg, Intravenous,  Once, 7 of 7 cycles Administration: 0.25 mg (01/29/2019), 0.25 mg (02/26/2019), 0.25 mg (03/05/2019), 0.25 mg (02/05/2019), 0.25 mg (03/12/2019), 0.25 mg (02/12/2019), 0.25 mg (02/19/2019) CARBOplatin (PARAPLATIN) 220 mg in sodium chloride 0.9 % 250 mL chemo infusion, 220 mg (100 % of original dose 222.2 mg), Intravenous,  Once, 7 of 7 cycles Dose modification: 222.2 mg (original dose 222.2 mg, Cycle 1) Administration: 220 mg (01/29/2019), 220 mg (02/26/2019), 220 mg (03/05/2019), 220 mg (03/12/2019), 220 mg (02/12/2019), 220 mg (02/19/2019) PACLitaxel (TAXOL) 84 mg in sodium chloride 0.9 % 250 mL chemo infusion (</= 80mg /m2), 45 mg/m2 = 84 mg, Intravenous,  Once, 7 of 7 cycles Administration: 84 mg (01/29/2019), 84 mg (02/26/2019), 84 mg (03/05/2019), 84 mg (02/05/2019), 84 mg (03/12/2019), 84 mg (02/12/2019), 84 mg (02/19/2019)  for chemotherapy treatment.    05/22/2019 -  Chemotherapy   The patient had palonosetron (ALOXI) injection 0.25 mg, 0.25 mg, Intravenous,  Once, 4 of 4 cycles Administration: 0.25 mg (05/22/2019), 0.25 mg (07/03/2019), 0.25 mg (07/24/2019) PEMEtrexed (ALIMTA) 900 mg in sodium chloride 0.9 % 100 mL chemo infusion, 517 mg/m2 = 850 mg, Intravenous,  Once, 4 of 6 cycles Administration: 900 mg (05/22/2019), 900 mg (07/03/2019), 800 mg (07/24/2019), 900 mg (06/12/2019) CARBOplatin (PARAPLATIN) 520 mg in sodium chloride 0.9 % 250 mL chemo infusion, 520 mg (100 % of original dose 517.5 mg), Intravenous,  Once, 4 of 4  cycles Dose modification: 517.5 mg (original dose 517.5 mg, Cycle 1), 517.5 mg (original dose 517.5 mg, Cycle 3), 517.5 mg (original dose 517.5 mg, Cycle 4), 517.5 mg (original dose 517.5 mg, Cycle 2) Administration: 520 mg (05/22/2019), 520 mg (07/03/2019), 520 mg (07/24/2019), 520 mg (06/12/2019) pembrolizumab (KEYTRUDA) 200 mg in sodium chloride 0.9 % 50 mL chemo infusion, 200 mg, Intravenous, Once, 4 of 6 cycles Administration: 200 mg (05/22/2019), 200 mg (07/03/2019), 200 mg (07/24/2019), 200 mg (06/12/2019) fosaprepitant (EMEND) 150 mg, dexamethasone (DECADRON) 12 mg in sodium chloride 0.9 % 145 mL IVPB, , Intravenous,  Once, 4 of 4 cycles Administration:  (05/22/2019),  (07/03/2019),  (07/24/2019),  (06/12/2019)  for chemotherapy treatment.    Adenocarcinoma of right lung, stage 4 ([place])  05/15/2019 Initial Diagnosis   Adenocarcinoma of right lung, stage 4 ([place])   05/22/2019 -  Chemotherapy   The patient had palonosetron (ALOXI) injection 0.25 mg, 0.25 mg, Intravenous,  Once, 4 of 4 cycles Administration: 0.25 mg (05/22/2019), 0.25 mg (07/03/2019), 0.25 mg (07/24/2019) PEMEtrexed (ALIMTA) 900 mg in sodium chloride 0.9 % 100 mL chemo infusion, 517 mg/m2 = 850 mg, Intravenous,  Once, 4 of 6 cycles Administration: 900 mg (05/22/2019), 900 mg (07/03/2019), 800 mg (07/24/2019), 900 mg (06/12/2019) CARBOplatin (PARAPLATIN) 520 mg in sodium chloride 0.9 % 250 mL chemo infusion, 520 mg (100 % of original dose 517.5 mg), Intravenous,  Once, 4 of 4 cycles Dose modification: 517.5 mg (original dose 517.5 mg, Cycle 1), 517.5 mg (original dose 517.5 mg, Cycle 3), 517.5 mg (original dose 517.5 mg, Cycle 4), 517.5 mg (original dose 517.5 mg,  Cycle 2) Administration: 520 mg (05/22/2019), 520 mg (07/03/2019), 520 mg (07/24/2019), 520 mg (06/12/2019) pembrolizumab (KEYTRUDA) 200 mg in sodium chloride 0.9 % 50 mL chemo infusion, 200 mg, Intravenous, Once, 4 of 6 cycles Administration: 200 mg (05/22/2019), 200 mg  (07/03/2019), 200 mg (07/24/2019), 200 mg (06/12/2019) fosaprepitant (EMEND) 150 mg, dexamethasone (DECADRON) 12 mg in sodium chloride 0.9 % 145 mL IVPB, , Intravenous,  Once, 4 of 4 cycles Administration:  (05/22/2019),  (07/03/2019),  (07/24/2019),  (06/12/2019)  for chemotherapy treatment.       REVIEW OF SYSTEMS:   Constitutional: Denies fevers, chills Respiratory: Denies cough, dyspnea or wheezes Cardiovascular: Denies palpitation, chest discomfort Gastrointestinal:  Denies nausea, heartburn or change in bowel habits Skin: Denies abnormal skin rashes Lymphatics: Denies new lymphadenopathy Neurological:Denies numbness, tingling or new weaknesses Behavioral/Psych: Mood is stable, no new changes  Extremities: No lower extremity edema All other systems were reviewed with the patient and are negative.  I have reviewed the past medical history, past surgical history, social history and family history with the patient and they are unchanged from previous note.   PHYSICAL EXAMINATION: ECOG PERFORMANCE STATUS: 2 - Symptomatic, <50% confined to bed  Vitals:   08/08/19 2010 08/09/19 0425  BP: 101/79 102/76  Pulse: 81 80  Resp: 18 20  Temp: 98.3 F (36.8 C) 98.5 F (36.9 C)  SpO2: 100% 100%   Filed Weights   08/03/19 1135 08/05/19 1336  Weight: 113 lb (51.3 kg) 116 lb (52.6 kg)    Intake/Output from previous day: 01/27 0701 - 01/28 0700 In: 700 [P.O.:600; IV Piggyback:100] Out: 7902 [Urine:1535]  GENERAL:Cachectic, no distress OROPHARYNX: Mucositis noted.  Has dried scabs to his lips, no active bleeding. LUNGS: clear to auscultation and percussion with normal breathing effort HEART: Regular rate and rhythm, no lower extremity edema ABDOMEN:abdomen soft, non-tender and normal bowel sounds Musculoskeletal:no cyanosis of digits and no clubbing  NEURO: alert & oriented x 3 with fluent speech, no focal motor/sensory deficits  LABORATORY DATA:  I have reviewed the data as  listed CMP Latest Ref Rng & Units 08/09/2019 08/08/2019 08/06/2019  Glucose 70 - 99 mg/dL 88 89 88  BUN 6 - 20 mg/dL 26(H) 24(H) 19  Creatinine 0.61 - 1.24 mg/dL 1.29(H) 1.08 1.14  Sodium 135 - 145 mmol/L 138 139 136  Potassium 3.5 - 5.1 mmol/L 3.9 2.8(L) 3.1(L)  Chloride 98 - 111 mmol/L 97(L) 96(L) 92(L)  CO2 22 - 32 mmol/L 32 33(H) 32  Calcium 8.9 - 10.3 mg/dL 8.0(L) 7.9(L) 8.1(L)  Total Protein 6.5 - 8.1 g/dL - - -  Total Bilirubin 0.3 - 1.2 mg/dL - - -  Alkaline Phos 38 - 126 U/L - - -  AST 15 - 41 U/L - - -  ALT 0 - 44 U/L - - -    Lab Results  Component Value Date   WBC 5.0 08/09/2019   HGB 8.1 (L) 08/09/2019   HCT 25.6 (L) 08/09/2019   MCV 87.7 08/09/2019   PLT 13 (LL) 08/09/2019   NEUTROABS 3.5 08/09/2019    CT Chest W Contrast  Result Date: 07/23/2019 CLINICAL DATA:  Stage IV right lower lobe lung adenocarcinoma diagnosed July 2020 status post concurrent chemoradiation therapy with ongoing chemotherapy and immunotherapy. Restaging. EXAM: CT CHEST, ABDOMEN, AND PELVIS WITH CONTRAST TECHNIQUE: Multidetector CT imaging of the chest, abdomen and pelvis was performed following the standard protocol during bolus administration of intravenous contrast. CONTRAST:  62mL OMNIPAQUE IOHEXOL 300 MG/ML  SOLN COMPARISON:  04/29/2019  chest CT.  12/27/2018 PET-CT. FINDINGS: CT CHEST FINDINGS Cardiovascular: Normal heart size. No significant pericardial effusion/thickening. Left circumflex and right coronary atherosclerosis. Mildly atherosclerotic nonaneurysmal thoracic aorta. Normal caliber pulmonary arteries. No central pulmonary emboli. Mediastinum/Nodes: No discrete thyroid nodules. Unremarkable esophagus. No pathologically enlarged axillary, mediastinal or hilar lymph nodes. Lungs/Pleura: No pneumothorax. Small loculated basilar right pleural effusion, slightly increased from 04/29/2019. Interval removal right pleural chest tube. No left pleural effusion. Mild centrilobular and paraseptal  emphysema. The previously visualized extensive consolidation and patchy ground-glass opacity throughout the left lung on 04/29/2019 chest CT has essentially resolved. Extensive irregular masslike consolidation throughout the right lower lobe measures up to 9.8 x 7.4 cm in maximum axial dimensions and is somewhat sharply marginated (series 4/image 101), increased from 8.0 x 5.8 cm on 04/29/2019. Evolving sharply marginated mild right perihilar radiation fibrosis. New cavitary 2.9 x 2.4 cm focus in the medial right lower lobe (series 4/image 106) with air-fluid level. Dominant 0.8 cm solid apical right upper lobe nodule (series 4/image 27), stable. Additional scattered subcentimeter indistinct sub solid nodules in both lungs are not appreciably changed. No definite new pulmonary nodules. Musculoskeletal: No aggressive appearing focal osseous lesions. Stable small bone island in T3 vertebral body. CT ABDOMEN PELVIS FINDINGS Hepatobiliary: Normal liver with no liver mass. Cholelithiasis. No biliary ductal dilatation. Pancreas: Normal, with no mass or duct dilation. Spleen: Normal size. No mass. Adrenals/Urinary Tract: Normal adrenals. No hydronephrosis. Heterogeneous enhancement of the renal parenchyma in both kidneys without discrete renal masses. Subcentimeter hypodense lower left renal cortical lesion is too small to characterize and unchanged since 12/27/2018 PET-CT, considered benign. Normal bladder. Stomach/Bowel: Normal non-distended stomach. Normal caliber small bowel with no small bowel wall thickening. Appendix not discretely visualized. Moderate sigmoid diverticulosis, with no large bowel wall thickening or significant pericolonic fat stranding. Oral contrast transits to the rectum. Vascular/Lymphatic: Atherosclerotic nonaneurysmal abdominal aorta. Patent portal, splenic, hepatic and renal veins. No pathologically enlarged lymph nodes in the abdomen or pelvis. Reproductive: Normal size prostate. Other: No  pneumoperitoneum. No focal fluid collection. Small volume pelvic ascites. Musculoskeletal: No aggressive appearing focal osseous lesions. Mild lumbar spondylosis. IMPRESSION: 1. Extensive irregular masslike consolidation replacing much of the right lower lobe, somewhat sharply marginated, mildly increased since 04/29/2019 chest CT, indeterminate for evolving radiation fibrosis versus viable tumor. New cavitary focus within the medial right lower lobe with air-fluid level. PET-CT could be considered for further evaluation versus close chest CT follow-up in 3 months. 2. Scattered ill-defined subcentimeter pulmonary nodules are not definitely changed. Previously visualized left lung opacities have largely resolved. 3. No pathologically enlarged thoracic nodes. No evidence of metastatic disease in the abdomen or pelvis. 4. Small loculated right pleural effusion. Interval removal of right chest tube. 5. Small volume pelvic ascites. 6. Nonspecific heterogeneous enhancement of the kidneys bilaterally. No hydronephrosis. Findings could represent medical renal disease or pyelonephritis. No evidence of renal abscess. 7. Aortic Atherosclerosis (ICD10-I70.0) and Emphysema (ICD10-J43.9). These results will be called to the ordering clinician or representative by the Radiologist Assistant, and communication documented in the PACS or zVision Dashboard. Electronically Signed   By: Ilona Sorrel M.D.   On: 07/23/2019 11:09   CT Abdomen Pelvis W Contrast  Result Date: 07/23/2019 CLINICAL DATA:  Stage IV right lower lobe lung adenocarcinoma diagnosed July 2020 status post concurrent chemoradiation therapy with ongoing chemotherapy and immunotherapy. Restaging. EXAM: CT CHEST, ABDOMEN, AND PELVIS WITH CONTRAST TECHNIQUE: Multidetector CT imaging of the chest, abdomen and pelvis was performed following the standard  protocol during bolus administration of intravenous contrast. CONTRAST:  48mL OMNIPAQUE IOHEXOL 300 MG/ML  SOLN  COMPARISON:  04/29/2019 chest CT.  12/27/2018 PET-CT. FINDINGS: CT CHEST FINDINGS Cardiovascular: Normal heart size. No significant pericardial effusion/thickening. Left circumflex and right coronary atherosclerosis. Mildly atherosclerotic nonaneurysmal thoracic aorta. Normal caliber pulmonary arteries. No central pulmonary emboli. Mediastinum/Nodes: No discrete thyroid nodules. Unremarkable esophagus. No pathologically enlarged axillary, mediastinal or hilar lymph nodes. Lungs/Pleura: No pneumothorax. Small loculated basilar right pleural effusion, slightly increased from 04/29/2019. Interval removal right pleural chest tube. No left pleural effusion. Mild centrilobular and paraseptal emphysema. The previously visualized extensive consolidation and patchy ground-glass opacity throughout the left lung on 04/29/2019 chest CT has essentially resolved. Extensive irregular masslike consolidation throughout the right lower lobe measures up to 9.8 x 7.4 cm in maximum axial dimensions and is somewhat sharply marginated (series 4/image 101), increased from 8.0 x 5.8 cm on 04/29/2019. Evolving sharply marginated mild right perihilar radiation fibrosis. New cavitary 2.9 x 2.4 cm focus in the medial right lower lobe (series 4/image 106) with air-fluid level. Dominant 0.8 cm solid apical right upper lobe nodule (series 4/image 27), stable. Additional scattered subcentimeter indistinct sub solid nodules in both lungs are not appreciably changed. No definite new pulmonary nodules. Musculoskeletal: No aggressive appearing focal osseous lesions. Stable small bone island in T3 vertebral body. CT ABDOMEN PELVIS FINDINGS Hepatobiliary: Normal liver with no liver mass. Cholelithiasis. No biliary ductal dilatation. Pancreas: Normal, with no mass or duct dilation. Spleen: Normal size. No mass. Adrenals/Urinary Tract: Normal adrenals. No hydronephrosis. Heterogeneous enhancement of the renal parenchyma in both kidneys without discrete  renal masses. Subcentimeter hypodense lower left renal cortical lesion is too small to characterize and unchanged since 12/27/2018 PET-CT, considered benign. Normal bladder. Stomach/Bowel: Normal non-distended stomach. Normal caliber small bowel with no small bowel wall thickening. Appendix not discretely visualized. Moderate sigmoid diverticulosis, with no large bowel wall thickening or significant pericolonic fat stranding. Oral contrast transits to the rectum. Vascular/Lymphatic: Atherosclerotic nonaneurysmal abdominal aorta. Patent portal, splenic, hepatic and renal veins. No pathologically enlarged lymph nodes in the abdomen or pelvis. Reproductive: Normal size prostate. Other: No pneumoperitoneum. No focal fluid collection. Small volume pelvic ascites. Musculoskeletal: No aggressive appearing focal osseous lesions. Mild lumbar spondylosis. IMPRESSION: 1. Extensive irregular masslike consolidation replacing much of the right lower lobe, somewhat sharply marginated, mildly increased since 04/29/2019 chest CT, indeterminate for evolving radiation fibrosis versus viable tumor. New cavitary focus within the medial right lower lobe with air-fluid level. PET-CT could be considered for further evaluation versus close chest CT follow-up in 3 months. 2. Scattered ill-defined subcentimeter pulmonary nodules are not definitely changed. Previously visualized left lung opacities have largely resolved. 3. No pathologically enlarged thoracic nodes. No evidence of metastatic disease in the abdomen or pelvis. 4. Small loculated right pleural effusion. Interval removal of right chest tube. 5. Small volume pelvic ascites. 6. Nonspecific heterogeneous enhancement of the kidneys bilaterally. No hydronephrosis. Findings could represent medical renal disease or pyelonephritis. No evidence of renal abscess. 7. Aortic Atherosclerosis (ICD10-I70.0) and Emphysema (ICD10-J43.9). These results will be called to the ordering clinician or  representative by the Radiologist Assistant, and communication documented in the PACS or zVision Dashboard. Electronically Signed   By: Ilona Sorrel M.D.   On: 07/23/2019 11:09   DG Chest Port 1 View  Result Date: 08/03/2019 CLINICAL DATA:  Shortness of breath.  Hypoxia. EXAM: PORTABLE CHEST 1 VIEW COMPARISON:  07/31/2019.  CT 07/23/2019. FINDINGS: Mediastinum hilar structures normal. Persistent atelectasis and  infiltrate right lung base. Persistent cavitary changes right lung base. Persistent right-sided pleural effusion. Similar findings noted on prior exam. Symmetric single nodular opacities noted over the chest bilaterally consistent nipple shadows. Stable cardiomegaly. No acute bony abnormality. IMPRESSION: Persistent right base atelectasis and infiltrate. Persistent cavitary changes right lung base. Persistent right-sided pleural effusion. No significant interim change from prior exam. Electronically Signed   By: [place]   On: 08/03/2019 11:22   DG Chest Portable 1 View  Result Date: 07/31/2019 CLINICAL DATA:  Shortness of breath EXAM: PORTABLE CHEST 1 VIEW COMPARISON:  April 28, 2019 FINDINGS: There is loculated effusion at the right base with atelectatic change in the right base. There is consolidation in the medial right base. Lungs elsewhere clear. Heart is upper normal in size with pulmonary vascularity normal. No adenopathy. No bone lesions. IMPRESSION: Loculated pleural effusion right base with consolidation medial right base and atelectatic change elsewhere in the right base. Left lung clear. Heart upper normal in size. Electronically Signed   By: Lowella Grip III M.D.   On: 07/31/2019 18:23    ASSESSMENT AND PLAN: 1. Stage IV(T1b, N2, M1a) non-small cell lung cancer, adenocarcinoma  2. Pancytopenia secondary to recent chemotherapy 3. Febrile neutropenia 4. Mucositis/? Esophageal candidiasis 5. Hypokalemia, resolved 6. Atrial fibrillation 7. History of PE 8.  Mild  dehydration  -Total white blood cell count and ANC are now normal.  Discontinue Granix. -Hemoglobin is stable today.  No PRBC transfusion indicated.  Transfuse for hemoglobin less than 8. -Platelet count is drifting down and is down to 13,000 today.  He is not actively bleeding.  Will hold off on platelet transfusion unless he develops active bleeding.  Repeat CBC in the morning and will consider platelet transfusion if his platelet counts less than 10,000. -He has been transitioned to oral antibiotics.  He remains afebrile.  Continue oral antibiotics per hospitalist. -Continue fluconazole, MMW, and viscous lidocaine. -Continue to hold Xarelto for now due to thrombocytopenia. Can resume Xarelto once platelet count >50K. -IV fluids per hospitalist.   LOS: 6 days   Mikey Bussing, DNP, AGPCNP-BC, AOCNP 08/09/19

## 2019-08-09 NOTE — Progress Notes (Signed)
PHARMACIST - PHYSICIAN COMMUNICATION  CONCERNING: Antibiotic IV to Oral Route Change Policy  RECOMMENDATION: This patient is receiving fluconazole by the intravenous route.  Based on criteria approved by the Pharmacy and Therapeutics Committee, the antibiotic(s) is/are being converted to the equivalent oral dose form(s).   DESCRIPTION: These criteria include:  Patient being treated for a respiratory tract infection, urinary tract infection, cellulitis or clostridium difficile associated diarrhea if on metronidazole  The patient is not neutropenic and does not exhibit a GI malabsorption state  The patient is eating (either orally or via tube) and/or has been taking other orally administered medications for a least 24 hours  The patient is improving clinically and has a Tmax < 100.5  If you have questions about this conversion, please contact the Pharmacy Department  []   [phone] )  [facility] []   [phone] )  [facility] []   [phone] )  [facility] []   [phone] )  [facility] [x]   [phone] )  [facility]

## 2019-08-10 LAB — BASIC METABOLIC PANEL WITH GFR
Anion gap: 8 (ref 5–15)
BUN: 22 mg/dL — ABNORMAL HIGH (ref 6–20)
CO2: 31 mmol/L (ref 22–32)
Calcium: 8.1 mg/dL — ABNORMAL LOW (ref 8.9–10.3)
Chloride: 100 mmol/L (ref 98–111)
Creatinine, Ser: 1.09 mg/dL (ref 0.61–1.24)
GFR calc Af Amer: >60 mL/min
GFR calc non Af Amer: >60 mL/min
Glucose, Bld: 87 mg/dL (ref 70–99)
Potassium: 3.7 mmol/L (ref 3.5–5.1)
Sodium: 139 mmol/L (ref 135–145)

## 2019-08-10 LAB — CBC WITH DIFFERENTIAL/PLATELET
Abs Immature Granulocytes: 0.79 K/uL — ABNORMAL HIGH (ref 0.00–0.07)
Basophils Absolute: 0 K/uL (ref 0.0–0.1)
Basophils Relative: 0 %
Eosinophils Absolute: 0.1 K/uL (ref 0.0–0.5)
Eosinophils Relative: 1 %
HCT: 26.9 % — ABNORMAL LOW (ref 39.0–52.0)
Hemoglobin: 8.1 g/dL — ABNORMAL LOW (ref 13.0–17.0)
Immature Granulocytes: 10 %
Lymphocytes Relative: 7 %
Lymphs Abs: 0.6 K/uL — ABNORMAL LOW (ref 0.7–4.0)
MCH: 26.9 pg (ref 26.0–34.0)
MCHC: 30.1 g/dL (ref 30.0–36.0)
MCV: 89.4 fL (ref 80.0–100.0)
Monocytes Absolute: 0.9 K/uL (ref 0.1–1.0)
Monocytes Relative: 11 %
Neutro Abs: 5.9 K/uL (ref 1.7–7.7)
Neutrophils Relative %: 71 %
Platelets: 10 K/uL — CL (ref 150–400)
RBC: 3.01 MIL/uL — ABNORMAL LOW (ref 4.22–5.81)
RDW: 17.8 % — ABNORMAL HIGH (ref 11.5–15.5)
WBC: 8.3 K/uL (ref 4.0–10.5)
nRBC: 0 % (ref 0.0–0.2)

## 2019-08-10 MED ORDER — MAGIC MOUTHWASH W/LIDOCAINE: 5.0000 mL | Freq: Four times a day (QID) | ORAL | 0 refills | Status: AC | PRN

## 2019-08-10 MED ORDER — SENNOSIDES-DOCUSATE SODIUM 8.6-50 MG PO TABS: 1.0000 | ORAL_TABLET | Freq: Every evening | ORAL | 0 refills | Status: AC | PRN

## 2019-08-10 MED ORDER — SODIUM CHLORIDE 0.9% IV SOLUTION: Freq: Once | INTRAVENOUS | Status: AC

## 2019-08-10 MED ORDER — AMOXICILLIN-POT CLAVULANATE 400-57 MG/5ML PO SUSR: 880.0000 mg | Freq: Two times a day (BID) | ORAL | 0 refills | Status: AC

## 2019-08-10 MED ORDER — FLUCONAZOLE 200 MG PO TABS: 200.0000 mg | ORAL_TABLET | Freq: Every day | ORAL | 0 refills | Status: AC

## 2019-08-10 MED ORDER — LIDOCAINE VISCOUS HCL 2 % MT SOLN: 15.0000 mL | OROMUCOSAL | 0 refills | Status: AC | PRN

## 2019-08-10 MED ORDER — OXYCODONE-ACETAMINOPHEN 5-325 MG PO TABS: 1.0000 | ORAL_TABLET | Freq: Four times a day (QID) | ORAL | 0 refills | Status: DC | PRN

## 2019-08-10 MED ORDER — METOPROLOL TARTRATE 25 MG PO TABS: 25.0000 mg | ORAL_TABLET | Freq: Two times a day (BID) | ORAL | 0 refills | Status: AC

## 2019-08-10 NOTE — TOC Progression Note (Signed)
Transition of Care [facility]) - Progression Note    Patient Details  Name: Tyler [name] MRN: [id number] Date of Birth: 1961/01/10  Transition of Care [facility]) CM/SW Contact  Purcell Mouton, RN Phone Number: 08/10/2019, 4:09 PM  Clinical Narrative:    [name] will follow pt at home with [facility] and [employer] for O2. Pt was made aware.         Expected Discharge Plan and Services           Expected Discharge Date: 08/10/19                                     Social Determinants of Health (SDOH) Interventions    Readmission Risk Interventions No flowsheet data found.

## 2019-08-10 NOTE — Progress Notes (Signed)
Brief oncology note:  Labs from today have been reviewed.  White blood cell count is 8.3.  Hemoglobin is stable 8.1.  White count is down to 10,000.  He is not actively bleeding.  Discussed with hospitalist.  Transfuse 1 unit of platelets today.  He may be discharged home if otherwise medically stable.  Continue to hold his Xarelto and we can discuss resuming it once his platelet count is above 50,000.  He continues to have pain with swallowing.  Our dietitian will see him at his follow-up visit week make further recommendations.  He is scheduled to follow-up with [place] on 08/14/2019 and he will keep this appointment.  We will recheck his labs on that date.  Mikey Bussing, DNP, AGPCNP-BC, AOCNP

## 2019-08-10 NOTE — Discharge Summary (Signed)
Physician Discharge Summary  TERRACE FONTANILLA HQR:[id number] DOB: [birth year] DOA: 08/03/2019  PCP: Brand Males, MD  Admit date: 08/03/2019 Discharge date: 08/10/2019  Admitted From: Home Discharge disposition: Home with home health, PT, social worker   Code Status: Full Code  Diet Recommendation: As tolerated   Recommendations for Outpatient Follow-Up:   1. Follow-up with PCP as an outpatient 2. Follow-up with oncology  Discharge Diagnosis:   Principal Problem:   Acute respiratory failure with hypoxia (HCC) Active Problems:   Protein-calorie malnutrition, severe   Adenocarcinoma of right lung, stage 3 (HCC)   Febrile neutropenia (HCC)   Persistent atrial fibrillation (HCC)   History of pulmonary embolism   History of Present Illness / Brief narrative:  Javone Ybanez Garrettis a 59 y.o.malewith medical history significant ofpermanent atrial fibrillation, stage IV non-small cell lung cancer/adenocarcinoma on current chemotherapy, history of pulmonary embolism, EtOH abuse, tobacco abuse. On 1/22, patient was sent to the ED from oncology clinic where he presented with cough, fever, progressive shortness of breath, progressively worsening for 2 to 3 days with associated dizziness,fatigue, weakness,especially when standing.   In the ED, patient was hypoxic and required supplemental oxygen at 2 L/min. Work-up showed WBC count 0.4, hemoglobin 7.7, platelet 21.  Rapid Covid-19negative.  Chest x-ray with persistent right base atelectasis versus infiltrate with right pleural effusion.   Patient was started on vancomycin, cefepime, Flagyl for neutropenic fever.  Admitted to hospitalist service for further evaluation and management  Hospital Course:  Acute respiratory failure with hypoxia Right-sided pneumonia Neutropenic fever -Presented with fever, progressively worsening shortness of breath. Right base infiltrate was noted in chest x-ray. -Started on broad-spectrum  antibiotic coverage with vancomycin, cefepime and Flagyl.  -Culture reports negative so far. -Antibiotics have been deescalated to oral Augmentin.  Continue for next 7 days to complete 2 weeks course. -Remains on oxygen supplementation. Qualifies for home oxygen.  Stage IV adenocarcinoma right lung -Patient is followed up by medical oncology Dr. [name]. He is still on chemotherapy.  -Oncology consult appreciated.  Patient apparently wants to continue aggressive care and is not interested in palliative/hospice. -Patient will follow-up with oncology as an outpatient.  Symptomatic anemia. Pancytopenia -This is likely from active chemotherapy. Patient received Granix in the hospital.. -Also received 2 units of PRBC on 1/22 and 1 unit of platelets on 1/25. -Labs from this morning shows improving WBC count 8.3, stable hemoglobin 8.1 but platelet count is dropping, down to 10 today.  I discussed with oncology team today.  We will keep transfuse 1 unit of platelets today prior to discharge.   -Patient has an appointment at oncology clinic on Tuesday 2/2.  Acute kidney injury -Creatinine normal at baseline. Grain elevated to 1.29 yesterday.  Adequately hydrated.  Back down to normal today.  Odynophagia -Mucositis versus esophageal candidiasis  -Continue fluconazole, Magic mouthwash and viscous lidocaine.   -Encourage oral appetite.  -If patient's symptoms do not improve and he continues to have poor appetite, poor hydration and at risk of deterioration, he may need a PEG tube placement.  I have discussed this with oncology team.  This will be addressed during his appointment with them next week.  Apparently patient also has an appoint with dietitian next week.  Permanent atrial fibrillation -Continue metoprolol 25 mg twice daily -Continue to hold Xarelto 20 mg daily due to severely low platelet count.  I would not resume Xarelto at discharge.  Hxpulmonary embolism Continue to hold  Xarelto 20 mg daily due to thrombocytopenia.  I would not resume Xarelto at discharge.  Prognosis -overall prognosis poor.   Goals of care discussed by hospitalist team, oncology team as well as palliative care.  Patient would like to continue aggressive care at this time.    Malnutrition - Body mass index is 14.89 kg/m.    Subjective:  Seen and examined this morning.  Middle-aged African-American male, looks older than his age.  Remains weak, oxygen dependent.  Somewhat able to eat with viscous lidocaine.  Discharge Exam:   Vitals:   08/09/19 0425 08/09/19 2044 08/10/19 0525 08/10/19 1145  BP: 102/76 109/81 113/83 113/83  Pulse: 80 89 80 77  Resp: 20 18 18    Temp: 98.5 F (36.9 C) 97.8 F (36.6 C) 97.8 F (36.6 C)   TempSrc: Oral Oral Oral   SpO2: 100% 100% 98%   Weight:      Height:        Body mass index is 14.89 kg/m.  General exam: Appears calm and comfortable.  No acute distress Skin: No rashes, lesions or ulcers. HEENT: Atraumatic, normocephalic, supple neck, no obvious bleeding.  No thrush noted. Lungs: Clear to auscultation bilaterally except for mild bibasilar Rales CVS: Regular rate and rhythm, no murmur GI/Abd soft, nontender, nondistended, bowel sounds CNS: Alert, awake, oriented x3 Psychiatry: Mood appropriate Extremities: No pedal edema, no calf tenderness  Discharge Instructions:  Wound care: None Discharge Instructions    Call MD for:  severe uncontrolled pain   Complete by: As directed    Call MD for:  temperature >100.4   Complete by: As directed    Diet general   Complete by: As directed    Increase activity slowly   Complete by: As directed      Follow-up Information    Brand Males, MD Follow up.   Specialty: Pulmonary Disease Contact information: [place] [address] [phone]        [name], MD Follow up.   Specialty: Oncology Contact information: [place] [address] [phone]          Allergies as of 08/10/2019   No Known Allergies     Medication List    STOP taking these medications   ciprofloxacin 500 MG tablet Commonly known as: Cipro   polyethylene glycol 17 g packet Commonly known as: MIRALAX / GLYCOLAX   prochlorperazine 10 MG tablet Commonly known as: COMPAZINE   tiotropium 18 MCG inhalation capsule Commonly known as: Spiriva HandiHaler     TAKE these medications   albuterol 108 (90 Base) MCG/ACT inhaler Commonly known as: VENTOLIN HFA Inhale 2 puffs into the lungs every 6 (six) hours as needed for wheezing or shortness of breath. What changed: Another medication with the same name was removed. Continue taking this medication, and follow the directions you see here.   amoxicillin-clavulanate 400-57 MG/5ML suspension Commonly known as: AUGMENTIN Take 11 mLs (880 mg total) by mouth every 12 (twelve) hours for 5 days.   feeding supplement (ENSURE ENLIVE) Liqd Take 237 mLs by mouth 3 (three) times daily between meals. What changed: when to take this   fluconazole 200 MG tablet Commonly known as: DIFLUCAN Take 1 tablet (200 mg total) by mouth daily for 7 days. Start taking on: August 10, 8086   folic acid 1 MG tablet Commonly known as: FOLVITE Take 1 tablet (1 mg total) by mouth daily.   lidocaine 2 % solution Commonly known as: XYLOCAINE Use as directed 15 mLs  in the mouth or throat every 4 (four) hours as needed for up to 7 days for mouth pain. What changed:   how much to take  how to take this  when to take this  reasons to take this  additional instructions   magic mouthwash Soln Take 5 mLs by mouth 4 (four) times daily as needed for mouth pain.   magic mouthwash w/lidocaine Soln Take 5 mLs by mouth 4 (four) times daily as needed for up to 7 days for mouth pain.   magnesium oxide 400 (241.3 Mg) MG tablet Commonly known as: MAG-OX Take 1 tablet (400 mg total) by mouth 2  (two) times daily.   metoprolol tartrate 25 MG tablet Commonly known as: LOPRESSOR Take 1 tablet (25 mg total) by mouth 2 (two) times daily.   oxyCODONE-acetaminophen 5-325 MG tablet Commonly known as: PERCOCET/ROXICET Take 1 tablet by mouth every 6 (six) hours as needed for up to 5 days for moderate pain or severe pain. What changed:   when to take this  reasons to take this   potassium chloride SA 20 MEQ tablet Commonly known as: KLOR-CON Take 1 tablet (20 mEq total) by mouth 2 (two) times daily.   rivaroxaban 20 MG Tabs tablet Commonly known as: Xarelto Take 1 tablet (20 mg total) by mouth daily with supper.   senna-docusate 8.6-50 MG tablet Commonly known as: Senokot-S Take 1 tablet by mouth at bedtime as needed for mild constipation. What changed:   how much to take  when to take this  reasons to take this            Durable Medical Equipment  (From admission, onward)         Start     Ordered   08/10/19 1214  DME Oxygen  Once    Question Answer Comment  Length of Need 6 Months   Mode or (Route) Nasal cannula   Liters per Minute 3   Frequency Continuous (stationary and portable oxygen unit needed)   Oxygen conserving device Yes   Oxygen delivery system Gas      08/10/19 1215          Time coordinating discharge: 35 minutes  The results of significant diagnostics from this hospitalization (including imaging, microbiology, ancillary and laboratory) are listed below for reference.    Procedures and Diagnostic Studies:   DG Chest Port 1 View  Result Date: 08/03/2019 CLINICAL DATA:  Shortness of breath.  Hypoxia. EXAM: PORTABLE CHEST 1 VIEW COMPARISON:  07/31/2019.  CT 07/23/2019. FINDINGS: Mediastinum hilar structures normal. Persistent atelectasis and infiltrate right lung base. Persistent cavitary changes right lung base. Persistent right-sided pleural effusion. Similar findings noted on prior exam. Symmetric single nodular opacities noted over  the chest bilaterally consistent nipple shadows. Stable cardiomegaly. No acute bony abnormality. IMPRESSION: Persistent right base atelectasis and infiltrate. Persistent cavitary changes right lung base. Persistent right-sided pleural effusion. No significant interim change from prior exam. Electronically Signed   By: Hankinson   On: 08/03/2019 11:22     Labs:   Basic Metabolic Panel: Recent Labs  Lab 08/04/19 0247 08/04/19 0247 08/05/19 0251 08/05/19 0251 08/06/19 0160 08/06/19 1093 08/08/19 0256 08/08/19 0256 08/09/19 0250 08/10/19 0327  NA 131*   < > 137  --  136  --  139  --  138 139  K 3.0*   < > 2.6*   < > 3.1*   < > 2.8*   < > 3.9 3.7  CL  92*   < > 96*  --  92*  --  96*  --  97* 100  CO2 27   < > 30  --  32  --  33*  --  32 31  GLUCOSE 82   < > 95  --  88  --  89  --  88 87  BUN 18   < > 20  --  19  --  24*  --  26* 22*  CREATININE 1.17   < > 1.06  --  1.14  --  1.08  --  1.29* 1.09  CALCIUM 7.0*   < > 7.7*  --  8.1*  --  7.9*  --  8.0* 8.1*  MG 0.9*  --  2.0  --   --   --   --   --   --   --    < > = values in this interval not displayed.   GFR Estimated Creatinine Clearance: 53.6 mL/min (by C-G formula based on SCr of 1.09 mg/dL). Liver Function Tests: No results for input(s): AST, ALT, ALKPHOS, BILITOT, PROT, ALBUMIN in the last 168 hours. No results for input(s): LIPASE, AMYLASE in the last 168 hours. No results for input(s): AMMONIA in the last 168 hours. Coagulation profile No results for input(s): INR, PROTIME in the last 168 hours.  CBC: Recent Labs  Lab 08/06/19 1417 08/07/19 0412 08/08/19 0256 08/09/19 0250 08/10/19 0327  WBC 0.5* 0.9* 2.2* 5.0 8.3  NEUTROABS 0.1* 0.4* 1.3* 3.5 5.9  HGB 9.0* 8.5* 8.0* 8.1* 8.1*  HCT 28.1* 26.2* 25.1* 25.6* 26.9*  MCV 85.2 84.8 86.0 87.7 89.4  PLT 44* 32* 19* 13* 10*   Cardiac Enzymes: No results for input(s): CKTOTAL, CKMB, CKMBINDEX, TROPONINI in the last 168 hours. BNP: Invalid input(s):  POCBNP CBG: No results for input(s): GLUCAP in the last 168 hours. D-Dimer No results for input(s): DDIMER in the last 72 hours. Hgb A1c No results for input(s): HGBA1C in the last 72 hours. Lipid Profile No results for input(s): CHOL, HDL, LDLCALC, TRIG, CHOLHDL, LDLDIRECT in the last 72 hours. Thyroid function studies No results for input(s): TSH, T4TOTAL, T3FREE, THYROIDAB in the last 72 hours.  Invalid input(s): FREET3 Anemia work up No results for input(s): VITAMINB12, FOLATE, FERRITIN, TIBC, IRON, RETICCTPCT in the last 72 hours. Microbiology Recent Results (from the past 240 hour(s))  Urine culture     Status: None   Collection Time: 08/03/19 11:12 AM   Specimen: Urine, Clean Catch  Result Value Ref Range Status   Specimen Description   Final    URINE, CLEAN CATCH Performed at [facility], [place] [address] [place], [place] [address]    Special Requests   Final    NONE Performed at [facility], [place] [address] [place], [place] [address]    Culture   Final    NO GROWTH Performed at [facility], [place] [address], [place], [place] [address]    Report Status 08/05/2019 FINAL  Final  Blood culture (routine x 2)     Status: None   Collection Time: 08/03/19 12:00 PM   Specimen: BLOOD LEFT HAND  Result Value Ref Range Status   Specimen Description   Final    BLOOD LEFT HAND Performed at [facility] [address] [place], [place] [address]    Special Requests   Final    BOTTLES DRAWN AEROBIC AND ANAEROBIC Blood Culture results may not  be optimal due to an inadequate volume of blood received in culture bottles Performed at [facility] [address], [place], [place] [address]    Culture   Final    NO GROWTH 5 DAYS Performed at [facility], [place] [address], [place], [place] [address]    Report Status 08/08/2019 FINAL  Final  Blood culture (routine x 2)     Status: None    Collection Time: 08/03/19 12:39 PM   Specimen: BLOOD  Result Value Ref Range Status   Specimen Description   Final    BLOOD LEFT ARM Performed at [facility] [address] [place], [place] [address]    Special Requests   Final    BOTTLES DRAWN AEROBIC AND ANAEROBIC Blood Culture results may not be optimal due to an inadequate volume of blood received in culture bottles Performed at [facility] [address] [place], [place] [address]    Culture   Final    NO GROWTH 5 DAYS Performed at [facility], [place] [address] [place], [place] [address]    Report Status 08/08/2019 FINAL  Final  MRSA PCR Screening     Status: None   Collection Time: 08/03/19  3:24 PM   Specimen: Nasal Mucosa; Nasopharyngeal  Result Value Ref Range Status   MRSA by PCR NEGATIVE NEGATIVE Final    Comment:        The GeneXpert MRSA Assay (FDA approved for NASAL specimens only), is one component of a comprehensive MRSA colonization surveillance program. It is not intended to diagnose MRSA infection nor to guide or monitor treatment for MRSA infections. Performed at [facility], [place] [address], [place], [place] [address]   SARS CORONAVIRUS 2 (TAT 6-24 HRS) Nasopharyngeal Nasopharyngeal Swab     Status: None   Collection Time: 08/03/19  3:37 PM   Specimen: Nasopharyngeal Swab  Result Value Ref Range Status   SARS Coronavirus 2 NEGATIVE NEGATIVE Final    Comment: (NOTE) SARS-CoV-2 target nucleic acids are NOT DETECTED. The SARS-CoV-2 RNA is generally detectable in upper and lower respiratory specimens during the acute phase of infection. Negative results do not preclude SARS-CoV-2 infection, do not rule out co-infections with other pathogens, and should not be used as the sole basis for treatment or other patient management decisions. Negative results must be combined with clinical observations, patient history, and epidemiological  information. The expected result is Negative. Fact Sheet for Patients: SugarRoll.be Fact Sheet for Healthcare Providers: https://www.woods-mathews.com/ This test is not yet approved or cleared by the [place] FDA and  has been authorized for detection and/or diagnosis of SARS-CoV-2 by FDA under an Emergency Use Authorization (EUA). This EUA will remain  in effect (meaning this test can be used) for the duration of the COVID-19 declaration under Section 56 4(b)(1) of the Act, 21 U.S.C. section 360bbb-3(b)(1), unless the authorization is terminated or revoked sooner. Performed at [facility], [place] [address], [place], [place] [address]     Please note: You were cared for by a hospitalist during your hospital stay. Once you are discharged, your primary care physician will handle any further medical issues. Please note that NO REFILLS for any discharge medications will be authorized once you are discharged, as it is imperative that you return to your primary care physician (or establish a relationship with a primary care physician if you do not have one) for your post hospital discharge needs so  that they can reassess your need for medications and monitor your lab values.  Signed: Terrilee Croak  [facility] 08/10/2019, 12:15 PM

## 2019-08-10 NOTE — Progress Notes (Signed)
Patient oxygen level reduces to 83% on RA. Patient maintains oxygen saturation of >90% on 2L of oxygen.

## 2019-08-11 ENCOUNTER — Telehealth: Payer: Self-pay | Admitting: Hematology

## 2019-08-11 ENCOUNTER — Inpatient Hospital Stay (HOSPITAL_COMMUNITY)
Admission: EM | Admit: 2019-08-11 | Discharge: 2019-08-14 | DRG: 809 | Disposition: A | Discharge status: Hospice | Payer: Medicaid Other | Attending: Internal Medicine | Admitting: Internal Medicine

## 2019-08-11 ENCOUNTER — Encounter (HOSPITAL_COMMUNITY): Payer: Self-pay | Admitting: Emergency Medicine

## 2019-08-11 ENCOUNTER — Emergency Department (HOSPITAL_COMMUNITY): Payer: Medicaid Other

## 2019-08-11 DIAGNOSIS — Z7901 Long term (current) use of anticoagulants: Secondary | ICD-10-CM

## 2019-08-11 DIAGNOSIS — D696 Thrombocytopenia, unspecified: Secondary | ICD-10-CM | POA: Diagnosis present

## 2019-08-11 DIAGNOSIS — J449 Chronic obstructive pulmonary disease, unspecified: Secondary | ICD-10-CM | POA: Diagnosis present

## 2019-08-11 DIAGNOSIS — Z8249 Family history of ischemic heart disease and other diseases of the circulatory system: Secondary | ICD-10-CM

## 2019-08-11 DIAGNOSIS — R042 Hemoptysis: Secondary | ICD-10-CM | POA: Diagnosis present

## 2019-08-11 DIAGNOSIS — Z20822 Contact with and (suspected) exposure to covid-19: Secondary | ICD-10-CM | POA: Diagnosis present

## 2019-08-11 DIAGNOSIS — G8929 Other chronic pain: Secondary | ICD-10-CM | POA: Diagnosis present

## 2019-08-11 DIAGNOSIS — C3491 Malignant neoplasm of unspecified part of right bronchus or lung: Secondary | ICD-10-CM | POA: Diagnosis present

## 2019-08-11 DIAGNOSIS — M549 Dorsalgia, unspecified: Secondary | ICD-10-CM | POA: Diagnosis present

## 2019-08-11 DIAGNOSIS — D649 Anemia, unspecified: Secondary | ICD-10-CM | POA: Diagnosis present

## 2019-08-11 DIAGNOSIS — Z79899 Other long term (current) drug therapy: Secondary | ICD-10-CM

## 2019-08-11 DIAGNOSIS — R1310 Dysphagia, unspecified: Secondary | ICD-10-CM

## 2019-08-11 DIAGNOSIS — I10 Essential (primary) hypertension: Secondary | ICD-10-CM | POA: Diagnosis present

## 2019-08-11 DIAGNOSIS — D6959 Other secondary thrombocytopenia: Secondary | ICD-10-CM | POA: Diagnosis present

## 2019-08-11 DIAGNOSIS — Z7189 Other specified counseling: Secondary | ICD-10-CM

## 2019-08-11 DIAGNOSIS — Z681 Body mass index (BMI) 19 or less, adult: Secondary | ICD-10-CM

## 2019-08-11 DIAGNOSIS — Z8701 Personal history of pneumonia (recurrent): Secondary | ICD-10-CM

## 2019-08-11 DIAGNOSIS — E440 Moderate protein-calorie malnutrition: Secondary | ICD-10-CM | POA: Diagnosis present

## 2019-08-11 DIAGNOSIS — R05 Cough: Secondary | ICD-10-CM | POA: Diagnosis not present

## 2019-08-11 DIAGNOSIS — Z923 Personal history of irradiation: Secondary | ICD-10-CM

## 2019-08-11 DIAGNOSIS — D61818 Other pancytopenia: Principal | ICD-10-CM | POA: Diagnosis present

## 2019-08-11 DIAGNOSIS — F17210 Nicotine dependence, cigarettes, uncomplicated: Secondary | ICD-10-CM | POA: Diagnosis present

## 2019-08-11 DIAGNOSIS — Z9221 Personal history of antineoplastic chemotherapy: Secondary | ICD-10-CM

## 2019-08-11 DIAGNOSIS — D6481 Anemia due to antineoplastic chemotherapy: Secondary | ICD-10-CM | POA: Diagnosis present

## 2019-08-11 DIAGNOSIS — Z66 Do not resuscitate: Secondary | ICD-10-CM | POA: Diagnosis present

## 2019-08-11 DIAGNOSIS — C3490 Malignant neoplasm of unspecified part of unspecified bronchus or lung: Secondary | ICD-10-CM

## 2019-08-11 DIAGNOSIS — I4821 Permanent atrial fibrillation: Secondary | ICD-10-CM | POA: Diagnosis present

## 2019-08-11 DIAGNOSIS — T451X5A Adverse effect of antineoplastic and immunosuppressive drugs, initial encounter: Secondary | ICD-10-CM | POA: Diagnosis present

## 2019-08-11 DIAGNOSIS — Z86711 Personal history of pulmonary embolism: Secondary | ICD-10-CM

## 2019-08-11 DIAGNOSIS — Z515 Encounter for palliative care: Secondary | ICD-10-CM

## 2019-08-11 LAB — CBC WITH DIFFERENTIAL/PLATELET
Abs Immature Granulocytes: 0.48 10*3/uL — ABNORMAL HIGH (ref 0.00–0.07)
Basophils Absolute: 0 10*3/uL (ref 0.0–0.1)
Basophils Relative: 0 %
Eosinophils Absolute: 0.1 10*3/uL (ref 0.0–0.5)
Eosinophils Relative: 1 %
HCT: 23.7 % — ABNORMAL LOW (ref 39.0–52.0)
Hemoglobin: 7.4 g/dL — ABNORMAL LOW (ref 13.0–17.0)
Immature Granulocytes: 5 %
Lymphocytes Relative: 10 %
Lymphs Abs: 0.9 10*3/uL (ref 0.7–4.0)
MCH: 27.3 pg (ref 26.0–34.0)
MCHC: 31.2 g/dL (ref 30.0–36.0)
MCV: 87.5 fL (ref 80.0–100.0)
Monocytes Absolute: 1.7 10*3/uL — ABNORMAL HIGH (ref 0.1–1.0)
Monocytes Relative: 19 %
Neutro Abs: 5.9 10*3/uL (ref 1.7–7.7)
Neutrophils Relative %: 65 %
Platelets: 11 10*3/uL — CL (ref 150–400)
RBC: 2.71 MIL/uL — ABNORMAL LOW (ref 4.22–5.81)
RDW: 18.2 % — ABNORMAL HIGH (ref 11.5–15.5)
WBC: 9.1 10*3/uL (ref 4.0–10.5)
nRBC: 0 % (ref 0.0–0.2)

## 2019-08-11 LAB — BASIC METABOLIC PANEL
Anion gap: 10 (ref 5–15)
BUN: 19 mg/dL (ref 6–20)
CO2: 31 mmol/L (ref 22–32)
Calcium: 7.9 mg/dL — ABNORMAL LOW (ref 8.9–10.3)
Chloride: 99 mmol/L (ref 98–111)
Creatinine, Ser: 1.11 mg/dL (ref 0.61–1.24)
GFR calc Af Amer: >60 mL/min (ref >60)
GFR calc non Af Amer: >60 mL/min (ref >60)
Glucose, Bld: 82 mg/dL (ref 70–99)
Potassium: 3.5 mmol/L (ref 3.5–5.1)
Sodium: 140 mmol/L (ref 135–145)

## 2019-08-11 LAB — PREPARE PLATELET PHERESIS: Unit division: 0

## 2019-08-11 LAB — BPAM PLATELET PHERESIS
Blood Product Expiration Date: 202101302359
ISSUE DATE / TIME: 202101291340
Unit Type and Rh: 6200

## 2019-08-11 LAB — TROPONIN I (HIGH SENSITIVITY): Troponin I (High Sensitivity): 40 ng/L — ABNORMAL HIGH (ref <18)

## 2019-08-11 LAB — PREPARE RBC (CROSSMATCH)

## 2019-08-11 MED ORDER — MAGIC MOUTHWASH
5.0000 mL | Freq: Four times a day (QID) | ORAL | Status: DC | PRN
  Filled 2019-08-11: qty 5

## 2019-08-11 MED ORDER — VANCOMYCIN HCL IN DEXTROSE 1-5 GM/200ML-% IV SOLN
1000.0000 mg | Freq: Once | INTRAVENOUS | Status: AC
  Administered 2019-08-11: 1000 mg via INTRAVENOUS
  Filled 2019-08-11: qty 200

## 2019-08-11 MED ORDER — ALBUTEROL SULFATE HFA 108 (90 BASE) MCG/ACT IN AERS: 2.0000 | INHALATION_SPRAY | Freq: Four times a day (QID) | RESPIRATORY_TRACT | Status: DC | PRN

## 2019-08-11 MED ORDER — METOPROLOL TARTRATE 25 MG PO TABS
25.0000 mg | ORAL_TABLET | Freq: Two times a day (BID) | ORAL | Status: DC
  Administered 2019-08-11 – 2019-08-14 (×6): 25 mg via ORAL
  Filled 2019-08-11 (×6): qty 1

## 2019-08-11 MED ORDER — ACETAMINOPHEN 650 MG RE SUPP: 650.0000 mg | Freq: Four times a day (QID) | RECTAL | Status: DC | PRN

## 2019-08-11 MED ORDER — SODIUM CHLORIDE 0.9 % IV BOLUS
500.0000 mL | Freq: Once | INTRAVENOUS | Status: AC
  Administered 2019-08-11: 500 mL via INTRAVENOUS

## 2019-08-11 MED ORDER — IOHEXOL 350 MG/ML SOLN
100.0000 mL | Freq: Once | INTRAVENOUS | Status: AC | PRN
  Administered 2019-08-11: 80 mL via INTRAVENOUS

## 2019-08-11 MED ORDER — FOLIC ACID 1 MG PO TABS
1.0000 mg | ORAL_TABLET | Freq: Every day | ORAL | Status: DC
  Administered 2019-08-12 – 2019-08-14 (×3): 1 mg via ORAL
  Filled 2019-08-11 (×3): qty 1

## 2019-08-11 MED ORDER — SODIUM CHLORIDE 0.9 % IV SOLN: 10.0000 mL/h | Freq: Once | INTRAVENOUS | Status: DC

## 2019-08-11 MED ORDER — ALBUTEROL SULFATE (2.5 MG/3ML) 0.083% IN NEBU: 2.5000 mg | INHALATION_SOLUTION | Freq: Four times a day (QID) | RESPIRATORY_TRACT | Status: DC | PRN

## 2019-08-11 MED ORDER — METOPROLOL TARTRATE 5 MG/5ML IV SOLN
5.0000 mg | Freq: Once | INTRAVENOUS | Status: AC
  Administered 2019-08-11: 5 mg via INTRAVENOUS
  Filled 2019-08-11: qty 5

## 2019-08-11 MED ORDER — PIPERACILLIN-TAZOBACTAM 3.375 G IVPB
3.3750 g | Freq: Three times a day (TID) | INTRAVENOUS | Status: DC
  Administered 2019-08-12 – 2019-08-14 (×8): 3.375 g via INTRAVENOUS
  Filled 2019-08-11 (×8): qty 50

## 2019-08-11 MED ORDER — POTASSIUM CHLORIDE CRYS ER 20 MEQ PO TBCR
20.0000 meq | EXTENDED_RELEASE_TABLET | Freq: Two times a day (BID) | ORAL | Status: DC
  Administered 2019-08-11 – 2019-08-14 (×6): 20 meq via ORAL
  Filled 2019-08-11 (×6): qty 1

## 2019-08-11 MED ORDER — ENSURE ENLIVE PO LIQD
237.0000 mL | Freq: Every day | ORAL | Status: DC
  Administered 2019-08-13 – 2019-08-14 (×2): 237 mL via ORAL

## 2019-08-11 MED ORDER — MAGNESIUM OXIDE 400 (241.3 MG) MG PO TABS
400.0000 mg | ORAL_TABLET | Freq: Two times a day (BID) | ORAL | Status: DC
  Administered 2019-08-11 – 2019-08-14 (×6): 400 mg via ORAL
  Filled 2019-08-11 (×6): qty 1

## 2019-08-11 MED ORDER — SENNOSIDES-DOCUSATE SODIUM 8.6-50 MG PO TABS: 1.0000 | ORAL_TABLET | Freq: Every evening | ORAL | Status: DC | PRN

## 2019-08-11 MED ORDER — FLUCONAZOLE 100 MG PO TABS
200.0000 mg | ORAL_TABLET | Freq: Every day | ORAL | Status: DC
  Administered 2019-08-12 – 2019-08-14 (×3): 200 mg via ORAL
  Filled 2019-08-11 (×3): qty 2

## 2019-08-11 MED ORDER — SODIUM CHLORIDE (PF) 0.9 % IJ SOLN
INTRAMUSCULAR | Status: AC
  Filled 2019-08-11: qty 50

## 2019-08-11 MED ORDER — SODIUM CHLORIDE 0.9 % IV SOLN: 10.0000 mL/h | Freq: Once | INTRAVENOUS | Status: AC

## 2019-08-11 MED ORDER — ACETAMINOPHEN 325 MG PO TABS
650.0000 mg | ORAL_TABLET | Freq: Four times a day (QID) | ORAL | Status: DC | PRN
  Administered 2019-08-11 – 2019-08-12 (×2): 650 mg via ORAL
  Filled 2019-08-11 (×2): qty 2

## 2019-08-11 NOTE — Telephone Encounter (Signed)
Ok. Thank you. He just left the hospital a day ago.

## 2019-08-11 NOTE — ED Notes (Signed)
ED TO INPATIENT HANDOFF REPORT  Name/Age/Gender Tyler [name] 59 y.o. male  Code Status    Code Status Orders  (From admission, onward)         Start     Ordered   08/11/19 2032  Full code  Continuous     08/11/19 2033        Code Status History    Date Active Date Inactive Code Status Order ID Comments User Context   08/03/2019 1523 08/10/2019 2303 Full Code [id number]  [place] Eric J, DO ED   04/28/2019 1134 05/02/2019 2056 DNR [id number]  Garner Nash, DO Inpatient   04/19/2019 1832 04/28/2019 1134 Full Code [id number]  Nita Sells, MD ED   04/07/2019 0320 04/10/2019 1813 Full Code [id number]  Gwynne Edinger, MD ED   08/04/2017 0135 08/11/2017 1543 Full Code [id number]  Jani Gravel, MD Inpatient   Advance Care Planning Activity      Home/SNF/Other Home  Chief Complaint Hemoptysis [R04.2]  Level of Care/Admitting Diagnosis ED Disposition    ED Disposition Condition [place]: [place] [[id number]]  Level of Care: Telemetry [5]  Admit to tele based on following criteria: Monitor for Ischemic changes  Covid Evaluation: Asymptomatic Screening Protocol (No Symptoms)  Diagnosis: Hemoptysis [735329]  Admitting Physician: Jani Gravel [[id number]]  Attending Physician: Jani Gravel [phone]       Medical History Past Medical History:  Diagnosis Date  . Alcohol abuse   . Chronic back pain   . COPD (chronic obstructive pulmonary disease) ([place])   . Dyspnea   . Lung mass    mediastinal adenopathy  . Lung nodule   . Multiple pulmonary nodules 04/02/2018  . Pneumonia   . Right lower lobe pulmonary nodule 04/02/2018   12/01/2017: PET SUV 2.1  . Tobacco abuse     Allergies No Known Allergies  IV Location/Drains/Wounds Patient Lines/Drains/Airways Status   Active Line/Drains/Airways    Name:   Placement date:   Placement time:   Site:   Days:   Peripheral IV 08/11/19 Right Antecubital   08/11/19    2026    Antecubital    less than 1   Wound / Incision (Open or Dehisced) Vertebral column Upper;Medial radiation burn   --    --    Vertebral column             Labs/Imaging Results for orders placed or performed during the hospital encounter of 08/11/19 (from the past 48 hour(s))  Basic metabolic panel     Status: Abnormal   Collection Time: 08/11/19  5:03 PM  Result Value Ref Range   Sodium 140 135 - 145 mmol/L   Potassium 3.5 3.5 - 5.1 mmol/L   Chloride 99 98 - 111 mmol/L   CO2 31 22 - 32 mmol/L   Glucose, Bld 82 70 - 99 mg/dL   BUN 19 6 - 20 mg/dL   Creatinine, Ser 1.11 0.61 - 1.24 mg/dL   Calcium 7.9 (L) 8.9 - 10.3 mg/dL   GFR calc non Af Amer >60 >60 mL/min   GFR calc Af Amer >60 >60 mL/min   Anion gap 10 5 - 15    Comment: Performed at [facility], [place] [address] [place], [place] [address]  CBC with Differential     Status: Abnormal   Collection Time: 08/11/19  5:03 PM  Result Value Ref Range   WBC 9.1 4.0 - 10.5 K/uL   RBC 2.71 (  L) 4.22 - 5.81 MIL/uL   Hemoglobin 7.4 (L) 13.0 - 17.0 g/dL   HCT 23.7 (L) 39.0 - 52.0 %   MCV 87.5 80.0 - 100.0 fL   MCH 27.3 26.0 - 34.0 pg   MCHC 31.2 30.0 - 36.0 g/dL   RDW 18.2 (H) 11.5 - 15.5 %   Platelets 11 (LL) 150 - 400 K/uL    Comment: Immature Platelet Fraction may be clinically indicated, consider ordering this additional test [id number] THIS CRITICAL RESULT HAS VERIFIED AND BEEN CALLED TO [facility],G BY KENNEDY JACKSON ON 01 30 2021 AT 1719, AND HAS BEEN READ BACK. CRITICAL RESULT VERIFIED    nRBC 0.0 0.0 - 0.2 %   Neutrophils Relative % 65 %   Neutro Abs 5.9 1.7 - 7.7 K/uL   Lymphocytes Relative 10 %   Lymphs Abs 0.9 0.7 - 4.0 K/uL   Monocytes Relative 19 %   Monocytes Absolute 1.7 (H) 0.1 - 1.0 K/uL   Eosinophils Relative 1 %   Eosinophils Absolute 0.1 0.0 - 0.5 K/uL   Basophils Relative 0 %   Basophils Absolute 0.0 0.0 - 0.1 K/uL   WBC Morphology DOHLE BODIES     Comment: MODERATE LEFT SHIFT (>5% METAS AND MYELOS,OCC  PRO NOTED) TOXIC GRANULATION    Immature Granulocytes 5 %   Abs Immature Granulocytes 0.48 (H) 0.00 - 0.07 K/uL    Comment: Performed at [facility], [place] [address] [place], [place] [address]  Type and screen [place]     Status: None (Preliminary result)   Collection Time: 08/11/19  7:59 PM  Result Value Ref Range   ABO/RH(D) B POS    Antibody Screen NEG    Sample Expiration      08/14/2019,2359 Performed at [facility], [address] [place], [place] [address]    Unit Number [id number]    Blood Component Type RED CELLS,LR    Unit division 00    Status of Unit ALLOCATED    Transfusion Status OK TO TRANSFUSE    Crossmatch Result Compatible    Unit Number [id number]    Blood Component Type RED CELLS,LR    Unit division 00    Status of Unit ALLOCATED    Transfusion Status OK TO TRANSFUSE    Crossmatch Result Compatible   Prepare RBC     Status: None   Collection Time: 08/11/19  7:59 PM  Result Value Ref Range   Order Confirmation      ORDER PROCESSED BY BLOOD BANK Performed at [facility], [place] [address], [place], [place] [address]    CT Angio Chest PE W and/or Wo Contrast  Result Date: 08/11/2019 CLINICAL DATA:  Hemoptysis. Dizziness. History of lung cancer. Concern for pulmonary embolism. Known right lower lobe stage IV adenocarcinoma, status post chemotherapy and radiation therapy in July 2020. EXAM: CT ANGIOGRAPHY CHEST WITH CONTRAST TECHNIQUE: Multidetector CT imaging of the chest was performed using the standard protocol during bolus administration of intravenous contrast. Multiplanar CT image reconstructions and MIPs were obtained to evaluate the vascular anatomy. CONTRAST:  85mL OMNIPAQUE IOHEXOL 350 MG/ML SOLN COMPARISON:  08/11/2019 chest radiograph.  CT chest 07/23/2019. FINDINGS: Cardiovascular: The quality of this exam for evaluation of pulmonary embolism is good. No evidence of  pulmonary embolism. Aortic and branch vessel atherosclerosis. Tortuous thoracic aorta. Moderate cardiomegaly, without pericardial effusion. Left circumflex coronary artery atherosclerosis. Mediastinum/Nodes: Suggestion of soft tissue fullness within the inferior mediastinum, including within the subcarinal  space on 79/4. This extends into the peribronchovascular soft tissues surrounding the right lower lobe bronchi, including on 96/4. No well-defined adenopathy. Lungs/Pleura: Similar small right pleural effusion. New trace left pleural fluid. Similar mass-effect upon right middle and right lower lobe bronchi. Biapical pleuroparenchymal scarring. Pulmonary nodules, within example 7 mm right apical nodule on 51/10 measuring similar on the prior CT. Redemonstration of right lower lung consolidation, ground-glass, and airspace disease. This is slightly increased, including within the posterolateral right middle and anterolateral right lower lobes. New left lower lobe airspace and ground-glass opacity. Underlying centrilobular and paraseptal emphysema. Upper Abdomen: Possible trace perihepatic ascites. Normal imaged portions of the spleen, stomach, pancreas, adrenal glands, kidneys. Musculoskeletal: No acute osseous abnormality. Review of the MIP images confirms the above findings. IMPRESSION: 1.  No evidence of pulmonary embolism. 2. Worsened right lower lung aeration in the setting of consolidation, airspace, and ground-glass opacity. New left lower lobe consolidation and airspace disease. Presuming no recent radiation therapy, considerations include progressive infection/aspiration or tumor in this patient with a history of stage IV adenocarcinoma. 3. Developing soft tissue fullness within the inferior mediastinum and right infrahilar region. Similarly, this could be radiation induced or represent tumor spread. No well-defined adenopathy. 4. Similar small right and development of trace left pleural fluid. 5. Aortic  atherosclerosis (ICD10-I70.0), coronary artery atherosclerosis and emphysema (ICD10-J43.9). 6. Pulmonary nodules, as before. 7. Consider 6-12 week follow-up CT, possibly after antibiotic therapy. Electronically Signed   By: Abigail Miyamoto M.D.   On: 08/11/2019 18:35   DG Chest Portable 1 View  Result Date: 08/11/2019 CLINICAL DATA:  Cough. History of lung cancer and COPD. Pneumonia. EXAM: PORTABLE CHEST 1 VIEW COMPARISON:  August 03, 2019 FINDINGS: Effusion and opacity remains in the right lower lung, mildly worsened in the interval. The left lung is clear. No pneumothorax. No change in the cardiomediastinal silhouette. IMPRESSION: Mild worsening of right-sided effusion and underlying opacity. Recommend follow-up to resolution. Electronically Signed   By: Dorise Bullion III M.D   On: 08/11/2019 16:09    Pending Labs Unresulted Labs (From admission, onward)    Start     Ordered   08/12/19 0500  Comprehensive metabolic panel  Tomorrow morning,   R    Question:  Release to patient  Answer:  Immediate   08/11/19 2033   08/12/19 0500  CBC  Tomorrow morning,   R    Question:  Release to patient  Answer:  Immediate   08/11/19 2033   08/11/19 2040  Prepare Pheresed Platelets  (Adult Blood Adminstration - Platelets (Pheresed))  Once,   R    Question Answer Comment  Number of Apheresis Units 1 unit (6-10 packs)   Transfusion Indications PLT Count </=10,000/mm   Date and time of surgery N/A      08/11/19 2040   08/11/19 2033  APTT  Add-on,   AD    Question:  Release to patient  Answer:  Immediate   08/11/19 2033   08/11/19 2033  Protime-INR  Add-on,   AD    Question:  Release to patient  Answer:  Immediate   08/11/19 2033   08/11/19 1946  SARS CORONAVIRUS 2 (TAT 6-24 HRS) Nasopharyngeal Nasopharyngeal Swab  (Tier 3 (TAT 6-24 hrs))  Once,   STAT    Question Answer Comment  Is this test for diagnosis or screening Screening   Symptomatic for COVID-19 as defined by CDC No   Hospitalized for  COVID-19 No   Admitted to ICU for  COVID-19 No   Previously tested for COVID-19 Yes   Resident in a congregate (group) care setting No   Employed in healthcare setting No      08/11/19 1945          Vitals/Pain Today's Vitals   08/11/19 2000 08/11/19 2030 08/11/19 2100 08/11/19 2130  BP: (!) 125/95 112/83 114/75 124/86  Pulse: (!) 123 (!) 106 (!) 110 94  Resp: 16 16 17 16   Temp:      TempSrc:      SpO2: 100% 99% 100% 100%    Isolation Precautions No active isolations  Medications Medications  sodium chloride (PF) 0.9 % injection (has no administration in time range)  0.9 %  sodium chloride infusion (0 mL/hr Intravenous Hold 08/11/19 2026)  acetaminophen (TYLENOL) tablet 650 mg (has no administration in time range)    Or  acetaminophen (TYLENOL) suppository 650 mg (has no administration in time range)  0.9 %  sodium chloride infusion (has no administration in time range)  sodium chloride 0.9 % bolus 500 mL (0 mLs Intravenous Stopped 08/11/19 1948)  iohexol (OMNIPAQUE) 350 MG/ML injection 100 mL (80 mLs Intravenous Contrast Given 08/11/19 1755)  metoprolol tartrate (LOPRESSOR) injection 5 mg (5 mg Intravenous Given 08/11/19 1955)    Mobility walks

## 2019-08-11 NOTE — Telephone Encounter (Signed)
I received a phone call from our answering service, pt had hemoptysis this morning, at least a cup of blood, and feeling dizzy. Pt has metastatic NSCLC, on chemo. I recommend pt going to ED as soon as possible to be evaluated.   Tyler [name]  08/11/2019

## 2019-08-11 NOTE — ED Triage Notes (Addendum)
Per EMS, patient reports bloody sputum with cough since this morning. 2L  at baseline. Discharged yesterday. Hx lung cancer. Ambulatory.

## 2019-08-11 NOTE — ED Provider Notes (Signed)
[facility] DEPT Provider Note   CSN: [id number] Arrival date & time: 08/11/19  1338     History Chief Complaint  Patient presents with   Hemoptysis    Tyler [name] is a 59 y.o. male with PMH/o COPD, metastatic lung cancer (currently receiving chemo), Afib (on Xarelto) who presents for evaluation of hemoptysis that began today. He reports that he has been coughing for awhile now and that nothing he does helps with the cough. He reports that today he was coughing up blood. He reports that he was coughing up phlegm that was tinged with blood and had epistaxis with clots. This occurred multiple times throughout the day. He called the oncologist office who advised him to go the ER for further evaluation. He is on 2L O2 at baseline and has not had to increase it.  He has not had any SOB or CP. He was recently admitted for neutropenic fever.  He denies any chest pain, abdominal pain, vomiting.  He states he has been taking Xarelto.  The history is provided by the patient.       Past Medical History:  Diagnosis Date   Alcohol abuse    Chronic back pain    COPD (chronic obstructive pulmonary disease) (HCC)    Dyspnea    Lung mass    mediastinal adenopathy   Lung nodule    Multiple pulmonary nodules 04/02/2018   Pneumonia    Right lower lobe pulmonary nodule 04/02/2018   12/01/2017: PET SUV 2.1   Tobacco abuse     Patient Active Problem List   Diagnosis Date Noted   Hemoptysis 08/11/2019   Thrombocytopenia ([place]) 08/11/2019   Febrile neutropenia (HCC) 08/03/2019   Persistent atrial fibrillation ([place]) 08/03/2019   History of pulmonary embolism 08/03/2019   Mucositis 07/31/2019   Adenocarcinoma of right lung, stage 4 ([place]) 05/15/2019   Encounter for antineoplastic immunotherapy 05/15/2019   Malignant neoplasm of right lung (HCC)    AF (paroxysmal atrial fibrillation) ([place]) 04/27/2019   Atrial fibrillation with RVR ([place])  04/25/2019   Malnutrition of moderate degree 04/21/2019   Aspiration pneumonia ([place]) 04/19/2019   PNA (pneumonia) 04/19/2019   Loculated pleural effusion    Acute pulmonary embolism ([place]) 04/07/2019   Pulmonary embolism ([place]) 04/06/2019   Anemia 04/06/2019   Hypokalemia 04/06/2019   Malignant neoplasm of bronchus of right lower lobe ([place]) 03/01/2019   Adenocarcinoma of right lung, stage 3 ([place]) 01/17/2019   Goals of care, counseling/discussion 01/17/2019   Encounter for antineoplastic chemotherapy 01/17/2019   Tobacco abuse 01/10/2019   Mediastinal adenopathy 01/10/2019   Right upper lobe pulmonary nodule 04/19/2018   Multiple pulmonary nodules 04/02/2018   Right lower lobe pulmonary nodule 04/02/2018   Protein-calorie malnutrition, severe 08/09/2017   Acute lung injury    Pneumonitis    BOOP (bronchiolitis obliterans with organizing pneumonia) ([place])    Acute respiratory failure with hypoxia ([place]) 08/08/2017   Hypoxia 08/07/2017   Dehydration    Diarrhea    CAP (community acquired pneumonia) 08/04/2017   Hyponatremia 08/04/2017   Tachycardia 08/04/2017   Influenza A     Past Surgical History:  Procedure Laterality Date   APPENDECTOMY     VIDEO BRONCHOSCOPY WITH ENDOBRONCHIAL NAVIGATION N/A 04/05/2018   Procedure: VIDEO BRONCHOSCOPY WITH ENDOBRONCHIAL NAVIGATION;  Surgeon: Garner Nash, DO;  Location: [facility];  Service: Thoracic;  Laterality: N/A;   VIDEO BRONCHOSCOPY WITH ENDOBRONCHIAL NAVIGATION N/A 01/11/2019   Procedure: VIDEO BRONCHOSCOPY WITH ENDOBRONCHIAL NAVIGATION;  Surgeon: Garner Nash, DO;  Location: [facility] OR;  Service: Thoracic;  Laterality: N/A;   VIDEO BRONCHOSCOPY WITH ENDOBRONCHIAL ULTRASOUND N/A 01/11/2019   Procedure: VIDEO BRONCHOSCOPY WITH ENDOBRONCHIAL ULTRASOUND;  Surgeon: Garner Nash, DO;  Location: [facility] OR;  Service: Thoracic;  Laterality: N/A;   VIDEO BRONCHOSCOPY WITH RADIAL ENDOBRONCHIAL ULTRASOUND N/A 01/11/2019    Procedure: VIDEO BRONCHOSCOPY WITH RADIAL ENDOBRONCHIAL ULTRASOUND;  Surgeon: Garner Nash, DO;  Location: [facility] OR;  Service: Thoracic;  Laterality: N/A;   WISDOM TOOTH EXTRACTION         Family History  Problem Relation Age of Onset   Heart attack Maternal Grandfather     Social History   Tobacco Use   Smoking status: Current Every Day Smoker    Packs/day: 0.25    Years: 40.00    Pack years: 10.00    Types: Cigarettes   Smokeless tobacco: Never Used  Substance Use Topics   Alcohol use: Not Currently   Drug use: Not Currently    Types: Marijuana    Home Medications Prior to Admission medications   Medication Sig Start Date End Date Taking? Authorizing Provider  metoprolol tartrate (LOPRESSOR) 25 MG tablet Take 1 tablet (25 mg total) by mouth 2 (two) times daily. 08/10/19 09/09/19 Yes Dahal, Marlowe Aschoff, MD  rivaroxaban (XARELTO) 20 MG TABS tablet Take 1 tablet (20 mg total) by mouth daily with supper. 07/26/19  Yes Heilingoetter, Cassandra L, PA-C  albuterol (PROVENTIL HFA;VENTOLIN HFA) 108 (90 Base) MCG/ACT inhaler Inhale 2 puffs into the lungs every 6 (six) hours as needed for wheezing or shortness of breath. 06/27/18   Icard, Leory Plowman L, DO  amoxicillin-clavulanate (AUGMENTIN) 400-57 MG/5ML suspension Take 11 mLs (880 mg total) by mouth every 12 (twelve) hours for 5 days. 08/10/19 08/15/19  Terrilee Croak, MD  feeding supplement, ENSURE ENLIVE, (ENSURE ENLIVE) LIQD Take 237 mLs by mouth 3 (three) times daily between meals. Patient taking differently: Take 237 mLs by mouth daily.  08/11/17   Ghimire, Henreitta Leber, MD  fluconazole (DIFLUCAN) 200 MG tablet Take 1 tablet (200 mg total) by mouth daily for 7 days. 08/11/19 08/18/19  Terrilee Croak, MD  folic acid (FOLVITE) 1 MG tablet Take 1 tablet (1 mg total) by mouth daily. 05/15/19   [name], MD  lidocaine (XYLOCAINE) 2 % solution Use as directed 15 mLs in the mouth or throat every 4 (four) hours as needed for up to 7 days for mouth  pain. 08/10/19 08/17/19  Terrilee Croak, MD  magic mouthwash SOLN Take 5 mLs by mouth 4 (four) times daily as needed for mouth pain. 07/31/19   Heilingoetter, Cassandra L, PA-C  magic mouthwash w/lidocaine SOLN Take 5 mLs by mouth 4 (four) times daily as needed for up to 7 days for mouth pain. 08/10/19 08/17/19  Terrilee Croak, MD  magnesium oxide (MAG-OX) 400 (241.3 Mg) MG tablet Take 1 tablet (400 mg total) by mouth 2 (two) times daily. 05/15/19   [name], MD  oxyCODONE-acetaminophen (PERCOCET/ROXICET) 5-325 MG tablet Take 1 tablet by mouth every 6 (six) hours as needed for up to 5 days for moderate pain or severe pain. 08/10/19 08/15/19  Terrilee Croak, MD  potassium chloride SA (KLOR-CON) 20 MEQ tablet Take 1 tablet (20 mEq total) by mouth 2 (two) times daily. 07/31/19   Heilingoetter, Cassandra L, PA-C  senna-docusate (SENOKOT-S) 8.6-50 MG tablet Take 1 tablet by mouth at bedtime as needed for mild constipation. 08/10/19 09/09/19  Terrilee Croak, MD    Allergies  Patient has no known allergies.  Review of Systems   Review of Systems  Constitutional: Negative for fever.  Respiratory: Positive for cough. Negative for shortness of breath.   Cardiovascular: Negative for chest pain.  Gastrointestinal: Negative for abdominal pain, nausea and vomiting.  Genitourinary: Negative for hematuria.  Neurological: Negative for headaches.  All other systems reviewed and are negative.   Physical Exam Updated Vital Signs BP 124/86    Pulse 94    Temp 98 F (36.7 C) (Oral)    Resp 16    SpO2 100%   Physical Exam Vitals and nursing note reviewed.  Constitutional:      Appearance: Normal appearance. He is well-developed.  HENT:     Head: Normocephalic and atraumatic.     Nose:     Comments: No current epistaxis. Eyes:     General: Lids are normal.     Conjunctiva/sclera: Conjunctivae normal.     Pupils: Pupils are equal, round, and reactive to light.  Cardiovascular:     Rate and Rhythm: Regular  rhythm. Tachycardia present.     Pulses: Normal pulses.     Heart sounds: Normal heart sounds. No murmur. No friction rub. No gallop.   Pulmonary:     Effort: Pulmonary effort is normal.     Breath sounds: Normal breath sounds.     Comments: Diffuse rhonchi.  No evidence of respiratory distress. Abdominal:     Palpations: Abdomen is soft. Abdomen is not rigid.     Tenderness: There is no abdominal tenderness. There is no guarding.     Comments: Abdomen is soft, non-distended, non-tender. No rigidity, No guarding. No peritoneal signs.  Musculoskeletal:        General: Normal range of motion.     Cervical back: Full passive range of motion without pain.  Skin:    General: Skin is warm and dry.     Capillary Refill: Capillary refill takes less than 2 seconds.  Neurological:     Mental Status: He is alert and oriented to person, place, and time.  Psychiatric:        Speech: Speech normal.     ED Results / Procedures / Treatments   Labs (all labs ordered are listed, but only abnormal results are displayed) Labs Reviewed  BASIC METABOLIC PANEL - Abnormal; Notable for the following components:      Result Value   Calcium 7.9 (*)    All other components within normal limits  CBC WITH DIFFERENTIAL/PLATELET - Abnormal; Notable for the following components:   RBC 2.71 (*)    Hemoglobin 7.4 (*)    HCT 23.7 (*)    RDW 18.2 (*)    Platelets 11 (*)    Monocytes Absolute 1.7 (*)    Abs Immature Granulocytes 0.48 (*)    All other components within normal limits  SARS CORONAVIRUS 2 (TAT 6-24 HRS)  COMPREHENSIVE METABOLIC PANEL  CBC  APTT  PROTIME-INR  TYPE AND SCREEN  PREPARE RBC (CROSSMATCH)  PREPARE PLATELET PHERESIS  TROPONIN I (HIGH SENSITIVITY)    EKG None  Radiology CT Angio Chest PE W and/or Wo Contrast  Result Date: 08/11/2019 CLINICAL DATA:  Hemoptysis. Dizziness. History of lung cancer. Concern for pulmonary embolism. Known right lower lobe stage IV adenocarcinoma,  status post chemotherapy and radiation therapy in July 2020. EXAM: CT ANGIOGRAPHY CHEST WITH CONTRAST TECHNIQUE: Multidetector CT imaging of the chest was performed using the standard protocol during bolus administration of intravenous contrast. Multiplanar CT image reconstructions  and MIPs were obtained to evaluate the vascular anatomy. CONTRAST:  33mL OMNIPAQUE IOHEXOL 350 MG/ML SOLN COMPARISON:  08/11/2019 chest radiograph.  CT chest 07/23/2019. FINDINGS: Cardiovascular: The quality of this exam for evaluation of pulmonary embolism is good. No evidence of pulmonary embolism. Aortic and branch vessel atherosclerosis. Tortuous thoracic aorta. Moderate cardiomegaly, without pericardial effusion. Left circumflex coronary artery atherosclerosis. Mediastinum/Nodes: Suggestion of soft tissue fullness within the inferior mediastinum, including within the subcarinal space on 79/4. This extends into the peribronchovascular soft tissues surrounding the right lower lobe bronchi, including on 96/4. No well-defined adenopathy. Lungs/Pleura: Similar small right pleural effusion. New trace left pleural fluid. Similar mass-effect upon right middle and right lower lobe bronchi. Biapical pleuroparenchymal scarring. Pulmonary nodules, within example 7 mm right apical nodule on 51/10 measuring similar on the prior CT. Redemonstration of right lower lung consolidation, ground-glass, and airspace disease. This is slightly increased, including within the posterolateral right middle and anterolateral right lower lobes. New left lower lobe airspace and ground-glass opacity. Underlying centrilobular and paraseptal emphysema. Upper Abdomen: Possible trace perihepatic ascites. Normal imaged portions of the spleen, stomach, pancreas, adrenal glands, kidneys. Musculoskeletal: No acute osseous abnormality. Review of the MIP images confirms the above findings. IMPRESSION: 1.  No evidence of pulmonary embolism. 2. Worsened right lower lung  aeration in the setting of consolidation, airspace, and ground-glass opacity. New left lower lobe consolidation and airspace disease. Presuming no recent radiation therapy, considerations include progressive infection/aspiration or tumor in this patient with a history of stage IV adenocarcinoma. 3. Developing soft tissue fullness within the inferior mediastinum and right infrahilar region. Similarly, this could be radiation induced or represent tumor spread. No well-defined adenopathy. 4. Similar small right and development of trace left pleural fluid. 5. Aortic atherosclerosis (ICD10-I70.0), coronary artery atherosclerosis and emphysema (ICD10-J43.9). 6. Pulmonary nodules, as before. 7. Consider 6-12 week follow-up CT, possibly after antibiotic therapy. Electronically Signed   By: Abigail Miyamoto M.D.   On: 08/11/2019 18:35   DG Chest Portable 1 View  Result Date: 08/11/2019 CLINICAL DATA:  Cough. History of lung cancer and COPD. Pneumonia. EXAM: PORTABLE CHEST 1 VIEW COMPARISON:  August 03, 2019 FINDINGS: Effusion and opacity remains in the right lower lung, mildly worsened in the interval. The left lung is clear. No pneumothorax. No change in the cardiomediastinal silhouette. IMPRESSION: Mild worsening of right-sided effusion and underlying opacity. Recommend follow-up to resolution. Electronically Signed   By: Dorise Bullion III M.D   On: 08/11/2019 16:09    Procedures Procedures (including critical care time)  Medications Ordered in ED Medications  sodium chloride (PF) 0.9 % injection (has no administration in time range)  0.9 %  sodium chloride infusion (0 mL/hr Intravenous Hold 08/11/19 2026)  acetaminophen (TYLENOL) tablet 650 mg (has no administration in time range)    Or  acetaminophen (TYLENOL) suppository 650 mg (has no administration in time range)  0.9 %  sodium chloride infusion (has no administration in time range)  sodium chloride 0.9 % bolus 500 mL (0 mLs Intravenous Stopped 08/11/19  1948)  iohexol (OMNIPAQUE) 350 MG/ML injection 100 mL (80 mLs Intravenous Contrast Given 08/11/19 1755)  metoprolol tartrate (LOPRESSOR) injection 5 mg (5 mg Intravenous Given 08/11/19 1955)    ED Course  I have reviewed the triage vital signs and the nursing notes.  Pertinent labs & imaging results that were available during my care of the patient were reviewed by me and considered in my medical decision making (see chart for details).  MDM Rules/Calculators/A&P                      59 year old male possible history of lung cancer, A. fib currently on Xarelto who presents for evaluation of hemoptysis.  Reports he has been coughing for a long time and states that today, he started coughing up some blood.  He states that the sputum was blood-tinged.  He was also having some epistaxis.  That has since resolved.  He had 1 prior episode in the waiting room.  No difficulty with breathing.  No increase in his oxygen requirement.  On initial arrival, he is afebrile, appears uncomfortable but no acute distress.  He is tachycardic.  Vitals otherwise stable.  Plan tocheck CXR,  Labs.  BMP shows no acute normalities in BUN and creatinine.  CBC shows no leukocytosis.  Hemoglobin is 7.4, platelets are 18.  He had blood work done yesterday that showed a CBC with hemoglobin of 8.1, platelets of 10.  Right-sided effusion underlying opacity.  CT shows no evidence of PE.  He has worsened right lower lung aeration worsening consolidation airspace and ground opacity.  He also has new left lower lobe consolidation.  He has some developing soft tissue fullness within the inferior mediastinum in the right infrahilar region.  Could represent tumor spread versus being radiation-induced.  Reevaluation.  Patient states he has continued cough.  He has had some phlegm mild blood-tinged pain.  No gross hemoptysis no worsening like it was.  Will discuss with oncology.  Discussed patient with Dr. Burr Medico (Oncology).  We  discussed at length with patient.  Given that his platelets are low and his hemoglobin has dropped and that he had had active bleeding today, recommends admission for observation with plans to transfuse, platelets.  Oncology will consult tomorrow.  Discussed patient with Dr. Maudie Mercury (Hospitalist). He accepts patient for admission.   Portions of this note were generated with [employer]. Dictation errors may occur despite best attempts at proofreading.   Final Clinical Impression(s) / ED Diagnoses Final diagnoses:  Hemoptysis  Anemia, unspecified type  Thrombocytopenia ([place])    Rx / DC Orders ED Discharge Orders    None      Portions of this note were generated with Dragon dictation software. Dictation errors may occur despite best attempts at proofreading.    Volanda Napoleon, PA-C 08/11/19 [place], [place], DO 08/11/19 2310

## 2019-08-11 NOTE — H&P (Addendum)
TRH H&P    Patient Demographics:    Tyler [name], is a 59 y.o. male  MRN: [id number]  DOB - 1960/07/25  Admit Date - 08/11/2019  Referring MD/NP/PA:  Simonne Martinet  Outpatient Primary MD for the patient is [name], MD  Patient coming from:  home  Chief complaint- hemoptysis   HPI:    Tyler [name]  is a 59 y.o. male,  w permanent Afib, mild to moderate MR, Etoh abuse, Tobacco abuse, Copd,  h/o PE, stage 4 nonsmall cell lung cancer (adenocarcinoma), pancytopenia,  w recent admission for pneumonia/ neutropenic fever 08/03/19, presents for hemoptysis.  Pt states started today.  Pt also has cough w yellow sputum. Pt notes slight achy chest pain with cough.  He has chronic right sided chest pain due to his lung cancer.  Pt denies fever, chills, sob beyond baseline, n/v, diarrhea, brbpr, black stool.    In ED,  T 98 P 82 Bp 117/80 pox 100% 2L [place]  CTA chest IMPRESSION: 1.  No evidence of pulmonary embolism. 2. Worsened right lower lung aeration in the setting of consolidation, airspace, and ground-glass opacity. New left lower lobe consolidation and airspace disease. Presuming no recent radiation therapy, considerations include progressive infection/aspiration or tumor in this patient with a history of stage IV adenocarcinoma. 3. Developing soft tissue fullness within the inferior mediastinum and right infrahilar region. Similarly, this could be radiation induced or represent tumor spread. No well-defined adenopathy. 4. Similar small right and development of trace left pleural fluid.  5. Aortic atherosclerosis (ICD10-I70.0), coronary artery atherosclerosis and emphysema (ICD10-J43.9). 6. Pulmonary nodules, as before. 7. Consider 6-12 week follow-up CT, possibly after antibiotic therapy.  Wbc 9.1, Hgb 7.4 , Plt 11 (prev 10) Na 140, K 3.5, Bun 19, Creatinine 1.11  ED spoke with oncology, who  recommended admission per ED, and transfusion of 1 units platelets and ?2 units prbc that have been ordered by ED.   Pt will be admitted for hemoptysis and anemia/ thrombocytopenia    Review of systems:    In addition to the HPI above,  No Fever-chills, No Headache, No changes with Vision or hearing, No problems swallowing food or Liquids,   No Abdominal pain, No Nausea or Vomiting, bowel movements are regular, No Blood in stool or Urine, No dysuria, No new skin rashes or bruises, No new joints pains-aches,  No new weakness, tingling, numbness in any extremity, No recent weight gain or loss, No polyuria, polydypsia or polyphagia, No significant Mental Stressors.  All other systems reviewed and are negative.    Past History of the following :    Past Medical History:  Diagnosis Date  . Alcohol abuse   . Chronic back pain   . COPD (chronic obstructive pulmonary disease) ([place])   . Dyspnea   . Lung mass    mediastinal adenopathy  . Lung nodule   . Multiple pulmonary nodules 04/02/2018  . Pneumonia   . Right lower lobe pulmonary nodule 04/02/2018   12/01/2017: PET SUV 2.1  . Tobacco abuse  Past Surgical History:  Procedure Laterality Date  . APPENDECTOMY    . VIDEO BRONCHOSCOPY WITH ENDOBRONCHIAL NAVIGATION N/A 04/05/2018   Procedure: VIDEO BRONCHOSCOPY WITH ENDOBRONCHIAL NAVIGATION;  Surgeon: Garner Nash, DO;  Location: [facility];  Service: Thoracic;  Laterality: N/A;  . VIDEO BRONCHOSCOPY WITH ENDOBRONCHIAL NAVIGATION N/A 01/11/2019   Procedure: VIDEO BRONCHOSCOPY WITH ENDOBRONCHIAL NAVIGATION;  Surgeon: Garner Nash, DO;  Location: [place];  Service: Thoracic;  Laterality: N/A;  . VIDEO BRONCHOSCOPY WITH ENDOBRONCHIAL ULTRASOUND N/A 01/11/2019   Procedure: VIDEO BRONCHOSCOPY WITH ENDOBRONCHIAL ULTRASOUND;  Surgeon: Garner Nash, DO;  Location: [facility];  Service: Thoracic;  Laterality: N/A;  . VIDEO BRONCHOSCOPY WITH RADIAL ENDOBRONCHIAL ULTRASOUND N/A 01/11/2019    Procedure: VIDEO BRONCHOSCOPY WITH RADIAL ENDOBRONCHIAL ULTRASOUND;  Surgeon: Garner Nash, DO;  Location: [facility];  Service: Thoracic;  Laterality: N/A;  . WISDOM TOOTH EXTRACTION        Social History:      Social History   Tobacco Use  . Smoking status: Current Every Day Smoker    Packs/day: 0.25    Years: 40.00    Pack years: 10.00    Types: Cigarettes  . Smokeless tobacco: Never Used  Substance Use Topics  . Alcohol use: Not Currently       Family History :     Family History  Problem Relation Age of Onset  . Heart attack Maternal Grandfather        Home Medications:   Prior to Admission medications   Medication Sig Start Date End Date Taking? Authorizing Provider  metoprolol tartrate (LOPRESSOR) 25 MG tablet Take 1 tablet (25 mg total) by mouth 2 (two) times daily. 08/10/19 09/09/19 Yes Dahal, Marlowe Aschoff, MD  rivaroxaban (XARELTO) 20 MG TABS tablet Take 1 tablet (20 mg total) by mouth daily with supper. 07/26/19  Yes Heilingoetter, Cassandra L, PA-C  albuterol (PROVENTIL HFA;VENTOLIN HFA) 108 (90 Base) MCG/ACT inhaler Inhale 2 puffs into the lungs every 6 (six) hours as needed for wheezing or shortness of breath. 06/27/18   Icard, Leory Plowman L, DO  amoxicillin-clavulanate (AUGMENTIN) 400-57 MG/5ML suspension Take 11 mLs (880 mg total) by mouth every 12 (twelve) hours for 5 days. 08/10/19 08/15/19  Terrilee Croak, MD  feeding supplement, ENSURE ENLIVE, (ENSURE ENLIVE) LIQD Take 237 mLs by mouth 3 (three) times daily between meals. Patient taking differently: Take 237 mLs by mouth daily.  08/11/17   Ghimire, Henreitta Leber, MD  fluconazole (DIFLUCAN) 200 MG tablet Take 1 tablet (200 mg total) by mouth daily for 7 days. 08/11/19 08/18/19  Terrilee Croak, MD  folic acid (FOLVITE) 1 MG tablet Take 1 tablet (1 mg total) by mouth daily. 05/15/19   [name], MD  lidocaine (XYLOCAINE) 2 % solution Use as directed 15 mLs in the mouth or throat every 4 (four) hours as needed for up to 7 days  for mouth pain. 08/10/19 08/17/19  Terrilee Croak, MD  magic mouthwash SOLN Take 5 mLs by mouth 4 (four) times daily as needed for mouth pain. 07/31/19   Heilingoetter, Cassandra L, PA-C  magic mouthwash w/lidocaine SOLN Take 5 mLs by mouth 4 (four) times daily as needed for up to 7 days for mouth pain. 08/10/19 08/17/19  Terrilee Croak, MD  magnesium oxide (MAG-OX) 400 (241.3 Mg) MG tablet Take 1 tablet (400 mg total) by mouth 2 (two) times daily. 05/15/19   [name], MD  oxyCODONE-acetaminophen (PERCOCET/ROXICET) 5-325 MG tablet Take 1 tablet by mouth every 6 (six) hours as needed  for up to 5 days for moderate pain or severe pain. 08/10/19 08/15/19  Terrilee Croak, MD  potassium chloride SA (KLOR-CON) 20 MEQ tablet Take 1 tablet (20 mEq total) by mouth 2 (two) times daily. 07/31/19   Heilingoetter, Cassandra L, PA-C  senna-docusate (SENOKOT-S) 8.6-50 MG tablet Take 1 tablet by mouth at bedtime as needed for mild constipation. 08/10/19 09/09/19  Terrilee Croak, MD     Allergies:    No Known Allergies   Physical Exam:   Vitals  Blood pressure (!) 125/95, pulse (!) 123, temperature 98 F (36.7 C), temperature source Oral, resp. rate 16, SpO2 100 %.  1.  General: axoxo3  2. Psychiatric: euthymic  3. Neurologic: Nonfocal,   4. HEENMT:  Anicteric, pale conjuctiva, pupils 1.62mm symmetric, direct, consensual, intact Neck: no jvd  5. Respiratory : + bibasilar crackles right > left, no wheezing  6. Cardiovascular : Tachy s1, s2, no m/g/r  7. Gastrointestinal:  Abd: soft, nt, nd, +bs  8. Skin:  Ext: no c/c/e, no rash  9.Musculoskeletal:  Good ROM    Data Review:    CBC Recent Labs  Lab 08/07/19 0412 08/08/19 0256 08/09/19 0250 08/10/19 0327 08/11/19 1703  WBC 0.9* 2.2* 5.0 8.3 9.1  HGB 8.5* 8.0* 8.1* 8.1* 7.4*  HCT 26.2* 25.1* 25.6* 26.9* 23.7*  PLT 32* 19* 13* 10* 11*  MCV 84.8 86.0 87.7 89.4 87.5  MCH 27.5 27.4 27.7 26.9 27.3  MCHC 32.4 31.9 31.6 30.1 31.2  RDW 16.5*  17.0* 17.2* 17.8* 18.2*  LYMPHSABS 0.2* 0.2* 0.4* 0.6* 0.9  MONOABS 0.2 0.4 0.6 0.9 1.7*  EOSABS 0.1 0.1 0.1 0.1 0.1  BASOSABS 0.0 0.1 0.1 0.0 0.0   ------------------------------------------------------------------------------------------------------------------  Results for orders placed or performed during the hospital encounter of 08/11/19 (from the past 48 hour(s))  Basic metabolic panel     Status: Abnormal   Collection Time: 08/11/19  5:03 PM  Result Value Ref Range   Sodium 140 135 - 145 mmol/L   Potassium 3.5 3.5 - 5.1 mmol/L   Chloride 99 98 - 111 mmol/L   CO2 31 22 - 32 mmol/L   Glucose, Bld 82 70 - 99 mg/dL   BUN 19 6 - 20 mg/dL   Creatinine, Ser 1.11 0.61 - 1.24 mg/dL   Calcium 7.9 (L) 8.9 - 10.3 mg/dL   GFR calc non Af Amer >60 >60 mL/min   GFR calc Af Amer >60 >60 mL/min   Anion gap 10 5 - 15    Comment: Performed at [facility], [place] [address] [place], [place] [address]  CBC with Differential     Status: Abnormal   Collection Time: 08/11/19  5:03 PM  Result Value Ref Range   WBC 9.1 4.0 - 10.5 K/uL   RBC 2.71 (L) 4.22 - 5.81 MIL/uL   Hemoglobin 7.4 (L) 13.0 - 17.0 g/dL   HCT 23.7 (L) 39.0 - 52.0 %   MCV 87.5 80.0 - 100.0 fL   MCH 27.3 26.0 - 34.0 pg   MCHC 31.2 30.0 - 36.0 g/dL   RDW 18.2 (H) 11.5 - 15.5 %   Platelets 11 (LL) 150 - 400 K/uL    Comment: Immature Platelet Fraction may be clinically indicated, consider ordering this additional test [id number] THIS CRITICAL RESULT HAS VERIFIED AND BEEN CALLED TO [place],G BY KENNEDY JACKSON ON 01 30 2021 AT 1719, AND HAS BEEN READ BACK. CRITICAL RESULT VERIFIED    nRBC 0.0 0.0 - 0.2 %  Neutrophils Relative % 65 %   Neutro Abs 5.9 1.7 - 7.7 K/uL   Lymphocytes Relative 10 %   Lymphs Abs 0.9 0.7 - 4.0 K/uL   Monocytes Relative 19 %   Monocytes Absolute 1.7 (H) 0.1 - 1.0 K/uL   Eosinophils Relative 1 %   Eosinophils Absolute 0.1 0.0 - 0.5 K/uL   Basophils Relative 0 %   Basophils  Absolute 0.0 0.0 - 0.1 K/uL   WBC Morphology DOHLE BODIES     Comment: MODERATE LEFT SHIFT (>5% METAS AND MYELOS,OCC PRO NOTED) TOXIC GRANULATION    Immature Granulocytes 5 %   Abs Immature Granulocytes 0.48 (H) 0.00 - 0.07 K/uL    Comment: Performed at [facility], [address] [place], [place] [address]    Chemistries  Recent Labs  Lab 08/05/19 [phone] 08/05/19 0251 08/06/19 0739 08/08/19 0256 08/09/19 0250 08/10/19 0327 08/11/19 1703  NA 137   < > 136 139 138 139 140  K 2.6*   < > 3.1* 2.8* 3.9 3.7 3.5  CL 96*   < > 92* 96* 97* 100 99  CO2 30   < > 32 33* 32 31 31  GLUCOSE 95   < > 88 89 88 87 82  BUN 20   < > 19 24* 26* 22* 19  CREATININE 1.06   < > 1.14 1.08 1.29* 1.09 1.11  CALCIUM 7.7*   < > 8.1* 7.9* 8.0* 8.1* 7.9*  MG 2.0  --   --   --   --   --   --    < > = values in this interval not displayed.   ------------------------------------------------------------------------------------------------------------------  ------------------------------------------------------------------------------------------------------------------ GFR: Estimated Creatinine Clearance: 52.7 mL/min (by C-G formula based on SCr of 1.11 mg/dL). Liver Function Tests: No results for input(s): AST, ALT, ALKPHOS, BILITOT, PROT, ALBUMIN in the last 168 hours. No results for input(s): LIPASE, AMYLASE in the last 168 hours. No results for input(s): AMMONIA in the last 168 hours. Coagulation Profile: No results for input(s): INR, PROTIME in the last 168 hours. Cardiac Enzymes: No results for input(s): CKTOTAL, CKMB, CKMBINDEX, TROPONINI in the last 168 hours. BNP (last 3 results) No results for input(s): PROBNP in the last 8760 hours. HbA1C: No results for input(s): HGBA1C in the last 72 hours. CBG: No results for input(s): GLUCAP in the last 168 hours. Lipid Profile: No results for input(s): CHOL, HDL, LDLCALC, TRIG, CHOLHDL, LDLDIRECT in the last 72 hours. Thyroid  Function Tests: No results for input(s): TSH, T4TOTAL, FREET4, T3FREE, THYROIDAB in the last 72 hours. Anemia Panel: No results for input(s): VITAMINB12, FOLATE, FERRITIN, TIBC, IRON, RETICCTPCT in the last 72 hours.  --------------------------------------------------------------------------------------------------------------- Urine analysis:    Component Value Date/Time   COLORURINE YELLOW 08/03/2019 1112   APPEARANCEUR HAZY (A) 08/03/2019 1112   LABSPEC 1.014 08/03/2019 1112   PHURINE 5.0 08/03/2019 1112   GLUCOSEU NEGATIVE 08/03/2019 1112   HGBUR NEGATIVE 08/03/2019 1112   BILIRUBINUR NEGATIVE 08/03/2019 1112   KETONESUR NEGATIVE 08/03/2019 1112   PROTEINUR 30 (A) 08/03/2019 1112   NITRITE NEGATIVE 08/03/2019 1112   LEUKOCYTESUR NEGATIVE 08/03/2019 1112      Imaging Results:    CT Angio Chest PE W and/or Wo Contrast  Result Date: 08/11/2019 CLINICAL DATA:  Hemoptysis. Dizziness. History of lung cancer. Concern for pulmonary embolism. Known right lower lobe stage IV adenocarcinoma, status post chemotherapy and radiation therapy in July 2020. EXAM: CT ANGIOGRAPHY CHEST WITH CONTRAST TECHNIQUE: Multidetector CT imaging of the  chest was performed using the standard protocol during bolus administration of intravenous contrast. Multiplanar CT image reconstructions and MIPs were obtained to evaluate the vascular anatomy. CONTRAST:  29mL OMNIPAQUE IOHEXOL 350 MG/ML SOLN COMPARISON:  08/11/2019 chest radiograph.  CT chest 07/23/2019. FINDINGS: Cardiovascular: The quality of this exam for evaluation of pulmonary embolism is good. No evidence of pulmonary embolism. Aortic and branch vessel atherosclerosis. Tortuous thoracic aorta. Moderate cardiomegaly, without pericardial effusion. Left circumflex coronary artery atherosclerosis. Mediastinum/Nodes: Suggestion of soft tissue fullness within the inferior mediastinum, including within the subcarinal space on 79/4. This extends into the  peribronchovascular soft tissues surrounding the right lower lobe bronchi, including on 96/4. No well-defined adenopathy. Lungs/Pleura: Similar small right pleural effusion. New trace left pleural fluid. Similar mass-effect upon right middle and right lower lobe bronchi. Biapical pleuroparenchymal scarring. Pulmonary nodules, within example 7 mm right apical nodule on 51/10 measuring similar on the prior CT. Redemonstration of right lower lung consolidation, ground-glass, and airspace disease. This is slightly increased, including within the posterolateral right middle and anterolateral right lower lobes. New left lower lobe airspace and ground-glass opacity. Underlying centrilobular and paraseptal emphysema. Upper Abdomen: Possible trace perihepatic ascites. Normal imaged portions of the spleen, stomach, pancreas, adrenal glands, kidneys. Musculoskeletal: No acute osseous abnormality. Review of the MIP images confirms the above findings. IMPRESSION: 1.  No evidence of pulmonary embolism. 2. Worsened right lower lung aeration in the setting of consolidation, airspace, and ground-glass opacity. New left lower lobe consolidation and airspace disease. Presuming no recent radiation therapy, considerations include progressive infection/aspiration or tumor in this patient with a history of stage IV adenocarcinoma. 3. Developing soft tissue fullness within the inferior mediastinum and right infrahilar region. Similarly, this could be radiation induced or represent tumor spread. No well-defined adenopathy. 4. Similar small right and development of trace left pleural fluid. 5. Aortic atherosclerosis (ICD10-I70.0), coronary artery atherosclerosis and emphysema (ICD10-J43.9). 6. Pulmonary nodules, as before. 7. Consider 6-12 week follow-up CT, possibly after antibiotic therapy. Electronically Signed   By: Abigail Miyamoto M.D.   On: 08/11/2019 18:35   DG Chest Portable 1 View  Result Date: 08/11/2019 CLINICAL DATA:  Cough.  History of lung cancer and COPD. Pneumonia. EXAM: PORTABLE CHEST 1 VIEW COMPARISON:  August 03, 2019 FINDINGS: Effusion and opacity remains in the right lower lung, mildly worsened in the interval. The left lung is clear. No pneumothorax. No change in the cardiomediastinal silhouette. IMPRESSION: Mild worsening of right-sided effusion and underlying opacity. Recommend follow-up to resolution. Electronically Signed   By: Dorise Bullion III M.D   On: 08/11/2019 16:09       Assessment & Plan:    Principal Problem:   Hemoptysis Active Problems:   Anemia   Thrombocytopenia (HCC)  Hemoptysis Check inr, ptt Pt ordered platelets by ED Check cbc in am DC Xarelto   Anemia Transfusion w 2 units prbc ordered per ED  Thrombocytopenia 1 units of platelets ordered by ED  Permanent Afib  DC Xarelto  Hcap No blood culture since patient on abx vanco iv, zosyn iv  Copd Albuterol HFA 2puff q6h prn   Hypertension Cont Metoprolol 25mg  po bid  DVT Prophylaxis-    SCDs    AM Labs Ordered, also please review Full Orders  Family Communication: Admission, patients condition and plan of care including tests being ordered have been discussed with the patient  who indicate understanding and agree with the plan and Code Status.  Code Status:  DNR per patient, notified wife that  patient admitted to [facility], and that if she has a discussion with the patient and he changes his mind that we can change his code status at any time.   Admission status: Observationt: Based on patients clinical presentation and evaluation of above clinical data, I have made determination that patient meets observation criteria at this time.   Time spent in minutes : 70    Jani Gravel M.D on 08/11/2019 at 8:34 PM

## 2019-08-12 ENCOUNTER — Encounter (HOSPITAL_COMMUNITY): Payer: Self-pay | Admitting: Internal Medicine

## 2019-08-12 ENCOUNTER — Other Ambulatory Visit: Payer: Self-pay

## 2019-08-12 DIAGNOSIS — D6481 Anemia due to antineoplastic chemotherapy: Secondary | ICD-10-CM | POA: Diagnosis present

## 2019-08-12 DIAGNOSIS — E440 Moderate protein-calorie malnutrition: Secondary | ICD-10-CM | POA: Diagnosis not present

## 2019-08-12 DIAGNOSIS — Z9221 Personal history of antineoplastic chemotherapy: Secondary | ICD-10-CM | POA: Diagnosis not present

## 2019-08-12 DIAGNOSIS — T451X5A Adverse effect of antineoplastic and immunosuppressive drugs, initial encounter: Secondary | ICD-10-CM | POA: Diagnosis present

## 2019-08-12 DIAGNOSIS — R042 Hemoptysis: Secondary | ICD-10-CM

## 2019-08-12 DIAGNOSIS — Z66 Do not resuscitate: Secondary | ICD-10-CM | POA: Diagnosis present

## 2019-08-12 DIAGNOSIS — G8929 Other chronic pain: Secondary | ICD-10-CM | POA: Diagnosis present

## 2019-08-12 DIAGNOSIS — Z7901 Long term (current) use of anticoagulants: Secondary | ICD-10-CM | POA: Diagnosis not present

## 2019-08-12 DIAGNOSIS — D6959 Other secondary thrombocytopenia: Secondary | ICD-10-CM | POA: Diagnosis present

## 2019-08-12 DIAGNOSIS — D696 Thrombocytopenia, unspecified: Secondary | ICD-10-CM

## 2019-08-12 DIAGNOSIS — F17210 Nicotine dependence, cigarettes, uncomplicated: Secondary | ICD-10-CM | POA: Diagnosis present

## 2019-08-12 DIAGNOSIS — J449 Chronic obstructive pulmonary disease, unspecified: Secondary | ICD-10-CM | POA: Diagnosis present

## 2019-08-12 DIAGNOSIS — Z681 Body mass index (BMI) 19 or less, adult: Secondary | ICD-10-CM | POA: Diagnosis not present

## 2019-08-12 DIAGNOSIS — R1310 Dysphagia, unspecified: Secondary | ICD-10-CM

## 2019-08-12 DIAGNOSIS — D649 Anemia, unspecified: Secondary | ICD-10-CM

## 2019-08-12 DIAGNOSIS — M549 Dorsalgia, unspecified: Secondary | ICD-10-CM | POA: Diagnosis present

## 2019-08-12 DIAGNOSIS — Z923 Personal history of irradiation: Secondary | ICD-10-CM | POA: Diagnosis not present

## 2019-08-12 DIAGNOSIS — Z7189 Other specified counseling: Secondary | ICD-10-CM | POA: Diagnosis not present

## 2019-08-12 DIAGNOSIS — I4821 Permanent atrial fibrillation: Secondary | ICD-10-CM | POA: Diagnosis present

## 2019-08-12 DIAGNOSIS — Z86711 Personal history of pulmonary embolism: Secondary | ICD-10-CM | POA: Diagnosis not present

## 2019-08-12 DIAGNOSIS — Z8249 Family history of ischemic heart disease and other diseases of the circulatory system: Secondary | ICD-10-CM | POA: Diagnosis not present

## 2019-08-12 DIAGNOSIS — R05 Cough: Secondary | ICD-10-CM | POA: Diagnosis not present

## 2019-08-12 DIAGNOSIS — D61818 Other pancytopenia: Secondary | ICD-10-CM | POA: Diagnosis present

## 2019-08-12 DIAGNOSIS — Z8701 Personal history of pneumonia (recurrent): Secondary | ICD-10-CM | POA: Diagnosis not present

## 2019-08-12 DIAGNOSIS — I10 Essential (primary) hypertension: Secondary | ICD-10-CM | POA: Diagnosis present

## 2019-08-12 DIAGNOSIS — C3491 Malignant neoplasm of unspecified part of right bronchus or lung: Secondary | ICD-10-CM

## 2019-08-12 DIAGNOSIS — Z20822 Contact with and (suspected) exposure to covid-19: Secondary | ICD-10-CM | POA: Diagnosis not present

## 2019-08-12 DIAGNOSIS — Z515 Encounter for palliative care: Secondary | ICD-10-CM | POA: Diagnosis not present

## 2019-08-12 DIAGNOSIS — C3490 Malignant neoplasm of unspecified part of unspecified bronchus or lung: Secondary | ICD-10-CM | POA: Diagnosis not present

## 2019-08-12 LAB — CBC
HCT: 22.9 % — ABNORMAL LOW (ref 39.0–52.0)
Hemoglobin: 7.1 g/dL — ABNORMAL LOW (ref 13.0–17.0)
MCH: 27.2 pg (ref 26.0–34.0)
MCHC: 31 g/dL (ref 30.0–36.0)
MCV: 87.7 fL (ref 80.0–100.0)
Platelets: 13 10*3/uL — CL (ref 150–400)
RBC: 2.61 MIL/uL — ABNORMAL LOW (ref 4.22–5.81)
RDW: 18 % — ABNORMAL HIGH (ref 11.5–15.5)
WBC: 9.5 10*3/uL (ref 4.0–10.5)
nRBC: 0 % (ref 0.0–0.2)

## 2019-08-12 LAB — RETICULOCYTES
Immature Retic Fract: 14 % (ref 2.3–15.9)
RBC.: 3.43 MIL/uL — ABNORMAL LOW (ref 4.22–5.81)
Retic Count, Absolute: 7.2 10*3/uL — ABNORMAL LOW (ref 19.0–186.0)
Retic Ct Pct: <0.4 % — ABNORMAL LOW (ref 0.4–3.1)

## 2019-08-12 LAB — COMPREHENSIVE METABOLIC PANEL
ALT: 13 U/L (ref 0–44)
AST: 30 U/L (ref 15–41)
Albumin: 1.9 g/dL — ABNORMAL LOW (ref 3.5–5.0)
Alkaline Phosphatase: 80 U/L (ref 38–126)
Anion gap: 9 (ref 5–15)
BUN: 16 mg/dL (ref 6–20)
CO2: 31 mmol/L (ref 22–32)
Calcium: 7.5 mg/dL — ABNORMAL LOW (ref 8.9–10.3)
Chloride: 98 mmol/L (ref 98–111)
Creatinine, Ser: 1.14 mg/dL (ref 0.61–1.24)
GFR calc Af Amer: >60 mL/min (ref >60)
GFR calc non Af Amer: >60 mL/min (ref >60)
Glucose, Bld: 96 mg/dL (ref 70–99)
Potassium: 3.3 mmol/L — ABNORMAL LOW (ref 3.5–5.1)
Sodium: 138 mmol/L (ref 135–145)
Total Bilirubin: 0.6 mg/dL (ref 0.3–1.2)
Total Protein: 6.2 g/dL — ABNORMAL LOW (ref 6.5–8.1)

## 2019-08-12 LAB — PROTIME-INR
INR: 1.5 — ABNORMAL HIGH (ref 0.8–1.2)
Prothrombin Time: 18.1 seconds — ABNORMAL HIGH (ref 11.4–15.2)

## 2019-08-12 LAB — FIBRINOGEN: Fibrinogen: 529 mg/dL — ABNORMAL HIGH (ref 210–475)

## 2019-08-12 LAB — APTT: aPTT: 44 seconds — ABNORMAL HIGH (ref 24–36)

## 2019-08-12 LAB — SARS CORONAVIRUS 2 (TAT 6-24 HRS): SARS Coronavirus 2: NEGATIVE

## 2019-08-12 MED ORDER — PHENOL 1.4 % MT LIQD
1.0000 | OROMUCOSAL | Status: DC | PRN
  Filled 2019-08-12: qty 177

## 2019-08-12 MED ORDER — GUAIFENESIN-DM 100-10 MG/5ML PO SYRP
5.0000 mL | ORAL_SOLUTION | ORAL | Status: DC | PRN
  Administered 2019-08-12: 5 mL via ORAL
  Filled 2019-08-12: qty 10

## 2019-08-12 MED ORDER — HYDROMORPHONE HCL 1 MG/ML IJ SOLN
0.5000 mg | INTRAMUSCULAR | Status: DC | PRN
  Administered 2019-08-12 – 2019-08-14 (×6): 0.5 mg via INTRAVENOUS
  Filled 2019-08-12 (×5): qty 0.5

## 2019-08-12 MED ORDER — VANCOMYCIN HCL 750 MG/150ML IV SOLN
750.0000 mg | Freq: Two times a day (BID) | INTRAVENOUS | Status: DC
  Administered 2019-08-12 (×2): 750 mg via INTRAVENOUS
  Filled 2019-08-12 (×3): qty 150

## 2019-08-12 MED ORDER — ONDANSETRON HCL 4 MG/2ML IJ SOLN: 4.0000 mg | Freq: Four times a day (QID) | INTRAMUSCULAR | Status: DC | PRN

## 2019-08-12 NOTE — Progress Notes (Signed)
Pharmacy Antibiotic Note  Tyler [name] is a 59 y.o. male admitted on 08/11/2019 with pneumonia.  Pharmacy has been consulted for Vancomycin and Zosyn dosing.  Plan: Vancomycin 1gm iv x1, then Vancomycin 750 mg IV Q 12 hrs. Goal AUC 400-550. Expected AUC: 541 SCr used: 1.14  Zosyn 3.375gm iv x1, then Zosyn 3.375g IV Q8H infused over 4hrs.    Height: 6\' 2"  (188 cm) Weight: 119 lb 12.8 oz (54.3 kg) IBW/kg (Calculated) : 82.2  Temp (24hrs), Avg:98.6 F (37 C), Min:98 F (36.7 C), Max:99.7 F (37.6 C)  Recent Labs  Lab 08/08/19 0256 08/09/19 0250 08/10/19 0327 08/11/19 1703 08/12/19 0017  WBC 2.2* 5.0 8.3 9.1 9.5  CREATININE 1.08 1.29* 1.09 1.11 1.14    Estimated Creatinine Clearance: 52.9 mL/min (by C-G formula based on SCr of 1.14 mg/dL).    No Known Allergies  Antimicrobials this admission: Vancomycin 08/11/2019 >> Zosyn 08/11/2019 >>  Dose adjustments this admission: -  Microbiology results: -  Thank you for allowing pharmacy to be a part of this patient's care.  Tyler [name] 08/12/2019 6:08 AM

## 2019-08-12 NOTE — Progress Notes (Signed)
Per tele, pt had a 7 beat run of VT this AM. Pt was asymptomatic, VSS. MD Dahal aware. Currently remains in A-Fib. Will continue to monitor.

## 2019-08-12 NOTE — Progress Notes (Signed)
PT Cancellation Note  Patient Details Name: Tyler [name] MRN: [id number] DOB: 06-15-1961   Cancelled Treatment:     PT order received but eval deferred to am 2* pt fatigue.  Per RN, pt just finished unit of platelets with two prior units of blood and currently running temp.  Will follow.   Lolly Glaus 08/12/2019, 2:57 PM

## 2019-08-12 NOTE — Progress Notes (Addendum)
PROGRESS NOTE  Tyler [name]  DOB: 08/09/1960  PCP: [name], MD UVO:[id number]  DOA: 08/11/2019 Admitted From: Home  LOS: 0 days   Chief Complaint  Patient presents with  . Hemoptysis   Brief narrative: AADITH [name] a 59 y.o.malewith medical history significant ofpermanent atrial fibrillation, stage IV non-small cell lung cancer/adenocarcinoma on current chemotherapy, history of pulmonary embolism, EtOH abuse, tobacco abuse. Patient was recently hospitalized from 1/22 to 1/29 for Neutropenic fever, right-sided pneumonia, respiratory failure, inability to swallow.  He was on chronic Xarelto because of history of A. fib as well as pulmonary embolism.  In last admission, because of his pancytopenia, he required multiple units of PRBC transfusion as well as platelet transfusion.  On the day of discharge his platelet count was 10 and he received 1 unit of platelet before discharge.  At discharge, Xarelto was stopped.  Palliative care consultation was obtained.  Patient continued to chose aggressive care.  Patient was sent home with home health PT and had an appointment with oncology on 2/2. However, patient returned back to the ED within 48 hours on 1/30 with complaint of hemoptysis.  In the ED, patient was afebrile, hemodynamically stable, O2 sat 100% on 2 L by nasal cannula. Blood work showed WBC 9.1, hemoglobin 7.4, platelet 11. CTA chest showed no pulmonary medicine but worse than right lower lung radiation, suggestive of aspiration.  It also showed developing soft tissue fullness within the inferomedial sternum and right infrahilar region suggestive of tumor spread. Patient was ordered for transfusion of 2 units of PRBC and 1 unit of platelet. Patient was admitted to hospitalist medicine service for further evaluation management.  Subjective: Patient was seen and examined this morning.  Chronically weak African-American male, looks older for his age. Propped up. He  does not look much different to me compared to how he looked at the time of last discharge. No further episode of hemoptysis since arrival to the ED.  Assessment/Plan: Acute hemoptysis -Likely related to worsening lung cancer status and low platelets. -Off Xarelto since last admission. -No further episode of hemoptysis since ED arrival.  Pancytopenia -Related to recent chemotherapy.  Last admission while patient got Granix.   -Hemoglobin 7.1 and platelet 13 last night.  Receiving transfusions this morning.  Repeat labs tomorrow. -WBC count seems to be at least normal at this time.  Stage IV adenocarcinoma right lung -Patient is followed up by medical oncology Dr. [name].  -Last admission, patient chose to continue aggressive care.  He has a follow-up appointment with oncology on 2/2.  I am not sure if he is a good candidate for any further round of chemotherapy. -Oncology consultation requested.  Odynophagia -Continues to have painful swallowing.  In last admission he was suspected to have mucositis versus esophageal candidiasis  -He continues to be on fluconazole, Magic mouthwash and viscous lidocaine. -We need to encourage oral appetite.  -If patient's symptoms do not improve and he continues to have poor appetite, poor hydration and at risk of deterioration, he may need a PEG tube placement.  However, with significant low platelet count, is not able to get a PEG tube placed at this time.  Permanent atrial fibrillation -Continue metoprolol 25 mg twice daily -Xarelto has been stopped.  History of pulmonary embolism -Off Xarelto.  CT angio of chest in this admission did not show any new pulmonary embolism.    Malnutrition - Body mass index is 15.38 kg/m. Nutrition consulted.  Mobility: PT eval Diet: Encourage regular  diet Fluid: None IV fluid DVT prophylaxis:  SCDs Code Status:  Full code.  I noted that on admission, DNR order was placed.  Patient this morning  clarifies that he still continues to choose aggressive care.  His prognosis is poor and he is an appropriate candidate for palliative care services/hospice.  I discussed this with oncology attending today.  We may need to know palliative care again. Family Communication:   Expected Discharge:  In his last hospital admission, he wanted to continue aggressive care, refused SNF and chose to go home with home health.  We were aware of his high risk of readmission.  He continues to be weak.  Seems to have enlarging cancer.  Continues to be pancytopenic.  He needs further transfusion and monitoring.  I will involve palliative care again and truly recommend for palliative care/hospice care.  He is at high risk of readmission if discharged again without an appropriate plan.  Not ready for discharge today.  Continue inpatient care.  Consultants:  Oncology  Procedures:  None  Antimicrobials: Anti-infectives (From admission, onward)   Start     Dose/Rate Route Frequency Ordered Stop   08/12/19 1000  fluconazole (DIFLUCAN) tablet 200 mg     200 mg Oral Daily 08/11/19 2231     08/12/19 1000  vancomycin (VANCOREADY) IVPB 750 mg/150 mL     750 mg 150 mL/hr over 60 Minutes Intravenous Every 12 hours 08/12/19 0603     08/11/19 2245  piperacillin-tazobactam (ZOSYN) IVPB 3.375 g     3.375 g 12.5 mL/hr over 240 Minutes Intravenous Every 8 hours 08/11/19 2235     08/11/19 2245  vancomycin (VANCOCIN) IVPB 1000 mg/200 mL premix     1,000 mg 200 mL/hr over 60 Minutes Intravenous  Once 08/11/19 2235 08/12/19 0059        Code Status: DNR   Diet Order            Diet regular Room service appropriate? Yes; Fluid consistency: Thin  Diet effective now              Infusions:  . sodium chloride Stopped (08/11/19 2026)  . piperacillin-tazobactam (ZOSYN)  IV 3.375 g (08/12/19 0537)  . vancomycin 750 mg (08/12/19 1040)    Scheduled Meds: . feeding supplement (ENSURE ENLIVE)  237 mL Oral Daily  .  fluconazole  200 mg Oral Daily  . folic acid  1 mg Oral Daily  . magnesium oxide  400 mg Oral BID  . metoprolol tartrate  25 mg Oral BID  . potassium chloride SA  20 mEq Oral BID    PRN meds: acetaminophen **OR** acetaminophen, albuterol, guaiFENesin-dextromethorphan, HYDROmorphone (DILAUDID) injection, magic mouthwash, ondansetron (ZOFRAN) IV, senna-docusate   Objective: Vitals:   08/12/19 0907 08/12/19 0937  BP: (!) 136/95 (!) 135/98  Pulse: (!) 109 (!) 109  Resp: 20 18  Temp: 98.6 F (37 C) 98.4 F (36.9 C)  SpO2: 100% 100%    Intake/Output Summary (Last 24 hours) at 08/12/2019 1049 Last data filed at 08/12/2019 0912 Gross per 24 hour  Intake 2183.62 ml  Output 750 ml  Net 1433.62 ml   Filed Weights   08/11/19 2241 08/12/19 0500  Weight: 53.8 kg 54.3 kg   Weight change:  Body mass index is 15.38 kg/m.   Physical Exam: General exam: Appears calm and comfortable.  Skin: No rashes, lesions or ulcers. HEENT: Atraumatic, normocephalic, supple neck, no obvious bleeding Lungs: Mild scattered rales bilaterally CVS: Regular rate and rhythm, no  murmur GI/Abd soft, nontender, nondistended, bowel sound present CNS: Alert, awake, oriented x3 Psychiatry: Mood seems depressed Extremities: No pedal edema, no calf tenderness  Data Review: I have personally reviewed the laboratory data and studies available.  Recent Labs  Lab 08/07/19 0412 08/07/19 0412 08/08/19 0256 08/09/19 0250 08/10/19 0327 08/11/19 1703 08/12/19 0017  WBC 0.9*   < > 2.2* 5.0 8.3 9.1 9.5  NEUTROABS 0.4*  --  1.3* 3.5 5.9 5.9  --   HGB 8.5*   < > 8.0* 8.1* 8.1* 7.4* 7.1*  HCT 26.2*   < > 25.1* 25.6* 26.9* 23.7* 22.9*  MCV 84.8   < > 86.0 87.7 89.4 87.5 87.7  PLT 32*   < > 19* 13* 10* 11* 13*   < > = values in this interval not displayed.   Recent Labs  Lab 08/08/19 0256 08/09/19 0250 08/10/19 0327 08/11/19 1703 08/12/19 0017  NA 139 138 139 140 138  K 2.8* 3.9 3.7 3.5 3.3*  CL 96* 97* 100  99 98  CO2 33* 32 31 31 31   GLUCOSE 89 88 87 82 96  BUN 24* 26* 22* 19 16  CREATININE 1.08 1.29* 1.09 1.11 1.14  CALCIUM 7.9* 8.0* 8.1* 7.9* 7.5*    Terrilee Croak, MD  [facility] 08/12/2019

## 2019-08-12 NOTE — Progress Notes (Addendum)
Tyler [name]   DOB:03/03/1961   VQ#:[id number]   IEP#:[id number]  Oncology follow up  Subjective: 59 yo male with stage IV non-small cell lung cancer, followed by my partner Dr. [name] in the clinic, was admitted for hemoptysis, fatigue, poor oral intake.  His hemoptysis has resolved now, and he denies any has any bleeding.  He is very fatigued, no appetite, has not eaten or drunk much. He is afebrile, hemodynamically stable, except mild tachycardia.  He received 2 units of blood last night, and was getting platelet transfusion when I saw him this morning.   Objective:  Vitals:   08/12/19 0907 08/12/19 0937  BP: (!) 136/95 (!) 135/98  Pulse: (!) 109 (!) 109  Resp: 20 18  Temp: 98.6 F (37 C) 98.4 F (36.9 C)  SpO2: 100% 100%    Body mass index is 15.38 kg/m.  Intake/Output Summary (Last 24 hours) at 08/12/2019 1156 Last data filed at 08/12/2019 0912 Gross per 24 hour  Intake 2303.62 ml  Output 750 ml  Net 1553.62 ml     Sclerae unicteric  Oropharynx clear  No peripheral adenopathy  Lungs clear -- no rales or rhonchi  Heart regular rate and rhythm  Abdomen benign    CBG (last 3)  No results for input(s): GLUCAP in the last 72 hours.   Labs:  Urine Studies No results for input(s): UHGB, CRYS in the last 72 hours.  Invalid input(s): UACOL, UAPR, USPG, UPH, UTP, UGL, UKET, UBIL, UNIT, UROB, ULEU, UEPI, UWBC, [place], [place], [place], [place], [place]  Basic Metabolic Panel: Recent Labs  Lab 08/08/19 0256 08/08/19 0256 08/09/19 0250 08/09/19 0250 08/10/19 0327 08/10/19 0327 08/11/19 1703 08/12/19 0017  NA 139  --  138  --  139  --  140 138  K 2.8*   < > 3.9   < > 3.7   < > 3.5 3.3*  CL 96*  --  97*  --  100  --  99 98  CO2 33*  --  32  --  31  --  31 31  GLUCOSE 89  --  88  --  87  --  82 96  BUN 24*  --  26*  --  22*  --  19 16  CREATININE 1.08  --  1.29*  --  1.09  --  1.11 1.14  CALCIUM 7.9*  --  8.0*  --  8.1*  --  7.9* 7.5*   < > = values in this interval not  displayed.   GFR Estimated Creatinine Clearance: 52.9 mL/min (by C-G formula based on SCr of 1.14 mg/dL). Liver Function Tests: Recent Labs  Lab 08/12/19 0017  AST 30  ALT 13  ALKPHOS 80  BILITOT 0.6  PROT 6.2*  ALBUMIN 1.9*   No results for input(s): LIPASE, AMYLASE in the last 168 hours. No results for input(s): AMMONIA in the last 168 hours. Coagulation profile Recent Labs  Lab 08/12/19 0017  INR 1.5*    CBC: Recent Labs  Lab 08/07/19 0412 08/07/19 0412 08/08/19 0256 08/09/19 0250 08/10/19 0327 08/11/19 1703 08/12/19 0017  WBC 0.9*   < > 2.2* 5.0 8.3 9.1 9.5  NEUTROABS 0.4*  --  1.3* 3.5 5.9 5.9  --   HGB 8.5*   < > 8.0* 8.1* 8.1* 7.4* 7.1*  HCT 26.2*   < > 25.1* 25.6* 26.9* 23.7* 22.9*  MCV 84.8   < > 86.0 87.7 89.4 87.5 87.7  PLT 32*   < >  19* 13* 10* 11* 13*   < > = values in this interval not displayed.   Cardiac Enzymes: No results for input(s): CKTOTAL, CKMB, CKMBINDEX, TROPONINI in the last 168 hours. BNP: Invalid input(s): POCBNP CBG: No results for input(s): GLUCAP in the last 168 hours. D-Dimer No results for input(s): DDIMER in the last 72 hours. Hgb A1c No results for input(s): HGBA1C in the last 72 hours. Lipid Profile No results for input(s): CHOL, HDL, LDLCALC, TRIG, CHOLHDL, LDLDIRECT in the last 72 hours. Thyroid function studies No results for input(s): TSH, T4TOTAL, T3FREE, THYROIDAB in the last 72 hours.  Invalid input(s): FREET3 Anemia work up No results for input(s): VITAMINB12, FOLATE, FERRITIN, TIBC, IRON, RETICCTPCT in the last 72 hours. Microbiology Recent Results (from the past 240 hour(s))  Urine culture     Status: None   Collection Time: 08/03/19 11:12 AM   Specimen: Urine, Clean Catch  Result Value Ref Range Status   Specimen Description   Final    URINE, CLEAN CATCH Performed at [facility], [place] [address] [place], [place] [address]    Special Requests   Final    NONE Performed at [facility], [place] [address], [place], [place] [address]    Culture   Final    NO GROWTH Performed at [facility], [place] [address] [place], [place] [address]    Report Status 08/05/2019 FINAL  Final  Blood culture (routine x 2)     Status: None   Collection Time: 08/03/19 12:00 PM   Specimen: BLOOD LEFT HAND  Result Value Ref Range Status   Specimen Description   Final    BLOOD LEFT HAND Performed at [facility] [address] [place], [place] [address]    Special Requests   Final    BOTTLES DRAWN AEROBIC AND ANAEROBIC Blood Culture results may not be optimal due to an inadequate volume of blood received in culture bottles Performed at [facility] [address] [place], [place] [address]    Culture   Final    NO GROWTH 5 DAYS Performed at [facility], [place] [address] [place], [place] [address]    Report Status 08/08/2019 FINAL  Final  Blood culture (routine x 2)     Status: None   Collection Time: 08/03/19 12:39 PM   Specimen: BLOOD  Result Value Ref Range Status   Specimen Description   Final    BLOOD LEFT ARM Performed at [facility] [address], [place], [place] [address]    Special Requests   Final    BOTTLES DRAWN AEROBIC AND ANAEROBIC Blood Culture results may not be optimal due to an inadequate volume of blood received in culture bottles Performed at [facility] [address] [place], [place] [address]    Culture   Final    NO GROWTH 5 DAYS Performed at [facility], [place] [address] [place], [place] [address]    Report Status 08/08/2019 FINAL  Final  MRSA PCR Screening     Status: None   Collection Time: 08/03/19  3:24 PM   Specimen: Nasal Mucosa; Nasopharyngeal  Result Value Ref Range Status   MRSA by PCR NEGATIVE NEGATIVE Final    Comment:        The GeneXpert MRSA Assay (FDA approved for NASAL specimens only), is one component of  a comprehensive MRSA colonization surveillance program. It is not intended  to diagnose MRSA infection nor to guide or monitor treatment for MRSA infections. Performed at [facility], [place] [address] [place], [place] [address]   SARS CORONAVIRUS 2 (TAT 6-24 HRS) Nasopharyngeal Nasopharyngeal Swab     Status: None   Collection Time: 08/03/19  3:37 PM   Specimen: Nasopharyngeal Swab  Result Value Ref Range Status   SARS Coronavirus 2 NEGATIVE NEGATIVE Final    Comment: (NOTE) SARS-CoV-2 target nucleic acids are NOT DETECTED. The SARS-CoV-2 RNA is generally detectable in upper and lower respiratory specimens during the acute phase of infection. Negative results do not preclude SARS-CoV-2 infection, do not rule out co-infections with other pathogens, and should not be used as the sole basis for treatment or other patient management decisions. Negative results must be combined with clinical observations, patient history, and epidemiological information. The expected result is Negative. Fact Sheet for Patients: SugarRoll.be Fact Sheet for Healthcare Providers: https://www.woods-mathews.com/ This test is not yet approved or cleared by the [place] FDA and  has been authorized for detection and/or diagnosis of SARS-CoV-2 by FDA under an Emergency Use Authorization (EUA). This EUA will remain  in effect (meaning this test can be used) for the duration of the COVID-19 declaration under Section 56 4(b)(1) of the Act, 21 U.S.C. section 360bbb-3(b)(1), unless the authorization is terminated or revoked sooner. Performed at [facility], [place] [address] [place], [place] [address]   SARS CORONAVIRUS 2 (TAT 6-24 HRS) Nasopharyngeal Nasopharyngeal Swab     Status: None   Collection Time: 08/11/19  7:59 PM   Specimen: Nasopharyngeal Swab  Result Value Ref Range Status   SARS Coronavirus 2 NEGATIVE NEGATIVE Final    Comment:  (NOTE) SARS-CoV-2 target nucleic acids are NOT DETECTED. The SARS-CoV-2 RNA is generally detectable in upper and lower respiratory specimens during the acute phase of infection. Negative results do not preclude SARS-CoV-2 infection, do not rule out co-infections with other pathogens, and should not be used as the sole basis for treatment or other patient management decisions. Negative results must be combined with clinical observations, patient history, and epidemiological information. The expected result is Negative. Fact Sheet for Patients: SugarRoll.be Fact Sheet for Healthcare Providers: https://www.woods-mathews.com/ This test is not yet approved or cleared by the [place] FDA and  has been authorized for detection and/or diagnosis of SARS-CoV-2 by FDA under an Emergency Use Authorization (EUA). This EUA will remain  in effect (meaning this test can be used) for the duration of the COVID-19 declaration under Section 56 4(b)(1) of the Act, 21 U.S.C. section 360bbb-3(b)(1), unless the authorization is terminated or revoked sooner. Performed at [facility], [place] [address] [place], [place] [address]       Studies:  CT Angio Chest PE W and/or Wo Contrast  Result Date: 08/11/2019 CLINICAL DATA:  Hemoptysis. Dizziness. History of lung cancer. Concern for pulmonary embolism. Known right lower lobe stage IV adenocarcinoma, status post chemotherapy and radiation therapy in July 2020. EXAM: CT ANGIOGRAPHY CHEST WITH CONTRAST TECHNIQUE: Multidetector CT imaging of the chest was performed using the standard protocol during bolus administration of intravenous contrast. Multiplanar CT image reconstructions and MIPs were obtained to evaluate the vascular anatomy. CONTRAST:  97m OMNIPAQUE IOHEXOL 350 MG/ML SOLN COMPARISON:  08/11/2019 chest radiograph.  CT chest 07/23/2019. FINDINGS: Cardiovascular: The quality of this exam for evaluation of  pulmonary embolism is good. No evidence of pulmonary embolism. Aortic and branch vessel atherosclerosis. Tortuous thoracic aorta. Moderate cardiomegaly, without pericardial effusion.  Left circumflex coronary artery atherosclerosis. Mediastinum/Nodes: Suggestion of soft tissue fullness within the inferior mediastinum, including within the subcarinal space on 79/4. This extends into the peribronchovascular soft tissues surrounding the right lower lobe bronchi, including on 96/4. No well-defined adenopathy. Lungs/Pleura: Similar small right pleural effusion. New trace left pleural fluid. Similar mass-effect upon right middle and right lower lobe bronchi. Biapical pleuroparenchymal scarring. Pulmonary nodules, within example 7 mm right apical nodule on 51/10 measuring similar on the prior CT. Redemonstration of right lower lung consolidation, ground-glass, and airspace disease. This is slightly increased, including within the posterolateral right middle and anterolateral right lower lobes. New left lower lobe airspace and ground-glass opacity. Underlying centrilobular and paraseptal emphysema. Upper Abdomen: Possible trace perihepatic ascites. Normal imaged portions of the spleen, stomach, pancreas, adrenal glands, kidneys. Musculoskeletal: No acute osseous abnormality. Review of the MIP images confirms the above findings. IMPRESSION: 1.  No evidence of pulmonary embolism. 2. Worsened right lower lung aeration in the setting of consolidation, airspace, and ground-glass opacity. New left lower lobe consolidation and airspace disease. Presuming no recent radiation therapy, considerations include progressive infection/aspiration or tumor in this patient with a history of stage IV adenocarcinoma. 3. Developing soft tissue fullness within the inferior mediastinum and right infrahilar region. Similarly, this could be radiation induced or represent tumor spread. No well-defined adenopathy. 4. Similar small right and  development of trace left pleural fluid. 5. Aortic atherosclerosis (ICD10-I70.0), coronary artery atherosclerosis and emphysema (ICD10-J43.9). 6. Pulmonary nodules, as before. 7. Consider 6-12 week follow-up CT, possibly after antibiotic therapy. Electronically Signed   By: Abigail Miyamoto M.D.   On: 08/11/2019 18:35   DG Chest Portable 1 View  Result Date: 08/11/2019 CLINICAL DATA:  Cough. History of lung cancer and COPD. Pneumonia. EXAM: PORTABLE CHEST 1 VIEW COMPARISON:  August 03, 2019 FINDINGS: Effusion and opacity remains in the right lower lung, mildly worsened in the interval. The left lung is clear. No pneumothorax. No change in the cardiomediastinal silhouette. IMPRESSION: Mild worsening of right-sided effusion and underlying opacity. Recommend follow-up to resolution. Electronically Signed   By: Dorise Bullion III M.D   On: 08/11/2019 16:09    Assessment: 59 y.o.   1.  Hemoptysis, secondary to severe thrombocytopenia and Xarelto. 2.  Metastatic non-small cell lung cancer, on chemo carbo, Alimta and Keytruda, last dose on 1/12  3.  Odynophagia 4.  History of pulmonary embolism, was on Xarelto, last dose 08/11/19, held for now  5.  Anorexia secondary to cancer and chemo 6.  Moderate protein and calorie malnutrition 7. Deconditioning    Plan:  -will repeat CBC after platelet transfusion -He has persistent anemia and thrombocytopenia since last cycle chemotherapy, probably related to chemo, his PT and APTT are mildly elevated, possible DIC, willa dd fibrinogen and ret count, I called lab to add on  -He is very deconditioned, please consult dietitian -Palliative care has been called -will defer to Dr. Julien Nordmann to discuss goal of care. Pt understands his cancer is incurable now, and he has some difficulty to manage things at home, his wife is disabled. Please consult PT for evaluation    Truitt Merle, MD 08/12/2019  11:56 AM

## 2019-08-12 NOTE — Progress Notes (Addendum)
After receiving a unit of platelets, patient's temp is 100.4 oral. Before starting transfusion and 15 minutes after starting, pt's temp was 98.6 and 98.4. Because of the 2 point increase, MD Dahal was made aware. Per MD, a transfusion reaction protocol does not need to be initiated. PRN Tylenol was given per MD Dahal. All other VSS. Pt in no distress, continues to rest in bed comfortably. Pt's thermostat in room set to 85 degrees, and 4 blankets on patient with the door closed. Room temp decreased and some blankets removed. Will continue to monitor closely.

## 2019-08-13 DIAGNOSIS — C3490 Malignant neoplasm of unspecified part of unspecified bronchus or lung: Secondary | ICD-10-CM

## 2019-08-13 DIAGNOSIS — Z515 Encounter for palliative care: Secondary | ICD-10-CM

## 2019-08-13 DIAGNOSIS — Z7189 Other specified counseling: Secondary | ICD-10-CM

## 2019-08-13 DIAGNOSIS — R1310 Dysphagia, unspecified: Secondary | ICD-10-CM

## 2019-08-13 LAB — CBC WITH DIFFERENTIAL/PLATELET
Abs Immature Granulocytes: 0.56 10*3/uL — ABNORMAL HIGH (ref 0.00–0.07)
Basophils Absolute: 0.1 10*3/uL (ref 0.0–0.1)
Basophils Relative: 1 %
Eosinophils Absolute: 0.1 10*3/uL (ref 0.0–0.5)
Eosinophils Relative: 1 %
HCT: 34.6 % — ABNORMAL LOW (ref 39.0–52.0)
Hemoglobin: 11.5 g/dL — ABNORMAL LOW (ref 13.0–17.0)
Immature Granulocytes: 6 %
Lymphocytes Relative: 10 %
Lymphs Abs: 1 10*3/uL (ref 0.7–4.0)
MCH: 29.4 pg (ref 26.0–34.0)
MCHC: 33.2 g/dL (ref 30.0–36.0)
MCV: 88.5 fL (ref 80.0–100.0)
Monocytes Absolute: 2.1 10*3/uL — ABNORMAL HIGH (ref 0.1–1.0)
Monocytes Relative: 23 %
Neutro Abs: 5.6 10*3/uL (ref 1.7–7.7)
Neutrophils Relative %: 59 %
Platelets: 42 10*3/uL — ABNORMAL LOW (ref 150–400)
RBC: 3.91 MIL/uL — ABNORMAL LOW (ref 4.22–5.81)
RDW: 16.7 % — ABNORMAL HIGH (ref 11.5–15.5)
WBC: 9.3 10*3/uL (ref 4.0–10.5)
nRBC: 0 % (ref 0.0–0.2)

## 2019-08-13 LAB — BPAM RBC
Blood Product Expiration Date: 202102172359
Blood Product Expiration Date: 202102212359
ISSUE DATE / TIME: 202101310103
ISSUE DATE / TIME: 202101310547
Unit Type and Rh: 7300
Unit Type and Rh: 7300

## 2019-08-13 LAB — TYPE AND SCREEN
ABO/RH(D): B POS
Antibody Screen: NEGATIVE
Unit division: 0
Unit division: 0

## 2019-08-13 LAB — BASIC METABOLIC PANEL
Anion gap: 11 (ref 5–15)
BUN: 15 mg/dL (ref 6–20)
CO2: 30 mmol/L (ref 22–32)
Calcium: 7.2 mg/dL — ABNORMAL LOW (ref 8.9–10.3)
Chloride: 97 mmol/L — ABNORMAL LOW (ref 98–111)
Creatinine, Ser: 1.28 mg/dL — ABNORMAL HIGH (ref 0.61–1.24)
GFR calc Af Amer: >60 mL/min (ref >60)
GFR calc non Af Amer: >60 mL/min (ref >60)
Glucose, Bld: 83 mg/dL (ref 70–99)
Potassium: 3.1 mmol/L — ABNORMAL LOW (ref 3.5–5.1)
Sodium: 138 mmol/L (ref 135–145)

## 2019-08-13 LAB — PREPARE PLATELET PHERESIS: Unit division: 0

## 2019-08-13 LAB — BPAM PLATELET PHERESIS
Blood Product Expiration Date: 202102012359
ISSUE DATE / TIME: 202101310903
Unit Type and Rh: 5100

## 2019-08-13 LAB — VANCOMYCIN, RANDOM: Vancomycin Rm: 24

## 2019-08-13 MED ORDER — VANCOMYCIN HCL 750 MG/150ML IV SOLN
750.0000 mg | Freq: Once | INTRAVENOUS | Status: AC
  Administered 2019-08-13: 750 mg via INTRAVENOUS
  Filled 2019-08-13: qty 150

## 2019-08-13 MED ORDER — LOPERAMIDE HCL 2 MG PO CAPS
2.0000 mg | ORAL_CAPSULE | Freq: Once | ORAL | Status: AC
  Administered 2019-08-13: 22:00:00 2 mg via ORAL
  Filled 2019-08-13: qty 1

## 2019-08-13 MED ORDER — ADULT MULTIVITAMIN W/MINERALS CH
1.0000 | ORAL_TABLET | Freq: Every day | ORAL | Status: DC
  Administered 2019-08-14: 10:00:00 1 via ORAL
  Filled 2019-08-13 (×2): qty 1

## 2019-08-13 MED ORDER — VANCOMYCIN VARIABLE DOSE PER UNSTABLE RENAL FUNCTION (PHARMACIST DOSING): Status: DC

## 2019-08-13 MED ORDER — PRO-STAT SUGAR FREE PO LIQD
30.0000 mL | Freq: Two times a day (BID) | ORAL | Status: DC
  Administered 2019-08-13: 30 mL via ORAL
  Filled 2019-08-13: qty 30

## 2019-08-13 MED ORDER — LIDOCAINE VISCOUS HCL 2 % MT SOLN: 15.0000 mL | Freq: Four times a day (QID) | OROMUCOSAL | Status: DC | PRN

## 2019-08-13 NOTE — Progress Notes (Signed)
HEMATOLOGY-ONCOLOGY PROGRESS NOTE  SUBJECTIVE: Events from the weekend noted.  Patient is now readmitted secondary to hemoptysis.  He has received 2 units PRBCs and 1 unit of platelets.  He currently denies bleeding.  States that he has pain in his chest when he coughs.  Still having pain in his mouth and throat.  States that he has difficulty taking care of himself at home and that his wife is disabled and cannot really help out much.  He has expressed interest in palliative care services.  Oncology History  Adenocarcinoma of right lung, stage 3 ([place])  01/17/2019 Initial Diagnosis   Adenocarcinoma of right lung, stage 3 ([place])   01/29/2019 - 03/18/2019 Chemotherapy   The patient had palonosetron (ALOXI) injection 0.25 mg, 0.25 mg, Intravenous,  Once, 7 of 7 cycles Administration: 0.25 mg (01/29/2019), 0.25 mg (02/26/2019), 0.25 mg (03/05/2019), 0.25 mg (02/05/2019), 0.25 mg (03/12/2019), 0.25 mg (02/12/2019), 0.25 mg (02/19/2019) CARBOplatin (PARAPLATIN) 220 mg in sodium chloride 0.9 % 250 mL chemo infusion, 220 mg (100 % of original dose 222.2 mg), Intravenous,  Once, 7 of 7 cycles Dose modification: 222.2 mg (original dose 222.2 mg, Cycle 1) Administration: 220 mg (01/29/2019), 220 mg (02/26/2019), 220 mg (03/05/2019), 220 mg (03/12/2019), 220 mg (02/12/2019), 220 mg (02/19/2019) PACLitaxel (TAXOL) 84 mg in sodium chloride 0.9 % 250 mL chemo infusion (</= 80mg /m2), 45 mg/m2 = 84 mg, Intravenous,  Once, 7 of 7 cycles Administration: 84 mg (01/29/2019), 84 mg (02/26/2019), 84 mg (03/05/2019), 84 mg (02/05/2019), 84 mg (03/12/2019), 84 mg (02/12/2019), 84 mg (02/19/2019)  for chemotherapy treatment.    05/22/2019 -  Chemotherapy   The patient had palonosetron (ALOXI) injection 0.25 mg, 0.25 mg, Intravenous,  Once, 4 of 4 cycles Administration: 0.25 mg (05/22/2019), 0.25 mg (07/03/2019), 0.25 mg (07/24/2019) PEMEtrexed (ALIMTA) 900 mg in sodium chloride 0.9 % 100 mL chemo infusion, 517 mg/m2 = 850 mg, Intravenous,  Once, 4  of 6 cycles Administration: 900 mg (05/22/2019), 900 mg (07/03/2019), 800 mg (07/24/2019), 900 mg (06/12/2019) CARBOplatin (PARAPLATIN) 520 mg in sodium chloride 0.9 % 250 mL chemo infusion, 520 mg (100 % of original dose 517.5 mg), Intravenous,  Once, 4 of 4 cycles Dose modification: 517.5 mg (original dose 517.5 mg, Cycle 1), 517.5 mg (original dose 517.5 mg, Cycle 3), 517.5 mg (original dose 517.5 mg, Cycle 4), 517.5 mg (original dose 517.5 mg, Cycle 2) Administration: 520 mg (05/22/2019), 520 mg (07/03/2019), 520 mg (07/24/2019), 520 mg (06/12/2019) pembrolizumab (KEYTRUDA) 200 mg in sodium chloride 0.9 % 50 mL chemo infusion, 200 mg, Intravenous, Once, 4 of 6 cycles Administration: 200 mg (05/22/2019), 200 mg (07/03/2019), 200 mg (07/24/2019), 200 mg (06/12/2019) fosaprepitant (EMEND) 150 mg, dexamethasone (DECADRON) 12 mg in sodium chloride 0.9 % 145 mL IVPB, , Intravenous,  Once, 4 of 4 cycles Administration:  (05/22/2019),  (07/03/2019),  (07/24/2019),  (06/12/2019)  for chemotherapy treatment.    Adenocarcinoma of right lung, stage 4 ([place])  05/15/2019 Initial Diagnosis   Adenocarcinoma of right lung, stage 4 ([place])   05/22/2019 -  Chemotherapy   The patient had palonosetron (ALOXI) injection 0.25 mg, 0.25 mg, Intravenous,  Once, 4 of 4 cycles Administration: 0.25 mg (05/22/2019), 0.25 mg (07/03/2019), 0.25 mg (07/24/2019) PEMEtrexed (ALIMTA) 900 mg in sodium chloride 0.9 % 100 mL chemo infusion, 517 mg/m2 = 850 mg, Intravenous,  Once, 4 of 6 cycles Administration: 900 mg (05/22/2019), 900 mg (07/03/2019), 800 mg (07/24/2019), 900 mg (06/12/2019) CARBOplatin (PARAPLATIN) 520 mg in sodium chloride 0.9 % 250 mL  chemo infusion, 520 mg (100 % of original dose 517.5 mg), Intravenous,  Once, 4 of 4 cycles Dose modification: 517.5 mg (original dose 517.5 mg, Cycle 1), 517.5 mg (original dose 517.5 mg, Cycle 3), 517.5 mg (original dose 517.5 mg, Cycle 4), 517.5 mg (original dose 517.5 mg, Cycle  2) Administration: 520 mg (05/22/2019), 520 mg (07/03/2019), 520 mg (07/24/2019), 520 mg (06/12/2019) pembrolizumab (KEYTRUDA) 200 mg in sodium chloride 0.9 % 50 mL chemo infusion, 200 mg, Intravenous, Once, 4 of 6 cycles Administration: 200 mg (05/22/2019), 200 mg (07/03/2019), 200 mg (07/24/2019), 200 mg (06/12/2019) fosaprepitant (EMEND) 150 mg, dexamethasone (DECADRON) 12 mg in sodium chloride 0.9 % 145 mL IVPB, , Intravenous,  Once, 4 of 4 cycles Administration:  (05/22/2019),  (07/03/2019),  (07/24/2019),  (06/12/2019)  for chemotherapy treatment.       REVIEW OF SYSTEMS:   Noncontributory except as noted in the HPI.  I have reviewed the past medical history, past surgical history, social history and family history with the patient and they are unchanged from previous note.   PHYSICAL EXAMINATION: ECOG PERFORMANCE STATUS: 3 - Symptomatic, >50% confined to bed  Vitals:   08/13/19 0436 08/13/19 0855  BP: (!) 113/94 106/80  Pulse: (!) 108 (!) 126  Resp: 20   Temp: (!) 97.4 F (36.3 C)   SpO2: 98% 100%   Filed Weights   08/11/19 2241 08/12/19 0500 08/13/19 0502  Weight: 118 lb 11.2 oz (53.8 kg) 119 lb 12.8 oz (54.3 kg) 116 lb 12.8 oz (53 kg)    Intake/Output from previous day: 01/31 0701 - 02/01 0700 In: 1522.8 [P.O.:360; Blood:708; IV Piggyback:454.8] Out: 2297 [Urine:1750]  GENERAL: Alert, cachectic  SKIN: Pressure ulcer noted on coccyx LUNGS: clear to auscultation and percussion with normal breathing effort HEART: regular rate & rhythm and no murmurs and no lower extremity edema ABDOMEN:abdomen soft, non-tender and normal bowel sounds  NEURO: alert & oriented x 3 with fluent speech, no focal motor/sensory deficits  LABORATORY DATA:  I have reviewed the data as listed CMP Latest Ref Rng & Units 08/13/2019 08/12/2019 08/11/2019  Glucose 70 - 99 mg/dL 83 96 82  BUN 6 - 20 mg/dL 15 16 19   Creatinine 0.61 - 1.24 mg/dL 1.28(H) 1.14 1.11  Sodium 135 - 145 mmol/L 138 138 140   Potassium 3.5 - 5.1 mmol/L 3.1(L) 3.3(L) 3.5  Chloride 98 - 111 mmol/L 97(L) 98 99  CO2 22 - 32 mmol/L 30 31 31   Calcium 8.9 - 10.3 mg/dL 7.2(L) 7.5(L) 7.9(L)  Total Protein 6.5 - 8.1 g/dL - 6.2(L) -  Total Bilirubin 0.3 - 1.2 mg/dL - 0.6 -  Alkaline Phos 38 - 126 U/L - 80 -  AST 15 - 41 U/L - 30 -  ALT 0 - 44 U/L - 13 -    Lab Results  Component Value Date   WBC 9.3 08/13/2019   HGB 11.5 (L) 08/13/2019   HCT 34.6 (L) 08/13/2019   MCV 88.5 08/13/2019   PLT 42 (L) 08/13/2019   NEUTROABS 5.6 08/13/2019    CT Chest W Contrast  Result Date: 07/23/2019 CLINICAL DATA:  Stage IV right lower lobe lung adenocarcinoma diagnosed July 2020 status post concurrent chemoradiation therapy with ongoing chemotherapy and immunotherapy. Restaging. EXAM: CT CHEST, ABDOMEN, AND PELVIS WITH CONTRAST TECHNIQUE: Multidetector CT imaging of the chest, abdomen and pelvis was performed following the standard protocol during bolus administration of intravenous contrast. CONTRAST:  75mL OMNIPAQUE IOHEXOL 300 MG/ML  SOLN COMPARISON:  04/29/2019 chest CT.  12/27/2018 PET-CT. FINDINGS: CT CHEST FINDINGS Cardiovascular: Normal heart size. No significant pericardial effusion/thickening. Left circumflex and right coronary atherosclerosis. Mildly atherosclerotic nonaneurysmal thoracic aorta. Normal caliber pulmonary arteries. No central pulmonary emboli. Mediastinum/Nodes: No discrete thyroid nodules. Unremarkable esophagus. No pathologically enlarged axillary, mediastinal or hilar lymph nodes. Lungs/Pleura: No pneumothorax. Small loculated basilar right pleural effusion, slightly increased from 04/29/2019. Interval removal right pleural chest tube. No left pleural effusion. Mild centrilobular and paraseptal emphysema. The previously visualized extensive consolidation and patchy ground-glass opacity throughout the left lung on 04/29/2019 chest CT has essentially resolved. Extensive irregular masslike consolidation throughout  the right lower lobe measures up to 9.8 x 7.4 cm in maximum axial dimensions and is somewhat sharply marginated (series 4/image 101), increased from 8.0 x 5.8 cm on 04/29/2019. Evolving sharply marginated mild right perihilar radiation fibrosis. New cavitary 2.9 x 2.4 cm focus in the medial right lower lobe (series 4/image 106) with air-fluid level. Dominant 0.8 cm solid apical right upper lobe nodule (series 4/image 27), stable. Additional scattered subcentimeter indistinct sub solid nodules in both lungs are not appreciably changed. No definite new pulmonary nodules. Musculoskeletal: No aggressive appearing focal osseous lesions. Stable small bone island in T3 vertebral body. CT ABDOMEN PELVIS FINDINGS Hepatobiliary: Normal liver with no liver mass. Cholelithiasis. No biliary ductal dilatation. Pancreas: Normal, with no mass or duct dilation. Spleen: Normal size. No mass. Adrenals/Urinary Tract: Normal adrenals. No hydronephrosis. Heterogeneous enhancement of the renal parenchyma in both kidneys without discrete renal masses. Subcentimeter hypodense lower left renal cortical lesion is too small to characterize and unchanged since 12/27/2018 PET-CT, considered benign. Normal bladder. Stomach/Bowel: Normal non-distended stomach. Normal caliber small bowel with no small bowel wall thickening. Appendix not discretely visualized. Moderate sigmoid diverticulosis, with no large bowel wall thickening or significant pericolonic fat stranding. Oral contrast transits to the rectum. Vascular/Lymphatic: Atherosclerotic nonaneurysmal abdominal aorta. Patent portal, splenic, hepatic and renal veins. No pathologically enlarged lymph nodes in the abdomen or pelvis. Reproductive: Normal size prostate. Other: No pneumoperitoneum. No focal fluid collection. Small volume pelvic ascites. Musculoskeletal: No aggressive appearing focal osseous lesions. Mild lumbar spondylosis. IMPRESSION: 1. Extensive irregular masslike consolidation  replacing much of the right lower lobe, somewhat sharply marginated, mildly increased since 04/29/2019 chest CT, indeterminate for evolving radiation fibrosis versus viable tumor. New cavitary focus within the medial right lower lobe with air-fluid level. PET-CT could be considered for further evaluation versus close chest CT follow-up in 3 months. 2. Scattered ill-defined subcentimeter pulmonary nodules are not definitely changed. Previously visualized left lung opacities have largely resolved. 3. No pathologically enlarged thoracic nodes. No evidence of metastatic disease in the abdomen or pelvis. 4. Small loculated right pleural effusion. Interval removal of right chest tube. 5. Small volume pelvic ascites. 6. Nonspecific heterogeneous enhancement of the kidneys bilaterally. No hydronephrosis. Findings could represent medical renal disease or pyelonephritis. No evidence of renal abscess. 7. Aortic Atherosclerosis (ICD10-I70.0) and Emphysema (ICD10-J43.9). These results will be called to the ordering clinician or representative by the Radiologist Assistant, and communication documented in the PACS or zVision Dashboard. Electronically Signed   By: Ilona Sorrel M.D.   On: 07/23/2019 11:09   CT Angio Chest PE W and/or Wo Contrast  Result Date: 08/11/2019 CLINICAL DATA:  Hemoptysis. Dizziness. History of lung cancer. Concern for pulmonary embolism. Known right lower lobe stage IV adenocarcinoma, status post chemotherapy and radiation therapy in July 2020. EXAM: CT ANGIOGRAPHY CHEST WITH CONTRAST TECHNIQUE: Multidetector CT imaging of the chest was performed using the standard  protocol during bolus administration of intravenous contrast. Multiplanar CT image reconstructions and MIPs were obtained to evaluate the vascular anatomy. CONTRAST:  51mL OMNIPAQUE IOHEXOL 350 MG/ML SOLN COMPARISON:  08/11/2019 chest radiograph.  CT chest 07/23/2019. FINDINGS: Cardiovascular: The quality of this exam for evaluation of  pulmonary embolism is good. No evidence of pulmonary embolism. Aortic and branch vessel atherosclerosis. Tortuous thoracic aorta. Moderate cardiomegaly, without pericardial effusion. Left circumflex coronary artery atherosclerosis. Mediastinum/Nodes: Suggestion of soft tissue fullness within the inferior mediastinum, including within the subcarinal space on 79/4. This extends into the peribronchovascular soft tissues surrounding the right lower lobe bronchi, including on 96/4. No well-defined adenopathy. Lungs/Pleura: Similar small right pleural effusion. New trace left pleural fluid. Similar mass-effect upon right middle and right lower lobe bronchi. Biapical pleuroparenchymal scarring. Pulmonary nodules, within example 7 mm right apical nodule on 51/10 measuring similar on the prior CT. Redemonstration of right lower lung consolidation, ground-glass, and airspace disease. This is slightly increased, including within the posterolateral right middle and anterolateral right lower lobes. New left lower lobe airspace and ground-glass opacity. Underlying centrilobular and paraseptal emphysema. Upper Abdomen: Possible trace perihepatic ascites. Normal imaged portions of the spleen, stomach, pancreas, adrenal glands, kidneys. Musculoskeletal: No acute osseous abnormality. Review of the MIP images confirms the above findings. IMPRESSION: 1.  No evidence of pulmonary embolism. 2. Worsened right lower lung aeration in the setting of consolidation, airspace, and ground-glass opacity. New left lower lobe consolidation and airspace disease. Presuming no recent radiation therapy, considerations include progressive infection/aspiration or tumor in this patient with a history of stage IV adenocarcinoma. 3. Developing soft tissue fullness within the inferior mediastinum and right infrahilar region. Similarly, this could be radiation induced or represent tumor spread. No well-defined adenopathy. 4. Similar small right and  development of trace left pleural fluid. 5. Aortic atherosclerosis (ICD10-I70.0), coronary artery atherosclerosis and emphysema (ICD10-J43.9). 6. Pulmonary nodules, as before. 7. Consider 6-12 week follow-up CT, possibly after antibiotic therapy. Electronically Signed   By: Abigail Miyamoto M.D.   On: 08/11/2019 18:35   CT Abdomen Pelvis W Contrast  Result Date: 07/23/2019 CLINICAL DATA:  Stage IV right lower lobe lung adenocarcinoma diagnosed July 2020 status post concurrent chemoradiation therapy with ongoing chemotherapy and immunotherapy. Restaging. EXAM: CT CHEST, ABDOMEN, AND PELVIS WITH CONTRAST TECHNIQUE: Multidetector CT imaging of the chest, abdomen and pelvis was performed following the standard protocol during bolus administration of intravenous contrast. CONTRAST:  71mL OMNIPAQUE IOHEXOL 300 MG/ML  SOLN COMPARISON:  04/29/2019 chest CT.  12/27/2018 PET-CT. FINDINGS: CT CHEST FINDINGS Cardiovascular: Normal heart size. No significant pericardial effusion/thickening. Left circumflex and right coronary atherosclerosis. Mildly atherosclerotic nonaneurysmal thoracic aorta. Normal caliber pulmonary arteries. No central pulmonary emboli. Mediastinum/Nodes: No discrete thyroid nodules. Unremarkable esophagus. No pathologically enlarged axillary, mediastinal or hilar lymph nodes. Lungs/Pleura: No pneumothorax. Small loculated basilar right pleural effusion, slightly increased from 04/29/2019. Interval removal right pleural chest tube. No left pleural effusion. Mild centrilobular and paraseptal emphysema. The previously visualized extensive consolidation and patchy ground-glass opacity throughout the left lung on 04/29/2019 chest CT has essentially resolved. Extensive irregular masslike consolidation throughout the right lower lobe measures up to 9.8 x 7.4 cm in maximum axial dimensions and is somewhat sharply marginated (series 4/image 101), increased from 8.0 x 5.8 cm on 04/29/2019. Evolving sharply marginated  mild right perihilar radiation fibrosis. New cavitary 2.9 x 2.4 cm focus in the medial right lower lobe (series 4/image 106) with air-fluid level. Dominant 0.8 cm solid apical right upper lobe nodule (  series 4/image 27), stable. Additional scattered subcentimeter indistinct sub solid nodules in both lungs are not appreciably changed. No definite new pulmonary nodules. Musculoskeletal: No aggressive appearing focal osseous lesions. Stable small bone island in T3 vertebral body. CT ABDOMEN PELVIS FINDINGS Hepatobiliary: Normal liver with no liver mass. Cholelithiasis. No biliary ductal dilatation. Pancreas: Normal, with no mass or duct dilation. Spleen: Normal size. No mass. Adrenals/Urinary Tract: Normal adrenals. No hydronephrosis. Heterogeneous enhancement of the renal parenchyma in both kidneys without discrete renal masses. Subcentimeter hypodense lower left renal cortical lesion is too small to characterize and unchanged since 12/27/2018 PET-CT, considered benign. Normal bladder. Stomach/Bowel: Normal non-distended stomach. Normal caliber small bowel with no small bowel wall thickening. Appendix not discretely visualized. Moderate sigmoid diverticulosis, with no large bowel wall thickening or significant pericolonic fat stranding. Oral contrast transits to the rectum. Vascular/Lymphatic: Atherosclerotic nonaneurysmal abdominal aorta. Patent portal, splenic, hepatic and renal veins. No pathologically enlarged lymph nodes in the abdomen or pelvis. Reproductive: Normal size prostate. Other: No pneumoperitoneum. No focal fluid collection. Small volume pelvic ascites. Musculoskeletal: No aggressive appearing focal osseous lesions. Mild lumbar spondylosis. IMPRESSION: 1. Extensive irregular masslike consolidation replacing much of the right lower lobe, somewhat sharply marginated, mildly increased since 04/29/2019 chest CT, indeterminate for evolving radiation fibrosis versus viable tumor. New cavitary focus within  the medial right lower lobe with air-fluid level. PET-CT could be considered for further evaluation versus close chest CT follow-up in 3 months. 2. Scattered ill-defined subcentimeter pulmonary nodules are not definitely changed. Previously visualized left lung opacities have largely resolved. 3. No pathologically enlarged thoracic nodes. No evidence of metastatic disease in the abdomen or pelvis. 4. Small loculated right pleural effusion. Interval removal of right chest tube. 5. Small volume pelvic ascites. 6. Nonspecific heterogeneous enhancement of the kidneys bilaterally. No hydronephrosis. Findings could represent medical renal disease or pyelonephritis. No evidence of renal abscess. 7. Aortic Atherosclerosis (ICD10-I70.0) and Emphysema (ICD10-J43.9). These results will be called to the ordering clinician or representative by the Radiologist Assistant, and communication documented in the PACS or zVision Dashboard. Electronically Signed   By: Ilona Sorrel M.D.   On: 07/23/2019 11:09   DG Chest Portable 1 View  Result Date: 08/11/2019 CLINICAL DATA:  Cough. History of lung cancer and COPD. Pneumonia. EXAM: PORTABLE CHEST 1 VIEW COMPARISON:  August 03, 2019 FINDINGS: Effusion and opacity remains in the right lower lung, mildly worsened in the interval. The left lung is clear. No pneumothorax. No change in the cardiomediastinal silhouette. IMPRESSION: Mild worsening of right-sided effusion and underlying opacity. Recommend follow-up to resolution. Electronically Signed   By: Dorise Bullion III M.D   On: 08/11/2019 16:09   DG Chest Port 1 View  Result Date: 08/03/2019 CLINICAL DATA:  Shortness of breath.  Hypoxia. EXAM: PORTABLE CHEST 1 VIEW COMPARISON:  07/31/2019.  CT 07/23/2019. FINDINGS: Mediastinum hilar structures normal. Persistent atelectasis and infiltrate right lung base. Persistent cavitary changes right lung base. Persistent right-sided pleural effusion. Similar findings noted on prior exam.  Symmetric single nodular opacities noted over the chest bilaterally consistent nipple shadows. Stable cardiomegaly. No acute bony abnormality. IMPRESSION: Persistent right base atelectasis and infiltrate. Persistent cavitary changes right lung base. Persistent right-sided pleural effusion. No significant interim change from prior exam. Electronically Signed   By: [place]   On: 08/03/2019 11:22   DG Chest Portable 1 View  Result Date: 07/31/2019 CLINICAL DATA:  Shortness of breath EXAM: PORTABLE CHEST 1 VIEW COMPARISON:  April 28, 2019 FINDINGS: There  is loculated effusion at the right base with atelectatic change in the right base. There is consolidation in the medial right base. Lungs elsewhere clear. Heart is upper normal in size with pulmonary vascularity normal. No adenopathy. No bone lesions. IMPRESSION: Loculated pleural effusion right base with consolidation medial right base and atelectatic change elsewhere in the right base. Left lung clear. Heart upper normal in size. Electronically Signed   By: Lowella Grip III M.D.   On: 07/31/2019 18:23    ASSESSMENT AND PLAN: 1.  Metastatic non-small cell lung cancer 2.  Hemoptysis, resolved 3.  Anemia 4.  Thrombocytopenia 5.  Odynophagia 6.  History of PE, Xarelto currently on hold 7.  Anorexia secondary to cancer and chemotherapy 8.  Protein calorie malnutrition 9.  Deconditioning 10.  Goals of care  -CT scans are reviewed and shows no area of consolidation -progressive infection/aspiration versus tumor.  I again discussed with the patient recommendations from Dr. Julien Nordmann for him to consider palliative care and hospice.  We discussed that his cancer is incurable. He has previously declined this.  He is now interested in palliative care.  I have also talked to him further about hospice, but he is undecided about this.  Recommend palliative care consult to continue goals of care discussion.   -The patient is indicated that he has  some difficulty taking care of himself in the home and does not have somebody who can help him.  He is not interested in going to a facility after discharge.  He is very clear that he wants to go home.  Unsure if he would qualify for an aide to help with ADLs. -Patient's hemoglobin and platelets have improved following transfusion.  Recommend close monitoring.  Transfuse PRBCs for hemoglobin less than 8 and transfuse platelets for platelet count less than 10,000. -Continue fluconazole and Magic mouthwash.  He is also receiving Chloraseptic as needed.  He was previously on viscous lidocaine which helped immensely.  I have ordered this. -Dietitian recommendations are currently pending.    LOS: 1 day   Mikey Bussing, DNP, AGPCNP-BC, AOCNP 08/13/19

## 2019-08-13 NOTE — Consult Note (Signed)
Consultation Note Date: 08/13/2019   Patient Name: Tyler [name]  DOB: October 19, 1960  MRN: [id number]  Age / Sex: 59 y.o., male  PCP: [name], MD Referring Physician: Terrilee Croak, MD  Reason for Consultation: Establishing goals of care and Hospice Evaluation  HPI/Patient Profile: 59 y.o. male  with past medical history of stage 4 [place] lung cancer, PE, atrial fibrillation on Xeralto who was admitted on 08/11/2019 with hemoptysis.  The patient's anticoagulation had been discontinued during his last hospitalization 08/03/19 - 08/10/19.  Unfortunately he took his anticoaguation (as part of his usual routine) and developed hemoptysis.  Oncology has evaluated the patient and recommended that he move forward with Hospice care.  Clinical Assessment and Goals of Care:  I have reviewed medical records including EPIC notes, labs and imaging, received report from Dr. Pietro Cassis, examined the patient and spoke on the phone with Jacquelyn to discuss diagnosis prognosis, [place], EOL wishes, disposition and options.  I introduced Palliative Medicine as specialized medical care for people living with serious illness. It focuses on providing relief from the symptoms and stress of a serious illness. Explained the difference between Hospice and Palliative Care in the home.  We discussed his current illness and what it means in the larger context of his on-going co-morbidities.  Natural disease trajectory and expectations at EOL were discussed.  Specifically Jori Moll and [place] asked questions around why chemotherapy was no longer being offered and what type of care he would receive going forward.  I answered these questions to the best of my ability.  Hospice and Palliative Care services outpatient were explained and offered.  Jori Moll and [place] agreed that they would like to have Hospice services in their home.  They live close to  [place].  Questions and concerns were addressed.  The family was encouraged to call with questions or concerns.    Primary Decision Maker:  PATIENT and his wife.    SUMMARY OF RECOMMENDATIONS    Home with hospice services ASAP. TOC order placed and Referral call made to [facility].  Code Status/Advance Care Planning:  Not discussed.   Can be discussed by Hospice once he is at home.   Psycho-social/Spiritual:   Desire for further Chaplaincy support: Welcomed.  Prognosis:  Less than 6 months given metastatic lung cancer no longer receiving chemotherapy.    Discharge Planning: Home with Hospice      Primary Diagnoses: Present on Admission: . Hemoptysis . Anemia . Thrombocytopenia ([place])   I have reviewed the medical record, interviewed the patient and family, and examined the patient. The following aspects are pertinent.  Past Medical History:  Diagnosis Date  . Alcohol abuse   . Chronic back pain   . COPD (chronic obstructive pulmonary disease) ([place])   . Dyspnea   . Lung mass    mediastinal adenopathy  . Lung nodule   . Multiple pulmonary nodules 04/02/2018  . Pneumonia   . Right lower lobe pulmonary nodule 04/02/2018   12/01/2017: PET SUV 2.1  .  Tobacco abuse    Social History   Socioeconomic History  . Marital status: Married    Spouse name: Not on file  . Number of children: Not on file  . Years of education: Not on file  . Highest education level: Not on file  Occupational History  . Not on file  Tobacco Use  . Smoking status: Current Every Day Smoker    Packs/day: 0.25    Years: 40.00    Pack years: 10.00    Types: Cigarettes  . Smokeless tobacco: Never Used  Substance and Sexual Activity  . Alcohol use: Not Currently  . Drug use: Not Currently    Types: Marijuana  . Sexual activity: Not on file  Other Topics Concern  . Not on file  Social History Narrative  . Not on file   Social Determinants of Health    Financial Resource Strain:   . Difficulty of Paying Living Expenses: Not on file  Food Insecurity:   . Worried About Charity fundraiser in the Last Year: Not on file  . Ran Out of Food in the Last Year: Not on file  Transportation Needs: Unmet Transportation Needs  . Lack of Transportation (Medical): Yes  . Lack of Transportation (Non-Medical): Yes  Physical Activity:   . Days of Exercise per Week: Not on file  . Minutes of Exercise per Session: Not on file  Stress:   . Feeling of Stress : Not on file  Social Connections:   . Frequency of Communication with Friends and Family: Not on file  . Frequency of Social Gatherings with Friends and Family: Not on file  . Attends Religious Services: Not on file  . Active Member of Clubs or Organizations: Not on file  . Attends Archivist Meetings: Not on file  . Marital Status: Not on file   Family History  Problem Relation Age of Onset  . Heart attack Maternal Grandfather    Scheduled Meds: . feeding supplement (ENSURE ENLIVE)  237 mL Oral Daily  . feeding supplement (PRO-STAT SUGAR FREE 64)  30 mL Oral BID  . fluconazole  200 mg Oral Daily  . folic acid  1 mg Oral Daily  . magnesium oxide  400 mg Oral BID  . metoprolol tartrate  25 mg Oral BID  . multivitamin with minerals  1 tablet Oral Daily  . potassium chloride SA  20 mEq Oral BID  . vancomycin variable dose per unstable renal function (pharmacist dosing)   Does not apply See admin instructions   Continuous Infusions: . sodium chloride Stopped (08/11/19 2026)  . piperacillin-tazobactam (ZOSYN)  IV 3.375 g (08/13/19 1350)  . vancomycin     PRN Meds:.acetaminophen **OR** acetaminophen, albuterol, guaiFENesin-dextromethorphan, HYDROmorphone (DILAUDID) injection, lidocaine, magic mouthwash, ondansetron (ZOFRAN) IV, phenol, senna-docusate No Known Allergies Review of Systems No complaints of further bleeding.  Throat is dry and sore.  Physical Exam  Thin frail male.   Awake, orientated, appropriate, coherent, appreciative.  Vital Signs: BP 120/83 (BP Location: Left Arm)   Pulse (!) 110   Temp 98.4 F (36.9 C) (Oral)   Resp 16   Ht 6\' 2"  (1.88 m)   Wt 53 kg   SpO2 100%   BMI 15.00 kg/m  Pain Scale: 0-10   Pain Score: 8    SpO2: SpO2: 100 % O2 Device:SpO2: 100 % O2 Flow Rate: .O2 Flow Rate (L/min): 2 L/min  IO: Intake/output summary:   Intake/Output Summary (Last 24 hours) at 08/13/2019 1642  Last data filed at 08/13/2019 1300 Gross per 24 hour  Intake 598.68 ml  Output 1625 ml  Net -1026.32 ml    LBM: Last BM Date: 08/13/19 Baseline Weight: Weight: 53.8 kg Most recent weight: Weight: 53 kg     Palliative Assessment/Data:  40%     Time In: 4:00 Time Out: 4:40 Time Total: 40 min Visit consisted of counseling and education dealing with the complex and emotionally intense issues surrounding the need for palliative care and symptom management in the setting of serious and potentially life-threatening illness. Greater than 50%  of this time was spent counseling and coordinating care related to the above assessment and plan.  Signed by: Florentina Jenny, PA-C Palliative Medicine Pager: [phone]  Please contact Palliative Medicine Team phone at [phone] for questions and concerns.  For individual provider: See Shea Evans

## 2019-08-13 NOTE — Evaluation (Signed)
Physical Therapy Evaluation Patient Details Name: Tyler [name] MRN: [id number] DOB: September 10, 1960 Today's Date: 08/13/2019   History of Present Illness  59 yo male admitted with hemoptysis, pancytopenia. Hx of stage IV lung ca, PE, Afib, ETOH abuse.  Clinical Impression  Bed level eval only. Pt was not agreeable to mobilizing despite encouragement and explanation/importance of PT assessment. Per chart, pt is awaiting palliative care consult. Will follow and await palliative recommendations.     Follow Up Recommendations SNF    Equipment Recommendations  None recommended by PT    Recommendations for Other Services Other (comment)(Palliative)     Precautions / Restrictions Precautions Precautions: Fall Restrictions Weight Bearing Restrictions: No      Mobility  Bed Mobility               General bed mobility comments: NT-pt not agreeable to attempting any mobility despite encouragement  Transfers                    Ambulation/Gait                Stairs            Wheelchair Mobility    Modified Rankin (Stroke Patients Only)       Balance                                             Pertinent Vitals/Pain Pain Assessment: Faces Faces Pain Scale: No hurt    Home Living Family/patient expects to be discharged to:: Unsure Living Arrangements: Spouse/significant other;Children   Type of Home: Apartment Home Access: Stairs to enter   [employer] of Steps: 1 Home Layout: One level Home Equipment: Cane - single point;Walker - 2 wheels      Prior Function Level of Independence: Independent with assistive device(s)         Comments: Using RW most recently. Having difficulty ambulating. Near falls at home.     Hand Dominance   Dominant Hand: Right    Extremity/Trunk Assessment   Upper Extremity Assessment Upper Extremity Assessment: Generalized weakness(Strength ~3+/5 throughout)    Lower  Extremity Assessment Lower Extremity Assessment: Generalized weakness(Strength ~3+/5 throughout)       Communication   Communication: No difficulties  Cognition Arousal/Alertness: Awake/alert Behavior During Therapy: WFL for tasks assessed/performed Overall Cognitive Status: Within Functional Limits for tasks assessed                                        General Comments      Exercises     Assessment/Plan    PT Assessment Patient needs continued PT services  PT Problem List Decreased strength;Decreased mobility;Decreased activity tolerance;Decreased balance;Decreased knowledge of use of DME;Decreased range of motion       PT Treatment Interventions DME instruction;Gait training;Therapeutic activities;Therapeutic exercise;Patient/family education;Balance training;Functional mobility training    PT Goals (Current goals can be found in the Care Plan section)  Acute Rehab PT Goals Patient Stated Goal: none stated PT Goal Formulation: With patient Time For Goal Achievement: 08/27/19 Potential to Achieve Goals: Fair    Frequency Min 2X/week   Barriers to discharge Decreased caregiver support      Co-evaluation  AM-PAC PT "6 Clicks" Mobility  Outcome Measure Help needed turning from your back to your side while in a flat bed without using bedrails?: Total Help needed moving from lying on your back to sitting on the side of a flat bed without using bedrails?: Total Help needed moving to and from a bed to a chair (including a wheelchair)?: Total Help needed standing up from a chair using your arms (e.g., wheelchair or bedside chair)?: Total Help needed to walk in hospital room?: Total Help needed climbing 3-5 steps with a railing? : Total 6 Click Score: 6    End of Session     Patient left: in bed;with call bell/phone within reach(with IV nurse in room working with PT)   PT Visit Diagnosis: Muscle weakness (generalized)  (M62.81);Other abnormalities of gait and mobility (R26.89)    Time: 1140-1148 PT Time Calculation (min) (ACUTE ONLY): 8 min   Charges:   PT Evaluation $PT Eval Moderate Complexity: 1 Mod             Kale Rondeau P, PT Acute Rehabilitation

## 2019-08-13 NOTE — Progress Notes (Signed)
PROGRESS NOTE  Tyler [name]  DOB: 03-06-1961  PCP: [name], MD XIP:[id number]  DOA: 08/11/2019 Admitted From: Home  LOS: 1 day   Chief Complaint  Patient presents with  . Hemoptysis   Brief narrative: Tyler [name] a 59 y.o.malewith medical history significant ofpermanent atrial fibrillation, stage IV non-small cell lung cancer/adenocarcinoma on current chemotherapy, history of pulmonary embolism, EtOH abuse, tobacco abuse. Patient was recently hospitalized from 1/22 to 1/29 for Neutropenic fever, right-sided pneumonia, respiratory failure, inability to swallow.  He was on chronic Xarelto because of history of A. fib as well as pulmonary embolism.  In last admission, because of his pancytopenia, he required multiple units of PRBC transfusion as well as platelet transfusion.  On the day of discharge his platelet count was 10 and he received 1 unit of platelet before discharge.  At discharge, Xarelto was stopped.  Palliative care consultation was obtained.  Patient continued to chose aggressive care.  Patient was sent home with home health PT and had an appointment with oncology on 2/2. However, patient returned back to the ED within 48 hours on 1/30 with complaint of hemoptysis.  In the ED, patient was afebrile, hemodynamically stable, O2 sat 100% on 2 L by nasal cannula. Blood work showed WBC 9.1, hemoglobin 7.4, platelet 11. CTA chest showed no pulmonary medicine but worse than right lower lung radiation, suggestive of aspiration.  It also showed developing soft tissue fullness within the inferomedial sternum and right infrahilar region suggestive of tumor spread. Patient was ordered for transfusion of 2 units of PRBC and 1 unit of platelet. Patient was admitted to hospitalist medicine service for further evaluation management.  Subjective: Patient was seen and examined this morning.   Remains weak.  Hemoglobin and platelet count better after transfusions yesterday.    No further report of hemoptysis.   -Patient had discussion with oncologist morning.  He understands his poor prognosis.  Advised to be seen by palliative for possible home hospice  Assessment/Plan: Acute hemoptysis -Likely related to worsening lung cancer status and low platelets. -Off Xarelto since last admission. -No further episode of hemoptysis since ED arrival.  Pancytopenia -Related to recent chemotherapy.  Last admission while patient got Granix.   -1/31, patient received 2 units of PRBC and 1 unit of platelets, improved counts today.   Stage IV adenocarcinoma right lung -Patient is followed up by medical oncology Dr. [name]. -After discussion with oncology this morning, patient chose to go for palliative route. Palliative care consultation called.  Odynophagia -Continues to have painful swallowing.  In last admission he was suspected to have mucositis versus esophageal candidiasis  -He continues to be on fluconazole, Magic mouthwash and viscous lidocaine. -Limited inability to swallow.  Permanent atrial fibrillation -Continue metoprolol 25 mg twice daily -Xarelto has been stopped.  History of pulmonary embolism -Off Xarelto. CT angio of chest in this admission did not show any new pulmonary embolism.    Malnutrition - Body mass index is 15 kg/m. Nutrition consulted.  Diet: Encourage regular diet as tolerated Fluid: not on IV fluid DVT prophylaxis: SCDs Code Status:  Palliative care consulted.  Consultants:  Oncology, palliative care  Procedures:  None  Antimicrobials: Anti-infectives (From admission, onward)   Start     Dose/Rate Route Frequency Ordered Stop   08/13/19 2200  vancomycin (VANCOREADY) IVPB 750 mg/150 mL     750 mg 150 mL/hr over 60 Minutes Intravenous  Once 08/13/19 1239     08/13/19 1238  vancomycin variable dose  per unstable renal function (pharmacist dosing)      Does not apply See admin instructions 08/13/19 1239     08/12/19  1000  fluconazole (DIFLUCAN) tablet 200 mg     200 mg Oral Daily 08/11/19 2231     08/12/19 1000  vancomycin (VANCOREADY) IVPB 750 mg/150 mL  Status:  Discontinued     750 mg 150 mL/hr over 60 Minutes Intravenous Every 12 hours 08/12/19 0603 08/13/19 1204   08/11/19 2245  piperacillin-tazobactam (ZOSYN) IVPB 3.375 g     3.375 g 12.5 mL/hr over 240 Minutes Intravenous Every 8 hours 08/11/19 2235     08/11/19 2245  vancomycin (VANCOCIN) IVPB 1000 mg/200 mL premix     1,000 mg 200 mL/hr over 60 Minutes Intravenous  Once 08/11/19 2235 08/12/19 0059        Code Status: Full Code   Diet Order            Diet regular Room service appropriate? Yes; Fluid consistency: Thin  Diet effective now              Infusions:  . sodium chloride Stopped (08/11/19 2026)  . piperacillin-tazobactam (ZOSYN)  IV 3.375 g (08/13/19 1350)  . vancomycin      Scheduled Meds: . feeding supplement (ENSURE ENLIVE)  237 mL Oral Daily  . feeding supplement (PRO-STAT SUGAR FREE 64)  30 mL Oral BID  . fluconazole  200 mg Oral Daily  . folic acid  1 mg Oral Daily  . magnesium oxide  400 mg Oral BID  . metoprolol tartrate  25 mg Oral BID  . multivitamin with minerals  1 tablet Oral Daily  . potassium chloride SA  20 mEq Oral BID  . vancomycin variable dose per unstable renal function (pharmacist dosing)   Does not apply See admin instructions    PRN meds: acetaminophen **OR** acetaminophen, albuterol, guaiFENesin-dextromethorphan, HYDROmorphone (DILAUDID) injection, lidocaine, magic mouthwash, ondansetron (ZOFRAN) IV, phenol, senna-docusate   Objective: Vitals:   08/13/19 0855 08/13/19 1430  BP: 106/80 120/83  Pulse: (!) 126 (!) 110  Resp:  16  Temp:  98.4 F (36.9 C)  SpO2: 100% 100%    Intake/Output Summary (Last 24 hours) at 08/13/2019 1442 Last data filed at 08/13/2019 0918 Gross per 24 hour  Intake 574.83 ml  Output 1525 ml  Net -950.17 ml   Filed Weights   08/11/19 2241 08/12/19 0500  08/13/19 0502  Weight: 53.8 kg 54.3 kg 53 kg   Weight change: -0.862 kg Body mass index is 15 kg/m.   Physical Exam: General exam: Propped up in bed Skin: No rashes, lesions or ulcers. HEENT: Atraumatic, normocephalic, supple neck, no obvious bleeding Lungs: Mild scattered rales bilaterally, mild wet cough CVS: Regular rate and rhythm, no murmur GI/Abd soft, nontender, nondistended, bowel sound present CNS: Alert, awake, oriented x3 Psychiatry: Mood seems depressed Extremities: No pedal edema, no calf tenderness  Data Review: I have personally reviewed the laboratory data and studies available.  Recent Labs  Lab 08/08/19 0256 08/08/19 0256 08/09/19 0250 08/10/19 0327 08/11/19 1703 08/12/19 0017 08/13/19 0456  WBC 2.2*   < > 5.0 8.3 9.1 9.5 9.3  NEUTROABS 1.3*  --  3.5 5.9 5.9  --  5.6  HGB 8.0*   < > 8.1* 8.1* 7.4* 7.1* 11.5*  HCT 25.1*   < > 25.6* 26.9* 23.7* 22.9* 34.6*  MCV 86.0   < > 87.7 89.4 87.5 87.7 88.5  PLT 19*   < > 13* 10*  11* 13* 42*   < > = values in this interval not displayed.   Recent Labs  Lab 08/09/19 0250 08/10/19 0327 08/11/19 1703 08/12/19 0017 08/13/19 0456  NA 138 139 140 138 138  K 3.9 3.7 3.5 3.3* 3.1*  CL 97* 100 99 98 97*  CO2 32 31 31 31 30   GLUCOSE 88 87 82 96 83  BUN 26* 22* 19 16 15   CREATININE 1.29* 1.09 1.11 1.14 1.28*  CALCIUM 8.0* 8.1* 7.9* 7.5* 7.2*    Terrilee Croak, MD  [facility] 08/13/2019

## 2019-08-13 NOTE — Progress Notes (Signed)
Pharmacy Antibiotic Note  Tyler [name] is a 59 y.o. male with lung cancer currently undergoing chemotherapy treatment presented to the ED on 08/11/2019 with c/o hemoptysis, fatigue and poor oral intake. He was started on vancomycin and zosyn for suspected PNA.  Today, 08/13/2019: - day #2 abx - Tmax 100.4, wbc wnl - scr trending up 1.28 (crcl~46) - vancomycin random level came elevated at 24 (~14 hrs after last dose)  Plan: - with changing renal function and elevated vancomycin level, will give vancomycin 750 mg IV x1 tonight at 2200. F/u with renal function (scr) on 2/2. Will plan to recheck level in ~24 hrs and redose if level is < 20 ______________________________________ Height: 6\' 2"  (188 cm) Weight: 116 lb 12.8 oz (53 kg) IBW/kg (Calculated) : 82.2  Temp (24hrs), Avg:99 F (37.2 C), Min:97.4 F (36.3 C), Max:100.4 F (38 C)  Recent Labs  Lab 08/09/19 0250 08/10/19 0327 08/11/19 1703 08/12/19 0017 08/13/19 0456  WBC 5.0 8.3 9.1 9.5 9.3  CREATININE 1.29* 1.09 1.11 1.14 1.28*    Estimated Creatinine Clearance: 46 mL/min (A) (by C-G formula based on SCr of 1.28 mg/dL (H)).    No Known Allergies    Thank you for allowing pharmacy to be a part of this patient's care.  Lynelle Doctor 08/13/2019 11:23 AM

## 2019-08-13 NOTE — Progress Notes (Addendum)
Initial Nutrition Assessment  DOCUMENTATION CODES:   Severe malnutrition in context of chronic illness, Underweight  INTERVENTION:  - continue Ensure Enlive BID, each supplement provides 350 kcal and 20 grams of protein. - will order Magic Cup BID with meals, each supplement provides 290 kcal and 9 grams of protein. - will order 30 mL Prostat BID, each supplement provides 100 kcal and 15 grams of protein. - will order daily multivitamin with minerals.    NUTRITION DIAGNOSIS:   Severe Malnutrition related to chronic illness, cancer and cancer related treatments as evidenced by estimated needs.  GOAL:   Patient will meet greater than or equal to 90% of their needs  MONITOR:   PO intake, Supplement acceptance, Labs, Weight trends  REASON FOR ASSESSMENT:   Malnutrition Screening Tool, Consult Assessment of nutrition requirement/status  ASSESSMENT:   59 y.o. male with medical history of permanent Afib, mild to moderate MR, alcohol abuse, tobacco abuse, COPD,  h/o PE, stage 4 NSCLC (adenocarcinoma), and pancytopenia. He was recently admitted with PNA and neutropenic fever. He presented to the ED due to hemoptysis that started on the day of presentation. He also reported cough with yellow sputum, slight achiness, and some chest pain. He has chronic R-sided chest pain 2/2 lung cancer.  Patient has been refusing meals since admission. He did accept 1 bottle of Ensure earlier today; unsure how much was consumed. Patient followed by [facility] over the past 2 months and was last assessed by a inpatient RD on 1/24.   He reports that appetite and intakes have been poor over the past 2-3 weeks, but that prior to that he had a good appetite but was not eating well d/t difficulties chewing d/t poor dentition. He does best with softer items/items that require minimal chewing. He drinks Ensure Enlive at home and is open to receiving it during hospitalization. He does report that in the past  ~2 weeks he has begun to have pain when attempting to swallow; no items seem better or worse.  Per chart review, weight today is 117 lb which is consistent with weight on 07/03/19 and is up from weight recordings over the past 1 month.   Per notes: - pancytopenia - stage 4 adenocarcinoma of R lung, NSCLC - odynophagia--possible mucositis vs esophageal candidiasis; possible need for PEG once platelet count improves - permanent afib - Full Code     Labs reviewed; K: 3.1 mmol/l, Cl: 97 mmol/l, creatinine: 1.28 mg/dl, Ca: 7.2 mg/dl. Medications reviewed; 1 mg folvite/day, 400 mg mag-ox BID, 20 mEq Klor-Con BID.      NUTRITION - FOCUSED PHYSICAL EXAM:    Most Recent Value  Orbital Region  Moderate depletion  Upper Arm Region  Severe depletion  Thoracic and Lumbar Region  Severe depletion  Buccal Region  Moderate depletion  Temple Region  Moderate depletion  Clavicle Bone Region  Severe depletion  Clavicle and Acromion Bone Region  Severe depletion  Scapular Bone Region  Severe depletion  Dorsal Hand  Moderate depletion  Patellar Region  Severe depletion  Anterior Thigh Region  Severe depletion  Posterior Calf Region  Severe depletion  Edema (RD Assessment)  None  Hair  Reviewed  Eyes  Reviewed  Mouth  Reviewed  Skin  Reviewed  Nails  Reviewed       Diet Order:   Diet Order            Diet regular Room service appropriate? Yes; Fluid consistency: Thin  Diet effective now  EDUCATION NEEDS:   No education needs have been identified at this time  Skin:  Skin Assessment: Reviewed RN Assessment  Last BM:  2/1  Height:   Ht Readings from Last 1 Encounters:  08/11/19 6\' 2"  (1.88 m)    Weight:   Wt Readings from Last 1 Encounters:  08/13/19 53 kg    Ideal Body Weight:  86.4 kg  BMI:  Body mass index is 15 kg/m.  Estimated Nutritional Needs:   Kcal:  1855-2120 kcal  Protein:  105-120 grams  Fluid:  >/= 1.8 L/day     Jarome Matin,  MS, RD, LDN, [facility] Inpatient Clinical Dietitian Pager # [phone] After hours/weekend pager # [phone]

## 2019-08-14 ENCOUNTER — Telehealth: Payer: Self-pay | Admitting: Medical Oncology

## 2019-08-14 ENCOUNTER — Inpatient Hospital Stay: Payer: Medicaid Other

## 2019-08-14 ENCOUNTER — Inpatient Hospital Stay: Payer: Medicaid Other | Admitting: Physician Assistant

## 2019-08-14 MED ORDER — LOPERAMIDE HCL 2 MG PO CAPS: 2.0000 mg | ORAL_CAPSULE | Freq: Three times a day (TID) | ORAL | 0 refills | Status: AC | PRN

## 2019-08-14 MED ORDER — GUAIFENESIN-DM 100-10 MG/5ML PO SYRP: 5.0000 mL | ORAL_SOLUTION | ORAL | 0 refills | Status: AC | PRN

## 2019-08-14 MED ORDER — PHENOL 1.4 % MT LIQD: 1.0000 | OROMUCOSAL | 0 refills | Status: AC | PRN

## 2019-08-14 MED ORDER — SCOPOLAMINE 1 MG/3DAYS TD PT72: 1.0000 | MEDICATED_PATCH | TRANSDERMAL | 0 refills | Status: AC

## 2019-08-14 MED ORDER — MORPHINE SULFATE 20 MG/5ML PO SOLN: 2.5000 mg | ORAL | 0 refills | Status: AC | PRN

## 2019-08-14 MED ORDER — LOPERAMIDE HCL 2 MG PO CAPS: 2.0000 mg | ORAL_CAPSULE | Freq: Three times a day (TID) | ORAL | Status: DC | PRN

## 2019-08-14 NOTE — Progress Notes (Signed)
[facility]  Received request from Florentina Jenny, [place] for family interest in hospice services at home after discharge. Mr. Kean is a former [facility] patient. Chart reviewed and eligibility confirmed by hospice physician.   Spoke with spouse by phone to confirm interest, explain services, discuss DME, She is requesting RW, WC and oxygen setup. DME to be ordered this morning for delivery to address on facesheet. Spouse is contact, using home phone. Spouse was unaware of a discharge date during phone call.   Please send home with patient scripts for any medication he does not already have.   [facility] referral specialist will contact spouse to arrange first visit in the home after discharge.   Will update TOC manager this morning.   Thank you,  Erling Conte, LCSW [phone]  Hilma Favors are listed daily on [facility] under Hospice and [place]

## 2019-08-14 NOTE — TOC Progression Note (Signed)
Transition of Care [facility]) - Progression Note    Patient Details  Name: Tyler [name] MRN: [id number] Date of Birth: 07-04-1961  [facility] CM/SW Contact  Purcell Mouton, RN Phone Number: 08/14/2019, 9:52 AM  Clinical Narrative:    Pt discharging home with [facility]/Hospice.         Expected Discharge Plan and Services                                                 Social Determinants of Health (SDOH) Interventions    Readmission Risk Interventions No flowsheet data found.

## 2019-08-14 NOTE — Discharge Instructions (Signed)
Hospice Hospice is a service that is designed to provide people who are terminally ill and their families with medical, spiritual, and psychological support. Its aim is to improve your quality of life by keeping you as comfortable as possible in the final stages of life. Who will be my providers when I begin hospice care? Hospice teams often include:  A nurse.  A doctor. The hospice doctor will be available for your care, but you can include your regular doctor or nurse practitioner.  A social worker.  A counselor.  A religious leader (such as a chaplain).  A dietitian.  Therapists.  Trained volunteers who can help with care. What services does hospice provide? Hospice services can vary depending on the center or organization. Generally, they include:  Ways to keep you comfortable, such as: ? Providing care in your home or in a home-like setting. ? Working with your family and friends to help meet your needs. ? Allowing you to enjoy the support of loved ones by receiving much of your basic care from family and friends.  Pain relief and symptom management. The staff will supply all necessary medicines and equipment so that you can stay comfortable and alert enough to enjoy the company of your friends and family.  Visits or care from a nurse and doctor. This may include 24-hour on-call services.  Companionship when you are alone.  Allowing you and your family to rest. Hospice staff may do light housekeeping, prepare meals, and run errands.  Counseling. They will make sure your emotional, spiritual, and social needs are being met, as well as those needs of your family members.  Spiritual care. This will be individualized to meet your needs and your family's needs. It may involve: ? Helping you and your family understand the dying process. ? Helping you say goodbye to your family and friends. ? Performing a specific religious ceremony or ritual.  Massage.  Nutrition  therapy.  Physical and occupational therapy.  Short-term inpatient care, if something cannot be managed in the home.  Art or music therapy.  Bereavement support for grieving family members. When should hospice care begin? Most people who use hospice are believed to have less than 6 months to live.  Your family and health care providers can help you decide when hospice services should begin.  If you live longer than 6 months but your condition does not improve, your doctor may be able to approve you for continued hospice care.  If your condition improves, you may discontinue the program. What should I consider before selecting a program? Most hospice programs are run by nonprofit, independent organizations. Some are affiliated with hospitals, nursing homes, or home health care agencies. Hospice programs can take place in your home or at a hospice center, hospital, or skilled nursing facility. When choosing a hospice program, ask the following questions:  What services are available to me?  What services will be offered to my loved ones?  How involved will my loved ones be?  How involved will my health care provider be?  Who makes up the hospice care team? How are they trained or screened?  How will my pain and symptoms be managed?  If my circumstances change, can the services be provided in a different setting, such as my home or in the hospital?  Is the program reviewed and licensed by the state or certified in some other way?  What does it cost? Is it covered by insurance?  If I choose a hospice   center or nursing home, where is the hospice center located? Is it convenient for family and friends?  If I choose a hospice center or nursing home, can my family and friends visit any time?  Will you provide emotional and spiritual support?  Who can my family call with questions? Where can I learn more about hospice? You can learn about existing hospice programs in your area  from your health care providers. You can also read more about hospice online. The websites of the following organizations have helpful information:  [facility] and Palliative Care Organization [facility]): http://www.brown-buchanan.com/  National Association for [facility]): http://massey-hart.com/  Hospice Foundation of [place] ([place]): www.hospicefoundation.org  American Cancer Society (ACS): www.cancer.org  Hospice Net: www.hospicenet.org  Visiting Nurse Associations of [place] (VNAA): www.vnaa.org You may also find more information by contacting the following agencies:  A local agency on aging.  Your local [employer] chapter.  Your state's department of health or social services. Summary  Hospice is a service that is designed to provide people who are terminally ill and their families with medical, spiritual, and psychological support.  Hospice aims to improve your quality of life by keeping you as comfortable as possible in the final stages of life.  Hospice teams often include a doctor, nurse, social worker, counselor, religious leader,dietitian, therapists, and volunteers.  Hospice care generally includes medicine for symptom management, visits from doctors and nurses, physical and occupational therapy, nutrition counseling, spiritual and emotional counseling, caregiver support, and bereavement support for grieving family members.  Hospice programs can take place in your home or at a hospice center, hospital, or skilled nursing facility. This information is not intended to replace advice given to you by your health care provider. Make sure you discuss any questions you have with your health care provider. Document Revised: 03/21/2019 Document Reviewed: 07/20/2016 Elsevier Patient Education  [place].

## 2019-08-14 NOTE — Progress Notes (Signed)
   08/13/19 0855  Vitals  BP 106/80  MAP (mmHg) 89  BP Method Automatic  Pulse Rate (!) 126  Oxygen Therapy  SpO2 100 %  O2 Device Nasal Cannula  O2 Flow Rate (L/min) 2 L/min  MEWS yellow - not an acute change but heart rate up to 170 and back down quickly per CCMD.  Heart rate maintaining 120s and higher Afib.  Scheduled Metoprolol to be given.  Patient voices no complaints.  MD on unit and notified.  No new orders at this time.  Will continue to monitor.

## 2019-08-14 NOTE — Telephone Encounter (Signed)
I told Amy that Dr Julien Nordmann agreed to be attending.

## 2019-08-14 NOTE — Discharge Summary (Addendum)
Physician Discharge Summary  Tyler [name] DGL:[id number] DOB: Nov 13, 1960 DOA: 08/11/2019  PCP: [name], MD  Admit date: 08/11/2019 Discharge date: 08/14/2019  Admitted From: Home Discharge disposition: Home with hospice   Code Status: Full Code  Diet Recommendation: As tolerated   Recommendations for Outpatient Follow-Up:   1. Per hospice protocol  Discharge Diagnosis:   Principal Problem:   Hemoptysis Active Problems:   Anemia   Thrombocytopenia ([place])   Odynophagia   Metastatic non-small cell lung cancer [facility])   Palliative care encounter   Encounter for hospice care discussion  History of Present Illness / Brief narrative:  Tyler [name] a 59 y.o.malewith medical history significant ofpermanent atrial fibrillation, stage IV non-small cell lung cancer/adenocarcinoma on current chemotherapy, history of pulmonary embolism, EtOH abuse, tobacco abuse. Patient was recently hospitalized from 1/22 to 1/29 for Neutropenic fever, right-sided pneumonia, respiratory failure, inability to swallow.  He was on chronic Xarelto because of history of A. fib as well as pulmonary embolism.  In last admission, because of his pancytopenia, he required multiple units of PRBC transfusion as well as platelet transfusion.  On the day of discharge his platelet count was 10 and he received 1 unit of platelet before discharge.  At discharge, Xarelto was stopped.  Palliative care consultation was obtained.  Patient continued to chose aggressive care.  Patient was sent home with home health PT and had an appointment with oncology on 2/2. However, patient returned back to the ED within 48 hours on 1/30 with complaint of hemoptysis.  In the ED, patient was afebrile, hemodynamically stable, O2 sat 100% on 2 L by nasal cannula. Blood work showed WBC 9.1, hemoglobin 7.4, platelet 11. CTA chest showed no pulmonary medicine but worse than right lower lung radiation, suggestive of aspiration.  It  also showed developing soft tissue fullness within the inferomedial sternum and right infrahilar region suggestive of tumor spread. Patient was ordered for transfusion of 2 units of PRBC and 1 unit of platelet. Patient was admitted to hospitalist medicine service for further evaluation management.  Hospital Course:  Acute hemoptysis -Likely related to worsening lung cancer status and low platelets. -Off Xarelto since last admission. -No further episode of hemoptysis since ED arrival.  Pancytopenia -Related to recent chemotherapy.  Last admission while patient got Granix.   -1/31, patient received 2 units of PRBC and 1 unit of platelets  Stage IV adenocarcinoma right lung -Patient is followed up by medical oncology Dr. [name]. -After discussion with oncology this morning, patient chose to go for palliative route. Palliative care consultation called.  After discussion with patient and his wife, patient has chosen to go home with home hospice.  Odynophagia -Continues to have painful swallowing. In last admission he was suspected to have mucositis versus esophageal candidiasis  -He continues to be on fluconazole, Magic mouthwash and viscous lidocaine. -Limited inability to swallow.  Permanent atrial fibrillation -Continue metoprolol 25 mg twice daily -Xarelto has been stopped.  History of pulmonary embolism -Off Xarelto. CT angio of chest in this admission did not show any new pulmonary embolism.    Malnutrition - Body mass index is 15 kg/m. Nutrition consulted.  Plan is to discharge home today with home hospice.   Subjective:  Seen and examined this morning.  Unfortunate middle-aged African-American male.  Not in distress at the time of my evaluation.  Agreeable to the plan home with home hospice today.  Discharge Exam:   Vitals:   08/13/19 2220 08/13/19 2224 08/14/19 5188  08/14/19 0824  BP: (!) 121/97 (!) 121/97 107/79   Pulse: (!) 130 83 92 93  Resp:  20 (!)  22 20  Temp:  99.3 F (37.4 C) 98.6 F (37 C)   TempSrc:  Oral Oral   SpO2:  97% 100%   Weight:      Height:        Body mass index is 15 kg/m.  General exam: Appears calm and comfortable at the time of my evaluation Skin: No rashes, lesions or ulcers. HEENT: Atraumatic, normocephalic, supple neck, no obvious bleeding Lungs: Clear to auscultation bilaterally except for mild rales in both bases CVS: Regular rate and rhythm, no murmur GI/Abd soft, nondistended, nontender, bowel sound present CNS: Alert, awake, slow to respond, follows command Psychiatry: Looks depressed Extremities: No pedal edema, no calf tenderness  Discharge Instructions:  Wound care: None Discharge Instructions    Diet general   Complete by: As directed    Increase activity slowly   Complete by: As directed      Follow-up Information    [name], MD Follow up.   Specialty: Oncology Contact information: [place] [address] [phone]          Allergies as of 08/14/2019   No Known Allergies     Medication List    STOP taking these medications   oxyCODONE-acetaminophen 5-325 MG tablet Commonly known as: PERCOCET/ROXICET   rivaroxaban 20 MG Tabs tablet Commonly known as: Xarelto     TAKE these medications   albuterol 108 (90 Base) MCG/ACT inhaler Commonly known as: VENTOLIN HFA Inhale 2 puffs into the lungs every 6 (six) hours as needed for wheezing or shortness of breath.   amoxicillin-clavulanate 400-57 MG/5ML suspension Commonly known as: AUGMENTIN Take 11 mLs (880 mg total) by mouth every 12 (twelve) hours for 5 days.   feeding supplement (ENSURE ENLIVE) Liqd Take 237 mLs by mouth 3 (three) times daily between meals. What changed: when to take this   fluconazole 200 MG tablet Commonly known as: DIFLUCAN Take 1 tablet (200 mg total) by mouth daily for 7 days.   folic acid 1 MG tablet Commonly known as: FOLVITE Take 1 tablet (1 mg total) by  mouth daily.   guaiFENesin-dextromethorphan 100-10 MG/5ML syrup Commonly known as: ROBITUSSIN DM Take 5 mLs by mouth every 4 (four) hours as needed for up to 7 days for cough.   lidocaine 2 % solution Commonly known as: XYLOCAINE Use as directed 15 mLs in the mouth or throat every 4 (four) hours as needed for up to 7 days for mouth pain.   loperamide 2 MG capsule Commonly known as: IMODIUM Take 1 capsule (2 mg total) by mouth 3 (three) times daily as needed for up to 5 days for diarrhea or loose stools.   magic mouthwash Soln Take 5 mLs by mouth 4 (four) times daily as needed for mouth pain.   magic mouthwash w/lidocaine Soln Take 5 mLs by mouth 4 (four) times daily as needed for up to 7 days for mouth pain.   magnesium oxide 400 (241.3 Mg) MG tablet Commonly known as: MAG-OX Take 1 tablet (400 mg total) by mouth 2 (two) times daily.   metoprolol tartrate 25 MG tablet Commonly known as: LOPRESSOR Take 1 tablet (25 mg total) by mouth 2 (two) times daily.   morphine 20 MG/5ML solution Take 0.6 mLs (2.4 mg total) by mouth every 2 (two) hours as needed for up to 7 days for pain.  phenol 1.4 % Liqd Commonly known as: CHLORASEPTIC Use as directed 1 spray in the mouth or throat as needed for throat irritation / pain.   potassium chloride SA 20 MEQ tablet Commonly known as: KLOR-CON Take 1 tablet (20 mEq total) by mouth 2 (two) times daily.   scopolamine 1 MG/3DAYS Commonly known as: Transderm-Scop (1.5 MG) Place 1 patch (1.5 mg total) onto the skin every 3 (three) days for 3 days.   senna-docusate 8.6-50 MG tablet Commonly known as: Senokot-S Take 1 tablet by mouth at bedtime as needed for mild constipation.            Durable Medical Equipment  (From admission, onward)         Start     Ordered   08/14/19 0808  For home use only DME oxygen  Once    Question Answer Comment  Length of Need Lifetime   Mode or (Route) Nasal cannula   Liters per Minute 3     Frequency Continuous (stationary and portable oxygen unit needed)   Oxygen conserving device Yes   Oxygen delivery system Gas      08/14/19 0807   08/14/19 0807  For home use only DME standard manual wheelchair with seat cushion  Once    Comments: Patient suffers from metastatic lung cancer which impairs their ability to perform daily activities like bathing, dressing, feeding and grooming in the home.  A cane will not resolve issue with performing activities of daily living. A wheelchair will allow patient to safely perform daily activities. Patient can safely propel the wheelchair in the home or has a caregiver who can provide assistance. Length of need Lifetime. Accessories: elevating leg rests (ELRs), wheel locks, extensions and anti-tippers.   08/14/19 0807   08/14/19 0806  For home use only DME Walker rolling  Once    Question Answer Comment  Walker: With [place]   Patient needs a walker to treat with the following condition Impaired ambulation      08/14/19 0807          Time coordinating discharge: 35 minutes  The results of significant diagnostics from this hospitalization (including imaging, microbiology, ancillary and laboratory) are listed below for reference.    Procedures and Diagnostic Studies:   CT Angio Chest PE W and/or Wo Contrast  Result Date: 08/11/2019 CLINICAL DATA:  Hemoptysis. Dizziness. History of lung cancer. Concern for pulmonary embolism. Known right lower lobe stage IV adenocarcinoma, status post chemotherapy and radiation therapy in July 2020. EXAM: CT ANGIOGRAPHY CHEST WITH CONTRAST TECHNIQUE: Multidetector CT imaging of the chest was performed using the standard protocol during bolus administration of intravenous contrast. Multiplanar CT image reconstructions and MIPs were obtained to evaluate the vascular anatomy. CONTRAST:  59mL OMNIPAQUE IOHEXOL 350 MG/ML SOLN COMPARISON:  08/11/2019 chest radiograph.  CT chest 07/23/2019. FINDINGS:  Cardiovascular: The quality of this exam for evaluation of pulmonary embolism is good. No evidence of pulmonary embolism. Aortic and branch vessel atherosclerosis. Tortuous thoracic aorta. Moderate cardiomegaly, without pericardial effusion. Left circumflex coronary artery atherosclerosis. Mediastinum/Nodes: Suggestion of soft tissue fullness within the inferior mediastinum, including within the subcarinal space on 79/4. This extends into the peribronchovascular soft tissues surrounding the right lower lobe bronchi, including on 96/4. No well-defined adenopathy. Lungs/Pleura: Similar small right pleural effusion. New trace left pleural fluid. Similar mass-effect upon right middle and right lower lobe bronchi. Biapical pleuroparenchymal scarring. Pulmonary nodules, within example 7 mm right apical nodule on 51/10 measuring similar on the  prior CT. Redemonstration of right lower lung consolidation, ground-glass, and airspace disease. This is slightly increased, including within the posterolateral right middle and anterolateral right lower lobes. New left lower lobe airspace and ground-glass opacity. Underlying centrilobular and paraseptal emphysema. Upper Abdomen: Possible trace perihepatic ascites. Normal imaged portions of the spleen, stomach, pancreas, adrenal glands, kidneys. Musculoskeletal: No acute osseous abnormality. Review of the MIP images confirms the above findings. IMPRESSION: 1.  No evidence of pulmonary embolism. 2. Worsened right lower lung aeration in the setting of consolidation, airspace, and ground-glass opacity. New left lower lobe consolidation and airspace disease. Presuming no recent radiation therapy, considerations include progressive infection/aspiration or tumor in this patient with a history of stage IV adenocarcinoma. 3. Developing soft tissue fullness within the inferior mediastinum and right infrahilar region. Similarly, this could be radiation induced or represent tumor spread. No  well-defined adenopathy. 4. Similar small right and development of trace left pleural fluid. 5. Aortic atherosclerosis (ICD10-I70.0), coronary artery atherosclerosis and emphysema (ICD10-J43.9). 6. Pulmonary nodules, as before. 7. Consider 6-12 week follow-up CT, possibly after antibiotic therapy. Electronically Signed   By: Abigail Miyamoto M.D.   On: 08/11/2019 18:35   DG Chest Portable 1 View  Result Date: 08/11/2019 CLINICAL DATA:  Cough. History of lung cancer and COPD. Pneumonia. EXAM: PORTABLE CHEST 1 VIEW COMPARISON:  August 03, 2019 FINDINGS: Effusion and opacity remains in the right lower lung, mildly worsened in the interval. The left lung is clear. No pneumothorax. No change in the cardiomediastinal silhouette. IMPRESSION: Mild worsening of right-sided effusion and underlying opacity. Recommend follow-up to resolution. Electronically Signed   By: Dorise Bullion III M.D   On: 08/11/2019 16:09     Labs:   Basic Metabolic Panel: Recent Labs  Lab 08/09/19 0250 08/09/19 0250 08/10/19 0327 08/10/19 0327 08/11/19 1703 08/11/19 1703 08/12/19 0017 08/13/19 0456  NA 138  --  139  --  140  --  138 138  K 3.9   < > 3.7   < > 3.5   < > 3.3* 3.1*  CL 97*  --  100  --  99  --  98 97*  CO2 32  --  31  --  31  --  31 30  GLUCOSE 88  --  87  --  82  --  96 83  BUN 26*  --  22*  --  19  --  16 15  CREATININE 1.29*  --  1.09  --  1.11  --  1.14 1.28*  CALCIUM 8.0*  --  8.1*  --  7.9*  --  7.5* 7.2*   < > = values in this interval not displayed.   GFR Estimated Creatinine Clearance: 46 mL/min (A) (by C-G formula based on SCr of 1.28 mg/dL (H)). Liver Function Tests: Recent Labs  Lab 08/12/19 0017  AST 30  ALT 13  ALKPHOS 80  BILITOT 0.6  PROT 6.2*  ALBUMIN 1.9*   No results for input(s): LIPASE, AMYLASE in the last 168 hours. No results for input(s): AMMONIA in the last 168 hours. Coagulation profile Recent Labs  Lab 08/12/19 0017  INR 1.5*    CBC: Recent Labs  Lab  08/08/19 0256 08/08/19 0256 08/09/19 0250 08/10/19 0327 08/11/19 1703 08/12/19 0017 08/13/19 0456  WBC 2.2*   < > 5.0 8.3 9.1 9.5 9.3  NEUTROABS 1.3*  --  3.5 5.9 5.9  --  5.6  HGB 8.0*   < > 8.1* 8.1* 7.4* 7.1* 11.5*  HCT 25.1*   < > 25.6* 26.9* 23.7* 22.9* 34.6*  MCV 86.0   < > 87.7 89.4 87.5 87.7 88.5  PLT 19*   < > 13* 10* 11* 13* 42*   < > = values in this interval not displayed.   Cardiac Enzymes: No results for input(s): CKTOTAL, CKMB, CKMBINDEX, TROPONINI in the last 168 hours. BNP: Invalid input(s): POCBNP CBG: No results for input(s): GLUCAP in the last 168 hours. D-Dimer No results for input(s): DDIMER in the last 72 hours. Hgb A1c No results for input(s): HGBA1C in the last 72 hours. Lipid Profile No results for input(s): CHOL, HDL, LDLCALC, TRIG, CHOLHDL, LDLDIRECT in the last 72 hours. Thyroid function studies No results for input(s): TSH, T4TOTAL, T3FREE, THYROIDAB in the last 72 hours.  Invalid input(s): FREET3 Anemia work up Recent Labs    08/12/19 1307  RETICCTPCT <0.4*   Microbiology Recent Results (from the past 240 hour(s))  SARS CORONAVIRUS 2 (TAT 6-24 HRS) Nasopharyngeal Nasopharyngeal Swab     Status: None   Collection Time: 08/11/19  7:59 PM   Specimen: Nasopharyngeal Swab  Result Value Ref Range Status   SARS Coronavirus 2 NEGATIVE NEGATIVE Final    Comment: (NOTE) SARS-CoV-2 target nucleic acids are NOT DETECTED. The SARS-CoV-2 RNA is generally detectable in upper and lower respiratory specimens during the acute phase of infection. Negative results do not preclude SARS-CoV-2 infection, do not rule out co-infections with other pathogens, and should not be used as the sole basis for treatment or other patient management decisions. Negative results must be combined with clinical observations, patient history, and epidemiological information. The expected result is Negative. Fact Sheet for  Patients: SugarRoll.be Fact Sheet for Healthcare Providers: https://www.woods-mathews.com/ This test is not yet approved or cleared by the [place] FDA and  has been authorized for detection and/or diagnosis of SARS-CoV-2 by FDA under an Emergency Use Authorization (EUA). This EUA will remain  in effect (meaning this test can be used) for the duration of the COVID-19 declaration under Section 56 4(b)(1) of the Act, 21 U.S.C. section 360bbb-3(b)(1), unless the authorization is terminated or revoked sooner. Performed at [facility], [place] [address] [place], [place] [address]     Please note: You were cared for by a hospitalist during your hospital stay. Once you are discharged, your primary care physician will handle any further medical issues. Please note that NO REFILLS for any discharge medications will be authorized once you are discharged, as it is imperative that you return to your primary care physician (or establish a relationship with a primary care physician if you do not have one) for your post hospital discharge needs so that they can reassess your need for medications and monitor your lab values.  Signed: Marlowe Aschoff Jaque Dacy  [facility] 08/14/2019, 10:45 AM

## 2019-08-15 ENCOUNTER — Telehealth: Payer: Self-pay | Admitting: Medical Oncology

## 2019-08-15 NOTE — Telephone Encounter (Signed)
Called hospice to f/u with pt for mouth sores and pain medication. They will see pt today.

## 2019-08-21 ENCOUNTER — Other Ambulatory Visit: Payer: Medicaid Other

## 2019-08-28 ENCOUNTER — Other Ambulatory Visit: Payer: Medicaid Other

## 2019-09-03 ENCOUNTER — Other Ambulatory Visit: Payer: Self-pay | Admitting: Medical Oncology

## 2019-09-04 ENCOUNTER — Other Ambulatory Visit: Payer: Medicaid Other

## 2019-09-04 ENCOUNTER — Inpatient Hospital Stay: Payer: Medicaid Other

## 2019-09-04 ENCOUNTER — Inpatient Hospital Stay: Payer: Medicaid Other | Admitting: Internal Medicine

## 2019-10-12 DIAGNOSIS — C3491 Malignant neoplasm of unspecified part of right bronchus or lung: Secondary | ICD-10-CM | POA: Diagnosis not present

## 2019-10-14 DIAGNOSIS — C3491 Malignant neoplasm of unspecified part of right bronchus or lung: Secondary | ICD-10-CM | POA: Diagnosis not present

## 2019-10-16 DIAGNOSIS — C3491 Malignant neoplasm of unspecified part of right bronchus or lung: Secondary | ICD-10-CM | POA: Diagnosis not present

## 2019-10-18 ENCOUNTER — Telehealth: Payer: Self-pay | Admitting: Medical Oncology

## 2019-10-18 NOTE — Telephone Encounter (Signed)
Yes.  I will sign it.  They will need to bring it early in the morning.

## 2019-10-18 NOTE — Telephone Encounter (Signed)
Left message at [facility] to bring death certificate by 8333 am tomorrow and Dr. Julien Nordmann will sign it.

## 2019-10-18 NOTE — Telephone Encounter (Signed)
Tyler [name] sign his death certificate?

## 2019-11-10 DEATH — deceased

## 2020-03-26 IMAGING — CT CT CHEST W/ CM
2 of 3 series · 12 of 36 positions shown, 15 images · IV contrast (OMNIPAQUE)
Comparison: CT chest 01/08/2019 and PET-CT 12/27/2018
COMPARISON: CT chest 01/08/2019 and PET-CT 12/27/2018

Addendum:
CLINICAL DATA: Restaging right lung cancer. Right supraclavicular
pain and cough.

EXAM:
CT CHEST WITH CONTRAST
TECHNIQUE: Multidetector CT imaging of the chest was performed during
intravenous contrast administration.
CONTRAST:  75mL OMNIPAQUE IOHEXOL 300 MG/ML  SOLN

[Series 2: axial st · axial · 0.62mm/px · z∈[-296,-38]mm · 9 of 153 slices shown, 12 images]
[im 12/153  mediastinal]
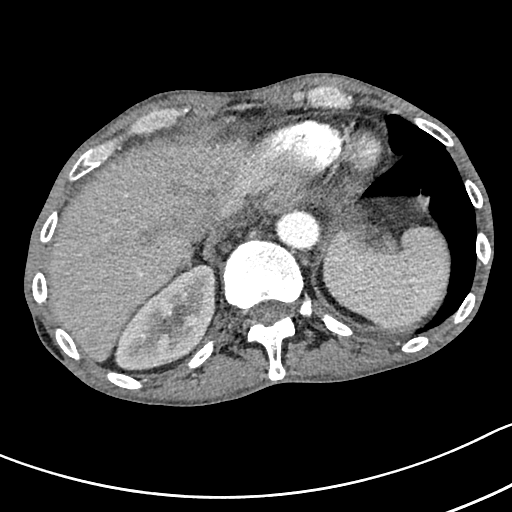
[im 12/153  lung]
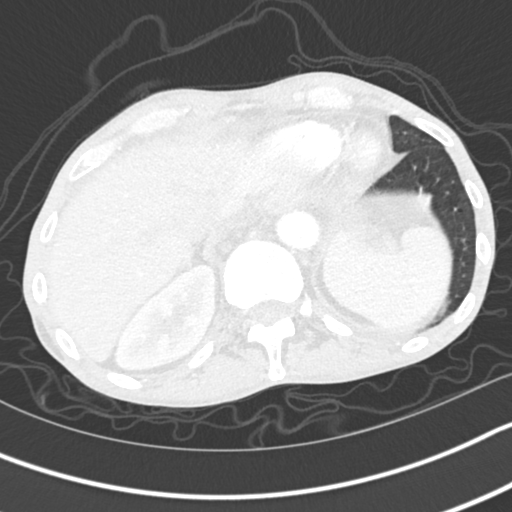
[im 29/153  lung]
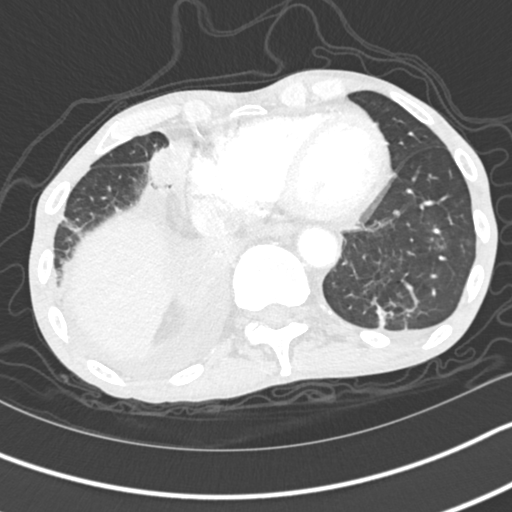
[im 46/153  lung]
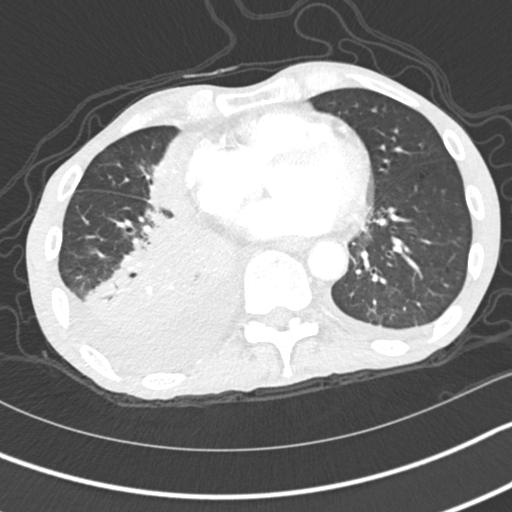
[im 62/153  lung]
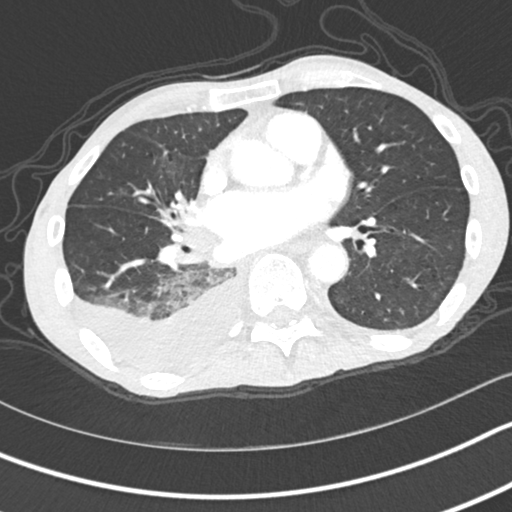
[im 79/153  mediastinal]
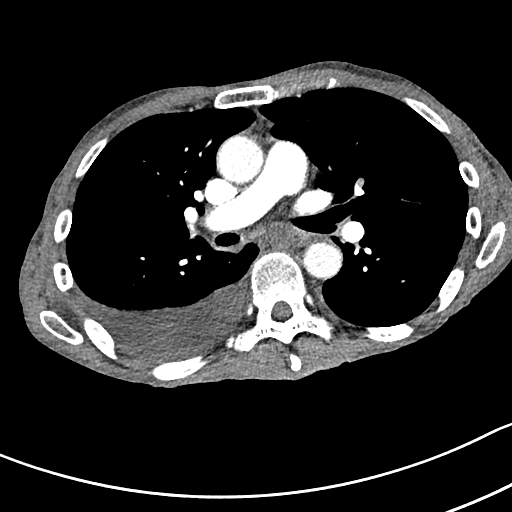
[im 79/153  lung]
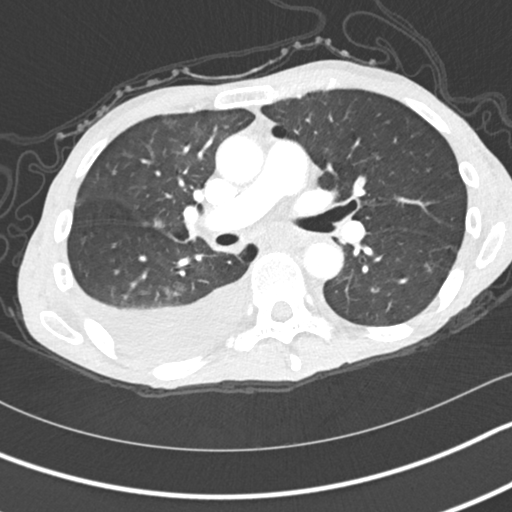
[im 91/153  lung]
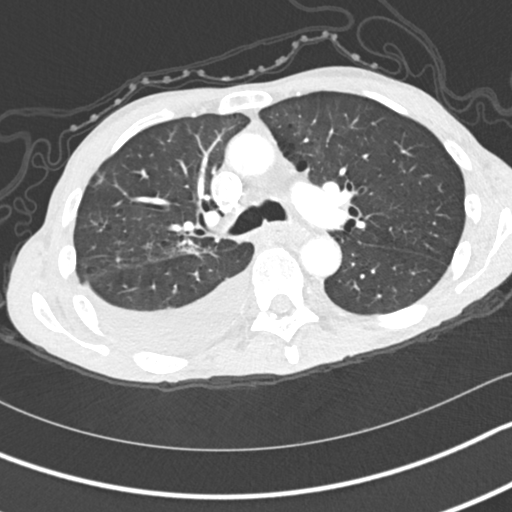
[im 107/153  lung]
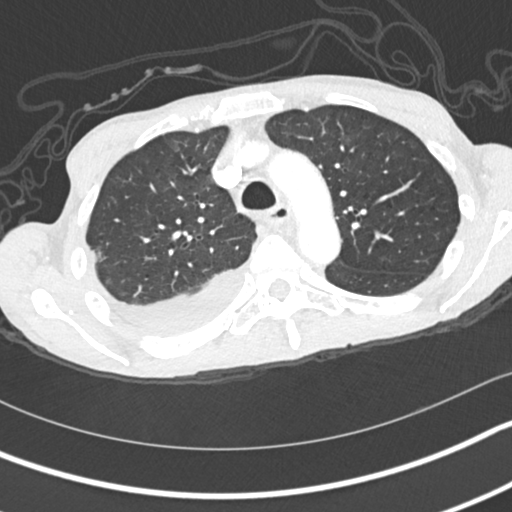
[im 124/153  lung]
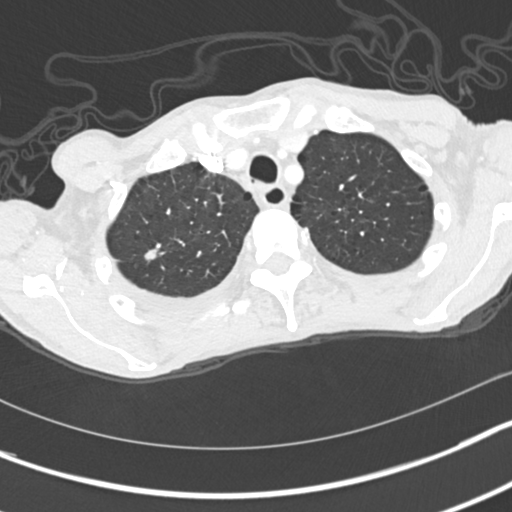
[im 141/153  mediastinal]
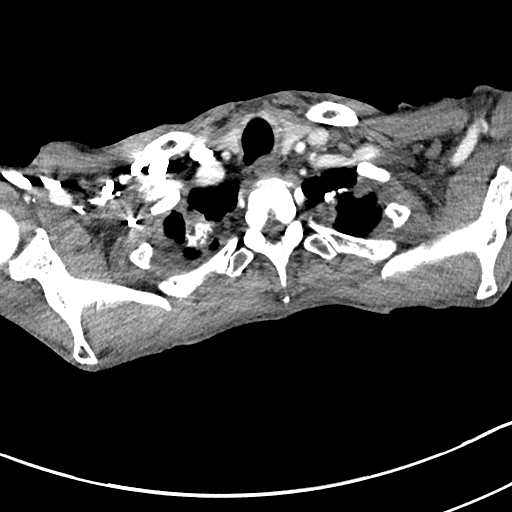
[im 141/153  lung]
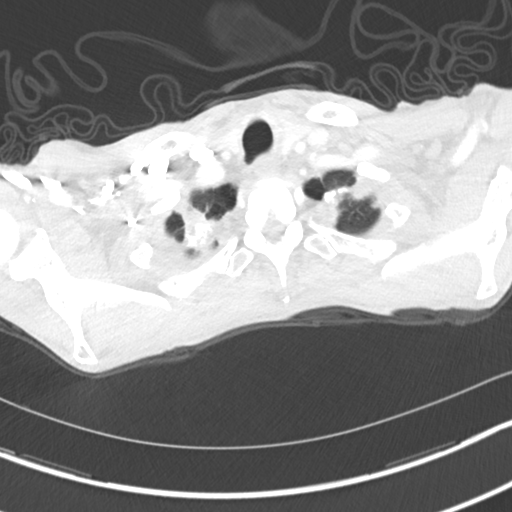

[Series 6: coronal · coronal · 0.64mm/px · 3 of 96 slices shown]
[im 20/96  lung]
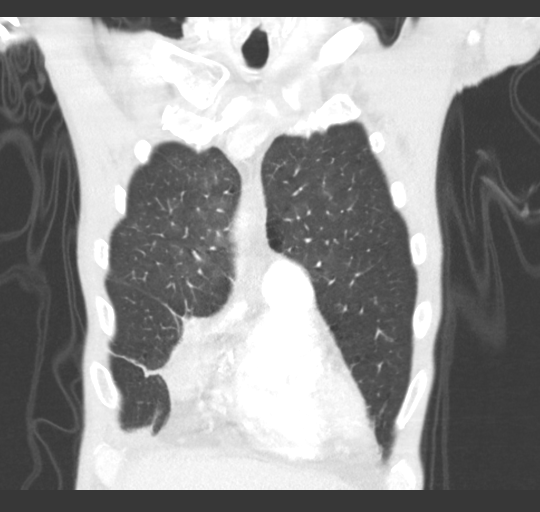
[im 39/96  lung]
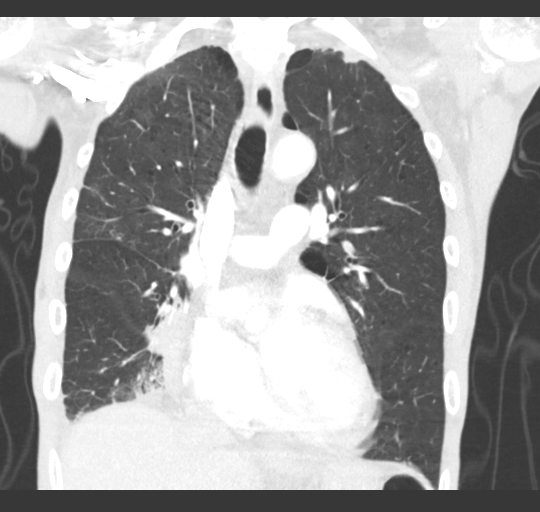
[im 58/96  lung]
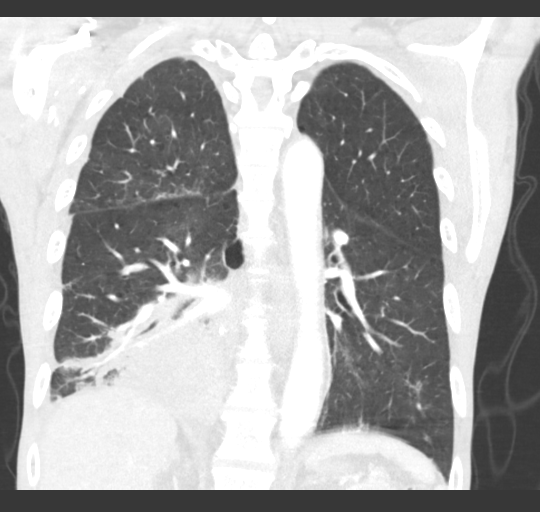

[12 of 36 positions shown; findings below may reference images not displayed]

FINDINGS: Cardiovascular: Normal heart size. No pericardial effusion. Mild
aortic atherosclerosis. RCA and left circumflex coronary artery
calcifications. There is a left upper lobe segmental branch
pulmonary artery filling defect, image 76/2. This is compatible with
acute pulmonary embolus. Additional PE identified within a segmental
branch of the left lower lobe pulmonary artery, image 101/2

Mediastinum/Nodes: Normal appearance of the thyroid gland. The
trachea appears patent and is midline. Normal appearance of the
esophagus. No mediastinal or left hilar adenopathy. 1 cm right hilar
lymph node is identified. Increased from 0.7 cm previously.

Lungs/Pleura: Centrilobular and paraseptal emphysema. New moderate
right pleural effusion and small left pleural effusion. Significant
interval increase in tumor burden within the right lower lobe which
measures 8.6 x 6.7 by 6.4 cm, image 112/2 and image 52/7. On the
sagittal images this has a AP diameter 9.7 cm spanning the right
lower lobe and right middle lobe. Adjacent ground-glass attenuation
and interlobular septal thickening noted concerning for lymphangitic
spread. On the previous exam this lesion was limited to the central
right lower lobe measuring 1.9 x 2.5 by 1.5 cm. Tumor extension into
the right infrahilar region with encasement of the right lower lobe
and right middle lobe bronchi identified with postobstructive
pneumonitis. New tumor within the central right middle lobe measures
4.4 x 2.5 by 5.4 cm, image 117/2 and image [DATE].

Solid nodule within the right upper lobe measures 8 mm, image [DATE].
Unchanged from previous exam.

Thin walled cavitary nodule within the posterior left upper lobe
measures 4 mm, image 43/5. Previously this measured 6 mm.

Unchanged, ill-defined part solid nodule in the lateral aspect of
the superior segment of left lower lobe measuring 1 cm, image 78/5.

Triangular-shaped, subpleural nodule in the posterior left lower
lobe is new measuring 1.4 cm and may represent an area of pulmonary
infarct is this is in the distribution of the left lower lobe
pulmonary embolus.

Upper Abdomen: No acute abnormality.

Musculoskeletal: No acute or aggressive bone lesions identified.
IMPRESSION: 1. Considerable increase in tumor burden involving the left lower
lobe and now the right middle lobe. There is overlying ground-glass
attenuation and interlobular septal thickening concerning for
lymphangitic spread of disease.
2. New bilateral pleural effusions right greater than left.
3. Additional small solid and sub solid nodules involving the right
upper lobe and left lung are not significantly changed in the
interval.
4. Left upper lobe and left lower lobe segmental branch acute
pulmonary artery emboli.

ADDENDUM:
Critical Value/emergent results were called by telephone at the time
of interpretation on 04/06/2019 at [DATE] to the [HOSPITAL] [REDACTED] triage nurse Borovik Cherenkova, who verbally
acknowledged these results.

*** End of Addendum ***
FINDINGS: Cardiovascular: Normal heart size. No pericardial effusion. Mild
aortic atherosclerosis. RCA and left circumflex coronary artery
calcifications. There is a left upper lobe segmental branch
pulmonary artery filling defect, image 76/2. This is compatible with
acute pulmonary embolus. Additional PE identified within a segmental
branch of the left lower lobe pulmonary artery, image 101/2

Mediastinum/Nodes: Normal appearance of the thyroid gland. The
trachea appears patent and is midline. Normal appearance of the
esophagus. No mediastinal or left hilar adenopathy. 1 cm right hilar
lymph node is identified. Increased from 0.7 cm previously.

Lungs/Pleura: Centrilobular and paraseptal emphysema. New moderate
right pleural effusion and small left pleural effusion. Significant
interval increase in tumor burden within the right lower lobe which
measures 8.6 x 6.7 by 6.4 cm, image 112/2 and image 52/7. On the
sagittal images this has a AP diameter 9.7 cm spanning the right
lower lobe and right middle lobe. Adjacent ground-glass attenuation
and interlobular septal thickening noted concerning for lymphangitic
spread. On the previous exam this lesion was limited to the central
right lower lobe measuring 1.9 x 2.5 by 1.5 cm. Tumor extension into
the right infrahilar region with encasement of the right lower lobe
and right middle lobe bronchi identified with postobstructive
pneumonitis. New tumor within the central right middle lobe measures
4.4 x 2.5 by 5.4 cm, image 117/2 and image [DATE].

Solid nodule within the right upper lobe measures 8 mm, image [DATE].
Unchanged from previous exam.

Thin walled cavitary nodule within the posterior left upper lobe
measures 4 mm, image 43/5. Previously this measured 6 mm.

Unchanged, ill-defined part solid nodule in the lateral aspect of
the superior segment of left lower lobe measuring 1 cm, image 78/5.

Triangular-shaped, subpleural nodule in the posterior left lower
lobe is new measuring 1.4 cm and may represent an area of pulmonary
infarct is this is in the distribution of the left lower lobe
pulmonary embolus.

Upper Abdomen: No acute abnormality.

Musculoskeletal: No acute or aggressive bone lesions identified.
IMPRESSION: 1. Considerable increase in tumor burden involving the left lower
lobe and now the right middle lobe. There is overlying ground-glass
attenuation and interlobular septal thickening concerning for
lymphangitic spread of disease.
2. New bilateral pleural effusions right greater than left.
3. Additional small solid and sub solid nodules involving the right
upper lobe and left lung are not significantly changed in the
interval.
4. Left upper lobe and left lower lobe segmental branch acute
pulmonary artery emboli.

## 2020-03-26 IMAGING — CR DG CHEST 2V
2 series · 2 of 2 positions shown · non-contrast
Comparison: Radiograph January 11, 2019.

CLINICAL DATA: Chest pain.

EXAM:
CHEST - 2 VIEW

[w chest lat]
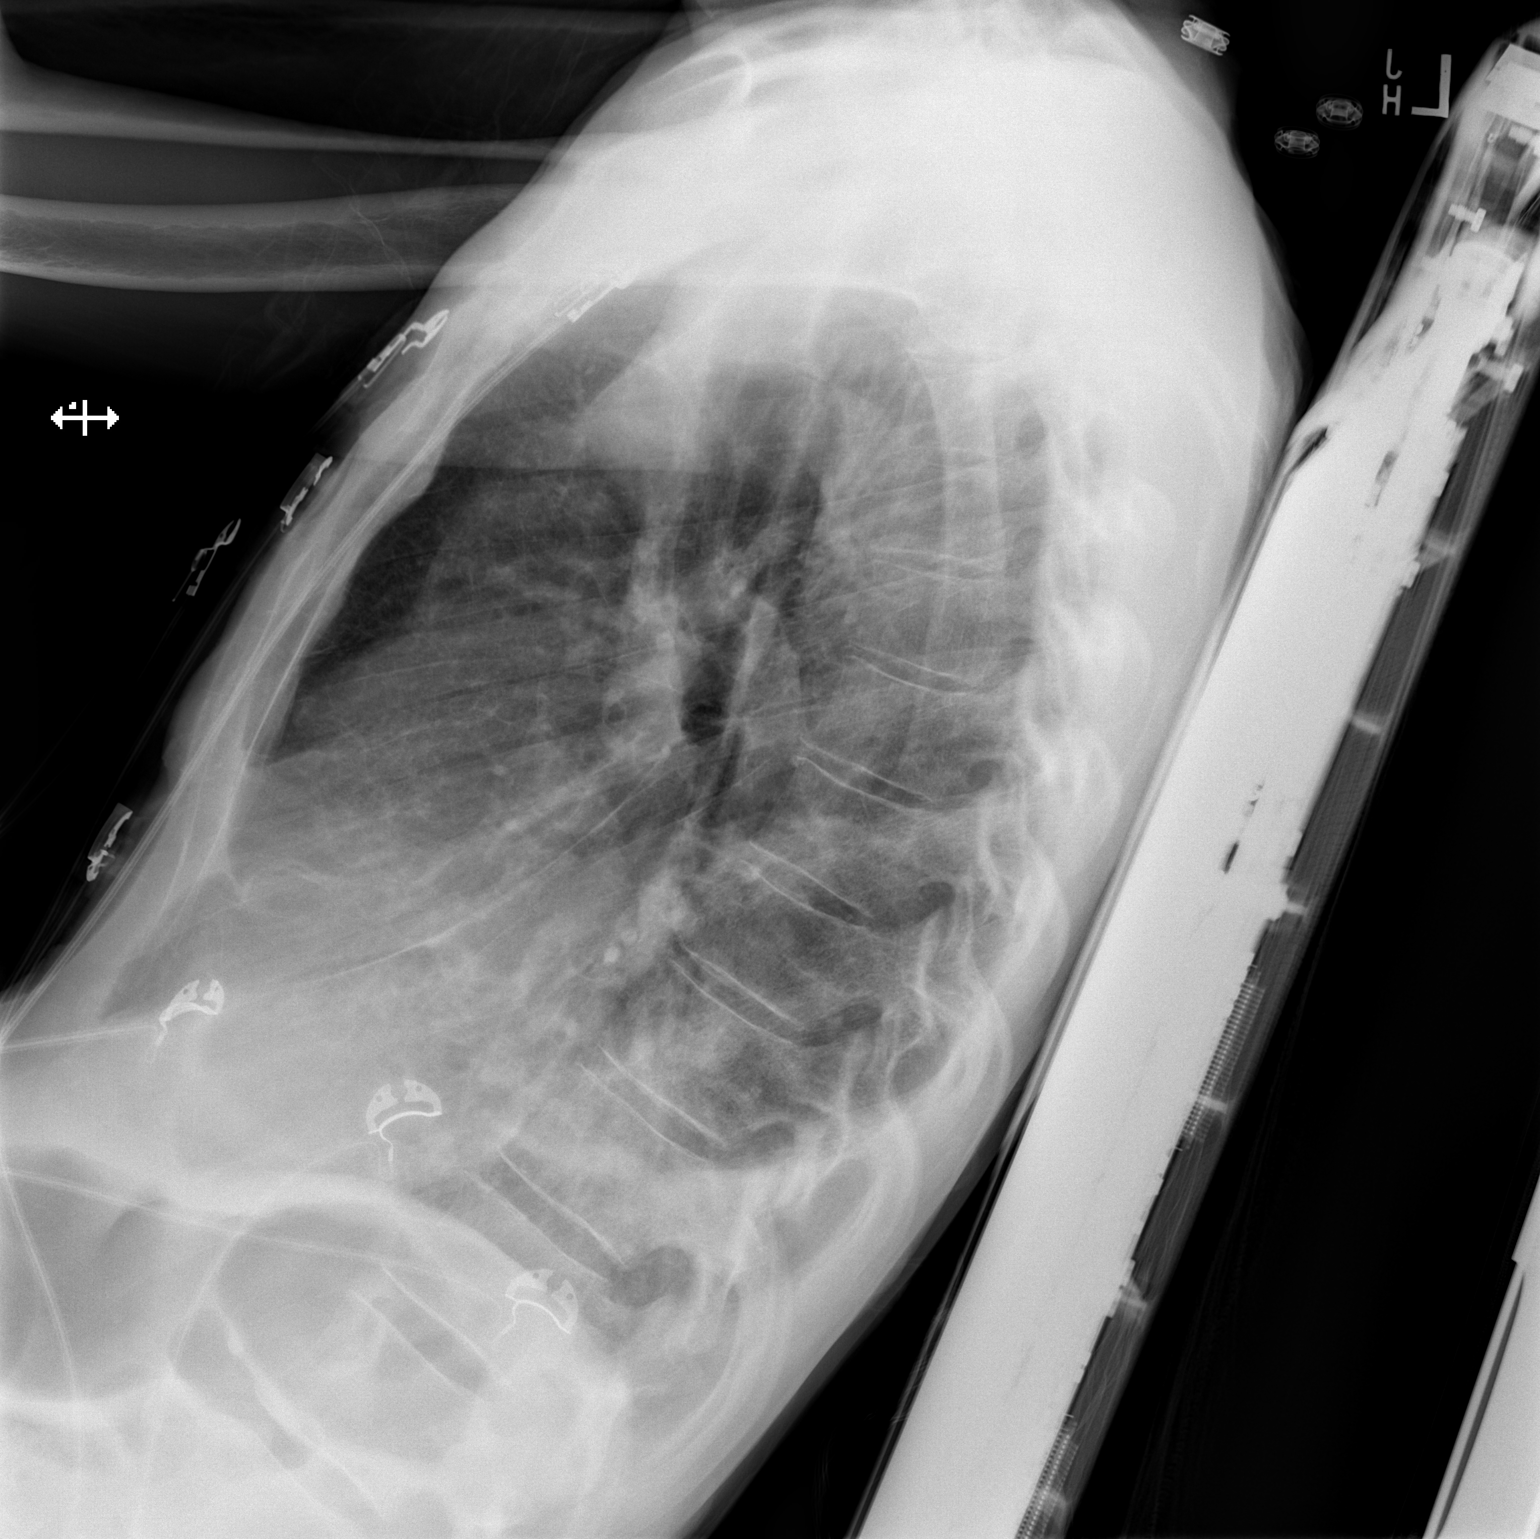

[x chest ap]
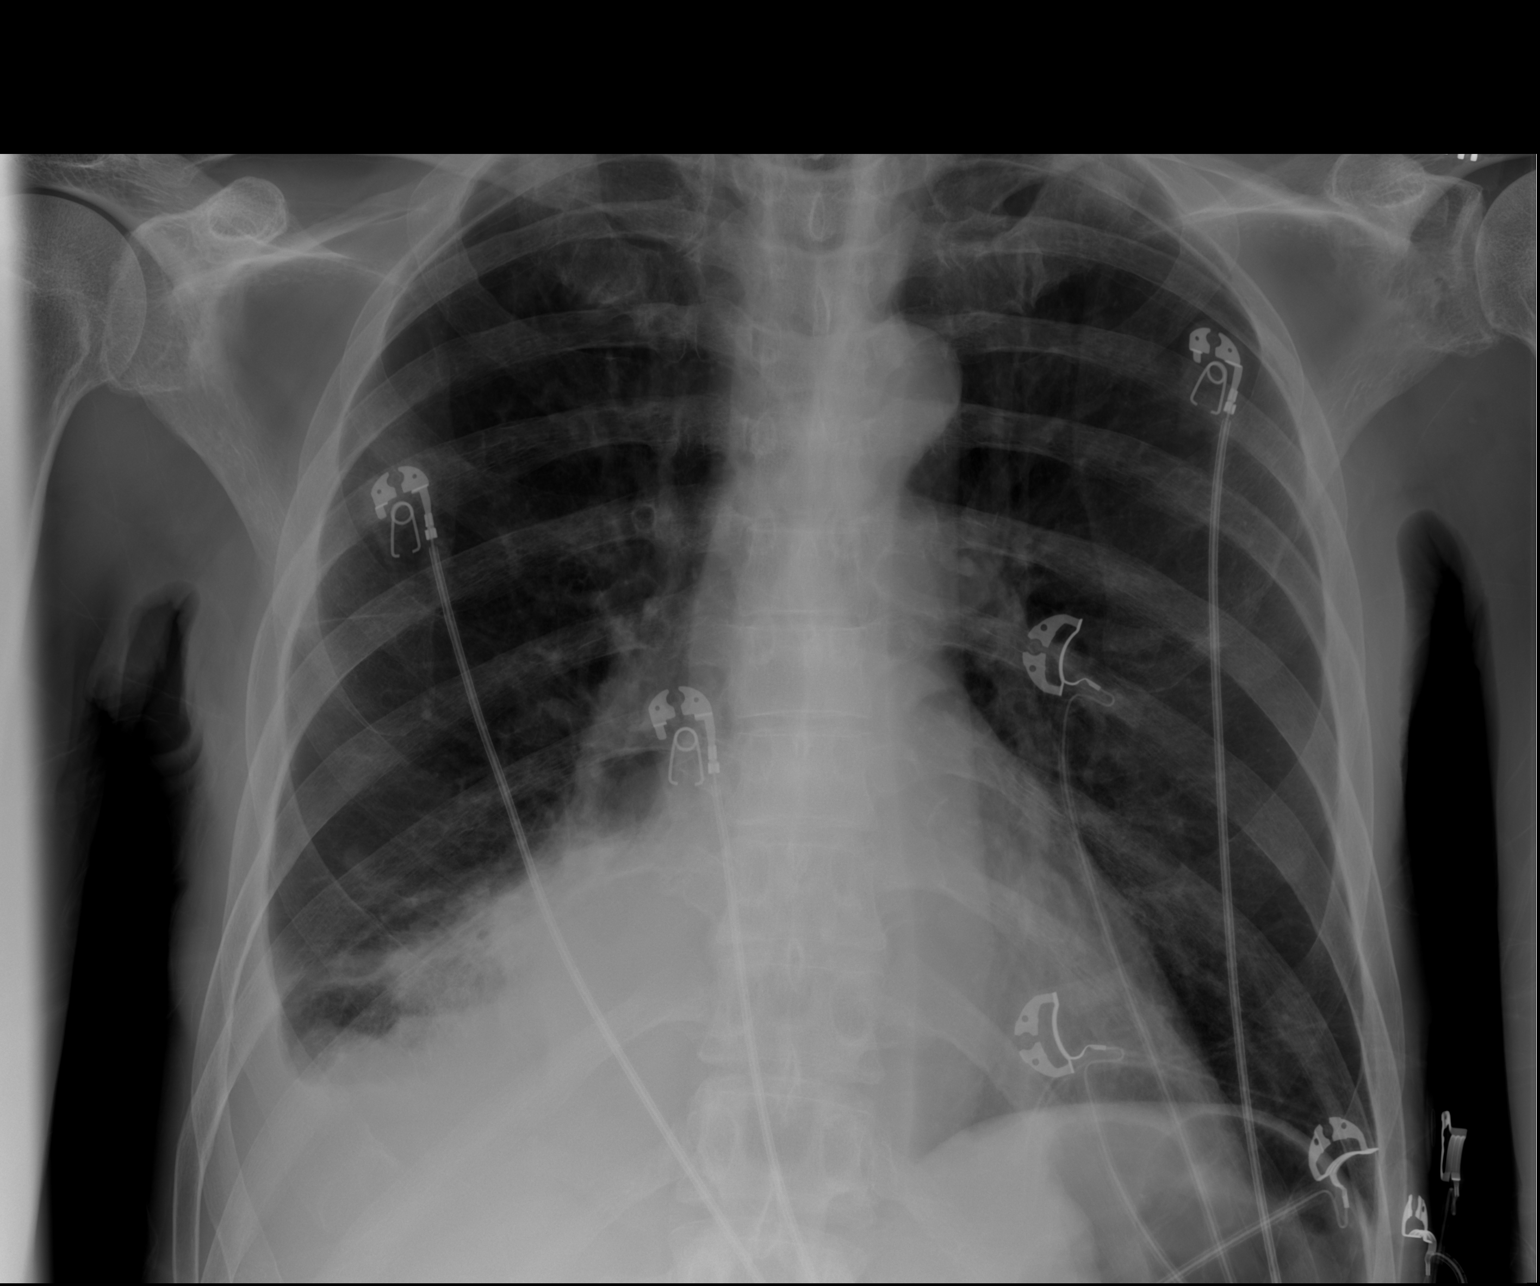

[2 of 2 positions shown; findings below may reference images not displayed]

FINDINGS: The heart size and mediastinal contours are within normal limits. No
pneumothorax is noted. Left lung is clear. Probable lower lobe
atelectasis or pneumonia is noted with small right pleural effusion.
The visualized skeletal structures are unremarkable.
IMPRESSION: Right lower lobe opacity is noted most consistent with atelectasis
or infiltrate with associated pleural effusion. Followup PA and
lateral chest X-ray is recommended in 3-4 weeks following trial of
antibiotic therapy to ensure resolution and exclude underlying
malignancy.

## 2020-07-31 IMAGING — DX DG CHEST 1V PORT
1 series · 1 of 1 positions shown · non-contrast
Comparison: August 03, 2019

CLINICAL DATA: Cough. History of lung cancer and COPD. Pneumonia.

EXAM:
PORTABLE CHEST 1 VIEW

[chest ap]
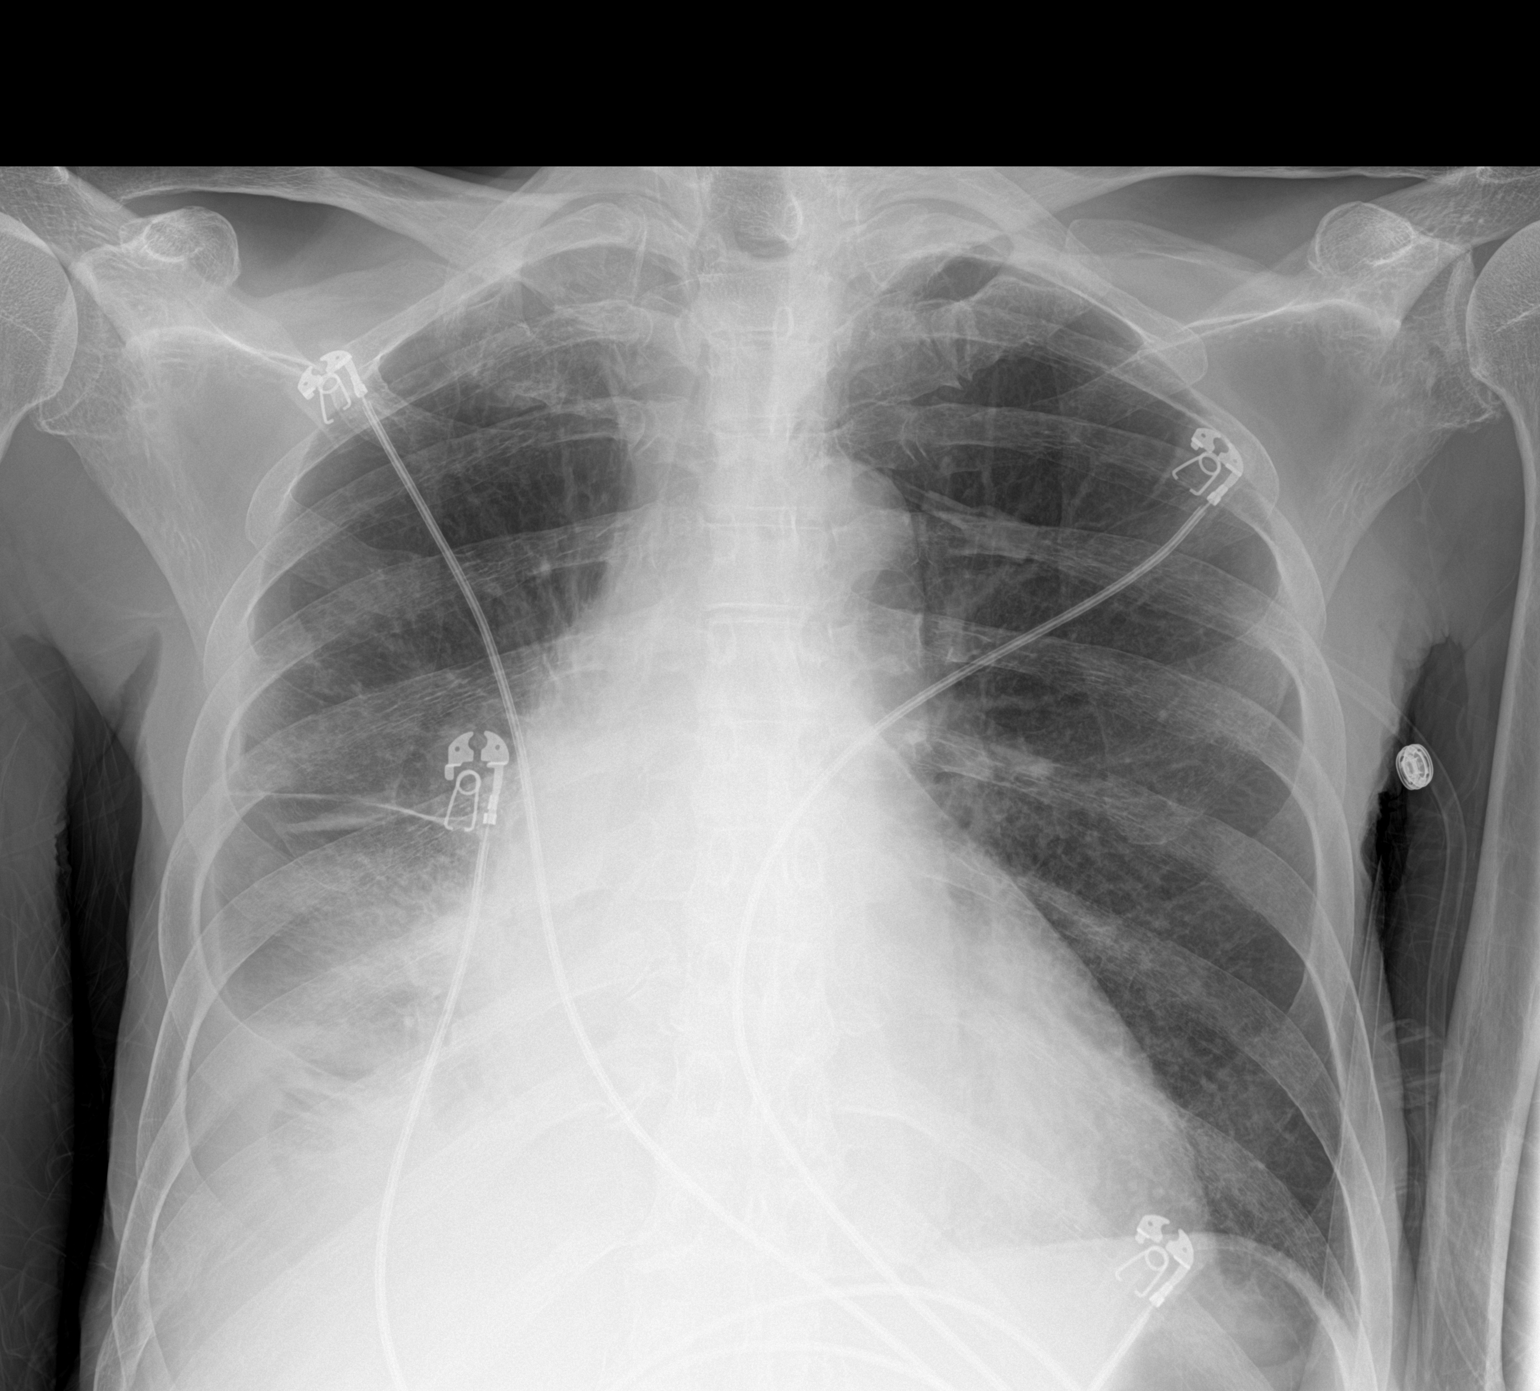

[1 of 1 positions shown; findings below may reference images not displayed]

FINDINGS: Effusion and opacity remains in the right lower lung, mildly
worsened in the interval. The left lung is clear. No pneumothorax.
No change in the cardiomediastinal silhouette.
IMPRESSION: Mild worsening of right-sided effusion and underlying opacity.
Recommend follow-up to resolution.

## 2023-05-13 DEATH — deceased

## 2023-06-01 NOTE — Telephone Encounter (Signed)
Telephone call  

## 2023-06-02 NOTE — Telephone Encounter (Signed)
Telephone call
